# Patient Record
Sex: Female | Born: 1935
Health system: Southern US, Community
[De-identification: ages and names within clinical notes are randomized; demographics above are authoritative.]

## PROBLEM LIST (undated history)

## (undated) DIAGNOSIS — D649 Anemia, unspecified: Secondary | ICD-10-CM

## (undated) DIAGNOSIS — M1990 Unspecified osteoarthritis, unspecified site: Secondary | ICD-10-CM

## (undated) DIAGNOSIS — E785 Hyperlipidemia, unspecified: Secondary | ICD-10-CM

## (undated) DIAGNOSIS — I10 Essential (primary) hypertension: Secondary | ICD-10-CM

## (undated) DIAGNOSIS — K219 Gastro-esophageal reflux disease without esophagitis: Secondary | ICD-10-CM

## (undated) DIAGNOSIS — T7840XA Allergy, unspecified, initial encounter: Secondary | ICD-10-CM

## (undated) HISTORY — PX: APPENDECTOMY: SHX54

## (undated) HISTORY — DX: Allergy, unspecified, initial encounter: T78.40XA

## (undated) HISTORY — DX: Anemia, unspecified: D64.9

## (undated) HISTORY — DX: Essential (primary) hypertension: I10

## (undated) HISTORY — DX: Hyperlipidemia, unspecified: E78.5

## (undated) HISTORY — DX: Gastro-esophageal reflux disease without esophagitis: K21.9

## (undated) HISTORY — PX: BACK SURGERY: SHX140

---

## 1998-08-02 ENCOUNTER — Other Ambulatory Visit: Admission: RE | Admit: 1998-08-02 | Discharge: 1998-08-02 | Payer: Self-pay | Admitting: Family Medicine

## 1999-08-01 ENCOUNTER — Encounter: Admission: RE | Admit: 1999-08-01 | Discharge: 1999-08-01 | Payer: Self-pay | Admitting: Family Medicine

## 1999-08-01 ENCOUNTER — Encounter: Payer: Self-pay | Admitting: Family Medicine

## 1999-08-21 ENCOUNTER — Encounter: Payer: Self-pay | Admitting: Family Medicine

## 1999-08-21 ENCOUNTER — Encounter: Admission: RE | Admit: 1999-08-21 | Discharge: 1999-08-21 | Payer: Self-pay | Admitting: Family Medicine

## 2000-08-03 ENCOUNTER — Encounter: Admission: RE | Admit: 2000-08-03 | Discharge: 2000-08-03 | Payer: Self-pay | Admitting: Family Medicine

## 2000-08-03 ENCOUNTER — Encounter: Payer: Self-pay | Admitting: Family Medicine

## 2000-08-14 ENCOUNTER — Other Ambulatory Visit: Admission: RE | Admit: 2000-08-14 | Discharge: 2000-08-14 | Payer: Self-pay | Admitting: Family Medicine

## 2001-08-05 ENCOUNTER — Encounter: Admission: RE | Admit: 2001-08-05 | Discharge: 2001-08-05 | Payer: Self-pay | Admitting: Family Medicine

## 2001-08-05 ENCOUNTER — Encounter: Payer: Self-pay | Admitting: Family Medicine

## 2001-08-20 ENCOUNTER — Other Ambulatory Visit: Admission: RE | Admit: 2001-08-20 | Discharge: 2001-08-20 | Payer: Self-pay | Admitting: Family Medicine

## 2002-08-16 ENCOUNTER — Encounter: Payer: Self-pay | Admitting: Family Medicine

## 2002-08-16 ENCOUNTER — Encounter: Admission: RE | Admit: 2002-08-16 | Discharge: 2002-08-16 | Payer: Self-pay | Admitting: Family Medicine

## 2002-08-19 ENCOUNTER — Other Ambulatory Visit: Admission: RE | Admit: 2002-08-19 | Discharge: 2002-08-19 | Payer: Self-pay | Admitting: Family Medicine

## 2003-06-22 ENCOUNTER — Inpatient Hospital Stay (HOSPITAL_COMMUNITY): Admission: EM | Admit: 2003-06-22 | Discharge: 2003-06-25 | Payer: Self-pay | Admitting: Emergency Medicine

## 2003-06-23 ENCOUNTER — Encounter: Payer: Self-pay | Admitting: Internal Medicine

## 2003-08-17 ENCOUNTER — Encounter: Admission: RE | Admit: 2003-08-17 | Discharge: 2003-08-17 | Payer: Self-pay | Admitting: Family Medicine

## 2003-08-24 ENCOUNTER — Other Ambulatory Visit: Admission: RE | Admit: 2003-08-24 | Discharge: 2003-08-24 | Payer: Self-pay | Admitting: Family Medicine

## 2003-09-01 ENCOUNTER — Encounter: Payer: Self-pay | Admitting: Internal Medicine

## 2003-09-04 ENCOUNTER — Encounter: Admission: RE | Admit: 2003-09-04 | Discharge: 2003-09-04 | Payer: Self-pay | Admitting: Family Medicine

## 2003-09-30 ENCOUNTER — Emergency Department (HOSPITAL_COMMUNITY): Admission: EM | Admit: 2003-09-30 | Discharge: 2003-09-30 | Payer: Self-pay | Admitting: Emergency Medicine

## 2004-08-20 ENCOUNTER — Encounter: Admission: RE | Admit: 2004-08-20 | Discharge: 2004-08-20 | Payer: Self-pay | Admitting: Family Medicine

## 2004-09-05 ENCOUNTER — Other Ambulatory Visit: Admission: RE | Admit: 2004-09-05 | Discharge: 2004-09-05 | Payer: Self-pay | Admitting: Family Medicine

## 2005-03-31 ENCOUNTER — Encounter: Admission: RE | Admit: 2005-03-31 | Discharge: 2005-03-31 | Payer: Self-pay | Admitting: Family Medicine

## 2005-04-14 ENCOUNTER — Encounter: Admission: RE | Admit: 2005-04-14 | Discharge: 2005-04-14 | Payer: Self-pay | Admitting: Family Medicine

## 2005-05-02 ENCOUNTER — Encounter: Admission: RE | Admit: 2005-05-02 | Discharge: 2005-05-02 | Payer: Self-pay | Admitting: Family Medicine

## 2005-05-16 ENCOUNTER — Encounter: Admission: RE | Admit: 2005-05-16 | Discharge: 2005-05-16 | Payer: Self-pay | Admitting: Family Medicine

## 2005-08-22 ENCOUNTER — Encounter: Admission: RE | Admit: 2005-08-22 | Discharge: 2005-08-22 | Payer: Self-pay | Admitting: Family Medicine

## 2005-09-09 ENCOUNTER — Other Ambulatory Visit: Admission: RE | Admit: 2005-09-09 | Discharge: 2005-09-09 | Payer: Self-pay | Admitting: Family Medicine

## 2006-08-31 ENCOUNTER — Encounter: Admission: RE | Admit: 2006-08-31 | Discharge: 2006-08-31 | Payer: Self-pay | Admitting: Family Medicine

## 2006-10-12 ENCOUNTER — Other Ambulatory Visit: Admission: RE | Admit: 2006-10-12 | Discharge: 2006-10-12 | Payer: Self-pay | Admitting: Family Medicine

## 2007-09-01 ENCOUNTER — Encounter: Admission: RE | Admit: 2007-09-01 | Discharge: 2007-09-01 | Payer: Self-pay | Admitting: Family Medicine

## 2007-09-09 ENCOUNTER — Encounter: Admission: RE | Admit: 2007-09-09 | Discharge: 2007-09-09 | Payer: Self-pay | Admitting: Family Medicine

## 2008-06-28 ENCOUNTER — Encounter (INDEPENDENT_AMBULATORY_CARE_PROVIDER_SITE_OTHER): Payer: Self-pay | Admitting: *Deleted

## 2008-09-15 ENCOUNTER — Encounter: Admission: RE | Admit: 2008-09-15 | Discharge: 2008-09-15 | Payer: Self-pay | Admitting: Internal Medicine

## 2009-01-06 ENCOUNTER — Ambulatory Visit: Payer: Self-pay | Admitting: Diagnostic Radiology

## 2009-01-06 ENCOUNTER — Emergency Department (HOSPITAL_COMMUNITY): Admission: EM | Admit: 2009-01-06 | Discharge: 2009-01-07 | Payer: Self-pay | Admitting: Emergency Medicine

## 2009-01-06 ENCOUNTER — Encounter: Payer: Self-pay | Admitting: Emergency Medicine

## 2009-09-17 ENCOUNTER — Encounter: Admission: RE | Admit: 2009-09-17 | Discharge: 2009-09-17 | Payer: Self-pay | Admitting: Internal Medicine

## 2009-10-23 DIAGNOSIS — M069 Rheumatoid arthritis, unspecified: Secondary | ICD-10-CM | POA: Insufficient documentation

## 2009-10-23 DIAGNOSIS — Z8711 Personal history of peptic ulcer disease: Secondary | ICD-10-CM | POA: Insufficient documentation

## 2009-10-23 DIAGNOSIS — K219 Gastro-esophageal reflux disease without esophagitis: Secondary | ICD-10-CM | POA: Insufficient documentation

## 2009-10-23 DIAGNOSIS — Z862 Personal history of diseases of the blood and blood-forming organs and certain disorders involving the immune mechanism: Secondary | ICD-10-CM | POA: Insufficient documentation

## 2009-10-23 DIAGNOSIS — E785 Hyperlipidemia, unspecified: Secondary | ICD-10-CM | POA: Insufficient documentation

## 2009-10-23 DIAGNOSIS — R109 Unspecified abdominal pain: Secondary | ICD-10-CM | POA: Insufficient documentation

## 2009-10-26 ENCOUNTER — Ambulatory Visit: Payer: Self-pay | Admitting: Internal Medicine

## 2009-11-07 ENCOUNTER — Ambulatory Visit (HOSPITAL_COMMUNITY): Admission: RE | Admit: 2009-11-07 | Discharge: 2009-11-07 | Payer: Self-pay | Admitting: Internal Medicine

## 2009-11-07 ENCOUNTER — Ambulatory Visit: Payer: Self-pay | Admitting: Internal Medicine

## 2009-11-08 ENCOUNTER — Encounter: Payer: Self-pay | Admitting: Internal Medicine

## 2010-04-01 ENCOUNTER — Encounter: Payer: Self-pay | Admitting: Family Medicine

## 2010-04-09 NOTE — Letter (Signed)
Summary: Appt Reminder 2  [facility]  [address] [place], [place] [address]   Phone: [phone]  Fax: [phone]        November 07, 2009 MRN: [id number]    [facility] [address] [place], [place]  [address]    Dear Ms. Waguespack,   You have a return appointment with Dr. Lina Sar on Friday, 01/04/10 at 3:30 pm.  Please remember to bring a complete list of the medicines you are taking, your insurance card and your co-pay.  If you have to cancel or reschedule this appointment, please call before 5:00 pm the evening before to avoid a cancellation fee.  If you have any questions or concerns, please call [phone].    Sincerely,    Dottie Nelson-Smith CMA (AAMA)

## 2010-04-09 NOTE — Procedures (Signed)
Summary: Upper Endoscopy  Patient: Pamela Dunn Note: All result statuses are Final unless otherwise noted.  Tests: (1) Upper Endoscopy (EGD)   EGD Upper Endoscopy       DONE     [facility]     [address] [place], [place]  [address]           ENDOSCOPY PROCEDURE REPORT           PATIENT:  Pamela, Dunn  MR#:  [id number]     BIRTHDATE:  [birth year], 78 yrs. old  GENDER:  female           ENDOSCOPIST:  Hedwig Morton. Juanda Chance, MD     Referred by:  Pearson Grippe, M.D.           PROCEDURE DATE:  11/07/2009     PROCEDURE:  EGD with biopsy     ASA CLASS:  Class II     INDICATIONS:  abdominal pain hx of bleeding DU 2005, dyspepsia,     improved on Nexiem, chronic anemia,     hx ofsteroid dependent RA           MEDICATIONS:   Versed 5 mg, Fentanyl 50 mcg     TOPICAL ANESTHETIC:  Cetacaine Spray           DESCRIPTION OF PROCEDURE:   After the risks benefits and     alternatives of the procedure were thoroughly explained, informed     consent was obtained.  The  endoscope was introduced through the     mouth and advanced to the second portion of the duodenum, without     limitations.  The instrument was slowly withdrawn as the mucosa     was fully examined.     <<PROCEDUREIMAGES>>           The upper, middle, and distal third of the esophagus were     carefully inspected and no abnormalities were noted. The z-line     was well seen at the GEJ. The endoscope was pushed into the fundus     which was normal including a retroflexed view. The antrum,gastric     body, first and second part of the duodenum were unremarkable.     normal duodenum, several fundic gland polyps With standard     forceps, a biopsy was obtained and sent to pathology. gastric     antrum Bx to r/o H (see image1, image2, image3, image4, image5,     image6, image7, and image8).Pylori    Retroflexed views revealed     no abnormalities.    The scope was then withdrawn from the patient     and the procedure  completed.           COMPLICATIONS:  None           ENDOSCOPIC IMPRESSION:     1) Normal EGD     nothing to account for the abd. pain, s/p biopsies to r/o     H.Pylori     RECOMMENDATIONS:     1) Await pathology results     continue Nexiem 40 mg po prn     colonoscopy           REPEAT EXAM:  In 0 year(s) for.           ______________________________     Hedwig Morton. Juanda Chance, MD           CC:  n.     eSIGNED:   Hedwig Morton. Brodie at 11/07/2009 09:55 AM           Pamela Dunn, [id number]  Note: An exclamation mark (!) indicates a result that was not dispersed into the flowsheet. Document Creation Date: 11/07/2009 3:15 PM _______________________________________________________________________  (1) Order result status: Final Collection or observation date-time: 11/07/2009 09:19 Requested date-time:  Receipt date-time:  Reported date-time:  Referring Physician:   Ordering Physician: Lina Sar [phone]) Specimen Source:  Source: Launa Grill Order Number: [id number] Lab site:

## 2010-04-09 NOTE — Procedures (Signed)
Summary: COLON   Colonoscopy  Procedure date:  09/01/2003  Findings:      Location:  [facility].    Procedures Next Due Date:    Colonoscopy: 09/2013 Patient Name: Pamela Dunn, Pamela Dunn  Procedure Procedures: Colonoscopy CPT: 86578.  Personnel: Endoscopist: Dora L. Juanda Chance, MD.  Exam Location: Exam performed in Outpatient Clinic. Outpatient  Patient Consent: Procedure, Alternatives, Risks and Benefits discussed, consent obtained, from patient. Consent was obtained by the RN.  Indications  Evaluation of: Anemia with low ferritin. s/p UGI bleed due to gastric ulcer.  History  Current Medications: Patient is taking an non-steroidal medication. Patient is not currently taking Coumadin.  Pre-Exam Physical: Performed Sep 01, 2003. Cardio-pulmonary exam, Rectal exam, HEENT exam , Abdominal exam, Extremity exam, Neurological exam, Mental status exam WNL.  Exam Exam: Extent of exam reached: Cecum, extent intended: Cecum.  The cecum was identified by appendiceal orifice and IC valve. Colon retroflexion performed. Images taken. ASA Classification: I. Tolerance: good.  Monitoring: Pulse and BP monitoring, Oximetry used. Supplemental O2 given.  Colon Prep Used Miralax for colon prep. Prep results: good.  Sedation Meds: Patient assessed and found to be appropriate for moderate (conscious) sedation. Fentanyl 50 mcg. given IV. Versed 5 mg. given IV.  Findings NORMAL EXAM: Cecum.   Assessment Normal examination.  Comments: no polyps Events  Unplanned Interventions: No intervention was required.  Unplanned Events: There were no complications. Plans Patient Education: Patient given standard instructions for: Yearly hemoccult testing recommended. Patient instructed to get routine colonoscopy every 10 years.  Comments: follow up with dr Artis Flock Disposition: After procedure patient sent to recovery. After recovery patient sent home.   This report was created from  the original endoscopy report, which was reviewed and signed by the above listed endoscopist.

## 2010-04-09 NOTE — Letter (Signed)
Summary: Patient Notice- Polyp Results   Gastroenterology  [address] [place], [place] [address]   Phone: [phone]  Fax: [phone]        November 08, 2009 MRN: [id number]    [facility] [address] [place], [place]  [address]    Dear Pamela Dunn,  I am pleased to inform you that the colon polyp(s) removed during your recent colonoscopy was (were) found to be benign (no cancer detected) upon pathologic examination.The polyps were adenomatous ( premalignant)  I recommend you have a repeat colonoscopy examination in _3 years to look for recurrent polyps, as having colon polyps increases your risk for having recurrent polyps or even colon cancer in the future.  Should you develop new or worsening symptoms of abdominal pain, bowel habit changes or bleeding from the rectum or bowels, please schedule an evaluation with either your primary care physician or with me.  Additional information/recommendations:  _x_ No further action with gastroenterology is needed at this time. Please      follow-up with your primary care physician for your other healthcare      needs.  __ Please call [phone] to schedule a return visit to review your      situation.  __ Please keep your follow-up visit as already scheduled.  __ Continue treatment plan as outlined the day of your exam.  Please call us if you are having persistent problems or have questions about your condition that have not been fully answered at this time.  Sincerely,  Hart Carwin MD  This letter has been electronically signed by your physician.

## 2010-04-09 NOTE — Procedures (Signed)
Summary: Colonoscopy  Patient: Pamela Dunn Note: All result statuses are Final unless otherwise noted.  Tests: (1) Colonoscopy (COL)   COL Colonoscopy           DONE     [facility]     [address] [place], [place]  [address]           COLONOSCOPY PROCEDURE REPORT           PATIENT:  Pamela, Dunn  MR#:  [id number]     BIRTHDATE:  1931/04/22, 78 yrs. old  GENDER:  female     ENDOSCOPIST:  Hedwig Morton. Juanda Chance, MD     REF. BY:  Pearson Grippe, M.D.     PROCEDURE DATE:  11/07/2009     PROCEDURE:  Colonoscopy [address]     ASA CLASS:  Class II     INDICATIONS:  Abdominal pain f hx of GI cancer ( ? colon?) in     sister,     RLQ abd. pain     MEDICATIONS:   Versed 2 mg, Fentanyl 25 mcg           DESCRIPTION OF PROCEDURE:   After the risks benefits and     alternatives of the procedure were thoroughly explained, informed     consent was obtained.  Digital rectal exam was performed and     revealed no rectal masses.   The  endoscope was introduced through     the anus and advanced to the cecum, which was identified by both     the appendix and ileocecal valve, without limitations.  The     quality of the prep was excellent, using MiraLax.  The instrument     was then slowly withdrawn as the colon was fully examined.     <<PROCEDUREIMAGES>>           FINDINGS:  Two polyps were found in the cecum. 5 mm soft flat     polyp in the cecual pouch, The polyp was removed using cold biopsy     forceps. Polyp was snared without cautery. Retrieval was     unsuccessful (see image3, image4, image5, image6, image7, and     image9). snare polyp  A diminutive polyp was found in the sigmoid     colon (see image9). at 40 cm  Mild diverticulosis was found (see     image1).  other finding (see image2, image8, and image10).     Retroflexed views in the rectum revealed no abnormalities.    The     scope was then withdrawn from the patient and the procedure     completed.           COMPLICATIONS:   None     ENDOSCOPIC IMPRESSION:     1) Two polyps in the cecum     2) Diminutive polyp in the sigmoid colon     3) Mild diverticulosis     4) Other finding     RECOMMENDATIONS:     1) Await pathology results     Bentyl 10 mg po bid x 1 month for IBS     trial of Benefiber 1 tsp daily     OV 2 months     REPEAT EXAM:  In 5 - 7 year(s) for.           ______________________________     Hedwig Morton. Juanda Chance, MD           CC:  Pearson Grippe, MD           n.     Rosalie DoctorHedwig Morton. Brodie at 11/07/2009 10:14 AM           Pamela Dunn, [id number]  Note: An exclamation mark (!) indicates a result that was not dispersed into the flowsheet. Document Creation Date: 11/07/2009 10:16 AM _______________________________________________________________________  (1) Order result status: Final Collection or observation date-time: 11/07/2009 09:59 Requested date-time:  Receipt date-time:  Reported date-time:  Referring Physician:   Ordering Physician: Lina Sar [phone]) Specimen Source:  Source: Launa Grill Order Number: [id number] Lab site:   Appended Document: Colonoscopy Patient has been scheduled for follow up office visit on 01/04/10. Letter sent to patient. Dottie Nelson-Smith CMA Duncan Dull)  November 07, 2009 3:10 PM   Clinical Lists Changes  Medications: Added new medication of BENTYL 20 MG TABS (DICYCLOMINE HCL) Take 1 tablet by mouth two times a day x 1 month

## 2010-04-09 NOTE — Procedures (Signed)
Summary: EGD   EGD  Procedure date:  06/23/2003  Findings:      Location: [facility]   Patient Name: Pamela Dunn, Pamela Dunn MRN: [id number] Procedure Procedures: Panendoscopy (EGD) CPT: 43235.    with biopsy(s)/brushing(s). CPT: D1846139.    with electrocoagulation and/ or injection for bleeding. CPT: 43255.  Personnel: Endoscopist: Dora L. Juanda Chance, MD.  Referred By: Barbera Setters Artis Flock, MD.  Exam Location: Exam performed in Endoscopy Suite.  Patient Consent: Procedure, Alternatives, Risks and Benefits discussed, consent obtained,  Indications  Evaluation of: Anemia,  with low iron saturation.  Symptoms: Melena. Last bleeding episode 25-48 hours ago, documentation: other, see comments.  Comments: on physical exam pt had a melenic g+ stool History  Current Medications: Patient is taking a non-steroidal medication. Patient is not currently taking Coumadin.  Pre-Exam Physical: Performed Jun 23, 2003  Cardio-pulmonary exam, HEENT exam, Abdominal exam, Extremity exam, Neurological exam, Mental status exam WNL.  Exam Exam Info: Maximum depth of insertion Duodenum, intended Duodenum. Vocal cords visualized. Gastric retroflexion performed. Images taken. ASA Classification: II. Tolerance: good.  Sedation Meds: Fentanyl 75 mcg. given IV. Versed 5 mg. given IV.  Monitoring: BP and pulse monitoring done. Oximetry used. Supplemental O2 given  Fluoroscopy: Fluoroscopy was not used.  Findings - HIATAL HERNIA: Diaphragm 35 cm from mouth. Z-line/GE Junction 32 cm from mouth. 3 cms. in length. nonreducible. ICD9: Hernia, Hiatal: 553.3.Comments: with nonobstructing stricture.  - MUCOSAL ABNORMALITY: Body to Antrum. Erosions present. RUT done, results pending. ICD9: Gastritis, Acute: 535.00.  - ULCER: in Duodenal Apex at 9 o'clock. Minimum Size: 9 mm. Maximum size: 12 mm. Actively bleeding, oozing. The ulcer was washed with water. 60 cc of water were used. An image was taken. done,  results pending.  ICD9: Ulcer, Duodenal, Acute with Hemorrhage: 534.00.  - Injection: Duodenal Apex. Injected with Epinephrine 1:10000, 3 ccs. Four quadrants within 1 mm were injected. Outcome: successful. Comments: non evudence of obstruction,.   Assessment Abnormal examination, see findings above.  Diagnoses: 534.00: Ulcer, Duodenal, Acute with Hemorrhage.  553.3: Hernia, Hiatal.  535.00: Gastritis, Acute.   Comments: s/p control bleeding from a duodenal ulcer caused by NSAIDs Events  Unplanned Intervention: No unplanned interventions were required.  Unplanned Events: There were no complications. Plans Instructions: No aspirin or non-steroidal containing medications: indefinitely.  Medication(s): Await pathology. PPI: Pantoprazole/Protonix 40 mg BID, starting Jun 23, 2003   Comments: bowl rest, close observation of H/H Disposition: After procedure patient sent to recovery.   This report was created from the original endoscopy report, which was reviewed and signed by the above listed endoscopist.

## 2010-04-09 NOTE — Letter (Signed)
Summary: Patient [facility] Biopsy Results  [facility]  [address] [place], [place] [address]   Phone: [phone]  Fax: [phone]        November 08, 2009 MRN: [id number]    [facility] [address] [place], [place]  [address]    Dear Pamela Dunn,  I am pleased to inform you that the biopsies taken during your recent endoscopic examination did not show any evidence of cancer upon pathologic examination.Mild gastritis found on the biopsies  Additional information/recommendations:  __No further action is needed at this time.  Please follow-up with      your primary care physician for your other healthcare needs.  __ Please call [phone] to schedule a return visit to review      your condition.  _x_ Continue with the treatment plan as outlined on the day of your      exam.  .   Please call us if you are having persistent problems or have questions about your condition that have not been fully answered at this time.  Sincerely,  Hart Carwin MD  This letter has been electronically signed by your physician.

## 2010-04-09 NOTE — Assessment & Plan Note (Signed)
Summary: gerd, abd disconfort..em   History of Present Illness Visit Type: Initial Consult Primary GI MD: Lina Sar MD Primary Provider: Rolin Barry, MD Chief Complaint: Patient states that when she made her appt she was having some abdominal pain, belching and bloating she has since started taking Nexium once a day and she feels much better. She kept her appt just to make sure she did not need to be doing anything eles.  History of Present Illness:   This is a 77 year old white female with periumbilical abdominal pain occurring for several months. It is not associated with bowel movements. She was started on Nexium 40 mg daily by Dr. Selena Batten with complete resolution of her pain. She had some continued tenderness and discomfort in the right lower quadrant. A colonoscopy in June 2005 for iron deficiency anemia did not show any polyps. She did have diverticulosis. She also had an upper GI bleed in 2005 due to a duodenal ulcer. She required blood transfusions. She is now on chronic prednisone for osteoarthritis .She  has chronic anemia. She was on Prilosec 20 mg daily prior to starting on Nexium. She denies dysphagia or odynophagia. An upper endoscopy in 2005 showed a 3 cm hiatal hernia, gastritis and a duodenal ulcer. Her sister died of "stomach" cancer.   GI Review of Systems      Denies abdominal pain, acid reflux, belching, bloating, chest pain, dysphagia with liquids, dysphagia with solids, heartburn, loss of appetite, nausea, vomiting, vomiting blood, weight loss, and  weight gain.        Denies anal fissure, black tarry stools, change in bowel habit, constipation, diarrhea, diverticulosis, fecal incontinence, heme positive stool, hemorrhoids, irritable bowel syndrome, jaundice, light color stool, liver problems, rectal bleeding, and  rectal pain.    Current Medications (verified): 1)  Prednisone 2.5 Mg Tabs (Prednisone) .... Take 1 Tablet By Mouth Once Daily 2)  Tylenol Extra Strength 500 Mg  Tabs (Acetaminophen) .... Take 1 Tablet By Mouth Once A Day 3)  Visine 0.05 % Soln (Tetrahydrozoline Hcl) .... Use As Needed 4)  Vitamin D 2000 Unit Tabs (Cholecalciferol) .... Take 1 Tablet By Mouth Once A Day 5)  Crestor 10 Mg Tabs (Rosuvastatin Calcium) .... Take 1 Tablet By Mouth 2 Times Per Week 6)  Nexium 40 Mg Cpdr (Esomeprazole Magnesium) .... Take One By Mouth Once Daily 7)  Fish Oil 1000 Mg Caps (Omega-3 Fatty Acids) .... Take One By Mouth Once Daily  Allergies (verified): No Known Drug Allergies  Past History:  Past Medical History: Reviewed history from 10/23/2009 and no changes required. 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  The patient denies allergy/sinus, anemia, anxiety-new, blood in urine, breast changes/lumps, confusion, cough, coughing up blood, depression-new, fainting, fatigue, fever, headaches-new, hearing problems, heart murmur, heart rhythm changes, itching, menstrual pain, muscle pains/cramps, night sweats, nosebleeds, pregnancy symptoms, shortness of breath, skin rash, sleeping  problems, sore throat, swelling of feet/legs, swollen lymph glands, thirst - excessive , urination - excessive , urination changes/pain, vision changes, and voice change.         Pertinent positive and negative  review of systems were noted in the above HPI. All other ROS was otherwise negative.   Vital Signs:  Patient profile:   77 year old female Height:      63.5 inches Weight:      174.4 pounds BMI:     30.52 Pulse rate:   76 / minute Pulse rhythm:   regular BP sitting:   126 / 76  (left arm) Cuff size:   regular  Vitals Entered By: Harlow Mares CMA Duncan Dull) (October 26, 2009 2:50 PM)  Physical Exam  General:  alert, oriented, deformities of the hands and feet due to arthritis. Eyes:  PERRLA, no icterus. Mouth:  No deformity or lesions, dentition normal. Neck:  Supple; no masses or thyromegaly. Lungs:  Clear throughout to auscultation. Heart:  Regular rate and rhythm; no murmurs, rubs,  or bruits. Abdomen:  soft obese abdomen with normal active bowel sounds, tenderness in right lower quadrant, liver edge at costal margin, left lower and upper quadrants unremarkable. Rectal:  soft Hemoccult negative stool. Extremities:  deformities of the feet and hands secondary to arthritis. Skin:  no lesions. Psych:  Alert and cooperative. Normal mood and affect.   Impression & Recommendations:  Problem # 1:  ABDOMINAL PAIN, UNSPECIFIED SITE (ICD-789.00)  Patient has midabdominal pain with tenderness in the right lower quadrant consistent with  lower GI origin. She has also given [place] some new history of  her sister having  colon  cancer. It is not clear whether this was colon or gastric cancer. She apparently presented with an intestinal obstruction. She will continue on Nexium 40 mg daily and will be scheduled for an upper endoscopy due to her right lower quadrant discomfort and questionable family history of colon cancer. We will proceed with a colonoscopy to visualize the right colon as well.  Orders: [facility] (Col/End [place])  Problem # 2:  PEPTIC ULCER DISEASE, HX OF (ICD-V12.71)  Patient has a history of duodenal ulcers in 2005. We will schedule for her for an upper endoscopy at  this time.  Orders: [facility] (Col/End [place])  Patient Instructions: 1)  Upper endoscopy with H. pylori test, r/o recurrent DU 2)  Continue Nexium 40 mg daily, samples given. 3)  Colonoscopy at the time of endoscopy with attention to right lower quadrant. 4)  Copy sent to : Dr Selena Batten 5)  The medication list was reviewed and reconciled.  All changed / newly prescribed medications were explained.  A complete medication list was provided to the patient / caregiver. Prescriptions: DULCOLAX 5 MG  TBEC (BISACODYL) Day before procedure take 2 at 3pm and 2 at 8pm.  #4 x 0   Entered by:   Lamona Curl CMA (AAMA)   Authorized by:   Hart Carwin MD   Signed by:   Lamona Curl CMA (AAMA) on 10/26/2009   Method used:   Electronically to        [facility] [phone]* (retail)       [address], [place]  [address]       Ph: [phone] or [phone]       Fax: [phone]   RxID:   [id number] REGLAN 10 MG  TABS (METOCLOPRAMIDE HCL) As per prep  instructions.  #2 x 0   Entered by:   Lamona Curl CMA (AAMA)   Authorized by:   Hart Carwin MD   Signed by:   Lamona Curl CMA (AAMA) on 10/26/2009   Method used:   Electronically to        [facility] [phone]* (retail)       [address], [place]  [address]       Ph: [phone] or [phone]       Fax: [phone]   RxID:   [id number] MIRALAX   POWD (POLYETHYLENE GLYCOL 3350) As per prep  instructions.  #255gm x 0   Entered by:   Lamona Curl CMA (AAMA)   Authorized by:   Hart Carwin MD   Signed by:   Lamona Curl CMA (AAMA) on 10/26/2009   Method used:   Electronically to        [facility] [phone]* (retail)       [address]       [place], [place]  [address]       Ph: [phone] or [phone]       Fax: [phone]   RxID:   [id number]

## 2010-04-09 NOTE — Letter (Signed)
Summary: [facility] Instructions  [facility]  [address] [place], [place] [address]   Phone: [phone]  Fax: [phone]       Pamela Dunn    Nov 26, 1931    MRN: [id number]       Procedure Day Dorna Bloom: Wednesday 11/07/09     Arrival Time: 7:30 am     Procedure Time: 8:30 am     Location of Procedure:                     _x _  [facility] ( Outpatient Registration)  PREPARATION FOR COLONOSCOPY WITH MIRALAX  Starting 5 days prior to your procedure ( 12/04/09) do not eat nuts, seeds, popcorn, corn, beans, peas,  salads, or any raw vegetables.  Do not take any fiber supplements (e.g. Metamucil, Citrucel, and Benefiber). ____________________________________________________________________________________________________   THE DAY BEFORE YOUR PROCEDURE         DATE: 11/06/09 DAY: Tuesday  1   Drink clear liquids the entire day-NO SOLID FOOD  2   Do not drink anything colored red or purple.  Avoid juices with pulp.  No orange juice.  3   Drink at least 64 oz. (8 glasses) of fluid/clear liquids during the day to prevent dehydration and help the prep work efficiently.  CLEAR LIQUIDS INCLUDE: Water Jello Ice Popsicles Tea (sugar ok, no milk/cream) Powdered fruit flavored drinks Coffee (sugar ok, no milk/cream) Gatorade Juice: apple, white grape, white cranberry  Lemonade Clear bullion, consomm, broth Carbonated beverages (any kind) Strained chicken noodle soup Hard Candy  4   Mix the entire bottle of Miralax with 64 oz. of Gatorade/Powerade in the morning and put in the refrigerator to chill.  5   At 3:00 pm take 2 Dulcolax/Bisacodyl tablets.  6   At 4:30 pm take one Reglan/Metoclopramide tablet.  7  Starting at 5:00 pm drink one 8 oz glass of the Miralax mixture every 15-20 minutes until you have finished drinking the entire 64 oz.  You should finish drinking prep around 7:30 or 8:00 pm.  8   If you are nauseated, you may take the 2nd  Reglan/Metoclopramide tablet at 6:30 pm.        9    At 8:00 pm take 2 more DULCOLAX/Bisacodyl tablets.         THE DAY OF YOUR PROCEDURE      DATE:  11/07/09 DAY: Wednesday  You may drink clear liquids until 4:30 am  (4 HOURS BEFORE PROCEDURE).   MEDICATION INSTRUCTIONS  Unless otherwise instructed, you should take regular prescription medications with a small sip of water as early as possible the morning of your procedure.        OTHER INSTRUCTIONS  You will need a responsible adult at least 75 years of age to accompany you and drive you home.   This person must remain in the waiting room during your procedure.  Wear loose fitting clothing that is easily removed.  Leave jewelry and other valuables at home.  However, you may wish to bring a book to read or an iPod/MP3 player to listen to music as you wait for your procedure to start.  Remove all body piercing jewelry and leave at home.  Total time from sign-in until discharge is approximately 2-3 hours.  You should go home directly after your procedure and rest.  You can resume normal activities the day after your procedure.  The day of your procedure you should not:  Drive   Make legal decisions   Operate machinery   Drink alcohol   Return to work  You will receive specific instructions about eating, activities and medications before you leave.   The above instructions have been reviewed and explained to me by   [name]  October 26, 2009 3:34 PM     I fully understand and can verbalize these instructions _____________________________ Date 10/26/09

## 2010-04-09 NOTE — Procedures (Signed)
Summary: Prep/[facility]  Prep/[facility]   Imported By: Lester [place] 11/01/2009 09:04:44  _____________________________________________________________________  External Attachment:    Type:   Image     Comment:   External Document

## 2010-06-13 LAB — BASIC METABOLIC PANEL
CO2: 26 mEq/L (ref 19–32)
Calcium: 9.2 mg/dL (ref 8.4–10.5)
GFR calc Af Amer: 59 mL/min — ABNORMAL LOW (ref >60)
GFR calc non Af Amer: 48 mL/min — ABNORMAL LOW (ref >60)
Glucose, Bld: 106 mg/dL — ABNORMAL HIGH (ref 70–99)
Potassium: 3.8 mEq/L (ref 3.5–5.1)

## 2010-06-13 LAB — DIFFERENTIAL
Basophils Absolute: 0.1 10*3/uL (ref 0.0–0.1)
Eosinophils Absolute: 0.1 10*3/uL (ref 0.0–0.7)
Lymphocytes Relative: 9 % — ABNORMAL LOW (ref 12–46)
Lymphs Abs: 1.5 10*3/uL (ref 0.7–4.0)

## 2010-06-13 LAB — CBC
HCT: 36 % (ref 36.0–46.0)
Hemoglobin: 12.2 g/dL (ref 12.0–15.0)
MCV: 87.3 fL (ref 78.0–100.0)
Platelets: 279 10*3/uL (ref 150–400)
RDW: 14.4 % (ref 11.5–15.5)

## 2010-09-09 ENCOUNTER — Other Ambulatory Visit: Payer: Self-pay | Admitting: Internal Medicine

## 2010-09-09 DIAGNOSIS — Z1231 Encounter for screening mammogram for malignant neoplasm of breast: Secondary | ICD-10-CM

## 2010-09-20 ENCOUNTER — Ambulatory Visit
Admission: RE | Admit: 2010-09-20 | Discharge: 2010-09-20 | Disposition: A | Discharge status: Home / Self Care | Payer: Medicare Other | Source: Ambulatory Visit | Attending: Internal Medicine | Admitting: Internal Medicine

## 2010-09-20 DIAGNOSIS — Z1231 Encounter for screening mammogram for malignant neoplasm of breast: Secondary | ICD-10-CM

## 2011-09-25 ENCOUNTER — Other Ambulatory Visit: Payer: Self-pay | Admitting: Internal Medicine

## 2011-09-25 DIAGNOSIS — Z1231 Encounter for screening mammogram for malignant neoplasm of breast: Secondary | ICD-10-CM

## 2011-10-09 ENCOUNTER — Ambulatory Visit
Admission: RE | Admit: 2011-10-09 | Discharge: 2011-10-09 | Disposition: A | Discharge status: Home / Self Care | Payer: Medicare Other | Source: Ambulatory Visit | Attending: Internal Medicine | Admitting: Internal Medicine

## 2011-10-09 DIAGNOSIS — Z1231 Encounter for screening mammogram for malignant neoplasm of breast: Secondary | ICD-10-CM

## 2012-05-11 ENCOUNTER — Other Ambulatory Visit: Payer: Self-pay | Admitting: Internal Medicine

## 2012-05-11 DIAGNOSIS — M549 Dorsalgia, unspecified: Secondary | ICD-10-CM

## 2012-05-20 ENCOUNTER — Ambulatory Visit
Admission: RE | Admit: 2012-05-20 | Discharge: 2012-05-20 | Disposition: A | Discharge status: Home / Self Care | Payer: PRIVATE HEALTH INSURANCE | Source: Ambulatory Visit | Attending: Internal Medicine | Admitting: Internal Medicine

## 2012-05-20 DIAGNOSIS — M549 Dorsalgia, unspecified: Secondary | ICD-10-CM

## 2012-07-13 ENCOUNTER — Other Ambulatory Visit (HOSPITAL_COMMUNITY): Payer: Self-pay | Admitting: Internal Medicine

## 2012-07-13 DIAGNOSIS — M7989 Other specified soft tissue disorders: Secondary | ICD-10-CM

## 2012-07-13 DIAGNOSIS — R9430 Abnormal result of cardiovascular function study, unspecified: Secondary | ICD-10-CM

## 2012-07-14 ENCOUNTER — Ambulatory Visit (HOSPITAL_COMMUNITY)
Admission: RE | Admit: 2012-07-14 | Discharge: 2012-07-14 | Disposition: A | Discharge status: Home / Self Care | Payer: Medicare Other | Source: Ambulatory Visit | Attending: Cardiovascular Disease | Admitting: Cardiovascular Disease

## 2012-07-14 DIAGNOSIS — M7989 Other specified soft tissue disorders: Secondary | ICD-10-CM

## 2012-07-14 DIAGNOSIS — I10 Essential (primary) hypertension: Secondary | ICD-10-CM | POA: Insufficient documentation

## 2012-07-14 DIAGNOSIS — E669 Obesity, unspecified: Secondary | ICD-10-CM | POA: Insufficient documentation

## 2012-07-14 DIAGNOSIS — R609 Edema, unspecified: Secondary | ICD-10-CM | POA: Insufficient documentation

## 2012-07-14 DIAGNOSIS — E785 Hyperlipidemia, unspecified: Secondary | ICD-10-CM | POA: Insufficient documentation

## 2012-07-14 DIAGNOSIS — I079 Rheumatic tricuspid valve disease, unspecified: Secondary | ICD-10-CM | POA: Insufficient documentation

## 2012-07-14 DIAGNOSIS — R9430 Abnormal result of cardiovascular function study, unspecified: Secondary | ICD-10-CM

## 2012-07-14 DIAGNOSIS — I059 Rheumatic mitral valve disease, unspecified: Secondary | ICD-10-CM | POA: Insufficient documentation

## 2012-07-14 NOTE — Progress Notes (Signed)
Bilateral Venous Lower Ext. Completed. Negative DVT by tech. Chauncy Lean

## 2012-07-14 NOTE — Progress Notes (Signed)
[facility]   2D echo completed 07/14/2012.   Cindy Melisse Caetano, RDCS  

## 2012-09-03 ENCOUNTER — Other Ambulatory Visit: Payer: Self-pay

## 2012-09-03 DIAGNOSIS — Z1231 Encounter for screening mammogram for malignant neoplasm of breast: Secondary | ICD-10-CM

## 2012-10-12 ENCOUNTER — Encounter: Payer: Self-pay | Admitting: Internal Medicine

## 2012-10-14 ENCOUNTER — Ambulatory Visit
Admission: RE | Admit: 2012-10-14 | Discharge: 2012-10-14 | Disposition: A | Discharge status: Home / Self Care | Payer: Medicare Other | Source: Ambulatory Visit

## 2012-10-14 DIAGNOSIS — Z1231 Encounter for screening mammogram for malignant neoplasm of breast: Secondary | ICD-10-CM

## 2012-11-05 ENCOUNTER — Encounter: Payer: Self-pay | Admitting: Internal Medicine

## 2012-11-17 ENCOUNTER — Encounter (HOSPITAL_COMMUNITY): Payer: Self-pay | Admitting: Emergency Medicine

## 2012-11-17 ENCOUNTER — Emergency Department (HOSPITAL_COMMUNITY)
Admission: EM | Admit: 2012-11-17 | Discharge: 2012-11-17 | Disposition: A | Discharge status: Home / Self Care | Payer: Medicare Other | Attending: Emergency Medicine | Admitting: Emergency Medicine

## 2012-11-17 DIAGNOSIS — IMO0002 Reserved for concepts with insufficient information to code with codable children: Secondary | ICD-10-CM | POA: Insufficient documentation

## 2012-11-17 DIAGNOSIS — R609 Edema, unspecified: Secondary | ICD-10-CM | POA: Insufficient documentation

## 2012-11-17 DIAGNOSIS — S81009A Unspecified open wound, unspecified knee, initial encounter: Secondary | ICD-10-CM | POA: Insufficient documentation

## 2012-11-17 DIAGNOSIS — Z79899 Other long term (current) drug therapy: Secondary | ICD-10-CM | POA: Insufficient documentation

## 2012-11-17 DIAGNOSIS — Y92009 Unspecified place in unspecified non-institutional (private) residence as the place of occurrence of the external cause: Secondary | ICD-10-CM | POA: Insufficient documentation

## 2012-11-17 DIAGNOSIS — Y9389 Activity, other specified: Secondary | ICD-10-CM | POA: Insufficient documentation

## 2012-11-17 DIAGNOSIS — W268XXA Contact with other sharp object(s), not elsewhere classified, initial encounter: Secondary | ICD-10-CM | POA: Insufficient documentation

## 2012-11-17 DIAGNOSIS — M129 Arthropathy, unspecified: Secondary | ICD-10-CM | POA: Insufficient documentation

## 2012-11-17 DIAGNOSIS — S81811A Laceration without foreign body, right lower leg, initial encounter: Secondary | ICD-10-CM

## 2012-11-17 HISTORY — DX: Unspecified osteoarthritis, unspecified site: M19.90

## 2012-11-17 LAB — CBC WITH DIFFERENTIAL/PLATELET
Basophils Relative: 0 % (ref 0–1)
Eosinophils Absolute: 0.3 10*3/uL (ref 0.0–0.7)
Eosinophils Relative: 2 % (ref 0–5)
Hemoglobin: 11.6 g/dL — ABNORMAL LOW (ref 12.0–15.0)
Lymphs Abs: 2 10*3/uL (ref 0.7–4.0)
MCH: 30.1 pg (ref 26.0–34.0)
MCHC: 32.6 g/dL (ref 30.0–36.0)
MCV: 92.2 fL (ref 78.0–100.0)
Monocytes Absolute: 0.6 10*3/uL (ref 0.1–1.0)
Monocytes Relative: 5 % (ref 3–12)
RBC: 3.86 MIL/uL — ABNORMAL LOW (ref 3.87–5.11)

## 2012-11-17 MED ORDER — CEPHALEXIN 500 MG PO CAPS: 500.0000 mg | ORAL_CAPSULE | Freq: Three times a day (TID) | ORAL | Status: DC

## 2012-11-17 NOTE — ED Notes (Signed)
Pt c/o r leg injury and laceration  Reports she hit leg on recliner chair at 4am. Pt was seen by PCP and was instructed to come here.  Pt denies pain. Last tenus shot 2011

## 2012-11-17 NOTE — ED Provider Notes (Signed)
CSN: [id number]     Arrival date & time 11/17/12  1716 History   First MD Initiated Contact with Patient 11/17/12 1954     Chief Complaint  Patient presents with  . Leg Injury   (Consider location/radiation/quality/duration/timing/severity/associated sxs/prior Treatment) The history is provided by the patient.   patient with laceration to her right lower leg. Was done for the morning. States she saw her primary care Dr. today who told her to the ER. Last tetanus shot was a few years ago. States she lost a lot of blood. The lightheadedness dizziness. No numbness or weakness. No fevers. She has chronic swelling of her legs.  Past Medical History  Diagnosis Date  . Arthritis    Past Surgical History  Procedure Laterality Date  . Back surgery     History reviewed. No pertinent family history. History  Substance Use Topics  . Smoking status: Never Smoker   . Smokeless tobacco: Not on file  . Alcohol Use: No   OB History   Grav Para Term Preterm Abortions TAB SAB Ect Mult Living                 Review of Systems  Constitutional: Negative for activity change and appetite change.  HENT: Negative for neck stiffness.   Eyes: Negative for pain.  Respiratory: Negative for chest tightness and shortness of breath.   Cardiovascular: Positive for leg swelling. Negative for chest pain.  Gastrointestinal: Negative for nausea, vomiting, abdominal pain and diarrhea.  Genitourinary: Negative for flank pain.  Musculoskeletal: Negative for back pain.  Skin: Negative for rash.  Neurological: Negative for weakness, numbness and headaches.  Psychiatric/Behavioral: Negative for behavioral problems.    Allergies  Review of patient's allergies indicates no known allergies.  Home Medications   Current Outpatient Rx  Name  Route  Sig  Dispense  Refill  . acetaminophen (TYLENOL) 500 MG tablet   Oral   Take 1,000 mg by mouth every 6 (six) hours as needed for pain.         . cholecalciferol  (VITAMIN D) 1000 UNITS tablet   Oral   Take 2,000 Units by mouth daily.         . Coenzyme Q10 (COQ10) 200 MG CAPS   Oral   Take 200 mg by mouth daily.         [name] Kitchen esomeprazole (NEXIUM) 40 MG capsule   Oral   Take 40 mg by mouth daily before breakfast.         . FOLIC ACID PO   Oral   Take 1 tablet by mouth daily.         [name] Kitchen KRILL OIL PO   Oral   Take 1 tablet by mouth daily.         . predniSONE (DELTASONE) 2.5 MG tablet   Oral   Take 2.5 mg by mouth daily.         . Tetrahydrozoline HCl (VISINE OP)   Both Eyes   Place 1 drop into both eyes daily.         . cephALEXin (KEFLEX) 500 MG capsule   Oral   Take 1 capsule (500 mg total) by mouth 3 (three) times daily.   15 capsule   0    BP 160/85  Pulse 101  Temp(Src) 98.2 F (36.8 C) (Oral)  Resp 18  SpO2 97% Physical Exam  Nursing note and vitals reviewed. Constitutional: She is oriented to person, place, and time. She appears well-developed and  well-nourished.  HENT:  Head: Normocephalic and atraumatic.  Eyes: EOM are normal. Pupils are equal, round, and reactive to light.  Neck: Normal range of motion. Neck supple.  Cardiovascular: Normal rate, regular rhythm and normal heart sounds.   No murmur heard. Pulmonary/Chest: Effort normal and breath sounds normal. No respiratory distress. She has no wheezes. She has no rales.  Abdominal: Soft. Bowel sounds are normal. She exhibits no distension. There is no tenderness. There is no rebound and no guarding.  Musculoskeletal: Normal range of motion.  Edema to bilateral lower extremities. Mild erythema. 7 cm curved laceration to right lower leg anteriorly. Visible fat. Neurovascular intact distally with good pulses.  Neurological: She is alert and oriented to person, place, and time. No cranial nerve deficit.  Skin: Skin is warm and dry.  Psychiatric: She has a normal mood and affect. Her speech is normal.    ED Course  Procedures (including critical care  time) Labs Review Labs Reviewed  CBC WITH DIFFERENTIAL - Abnormal; Notable for the following:    WBC 11.9 (*)    RBC 3.86 (*)    Hemoglobin 11.6 (*)    HCT 35.6 (*)    RDW 15.6 (*)    Neutro Abs 9.0 (*)    All other components within normal limits   Imaging Review No results found.  MDM   1. Laceration of lower leg, right, initial encounter    Patient was laceration to lower leg. She is somewhat high risk for infection, however is a large defect and I think the risk of the closing his lower than the risk of having large wound on her lower leg. The wound was loosely approximated after copious irrigation. Patient be given prophylactic antibiotics and will follow with her primary care doctor. She'll return for signs of infection.    Juliet Rude. Rubin Payor, MD 11/18/12 [phone]

## 2012-11-17 NOTE — ED Notes (Signed)
Patient states she hit her leg on a piece of metal on a recliner. Patient states she went to her PCP and was referred here for further evaluation. Patient denies pain at this time, A&Ox4 in NAD.

## 2012-12-15 ENCOUNTER — Encounter (HOSPITAL_BASED_OUTPATIENT_CLINIC_OR_DEPARTMENT_OTHER): Payer: Medicare Other | Attending: General Surgery

## 2012-12-15 DIAGNOSIS — M069 Rheumatoid arthritis, unspecified: Secondary | ICD-10-CM | POA: Insufficient documentation

## 2012-12-15 DIAGNOSIS — S81009A Unspecified open wound, unspecified knee, initial encounter: Secondary | ICD-10-CM | POA: Insufficient documentation

## 2012-12-15 DIAGNOSIS — X58XXXA Exposure to other specified factors, initial encounter: Secondary | ICD-10-CM | POA: Insufficient documentation

## 2012-12-16 NOTE — H&P (Signed)
NAME:  Pamela Dunn, Pamela Dunn NO.:  [id number]  MEDICAL RECORD NO.:  [id number]  LOCATION:  FOOT                         FACILITY:  [facility]  PHYSICIAN:  Ardath Sax, M.D.     DATE OF BIRTH:  04/11/1931  DATE OF ADMISSION:  12/15/2012 DATE OF DISCHARGE:                             HISTORY & PHYSICAL   HISTORY OF PRESENT ILLNESS:  This is a 79 year old Caucasian lady, who comes to [place] for evaluation of a traumatic wound suffered several weeks ago after a fall.  She had bilateral contusions and lacerations of her lower legs and her left hand, which were sutured in the emergency room. Since that time, the skin has sloughed and she has open wounds on both of her legs and her dorsal aspect of her left hand.  Today, I debrided all the dead skin from all these wounds and took out the remaining sutures, which were not doing any good at all.  This lady appears quite fragile.  She has had rheumatoid arthritis for years and takes prednisone 2.5 mg a day.  She also has gastroesophageal reflux.  She has allergic rhinitis.  She has had a peptic ulcer in the past.  She also has spondylolisthesis of her spine.  She has had a left shoulder replacement and she also had a cervical fusion.  MEDICATIONS:  She is on Nexium, Keflex, prednisone, vitamins, Lasix, Ativan, omega-3, vitamin D, and Livalo 2 mg 3 times a week.  Today.  PHYSICAL EXAMINATION:  VITAL SIGNS:  She had a blood pressure of 136/79, respirations 17, pulse 100, temperature 98.  She weighs 160 pounds.  ASSESSMENT/PLAN:  She will be treated with Santyl.  We will continue the antibiotics, I put a Mepilex dressing on the left hand with Santyl and on the legs.  We wrapped her with a Kerlix.  She will come back here in a week.     Ardath Sax, M.D.     PP/MEDQ  D:  12/15/2012  T:  12/16/2012  Job:  [id number]

## 2012-12-20 NOTE — ED Provider Notes (Signed)
  Physical Exam  BP 160/85  Pulse 101  Temp(Src) 98.2 F (36.8 C) (Oral)  Resp 18  SpO2 97%  Physical Exam  ED Course  Procedures  LACERATION REPAIR Performed by: Carlyle Dolly Authorized by: Carlyle Dolly Consent: Verbal consent obtained. Risks and benefits: risks, benefits and alternatives were discussed Consent given by: patient Patient identity confirmed: provided demographic data Prepped and Draped in normal sterile fashion Wound explored  Laceration Location: R lower leg  Laceration Length: 9 cm  No Foreign Bodies seen or palpated  Anesthesia: local infiltration  Local anesthetic: lidocaine 2% w epinephrine  Anesthetic total: 9 ml  Irrigation method: syringe Amount of cleaning: standard  Skin closure:  4-0 Prolene  Number of sutures: 9  Technique: Loose approximation due to the length of time that the wound had been open  But simple interrupted   Patient tolerance: Patient tolerated the procedure well with no immediate complications.       Carlyle Dolly, PA-C 12/20/12 [phone]

## 2012-12-20 NOTE — ED Provider Notes (Signed)
Medical screening examination/treatment/procedure(s) were performed by non-physician practitioner and as supervising physician I was immediately available for consultation/collaboration.  Juliet Rude. Rubin Payor, MD 12/20/12 1511

## 2013-01-12 ENCOUNTER — Encounter (HOSPITAL_BASED_OUTPATIENT_CLINIC_OR_DEPARTMENT_OTHER): Payer: Medicare Other | Attending: General Surgery

## 2013-01-12 DIAGNOSIS — L97809 Non-pressure chronic ulcer of other part of unspecified lower leg with unspecified severity: Secondary | ICD-10-CM | POA: Insufficient documentation

## 2013-01-12 DIAGNOSIS — I87309 Chronic venous hypertension (idiopathic) without complications of unspecified lower extremity: Secondary | ICD-10-CM | POA: Insufficient documentation

## 2013-01-19 ENCOUNTER — Encounter: Payer: Medicare Other | Admitting: Internal Medicine

## 2013-02-09 ENCOUNTER — Encounter (HOSPITAL_BASED_OUTPATIENT_CLINIC_OR_DEPARTMENT_OTHER): Payer: Medicare Other | Attending: General Surgery

## 2013-02-09 DIAGNOSIS — I87319 Chronic venous hypertension (idiopathic) with ulcer of unspecified lower extremity: Secondary | ICD-10-CM | POA: Insufficient documentation

## 2013-02-09 DIAGNOSIS — L97909 Non-pressure chronic ulcer of unspecified part of unspecified lower leg with unspecified severity: Secondary | ICD-10-CM | POA: Insufficient documentation

## 2013-03-16 ENCOUNTER — Encounter (HOSPITAL_BASED_OUTPATIENT_CLINIC_OR_DEPARTMENT_OTHER): Payer: Medicare Other | Attending: General Surgery

## 2013-03-16 DIAGNOSIS — L97909 Non-pressure chronic ulcer of unspecified part of unspecified lower leg with unspecified severity: Principal | ICD-10-CM

## 2013-03-16 DIAGNOSIS — M069 Rheumatoid arthritis, unspecified: Secondary | ICD-10-CM | POA: Insufficient documentation

## 2013-03-16 DIAGNOSIS — I87339 Chronic venous hypertension (idiopathic) with ulcer and inflammation of unspecified lower extremity: Secondary | ICD-10-CM | POA: Insufficient documentation

## 2013-04-13 ENCOUNTER — Encounter (HOSPITAL_BASED_OUTPATIENT_CLINIC_OR_DEPARTMENT_OTHER): Payer: Medicare Other | Attending: General Surgery

## 2013-04-13 DIAGNOSIS — L97809 Non-pressure chronic ulcer of other part of unspecified lower leg with unspecified severity: Secondary | ICD-10-CM | POA: Insufficient documentation

## 2013-05-09 ENCOUNTER — Encounter: Payer: Self-pay | Admitting: Internal Medicine

## 2013-06-22 ENCOUNTER — Ambulatory Visit (AMBULATORY_SURGERY_CENTER): Payer: Self-pay | Admitting: *Deleted

## 2013-06-22 VITALS — Ht 62.0 in | Wt 156.0 lb

## 2013-06-22 DIAGNOSIS — Z86010 Personal history of colonic polyps: Secondary | ICD-10-CM

## 2013-06-22 MED ORDER — MOVIPREP 100 G PO SOLR: 1.0000 | Freq: Once | ORAL | Status: DC

## 2013-06-22 NOTE — Progress Notes (Signed)
No egg or soy allergy. No anesthesia problems.  No home O2.  No diet meds.  

## 2013-07-06 ENCOUNTER — Ambulatory Visit (AMBULATORY_SURGERY_CENTER): Payer: Medicare Other | Admitting: Internal Medicine

## 2013-07-06 ENCOUNTER — Encounter: Payer: Self-pay | Admitting: Internal Medicine

## 2013-07-06 VITALS — BP 157/80 | HR 80 | Temp 98.9°F | Resp 27 | Ht 62.0 in | Wt 156.0 lb

## 2013-07-06 DIAGNOSIS — Z800 Family history of malignant neoplasm of digestive organs: Secondary | ICD-10-CM

## 2013-07-06 DIAGNOSIS — Z86010 Personal history of colonic polyps: Secondary | ICD-10-CM

## 2013-07-06 MED ORDER — SODIUM CHLORIDE 0.9 % IV SOLN: 500.0000 mL | INTRAVENOUS | Status: DC

## 2013-07-06 NOTE — Op Note (Signed)
[facility] [address] [place], [address]   COLONOSCOPY PROCEDURE REPORT  PATIENT: Pamela Dunn, Pamela Dunn  MR#: [id number] BIRTHDATE: Jan 14, 1932 , 81  yrs. old GENDER: Female ENDOSCOPIST: Hart Carwin, MD REFERRED BY: Dr Pearson Grippe PROCEDURE DATE:  07/06/2013 PROCEDURE:   Colonoscopy, surveillance First Screening Colonoscopy - Avg.  risk and is 50 yrs.  old or older - No.  Prior Negative Screening - Now for repeat screening. N/A  History of Adenoma - Now for follow-up colonoscopy & has been > or = to 3 yrs.  N/A  Polyps Removed Today? No.  Recommend repeat exam, <10 yrs? No. ASA CLASS:   Class II INDICATIONS:last colonoscopy in August 2011.  Several adenomatous polyps removed.  Positive family history of colon cancer in one sister and one brother. MEDICATIONS: MAC sedation, administered by CRNA and Propofol (Diprivan) 180 mg IV  DESCRIPTION OF PROCEDURE:   After the risks benefits and alternatives of the procedure were thoroughly explained, informed consent was obtained.  A digital rectal exam revealed no abnormalities of the rectum.   The LB [id number]  endoscope was introduced through the anus and advanced to the cecum, which was identified by both the appendix and ileocecal valve. No adverse events experienced.   The quality of the prep was good, using MoviPrep  The instrument was then slowly withdrawn as the colon was fully examined.      COLON FINDINGS: A normal appearing cecum, ileocecal valve, and appendiceal orifice were identified.  The ascending, hepatic flexure, transverse, splenic flexure, descending, sigmoid colon and rectum appeared unremarkable.  No polyps or cancers were seen. Retroflexed views revealed no abnormalities. The time to cecum=344 minutes 44 seconds.  Withdrawal time=11 minutes 21 seconds.  The scope was withdrawn and the procedure completed. COMPLICATIONS: There were no complications.  ENDOSCOPIC IMPRESSION: Normal colon  ,no recurrent polyps  RECOMMENDATIONS: high fiber diet No recall  due to age Consider genetic testing for colon cancer   eSigned:  Hart Carwin, MD 07/06/2013 12:14 PM   cc:   PATIENT NAME:  Pattiann, Solanki MR#: [id number]

## 2013-07-06 NOTE — Patient Instructions (Signed)
YOU HAD AN ENDOSCOPIC PROCEDURE TODAY AT THE [facility]: Refer to the procedure report that was given to you for any specific questions about what was found during the examination.  If the procedure report does not answer your questions, please call your gastroenterologist to clarify.  If you requested that your care partner not be given the details of your procedure findings, then the procedure report has been included in a sealed envelope for you to review at your convenience later.  YOU SHOULD EXPECT: Some feelings of bloating in the abdomen. Passage of more gas than usual.  Walking can help get rid of the air that was put into your GI tract during the procedure and reduce the bloating. If you had a lower endoscopy (such as a colonoscopy or flexible sigmoidoscopy) you may notice spotting of blood in your stool or on the toilet paper. If you underwent a bowel prep for your procedure, then you may not have a normal bowel movement for a few days.  DIET: Your first meal following the procedure should be a light meal and then it is ok to progress to your normal diet.  A half-sandwich or bowl of soup is an example of a good first meal.  Heavy or fried foods are harder to digest and may make you feel nauseous or bloated.  Likewise meals heavy in dairy and vegetables can cause extra gas to form and this can also increase the bloating.  Drink plenty of fluids but you should avoid alcoholic beverages for 24 hours.  ACTIVITY: Your care partner should take you home directly after the procedure.  You should plan to take it easy, moving slowly for the rest of the day.  You can resume normal activity the day after the procedure however you should NOT DRIVE or use heavy machinery for 24 hours (because of the sedation medicines used during the test).    SYMPTOMS TO REPORT IMMEDIATELY: A gastroenterologist can be reached at any hour.  During normal business hours, 8:30 AM to 5:00 PM Monday through Friday,  call [phone].  After hours and on weekends, please call the GI answering service at [phone] who will take a message and have the physician on call contact you.   Following lower endoscopy (colonoscopy or flexible sigmoidoscopy):  Excessive amounts of blood in the stool  Significant tenderness or worsening of abdominal pains  Swelling of the abdomen that is new, acute  Fever of 100F or higher   FOLLOW UP: Our staff will call the home number listed on your records the next business day following your procedure to check on you and address any questions or concerns that you may have at that time regarding the information given to you following your procedure. This is a courtesy call and so if there is no answer at the home number and we have not heard from you through the emergency physician on call, we will assume that you have returned to your regular daily activities without incident.  SIGNATURES/CONFIDENTIALITY: You and/or your care partner have signed paperwork which will be entered into your electronic medical record.  These signatures attest to the fact that that the information above on your After Visit Summary has been reviewed and is understood.  Full responsibility of the confidentiality of this discharge information lies with you and/or your care-partner.  Follow high fiber diet- see handout  Continue your normal medications  Dr. Regino Schultze nurse will contact you with further information about genetic testing

## 2013-07-06 NOTE — Progress Notes (Signed)
Report to pacu rn, vss, bbs=clear 

## 2013-07-07 ENCOUNTER — Telehealth: Payer: Self-pay

## 2013-07-07 NOTE — Telephone Encounter (Signed)
  Follow up Call-  Call back number 07/06/2013  Post procedure Call Back phone  # [phone]  Permission to leave phone message Yes     Patient questions:  Do you have a fever, pain , or abdominal swelling? no Pain Score  0 *  Have you tolerated food without any problems? yes  Have you been able to return to your normal activities? yes  Do you have any questions about your discharge instructions: Diet   no Medications  no Follow up visit  no  Do you have questions or concerns about your Care? no  Actions: * If pain score is 4 or above: No action needed, pain <4.  No problems per the pt. maw

## 2013-08-31 ENCOUNTER — Telehealth: Payer: Self-pay | Admitting: Internal Medicine

## 2013-08-31 NOTE — Telephone Encounter (Signed)
Please send Zofran 4 mg, #10 1 po q 8 hours prn nausea, no refill)

## 2013-08-31 NOTE — Telephone Encounter (Signed)
Spoke with patient and she is having episodes of vomiting after eating. Does not happen all the time just off and on. Also reports left lower side pain. She is not taking pain medication or any new medications. She does report she has back pain and she thought that was causing some of her problems.  She has not taken anything for nausea/vomiting.She does take Nexium daily and will try taking it BID until OV. Scheduled with Mike Gip, PA on 09/05/13 at 9:30 AM.

## 2013-09-01 MED ORDER — ONDANSETRON HCL 4 MG PO TABS: 4.0000 mg | ORAL_TABLET | Freq: Three times a day (TID) | ORAL | Status: DC | PRN

## 2013-09-01 NOTE — Telephone Encounter (Signed)
Patient notified of recommendation. Rx sent to pharmacy. 

## 2013-09-05 ENCOUNTER — Other Ambulatory Visit (INDEPENDENT_AMBULATORY_CARE_PROVIDER_SITE_OTHER): Payer: Medicare Other

## 2013-09-05 ENCOUNTER — Ambulatory Visit (INDEPENDENT_AMBULATORY_CARE_PROVIDER_SITE_OTHER): Payer: Medicare Other | Admitting: Physician Assistant

## 2013-09-05 ENCOUNTER — Encounter: Payer: Self-pay | Admitting: Physician Assistant

## 2013-09-05 VITALS — BP 134/60 | HR 110 | Ht 62.0 in | Wt 156.0 lb

## 2013-09-05 DIAGNOSIS — R109 Unspecified abdominal pain: Secondary | ICD-10-CM

## 2013-09-05 DIAGNOSIS — R110 Nausea: Secondary | ICD-10-CM

## 2013-09-05 DIAGNOSIS — Z86010 Personal history of colonic polyps: Secondary | ICD-10-CM

## 2013-09-05 DIAGNOSIS — M48061 Spinal stenosis, lumbar region without neurogenic claudication: Secondary | ICD-10-CM

## 2013-09-05 DIAGNOSIS — M519 Unspecified thoracic, thoracolumbar and lumbosacral intervertebral disc disorder: Secondary | ICD-10-CM

## 2013-09-05 LAB — CBC WITH DIFFERENTIAL/PLATELET
BASOS ABS: 0 10*3/uL (ref 0.0–0.1)
Basophils Relative: 0.3 % (ref 0.0–3.0)
Eosinophils Absolute: 0.7 10*3/uL (ref 0.0–0.7)
Eosinophils Relative: 5.1 % — ABNORMAL HIGH (ref 0.0–5.0)
HEMATOCRIT: 34.7 % — AB (ref 36.0–46.0)
Hemoglobin: 11.5 g/dL — ABNORMAL LOW (ref 12.0–15.0)
LYMPHS ABS: 1.1 10*3/uL (ref 0.7–4.0)
Lymphocytes Relative: 8.1 % — ABNORMAL LOW (ref 12.0–46.0)
MCHC: 33.2 g/dL (ref 30.0–36.0)
MCV: 88.3 fl (ref 78.0–100.0)
MONOS PCT: 5.7 % (ref 3.0–12.0)
Monocytes Absolute: 0.8 10*3/uL (ref 0.1–1.0)
Neutro Abs: 11 10*3/uL — ABNORMAL HIGH (ref 1.4–7.7)
Neutrophils Relative %: 80.8 % — ABNORMAL HIGH (ref 43.0–77.0)
Platelets: 321 10*3/uL (ref 150.0–400.0)
RBC: 3.94 Mil/uL (ref 3.87–5.11)
RDW: 15.4 % (ref 11.5–15.5)
WBC: 13.6 10*3/uL — ABNORMAL HIGH (ref 4.0–10.5)

## 2013-09-05 LAB — COMPREHENSIVE METABOLIC PANEL
ALK PHOS: 54 U/L (ref 39–117)
ALT: 15 U/L (ref 0–35)
AST: 25 U/L (ref 0–37)
Albumin: 3.6 g/dL (ref 3.5–5.2)
BUN: 17 mg/dL (ref 6–23)
CO2: 29 meq/L (ref 19–32)
Calcium: 9.2 mg/dL (ref 8.4–10.5)
Chloride: 103 mEq/L (ref 96–112)
Creatinine, Ser: 1.1 mg/dL (ref 0.4–1.2)
GFR: 51.35 mL/min — AB (ref >60.00)
Glucose, Bld: 89 mg/dL (ref 70–99)
Potassium: 4.2 mEq/L (ref 3.5–5.1)
Sodium: 140 mEq/L (ref 135–145)
TOTAL PROTEIN: 7.1 g/dL (ref 6.0–8.3)
Total Bilirubin: 0.6 mg/dL (ref 0.2–1.2)

## 2013-09-05 MED ORDER — ESOMEPRAZOLE MAGNESIUM 40 MG PO CPDR: 40.0000 mg | DELAYED_RELEASE_CAPSULE | Freq: Every day | ORAL | Status: DC

## 2013-09-05 MED ORDER — ONDANSETRON HCL 4 MG PO TABS: ORAL_TABLET | ORAL | Status: DC

## 2013-09-05 NOTE — Progress Notes (Signed)
Subjective:    Patient ID: Pamela Dunn, female    DOB: Feb 15, 1932, 80 y.o.   MRN: [id number]  HPI  Pamela Dunn  is a pleasant 80 year old female known to Dr. Delfin Dunn from previous colonoscopies. She has family history of colon cancer and personal history of adenomatous polyps. She also had remote history of peptic ulcer disease with a bleed. Last EGD was done in August of 2011 and was normal, last colonoscopy April 2015 done for followup of polyps was normal.. She comes in today with complaints of nausea and episodes of vomiting. She says that she has been having terrible back pain and that when her back pain is severe it causes her to become nauseated and she has been having intermittent vomiting. He says she's been having severe problems with pain in her back over the past 2 years this is primarily left-sided in her lower back and radiates around into the left hip area and left groin. She says walking aggravates the pain and sitting down seems to completely relieve the pain. She had called her last week with complaints of nausea and was called in a prescription for Zofran. She says she took one this morning and hasn't been nauseated. Her appetite has been okay her weight has gone down a bit over the past couple of years. She has been seen by her rheumatologist and had an injection in her back a few months ago which did not seem to help. She's not been seen by orthopedics in a few years. Patient has no complaints of changes in her bowel habits melena or hematochezia. Patient had an MRI of her spine done in 2014 and this showed multilevel lumbar disc disease with a herniated disc in the L5-S1 and significant changes of spinal stenosis as well.    Review of Systems  Constitutional: Positive for activity change.  HENT: Negative.   Eyes: Negative.   Respiratory: Negative.   Gastrointestinal: Positive for nausea.  Endocrine: Negative.   Genitourinary: Negative.   Musculoskeletal: Positive for  arthralgias, back pain and joint swelling.  Allergic/Immunologic: Negative.   Neurological: Negative.   Hematological: Negative.   Psychiatric/Behavioral: Negative.    Outpatient Prescriptions Prior to Visit  Medication Sig Dispense Refill  . Aspirin-Caffeine (BAYER BACK & BODY PAIN EX ST) 500-32.5 MG TABS Take by mouth.      . cetirizine (ZYRTEC) 10 MG tablet Take 10 mg by mouth daily.      . cholecalciferol (VITAMIN D) 1000 UNITS tablet Take 2,000 Units by mouth daily.      . Coenzyme Q10 (COQ10) 200 MG CAPS Take 200 mg by mouth daily.      [name] Kitchen FOLIC ACID PO Take 1 tablet by mouth daily.      [name] Kitchen HYDROcodone-acetaminophen (NORCO/VICODIN) 5-325 MG per tablet       . Krill Oil 300 MG CAPS Take by mouth.      [name] Kitchen L-Methylfolate-B6-B12 (METANX PO) Take by mouth.      . Pitavastatin Calcium (LIVALO) 2 MG TABS Take by mouth 2 (two) times a week.      . predniSONE (DELTASONE) 2.5 MG tablet Take 2.5 mg by mouth daily.      . Tetrahydrozoline HCl (VISINE OP) Place 1 drop into both eyes daily.      [name] Kitchen esomeprazole (NEXIUM) 40 MG capsule Take 40 mg by mouth daily before breakfast.      . ondansetron (ZOFRAN) 4 MG tablet Take 1 tablet (4 mg total) by mouth every 8 (  eight) hours as needed for nausea or vomiting.  10 tablet  0   No facility-administered medications prior to visit.   No Known Allergies Patient Active Problem List   Diagnosis Date Noted  . Hx of adenomatous colonic polyps 09/05/2013  . Spinal stenosis of lumbar region 09/05/2013  . Lumbar disc disease 09/05/2013  . HYPERLIPIDEMIA 10/23/2009  . GERD 10/23/2009  . ARTHRITIS, RHEUMATOID 10/23/2009  . ABDOMINAL PAIN, UNSPECIFIED SITE 10/23/2009  . ANEMIA, HX OF 10/23/2009  . PEPTIC ULCER DISEASE, HX OF 10/23/2009   History  Substance Use Topics  . Smoking status: Never Smoker   . Smokeless tobacco: Never Used  . Alcohol Use: No   family history is not on file.     Objective:   Physical Exam  well-developed elderly white female  in no acute distress, pleasant blood pressure 134/60 pulse 110-5 foot 2 weight 156. HEENT; nontraumatic normocephalic EOMI PERRLA sclera anicteric, Supple ;no JVD, Cardiovascular; regular rate and rhythm with S1-S2 no murmur or gallop, Ulnar clear bilaterally, Abdomen; is soft basically nontender, there is no palpable mass or hepatosplenomegaly no guarding or rebound, Rectal ;exam not done, Extremities; she has advanced rheumatoid arthritic changes, Neuro/ psych; mood and affect normal and appropriate        Assessment & Plan:  #62  80 year old female with nausea which she very distinctly relates to severe back pain which is worse with walking and better with sitting. Her back pain is primarily left-sided and does radiate around into the left hip and groin area. I do not think that she has a primary GI problem but rather pain induced nausea #2 Remote history of peptic ulcer disease #3 history of adenomatous colon polyps no polyps on recent colonoscopy done April 2015 #4 rheumatoid arthritis #5 multilevel lumbar disc disease and spinal stenosis Plan; CBC with differential and see met today Continue Nexium 40 mg by mouth every morning Patient may continue to use Zofran 4 mg every 6-8 hours as needed for nausea. I encouraged her to take a Zofran every morning and this hopefully will last for further her most of the day We'll obtain CT scan of the abdomen and pelvis to exclude any intra-abdominal etiologies for her nausea Have asked her to return to her PCP to discuss referral to orthopedist for further evaluation of her severe lumbar back pain. She also had not been taking an analgesic on a regular basis and may actually function better with regular analgesic use

## 2013-09-05 NOTE — Progress Notes (Signed)
Reviewed and agree with CT scan of the abdomen and pelvis.

## 2013-09-05 NOTE — Patient Instructions (Addendum)
Please go to the basement level to have your labs drawn.  We sent refills on the Zofran (Ondansetron)  To [facility]. We sent refills also on Nexium.    You have been scheduled for a CT scan of the abdomen and pelvis at [facility] ([address]---this is in the same building as [facility]).   You are scheduled on Tuesday 09-06-2013 at 2:30 Pm . You should arrive aat 2:15 PM  prior to your appointment time for registration. Please follow the written instructions below on the day of your exam:  WARNING: IF YOU ARE ALLERGIC TO IODINE/X-RAY DYE, PLEASE NOTIFY RADIOLOGY IMMEDIATELY AT [phone]! YOU WILL BE GIVEN A 13 HOUR PREMEDICATION PREP.  1) Do not eat or drink anything after 10:30 am  (4 hours prior to your test) 2) You have been given 2 bottles of oral contrast to drink. The solution may taste  better if refrigerated, but do NOT add ice or any other liquid to this solution. Shake well before drinking.    Drink 1 bottle of contrast @ 12:30 PM  (2 hours prior to your exam)  Drink 1 bottle of contrast @ 1:30 PM  (1 hour prior to your exam)  You may take any medications as prescribed with a small amount of water except for the following: Metformin, Glucophage, Glucovance, Avandamet, Riomet, Fortamet, Actoplus Met, Janumet, Glumetza or Metaglip. The above medications must be held the day of the exam AND 48 hours after the exam.  The purpose of you drinking the oral contrast is to aid in the visualization of your intestinal tract. The contrast solution may cause some diarrhea. Before your exam is started, you will be given a small amount of fluid to drink. Depending on your individual set of symptoms, you may also receive an intravenous injection of x-ray contrast/dye. Plan on being at [facility] for 30 minutes or long, depending on the type of exam you are having performed.  If you have any questions regarding your exam or if you need to reschedule,  you may call the CT department at [phone] between the hours of 8:00 am and 5:00 pm, Monday-Friday.  ________________________________________________________________________

## 2013-09-06 ENCOUNTER — Ambulatory Visit (INDEPENDENT_AMBULATORY_CARE_PROVIDER_SITE_OTHER)
Admission: RE | Admit: 2013-09-06 | Discharge: 2013-09-06 | Disposition: A | Discharge status: Home / Self Care | Payer: Medicare Other | Source: Ambulatory Visit | Attending: Physician Assistant | Admitting: Physician Assistant

## 2013-09-06 DIAGNOSIS — R109 Unspecified abdominal pain: Secondary | ICD-10-CM

## 2013-09-06 DIAGNOSIS — R110 Nausea: Secondary | ICD-10-CM

## 2013-09-06 MED ORDER — IOHEXOL 300 MG/ML  SOLN
80.0000 mL | Freq: Once | INTRAMUSCULAR | Status: AC | PRN
  Administered 2013-09-06: 80 mL via INTRAVENOUS

## 2013-10-05 ENCOUNTER — Other Ambulatory Visit: Payer: Self-pay

## 2013-10-05 DIAGNOSIS — Z1231 Encounter for screening mammogram for malignant neoplasm of breast: Secondary | ICD-10-CM

## 2013-10-19 ENCOUNTER — Ambulatory Visit
Admission: RE | Admit: 2013-10-19 | Discharge: 2013-10-19 | Disposition: A | Discharge status: Home / Self Care | Payer: 59 | Source: Ambulatory Visit

## 2013-10-19 DIAGNOSIS — Z1231 Encounter for screening mammogram for malignant neoplasm of breast: Secondary | ICD-10-CM

## 2014-07-07 ENCOUNTER — Encounter: Payer: Self-pay | Admitting: Internal Medicine

## 2014-07-13 DIAGNOSIS — M7742 Metatarsalgia, left foot: Secondary | ICD-10-CM | POA: Insufficient documentation

## 2014-09-18 ENCOUNTER — Other Ambulatory Visit: Payer: Self-pay

## 2014-09-18 DIAGNOSIS — Z1231 Encounter for screening mammogram for malignant neoplasm of breast: Secondary | ICD-10-CM

## 2014-11-01 ENCOUNTER — Ambulatory Visit
Admission: RE | Admit: 2014-11-01 | Discharge: 2014-11-01 | Disposition: A | Discharge status: Home / Self Care | Payer: Medicare Other | Source: Ambulatory Visit

## 2014-11-01 DIAGNOSIS — Z1231 Encounter for screening mammogram for malignant neoplasm of breast: Secondary | ICD-10-CM

## 2015-09-24 ENCOUNTER — Other Ambulatory Visit: Payer: Self-pay | Admitting: Internal Medicine

## 2015-09-24 DIAGNOSIS — Z1231 Encounter for screening mammogram for malignant neoplasm of breast: Secondary | ICD-10-CM

## 2015-11-05 ENCOUNTER — Ambulatory Visit
Admission: RE | Admit: 2015-11-05 | Discharge: 2015-11-05 | Disposition: A | Discharge status: Home / Self Care | Payer: Medicare Other | Source: Ambulatory Visit | Attending: Internal Medicine | Admitting: Internal Medicine

## 2015-11-05 DIAGNOSIS — Z1231 Encounter for screening mammogram for malignant neoplasm of breast: Secondary | ICD-10-CM

## 2016-10-14 ENCOUNTER — Other Ambulatory Visit: Payer: Self-pay | Admitting: Internal Medicine

## 2016-10-14 DIAGNOSIS — Z1231 Encounter for screening mammogram for malignant neoplasm of breast: Secondary | ICD-10-CM

## 2016-11-05 ENCOUNTER — Ambulatory Visit
Admission: RE | Admit: 2016-11-05 | Discharge: 2016-11-05 | Disposition: A | Discharge status: Home / Self Care | Payer: Medicare Other | Source: Ambulatory Visit | Attending: Internal Medicine | Admitting: Internal Medicine

## 2016-11-05 DIAGNOSIS — Z1231 Encounter for screening mammogram for malignant neoplasm of breast: Secondary | ICD-10-CM

## 2017-06-01 ENCOUNTER — Other Ambulatory Visit: Payer: Self-pay | Admitting: Rheumatology

## 2017-06-01 DIAGNOSIS — R9389 Abnormal findings on diagnostic imaging of other specified body structures: Secondary | ICD-10-CM

## 2017-06-11 ENCOUNTER — Ambulatory Visit
Admission: RE | Admit: 2017-06-11 | Discharge: 2017-06-11 | Disposition: A | Discharge status: Home / Self Care | Payer: Medicare Other | Source: Ambulatory Visit | Attending: Rheumatology | Admitting: Rheumatology

## 2017-06-11 DIAGNOSIS — R9389 Abnormal findings on diagnostic imaging of other specified body structures: Secondary | ICD-10-CM

## 2017-06-11 MED ORDER — IOPAMIDOL (ISOVUE-300) INJECTION 61%
75.0000 mL | Freq: Once | INTRAVENOUS | Status: AC | PRN
  Administered 2017-06-11: 75 mL via INTRAVENOUS

## 2017-06-19 ENCOUNTER — Ambulatory Visit: Payer: Medicare Other | Admitting: Internal Medicine

## 2017-06-19 ENCOUNTER — Encounter: Payer: Self-pay | Admitting: Internal Medicine

## 2017-06-19 VITALS — BP 118/70 | HR 88 | Ht 63.0 in | Wt 143.6 lb

## 2017-06-19 DIAGNOSIS — R918 Other nonspecific abnormal finding of lung field: Secondary | ICD-10-CM | POA: Insufficient documentation

## 2017-06-19 DIAGNOSIS — R911 Solitary pulmonary nodule: Secondary | ICD-10-CM | POA: Insufficient documentation

## 2017-06-19 NOTE — Progress Notes (Signed)
Subjective:     Patient ID: Pamela Dunn, female   DOB: [birth year],    MRN: [id number]  HPI  17 yowf with RA / gout with arthritis symptoms since the age of 7 under the care of Dr Phylliss Bob initially now under [facility] and  not doing as well x 12/2016 >>  cxr >> CT >>  referred to pulmonary clinic 06/19/2017 by Dr  Deanne Coffer who is considering starting MTX   06/19/2017 1st [facility] office visit/ [facility]   Chief Complaint  Patient presents with  . Consult    productive cough for 3 months with green phlegm.   really bad cough starting in Dec 2019 rx just otc's and finally better with tessalon No longer needing any cough med / just zyrtec for running nose x sev weeks but "I don't have any sinus problems because I use Vicks save for that"  cxr mid march 2018 was abn but prev baseline was sev years ago and not avail at time of ov    No obvious day to day or daytime variability or assoc excess/ purulent sputum or mucus plugs or hemoptysis or cp or chest tightness, subjective wheeze or overt   hb symptoms. No unusual exposure hx or h/o childhood pna/ asthma or knowledge of premature birth.  Sleeping  Ok now  without nocturnal  or early am exacerbation  of respiratory  c/o's or need for noct saba. Also denies any obvious fluctuation of symptoms with weather or environmental changes or other aggravating or alleviating factors except as outlined above   Current Allergies, Complete Past Medical History, Past Surgical History, Family History, and Social History were reviewed in [employer] record.  ROS  The following are not active complaints unless bolded Hoarseness, sore throat, dysphagia, dental problems, itching, sneezing,  nasal congestion or discharge of excess mucus better with zyrtec /vicks or purulent secretions, ear ache,   fever, chills, sweats, unintended wt loss or wt gain, classically pleuritic or exertional cp,  orthopnea pnd or arm/hand swelling  or leg swelling,  presyncope, palpitations, abdominal pain, anorexia, nausea, vomiting, diarrhea  or change in bowel habits or change in bladder habits, change in stools or change in urine, dysuria, hematuria,  rash, arthralgias, visual complaints, headache, numbness, weakness or ataxia or problems with walking or coordination,  change in mood or  memory.        Current Meds  Medication Sig  . Aspirin-Caffeine (BAYER BACK & BODY PAIN EX ST) 500-32.5 MG TABS Take by mouth.  . cetirizine (ZYRTEC) 10 MG tablet Take 10 mg by mouth daily.  . cholecalciferol (VITAMIN D) 1000 UNITS tablet Take 2,000 Units by mouth daily.  . Coenzyme Q10 (COQ10) 200 MG CAPS Take 200 mg by mouth daily.  [name] Kitchen FOLIC ACID PO Take 1 tablet by mouth daily.  Boris Lown Oil 300 MG CAPS Take by mouth.  [name] Kitchen L-Methylfolate-B6-B12 (METANX PO) Take by mouth.  . levothyroxine (SYNTHROID) 25 MCG tablet Take 25 mcg by mouth every other day.  . predniSONE (DELTASONE) 2.5 MG tablet Take 2.5 mg by mouth daily.  . Tetrahydrozoline HCl (VISINE OP) Place 1 drop into both eyes daily.          Review of Systems     Objective:   Physical Exam    Frail elderly wf needs 1  Person  assist to stand and get on exam table "feet hurt"   Wt Readings from Last 3 Encounters:  06/19/17 143 lb 9.6 oz (  65.1 kg)  09/05/13 156 lb (70.8 kg)  07/06/13 156 lb (70.8 kg)     Vital signs reviewed - Note on arrival 02 sats  98% on RA      HEENT: nl dentition, turbinates bilaterally, and oropharynx. Nl external ear canals without cough reflex   NECK :  without JVD/Nodes/TM/ nl carotid upstrokes bilaterally   LUNGS: no acc muscle use,  Nl contour chest with inssp crackles R > L base but not    CV:  RRR  no s3 or murmur or increase in P2, and no edema   ABD:  soft and nontender with nl inspiratory excursion in the supine position. No bruits or organomegaly appreciated, bowel sounds nl  MS:  Slt unsteady gait with cane/ ext warm without deformities, calf  tenderness, cyanosis or clubbing Classic RA changes in hands/ no obvious skin nodules    SKIN: warm and dry without lesions    NEURO:  alert, approp, nl sensorium with  no motor or cerebellar deficits apparent.     I personally reviewed images and agree with radiology impression as follows:   Chest CT 06/11/17  1. Patchy nodular perilobular consolidation, ground-glass opacity and tree-in-bud opacities throughout both lungs, most prominent in the right lower lobe. A resolving multilobar bronchopneumonia is favored. Cryptogenic organizing pneumonia and neoplasm are considered less likely. Recommend follow-up chest CT in 3 months given the nodular character of the lung opacities. 2. Small hiatal hernia.     Assessment:

## 2017-06-19 NOTE — Assessment & Plan Note (Signed)
Chest CT 06/11/17  1. Patchy nodular perilobular consolidation, ground-glass opacity and tree-in-bud opacities throughout both lungs, most prominent in the right lower lobe. A resolving multilobar bronchopneumonia is favored. Cryptogenic organizing pneumonia and neoplasm are considered less likely. Recommend follow-up chest CT in 3 months given the nodular character of the lung opacities. 2. Small hiatal hernia.   Most likely what we're seeing on ct is either residual from recent prolonged cough with purulent sputum production that has clinically improved at this point and/or the residual of years of systemic RA with pulmonary involvement  with no evidence at all of ongoing infection and since the issue is potential for MTX toxicity the only issue for right now is achieving a firm baseline in term of clinical severity of dz (hard to do when she is so limited from exertion RA joint involvment  to not be able to use that indicator on her lung dz) so rec baseline cxr/ pfts in 2 weeks and at that time compare to previous cxr's and consider bal to r/o opportunistic infection eg MAI at that point.   Discussed in detail all the  indications, usual  risks and alternatives  relative to the benefits with patient who agrees to proceed with w/u as outlined.    Total time devoted to counseling  > 50 % of initial 60 min office visit:  review case with pt/son and daughter in law and discussion of options/alternatives/ personally creating written customized instructions  in presence of pt  then going over those specific  Instructions directly with the pt including how to use all of the meds but in particular covering each new medication in detail and the difference between the maintenance= "automatic" meds and the prns using an action plan format for the latter (If this problem/symptom => do that organization reading Left to right).  Please see AVS from this visit for a full list of these instructions which I  personally wrote for this pt and  are unique to this visit.

## 2017-06-19 NOTE — Patient Instructions (Signed)
Please schedule a follow up office visit in 2 weeks, sooner if needed with your disc that has your prior cxr from several years ago and the one mid march 2019 and we will do a cxr and pfts and decide next step

## 2017-07-15 ENCOUNTER — Encounter: Payer: Self-pay | Admitting: Internal Medicine

## 2017-07-15 ENCOUNTER — Ambulatory Visit (INDEPENDENT_AMBULATORY_CARE_PROVIDER_SITE_OTHER): Payer: Medicare Other | Admitting: Internal Medicine

## 2017-07-15 ENCOUNTER — Ambulatory Visit (INDEPENDENT_AMBULATORY_CARE_PROVIDER_SITE_OTHER)
Admission: RE | Admit: 2017-07-15 | Discharge: 2017-07-15 | Disposition: A | Discharge status: Home / Self Care | Payer: Medicare Other | Source: Ambulatory Visit | Attending: Internal Medicine | Admitting: Internal Medicine

## 2017-07-15 ENCOUNTER — Ambulatory Visit: Payer: Medicare Other | Admitting: Internal Medicine

## 2017-07-15 VITALS — BP 116/74 | HR 78 | Ht 63.0 in | Wt 146.0 lb

## 2017-07-15 DIAGNOSIS — R918 Other nonspecific abnormal finding of lung field: Secondary | ICD-10-CM

## 2017-07-15 LAB — PULMONARY FUNCTION TEST
DL/VA % pred: 83 %
DL/VA: 3.9 ml/min/mmHg/L
DLCO UNC % PRED: 56 %
DLCO unc: 13 ml/min/mmHg
FEF 25-75 PRE: 1.66 L/s
FEF 25-75 Post: 1.93 L/sec
FEF2575-%CHANGE-POST: 16 %
FEF2575-%PRED-POST: 166 %
FEF2575-%Pred-Pre: 143 %
FEV1-%Change-Post: 3 %
FEV1-%Pred-Post: 88 %
FEV1-%Pred-Pre: 85 %
FEV1-PRE: 1.47 L
FEV1-Post: 1.51 L
FEV1FVC-%CHANGE-POST: 3 %
FEV1FVC-%Pred-Pre: 114 %
FEV6-%CHANGE-POST: 0 %
FEV6-%PRED-POST: 79 %
FEV6-%Pred-Pre: 79 %
FEV6-PRE: 1.75 L
FEV6-Post: 1.75 L
FEV6FVC-%PRED-PRE: 106 %
FEV6FVC-%Pred-Post: 106 %
FVC-%CHANGE-POST: 0 %
FVC-%PRED-POST: 75 %
FVC-%Pred-Pre: 75 %
FVC-Post: 1.75 L
FVC-Pre: 1.75 L
Post FEV1/FVC ratio: 87 %
Post FEV6/FVC ratio: 100 %
Pre FEV1/FVC ratio: 84 %
Pre FEV6/FVC Ratio: 100 %
RV % pred: 235 %
RV: 5.68 L
TLC % pred: 178 %
TLC: 8.74 L

## 2017-07-15 NOTE — Progress Notes (Signed)
PFT completed 07/15/17

## 2017-07-15 NOTE — Progress Notes (Signed)
Subjective:     Patient ID: Pamela Dunn, female   DOB: [birth year],    MRN: [id number]    Brief patient profile:  81 yowf with RA / gout with arthritis symptoms since the age of 50 under the care of Dr Phylliss Bob initially now under [facility] and  not doing as well x 12/2016 >>  cxr >> CT >>  referred to pulmonary clinic 06/19/2017 by Dr  Deanne Coffer who is considering starting MTX   History of Present Illness  06/19/2017 1st [facility] office visit/ Kamesha Herne   Chief Complaint  Patient presents with  . Consult    productive cough for 3 months with green phlegm.   really bad cough starting in Dec 2019 rx just otc's and finally better with tessalon No longer needing any cough med / just zyrtec for running nose x sev weeks but "I don't have any sinus problems because I use Vicks save for that"  cxr mid march 2018 was abn but prev baseline was sev years ago and not avail at time of ov  rec Please schedule a follow up office visit in 2 weeks, sooner if needed with your disc that has your prior cxr from several years ago and the one mid march 2019 and we will do a cxr and pfts and decide next step     07/15/2017  f/u ov/Elison Worrel re: PF ?related to RA Chief Complaint  Patient presents with  . Follow-up    CXR and PFT's done today. Breathing is doing well and no new co's.    Dyspnea:  Worked in yard pruning/ bending over from a chair / housework / no regular shopping  Cough: gone Sleep: fine  SABA use:  Prednisone 5 mg one half daily     No obvious day to day or daytime variability or assoc excess/ purulent sputum or mucus plugs or hemoptysis or cp or chest tightness, subjective wheeze or overt sinus or hb symptoms. No unusual exposure hx or h/o childhood pna/ asthma or knowledge of premature birth.  Sleeping fine flat without nocturnal  or early am exacerbation  of respiratory  c/o's or need for noct saba. Also denies any obvious fluctuation of symptoms with weather or environmental changes or other  aggravating or alleviating factors except as outlined above   Current Allergies, Complete Past Medical History, Past Surgical History, Family History, and Social History were reviewed in [employer] record.  ROS  The following are not active complaints unless bolded Hoarseness, sore throat, dysphagia, dental problems, itching, sneezing,  nasal congestion or discharge of excess mucus or purulent secretions, ear ache,   fever, chills, sweats, unintended wt loss or wt gain, classically pleuritic or exertional cp,  orthopnea pnd or arm/hand swelling  or leg swelling, presyncope, palpitations, abdominal pain, anorexia, nausea, vomiting, diarrhea  or change in bowel habits or change in bladder habits, change in stools or change in urine, dysuria, hematuria,  rash, arthralgias, visual complaints, headache, numbness, weakness or ataxia or problems with walking or coordination,  change in mood or  memory.        Current Meds  Medication Sig  . Aspirin-Caffeine (BAYER BACK & BODY PAIN EX ST) 500-32.5 MG TABS Take by mouth.  . cetirizine (ZYRTEC) 10 MG tablet Take 10 mg by mouth daily.  . cholecalciferol (VITAMIN D) 1000 UNITS tablet Take 2,000 Units by mouth daily.  . Coenzyme Q10 (COQ10) 200 MG CAPS Take 200 mg by mouth daily.  [place] Lanius  300 MG CAPS Take by mouth.  [name] Kitchen L-Methylfolate-B6-B12 (METANX PO) Take by mouth.  . levothyroxine (SYNTHROID) 25 MCG tablet Take 25 mcg by mouth every other day.  . ondansetron (ZOFRAN) 4 MG tablet Take 1 tab every morning, take 2nd dose later in the day as needed.  . Pitavastatin Calcium (LIVALO) 2 MG TABS Take by mouth 2 (two) times a week.  . predniSONE (DELTASONE) 2.5 MG tablet Take 2.5 mg by mouth daily.                     Objective:   Physical Exam    Frail elderly wf could not get up on exam table with one person assist "knees hurt, give out"    07/16/2017          146  06/19/17 143 lb 9.6 oz (65.1 kg)  09/05/13 156 lb  (70.8 kg)  07/06/13 156 lb (70.8 kg)     Vital signs reviewed - Note on arrival 02 sats  96% on RA        HEENT: nl dentition, turbinates bilaterally, and oropharynx. Nl external ear canals without cough reflex   NECK :  without JVD/Nodes/TM/ nl carotid upstrokes bilaterally   LUNGS: no acc muscle use,  Nl contour chest with minimal insp/exp rhonchi  bilaterally without cough on insp or exp maneuvers   CV:  RRR  no s3 or murmur or increase in P2, and no edema   ABD:  soft and nontender with nl inspiratory excursion in the supine position. No bruits or organomegaly appreciated, bowel sounds nl  MS: unsteady gait, walks with cane/ ext warm without deformities, calf tenderness, cyanosis or clubbing Classic RA changes in hands   SKIN: warm and dry without lesions    NEURO:  alert, approp, nl sensorium with  no motor or cerebellar deficits apparent.          I personally reviewed images and agree with radiology impression as follows:   Chest CT 06/11/17  1. Patchy nodular perilobular consolidation, ground-glass opacity and tree-in-bud opacities throughout both lungs, most prominent in the right lower lobe. A resolving multilobar bronchopneumonia is favored. Cryptogenic organizing pneumonia and neoplasm are considered less likely. Recommend follow-up chest CT in 3 months given the nodular character of the lung opacities. 2. Small hiatal hernia.     Assessment:

## 2017-07-15 NOTE — Patient Instructions (Signed)
Most likely you have mild scarring on your lungs from your rheumatoid arthritis so treating the arthritis with methotrexate may help both   I strongly recommend regular walking and monitoring your 02 saturations with walking to give early warning of your condition worsening over time  (goal is keep over well over 90% so if drifts down toward 90% please let me know)  Pulmonary follow up is as needed

## 2017-07-16 ENCOUNTER — Encounter: Payer: Self-pay | Admitting: Internal Medicine

## 2017-07-16 ENCOUNTER — Telehealth: Payer: Self-pay | Admitting: Internal Medicine

## 2017-07-16 NOTE — Telephone Encounter (Signed)
Advised pt of results. Pt understood and nothing further is needed.    Notes recorded by Nyoka Cowden, MD on 07/16/2017 at 1:05 PM EDT Call pt: Reviewed cxr and no acute change so no change in recommendations made at [facility]

## 2017-07-16 NOTE — Progress Notes (Signed)
LMTCB

## 2017-07-16 NOTE — Assessment & Plan Note (Signed)
Chest CT 06/11/17  1. Patchy nodular perilobular consolidation, ground-glass opacity and tree-in-bud opacities throughout both lungs, most prominent in the right lower lobe. A resolving multilobar bronchopneumonia is favored. Cryptogenic organizing pneumonia and neoplasm are considered less likely. Recommend follow-up chest CT in 3 months given the nodular character of the lung opacities. 2. Small hiatal hernia. PFT's  07/15/2017  FVC  1.75 (75%) s obst and with DLCO  56 % corrects to 83 % for alv volume  Prednisone 2.5 mg x "weeks to months" and no mtx    cxr from 05/27/17 reviewed and looks the same as today's but nothing available prior.  Suspect the ct changes were resolving pna or related to RA but her symptoms (cough mostly)  Have resolved with good  r underlying lung function so reasonable to start mtx   Advised best to stay active (eg pushing cart at grocery store) and monitor ex sats to reflect any ILD that may result from RA or MTX and also tried to explain concordant vs discordant lung/joint symptoms to point to or away from MTX toxicity (if the joints are getting better and the breathing is getting worse, that's more suggestive of mtx toxicity than RA lung dz)   Pulmonary f/u can be prn   I had an extended discussion with the patient reviewing all relevant studies completed to date and  lasting 15 to 20 minutes of a 25 minute visit    Each maintenance medication was reviewed in detail including most importantly the difference between maintenance and prns and under what circumstances the prns are to be triggered using an action plan format that is not reflected in the computer generated alphabetically organized AVS.    Please see AVS for specific instructions unique to this visit that I personally wrote and verbalized to the the pt in detail and then reviewed with pt  by my nurse highlighting any  changes in therapy recommended at today's visit to their plan of care.

## 2017-10-27 ENCOUNTER — Other Ambulatory Visit: Payer: Self-pay | Admitting: Internal Medicine

## 2017-10-27 DIAGNOSIS — Z1231 Encounter for screening mammogram for malignant neoplasm of breast: Secondary | ICD-10-CM

## 2017-11-18 ENCOUNTER — Ambulatory Visit
Admission: RE | Admit: 2017-11-18 | Discharge: 2017-11-18 | Disposition: A | Discharge status: Home / Self Care | Payer: Medicare Other | Source: Ambulatory Visit | Attending: Internal Medicine | Admitting: Internal Medicine

## 2017-11-18 DIAGNOSIS — Z1231 Encounter for screening mammogram for malignant neoplasm of breast: Secondary | ICD-10-CM

## 2018-05-20 NOTE — Progress Notes (Signed)
Patient referred by Pamela Dunn for abnormal EKG  Subjective:   @Patient  ID: Pamela Dunn, female    DOB: [birth year], 85 y.o.   MRN: [id number]  Chief Complaint  Patient presents with  . Abnormal ECG    lbbb    HPI PCP Note from 05/03/2018: LBBB new since EKG in 09/15/2012. Patient not complaining of any symptoms. Patient asked for referral for Dr. Einar Dunn.   [name] is a 85 year old Caucasian female with long-standing history of recurrent arthritis, who was referred to me for evaluation of new onset left bundle branch block.  Except for arthritis and her physical disability due to arthritis and using a cane to walk, also has pain in her hand joints she is no specific complaints, denies shortness of breath or chest pain or palpitations.  She is unable to do much physical activity due to her arthritis.  She is [address] LBBB on routine physical exam compared to 2014.  Hence referred to me.  Past Medical History:  Diagnosis Date  . Allergy    seasonal  . Anemia   . Arthritis   . GERD (gastroesophageal reflux disease)   . Hyperlipidemia     Past Surgical History:  Procedure Laterality Date  . APPENDECTOMY    . BACK SURGERY      Social History   Socioeconomic History  . Marital status: Married    Spouse name: Not on file  . Number of children: 2  . Years of education: Not on file  . Highest education level: Not on file  Occupational History  . Not on file  Social Needs  . Financial resource strain: Not on file  . Food insecurity:    Worry: Not on file    Inability: Not on file  . Transportation needs:    Medical: Not on file    Non-medical: Not on file  Tobacco Use  . Smoking status: Never Smoker  . Smokeless tobacco: Never Used  Substance and Sexual Activity  . Alcohol use: No  . Drug use: No  . Sexual activity: Not on file  Lifestyle  . Physical activity:    Days per week: Not on file    Minutes per session: Not on file  . Stress: Not on file   Relationships  . Social connections:    Talks on phone: Not on file    Gets together: Not on file    Attends religious service: Not on file    Active member of club or organization: Not on file    Attends meetings of clubs or organizations: Not on file    Relationship status: Not on file  . Intimate partner violence:    Fear of current or ex partner: Not on file    Emotionally abused: Not on file    Physically abused: Not on file    Forced sexual activity: Not on file  Other Topics Concern  . Not on file  Social History Narrative  . Not on file    Current Outpatient Medications on File Prior to Visit  Medication Sig Dispense Refill  . allopurinol (ZYLOPRIM) 100 MG tablet Take 100 mg by mouth daily.    . Aspirin-Caffeine (BAYER BACK & BODY PAIN EX ST) 500-32.5 MG TABS Take by mouth.    . [place]-Glucosamine-Vit D (OSTEO BI-FLEX ONE PER DAY PO) Take 1 tablet by mouth daily.    . cholecalciferol (VITAMIN D) 1000 UNITS tablet Take 2,000 Units by mouth daily.    [name] Kitchen  Coenzyme Q10 (COQ10) 200 MG CAPS Take 200 mg by mouth daily.    . folic acid (FOLVITE) 1 MG tablet Take 1 mg by mouth 2 (two) times daily.    Javier Docker Oil 300 MG CAPS Take by mouth.    . levothyroxine (SYNTHROID) 25 MCG tablet Take 25 mcg by mouth every other day.    . methotrexate (RHEUMATREX) 2.5 MG tablet TAKE 6 TABLETS BY MOUTH ONCE WEEKLY    . Multiple Vitamins-Minerals (PRESERVISION AREDS 2 PO) Take 1 tablet by mouth daily.    . predniSONE (DELTASONE) 2.5 MG tablet Take 5 mg by mouth daily.     . cetirizine (ZYRTEC) 10 MG tablet Take 10 mg by mouth daily.    [name] Kitchen L-Methylfolate-B6-B12 (METANX PO) Take by mouth.    . ondansetron (ZOFRAN) 4 MG tablet Take 1 tab every morning, take 2nd dose later in the day as needed. (Patient not taking: Reported on 05/21/2018) 45 tablet 6  . Pitavastatin Calcium (LIVALO) 2 MG TABS Take by mouth 2 (two) times a week.     No current facility-administered medications on file prior to visit.     CXR PA/LAT 07/15/17: Cardiac shadow is within normal limits. Mild aortic calcifications are seen. A partially calcified nodule is faintly visualized in the left upper lobe adjacent to the fissure. Some minimal scarring is noted in the right lung base and left mid lung. No focal confluent infiltrate or effusion is seen. No bony abnormality is noted. The changes seen in the right lower lobe previously are less well visualized. No bony abnormality is noted.  IMPRESSION: Some improvement when compare with the prior CT examination. Follow-up chest CT is again recommended in July of 2019 to assess for resolution/stability.   CT Chest with contrast 06/11/2017: 1. Patchy nodular perilobular consolidation, ground-glass opacity and tree-in-bud opacities throughout both lungs, most prominent in the right lower lobe. A resolving multilobar bronchopneumonia is favored. Cryptogenic organizing pneumonia and neoplasm are considered less likely. Recommend follow-up chest CT in 3 months given the nodular character of the lung opacities. 2. Small hiatal hernia. 3. Two-vessel coronary atherosclerosis.  [facility] 07/14/2012: Left ventricle: The cavity size was normal. Wall thicknesswas increased in a pattern of mild LVH. Systolic functionwas normal. The estimated ejection fraction was in therange of 55% to 60%. Wall motion was normal; there were noregional wall motion abnormalities. Doppler parameters areconsistent with abnormal left ventricular relaxation(grade 1 diastolic dysfunction). Doppler parameters areconsistent with elevated mean left atrial fillingpressure. - Aortic valve: Trileaflet; mildly thickened leaflets. - Mitral valve: Mildly thickened leaflets . Systolic bowingwithout prolapse. Mild regurgitation. - Right ventricle: Systolic pressure was increased. - Atrial septum: No defect or patent foramen ovale wasidentified. - Tricuspid valve: Mild regurgitation. - Pulmonary arteries:  PA peak pressure: 43m Hg (S).  [facility]: No evidence of thrombus or thrombophlebitis.    Recent Labs:   PCP 04/26/2018:  Glucose 81, BUN/Cr 21/1.09, eGFR 47, Na/K 144/4.8, AST 21, ALT 8. Alk Phos 50. Rest of CMP normal.  WBC 13.3, RBC 3.71, H/H 11.3/35.3, MCV 96, Platelets 347,  Rest of CBC normal.    Review of Systems  Constitutional: Negative for unexpected weight change.  HENT: Negative for congestion.   Eyes: Negative for visual disturbance.  Gastrointestinal: Negative for abdominal pain, nausea and vomiting.  Endocrine: Negative for cold intolerance.  Genitourinary: Negative for dysuria.  Musculoskeletal: Positive for arthralgias and joint swelling (rheumatoid arthritis). Negative for myalgias.  Skin: Negative for  rash.  Allergic/Immunologic: Negative for immunocompromised state.  Neurological: Negative for dizziness.  Hematological: Does not bruise/bleed easily.  Psychiatric/Behavioral: The patient is not nervous/anxious.   All other systems reviewed and are negative.      Objective:   Physical Exam Vitals signs and nursing note reviewed.  Constitutional:      General: She is not in acute distress.    Appearance: She is obese. She is not toxic-appearing.     Comments: Short stature  HENT:     Head: Normocephalic and atraumatic.     Nose: No congestion.     Mouth/Throat:     Mouth: Mucous membranes are dry.  Eyes:     Pupils: Pupils are equal, round, and reactive to light.  Neck:     Musculoskeletal: Neck supple. No muscular tenderness.  Abdominal:     General: Abdomen is flat. Bowel sounds are normal. There is no distension.     Palpations: Abdomen is soft.  Musculoskeletal:        General: No tenderness.     Comments: Rheumatoid arthritis deformity of the fingers  Lymphadenopathy:     Cervical: No cervical adenopathy.  Skin:    General: Skin is warm and dry.  Neurological:     General: No focal deficit  present.     Mental Status: She is alert and oriented to person, place, and time.  Psychiatric:        Mood and Affect: Mood normal.      Assessment & Recommendations:   LBBB (left bundle branch block) - Plan: EKG 12-Lead, PCV ECHOCARDIOGRAM COMPLETE  Rheumatoid arthritis involving multiple sites with positive rheumatoid factor ([place]) - Plan: PCV ECHOCARDIOGRAM COMPLETE  Abnormal EKG   EKG 05/21/2018: Normal sinus rhythm at rate of 79 bpm, right atrial enlargement, left bundle branch block.  No further analysis.  Abnormal EKG. No significant change from  EKG 05/03/2018.  Patient attempted arthritis referred to me for evaluation of new onset left bundle branch block.  Essentially sedentary due to arthritis, denies chest pain or shortness of breath.  No physical abnormalities from cardiac standpoint although she does have 0.0 don't arthritis in the hands.  Will obtain an echocardiogram to evaluate LV function, unless she has significant abnormalities, she'll also need a cardiac stress test.  Otherwise in view of being asymptomatic, continued primary prevention is indicated.  She is now being a smoker all her life, fairly been active until arthritis as the: Life.  Thank you for referring the patient to [place]. Please feel free to contact with any questions.  Adrian Prows, MD, [facility] 05/21/2018, 10:27 PM [facility]. [place] Pager: [phone] Office: [phone] If no answer Cell [phone]

## 2018-05-21 ENCOUNTER — Ambulatory Visit: Payer: Medicare Other | Admitting: Cardiology

## 2018-05-21 ENCOUNTER — Other Ambulatory Visit: Payer: Self-pay

## 2018-05-21 ENCOUNTER — Encounter: Payer: Self-pay | Admitting: Cardiology

## 2018-05-21 VITALS — BP 147/62 | HR 74 | Ht 64.0 in | Wt 149.1 lb

## 2018-05-21 DIAGNOSIS — I447 Left bundle-branch block, unspecified: Secondary | ICD-10-CM

## 2018-05-21 DIAGNOSIS — R9431 Abnormal electrocardiogram [ECG] [EKG]: Secondary | ICD-10-CM

## 2018-05-21 DIAGNOSIS — M0579 Rheumatoid arthritis with rheumatoid factor of multiple sites without organ or systems involvement: Secondary | ICD-10-CM | POA: Diagnosis not present

## 2018-06-07 ENCOUNTER — Other Ambulatory Visit: Payer: Medicare Other

## 2018-06-21 ENCOUNTER — Ambulatory Visit: Payer: Medicare Other | Admitting: Cardiology

## 2018-07-06 ENCOUNTER — Other Ambulatory Visit: Payer: Medicare Other

## 2018-07-14 ENCOUNTER — Ambulatory Visit: Payer: Medicare Other | Admitting: Cardiology

## 2018-08-04 ENCOUNTER — Ambulatory Visit: Payer: Medicare Other | Admitting: Podiatry

## 2018-08-04 ENCOUNTER — Ambulatory Visit (INDEPENDENT_AMBULATORY_CARE_PROVIDER_SITE_OTHER): Payer: Medicare Other

## 2018-08-04 ENCOUNTER — Other Ambulatory Visit: Payer: Self-pay

## 2018-08-04 ENCOUNTER — Encounter: Payer: Self-pay | Admitting: Podiatry

## 2018-08-04 DIAGNOSIS — L97522 Non-pressure chronic ulcer of other part of left foot with fat layer exposed: Secondary | ICD-10-CM | POA: Diagnosis not present

## 2018-08-04 DIAGNOSIS — L97512 Non-pressure chronic ulcer of other part of right foot with fat layer exposed: Secondary | ICD-10-CM

## 2018-08-04 DIAGNOSIS — M21611 Bunion of right foot: Secondary | ICD-10-CM

## 2018-08-04 DIAGNOSIS — M21612 Bunion of left foot: Secondary | ICD-10-CM | POA: Diagnosis not present

## 2018-08-04 DIAGNOSIS — M05772 Rheumatoid arthritis with rheumatoid factor of left ankle and foot without organ or systems involvement: Secondary | ICD-10-CM

## 2018-08-04 DIAGNOSIS — M05771 Rheumatoid arthritis with rheumatoid factor of right ankle and foot without organ or systems involvement: Secondary | ICD-10-CM

## 2018-08-04 MED ORDER — GENTAMICIN SULFATE 0.1 % EX CREA: 1.0000 "application " | TOPICAL_CREAM | Freq: Two times a day (BID) | CUTANEOUS | 1 refills | Status: DC

## 2018-08-04 NOTE — Progress Notes (Signed)
   HPI: 85 year old female presents the office today for evaluation of bilateral foot ulcerations.  Patient states approximately 4 weeks ago she was walking around her house barefoot and developed ulcers to the plantar aspect of the bilateral feet.  She went to her primary care physician who referred her to podiatry.  This is a referral from Dr. Elijah Birk, podiatrist locally.  Patient has a diagnosed history of rheumatoid arthritis, however she denies foot pain with exception of the ulcerations.  Past Medical History:  Diagnosis Date  . Allergy    seasonal  . Anemia   . Arthritis   . GERD (gastroesophageal reflux disease)   . Hyperlipidemia      Physical Exam: General: The patient is alert and oriented x3 in no acute distress.  Dermatology: Ulcer is noted to the bilateral sub-first MTPJ measuring approximately 1.0 x 0.5 x 0.2 cm.  Wound base is granular.  There is no exposed bone muscle tendon ligament or joint.  No malodor noted.  Periwound integrity is intact.  Skin is warm, dry and supple bilateral lower extremities.   Vascular: Palpable pedal pulses bilaterally. No edema or erythema noted. Capillary refill within normal limits.  Neurological: Epicritic and protective threshold grossly intact bilaterally.   Musculoskeletal Exam: Range of motion within normal limits to all pedal and ankle joints bilateral. Muscle strength 5/5 in all groups bilateral.  Rheumatoid foot type with rigid contracture of the forefoot and hammertoes 1-5 bilateral.  Radiographic Exam:  Normal osseous mineralization.  Joint space destruction noted with subluxation of the joints to the MTPJ's bilateral.  Hammertoe contracture deformity also noted bilateral.. No fracture identified.    Assessment: 1.  Rheumatoid foot type bilateral 2.  Ulcers sub-first MTPJ bilateral   Plan of Care:  1. Patient evaluated. X-Rays reviewed.  2.  Medically necessary excisional debridement including subcutaneous tissue was performed  using a tissue nipper.  Excisional debridement of all necrotic nonviable tissue down to healthy bleeding viable tissue was performed with post debridement measurement same as pre-.  Light dressing applied. 3.  Prescription for gentamicin cream 4.  Offloading dancer pads were dispensed today 5.  Return to clinic in 3 weeks  *[name] name is Sallyanne Havers, DPM [facility]  Dr. Felecia Shelling, DPM    [address] [place], [place] [address]                Office [phone]  Fax [phone]

## 2018-08-11 ENCOUNTER — Ambulatory Visit (INDEPENDENT_AMBULATORY_CARE_PROVIDER_SITE_OTHER): Payer: Medicare Other

## 2018-08-11 ENCOUNTER — Other Ambulatory Visit: Payer: Self-pay

## 2018-08-11 DIAGNOSIS — M0579 Rheumatoid arthritis with rheumatoid factor of multiple sites without organ or systems involvement: Secondary | ICD-10-CM

## 2018-08-11 DIAGNOSIS — I447 Left bundle-branch block, unspecified: Secondary | ICD-10-CM

## 2018-08-16 ENCOUNTER — Ambulatory Visit: Payer: Medicare Other | Admitting: Cardiology

## 2018-08-24 ENCOUNTER — Encounter: Payer: Self-pay | Admitting: Cardiology

## 2018-08-24 ENCOUNTER — Other Ambulatory Visit: Payer: Self-pay

## 2018-08-24 ENCOUNTER — Ambulatory Visit (INDEPENDENT_AMBULATORY_CARE_PROVIDER_SITE_OTHER): Payer: Medicare Other | Admitting: Cardiology

## 2018-08-24 VITALS — Ht 62.0 in | Wt 142.0 lb

## 2018-08-24 DIAGNOSIS — R0602 Shortness of breath: Secondary | ICD-10-CM

## 2018-08-24 DIAGNOSIS — R9431 Abnormal electrocardiogram [ECG] [EKG]: Secondary | ICD-10-CM

## 2018-08-24 DIAGNOSIS — I447 Left bundle-branch block, unspecified: Secondary | ICD-10-CM

## 2018-08-24 DIAGNOSIS — I429 Cardiomyopathy, unspecified: Secondary | ICD-10-CM | POA: Diagnosis not present

## 2018-08-24 MED ORDER — CARVEDILOL 6.25 MG PO TABS: 6.2500 mg | ORAL_TABLET | Freq: Two times a day (BID) | ORAL | 2 refills | Status: DC

## 2018-08-24 NOTE — Progress Notes (Signed)
Virtual Visit via Telephone Note: Patient unable to use video assisted device.  This visit type was conducted due to national recommendations for restrictions regarding the COVID-19 Pandemic (e.g. social distancing).  This format is felt to be most appropriate for this patient at this time.  All issues noted in this document were discussed and addressed.  No physical exam was performed.  The patient has consented to conduct a Telehealth visit and understands insurance will be billed.   I connected with@, on 08/24/18 at  by TELEPHONE and verified that I am speaking with the correct person using two identifiers.   I discussed the limitations of evaluation and management by telemedicine and the availability of in person appointments. The patient expressed understanding and agreed to proceed.   I have discussed with patient regarding the safety during COVID Pandemic and steps and precautions to be taken including social distancing, frequent hand wash and use of detergent soap, gels with the patient. I asked the patient to avoid touching mouth, nose, eyes, ears with the hands. I encouraged regular walking around the neighborhood and exercise and regular diet, as long as social distancing can be maintained.  Primary Physician/Referring:  Jani Gravel, MD  Patient ID: Pamela Dunn, female    DOB: [birth year], 85 y.o.   MRN: [id number]  Chief Complaint  Patient presents with  . LBBB  . Results  . Follow-up    4wk    HPI: Pamela Dunn  is a 85 y.o. female  with with long-standing history of recurrent arthritis, who was referred to me for evaluation of new onset left bundle branch block.  Except for arthritis and her physical disability due to arthritis and using a cane to walk, also has pain in her hand joints.  Due to findings of new LBBB on routine physical exam compared to 2014, she was seen 3 months ago and underwent echocardiogram and presents to discuss results s it showed LVEF 20%.    Although she had no complaints, on further questioning, she had noticed marked fatigue and also weakness and dyspnea.  States that she recently had labs which also revealed that she was anemic.  Recently she has started to feel slightly better.  Denies PND or orthopnea or leg edema.  Past Medical History:  Diagnosis Date  . Allergy    seasonal  . Anemia   . Arthritis   . GERD (gastroesophageal reflux disease)   . Hyperlipidemia     Past Surgical History:  Procedure Laterality Date  . APPENDECTOMY    . BACK SURGERY      Social History   Socioeconomic History  . Marital status: Widowed    Spouse name: Not on file  . Number of children: 2  . Years of education: Not on file  . Highest education level: Not on file  Occupational History  . Not on file  Social Needs  . Financial resource strain: Not on file  . Food insecurity    Worry: Not on file    Inability: Not on file  . Transportation needs    Medical: Not on file    Non-medical: Not on file  Tobacco Use  . Smoking status: Never Smoker  . Smokeless tobacco: Never Used  Substance and Sexual Activity  . Alcohol use: No  . Drug use: No  . Sexual activity: Not on file  Lifestyle  . Physical activity    Days per week: Not on file    Minutes per session:  Not on file  . Stress: Not on file  Relationships  . Social Herbalist on phone: Not on file    Gets together: Not on file    Attends religious service: Not on file    Active member of club or organization: Not on file    Attends meetings of clubs or organizations: Not on file    Relationship status: Not on file  . Intimate partner violence    Fear of current or ex partner: Not on file    Emotionally abused: Not on file    Physically abused: Not on file    Forced sexual activity: Not on file  Other Topics Concern  . Not on file  Social History Narrative  . Not on file   Review of Systems  Constitution: Positive for malaise/fatigue. Negative for  chills, decreased appetite and weight gain.  Cardiovascular: Positive for dyspnea on exertion. Negative for leg swelling and syncope.  Endocrine: Negative for cold intolerance.  Hematologic/Lymphatic: Does not bruise/bleed easily.  Musculoskeletal: Positive for arthritis, joint pain and joint swelling.  Gastrointestinal: Negative for abdominal pain, anorexia, change in bowel habit, hematochezia and melena.  Neurological: Negative for headaches and light-headedness.  Psychiatric/Behavioral: Negative for depression and substance abuse.  All other systems reviewed and are negative.     Objective  Height 5' 2"  (1.575 m), weight 142 lb (64.4 kg). Body mass index is 25.97 kg/m.   Physical exam not performed or limited due to virtual visit.   Please see exam details from prior visit is as below.  Physical Exam  Constitutional: She appears well-developed. No distress.  Mildly obese  HENT:  Head: Atraumatic.  Eyes: Conjunctivae are normal.  Neck: Neck supple. No JVD present. No thyromegaly present.  Cardiovascular: Normal rate, regular rhythm, normal heart sounds and intact distal pulses. Exam reveals no gallop.  No murmur heard. Pulmonary/Chest: Effort normal and breath sounds normal.  Abdominal: Soft. Bowel sounds are normal.  Musculoskeletal: Normal range of motion.  Neurological: She is alert.  Skin: Skin is warm and dry.  Psychiatric: She has a normal mood and affect.   Radiology: No results found.  Laboratory examination:  PCP 04/26/2018:  Glucose 81, BUN/Cr 21/1.09, eGFR 47, Na/K 144/4.8, AST 21, ALT 8. Alk Phos 50. Rest of CMP normal.  WBC 13.3, RBC 3.71, H/H 11.3/35.3, MCV 96, Platelets 347,  Rest of CBC normal.   Lipid Panel  No results found for: CHOL, TRIG, HDL, CHOLHDL, VLDL, LDLCALC, LDLDIRECT HEMOGLOBIN A1C No results found for: HGBA1C, MPG TSH No results for input(s): TSH in the last 8760 hours.   Medications   Medications Discontinued During This Encounter   Medication Reason  . cephALEXin (KEFLEX) 500 MG capsule Completed Course  . cetirizine (ZYRTEC) 10 MG tablet Patient Preference  . ciprofloxacin (CIPRO) 500 MG tablet Completed Course  . Krill Oil 300 MG CAPS Patient Preference  . L-Methylfolate-B6-B12 (METANX PO) Completed Course  . ondansetron (ZOFRAN) 4 MG tablet Completed Course  . Pitavastatin Calcium (LIVALO) 2 MG TABS Prescription never filled  . valACYclovir (VALTREX) 1000 MG tablet Completed Course   Current Meds  Medication Sig  . allopurinol (ZYLOPRIM) 100 MG tablet Take 100 mg by mouth daily.  . Aspirin-Caffeine (BAYER BACK & BODY PAIN EX ST) 500-32.5 MG TABS Take by mouth 2 (two) times a day.   . [place]-Glucosamine-Vit D (OSTEO BI-FLEX ONE PER DAY PO) Take 1 tablet by mouth daily.  . cholecalciferol (VITAMIN D) 1000 UNITS tablet  Take 1,000 Units by mouth daily.   . Coenzyme Q10 (COQ10) 200 MG CAPS Take 200 mg by mouth daily.  . folic acid (FOLVITE) 1 MG tablet Take 1 mg by mouth 2 (two) times daily.  [name] Kitchen gentamicin cream (GARAMYCIN) 0.1 % Apply 1 application topically 2 (two) times daily.  [name] Kitchen levothyroxine (SYNTHROID) 25 MCG tablet Take 25 mcg by mouth every other day.  . methotrexate (RHEUMATREX) 2.5 MG tablet TAKE 6 TABLETS BY MOUTH ONCE WEEKLY  . Multiple Vitamins-Minerals (PRESERVISION AREDS 2 PO) Take 1 tablet by mouth daily.  . predniSONE (DELTASONE) 2.5 MG tablet Take 5 mg by mouth daily.     Cardiac Studies:   Echocardiogram 08/11/2018 :  Severely depressed LV systolic function with EF 20%. Left ventricle cavity is normal in size. Moderate concentric hypertrophy of the left ventricle. Doppler evidence of grade I (impaired) diastolic dysfunction, elevated LAP. There is diffuse hypokinesis. There is anterior, septal, apical akinesis.  Calculated EF 20%. Trileaflet aortic valve. Trace aortic regurgitation. Structurally normal appearing mitral valve. Moderate to severe anteriorly directed mitral regurgitation. Consider  papillary muscle dysfunction. Mild tricuspid regurgitation. No evidence of pulmonary hypertension. Compared to 07/14/2012, EF was previously normal.  CXR PA/LAT 07/15/17: Cardiac shadow is within normal limits. Mild aortic calcifications are seen. A partially calcified nodule is faintly visualized in the left upper lobe adjacent to the fissure. Some minimal scarring is noted in the right lung base and left mid lung. No focal confluent infiltrate or effusion is seen. No bony abnormality is noted. The changes seen in the right lower lobe previously are less well visualized. No bony abnormality is noted.  IMPRESSION: Some improvement when compare with the prior CT examination. Follow-up chest CT is again recommended in July of 2019 to assess for resolution/stability.  CT Chest with contrast 06/11/2017: 1. Patchy nodular perilobular consolidation, ground-glass opacity and tree-in-bud opacities throughout both lungs, most prominent in the right lower lobe. A resolving multilobar bronchopneumonia is favored. Cryptogenic organizing pneumonia and neoplasm are considered less likely. Recommend follow-up chest CT in 3 months given the nodular character of the lung opacities. 2. Small hiatal hernia. 3. Two-vessel coronary atherosclerosis.  Lower Extremity Venous Duplex Bilateral at [facility] 07/14/2012: No evidence of thrombus or thrombophlebitis.  Assessment   LBBB (left bundle branch block) - Plan: PCV MYOCARDIAL PERFUSION WITH LEXISCAN,   Shortness of breath  Abnormal EKG   Cardiomyopathy, unspecified type (HCC) - Plan: carvedilol (COREG) 6.25 MG tablet, PCV MYOCARDIAL PERFUSION WITH LEXISCAN   EKG 05/21/2018: Normal sinus rhythm at rate of 79 bpm, right atrial enlargement, left bundle branch block.  No further analysis.  Abnormal EKG. No significant change from  EKG 05/03/2018.  Recommendations:   Patient was initially referred to me 3 months ago for evaluation of new onset left bundle branch  block compared to 2014.   I reviewed the results of the echocardiogram the patient done and very concerned about marked decrease in LV systolic function.  Although she had stated that she was asymptomatic on her previous office visit, after she had left her office she had noticed fatigue and dyspnea.  Presently no PND or orthopnea, I will start her on carvedilol 6.25 mg 1/2 tablet twice daily for 3 days followed by titrating it up to 6.25 mg p.o. twice daily. Schedule for a Lexiscan Sestamibi stress test to evaluate for myocardial ischemia. Patient unable to do treadmill stress testing due to LBBB.  I would like to see her back in the office in  2 weeks for follow-up.  We will also try to initiate Entresto going forward. Patient states she will bring in recent labs.   Adrian Prows, MD, [facility] 08/24/2018, 6:48 PM [facility]. [place] Pager: [phone] Office: [phone] If no answer Cell [phone]

## 2018-08-25 ENCOUNTER — Encounter: Payer: Self-pay | Admitting: Podiatry

## 2018-08-25 ENCOUNTER — Ambulatory Visit (INDEPENDENT_AMBULATORY_CARE_PROVIDER_SITE_OTHER): Payer: Medicare Other | Admitting: Podiatry

## 2018-08-25 VITALS — Temp 97.7°F

## 2018-08-25 DIAGNOSIS — B351 Tinea unguium: Secondary | ICD-10-CM | POA: Diagnosis not present

## 2018-08-25 DIAGNOSIS — M79676 Pain in unspecified toe(s): Secondary | ICD-10-CM | POA: Diagnosis not present

## 2018-08-25 DIAGNOSIS — M05772 Rheumatoid arthritis with rheumatoid factor of left ankle and foot without organ or systems involvement: Secondary | ICD-10-CM

## 2018-08-25 DIAGNOSIS — L97512 Non-pressure chronic ulcer of other part of right foot with fat layer exposed: Secondary | ICD-10-CM

## 2018-08-25 DIAGNOSIS — L97522 Non-pressure chronic ulcer of other part of left foot with fat layer exposed: Secondary | ICD-10-CM | POA: Diagnosis not present

## 2018-08-25 DIAGNOSIS — M05771 Rheumatoid arthritis with rheumatoid factor of right ankle and foot without organ or systems involvement: Secondary | ICD-10-CM

## 2018-08-29 NOTE — Progress Notes (Signed)
   HPI: 85 year old female presents to the office today for follow up evaluation of bilateral foot ulcerations. She states her right foot wound is improving. She reports some bleeding from the left foot wound and it is still tender to the touch. Using the dancer's pads have been helping the pain. She has been using Gentamicin cream as directed.  She also complains of elongated, thickened nails that cause pain while ambulating in shoes. She is unable to trim her own nails. Patient is here for further evaluation and treatment.   Past Medical History:  Diagnosis Date  . Allergy    seasonal  . Anemia   . Arthritis   . GERD (gastroesophageal reflux disease)   . Hyperlipidemia      Physical Exam: General: The patient is alert and oriented x3 in no acute distress.  Dermatology: Ulcer is noted to the bilateral sub-first MTPJ measuring approximately 0.3 x 0.2 x 0.1 cm.  Wound base is granular.  There is no exposed bone muscle tendon ligament or joint.  No malodor noted.  Periwound integrity is intact.   Nails are tender, long, thickened and dystrophic with subungual debris, consistent with onychomycosis, 1-5 bilateral. No signs of infection noted. Skin is warm, dry and supple bilateral lower extremities.   Vascular: Palpable pedal pulses bilaterally. No edema or erythema noted. Capillary refill within normal limits.  Neurological: Epicritic and protective threshold grossly intact bilaterally.   Musculoskeletal Exam: Range of motion within normal limits to all pedal and ankle joints bilateral. Muscle strength 5/5 in all groups bilateral.  Rheumatoid foot type with rigid contracture of the forefoot and hammertoes 1-5 bilateral.  Assessment: 1. Rheumatoid foot type bilateral 2. Ulcers sub-first MTPJ bilateral 3. Onychodystrophic nails 1-5 bilateral with hyperkeratosis of nails.  4. Onychomycosis of nail due to dermatophyte bilateral   Plan of Care:  1. Patient evaluated.  2. Medically  necessary excisional debridement including subcutaneous tissue was performed using a tissue nipper.  Excisional debridement of all necrotic nonviable tissue down to healthy bleeding viable tissue was performed with post debridement measurement same as pre-.  Light dressing applied. 3. Continue using offloading Dancer's pads.  4. Continue using Gentamicin cream.  5. Mechanical debridement of nails 1-5 bilaterally performed using a nail nipper. Filed with dremel without incident.  6. Return to clinic in 4 weeks.   *[name]'s name is Suanne Marker.      Edrick Kins, DPM [facility]  Dr. Edrick Kins, DPM    [address], [place] [address]                Office [phone]  Fax [phone]

## 2018-08-31 ENCOUNTER — Other Ambulatory Visit: Payer: Self-pay | Admitting: Cardiology

## 2018-08-31 DIAGNOSIS — I429 Cardiomyopathy, unspecified: Secondary | ICD-10-CM

## 2018-09-06 ENCOUNTER — Other Ambulatory Visit: Payer: Self-pay

## 2018-09-06 ENCOUNTER — Ambulatory Visit (INDEPENDENT_AMBULATORY_CARE_PROVIDER_SITE_OTHER): Payer: Medicare Other

## 2018-09-06 DIAGNOSIS — I429 Cardiomyopathy, unspecified: Secondary | ICD-10-CM | POA: Diagnosis not present

## 2018-09-06 DIAGNOSIS — I447 Left bundle-branch block, unspecified: Secondary | ICD-10-CM

## 2018-09-09 ENCOUNTER — Ambulatory Visit: Payer: Medicare Other | Admitting: Cardiology

## 2018-09-09 ENCOUNTER — Other Ambulatory Visit: Payer: Self-pay | Admitting: Cardiology

## 2018-09-09 ENCOUNTER — Encounter: Payer: Self-pay | Admitting: Cardiology

## 2018-09-09 ENCOUNTER — Other Ambulatory Visit: Payer: Self-pay

## 2018-09-09 VITALS — BP 122/67 | HR 73 | Ht 63.0 in | Wt 142.0 lb

## 2018-09-09 DIAGNOSIS — I5022 Chronic systolic (congestive) heart failure: Secondary | ICD-10-CM | POA: Diagnosis not present

## 2018-09-09 DIAGNOSIS — I429 Cardiomyopathy, unspecified: Secondary | ICD-10-CM

## 2018-09-09 DIAGNOSIS — I447 Left bundle-branch block, unspecified: Secondary | ICD-10-CM

## 2018-09-09 DIAGNOSIS — R0602 Shortness of breath: Secondary | ICD-10-CM

## 2018-09-09 DIAGNOSIS — I428 Other cardiomyopathies: Secondary | ICD-10-CM | POA: Diagnosis not present

## 2018-09-09 MED ORDER — BIDIL 20-37.5 MG PO TABS: 1.0000 | ORAL_TABLET | Freq: Three times a day (TID) | ORAL | 1 refills | Status: DC

## 2018-09-09 NOTE — Telephone Encounter (Signed)
Please fill

## 2018-09-09 NOTE — Progress Notes (Signed)
Virtual Visit via Telephone Note: Patient unable to use video assisted device.  This visit type was conducted due to national recommendations for restrictions regarding the COVID-19 Pandemic (e.g. social distancing).  This format is felt to be most appropriate for this patient at this time.  All issues noted in this document were discussed and addressed.  No physical exam was performed.  The patient has consented to conduct a Telehealth visit and understands insurance will be billed.   I connected with@, on 09/09/18 at  by TELEPHONE and verified that I am speaking with the correct person using two identifiers.   I discussed the limitations of evaluation and management by telemedicine and the availability of in person appointments. The patient expressed understanding and agreed to proceed.   I have discussed with patient regarding the safety during COVID Pandemic and steps and precautions to be taken including social distancing, frequent hand wash and use of detergent soap, gels with the patient. I asked the patient to avoid touching mouth, nose, eyes, ears with the hands. I encouraged regular walking around the neighborhood and exercise and regular diet, as long as social distancing can be maintained.  Primary Physician/Referring:  Jani Gravel, MD  Patient ID: Pamela Dunn, female    DOB: Mar 15, 1931, 85 y.o.   MRN: [id number]  Chief Complaint  Patient presents with  . Cardiomyopathy  . Follow-up    HPI: Pamela Dunn  is a 85 y.o. female  with with long-standing history of recurrent arthritis, who was referred to me for evaluation of new onset left bundle branch block.  Except for arthritis and her physical disability due to arthritis and using a cane to walk, also has pain in her hand joints.  Due to findings of new LBBB on routine physical exam compared to 2014, underwent echocardiogram in June revealed LVEF 20% and nuclear stress revealing dilated LV and minimal apical ischemia. Has  chronic dyspnea on exertion.  Denies PND or orthopnea or leg edema.  Past Medical History:  Diagnosis Date  . Allergy    seasonal  . Anemia   . Arthritis   . GERD (gastroesophageal reflux disease)   . Hyperlipidemia     Past Surgical History:  Procedure Laterality Date  . APPENDECTOMY    . BACK SURGERY      Social History   Socioeconomic History  . Marital status: Widowed    Spouse name: Not on file  . Number of children: 2  . Years of education: Not on file  . Highest education level: Not on file  Occupational History  . Not on file  Social Needs  . Financial resource strain: Not on file  . Food insecurity    Worry: Not on file    Inability: Not on file  . Transportation needs    Medical: Not on file    Non-medical: Not on file  Tobacco Use  . Smoking status: Never Smoker  . Smokeless tobacco: Never Used  Substance and Sexual Activity  . Alcohol use: No  . Drug use: No  . Sexual activity: Not on file  Lifestyle  . Physical activity    Days per week: Not on file    Minutes per session: Not on file  . Stress: Not on file  Relationships  . Social Herbalist on phone: Not on file    Gets together: Not on file    Attends religious service: Not on file    Active member of  club or organization: Not on file    Attends meetings of clubs or organizations: Not on file    Relationship status: Not on file  . Intimate partner violence    Fear of current or ex partner: Not on file    Emotionally abused: Not on file    Physically abused: Not on file    Forced sexual activity: Not on file  Other Topics Concern  . Not on file  Social History Narrative  . Not on file   Review of Systems  Constitution: Positive for malaise/fatigue. Negative for chills, decreased appetite and weight gain.  Cardiovascular: Positive for dyspnea on exertion. Negative for leg swelling and syncope.  Endocrine: Negative for cold intolerance.  Hematologic/Lymphatic: Does not  bruise/bleed easily.  Musculoskeletal: Positive for arthritis, joint pain and joint swelling.  Gastrointestinal: Negative for abdominal pain, anorexia, change in bowel habit, hematochezia and melena.  Neurological: Negative for headaches and light-headedness.  Psychiatric/Behavioral: Negative for depression and substance abuse.  All other systems reviewed and are negative.     Objective  Blood pressure 122/67, pulse 73, height 5' 3"  (1.6 m), weight 142 lb (64.4 kg), SpO2 97 %. Body mass index is 25.15 kg/m.     Physical Exam  Constitutional: She appears well-developed. No distress.  Mildly obese  HENT:  Head: Atraumatic.  Eyes: Conjunctivae are normal.  Neck: Neck supple. No JVD present. No thyromegaly present.  Cardiovascular: Normal rate, regular rhythm, normal heart sounds and intact distal pulses. Exam reveals no gallop.  No murmur heard. Pulmonary/Chest: Effort normal and breath sounds normal.  Abdominal: Soft. Bowel sounds are normal.  Musculoskeletal: Normal range of motion.        General: Deformity (burnt out RA in hands) present.  Neurological: She is alert.  Skin: Skin is warm and dry.  Psychiatric: She has a normal mood and affect.   Radiology: No results found.  Laboratory examination:  PCP 04/26/2018:  Glucose 81, BUN/Cr 21/1.09, eGFR 47, Na/K 144/4.8, AST 21, ALT 8. Alk Phos 50. Rest of CMP normal.  WBC 13.3, RBC 3.71, H/H 11.3/35.3, MCV 96, Platelets 347,  Rest of CBC normal. Total cholesterol 202 triglycerides 86, HDL 57, LDL 128.  Lipid Panel  No results found for: CHOL, TRIG, HDL, CHOLHDL, VLDL, LDLCALC, LDLDIRECT HEMOGLOBIN A1C No results found for: HGBA1C, MPG TSH No results for input(s): TSH in the last 8760 hours.   Medications   There are no discontinued medications. Current Meds  Medication Sig  . allopurinol (ZYLOPRIM) 100 MG tablet Take 100 mg by mouth daily.  . Aspirin-Caffeine (BAYER BACK & BODY PAIN EX ST) 500-32.5 MG TABS Take by mouth 2  (two) times a day.   . [place]-Glucosamine-Vit D (OSTEO BI-FLEX ONE PER DAY PO) Take 1 tablet by mouth daily.  . carvedilol (COREG) 6.25 MG tablet TAKE 1 TABLET BY MOUTH TWICE A DAY  . cholecalciferol (VITAMIN D) 1000 UNITS tablet Take 1,000 Units by mouth daily.   . Coenzyme Q10 (COQ10) 200 MG CAPS Take 200 mg by mouth daily.  . folic acid (FOLVITE) 1 MG tablet Take 1 mg by mouth 2 (two) times daily.  [name] Kitchen gentamicin cream (GARAMYCIN) 0.1 % Apply 1 application topically 2 (two) times daily.  [name] Kitchen levothyroxine (SYNTHROID) 25 MCG tablet Take 25 mcg by mouth daily.   . methotrexate (RHEUMATREX) 2.5 MG tablet TAKE 6 TABLETS BY MOUTH ONCE WEEKLY  . Multiple Vitamins-Minerals (PRESERVISION AREDS 2 PO) Take 1 tablet by mouth daily.  . Omega-3 Fatty  Acids (FISH OIL PO) Take by mouth daily.  . predniSONE (DELTASONE) 2.5 MG tablet Take 2.5 mg by mouth daily.     Cardiac Studies:   Lexiscan Myoview Stress Test 09/06/2018: Resting EKG demonstrates normal sinus rhythm.  Left bundle branch block.  Stress EKG is non-diagnostic as it's a pharmacologic stress test. Nuclear images reveal left ventricle to be mildly dilated both in rest and stress images with end-diastolic volume of 973 mL.  The perfusion imaging study demonstrates a moderate sized inferior and inferolateral decreased uptake suggestive of soft tissue attenuation.  In addition there is a very small sized apical septal defect suggestive of ischemia. The left ventricle systolic function calculated by QGS was 24% with global hypokinesis. This represents a high risk study in view of low EF, clinical correlation recommended. Findings may represent non ischemic dilated cardiomyopathy.  Echocardiogram 08/11/2018 :  Severely depressed LV systolic function with EF 20%. Left ventricle cavity is normal in size. Moderate concentric hypertrophy of the left ventricle. Doppler evidence of grade I (impaired) diastolic dysfunction, elevated LAP. There is diffuse  hypokinesis. There is anterior, septal, apical akinesis.  Calculated EF 20%. Trileaflet aortic valve. Trace aortic regurgitation. Structurally normal appearing mitral valve. Moderate to severe anteriorly directed mitral regurgitation. Consider papillary muscle dysfunction. Mild tricuspid regurgitation. No evidence of pulmonary hypertension. Compared to 07/14/2012, EF was previously normal.  CXR PA/LAT 07/15/17: Cardiac shadow is within normal limits. Mild aortic calcifications are seen. A partially calcified nodule is faintly visualized in the left upper lobe adjacent to the fissure. Some minimal scarring is noted in the right lung base and left mid lung. No focal confluent infiltrate or effusion is seen. No bony abnormality is noted. The changes seen in the right lower lobe previously are less well visualized. No bony abnormality is noted.  IMPRESSION: Some improvement when compare with the prior CT examination. Follow-up chest CT is again recommended in July of 2019 to assess for resolution/stability.  CT Chest with contrast 06/11/2017: 1. Patchy nodular perilobular consolidation, ground-glass opacity and tree-in-bud opacities throughout both lungs, most prominent in the right lower lobe. A resolving multilobar bronchopneumonia is favored. Cryptogenic organizing pneumonia and neoplasm are considered less likely. Recommend follow-up chest CT in 3 months given the nodular character of the lung opacities. 2. Small hiatal hernia. 3. Two-vessel coronary atherosclerosis.  Lower Extremity Venous Duplex Bilateral at [facility] 07/14/2012: No evidence of thrombus or thrombophlebitis.  Assessment   1. Chronic systolic heart failure ([place])   2. Non-ischemic cardiomyopathy ([place])   3. LBBB (left bundle branch block)   4. Shortness of breath    EKG 05/21/2018: Normal sinus rhythm at rate of 79 bpm, right atrial enlargement, left bundle branch block.  No further analysis.  Abnormal EKG. No significant change  from  EKG 05/03/2018.  Recommendations:   Patient was initially referred to me 3 months ago for evaluation of new onset left bundle branch block compared to 2014.   I reviewed the results of the echocardiogram Again and also recently performed stress test.  The stress test does not explain her cardiomyopathy or markedly reduced LVEF.  Do not suspect CAD.  She is having frequent episodes of diarrhea for the past 3-4 days and wondering whether carvedilol is causing of the diarrhea.  Advised her to hold the carvedilol for 3-4 days and if diarrhea resolves she can start Coreg at one half tablet b.i.d. for 2-3 days and then increase it to 1 tablet b.i.d. at 6.25 mg.  She has  been told to have high potassium levels, I do not have a recent labs, her potassium level was marginally high urine previously, I'm concerned about starting her on ace inhibitors or ARB hence I'll start her on BiDil 1 tablet b.i.d. for 2-3 days and if she tolerates this she'll increase it to t.i.d. dosing.  Samples were given.  I'd like to see her back in 2 weeks for up titration of the medication.  After maximal medical therapy, with repeat echocardiogram and if there is no change in EF, she will be a candidate for bi-V pacemaker implantation.  Adrian Prows, MD, [facility] 09/09/2018, 11:40 AM [facility] [place] Pager: [phone] Office: [phone] If no answer Cell [phone]

## 2018-09-22 ENCOUNTER — Ambulatory Visit (INDEPENDENT_AMBULATORY_CARE_PROVIDER_SITE_OTHER): Payer: Medicare Other | Admitting: Podiatry

## 2018-09-22 ENCOUNTER — Encounter: Payer: Self-pay | Admitting: Podiatry

## 2018-09-22 ENCOUNTER — Other Ambulatory Visit: Payer: Self-pay

## 2018-09-22 VITALS — Temp 98.0°F

## 2018-09-22 DIAGNOSIS — L97522 Non-pressure chronic ulcer of other part of left foot with fat layer exposed: Secondary | ICD-10-CM

## 2018-09-22 DIAGNOSIS — M05772 Rheumatoid arthritis with rheumatoid factor of left ankle and foot without organ or systems involvement: Secondary | ICD-10-CM

## 2018-09-22 DIAGNOSIS — L97512 Non-pressure chronic ulcer of other part of right foot with fat layer exposed: Secondary | ICD-10-CM | POA: Diagnosis not present

## 2018-09-23 ENCOUNTER — Other Ambulatory Visit: Payer: Self-pay | Admitting: Cardiology

## 2018-09-23 ENCOUNTER — Encounter: Payer: Self-pay | Admitting: Cardiology

## 2018-09-23 ENCOUNTER — Ambulatory Visit (INDEPENDENT_AMBULATORY_CARE_PROVIDER_SITE_OTHER): Payer: Medicare Other | Admitting: Cardiology

## 2018-09-23 VITALS — BP 150/63 | HR 80 | Temp 98.2°F | Ht 64.0 in | Wt 144.0 lb

## 2018-09-23 DIAGNOSIS — I428 Other cardiomyopathies: Secondary | ICD-10-CM

## 2018-09-23 DIAGNOSIS — I447 Left bundle-branch block, unspecified: Secondary | ICD-10-CM

## 2018-09-23 DIAGNOSIS — I429 Cardiomyopathy, unspecified: Secondary | ICD-10-CM

## 2018-09-23 DIAGNOSIS — I5022 Chronic systolic (congestive) heart failure: Secondary | ICD-10-CM

## 2018-09-23 MED ORDER — METOPROLOL SUCCINATE ER 50 MG PO TB24: 50.0000 mg | ORAL_TABLET | Freq: Every day | ORAL | 2 refills | Status: DC

## 2018-09-23 MED ORDER — BIDIL 20-37.5 MG PO TABS: 1.0000 | ORAL_TABLET | Freq: Three times a day (TID) | ORAL | 2 refills | Status: DC

## 2018-09-23 NOTE — Telephone Encounter (Signed)
Please fill

## 2018-09-23 NOTE — Progress Notes (Signed)
Primary Physician/Referring:  Jani Gravel, MD  Patient ID: Pamela Dunn, female    DOB: 09-08-1931, 85 y.o.   MRN: [id number]  Chief Complaint  Patient presents with  . Congestive Heart Failure  . Follow-up    2wks    HPI: Pamela Dunn  is a 85 y.o. female  with with long-standing history of recurrent arthritis, who was referred to me for evaluation of new onset left bundle branch block.  Except for arthritis and her physical disability due to arthritis and using a cane to walk, also has pain in her hand joints.  Due to findings of new LBBB on routine physical exam compared to 2014, underwent echocardiogram in June revealed LVEF 20% and nuclear stress revealing dilated LV and minimal apical ischemia. Has chronic dyspnea on exertion.  Denies PND or orthopnea or leg edema. She now presents for a two-week follow-up after had started her on Coreg and BiDil.  She is taking BiDil b.i.d. due to dizziness and Coreg has caused her to have increased frequency of stools.  Overall denies any worsening dyspnea, leg edema, PND or orthopnea.  Past Medical History:  Diagnosis Date  . Allergy    seasonal  . Anemia   . Arthritis   . GERD (gastroesophageal reflux disease)   . Hyperlipidemia     Past Surgical History:  Procedure Laterality Date  . APPENDECTOMY    . BACK SURGERY      Social History   Socioeconomic History  . Marital status: Widowed    Spouse name: Not on file  . Number of children: 2  . Years of education: Not on file  . Highest education level: Not on file  Occupational History  . Not on file  Social Needs  . Financial resource strain: Not on file  . Food insecurity    Worry: Not on file    Inability: Not on file  . Transportation needs    Medical: Not on file    Non-medical: Not on file  Tobacco Use  . Smoking status: Never Smoker  . Smokeless tobacco: Never Used  Substance and Sexual Activity  . Alcohol use: No  . Drug use: No  . Sexual activity: Not on file   Lifestyle  . Physical activity    Days per week: Not on file    Minutes per session: Not on file  . Stress: Not on file  Relationships  . Social Herbalist on phone: Not on file    Gets together: Not on file    Attends religious service: Not on file    Active member of club or organization: Not on file    Attends meetings of clubs or organizations: Not on file    Relationship status: Not on file  . Intimate partner violence    Fear of current or ex partner: Not on file    Emotionally abused: Not on file    Physically abused: Not on file    Forced sexual activity: Not on file  Other Topics Concern  . Not on file  Social History Narrative  . Not on file   Review of Systems  Constitution: Positive for malaise/fatigue. Negative for chills, decreased appetite and weight gain.  Cardiovascular: Positive for dyspnea on exertion. Negative for leg swelling and syncope.  Endocrine: Negative for cold intolerance.  Hematologic/Lymphatic: Does not bruise/bleed easily.  Musculoskeletal: Positive for arthritis, joint pain and joint swelling.  Gastrointestinal: Negative for abdominal pain, anorexia, change in  bowel habit, hematochezia and melena.  Neurological: Negative for headaches and light-headedness.  Psychiatric/Behavioral: Negative for depression and substance abuse.  All other systems reviewed and are negative.     Objective  Blood pressure (!) 150/63, pulse 80, temperature 98.2 F (36.8 C), height _0  (1.626 m), weight 144 lb (65.3 kg), SpO2 94 %. Body mass index is 24.72 kg/m.     Physical Exam  Constitutional: She appears well-developed. No distress.  Mildly obese  HENT:  Head: Atraumatic.  Eyes: Conjunctivae are normal.  Neck: Neck supple. No JVD present. No thyromegaly present.  Cardiovascular: Normal rate, regular rhythm and intact distal pulses. Exam reveals no gallop.  No murmur heard. S1 is normal, S2 is paradoxically split.  Pulmonary/Chest: Effort  normal and breath sounds normal.  Abdominal: Soft. Bowel sounds are normal.  Musculoskeletal: Normal range of motion.        General: Deformity (burnt out RA in hands) present.  Neurological: She is alert.  Skin: Skin is warm and dry.  Psychiatric: She has a normal mood and affect.   Radiology: No results found.  Laboratory examination:  PCP 04/26/2018:  Glucose 81, BUN/Cr 21/1.09, eGFR 47, Na/K 144/4.8, AST 21, ALT 8. Alk Phos 50. Rest of CMP normal.  WBC 13.3, RBC 3.71, H/H 11.3/35.3, MCV 96, Platelets 347,  Rest of CBC normal. Total cholesterol 202 triglycerides 86, HDL 57, LDL 128.  Lipid Panel  No results found for: CHOL, TRIG, HDL, CHOLHDL, VLDL, LDLCALC, LDLDIRECT HEMOGLOBIN A1C No results found for: HGBA1C, MPG TSH No results for input(s): TSH in the last 8760 hours.   Medications   Medications Discontinued During This Encounter  Medication Reason  . methotrexate (RHEUMATREX) 2.5 MG tablet Discontinued by provider  . carvedilol (COREG) 6.25 MG tablet Discontinued by provider  . isosorbide-hydrALAZINE (BIDIL) 20-37.5 MG tablet Reorder   Current Meds  Medication Sig  . allopurinol (ZYLOPRIM) 100 MG tablet Take 100 mg by mouth daily.  . Aspirin-Caffeine (BAYER BACK & BODY PAIN EX ST) 500-32.5 MG TABS Take by mouth 2 (two) times a day.   . [place]-Glucosamine-Vit D (OSTEO BI-FLEX ONE PER DAY PO) Take 1 tablet by mouth daily.  . cholecalciferol (VITAMIN D) 1000 UNITS tablet Take 1,000 Units by mouth daily.   . Coenzyme Q10 (COQ10) 200 MG CAPS Take 200 mg by mouth daily.  . folic acid (FOLVITE) 1 MG tablet Take 1 mg by mouth 2 (two) times daily.  [name] Kitchen gentamicin cream (GARAMYCIN) 0.1 % Apply 1 application topically 2 (two) times daily.  . isosorbide-hydrALAZINE (BIDIL) 20-37.5 MG tablet Take 1 tablet by mouth 3 (three) times daily.  [name] Kitchen levothyroxine (SYNTHROID) 25 MCG tablet Take 25 mcg by mouth daily.   . Multiple Vitamins-Minerals (PRESERVISION AREDS 2 PO) Take 1 tablet by  mouth daily.  . Omega-3 Fatty Acids (FISH OIL PO) Take by mouth daily.  . predniSONE (DELTASONE) 2.5 MG tablet Take 2.5 mg by mouth daily.   . [DISCONTINUED] carvedilol (COREG) 6.25 MG tablet TAKE 1 TABLET BY MOUTH TWICE A DAY (Patient taking differently: 0.5 mg 2 (two) times a day. )  . [DISCONTINUED] isosorbide-hydrALAZINE (BIDIL) 20-37.5 MG tablet Take 1 tablet by mouth 3 (three) times daily. (Patient taking differently: Take 1 tablet by mouth 2 (two) times a day. )    Cardiac Studies:   CXR PA/LAT 07/15/17: Cardiac shadow is within normal limits. Mild aortic calcifications are seen. A partially calcified nodule is faintly visualized in the left upper lobe adjacent to the fissure.  Some minimal scarring is noted in the right lung base and left mid lung. No focal confluent infiltrate or effusion is seen. No bony abnormality is noted. The changes seen in the right lower lobe previously are less well visualized. No bony abnormality is noted.  IMPRESSION: Some improvement when compare with the prior CT examination. Follow-up chest CT is again recommended in July of 2019 to assess for resolution/stability.  CT Chest with contrast 06/11/2017: 1. Patchy nodular perilobular consolidation, ground-glass opacity and tree-in-bud opacities throughout both lungs, most prominent in the right lower lobe. A resolving multilobar bronchopneumonia is favored. Cryptogenic organizing pneumonia and neoplasm are considered less likely. Recommend follow-up chest CT in 3 months given the nodular character of the lung opacities. 2. Small hiatal hernia. 3. Two-vessel coronary atherosclerosis.  Lower Extremity Venous Duplex Bilateral at [facility] 07/14/2012: No evidence of thrombus or thrombophlebitis.  Lexiscan Myoview Stress Test 09/06/2018: Resting EKG demonstrates normal sinus rhythm.  Left bundle branch block.  Stress EKG is non-diagnostic as it's a pharmacologic stress test. Nuclear images reveal left ventricle to  be mildly dilated both in rest and stress images with end-diastolic volume of 737 mL.  The perfusion imaging study demonstrates a moderate sized inferior and inferolateral decreased uptake suggestive of soft tissue attenuation.  In addition there is a very small sized apical septal defect suggestive of ischemia. The left ventricle systolic function calculated by QGS was 24% with global hypokinesis. This represents a high risk study in view of low EF, clinical correlation recommended. Findings may represent non ischemic dilated cardiomyopathy.  Echocardiogram 08/11/2018 :  Severely depressed LV systolic function with EF 20%. Left ventricle cavity is normal in size. Moderate concentric hypertrophy of the left ventricle. Doppler evidence of grade I (impaired) diastolic dysfunction, elevated LAP. There is diffuse hypokinesis. There is anterior, septal, apical akinesis.  Calculated EF 20%. Trileaflet aortic valve. Trace aortic regurgitation. Structurally normal appearing mitral valve. Moderate to severe anteriorly directed mitral regurgitation. Consider papillary muscle dysfunction. Mild tricuspid regurgitation. No evidence of pulmonary hypertension. Compared to 07/14/2012, EF was previously normal.  Assessment     ICD-10-CM   1. Chronic systolic heart failure (HCC)  I50.22 metoprolol succinate (TOPROL-XL) 50 MG 24 hr tablet    isosorbide-hydrALAZINE (BIDIL) 20-37.5 MG tablet    Obtain medical records  2. Non-ischemic cardiomyopathy ([place])  I42.8   3. LBBB (left bundle branch block)  I44.7    EKG 05/21/2018: Normal sinus rhythm at rate of 79 bpm, right atrial enlargement, left bundle branch block.  No further analysis.  Abnormal EKG. No significant change from  EKG 05/03/2018.  Recommendations:   Patient with nonischemic nuclear stress test and echocardiogram revealing severe LV systolic dysfunction in June 2020 presenting with left bundle branch block, no being treated for nonischemic cardiomyopathy.   Patient has developed diarrhea with Coreg was changed to metoprolol succinate.  She had dizziness probably related to low blood pressure with BiDil t.i.d., advised to continue with b.i.d. dosing until metoprolol is tolerated and once she does that she can titrate BiDil to t.i.d. dosing.  I have not added Ace inhibitors or ARB or Entresto as she has class II symptoms but has been told to have hyperkalemia by recent lab test which I'll try to obtain.  I'd like to see her back in 6 weeks for up titration of the medication.  After maximal medical therapy, with repeat echocardiogram and if there is no change in EF, she will be a candidate for bi-V pacemaker implantation.  Adrian Prows, MD, [facility] 09/23/2018, 4:47 PM [facility] [place] Pager: [phone] Office: [phone] If no answer Cell [phone]

## 2018-09-25 NOTE — Progress Notes (Signed)
   HPI: 85 year old female presents to the office today for follow up evaluation of bilateral foot ulcerations. She states she is doing well overall. She reports the wound to the right foot seems to be healed. She has been applying Gentamicin cream to the wound of the left foot which seems to be helping. There are no worsening factors noted. Patient is here for further evaluation and treatment.   Past Medical History:  Diagnosis Date  . Allergy    seasonal  . Anemia   . Arthritis   . GERD (gastroesophageal reflux disease)   . Hyperlipidemia      Physical Exam: General: The patient is alert and oriented x3 in no acute distress.  Dermatology: Wound noted to the sub-first MPJ of the right foot has healed. Complete re-epithelialization has occurred. No drainage noted.   Wound #1 noted to the sub-first MPJ of the left foot measuring 0.6 x 0.6 x 0.1 cm.   To the above-noted ulceration, there is no eschar. There is a moderate amount of slough, fibrin and necrotic tissue. Granulation tissue and wound base is red. There is no malodor. There is a minimal amount of serosanginous drainage noted. Periwound integrity is intact.  Skin is warm, dry and supple bilateral lower extremities.   Vascular: Palpable pedal pulses bilaterally. No edema or erythema noted. Capillary refill within normal limits.  Neurological: Epicritic and protective threshold grossly intact bilaterally.   Musculoskeletal Exam: Range of motion within normal limits to all pedal and ankle joints bilateral. Muscle strength 5/5 in all groups bilateral.  Rheumatoid foot type with rigid contracture of the forefoot and hammertoes 1-5 bilateral.  Assessment: 1. Rheumatoid foot type bilateral 2. Ulcers sub-first MTPJ left  3. Onychodystrophic nails 1-5 bilateral with hyperkeratosis of nails.   Plan of Care:  1. Patient evaluated.  2. Medically necessary excisional debridement including subcutaneous tissue was performed using a  tissue nipper.  Excisional debridement of all necrotic nonviable tissue down to healthy bleeding viable tissue was performed with post debridement measurement same as pre-.  Light dressing applied. 3. Continue using offloading Dancer's pads.  4. Continue using Gentamicin cream.  5. Return to clinic in 3 weeks.   *[name]'s name is Suanne Marker.      Edrick Kins, DPM [facility]  Dr. Edrick Kins, DPM    [address], [place] [address]                Office [phone]  Fax [phone]

## 2018-09-27 ENCOUNTER — Ambulatory Visit: Payer: Medicare Other | Admitting: Cardiology

## 2018-09-27 ENCOUNTER — Other Ambulatory Visit: Payer: Self-pay

## 2018-09-27 DIAGNOSIS — I5022 Chronic systolic (congestive) heart failure: Secondary | ICD-10-CM

## 2018-09-27 MED ORDER — BIDIL 20-37.5 MG PO TABS: 1.0000 | ORAL_TABLET | Freq: Three times a day (TID) | ORAL | 2 refills | Status: DC

## 2018-10-18 ENCOUNTER — Other Ambulatory Visit: Payer: Self-pay

## 2018-10-18 ENCOUNTER — Encounter: Payer: Self-pay | Admitting: Podiatry

## 2018-10-18 ENCOUNTER — Ambulatory Visit (INDEPENDENT_AMBULATORY_CARE_PROVIDER_SITE_OTHER): Payer: Medicare Other | Admitting: Podiatry

## 2018-10-18 VITALS — Temp 98.1°F

## 2018-10-18 DIAGNOSIS — M05772 Rheumatoid arthritis with rheumatoid factor of left ankle and foot without organ or systems involvement: Secondary | ICD-10-CM

## 2018-10-18 DIAGNOSIS — L97522 Non-pressure chronic ulcer of other part of left foot with fat layer exposed: Secondary | ICD-10-CM | POA: Diagnosis not present

## 2018-10-18 DIAGNOSIS — L97512 Non-pressure chronic ulcer of other part of right foot with fat layer exposed: Secondary | ICD-10-CM

## 2018-10-20 NOTE — Progress Notes (Signed)
   HPI: 85 year old female presents to the office today for follow up evaluation of bilateral foot ulcerations. She states she is doing very well. She denies any pain or modifying factors. She has been using the Gentamicin cream and dancer's pads as directed. She denies any new complaints at this time. Patient is here for further evaluation and treatment.   Past Medical History:  Diagnosis Date  . Allergy    seasonal  . Anemia   . Arthritis   . GERD (gastroesophageal reflux disease)   . Hyperlipidemia      Physical Exam: General: The patient is alert and oriented x3 in no acute distress.  Dermatology: Wound noted to the sub-first MPJ of the left foot has healed. Complete re-epithelialization has occurred. No drainage noted.   Vascular: Palpable pedal pulses bilaterally. No edema or erythema noted. Capillary refill within normal limits.  Neurological: Epicritic and protective threshold grossly intact bilaterally.   Musculoskeletal Exam: Range of motion within normal limits to all pedal and ankle joints bilateral. Muscle strength 5/5 in all groups bilateral.  Rheumatoid foot type with rigid contracture of the forefoot and hammertoes 1-5 bilateral.  Assessment: 1. Rheumatoid foot type bilateral 2. Ulcers sub-first MTPJ left - healed   Plan of Care:  1. Patient evaluated.  2. Continue using dancer's pads bilaterally.  3. Recommended insoles from [employer].  4. Return to clinic as needed.    *[name]'s name is Suanne Marker.      Edrick Kins, DPM [facility]  Dr. Edrick Kins, DPM    [address], [place] [address]                Office [phone]  Fax [phone]

## 2018-10-25 ENCOUNTER — Other Ambulatory Visit: Payer: Self-pay | Admitting: Internal Medicine

## 2018-10-25 DIAGNOSIS — Z1231 Encounter for screening mammogram for malignant neoplasm of breast: Secondary | ICD-10-CM

## 2018-11-11 ENCOUNTER — Encounter: Payer: Self-pay | Admitting: Cardiology

## 2018-11-11 ENCOUNTER — Other Ambulatory Visit: Payer: Self-pay

## 2018-11-11 ENCOUNTER — Ambulatory Visit (INDEPENDENT_AMBULATORY_CARE_PROVIDER_SITE_OTHER): Payer: Medicare Other | Admitting: Cardiology

## 2018-11-11 VITALS — BP 154/61 | HR 60 | Ht 64.0 in | Wt 140.0 lb

## 2018-11-11 DIAGNOSIS — R5383 Other fatigue: Secondary | ICD-10-CM

## 2018-11-11 DIAGNOSIS — R5381 Other malaise: Secondary | ICD-10-CM

## 2018-11-11 DIAGNOSIS — I447 Left bundle-branch block, unspecified: Secondary | ICD-10-CM | POA: Diagnosis not present

## 2018-11-11 DIAGNOSIS — I428 Other cardiomyopathies: Secondary | ICD-10-CM | POA: Diagnosis not present

## 2018-11-11 DIAGNOSIS — I5022 Chronic systolic (congestive) heart failure: Secondary | ICD-10-CM

## 2018-11-11 NOTE — Progress Notes (Signed)
Primary Physician/Referring:  Jani Gravel, MD  Patient ID: Pamela Dunn, female    DOB: 1931/11/27, 85 y.o.   MRN: [id number]  Chief Complaint  Patient presents with  . Chronic systolic heart failure    bruising needs tooth pulled does she need antibiotic    HPI: Pamela Dunn  is a 85 y.o. female  with with long-standing history of recurrent rheumatoid arthritis, new onset left bundle branch block in the past 6-8 months.  Except for arthritis and her physical disability due to arthritis and using a cane to walk, also has pain in her hand joints.  Due to findings of new LBBB on routine physical exam compared to 2014, underwent echocardiogram in June revealed LVEF 20% and nuclear stress revealing dilated LV and minimal apical ischemia. Has chronic dyspnea on exertion.  Denies PND or orthopnea or leg edema. She now presents for a 6-week follow-up after had started her on Coreg and BiDil.  She is taking BiDil b.i.d. due to dizziness. C/O marked fatigue that is relatively new to her. Could ont tolerate coreg due to diarrhea.   Past Medical History:  Diagnosis Date  . Allergy    seasonal  . Anemia   . Arthritis   . GERD (gastroesophageal reflux disease)   . Hyperlipidemia     Past Surgical History:  Procedure Laterality Date  . APPENDECTOMY    . BACK SURGERY      Social History   Socioeconomic History  . Marital status: Widowed    Spouse name: Not on file  . Number of children: 2  . Years of education: Not on file  . Highest education level: Not on file  Occupational History  . Not on file  Social Needs  . Financial resource strain: Not on file  . Food insecurity    Worry: Not on file    Inability: Not on file  . Transportation needs    Medical: Not on file    Non-medical: Not on file  Tobacco Use  . Smoking status: Never Smoker  . Smokeless tobacco: Never Used  Substance and Sexual Activity  . Alcohol use: No  . Drug use: No  . Sexual activity: Not on file   Lifestyle  . Physical activity    Days per week: Not on file    Minutes per session: Not on file  . Stress: Not on file  Relationships  . Social Herbalist on phone: Not on file    Gets together: Not on file    Attends religious service: Not on file    Active member of club or organization: Not on file    Attends meetings of clubs or organizations: Not on file    Relationship status: Not on file  . Intimate partner violence    Fear of current or ex partner: Not on file    Emotionally abused: Not on file    Physically abused: Not on file    Forced sexual activity: Not on file  Other Topics Concern  . Not on file  Social History Narrative  . Not on file   Review of Systems  Constitution: Positive for malaise/fatigue. Negative for chills, decreased appetite and weight gain.  Cardiovascular: Positive for dyspnea on exertion. Negative for leg swelling and syncope.  Endocrine: Negative for cold intolerance.  Hematologic/Lymphatic: Does not bruise/bleed easily.  Musculoskeletal: Positive for arthritis, joint pain and joint swelling.  Gastrointestinal: Negative for abdominal pain, anorexia, change in bowel habit,  hematochezia and melena.  Neurological: Negative for headaches and light-headedness.  Psychiatric/Behavioral: Negative for depression and substance abuse.  All other systems reviewed and are negative.     Objective  Blood pressure (!) 154/61, pulse 60, height 5' 4" (1.626 m), weight 140 lb (63.5 kg), SpO2 96 %. Body mass index is 24.03 kg/m.     Physical Exam  Constitutional: She appears well-developed. No distress.  Mildly obese  HENT:  Head: Atraumatic.  Eyes: Conjunctivae are normal.  Neck: Neck supple. No JVD present. No thyromegaly present.  Cardiovascular: Normal rate, regular rhythm and intact distal pulses. Exam reveals no gallop.  No murmur heard. S1 is normal, S2 is paradoxically split.  Pulmonary/Chest: Effort normal and breath sounds normal.   Abdominal: Soft. Bowel sounds are normal.  Musculoskeletal: Normal range of motion.        General: Deformity (burnt out RA in hands) present.  Neurological: She is alert.  Skin: Skin is warm and dry.  Psychiatric: She has a normal mood and affect.   Radiology: No results found.  Laboratory examination:  Labs 10/25/2018: Serum glucose 76, BUN 27, creatinine 1.08, eGFR 47 mL, potassium 4.6, HB 11.8, HCT 36.5, platelets 288.  Normal indicis.  Total cholesterol 205, triglycerides 224, HDL 49, LDL 111. TSH minimally elevated at 5.990.  PCP 04/26/2018:  Glucose 81, BUN/Cr 21/1.09, eGFR 47, Na/K 144/4.8, AST 21, ALT 8. Alk Phos 50. Rest of CMP normal.  WBC 13.3, RBC 3.71, H/H 11.3/35.3, MCV 96, Platelets 347,  Rest of CBC normal. Total cholesterol 202 triglycerides 86, HDL 57, LDL 128.  Lipid Panel  No results found for: CHOL, TRIG, HDL, CHOLHDL, VLDL, LDLCALC, LDLDIRECT HEMOGLOBIN A1C No results found for: HGBA1C, MPG TSH No results for input(s): TSH in the last 8760 hours.   Medications   Medications Discontinued During This Encounter  Medication Reason  . gentamicin cream (GARAMYCIN) 0.1 % Error  . allopurinol (ZYLOPRIM) 100 MG tablet Error  . predniSONE (DELTASONE) 2.5 MG tablet Dose change   Current Meds  Medication Sig  . Aspirin-Caffeine (BAYER BACK & BODY PAIN EX ST) 500-32.5 MG TABS Take by mouth 2 (two) times a day.   . [place]-Glucosamine-Vit D (OSTEO BI-FLEX ONE PER DAY PO) Take 1 tablet by mouth daily.  . cholecalciferol (VITAMIN D) 1000 UNITS tablet Take 1,000 Units by mouth daily.   . Coenzyme Q10 (COQ10) 200 MG CAPS Take 200 mg by mouth daily.  . folic acid (FOLVITE) 1 MG tablet Take 1 mg by mouth 2 (two) times daily.  . isosorbide-hydrALAZINE (BIDIL) 20-37.5 MG tablet Take 1 tablet by mouth 3 (three) times daily. (Patient taking differently: Take 1 tablet by mouth 2 (two) times daily. )  . levothyroxine (SYNTHROID) 25 MCG tablet Take 25 mcg by mouth daily.   [name] Kitchen  levothyroxine (SYNTHROID) 50 MCG tablet Take 50 mcg by mouth daily before breakfast.  . metoprolol succinate (TOPROL-XL) 50 MG 24 hr tablet Take 1 tablet (50 mg total) by mouth daily. Take with or immediately following a meal.  . Multiple Vitamins-Minerals (PRESERVISION AREDS 2 PO) Take 1 tablet by mouth daily.  . Omega-3 Fatty Acids (FISH OIL PO) Take by mouth daily.  . [DISCONTINUED] predniSONE (DELTASONE) 2.5 MG tablet Take 5 mg by mouth daily.     Cardiac Studies:   CXR PA/LAT 07/15/17: Cardiac shadow is within normal limits. Mild aortic calcifications are seen. A partially calcified nodule is faintly visualized in the left upper lobe adjacent to the fissure. Some minimal  scarring is noted in the right lung base and left mid lung. No focal confluent infiltrate or effusion is seen. No bony abnormality is noted. The changes seen in the right lower lobe previously are less well visualized. No bony abnormality is noted.  IMPRESSION: Some improvement when compare with the prior CT examination. Follow-up chest CT is again recommended in July of 2019 to assess for resolution/stability.  CT Chest with contrast 06/11/2017: 1. Patchy nodular perilobular consolidation, ground-glass opacity and tree-in-bud opacities throughout both lungs, most prominent in the right lower lobe. A resolving multilobar bronchopneumonia is favored. Cryptogenic organizing pneumonia and neoplasm are considered less likely. Recommend follow-up chest CT in 3 months given the nodular character of the lung opacities. 2. Small hiatal hernia. 3. Two-vessel coronary atherosclerosis.  Lower Extremity Venous Duplex Bilateral at [facility] 07/14/2012: No evidence of thrombus or thrombophlebitis.  Lexiscan Myoview Stress Test 09/06/2018: Resting EKG demonstrates normal sinus rhythm.  Left bundle branch block.  Stress EKG is non-diagnostic as it's a pharmacologic stress test. Nuclear images reveal left ventricle to be mildly dilated both  in rest and stress images with end-diastolic volume of 106 mL.  The perfusion imaging study demonstrates a moderate sized inferior and inferolateral decreased uptake suggestive of soft tissue attenuation.  In addition there is a very small sized apical septal defect suggestive of ischemia. The left ventricle systolic function calculated by QGS was 24% with global hypokinesis. This represents a high risk study in view of low EF, clinical correlation recommended. Findings may represent non ischemic dilated cardiomyopathy.  Echocardiogram 08/11/2018 :  Severely depressed LV systolic function with EF 20%. Left ventricle cavity is normal in size. Moderate concentric hypertrophy of the left ventricle. Doppler evidence of grade I (impaired) diastolic dysfunction, elevated LAP. There is diffuse hypokinesis. There is anterior, septal, apical akinesis.  Calculated EF 20%. Trileaflet aortic valve. Trace aortic regurgitation. Structurally normal appearing mitral valve. Moderate to severe anteriorly directed mitral regurgitation. Consider papillary muscle dysfunction. Mild tricuspid regurgitation. No evidence of pulmonary hypertension. Compared to 07/14/2012, EF was previously normal.  Assessment     ICD-10-CM   1. Chronic systolic heart failure (HCC)  I50.22 Pulse oximetry, overnight    PCV ECHOCARDIOGRAM COMPLETE  2. Non-ischemic cardiomyopathy (HCC)  I42.8 PCV ECHOCARDIOGRAM COMPLETE  3. LBBB (left bundle branch block)  I44.7   4. Malaise and fatigue  R53.81 Pulse oximetry, overnight   R53.83    EKG 05/21/2018: Normal sinus rhythm at rate of 79 bpm, right atrial enlargement, left bundle branch block.  No further analysis.  Abnormal EKG. No significant change from  EKG 05/03/2018.  Recommendations:   Patient with nonischemic nuclear stress test and echocardiogram revealing severe LV systolic dysfunction in June 2020 presenting with left bundle branch block, no being treated for nonischemic cardiomyopathy.   Patient seen by me on 09/23/2018, now here on a 6 week follow-up visit for titration of medications. Unable to tolerate potassium sparing drugs including Ace inhibitors and ARB's due to hyperkalemia.    I reviewed her latest labs from the PCP, she doesn't stage III chronic kidney disease, potassium levels are normal. She is now on metoprolol succinate 50 mg daily and BiDil, one p.o. t.i.d., was reduced one p.o. b.i.d. on her last office visit due to low blood pressure And dizziness andunable to tolerate to maximum recommended dose.  I advised her to try taking BiDil 3 times a day.  I will repeat echocardiogram. With repeat echocardiogram and if there is no change in EF,  she will be a candidate for bi-V pacemaker implantation. She also could not tolerate Coreg due to diarrhea.  She complains of continued fatigue, she may have nocturnal hypoxemia in view of congestive heart failure, I'll perform nocturnal oximetry.  I will see her back in 6 weeks. No endocarditis prophylaxis needed.  Adrian Prows, MD, [facility] 11/11/2018, 1:58 PM [facility] [place] Pager: [phone] Office: [phone] If no answer Cell [phone]

## 2018-11-19 ENCOUNTER — Other Ambulatory Visit: Payer: Self-pay | Admitting: Cardiology

## 2018-11-19 DIAGNOSIS — I5022 Chronic systolic (congestive) heart failure: Secondary | ICD-10-CM

## 2018-11-30 ENCOUNTER — Ambulatory Visit (INDEPENDENT_AMBULATORY_CARE_PROVIDER_SITE_OTHER): Payer: Medicare Other

## 2018-11-30 ENCOUNTER — Other Ambulatory Visit: Payer: Self-pay

## 2018-11-30 DIAGNOSIS — I428 Other cardiomyopathies: Secondary | ICD-10-CM

## 2018-11-30 DIAGNOSIS — I5022 Chronic systolic (congestive) heart failure: Secondary | ICD-10-CM | POA: Diagnosis not present

## 2018-12-08 ENCOUNTER — Other Ambulatory Visit: Payer: Self-pay

## 2018-12-08 ENCOUNTER — Ambulatory Visit
Admission: RE | Admit: 2018-12-08 | Discharge: 2018-12-08 | Disposition: A | Discharge status: Home / Self Care | Payer: Medicare Other | Source: Ambulatory Visit | Attending: Internal Medicine | Admitting: Internal Medicine

## 2018-12-08 DIAGNOSIS — Z1231 Encounter for screening mammogram for malignant neoplasm of breast: Secondary | ICD-10-CM

## 2018-12-10 ENCOUNTER — Other Ambulatory Visit: Payer: Self-pay | Admitting: Cardiology

## 2018-12-10 DIAGNOSIS — R0602 Shortness of breath: Secondary | ICD-10-CM

## 2018-12-10 DIAGNOSIS — I5022 Chronic systolic (congestive) heart failure: Secondary | ICD-10-CM

## 2018-12-10 NOTE — Progress Notes (Signed)
Home oxygen orders placed.

## 2018-12-15 ENCOUNTER — Other Ambulatory Visit: Payer: Self-pay | Admitting: Cardiology

## 2018-12-15 DIAGNOSIS — I5022 Chronic systolic (congestive) heart failure: Secondary | ICD-10-CM

## 2018-12-23 ENCOUNTER — Ambulatory Visit (INDEPENDENT_AMBULATORY_CARE_PROVIDER_SITE_OTHER): Payer: Medicare Other | Admitting: Cardiology

## 2018-12-23 ENCOUNTER — Encounter: Payer: Self-pay | Admitting: Cardiology

## 2018-12-23 ENCOUNTER — Other Ambulatory Visit: Payer: Self-pay

## 2018-12-23 VITALS — BP 140/78 | HR 64 | Temp 98.2°F | Ht 60.0 in | Wt 144.9 lb

## 2018-12-23 DIAGNOSIS — R0602 Shortness of breath: Secondary | ICD-10-CM

## 2018-12-23 DIAGNOSIS — G4734 Idiopathic sleep related nonobstructive alveolar hypoventilation: Secondary | ICD-10-CM

## 2018-12-23 DIAGNOSIS — I5022 Chronic systolic (congestive) heart failure: Secondary | ICD-10-CM | POA: Diagnosis not present

## 2018-12-23 DIAGNOSIS — I428 Other cardiomyopathies: Secondary | ICD-10-CM

## 2018-12-23 NOTE — Progress Notes (Signed)
Primary Physician/Referring:  Jani Gravel, MD  Patient ID: Pamela Dunn, female    DOB: [birth year], 85 y.o.   MRN: [id number]  Chief Complaint  Patient presents with  . Congestive Heart Failure  . Fatigue  . Follow-up    6 week  . Results    echo    HPI: Pamela Dunn  is a 85 y.o. female  with with long-standing history of recurrent rheumatoid arthritis, new onset left bundle branch block in the past 6-8 months.  Except for arthritis and her physical disability due to arthritis and using a cane to walk, also has pain in her hand joints.  Due to findings of new LBBB on routine physical exam compared to 2014, underwent echocardiogram in June 2020 revealed LVEF 20% and nuclear stress revealing dilated LV and minimal apical ischemia. Has chronic dyspnea on exertion.  Denies PND or orthopnea or leg edema. She now presents for a 6-week follow-up, underwent echocardiogram and also nocturnal oximetry in September 2020 and she is now on nocturnal oxygen supplementation.  She could not tolerate carvedilol due to diarrhea, she is presently on metoprolol succinate and BiDil which she is tolerating.  Could not tolerate higher doses of BiDil due to dizziness.  She now presents here for 6 week visit, states that since being on oxygen energy level has improved and dyspnea is improved.  She is also tolerating BiDil t.i.d. dosing without dizziness.  Past Medical History:  Diagnosis Date  . Allergy    seasonal  . Anemia   . Arthritis   . GERD (gastroesophageal reflux disease)   . Hyperlipidemia   . Hypertension     Past Surgical History:  Procedure Laterality Date  . APPENDECTOMY    . BACK SURGERY      Social History   Socioeconomic History  . Marital status: Widowed    Spouse name: Not on file  . Number of children: 2  . Years of education: Not on file  . Highest education level: Not on file  Occupational History  . Not on file  Social Needs  . Financial resource strain: Not on file   . Food insecurity    Worry: Not on file    Inability: Not on file  . Transportation needs    Medical: Not on file    Non-medical: Not on file  Tobacco Use  . Smoking status: Never Smoker  . Smokeless tobacco: Never Used  Substance and Sexual Activity  . Alcohol use: No  . Drug use: No  . Sexual activity: Not on file  Lifestyle  . Physical activity    Days per week: Not on file    Minutes per session: Not on file  . Stress: Not on file  Relationships  . Social Herbalist on phone: Not on file    Gets together: Not on file    Attends religious service: Not on file    Active member of club or organization: Not on file    Attends meetings of clubs or organizations: Not on file    Relationship status: Not on file  . Intimate partner violence    Fear of current or ex partner: Not on file    Emotionally abused: Not on file    Physically abused: Not on file    Forced sexual activity: Not on file  Other Topics Concern  . Not on file  Social History Narrative  . Not on file   Review of Systems  Constitution: Positive for malaise/fatigue (improved on o2 suppliemntation). Negative for chills, decreased appetite and weight gain.  Cardiovascular: Positive for dyspnea on exertion. Negative for leg swelling and syncope.  Endocrine: Negative for cold intolerance.  Hematologic/Lymphatic: Does not bruise/bleed easily.  Musculoskeletal: Positive for arthritis, joint pain and joint swelling.  Gastrointestinal: Negative for abdominal pain, anorexia, change in bowel habit, hematochezia and melena.  Neurological: Negative for headaches and light-headedness.  Psychiatric/Behavioral: Negative for depression and substance abuse.  All other systems reviewed and are negative.  Objective      Vitals with BMI 12/23/2018 11/11/2018 11/11/2018  Height 5' 0"  - 5' 4"   Weight 144 lbs 14 oz - 140 lbs  BMI 23.9 - 53.20  Systolic 233 435 686  Diastolic 78 61 70  Pulse 64 60 54    Physical  Exam  Constitutional: She appears well-developed. No distress.  Mildly obese  HENT:  Head: Atraumatic.  Eyes: Conjunctivae are normal.  Neck: Neck supple. No JVD present. No thyromegaly present.  Cardiovascular: Normal rate, regular rhythm and intact distal pulses. Exam reveals no gallop.  No murmur heard. S1 is normal, S2 is paradoxically split.  Pulmonary/Chest: Effort normal and breath sounds normal.  Abdominal: Soft. Bowel sounds are normal.  Musculoskeletal: Normal range of motion.        General: Deformity (burnt out RA in hands) present.  Neurological: She is alert.  Skin: Skin is warm and dry.  Psychiatric: She has a normal mood and affect.   Radiology: No results found.  Laboratory examination:  Labs 10/25/2018: Serum glucose 76, BUN 27, creatinine 1.08, eGFR 47 mL, potassium 4.6, HB 11.8, HCT 36.5, platelets 288.  Normal indicis.  Total cholesterol 205, triglycerides 224, HDL 49, LDL 111. TSH minimally elevated at 5.990.  PCP 04/26/2018:  Glucose 81, BUN/Cr 21/1.09, eGFR 47, Na/K 144/4.8, AST 21, ALT 8. Alk Phos 50. Rest of CMP normal.  WBC 13.3, RBC 3.71, H/H 11.3/35.3, MCV 96, Platelets 347,  Rest of CBC normal. Total cholesterol 202 triglycerides 86, HDL 57, LDL 128.  Lipid Panel  No results found for: CHOL, TRIG, HDL, CHOLHDL, VLDL, LDLCALC, LDLDIRECT HEMOGLOBIN A1C No results found for: HGBA1C, MPG TSH No results for input(s): TSH in the last 8760 hours.  Medications   Medications Discontinued During This Encounter  Medication Reason  . Multiple Vitamins-Minerals (PRESERVISION AREDS 2 PO) Error   Current Meds  Medication Sig  . Aspirin-Caffeine (BAYER BACK & BODY PAIN EX ST) 500-32.5 MG TABS Take by mouth 2 (two) times a day.   [name] Kitchen BIDIL 20-37.5 MG tablet TAKE 1 TABLET BY MOUTH THREE TIMES A DAY  . [place]well[place]-Glucosamine-Vit D (OSTEO BI-FLEX ONE PER DAY PO) Take 1 tablet by mouth daily.  . cholecalciferol (VITAMIN D) 1000 UNITS tablet Take 1,000 Units by mouth  daily.   . Coenzyme Q10 (COQ10) 200 MG CAPS Take 200 mg by mouth daily.  . folic acid (FOLVITE) 1 MG tablet Take 1 mg by mouth 2 (two) times daily.  [name] Kitchen levothyroxine (SYNTHROID) 25 MCG tablet Take 25 mcg by mouth daily.   [name] Kitchen levothyroxine (SYNTHROID) 50 MCG tablet Take 50 mcg by mouth daily before breakfast.  . metoprolol succinate (TOPROL-XL) 50 MG 24 hr tablet TAKE 1 TABLET (50 MG TOTAL) BY MOUTH DAILY. TAKE WITH OR IMMEDIATELY FOLLOWING A MEAL.  [name] Kitchen Omega-3 Fatty Acids (FISH OIL PO) Take by mouth daily.  . predniSONE (DELTASONE) 5 MG tablet Take 5 mg by mouth daily.  . [DISCONTINUED] Multiple Vitamins-Minerals (PRESERVISION AREDS  2 PO) Take 1 tablet by mouth daily.    Cardiac Studies:   CXR PA/LAT 07/15/17: Cardiac shadow is within normal limits. Mild aortic calcifications are seen. A partially calcified nodule is faintly visualized in the left upper lobe adjacent to the fissure. Some minimal scarring is noted in the right lung base and left mid lung. No focal confluent infiltrate or effusion is seen. No bony abnormality is noted. The changes seen in the right lower lobe previously are less well visualized. No bony abnormality is noted.  IMPRESSION: Some improvement when compare with the prior CT examination. Follow-up chest CT is again recommended in July of 2019 to assess for resolution/stability.  CT Chest with contrast 06/11/2017: 1. Patchy nodular perilobular consolidation, ground-glass opacity and tree-in-bud opacities throughout both lungs, most prominent in the right lower lobe. A resolving multilobar bronchopneumonia is favored. Cryptogenic organizing pneumonia and neoplasm are considered less likely. Recommend follow-up chest CT in 3 months given the nodular character of the lung opacities. 2. Small hiatal hernia. 3. Two-vessel coronary atherosclerosis.  Lower Extremity Venous Duplex Bilateral at [facility] 07/14/2012: No evidence of thrombus or thrombophlebitis.  Lexiscan Myoview  Stress Test 09/06/2018: Resting EKG demonstrates normal sinus rhythm.  Left bundle branch block.  Stress EKG is non-diagnostic as it's a pharmacologic stress test. Nuclear images reveal left ventricle to be mildly dilated both in rest and stress images with end-diastolic volume of 967 mL.  The perfusion imaging study demonstrates a moderate sized inferior and inferolateral decreased uptake suggestive of soft tissue attenuation.  In addition there is a very small sized apical septal defect suggestive of ischemia. The left ventricle systolic function calculated by QGS was 24% with global hypokinesis. This represents a high risk study in view of low EF, clinical correlation recommended. Findings may represent non ischemic dilated cardiomyopathy.  Nocturnal oximetry 12/02/2018: SPO2 <88% 23 minutes, <89% 37 minutes, highest SPO2 99%, lowest SPO2 69%.  Oxygen desaturation events 3% (316 events).  Patient prescribed nocturnal oxygen supplementation.  Echocardiogram 11/30/2018: Left ventricle cavity is normal in size. Moderate concentric hypertrophy of the left ventricle. Moderate global and severe anteroseptal akinesis.  Moderately depressed LV systolic function with EF  around 40%. Grade 1 diastolic function with normal LAP.  Left atrial cavity is normal in size. Aneurysmal interatrial septum without 2D or color Doppler evidence of interatrial shunt. Mild to moderate mitral regurgitation. Mild to moderate tricuspid regurgitation. Estimated pulmonary artery systolic pressure is 29 mmHg.  Compared to previous study on 08/11/2018, LVEF is improved from 20%. Mitral regurgitation is less severe.   Assessment     ICD-10-CM   1. Chronic systolic heart failure (HCC)  I50.22 EKG 12-Lead    PCV ECHOCARDIOGRAM COMPLETE  2. Shortness of breath  R06.02 PCV ECHOCARDIOGRAM COMPLETE  3. Non-ischemic cardiomyopathy ([place])  I42.8   4. Nocturnal hypoxemia  G47.34    12/02/18: Now on O2 suppliment   EKG 12/23/2018: Normal  sinus rhythm with rate of 59 bpm, left atrial enlargement, LBBB.  No further analysis. Compared to EKG 05/21/2018: Normal sinus rhythm at rate of 79 bpm.  Recommendations:   Patient with nonischemic nuclear stress test and echocardiogram revealing severe LV systolic dysfunction in June 2020 presenting with left bundle branch block, now being treated for nonischemic cardiomyopathy.  Unable to tolerate potassium sparing drugs including Ace inhibitors and ARB's due to hyperkalemia. I reviewed her latest labs from the PCP, she doesn't stage III chronic kidney disease, potassium levels are normal. She is now on metoprolol  succinate 50 mg daily and BiDil, one p.o. t.i.d.  Patient is now here on a 6 week office visit, I reviewed the echocardiogram, EF is improved from 20% to the patient and 40%.  We'll repeat echocardiogram in 6 months.  She is on maximum tolerated doses of the medication.  Blood pressure although systolic slightly high, home blood pressure monitoring reveals systolic blood pressure to be around 120-127 mmHg.  Hence I did not make any changes to her medications.  Since being on nocturnal oxygen supplementation, states that her energy level is improved and dyspnea is improved.  Continue the same.  Adrian Prows, MD, [facility] 12/23/2018, 1:12 PM [facility]. [place] Pager: [phone] Office: [phone] If no answer Cell [phone]

## 2019-01-12 ENCOUNTER — Telehealth: Payer: Self-pay

## 2019-01-12 NOTE — Telephone Encounter (Signed)
Pt daughter called says she is feeling faint and her bp is low 45/98 daughter is on her way to her house to check on her; I explained use her discretion to see if she may need to go top ED but FYI she will call back to keep [place] updated

## 2019-03-09 ENCOUNTER — Other Ambulatory Visit: Payer: Self-pay | Admitting: Cardiology

## 2019-03-09 DIAGNOSIS — I5022 Chronic systolic (congestive) heart failure: Secondary | ICD-10-CM

## 2019-04-19 ENCOUNTER — Encounter: Payer: Self-pay | Admitting: Cardiology

## 2019-04-23 ENCOUNTER — Other Ambulatory Visit: Payer: Self-pay | Admitting: Cardiology

## 2019-04-23 DIAGNOSIS — I5022 Chronic systolic (congestive) heart failure: Secondary | ICD-10-CM

## 2019-06-11 ENCOUNTER — Other Ambulatory Visit: Payer: Self-pay | Admitting: Cardiology

## 2019-06-11 DIAGNOSIS — I5022 Chronic systolic (congestive) heart failure: Secondary | ICD-10-CM

## 2019-06-16 ENCOUNTER — Ambulatory Visit: Payer: Medicare Other

## 2019-06-16 ENCOUNTER — Other Ambulatory Visit: Payer: Self-pay

## 2019-06-16 DIAGNOSIS — R0602 Shortness of breath: Secondary | ICD-10-CM

## 2019-06-16 DIAGNOSIS — I5022 Chronic systolic (congestive) heart failure: Secondary | ICD-10-CM

## 2019-06-23 ENCOUNTER — Other Ambulatory Visit: Payer: Self-pay

## 2019-06-23 ENCOUNTER — Encounter: Payer: Self-pay | Admitting: Cardiology

## 2019-06-23 ENCOUNTER — Ambulatory Visit: Payer: Medicare Other | Admitting: Cardiology

## 2019-06-23 VITALS — BP 181/80 | HR 69 | Temp 97.3°F | Ht 61.0 in | Wt 144.0 lb

## 2019-06-23 DIAGNOSIS — R0602 Shortness of breath: Secondary | ICD-10-CM

## 2019-06-23 DIAGNOSIS — I5022 Chronic systolic (congestive) heart failure: Secondary | ICD-10-CM

## 2019-06-23 DIAGNOSIS — I428 Other cardiomyopathies: Secondary | ICD-10-CM

## 2019-06-23 DIAGNOSIS — G4734 Idiopathic sleep related nonobstructive alveolar hypoventilation: Secondary | ICD-10-CM

## 2019-06-23 NOTE — Progress Notes (Signed)
Primary Physician/Referring:  Jani Gravel, MD  Patient ID: Pamela Dunn, female    DOB: 15-Sep-1931, 86 y.o.   MRN: [id number]  Chief Complaint  Patient presents with  . Congestive Heart Failure  . Follow-up    Echo     HPI: Pamela Dunn  is a 86 y.o. female  with with long-standing history of recurrent rheumatoid arthritis, left bundle branch block, nonischemic cardiomyopathy with negative nuclear stress test and EF 20% in July 2020, hypertension, hyperlipidemia, severe DJD and RA with arthritis and her physical disability due to arthritis and using a cane to walk, also has pain in her hand joints. Nocturnal oximetry in September 2020 and she is now on nocturnal oxygen supplementation.    This is her 57-monthoffice visit.  She has not had any recent hospitalization.  States that she is doing well and has not had any dizziness or syncope or chest pain.  Dyspnea has remained stable. [facility], difficult airway and edema.   Past Medical History:  Diagnosis Date  . Allergy    seasonal  . Anemia   . Arthritis   . GERD (gastroesophageal reflux disease)   . Hyperlipidemia   . Hypertension     Past Surgical History:  Procedure Laterality Date  . APPENDECTOMY    . BACK SURGERY     Social History   Tobacco Use  . Smoking status: Never Smoker  . Smokeless tobacco: Never Used  Substance Use Topics  . Alcohol use: No   Marital Status: Widowed   Review of Systems  Constitution: Positive for malaise/fatigue (improved on O2 supplement).  Cardiovascular: Positive for dyspnea on exertion (stable). Negative for chest pain and leg swelling.  Musculoskeletal: Positive for arthritis and joint pain.  Gastrointestinal: Negative for melena.   Objective      Vitals with BMI 12/23/2018 11/11/2018 11/11/2018  Height 5' 0"  - 5' 4"   Weight 144 lbs 14 oz - 140 lbs  BMI 224.0- 297.35 Systolic [id number] Diastolic  78 61 70  Pulse 64 60 54    Physical Exam  Constitutional: She appears well-developed. No distress.  Mildly obese  Cardiovascular: Normal rate, regular rhythm and intact distal pulses. Exam reveals no gallop.  No murmur heard. S1 is normal, S2 is paradoxically split. No JVD. No pedal edema.  Pulmonary/Chest: Effort normal and breath sounds normal.  Abdominal: Soft. Bowel sounds are normal.  Musculoskeletal:        General: Deformity (burnt out RA in hands) present. Normal range of motion.   Radiology: No results found.  Laboratory examination:  Labs 10/25/2018: Serum glucose 76, BUN 27, creatinine 1.08, eGFR 47 mL, potassium 4.6, HB 11.8, HCT 36.5, platelets 288.  Normal indicis.  Total cholesterol 205, triglycerides 224, HDL 49, LDL 111. TSH minimally elevated at 5.990.  PCP 04/26/2018:  Glucose 81, BUN/Cr 21/1.09, eGFR 47, Na/K 144/4.8, AST 21, ALT 8. Alk Phos 50. Rest of CMP normal.  WBC 13.3, RBC 3.71, H/H 11.3/35.3, MCV 96, Platelets 347,  Rest of CBC normal. Total cholesterol 202 triglycerides 86, HDL 57, LDL 128.  Lipid Panel  No results found for: CHOL, TRIG, HDL, CHOLHDL, VLDL, LDLCALC, LDLDIRECT HEMOGLOBIN A1C No results found for: HGBA1C, MPG TSH No results for input(s): TSH in the last 8760 hours.  Medications   Medications Discontinued During This Encounter  Medication Reason  . levothyroxine (SYNTHROID) 50 MCG  tablet Discontinued by provider   No outpatient medications have been marked as taking for the 06/23/19 encounter (Office Visit) with Adrian Prows, MD.    Cardiac Studies:   CXR PA/LAT 07/15/17: Cardiac shadow is within normal limits. Mild aortic calcifications are seen. A partially calcified nodule is faintly visualized in the left upper lobe adjacent to the fissure. Some minimal scarring is noted in the right lung base and left mid lung. No focal confluent infiltrate or effusion is seen. No bony abnormality is noted. The changes seen in the right lower  lobe previously are less well visualized. No bony abnormality is noted.  IMPRESSION: Some improvement when compare with the prior CT examination. Follow-up chest CT is again recommended in July of 2019 to assess for resolution/stability.  CT Chest with contrast 06/11/2017: 1. Patchy nodular perilobular consolidation, ground-glass opacity and tree-in-bud opacities throughout both lungs, most prominent in the right lower lobe. A resolving multilobar bronchopneumonia is favored. Cryptogenic organizing pneumonia and neoplasm are considered less likely. Recommend follow-up chest CT in 3 months given the nodular character of the lung opacities. 2. Small hiatal hernia. 3. Two-vessel coronary atherosclerosis.  Lower Extremity Venous Duplex B[facility] at [facility] 07/14/2012: No evidence of thrombus or thrombophlebitis.  Lexiscan Myoview Stress Test 09/06/2018: Resting EKG demonstrates normal sinus rhythm.  Left bundle branch block.  Stress EKG is non-diagnostic as it's a pharmacologic stress test. Nuclear images reveal left ventricle to be mildly dilated both in rest and stress images with end-diastolic volume of 409 mL.  The perfusion imaging study demonstrates a moderate sized inferior and inferolateral decreased uptake suggestive of soft tissue attenuation.  In addition there is a very small sized apical septal defect suggestive of ischemia. The left ventricle systolic function calculated by QGS was 24% with global hypokinesis. This represents a high risk study in view of low EF, clinical correlation recommended. Findings may represent non ischemic dilated cardiomyopathy.  Nocturnal oximetry 12/02/2018: SPO2 <88% 23 minutes, <89% 37 minutes, highest SPO2 99%, lowest SPO2 69%.  Oxygen desaturation events 3% (316 events).  Patient prescribed nocturnal oxygen supplementation.  Echocardiogram 06/16/2019:  Left ventricle cavity is normal in size. Mild concentric hypertrophy of  the left ventricle. Abnormal septal  wall motion due to left bundle branch  block. Mildly depressed LV systolic function with visual EF 40-45%.  Doppler evidence of grade I (impaired) diastolic dysfunction, normal LAP.  Trileaflet aortic valve. Trace aortic regurgitation.  Moderate (Grade II) mitral regurgitation.  Moderate tricuspid regurgitation. Estimated pulmonary artery systolic  pressure is 30 mmHg.  No significant change compared to previous study on 11/30/2018. Compared to previous study on 08/11/2018, LVEF is improved from 20%. Mitral regurgitation is less severe.   EKG 06/23/2019: Normal sinus rhythm with rate of 62 bpm, left atrial enlargement, left bundle branch block.  No further analysis.  No significant change from 12/23/2018.  Assessment     ICD-10-CM   1. Chronic systolic heart failure (HCC)  I50.22 EKG 12-Lead  2. Shortness of breath  R06.02   3. Non-ischemic cardiomyopathy ([place])  I42.8   4. Nocturnal hypoxemia  G47.34    Recommendations:   Pamela Dunn  is a 86 y.o. female  with with long-standing history of recurrent rheumatoid arthritis, left bundle branch block, nonischemic cardiomyopathy with negative nuclear stress test and EF 20% in July 2020, hypertension, hyperlipidemia, severe DJD and RA with arthritis and her physical disability due to arthritis and using a cane to walk, also has pain in her hand joints.  Nocturnal oximetry in September 2020 and she is now on nocturnal oxygen supplementation. Unable to tolerate potassium sparing drugs including Ace inhibitors and ARB's due to hyperkalemia.   Overall she is remained stable from cardiac standpoint, symptoms of fatigue is improved on oxygen supplementation, today there is no clinical evidence of heart failure, continue present medical therapy.  Reviewed her echocardiogram, ejection fraction is remained stable around 40 to 45%.  Weight loss again discussed.  She is 86 years of age but continues to thrive well.  I will see her back in 6 months for follow-up  of CHF.  I have flagged the chart for difficult airway ([facility] unable to pass scope 05/17/2019).  Adrian Prows, MD, [facility] 06/23/2019, 1:07 PM [facility] [place] Office: [phone]

## 2019-08-16 ENCOUNTER — Emergency Department (HOSPITAL_COMMUNITY): Payer: Medicare PPO

## 2019-08-16 ENCOUNTER — Other Ambulatory Visit: Payer: Self-pay

## 2019-08-16 ENCOUNTER — Inpatient Hospital Stay (HOSPITAL_COMMUNITY)
Admission: EM | Admit: 2019-08-16 | Discharge: 2019-09-02 | DRG: 329 | Disposition: A | Discharge status: Skilled Nursing Facility | Payer: Medicare PPO | Attending: Internal Medicine | Admitting: Internal Medicine

## 2019-08-16 DIAGNOSIS — N289 Disorder of kidney and ureter, unspecified: Secondary | ICD-10-CM

## 2019-08-16 DIAGNOSIS — I5032 Chronic diastolic (congestive) heart failure: Secondary | ICD-10-CM

## 2019-08-16 DIAGNOSIS — I428 Other cardiomyopathies: Secondary | ICD-10-CM | POA: Diagnosis present

## 2019-08-16 DIAGNOSIS — Z8249 Family history of ischemic heart disease and other diseases of the circulatory system: Secondary | ICD-10-CM

## 2019-08-16 DIAGNOSIS — K567 Ileus, unspecified: Secondary | ICD-10-CM | POA: Diagnosis present

## 2019-08-16 DIAGNOSIS — K0530 Chronic periodontitis, unspecified: Secondary | ICD-10-CM | POA: Diagnosis present

## 2019-08-16 DIAGNOSIS — M272 Inflammatory conditions of jaws: Secondary | ICD-10-CM | POA: Diagnosis present

## 2019-08-16 DIAGNOSIS — R0902 Hypoxemia: Secondary | ICD-10-CM

## 2019-08-16 DIAGNOSIS — K047 Periapical abscess without sinus: Secondary | ICD-10-CM | POA: Diagnosis present

## 2019-08-16 DIAGNOSIS — E785 Hyperlipidemia, unspecified: Secondary | ICD-10-CM | POA: Diagnosis present

## 2019-08-16 DIAGNOSIS — K5651 Intestinal adhesions [bands], with partial obstruction: Principal | ICD-10-CM | POA: Diagnosis present

## 2019-08-16 DIAGNOSIS — Z888 Allergy status to other drugs, medicaments and biological substances status: Secondary | ICD-10-CM

## 2019-08-16 DIAGNOSIS — E162 Hypoglycemia, unspecified: Secondary | ICD-10-CM | POA: Diagnosis not present

## 2019-08-16 DIAGNOSIS — K56600 Partial intestinal obstruction, unspecified as to cause: Secondary | ICD-10-CM | POA: Diagnosis not present

## 2019-08-16 DIAGNOSIS — E039 Hypothyroidism, unspecified: Secondary | ICD-10-CM

## 2019-08-16 DIAGNOSIS — E876 Hypokalemia: Secondary | ICD-10-CM | POA: Diagnosis not present

## 2019-08-16 DIAGNOSIS — R9389 Abnormal findings on diagnostic imaging of other specified body structures: Secondary | ICD-10-CM

## 2019-08-16 DIAGNOSIS — Z6829 Body mass index (BMI) 29.0-29.9, adult: Secondary | ICD-10-CM

## 2019-08-16 DIAGNOSIS — Z5331 Laparoscopic surgical procedure converted to open procedure: Secondary | ICD-10-CM

## 2019-08-16 DIAGNOSIS — K117 Disturbances of salivary secretion: Secondary | ICD-10-CM | POA: Diagnosis present

## 2019-08-16 DIAGNOSIS — Z7982 Long term (current) use of aspirin: Secondary | ICD-10-CM

## 2019-08-16 DIAGNOSIS — Z7952 Long term (current) use of systemic steroids: Secondary | ICD-10-CM

## 2019-08-16 DIAGNOSIS — Z8711 Personal history of peptic ulcer disease: Secondary | ICD-10-CM

## 2019-08-16 DIAGNOSIS — R339 Retention of urine, unspecified: Secondary | ICD-10-CM | POA: Diagnosis present

## 2019-08-16 DIAGNOSIS — I5042 Chronic combined systolic (congestive) and diastolic (congestive) heart failure: Secondary | ICD-10-CM

## 2019-08-16 DIAGNOSIS — E669 Obesity, unspecified: Secondary | ICD-10-CM | POA: Diagnosis present

## 2019-08-16 DIAGNOSIS — K56609 Unspecified intestinal obstruction, unspecified as to partial versus complete obstruction: Secondary | ICD-10-CM | POA: Diagnosis present

## 2019-08-16 DIAGNOSIS — N179 Acute kidney failure, unspecified: Secondary | ICD-10-CM | POA: Diagnosis present

## 2019-08-16 DIAGNOSIS — R0989 Other specified symptoms and signs involving the circulatory and respiratory systems: Secondary | ICD-10-CM | POA: Diagnosis present

## 2019-08-16 DIAGNOSIS — D62 Acute posthemorrhagic anemia: Secondary | ICD-10-CM | POA: Diagnosis not present

## 2019-08-16 DIAGNOSIS — K219 Gastro-esophageal reflux disease without esophagitis: Secondary | ICD-10-CM | POA: Diagnosis present

## 2019-08-16 DIAGNOSIS — Z79890 Hormone replacement therapy (postmenopausal): Secondary | ICD-10-CM

## 2019-08-16 DIAGNOSIS — L8990 Pressure ulcer of unspecified site, unspecified stage: Secondary | ICD-10-CM | POA: Insufficient documentation

## 2019-08-16 DIAGNOSIS — K660 Peritoneal adhesions (postprocedural) (postinfection): Secondary | ICD-10-CM | POA: Diagnosis present

## 2019-08-16 DIAGNOSIS — Z20822 Contact with and (suspected) exposure to covid-19: Secondary | ICD-10-CM | POA: Diagnosis present

## 2019-08-16 DIAGNOSIS — R1114 Bilious vomiting: Secondary | ICD-10-CM | POA: Diagnosis not present

## 2019-08-16 DIAGNOSIS — Z9049 Acquired absence of other specified parts of digestive tract: Secondary | ICD-10-CM

## 2019-08-16 DIAGNOSIS — K631 Perforation of intestine (nontraumatic): Secondary | ICD-10-CM | POA: Diagnosis present

## 2019-08-16 DIAGNOSIS — R109 Unspecified abdominal pain: Secondary | ICD-10-CM | POA: Diagnosis not present

## 2019-08-16 DIAGNOSIS — I2510 Atherosclerotic heart disease of native coronary artery without angina pectoris: Secondary | ICD-10-CM | POA: Diagnosis present

## 2019-08-16 DIAGNOSIS — E875 Hyperkalemia: Secondary | ICD-10-CM | POA: Diagnosis present

## 2019-08-16 DIAGNOSIS — Z803 Family history of malignant neoplasm of breast: Secondary | ICD-10-CM

## 2019-08-16 DIAGNOSIS — M069 Rheumatoid arthritis, unspecified: Secondary | ICD-10-CM | POA: Diagnosis present

## 2019-08-16 DIAGNOSIS — I959 Hypotension, unspecified: Secondary | ICD-10-CM | POA: Diagnosis present

## 2019-08-16 DIAGNOSIS — Z79899 Other long term (current) drug therapy: Secondary | ICD-10-CM

## 2019-08-16 DIAGNOSIS — Z0189 Encounter for other specified special examinations: Secondary | ICD-10-CM

## 2019-08-16 DIAGNOSIS — I110 Hypertensive heart disease with heart failure: Secondary | ICD-10-CM | POA: Diagnosis present

## 2019-08-16 LAB — CBC WITH DIFFERENTIAL/PLATELET
Abs Immature Granulocytes: 0.3 10*3/uL — ABNORMAL HIGH (ref 0.00–0.07)
Basophils Absolute: 0.1 10*3/uL (ref 0.0–0.1)
Basophils Relative: 1 %
Eosinophils Absolute: 0.2 10*3/uL (ref 0.0–0.5)
Eosinophils Relative: 1 %
HCT: 40.6 % (ref 36.0–46.0)
Hemoglobin: 12.9 g/dL (ref 12.0–15.0)
Immature Granulocytes: 2 %
Lymphocytes Relative: 16 %
Lymphs Abs: 2.3 10*3/uL (ref 0.7–4.0)
MCH: 31.5 pg (ref 26.0–34.0)
MCHC: 31.8 g/dL (ref 30.0–36.0)
MCV: 99.3 fL (ref 80.0–100.0)
Monocytes Absolute: 1 10*3/uL (ref 0.1–1.0)
Monocytes Relative: 7 %
Neutro Abs: 10.7 10*3/uL — ABNORMAL HIGH (ref 1.7–7.7)
Neutrophils Relative %: 73 %
Platelets: 282 10*3/uL (ref 150–400)
RBC: 4.09 MIL/uL (ref 3.87–5.11)
RDW: 15.2 % (ref 11.5–15.5)
WBC: 14.5 10*3/uL — ABNORMAL HIGH (ref 4.0–10.5)
nRBC: 0 % (ref 0.0–0.2)

## 2019-08-16 LAB — CBG MONITORING, ED: Glucose-Capillary: 104 mg/dL — ABNORMAL HIGH (ref 70–99)

## 2019-08-16 MED ORDER — SODIUM CHLORIDE 0.9 % IV BOLUS
500.0000 mL | Freq: Once | INTRAVENOUS | Status: AC
  Administered 2019-08-16: 500 mL via INTRAVENOUS

## 2019-08-16 MED ORDER — ONDANSETRON HCL 4 MG/2ML IJ SOLN
4.0000 mg | Freq: Once | INTRAMUSCULAR | Status: AC
  Administered 2019-08-16: 4 mg via INTRAVENOUS
  Filled 2019-08-16: qty 2

## 2019-08-16 MED ORDER — FENTANYL CITRATE (PF) 100 MCG/2ML IJ SOLN
50.0000 ug | Freq: Once | INTRAMUSCULAR | Status: AC
  Administered 2019-08-16: 50 ug via INTRAVENOUS
  Filled 2019-08-16: qty 2

## 2019-08-16 NOTE — ED Triage Notes (Signed)
PER EMS: Patient is coming from home with c/o abdominal pain with nausea and vomiting. States she ate a hamburger at 5:00pm and has been vomiting since 7:00pm. Generalized lower abdominal pain, 9/10. A&Ox4.   EMS VITALS: BP 161/80 HR 60 RR 18 SPO2 97% RA CBG 126

## 2019-08-16 NOTE — ED Provider Notes (Signed)
[facility]-EMERGENCY DEPT Provider Note   CSN: [id number] Arrival date & time: 08/16/19  2303     History No chief complaint on file.   Pamela Dunn is a 86 y.o. female.  The history is provided by the patient and medical records. No language interpreter was used.   Pamela Dunn is a 86 y.o. female who presents to the Emergency Department complaining of abdominal pain, vomiting.  She presents to the ED by EMS complaining of severe abdominal pain that began at 830pm today.  Pain began abruptly and is described as a terrible pain across her entire abdomen.  She has associated numerous episodes of nonbloody emesis.  No diarrhea, chest pain, fevers, dysuria.  Pt lives alone.  She ate a happy meal from [employer] about five pm and thinks she may have gotten sick from it.      Past Medical History:  Diagnosis Date  . Allergy    seasonal  . Anemia   . Arthritis   . GERD (gastroesophageal reflux disease)   . Hyperlipidemia   . Hypertension     Patient Active Problem List   Diagnosis Date Noted  . Multiple pulmonary nodules 06/19/2017  . Metatarsalgia of left foot 07/13/2014  . Hx of adenomatous colonic polyps 09/05/2013  . Spinal stenosis of lumbar region 09/05/2013  . Lumbar disc disease 09/05/2013  . HYPERLIPIDEMIA 10/23/2009  . GERD 10/23/2009  . ARTHRITIS, RHEUMATOID 10/23/2009  . ABDOMINAL PAIN, UNSPECIFIED SITE 10/23/2009  . ANEMIA, HX OF 10/23/2009  . PEPTIC ULCER DISEASE, HX OF 10/23/2009    Past Surgical History:  Procedure Laterality Date  . APPENDECTOMY    . BACK SURGERY       OB History   No obstetric history on file.     Family History  Problem Relation Age of Onset  . Breast cancer Maternal Aunt   . Cancer Mother   . Cancer - Colon Sister   . Heart disease Sister   . Cancer - Colon Brother   . Cancer - Colon Brother     Social History   Tobacco Use  . Smoking status: Never Smoker  . Smokeless tobacco: Never Used    Substance Use Topics  . Alcohol use: No  . Drug use: No    Home Medications Prior to Admission medications   Medication Sig Start Date End Date Taking? Authorizing Provider  Aspirin-Caffeine (BAYER BACK & BODY PAIN EX ST) 500-32.5 MG TABS Take by mouth 2 (two) times a day.     [provider]  BIDIL 20-37.5 MG tablet TAKE 1 TABLET BY MOUTH THREE TIMES A DAY 06/13/19   Yates Decamp, MD  Boswellia-Glucosamine-Vit D (OSTEO BI-FLEX ONE PER DAY PO) Take 1 tablet by mouth daily.    [provider]  cholecalciferol (VITAMIN D) 1000 UNITS tablet Take 1,000 Units by mouth daily.     [provider]  Coenzyme Q10 (COQ10) 200 MG CAPS Take 200 mg by mouth daily.    [provider]  ezetimibe (ZETIA) 10 MG tablet Take 5 mg by mouth daily.    [provider]  folic acid (FOLVITE) 1 MG tablet Take 1 mg by mouth daily.  05/03/18   [provider]  levothyroxine (SYNTHROID) 25 MCG tablet Take 25 mcg by mouth daily.     [provider]  metoprolol succinate (TOPROL-XL) 50 MG 24 hr tablet TAKE 1 TABLET (50 MG TOTAL) BY MOUTH DAILY. TAKE WITH OR IMMEDIATELY FOLLOWING A  MEAL. 04/26/19 07/25/19  Adrian Prows, MD  moxifloxacin (VIGAMOX) 0.5 % ophthalmic solution  05/30/19   [provider]  Multiple Vitamins-Minerals (ICAPS AREDS 2 PO) Take 1 capsule by mouth daily.    [provider]  predniSONE (DELTASONE) 5 MG tablet Take 5 mg by mouth daily. 11/03/18   [provider]  trimethoprim-polymyxin b (POLYTRIM) ophthalmic solution  04/13/19   [provider]    Allergies    Coreg [carvedilol]  Review of Systems   Review of Systems  All other systems reviewed and are negative.   Physical Exam Updated Vital Signs BP (!) 124/48   Pulse (!) 54   Temp 98.3 F (36.8 C) (Oral)   Resp 12   SpO2 99%   Physical Exam Vitals and nursing note reviewed.  Constitutional:      General: She is in acute distress.     Appearance:  She is well-developed. She is diaphoretic.  HENT:     Head: Normocephalic and atraumatic.  Cardiovascular:     Rate and Rhythm: Normal rate and regular rhythm.     Heart sounds: No murmur.  Pulmonary:     Effort: Pulmonary effort is normal. No respiratory distress.     Breath sounds: Normal breath sounds.  Abdominal:     Palpations: Abdomen is soft.     Tenderness: There is no guarding or rebound.     Comments: Mild generalized abdominal tenderness  Musculoskeletal:        General: No tenderness.     Comments: Chronic deformity to RUE  Skin:    General: Skin is warm.  Neurological:     Mental Status: She is alert and oriented to person, place, and time.  Psychiatric:        Behavior: Behavior normal.     ED Results / Procedures / Treatments   Labs (all labs ordered are listed, but only abnormal results are displayed) Labs Reviewed  COMPREHENSIVE METABOLIC PANEL - Abnormal; Notable for the following components:      Result Value   Glucose, Bld 110 (*)    BUN 33 (*)    Creatinine, Ser 1.24 (*)    Calcium 8.7 (*)    Total Protein 6.1 (*)    Albumin 3.4 (*)    Alkaline Phosphatase 37 (*)    GFR calc non Af Amer 40 (*)    GFR calc Af Amer 46 (*)    All other components within normal limits  CBC WITH DIFFERENTIAL/PLATELET - Abnormal; Notable for the following components:   WBC 14.5 (*)    Neutro Abs 10.7 (*)    Abs Immature Granulocytes 0.30 (*)    All other components within normal limits  CBG MONITORING, ED - Abnormal; Notable for the following components:   Glucose-Capillary 104 (*)    All other components within normal limits  LIPASE, BLOOD  URINALYSIS, ROUTINE W REFLEX MICROSCOPIC    EKG EKG Interpretation  Date/Time:  Tuesday August 16 2019 23:47:47 EDT Ventricular Rate:  57 PR Interval:    QRS Duration: 138 QT Interval:  447 QTC Calculation: 436 R Axis:   -31 Text Interpretation: Sinus rhythm Left bundle branch block Confirmed by Quintella Reichert [phone])  on 08/17/2019 12:04:13 AM   Radiology DG Chest Port 1 View  Result Date: 08/17/2019 CLINICAL DATA:  Abdominal pain EXAM: PORTABLE CHEST 1 VIEW COMPARISON:  None. FINDINGS: The heart size and mediastinal contours are within normal limits. Both lungs are clear. The visualized skeletal structures are  unremarkable. IMPRESSION: No active disease. Electronically Signed   By: Deatra Robinson M.D.   On: 08/17/2019 00:08    Procedures Procedures (including critical care time)  Medications Ordered in ED Medications  sodium chloride (PF) 0.9 % injection (has no administration in time range)  sodium chloride 0.9 % bolus 500 mL (0 mLs Intravenous Stopped 08/17/19 0028)  ondansetron (ZOFRAN) injection 4 mg (4 mg Intravenous Given 08/16/19 2343)  fentaNYL (SUBLIMAZE) injection 50 mcg (50 mcg Intravenous Given 08/16/19 2343)  fentaNYL (SUBLIMAZE) injection 50 mcg (50 mcg Intravenous Given 08/17/19 0039)  pantoprazole (PROTONIX) injection 40 mg (40 mg Intravenous Given 08/17/19 0115)  iohexol (OMNIPAQUE) 300 MG/ML solution 100 mL (80 mLs Intravenous Contrast Given 08/17/19 0129)    ED Course  I have reviewed the triage vital signs and the nursing notes.  Pertinent labs & imaging results that were available during my care of the patient were reviewed by me and considered in my medical decision making (see chart for details).    MDM Rules/Calculators/A&P                     Pt with hx/o RA on chronic prednisone here for evaluation of abdominal pain and vomiting.  She is ill appearing on exam with generalized abdominal tenderness, diaphoresis.  She was treated with pain medications, IV fluids and antiemetics.  Labs c/w dehydration with elevation in BUN.  Plan to obtain CT abdomen to further evaluate sxs.  Pt care transferred pending CT.  Final Clinical Impression(s) / ED Diagnoses Final diagnoses:  None    Rx / DC Orders ED Discharge Orders    None       Tilden Fossa, MD 08/17/19 [phone]

## 2019-08-17 ENCOUNTER — Encounter (HOSPITAL_COMMUNITY): Payer: Self-pay

## 2019-08-17 ENCOUNTER — Inpatient Hospital Stay (HOSPITAL_COMMUNITY): Payer: Medicare PPO

## 2019-08-17 ENCOUNTER — Emergency Department (HOSPITAL_COMMUNITY): Payer: Medicare PPO

## 2019-08-17 DIAGNOSIS — I428 Other cardiomyopathies: Secondary | ICD-10-CM | POA: Diagnosis present

## 2019-08-17 DIAGNOSIS — M069 Rheumatoid arthritis, unspecified: Secondary | ICD-10-CM | POA: Diagnosis present

## 2019-08-17 DIAGNOSIS — Z888 Allergy status to other drugs, medicaments and biological substances status: Secondary | ICD-10-CM | POA: Diagnosis not present

## 2019-08-17 DIAGNOSIS — K5651 Intestinal adhesions [bands], with partial obstruction: Secondary | ICD-10-CM | POA: Diagnosis present

## 2019-08-17 DIAGNOSIS — I5042 Chronic combined systolic (congestive) and diastolic (congestive) heart failure: Secondary | ICD-10-CM | POA: Diagnosis present

## 2019-08-17 DIAGNOSIS — Z9049 Acquired absence of other specified parts of digestive tract: Secondary | ICD-10-CM | POA: Diagnosis not present

## 2019-08-17 DIAGNOSIS — Z8249 Family history of ischemic heart disease and other diseases of the circulatory system: Secondary | ICD-10-CM | POA: Diagnosis not present

## 2019-08-17 DIAGNOSIS — E669 Obesity, unspecified: Secondary | ICD-10-CM | POA: Diagnosis present

## 2019-08-17 DIAGNOSIS — Z803 Family history of malignant neoplasm of breast: Secondary | ICD-10-CM | POA: Diagnosis not present

## 2019-08-17 DIAGNOSIS — R0989 Other specified symptoms and signs involving the circulatory and respiratory systems: Secondary | ICD-10-CM | POA: Diagnosis present

## 2019-08-17 DIAGNOSIS — I959 Hypotension, unspecified: Secondary | ICD-10-CM | POA: Diagnosis present

## 2019-08-17 DIAGNOSIS — Z79899 Other long term (current) drug therapy: Secondary | ICD-10-CM | POA: Diagnosis not present

## 2019-08-17 DIAGNOSIS — D62 Acute posthemorrhagic anemia: Secondary | ICD-10-CM | POA: Diagnosis not present

## 2019-08-17 DIAGNOSIS — K219 Gastro-esophageal reflux disease without esophagitis: Secondary | ICD-10-CM | POA: Diagnosis present

## 2019-08-17 DIAGNOSIS — Z79890 Hormone replacement therapy (postmenopausal): Secondary | ICD-10-CM | POA: Diagnosis not present

## 2019-08-17 DIAGNOSIS — E875 Hyperkalemia: Secondary | ICD-10-CM | POA: Diagnosis present

## 2019-08-17 DIAGNOSIS — J9811 Atelectasis: Secondary | ICD-10-CM | POA: Diagnosis present

## 2019-08-17 DIAGNOSIS — E039 Hypothyroidism, unspecified: Secondary | ICD-10-CM | POA: Diagnosis present

## 2019-08-17 DIAGNOSIS — K56600 Partial intestinal obstruction, unspecified as to cause: Secondary | ICD-10-CM | POA: Diagnosis present

## 2019-08-17 DIAGNOSIS — N179 Acute kidney failure, unspecified: Secondary | ICD-10-CM | POA: Diagnosis present

## 2019-08-17 DIAGNOSIS — I110 Hypertensive heart disease with heart failure: Secondary | ICD-10-CM | POA: Diagnosis present

## 2019-08-17 DIAGNOSIS — Z20822 Contact with and (suspected) exposure to covid-19: Secondary | ICD-10-CM | POA: Diagnosis present

## 2019-08-17 DIAGNOSIS — Z7982 Long term (current) use of aspirin: Secondary | ICD-10-CM | POA: Diagnosis not present

## 2019-08-17 DIAGNOSIS — E785 Hyperlipidemia, unspecified: Secondary | ICD-10-CM | POA: Diagnosis present

## 2019-08-17 DIAGNOSIS — R109 Unspecified abdominal pain: Secondary | ICD-10-CM | POA: Diagnosis present

## 2019-08-17 DIAGNOSIS — Z7952 Long term (current) use of systemic steroids: Secondary | ICD-10-CM | POA: Diagnosis not present

## 2019-08-17 DIAGNOSIS — K631 Perforation of intestine (nontraumatic): Secondary | ICD-10-CM | POA: Diagnosis present

## 2019-08-17 DIAGNOSIS — K56609 Unspecified intestinal obstruction, unspecified as to partial versus complete obstruction: Secondary | ICD-10-CM | POA: Diagnosis not present

## 2019-08-17 LAB — BASIC METABOLIC PANEL
Anion gap: 13 (ref 5–15)
BUN: 30 mg/dL — ABNORMAL HIGH (ref 8–23)
CO2: 24 mmol/L (ref 22–32)
Calcium: 8.6 mg/dL — ABNORMAL LOW (ref 8.9–10.3)
Chloride: 105 mmol/L (ref 98–111)
Creatinine, Ser: 1.16 mg/dL — ABNORMAL HIGH (ref 0.44–1.00)
GFR calc Af Amer: 50 mL/min — ABNORMAL LOW (ref >60)
GFR calc non Af Amer: 43 mL/min — ABNORMAL LOW (ref >60)
Glucose, Bld: 114 mg/dL — ABNORMAL HIGH (ref 70–99)
Potassium: 5.2 mmol/L — ABNORMAL HIGH (ref 3.5–5.1)
Sodium: 142 mmol/L (ref 135–145)

## 2019-08-17 LAB — CBC
HCT: 45.8 % (ref 36.0–46.0)
Hemoglobin: 14.4 g/dL (ref 12.0–15.0)
MCH: 31.9 pg (ref 26.0–34.0)
MCHC: 31.4 g/dL (ref 30.0–36.0)
MCV: 101.3 fL — ABNORMAL HIGH (ref 80.0–100.0)
Platelets: 282 10*3/uL (ref 150–400)
RBC: 4.52 MIL/uL (ref 3.87–5.11)
RDW: 15.4 % (ref 11.5–15.5)
WBC: 20.4 10*3/uL — ABNORMAL HIGH (ref 4.0–10.5)
nRBC: 0 % (ref 0.0–0.2)

## 2019-08-17 LAB — COMPREHENSIVE METABOLIC PANEL
ALT: 14 U/L (ref 0–44)
AST: 20 U/L (ref 15–41)
Albumin: 3.4 g/dL — ABNORMAL LOW (ref 3.5–5.0)
Alkaline Phosphatase: 37 U/L — ABNORMAL LOW (ref 38–126)
Anion gap: 10 (ref 5–15)
BUN: 33 mg/dL — ABNORMAL HIGH (ref 8–23)
CO2: 23 mmol/L (ref 22–32)
Calcium: 8.7 mg/dL — ABNORMAL LOW (ref 8.9–10.3)
Chloride: 106 mmol/L (ref 98–111)
Creatinine, Ser: 1.24 mg/dL — ABNORMAL HIGH (ref 0.44–1.00)
GFR calc Af Amer: 46 mL/min — ABNORMAL LOW (ref >60)
GFR calc non Af Amer: 40 mL/min — ABNORMAL LOW (ref >60)
Glucose, Bld: 110 mg/dL — ABNORMAL HIGH (ref 70–99)
Potassium: 4.4 mmol/L (ref 3.5–5.1)
Sodium: 139 mmol/L (ref 135–145)
Total Bilirubin: 0.6 mg/dL (ref 0.3–1.2)
Total Protein: 6.1 g/dL — ABNORMAL LOW (ref 6.5–8.1)

## 2019-08-17 LAB — URINALYSIS, ROUTINE W REFLEX MICROSCOPIC
Bilirubin Urine: NEGATIVE
Glucose, UA: NEGATIVE mg/dL
Hgb urine dipstick: NEGATIVE
Ketones, ur: NEGATIVE mg/dL
Nitrite: NEGATIVE
Protein, ur: NEGATIVE mg/dL
Specific Gravity, Urine: >1.046 — ABNORMAL HIGH (ref 1.005–1.030)
WBC, UA: >50 WBC/hpf — ABNORMAL HIGH (ref 0–5)
pH: 5 (ref 5.0–8.0)

## 2019-08-17 LAB — SARS CORONAVIRUS 2 BY RT PCR (HOSPITAL ORDER, PERFORMED IN ~~LOC~~ HOSPITAL LAB): SARS Coronavirus 2: NEGATIVE

## 2019-08-17 LAB — GLUCOSE, CAPILLARY: Glucose-Capillary: 116 mg/dL — ABNORMAL HIGH (ref 70–99)

## 2019-08-17 LAB — LIPASE, BLOOD: Lipase: 48 U/L (ref 11–51)

## 2019-08-17 LAB — CBG MONITORING, ED
Glucose-Capillary: 134 mg/dL — ABNORMAL HIGH (ref 70–99)
Glucose-Capillary: 140 mg/dL — ABNORMAL HIGH (ref 70–99)

## 2019-08-17 MED ORDER — MORPHINE SULFATE (PF) 4 MG/ML IV SOLN
4.0000 mg | Freq: Once | INTRAVENOUS | Status: AC
  Administered 2019-08-17: 4 mg via INTRAVENOUS
  Filled 2019-08-17: qty 1

## 2019-08-17 MED ORDER — IOHEXOL 300 MG/ML  SOLN
100.0000 mL | Freq: Once | INTRAMUSCULAR | Status: AC | PRN
  Administered 2019-08-17: 80 mL via INTRAVENOUS

## 2019-08-17 MED ORDER — ONDANSETRON HCL 4 MG PO TABS: 4.0000 mg | ORAL_TABLET | Freq: Four times a day (QID) | ORAL | Status: DC | PRN

## 2019-08-17 MED ORDER — PANTOPRAZOLE SODIUM 40 MG IV SOLR
40.0000 mg | Freq: Once | INTRAVENOUS | Status: AC
  Administered 2019-08-17: 40 mg via INTRAVENOUS
  Filled 2019-08-17: qty 40

## 2019-08-17 MED ORDER — DEXTROSE-NACL 5-0.9 % IV SOLN: INTRAVENOUS | Status: DC

## 2019-08-17 MED ORDER — MORPHINE SULFATE (PF) 2 MG/ML IV SOLN
1.0000 mg | INTRAVENOUS | Status: DC | PRN
  Administered 2019-08-17: 1 mg via INTRAVENOUS
  Filled 2019-08-17: qty 1

## 2019-08-17 MED ORDER — DIATRIZOATE MEGLUMINE & SODIUM 66-10 % PO SOLN
90.0000 mL | Freq: Once | ORAL | Status: AC
  Administered 2019-08-17: 90 mL via NASOGASTRIC
  Filled 2019-08-17: qty 90

## 2019-08-17 MED ORDER — SODIUM CHLORIDE 0.9 % IV BOLUS
1000.0000 mL | Freq: Once | INTRAVENOUS | Status: AC
  Administered 2019-08-17: 1000 mL via INTRAVENOUS

## 2019-08-17 MED ORDER — MORPHINE SULFATE (PF) 2 MG/ML IV SOLN: 1.0000 mg | INTRAVENOUS | Status: DC | PRN

## 2019-08-17 MED ORDER — SODIUM CHLORIDE (PF) 0.9 % IJ SOLN
INTRAMUSCULAR | Status: AC
  Filled 2019-08-17: qty 50

## 2019-08-17 MED ORDER — LABETALOL HCL 5 MG/ML IV SOLN
10.0000 mg | INTRAVENOUS | Status: DC | PRN
  Administered 2019-08-19 – 2019-08-21 (×5): 10 mg via INTRAVENOUS
  Filled 2019-08-17 (×5): qty 4

## 2019-08-17 MED ORDER — LEVOTHYROXINE SODIUM 100 MCG/5ML IV SOLN
12.5000 ug | Freq: Every day | INTRAVENOUS | Status: DC
  Administered 2019-08-17 – 2019-08-30 (×14): 12.5 ug via INTRAVENOUS
  Filled 2019-08-17 (×14): qty 5

## 2019-08-17 MED ORDER — ONDANSETRON HCL 4 MG/2ML IJ SOLN
4.0000 mg | Freq: Four times a day (QID) | INTRAMUSCULAR | Status: DC | PRN
  Administered 2019-08-19 – 2019-08-31 (×5): 4 mg via INTRAVENOUS
  Filled 2019-08-17 (×6): qty 2

## 2019-08-17 MED ORDER — FENTANYL CITRATE (PF) 100 MCG/2ML IJ SOLN
50.0000 ug | Freq: Once | INTRAMUSCULAR | Status: AC
  Administered 2019-08-17: 50 ug via INTRAVENOUS
  Filled 2019-08-17: qty 2

## 2019-08-17 MED ORDER — FENTANYL CITRATE (PF) 100 MCG/2ML IJ SOLN
25.0000 ug | INTRAMUSCULAR | Status: DC | PRN
  Administered 2019-08-17 – 2019-08-22 (×3): 25 ug via INTRAVENOUS
  Filled 2019-08-17 (×3): qty 2

## 2019-08-17 MED ORDER — METHYLPREDNISOLONE SODIUM SUCC 40 MG IJ SOLR
20.0000 mg | Freq: Every day | INTRAMUSCULAR | Status: DC
  Administered 2019-08-17 – 2019-08-19 (×3): 20 mg via INTRAVENOUS
  Filled 2019-08-17 (×3): qty 1

## 2019-08-17 MED ORDER — ACETAMINOPHEN 10 MG/ML IV SOLN
1000.0000 mg | Freq: Three times a day (TID) | INTRAVENOUS | Status: AC
  Administered 2019-08-17 – 2019-08-18 (×3): 1000 mg via INTRAVENOUS
  Filled 2019-08-17 (×2): qty 100

## 2019-08-17 NOTE — Progress Notes (Signed)
Pt arrived to unit via stretcher with two staff members and son. A&O x 4 oriented to room, call light in reach and safety measures in place.

## 2019-08-17 NOTE — Consult Note (Signed)
[facility] Surgery Consult Note  Pamela Dunn 28-Jul-1931  [id number].    Requesting MD: Toniann Fail, MD Chief Complaint/Reason for Consult: SBO  HPI:  Pamela Dunn is an 86 y/o F with a PMH rheumatoid arthritis, hypothyroidism, HTN, cardiomyopathy, HLD, GERD, and PUD who presented to the ED with a cc abdominal pain, nausea, and emesis that started acutely yesterday evening. Pain is reported as all across her abdomen at the level of her umbilicus, it is non-radiating and slightly improves with morphing. She reports a history of upper abdominal pain many years ago due to a peptic ulcer but denies any other history of abdominal pain. Denies hematochezia or melena. Reports normal, daily BMs. Denies flatus since early yesterday, which she says is unusual for her. Denies sick contacts. Denies a history of abnormal colonoscopy or colon cancer (colonoscopy 2011 done at [facility] shows multiple tubular adenomas and hyperplastic polys, no dysplasia).  Takes prednisone for RA. Takes daily ASA. Denies use of anticoagulants.   PSH: appendectomy, knee surgery; states she was supposed to get surgery on her R eye (clogged tear duct) but they were unable to perform the operation because they were unable to intubate her 2/2 airway swelling.  Social Hx: denies tobacco, alcohol, illicit drug use. At baseline lives alone in her home. Mobilizes using a walker or holding on to furniture in her house. Still drives occasionally. Her great grandson is at bedside.   ED workup: WBC 14,500, BUN 33, SCr 1.25 COVID test was negative   ROS: Review of Systems  All other systems reviewed and are negative.  Family History  Problem Relation Age of Onset   Breast cancer Maternal Aunt    Cancer Mother    Cancer - Colon Sister    Heart disease Sister    Cancer - Colon Brother    Cancer - Colon Brother     Past Medical History:  Diagnosis Date   Allergy    seasonal   Anemia    Arthritis    GERD  (gastroesophageal reflux disease)    Hyperlipidemia    Hypertension     Past Surgical History:  Procedure Laterality Date   APPENDECTOMY     BACK SURGERY      Social History:  reports that she has never smoked. She has never used smokeless tobacco. She reports that she does not drink alcohol or use drugs.  Allergies:  Allergies  Allergen Reactions   Coreg [Carvedilol] Diarrhea    (Not in a hospital admission)   Blood pressure 120/61, pulse (!) 56, temperature 98.3 F (36.8 C), temperature source Oral, resp. rate 16, SpO2 100 %. Physical Exam: Constitutional: NAD; conversant; no deformities Eyes: Moist conjunctiva; L lid closed with some discharge on eyelashes, anicteric; PERRL Neck: Trachea midline; no thyromegaly Lungs: Normal respiratory effort; no tactile fremitus CV: RRR; no palpable thrills; no pitting edema GI: Abd soft, mild distention, TTP globally but worse over left hemi-abdomen without guarding or peritonitis; no palpable hepatosplenomegaly MSK: symmetrical, no clubbing/cyanosis Psychiatric: Appropriate affect; alert and oriented x3 Lymphatic: No palpable cervical or axillary lymphadenopathy  Results for orders placed or performed during the hospital encounter of 08/16/19 (from the past 48 hour(s))  Comprehensive metabolic panel     Status: Abnormal   Collection Time: 08/16/19 11:32 PM  Result Value Ref Range   Sodium 139 135 - 145 mmol/L   Potassium 4.4 3.5 - 5.1 mmol/L   Chloride 106 98 - 111 mmol/L   CO2 23 22 - 32  mmol/L   Glucose, Bld 110 (H) 70 - 99 mg/dL    Comment: Glucose reference range applies only to samples taken after fasting for at least 8 hours.   BUN 33 (H) 8 - 23 mg/dL   Creatinine, Ser 1.24 (H) 0.44 - 1.00 mg/dL   Calcium 8.7 (L) 8.9 - 10.3 mg/dL   Total Protein 6.1 (L) 6.5 - 8.1 g/dL   Albumin 3.4 (L) 3.5 - 5.0 g/dL   AST 20 15 - 41 U/L   ALT 14 0 - 44 U/L   Alkaline Phosphatase 37 (L) 38 - 126 U/L   Total Bilirubin 0.6 0.3 -  1.2 mg/dL   GFR calc non Af Amer 40 (L) >60 mL/min   GFR calc Af Amer 46 (L) >60 mL/min   Anion gap 10 5 - 15    Comment: Performed at [facility], [place] [address], [place], [place] [address]  CBC with Differential     Status: Abnormal   Collection Time: 08/16/19 11:32 PM  Result Value Ref Range   WBC 14.5 (H) 4.0 - 10.5 K/uL   RBC 4.09 3.87 - 5.11 MIL/uL   Hemoglobin 12.9 12.0 - 15.0 g/dL   HCT 40.6 36.0 - 46.0 %   MCV 99.3 80.0 - 100.0 fL   MCH 31.5 26.0 - 34.0 pg   MCHC 31.8 30.0 - 36.0 g/dL   RDW 15.2 11.5 - 15.5 %   Platelets 282 150 - 400 K/uL   nRBC 0.0 0.0 - 0.2 %   Neutrophils Relative % 73 %   Neutro Abs 10.7 (H) 1.7 - 7.7 K/uL   Lymphocytes Relative 16 %   Lymphs Abs 2.3 0.7 - 4.0 K/uL   Monocytes Relative 7 %   Monocytes Absolute 1.0 0.1 - 1.0 K/uL   Eosinophils Relative 1 %   Eosinophils Absolute 0.2 0.0 - 0.5 K/uL   Basophils Relative 1 %   Basophils Absolute 0.1 0.0 - 0.1 K/uL   Immature Granulocytes 2 %   Abs Immature Granulocytes 0.30 (H) 0.00 - 0.07 K/uL    Comment: Performed at [facility], [place] [address], [place], [place] [address]  Lipase, blood     Status: None   Collection Time: 08/16/19 11:32 PM  Result Value Ref Range   Lipase 48 11 - 51 U/L    Comment: Performed at [facility], [place] [address], [place], [place] [address]  CBG monitoring, ED     Status: Abnormal   Collection Time: 08/16/19 11:41 PM  Result Value Ref Range   Glucose-Capillary 104 (H) 70 - 99 mg/dL    Comment: Glucose reference range applies only to samples taken after fasting for at least 8 hours.  SARS Coronavirus 2 by RT PCR (hospital order, performed in [facility] hospital lab) Nasopharyngeal Nasopharyngeal Swab     Status: None   Collection Time: 08/17/19  3:00 AM   Specimen: Nasopharyngeal Swab  Result Value Ref Range   SARS Coronavirus 2 NEGATIVE NEGATIVE    Comment: (NOTE) SARS-CoV-2 target nucleic acids are NOT  DETECTED. The SARS-CoV-2 RNA is generally detectable in upper and lower respiratory specimens during the acute phase of infection. The lowest concentration of SARS-CoV-2 viral copies this assay can detect is 250 copies / mL. A negative result does not preclude SARS-CoV-2 infection and should not be used as the sole basis for treatment or other patient management decisions.  A negative result may occur with improper specimen collection /  handling, submission of specimen other than nasopharyngeal swab, presence of viral mutation(s) within the areas targeted by this assay, and inadequate number of viral copies (<250 copies / mL). A negative result must be combined with clinical observations, patient history, and epidemiological information. Fact Sheet for Patients:   BoilerBrush.com.cy Fact Sheet for Healthcare Providers: https://pope.com/ This test is not yet approved or cleared  by the [place] FDA and has been authorized for detection and/or diagnosis of SARS-CoV-2 by FDA under an Emergency Use Authorization (EUA).  This EUA will remain in effect (meaning this test can be used) for the duration of the COVID-19 declaration under Section 564(b)(1) of the Act, 21 U.S.C. section 360bbb-3(b)(1), unless the authorization is terminated or revoked sooner. Performed at [facility], [address], [place], [place] [address]   CBC     Status: Abnormal   Collection Time: 08/17/19  6:41 AM  Result Value Ref Range   WBC 20.4 (H) 4.0 - 10.5 K/uL   RBC 4.52 3.87 - 5.11 MIL/uL   Hemoglobin 14.4 12.0 - 15.0 g/dL   HCT 25.0 53.9 - 76.7 %   MCV 101.3 (H) 80.0 - 100.0 fL   MCH 31.9 26.0 - 34.0 pg   MCHC 31.4 30.0 - 36.0 g/dL   RDW 34.1 93.7 - 90.2 %   Platelets 282 150 - 400 K/uL   nRBC 0.0 0.0 - 0.2 %    Comment: Performed at [facility], [address], [place], [place] [address]   CT Abdomen Pelvis W  Contrast  Result Date: 08/17/2019 CLINICAL DATA:  Vomiting.  Abdominal pain. EXAM: CT ABDOMEN AND PELVIS WITH CONTRAST TECHNIQUE: Multidetector CT imaging of the abdomen and pelvis was performed using the standard protocol following bolus administration of intravenous contrast. CONTRAST:  51mL OMNIPAQUE IOHEXOL 300 MG/ML  SOLN COMPARISON:  September 06, 2013 FINDINGS: Lower chest: The lung bases are clear. The heart is enlarged. Hepatobiliary: The liver is normal. Status post cholecystectomy.There is no biliary ductal dilation. Pancreas: Normal contours without ductal dilatation. No peripancreatic fluid collection. Spleen: Unremarkable. Adrenals/Urinary Tract: --Adrenal glands: Unremarkable. --Right kidney/ureter: No hydronephrosis or radiopaque kidney stones. --Left kidney/ureter: No hydronephrosis or radiopaque kidney stones. --Urinary bladder: Unremarkable. Stomach/Bowel: --Stomach/Duodenum: No hiatal hernia or other gastric abnormality. Normal duodenal course and caliber. --Small bowel: There are dilated loops of small bowel in the low midline abdomen. There is a transition point in the mid abdomen (axial series 2, image 43). There is no pneumatosis. There is no free air. The distal small bowel is decompressed. --Colon: Unremarkable. --Appendix: Not visualized. No right lower quadrant inflammation or free fluid. Vascular/Lymphatic: Atherosclerotic calcification is present within the non-aneurysmal abdominal aorta, without hemodynamically significant stenosis. --No retroperitoneal lymphadenopathy. --No mesenteric lymphadenopathy. --No pelvic or inguinal lymphadenopathy. Reproductive: Unremarkable Other: There is a small amount of free fluid in the patient's abdomen and pelvis. Mesenteric edema is noted. Musculoskeletal. No acute displaced fractures. IMPRESSION: 1. Small bowel obstruction with transition point in the mid abdomen. 2. Small amount of free fluid in the patient's abdomen and pelvis, likely reactive. No  free air. Aortic Atherosclerosis (ICD10-I70.0). Electronically Signed   By: Katherine Mantle M.D.   On: 08/17/2019 02:24   DG Chest Port 1 View  Result Date: 08/17/2019 CLINICAL DATA:  Abdominal pain EXAM: PORTABLE CHEST 1 VIEW COMPARISON:  None. FINDINGS: The heart size and mediastinal contours are within normal limits. Both lungs are clear. The visualized skeletal structures are unremarkable. IMPRESSION: No active disease. Electronically  Signed   By: Deatra Robinson M.D.   On: 08/17/2019 00:08   Assessment/Plan HTN HLD Hypothyroidism GERD PUD Leukocytosis - 20,400 from 14,500; CXR without active disease, UA pending,   SBO -CT abd pelvis 6/8 w/ transition point in the mid abdomen, small amt free fluid, no free air. - no acute surgical needs  - recommend NG tube decompression and SB protocol with gastrografin - PRN analgesics and antiemetics  - CCS will follow   Adam Phenix, [facility] Surgery 08/17/2019, 7:35 AM

## 2019-08-17 NOTE — ED Notes (Signed)
IV team at bedside 

## 2019-08-17 NOTE — Progress Notes (Signed)
08/17/2019  1205 Notified MD of patient's BP 88/46. Waiting on orders.

## 2019-08-17 NOTE — Progress Notes (Signed)
PROGRESS NOTE    Pamela Dunn  PFX:[id number] DOB: 06-17-1931 DOA: 08/16/2019 PCP: Pearson Grippe, MD   Brief Narrative: Pamela Dunn is a 86 y.o. female with a history of cardiomyopathy, rheumatoid arthritis, hypothyroidism. Patient presents secondary to abdominal pain and found to have a small bowel obstruction with transition point. General surgery consulted for co-management.    Assessment & Plan:   Principal Problem:   SBO (small bowel obstruction) (HCC) Active Problems:   Rheumatoid arthritis (HCC)   Small bowel obstruction CT scan significant for SBO with transition point in the mid abdomen. General surgery consulted. No current nausea. -General surgery recommendations: NG tube with attempt at non-operative management  Hypotension Unsure of etiology. No worsening symptoms. Improved with IV fluid bolus -Continue maintenance IV fluids  Rheumatoid arthritis On prednisone 5 mg daily as an outpatient. Started on Solu-medrol IV secondary to NPO status -Continue Solu-medrol IV  Hypothyroidism -Continue Synthroid  Hyperkalemia Mild. Likely in setting of mild creatinine elevation.  Elevated creatinine Baseline creatinine of 1.1. mildly elevated to 1.24 on admission and trended down with IV fluids.  History of non-ischemic cardiomyopathy Chronic combined systolic and diastolic heart failure EF of 40-45% from 4/8 with associated grade I diastolic heart dysfunction. Patient is not on diuretic therapy as an outpatient.  Leukocytosis WBC of 14.5K on admission with worsening to 20.4K in setting of IV solumedrol.  Hypertension Patient is on Bidil, Toprol-XL as an outpatient.  Hyperlipidemia On Zetia and Coenzyme Q10 as an outpatient.   DVT prophylaxis: SCDs Code Status:   Code Status: Full Code Family Communication: Son at bedside Disposition Plan: Discharge likely home pending progression of SBO. Likely several days.   Consultants:   General surgery  Procedures:     None  Antimicrobials:  None    Subjective: Some abdominal pain but it has improved with analgesics.  Objective: Vitals:   08/17/19 1522 08/17/19 1600 08/17/19 1700 08/17/19 1730  BP: 105/61 112/60 116/61 121/73  Pulse: 65 66 67 70  Resp: 20 16 (!) 21 19  Temp:      TempSrc:      SpO2: 90% 90% 91% (!) 89%    Intake/Output Summary (Last 24 hours) at 08/17/2019 1747 Last data filed at 08/17/2019 1600 Gross per 24 hour  Intake 1814.98 ml  Output 100 ml  Net 1714.98 ml   There were no vitals filed for this visit.  Examination:  General exam: Appears calm and comfortable Respiratory system: Clear to auscultation. Respiratory effort normal. Cardiovascular system: S1 & S2 heard, RRR. No murmurs, rubs, gallops or clicks. Gastrointestinal system: Abdomen is nondistended, soft and tender in RLQ. Normal bowel sounds heard. Central nervous system: Alert and oriented. No focal neurological deficits. Musculoskeletal: No edema. No calf tenderness. Arthritic changes noted of bilateral ankles. Skin: No cyanosis. No rashes Psychiatry: Judgement and insight appear normal. Mood & affect appropriate.     Data Reviewed: I have personally reviewed following labs and imaging studies  CBC Lab Results  Component Value Date   WBC 20.4 (H) 08/17/2019   RBC 4.52 08/17/2019   HGB 14.4 08/17/2019   HCT 45.8 08/17/2019   MCV 101.3 (H) 08/17/2019   MCH 31.9 08/17/2019   PLT 282 08/17/2019   MCHC 31.4 08/17/2019   RDW 15.4 08/17/2019   LYMPHSABS 2.3 08/16/2019   MONOABS 1.0 08/16/2019   EOSABS 0.2 08/16/2019   BASOSABS 0.1 08/16/2019     Last metabolic panel Lab Results  Component Value Date   NA  142 08/17/2019   K 5.2 (H) 08/17/2019   CL 105 08/17/2019   CO2 24 08/17/2019   BUN 30 (H) 08/17/2019   CREATININE 1.16 (H) 08/17/2019   GLUCOSE 114 (H) 08/17/2019   GFRNONAA 43 (L) 08/17/2019   GFRAA 50 (L) 08/17/2019   CALCIUM 8.6 (L) 08/17/2019   PROT 6.1 (L) 08/16/2019    ALBUMIN 3.4 (L) 08/16/2019   BILITOT 0.6 08/16/2019   ALKPHOS 37 (L) 08/16/2019   AST 20 08/16/2019   ALT 14 08/16/2019   ANIONGAP 13 08/17/2019    CBG (last 3)  Recent Labs    08/16/19 2341 08/17/19 0759 08/17/19 1640  GLUCAP 104* 134* 140*     GFR: CrCl cannot be calculated (Unknown ideal weight.).  Coagulation Profile: No results for input(s): INR, PROTIME in the last 168 hours.  Recent Results (from the past 240 hour(s))  SARS Coronavirus 2 by RT PCR (hospital order, performed in [facility] hospital lab) Nasopharyngeal Nasopharyngeal Swab     Status: None   Collection Time: 08/17/19  3:00 AM   Specimen: Nasopharyngeal Swab  Result Value Ref Range Status   SARS Coronavirus 2 NEGATIVE NEGATIVE Final    Comment: (NOTE) SARS-CoV-2 target nucleic acids are NOT DETECTED. The SARS-CoV-2 RNA is generally detectable in upper and lower respiratory specimens during the acute phase of infection. The lowest concentration of SARS-CoV-2 viral copies this assay can detect is 250 copies / mL. A negative result does not preclude SARS-CoV-2 infection and should not be used as the sole basis for treatment or other patient management decisions.  A negative result may occur with improper specimen collection / handling, submission of specimen other than nasopharyngeal swab, presence of viral mutation(s) within the areas targeted by this assay, and inadequate number of viral copies (<250 copies / mL). A negative result must be combined with clinical observations, patient history, and epidemiological information. Fact Sheet for Patients:   StrictlyIdeas.no Fact Sheet for Healthcare Providers: BankingDealers.co.za This test is not yet approved or cleared  by the [place] FDA and has been authorized for detection and/or diagnosis of SARS-CoV-2 by FDA under an Emergency Use Authorization (EUA).  This EUA will remain in effect (meaning this  test can be used) for the duration of the COVID-19 declaration under Section 564(b)(1) of the Act, 21 U.S.C. section 360bbb-3(b)(1), unless the authorization is terminated or revoked sooner. Performed at [facility], [place] [address] [place], [place] [address]         Radiology Studies: CT Abdomen Pelvis W Contrast  Result Date: 08/17/2019 CLINICAL DATA:  Vomiting.  Abdominal pain. EXAM: CT ABDOMEN AND PELVIS WITH CONTRAST TECHNIQUE: Multidetector CT imaging of the abdomen and pelvis was performed using the standard protocol following bolus administration of intravenous contrast. CONTRAST:  15mL OMNIPAQUE IOHEXOL 300 MG/ML  SOLN COMPARISON:  September 06, 2013 FINDINGS: Lower chest: The lung bases are clear. The heart is enlarged. Hepatobiliary: The liver is normal. Status post cholecystectomy.There is no biliary ductal dilation. Pancreas: Normal contours without ductal dilatation. No peripancreatic fluid collection. Spleen: Unremarkable. Adrenals/Urinary Tract: --Adrenal glands: Unremarkable. --Right kidney/ureter: No hydronephrosis or radiopaque kidney stones. --Left kidney/ureter: No hydronephrosis or radiopaque kidney stones. --Urinary bladder: Unremarkable. Stomach/Bowel: --Stomach/Duodenum: No hiatal hernia or other gastric abnormality. Normal duodenal course and caliber. --Small bowel: There are dilated loops of small bowel in the low midline abdomen. There is a transition point in the mid abdomen (axial series 2, image 43). There is no pneumatosis. There is no  free air. The distal small bowel is decompressed. --Colon: Unremarkable. --Appendix: Not visualized. No right lower quadrant inflammation or free fluid. Vascular/Lymphatic: Atherosclerotic calcification is present within the non-aneurysmal abdominal aorta, without hemodynamically significant stenosis. --No retroperitoneal lymphadenopathy. --No mesenteric lymphadenopathy. --No pelvic or inguinal lymphadenopathy. Reproductive:  Unremarkable Other: There is a small amount of free fluid in the patient's abdomen and pelvis. Mesenteric edema is noted. Musculoskeletal. No acute displaced fractures. IMPRESSION: 1. Small bowel obstruction with transition point in the mid abdomen. 2. Small amount of free fluid in the patient's abdomen and pelvis, likely reactive. No free air. Aortic Atherosclerosis (ICD10-I70.0). Electronically Signed   By: Katherine Mantle M.D.   On: 08/17/2019 02:24   DG Chest Port 1 View  Result Date: 08/17/2019 CLINICAL DATA:  Abdominal pain EXAM: PORTABLE CHEST 1 VIEW COMPARISON:  None. FINDINGS: The heart size and mediastinal contours are within normal limits. Both lungs are clear. The visualized skeletal structures are unremarkable. IMPRESSION: No active disease. Electronically Signed   By: Deatra Robinson M.D.   On: 08/17/2019 00:08   DG Abd Portable 1V  Result Date: 08/17/2019 CLINICAL DATA:  NG tube placement EXAM: PORTABLE ABDOMEN - 1 VIEW COMPARISON:  08/17/2019 FINDINGS: NG tube extends below the hemidiaphragms into the distal stomach. Stomach appears collapsed. Small hiatal hernia noted. Nonspecific gaseous distention of the colon. No obstruction pattern. Degenerative changes of the spine. Aortoiliac atherosclerosis noted. Basilar scarring/atelectasis present. IMPRESSION: NG tube distal stomach. Electronically Signed   By: Judie Petit.  Shick M.D.   On: 08/17/2019 14:03   DG Abd Portable 1V-Small Bowel Protocol-Position Verification  Result Date: 08/17/2019 CLINICAL DATA:  Nasogastric tube insertion EXAM: PORTABLE ABDOMEN - 1 VIEW COMPARISON:  CT from earlier today FINDINGS: The nasogastric tube loops at the level of the diaphragm, based on prior CT likely looping in a hiatal hernia. Small bowel obstruction with affected loops essentially not covered on this study. Cardiomegaly. Visualized lungs are clear IMPRESSION: The nasogastric tube loops near the diaphragm, suspect it is within a small hiatal hernia based on  prior CT. Electronically Signed   By: Marnee Spring M.D.   On: 08/17/2019 11:48        Scheduled Meds: . levothyroxine  12.5 mcg Intravenous Daily  . methylPREDNISolone (SOLU-MEDROL) injection  20 mg Intravenous Daily   Continuous Infusions: . acetaminophen 1,000 mg (08/17/19 1742)  . dextrose 5 % and 0.9% NaCl 75 mL/hr at 08/17/19 0644     LOS: 0 days     Jacquelin Hawking, MD [facility] 08/17/2019, 5:47 PM  If 7PM-7AM, please contact night-coverage [email or web address]

## 2019-08-17 NOTE — ED Notes (Addendum)
Patient has a loss of IV access. Multiple attempts to establish a new line have been made. Admitting MD made aware.  Morning labs and medications delayed.

## 2019-08-17 NOTE — ED Provider Notes (Signed)
Care assumed from Dr. Madilyn Hook, patient with abdominal pain and vomiting, CT scan of abdomen pending.  CT scan shows evidence of a small bowel obstruction.  She had received some morphine for pain but it has not given her adequate relief.  She is given additional dose of morphine.  Case is discussed with Dr. Toniann Fail of [facility], who agrees to admit the patient.   Dione Booze, MD 08/17/19 [phone]

## 2019-08-17 NOTE — H&P (Signed)
History and Physical    Pamela Dunn ZYS:[id number] DOB: [birth year] DOA: 08/16/2019  PCP: Jani Gravel, MD  Patient coming from: Home.  Chief Complaint: Abdominal pain.  HPI: Pamela Dunn is a 86 y.o. female with history of cardiomyopathy, rheumatoid arthritis, hypothyroidism presents to the ER with complaint of sudden onset of abdominal pain since yesterday evening.  Had multiple episodes of vomiting.  Last bowel movement was more than 24 hours ago.  Denies any fever chills.  Denies chest pain or shortness of breath.  ED Course: The ER CT abdomen pelvis shows features concerning for bowel obstruction.  Patient admitted for further management.  Labs showed leukocytosis with WBC count of 14.5 creatinine 1.2.  Covid test is pending.  Review of Systems: As per HPI, rest all negative.   Past Medical History:  Diagnosis Date  . Allergy    seasonal  . Anemia   . Arthritis   . GERD (gastroesophageal reflux disease)   . Hyperlipidemia   . Hypertension     Past Surgical History:  Procedure Laterality Date  . APPENDECTOMY    . BACK SURGERY       reports that she has never smoked. She has never used smokeless tobacco. She reports that she does not drink alcohol or use drugs.  Allergies  Allergen Reactions  . Coreg [Carvedilol] Diarrhea    Family History  Problem Relation Age of Onset  . Breast cancer Maternal Aunt   . Cancer Mother   . Cancer - Colon Sister   . Heart disease Sister   . Cancer - Colon Brother   . Cancer - Colon Brother     Prior to Admission medications   Medication Sig Start Date End Date Taking? Authorizing Provider  Aspirin-Caffeine (BAYER BACK & BODY PAIN EX ST) 500-32.5 MG TABS Take 1 tablet by mouth daily as needed (pain).    Yes [provider]  BIDIL 20-37.5 MG tablet TAKE 1 TABLET BY MOUTH THREE TIMES A DAY Patient taking differently: Take 1 tablet by mouth 3 (three) times daily.  06/13/19  Yes Adrian Prows, MD  Boswellia-Glucosamine-Vit D  (OSTEO BI-FLEX ONE PER DAY PO) Take 1 tablet by mouth daily.   Yes [provider]  cholecalciferol (VITAMIN D) 1000 UNITS tablet Take 1,000 Units by mouth daily.    Yes [provider]  Coenzyme Q10 (COQ10) 200 MG CAPS Take 200 mg by mouth daily.   Yes [provider]  ezetimibe (ZETIA) 10 MG tablet Take 5 mg by mouth daily.   Yes [provider]  folic acid (FOLVITE) 1 MG tablet Take 1 mg by mouth daily.  05/03/18  Yes [provider]  levothyroxine (SYNTHROID) 25 MCG tablet Take 25 mcg by mouth daily.    Yes [provider]  metoprolol succinate (TOPROL-XL) 50 MG 24 hr tablet TAKE 1 TABLET (50 MG TOTAL) BY MOUTH DAILY. TAKE WITH OR IMMEDIATELY FOLLOWING A MEAL. 04/26/19 08/17/19 Yes Adrian Prows, MD  moxifloxacin (VIGAMOX) 0.5 % ophthalmic solution Place 1 drop into both eyes daily.  05/30/19  Yes [provider]  Multiple Vitamins-Minerals (ICAPS AREDS 2 PO) Take 1 capsule by mouth daily.   Yes [provider]  predniSONE (DELTASONE) 5 MG tablet Take 5 mg by mouth daily. 11/03/18  Yes [provider]    Physical Exam: Constitutional: Moderately built and nourished. Vitals:   08/17/19 0215 08/17/19 0245 08/17/19 0315 08/17/19 0345  BP: (!) 143/68 129/61 (!) 122/56 (!) 115/58  Pulse: (!) 54 (!) 52 (!) 53 (!) 55  Resp: 13 14 16 16   Temp:      TempSrc:      SpO2: 97% 98% 99% 96%   Eyes: Anicteric no pallor. ENMT: No discharge from the ears eyes nose or mouth. Neck: No mass felt.  No neck rigidity. Respiratory: No rhonchi or crepitations. Cardiovascular: S1-S2 heard. Abdomen: Soft mildly distended nontender bowel sounds present. Musculoskeletal: No edema. Skin: No rash. Neurologic: Alert awake oriented time place and person.  Moves all extremities. Psychiatric: Appears normal.   Labs on Admission: I have personally reviewed following labs and imaging studies  CBC: Recent Labs  Lab 08/16/19 2332  WBC 14.5*   NEUTROABS 10.7*  HGB 12.9  HCT 40.6  MCV 99.3  PLT 282   Basic Metabolic Panel: Recent Labs  Lab 08/16/19 2332  NA 139  K 4.4  CL 106  CO2 23  GLUCOSE 110*  BUN 33*  CREATININE 1.24*  CALCIUM 8.7*   GFR: CrCl cannot be calculated (Unknown ideal weight.). Liver Function Tests: Recent Labs  Lab 08/16/19 2332  AST 20  ALT 14  ALKPHOS 37*  BILITOT 0.6  PROT 6.1*  ALBUMIN 3.4*   Recent Labs  Lab 08/16/19 2332  LIPASE 48   No results for input(s): AMMONIA in the last 168 hours. Coagulation Profile: No results for input(s): INR, PROTIME in the last 168 hours. Cardiac Enzymes: No results for input(s): CKTOTAL, CKMB, CKMBINDEX, TROPONINI in the last 168 hours. BNP (last 3 results) No results for input(s): PROBNP in the last 8760 hours. HbA1C: No results for input(s): HGBA1C in the last 72 hours. CBG: Recent Labs  Lab 08/16/19 2341  GLUCAP 104*   Lipid Profile: No results for input(s): CHOL, HDL, LDLCALC, TRIG, CHOLHDL, LDLDIRECT in the last 72 hours. Thyroid Function Tests: No results for input(s): TSH, T4TOTAL, FREET4, T3FREE, THYROIDAB in the last 72 hours. Anemia Panel: No results for input(s): VITAMINB12, FOLATE, FERRITIN, TIBC, IRON, RETICCTPCT in the last 72 hours. Urine analysis: No results found for: COLORURINE, APPEARANCEUR, LABSPEC, PHURINE, GLUCOSEU, HGBUR, BILIRUBINUR, KETONESUR, PROTEINUR, UROBILINOGEN, NITRITE, LEUKOCYTESUR Sepsis Labs: @LABRCNTIP (procalcitonin:4,lacticidven:4) )No results found for this or any previous visit (from the past 240 hour(s)).   Radiological Exams on Admission: CT Abdomen Pelvis W Contrast  Result Date: 08/17/2019 CLINICAL DATA:  Vomiting.  Abdominal pain. EXAM: CT ABDOMEN AND PELVIS WITH CONTRAST TECHNIQUE: Multidetector CT imaging of the abdomen and pelvis was performed using the standard protocol following bolus administration of intravenous contrast. CONTRAST:  62mL OMNIPAQUE IOHEXOL 300 MG/ML  SOLN COMPARISON:   September 06, 2013 FINDINGS: Lower chest: The lung bases are clear. The heart is enlarged. Hepatobiliary: The liver is normal. Status post cholecystectomy.There is no biliary ductal dilation. Pancreas: Normal contours without ductal dilatation. No peripancreatic fluid collection. Spleen: Unremarkable. Adrenals/Urinary Tract: --Adrenal glands: Unremarkable. --Right kidney/ureter: No hydronephrosis or radiopaque kidney stones. --Left kidney/ureter: No hydronephrosis or radiopaque kidney stones. --Urinary bladder: Unremarkable. Stomach/Bowel: --Stomach/Duodenum: No hiatal hernia or other gastric abnormality. Normal duodenal course and caliber. --Small bowel: There are dilated loops of small bowel in the low midline abdomen. There is a transition point in the mid abdomen (axial series 2, image 43). There is no pneumatosis. There is no free air. The distal small bowel is decompressed. --Colon: Unremarkable. --Appendix: Not visualized. No right lower quadrant inflammation or free fluid. Vascular/Lymphatic: Atherosclerotic calcification is present within the non-aneurysmal abdominal aorta, without hemodynamically significant stenosis. --No retroperitoneal lymphadenopathy. --No mesenteric lymphadenopathy. --No pelvic or  inguinal lymphadenopathy. Reproductive: Unremarkable Other: There is a small amount of free fluid in the patient's abdomen and pelvis. Mesenteric edema is noted. Musculoskeletal. No acute displaced fractures. IMPRESSION: 1. Small bowel obstruction with transition point in the mid abdomen. 2. Small amount of free fluid in the patient's abdomen and pelvis, likely reactive. No free air. Aortic Atherosclerosis (ICD10-I70.0). Electronically Signed   By: Katherine Mantle M.D.   On: 08/17/2019 02:24   DG Chest Port 1 View  Result Date: 08/17/2019 CLINICAL DATA:  Abdominal pain EXAM: PORTABLE CHEST 1 VIEW COMPARISON:  None. FINDINGS: The heart size and mediastinal contours are within normal limits. Both lungs are  clear. The visualized skeletal structures are unremarkable. IMPRESSION: No active disease. Electronically Signed   By: Deatra Robinson M.D.   On: 08/17/2019 00:08    EKG: Independently reviewed.  Normal sinus rhythm LBBB.  Assessment/Plan Principal Problem:   SBO (small bowel obstruction) (HCC) Active Problems:   Rheumatoid arthritis (HCC)    1. Small bowel obstruction -we will keep patient n.p.o.  Patient is hesitant to get a NG tube since patient had problems with anesthesia in March 2021 when patient was found to have irregular edema.  Pain relief medications IV fluids.  Consult general surgery. 2. Rheumatoid arthritis on chronic steroids we will keep patient IV Solu-Medrol. 3. Hypothyroidism we will keep patient IV Synthroid. 4. History of nonischemic cardiomyopathy we will keep patient as needed IV labetalol for now.  Since patient has small bowel obstruction will need more than 2 midnight stay in inpatient status.  Covid test is pending.   DVT prophylaxis: SCDs. Code Status: Full code. Family Communication: Discussed with patient. Disposition Plan: Home. Consults called: Will consult surgery. Admission status: Inpatient.   Eduard Clos MD [facility] Pager [phone].  If 7PM-7AM, please contact night-coverage [email or web address] Password [email or web address]  08/17/2019, 4:16 AM

## 2019-08-18 ENCOUNTER — Inpatient Hospital Stay (HOSPITAL_COMMUNITY): Payer: Medicare PPO

## 2019-08-18 DIAGNOSIS — E039 Hypothyroidism, unspecified: Secondary | ICD-10-CM

## 2019-08-18 DIAGNOSIS — M069 Rheumatoid arthritis, unspecified: Secondary | ICD-10-CM

## 2019-08-18 LAB — GLUCOSE, CAPILLARY
Glucose-Capillary: 114 mg/dL — ABNORMAL HIGH (ref 70–99)
Glucose-Capillary: 108 mg/dL — ABNORMAL HIGH (ref 70–99)

## 2019-08-18 LAB — CBC
HCT: 43.1 % (ref 36.0–46.0)
Hemoglobin: 13.6 g/dL (ref 12.0–15.0)
MCH: 31.9 pg (ref 26.0–34.0)
MCHC: 31.6 g/dL (ref 30.0–36.0)
MCV: 100.9 fL — ABNORMAL HIGH (ref 80.0–100.0)
Platelets: 270 10*3/uL (ref 150–400)
RBC: 4.27 MIL/uL (ref 3.87–5.11)
RDW: 15.7 % — ABNORMAL HIGH (ref 11.5–15.5)
WBC: 16.6 10*3/uL — ABNORMAL HIGH (ref 4.0–10.5)
nRBC: 0.1 % (ref 0.0–0.2)

## 2019-08-18 LAB — BASIC METABOLIC PANEL
Anion gap: 12 (ref 5–15)
BUN: 33 mg/dL — ABNORMAL HIGH (ref 8–23)
CO2: 22 mmol/L (ref 22–32)
Calcium: 8.1 mg/dL — ABNORMAL LOW (ref 8.9–10.3)
Chloride: 108 mmol/L (ref 98–111)
Creatinine, Ser: 1.4 mg/dL — ABNORMAL HIGH (ref 0.44–1.00)
GFR calc Af Amer: 40 mL/min — ABNORMAL LOW (ref >60)
GFR calc non Af Amer: 34 mL/min — ABNORMAL LOW (ref >60)
Glucose, Bld: 123 mg/dL — ABNORMAL HIGH (ref 70–99)
Potassium: 4.9 mmol/L (ref 3.5–5.1)
Sodium: 142 mmol/L (ref 135–145)

## 2019-08-18 NOTE — Progress Notes (Signed)
[facility] Progress Note     Subjective: CC:  Reports significant improvement in abdominal pain, feels sore this AM. Denies flatus or BM. Her son is at bedside.   Objective: Vital signs in last 24 hours: Temp:  [97.4 F (36.3 C)-98.8 F (37.1 C)] 97.4 F (36.3 C) (06/10 0645) Pulse Rate:  [60-88] 76 (06/10 0732) Resp:  [15-21] 18 (06/10 0645) BP: (82-162)/(45-118) 107/52 (06/10 0732) SpO2:  [89 %-97 %] 95 % (06/10 0645) Weight:  [40 kg] 69 kg (06/09 2215)    Intake/Output from previous day: 06/09 0701 - 06/10 0700 In: 2113.4 [I.V.:975.2; IV Piggyback:1138.2] Out: 1600 [Urine:900; Emesis/NG output:700] Intake/Output this shift: No intake/output data recorded.  PE: Gen:  Alert, NAD, pleasant HEENT: anicteric sclerae, some erythema inferior to L eye Card:  Regular rate and rhythm, pedal pulses 2+ BL Pulm:  Normal effort, clear to auscultation bilaterally Abd: Soft, mild global tenderness without guarding, mild distention, +BS   NG - 700 cc/24h bilious, brown  Skin: warm and dry, no rashes  Psych: A&Ox3   Lab Results:  Recent Labs    08/17/19 0641 08/18/19 0552  WBC 20.4* 16.6*  HGB 14.4 13.6  HCT 45.8 43.1  PLT 282 270   BMET Recent Labs    08/17/19 0641 08/18/19 0552  NA 142 142  K 5.2* 4.9  CL 105 108  CO2 24 22  GLUCOSE 114* 123*  BUN 30* 33*  CREATININE 1.16* 1.40*  CALCIUM 8.6* 8.1*   PT/INR No results for input(s): LABPROT, INR in the last 72 hours. CMP     Component Value Date/Time   NA 142 08/18/2019 0552   K 4.9 08/18/2019 0552   CL 108 08/18/2019 0552   CO2 22 08/18/2019 0552   GLUCOSE 123 (H) 08/18/2019 0552   BUN 33 (H) 08/18/2019 0552   CREATININE 1.40 (H) 08/18/2019 0552   CALCIUM 8.1 (L) 08/18/2019 0552   PROT 6.1 (L) 08/16/2019 2332   ALBUMIN 3.4 (L) 08/16/2019 2332   AST 20 08/16/2019 2332   ALT 14 08/16/2019 2332   ALKPHOS 37 (L) 08/16/2019 2332   BILITOT 0.6 08/16/2019 2332   GFRNONAA 34 (L) 08/18/2019 0552    GFRAA 40 (L) 08/18/2019 0552   Lipase     Component Value Date/Time   LIPASE 48 08/16/2019 2332       Studies/Results: CT Abdomen Pelvis W Contrast  Result Date: 08/17/2019 CLINICAL DATA:  Vomiting.  Abdominal pain. EXAM: CT ABDOMEN AND PELVIS WITH CONTRAST TECHNIQUE: Multidetector CT imaging of the abdomen and pelvis was performed using the standard protocol following bolus administration of intravenous contrast. CONTRAST:  37mL OMNIPAQUE IOHEXOL 300 MG/ML  SOLN COMPARISON:  September 06, 2013 FINDINGS: Lower chest: The lung bases are clear. The heart is enlarged. Hepatobiliary: The liver is normal. Status post cholecystectomy.There is no biliary ductal dilation. Pancreas: Normal contours without ductal dilatation. No peripancreatic fluid collection. Spleen: Unremarkable. Adrenals/Urinary Tract: --Adrenal glands: Unremarkable. --Right kidney/ureter: No hydronephrosis or radiopaque kidney stones. --Left kidney/ureter: No hydronephrosis or radiopaque kidney stones. --Urinary bladder: Unremarkable. Stomach/Bowel: --Stomach/Duodenum: No hiatal hernia or other gastric abnormality. Normal duodenal course and caliber. --Small bowel: There are dilated loops of small bowel in the low midline abdomen. There is a transition point in the mid abdomen (axial series 2, image 43). There is no pneumatosis. There is no free air. The distal small bowel is decompressed. --Colon: Unremarkable. --Appendix: Not visualized. No right lower quadrant inflammation or free fluid. Vascular/Lymphatic: Atherosclerotic calcification is present  within the non-aneurysmal abdominal aorta, without hemodynamically significant stenosis. --No retroperitoneal lymphadenopathy. --No mesenteric lymphadenopathy. --No pelvic or inguinal lymphadenopathy. Reproductive: Unremarkable Other: There is a small amount of free fluid in the patient's abdomen and pelvis. Mesenteric edema is noted. Musculoskeletal. No acute displaced fractures. IMPRESSION: 1.  Small bowel obstruction with transition point in the mid abdomen. 2. Small amount of free fluid in the patient's abdomen and pelvis, likely reactive. No free air. Aortic Atherosclerosis (ICD10-I70.0). Electronically Signed   By: Katherine Mantle M.D.   On: 08/17/2019 02:24   DG Chest Port 1 View  Result Date: 08/17/2019 CLINICAL DATA:  Abdominal pain EXAM: PORTABLE CHEST 1 VIEW COMPARISON:  None. FINDINGS: The heart size and mediastinal contours are within normal limits. Both lungs are clear. The visualized skeletal structures are unremarkable. IMPRESSION: No active disease. Electronically Signed   By: Deatra Robinson M.D.   On: 08/17/2019 00:08   DG Abd Portable 1V  Result Date: 08/18/2019 CLINICAL DATA:  Small-bowel obstruction, follow-up EXAM: PORTABLE ABDOMEN - 1 VIEW COMPARISON:  Portable exam 0744 hours compared to 08/17/2019 FINDINGS: Decreased small bowel distension since prior exam. Few contrast filled loops of minimally prominent small bowel noted in mid abdomen. Scattered gas and stool in nondistended colon. Excreted contrast material in rectum. Atherosclerotic calcifications aorta and iliac arteries. Degenerative changes lumbar spine and BILATERAL hip joints with osseous demineralization. IMPRESSION: Decreased small bowel distension since 08/17/2019. Electronically Signed   By: Ulyses Southward M.D.   On: 08/18/2019 10:46   DG Abd Portable 1V-Small Bowel Obstruction Protocol-initial, 8 hr delay  Result Date: 08/17/2019 CLINICAL DATA:  Evaluate for small bowel obstruction. EXAM: PORTABLE ABDOMEN - 1 VIEW COMPARISON:  August 17, 2019 (1:35 p.m.) FINDINGS: A nasogastric tube is seen with its distal tip overlying the expected region of the gastric antrum. Multiple dilated small bowel loops are again seen within the mid and lower abdomen. These small bowel loops are unchanged in caliber when compared to the prior study. There is no evidence of free air. No radio-opaque calculi are seen. Radiopaque contrast  is noted within the urinary bladder. IMPRESSION: 1. Nasogastric tube positioning, as described above. 2. Stable small bowel obstruction. Electronically Signed   By: Aram Candela M.D.   On: 08/17/2019 22:52   DG Abd Portable 1V  Result Date: 08/17/2019 CLINICAL DATA:  NG tube placement EXAM: PORTABLE ABDOMEN - 1 VIEW COMPARISON:  08/17/2019 FINDINGS: NG tube extends below the hemidiaphragms into the distal stomach. Stomach appears collapsed. Small hiatal hernia noted. Nonspecific gaseous distention of the colon. No obstruction pattern. Degenerative changes of the spine. Aortoiliac atherosclerosis noted. Basilar scarring/atelectasis present. IMPRESSION: NG tube distal stomach. Electronically Signed   By: Judie Petit.  Shick M.D.   On: 08/17/2019 14:03   DG Abd Portable 1V-Small Bowel Protocol-Position Verification  Result Date: 08/17/2019 CLINICAL DATA:  Nasogastric tube insertion EXAM: PORTABLE ABDOMEN - 1 VIEW COMPARISON:  CT from earlier today FINDINGS: The nasogastric tube loops at the level of the diaphragm, based on prior CT likely looping in a hiatal hernia. Small bowel obstruction with affected loops essentially not covered on this study. Cardiomegaly. Visualized lungs are clear IMPRESSION: The nasogastric tube loops near the diaphragm, suspect it is within a small hiatal hernia based on prior CT. Electronically Signed   By: Marnee Spring M.D.   On: 08/17/2019 11:48    Anti-infectives: Anti-infectives (From admission, onward)   None     Assessment/Plan HTN HLD Hypothyroidism GERD PUD Leukocytosis - 16 from 20  AKI - Cr. 1.4 from 1.16  SBO -CT abd pelvis 6/8 w/ transition point in the mid abdomen, small amt free fluid, no free air. - SB protocol 6/9 >> KUB this AM with contrast and air in the colon, decrease in SB distention  - NG 700 cc/24h  - continue NG to LIWS and await return of bowel function, based on above KUB I am hopeful the patient will clinically improve without operative  intervention    LOS: 1 day    Obie Dredge, [facility] Please see Amion for pager number during day hours 7:00am-4:30pm

## 2019-08-18 NOTE — Progress Notes (Signed)
Pt reported unable to void. Has no discomfort, numbness, or pain in lower abdomen or pelvis.    Removed 275 ml with intermitted cath after blatter scan showed 294.

## 2019-08-18 NOTE — Progress Notes (Signed)
Patient is up to chair. Asked to sit in chair for an hour before attempting to walk because she feels weak right now.

## 2019-08-18 NOTE — Progress Notes (Signed)
PROGRESS NOTE    Pamela Dunn  OMV:[id number] DOB: 24-Jun-1931 DOA: 08/16/2019 PCP: Pearson Grippe, MD   Brief Narrative: Pamela Dunn is a 86 y.o. female with a history of cardiomyopathy, rheumatoid arthritis, hypothyroidism. Patient presents secondary to abdominal pain and found to have a small bowel obstruction with transition point. General surgery consulted for co-management.    Assessment & Plan:   Principal Problem:   SBO (small bowel obstruction) (HCC) Active Problems:   Rheumatoid arthritis (HCC)   Small bowel obstruction CT scan significant for SBO with transition point in the mid abdomen. General surgery consulted. Symptoms improving with NG tube -General surgery recommendations: NG tube with attempt at non-operative management -Encouraged ambulation  Hypotension Unsure of etiology. No worsening symptoms. Improved with IV fluid bolus -Continue maintenance IV fluids  Rheumatoid arthritis On prednisone 5 mg daily as an outpatient. Started on Solu-medrol IV secondary to NPO status -Continue Solu-medrol IV  Hypothyroidism -Continue Synthroid  Hyperkalemia Mild. Likely in setting of mild creatinine elevation.  Elevated creatinine Baseline creatinine of 1.1. mildly elevated to 1.24 on admission and trended down with IV fluids.  History of non-ischemic cardiomyopathy Chronic combined systolic and diastolic heart failure EF of 40-45% from 4/8 with associated grade I diastolic heart dysfunction. Patient is not on diuretic therapy as an outpatient.  Leukocytosis WBC of 14.5K on admission with worsening to 20.4K in setting of IV solumedrol. Trended down today.   Hypertension Patient is on Bidil, Toprol-XL as an outpatient. BP mostly controlled inpatient. -Continue Labetalol prn  Hyperlipidemia On Zetia and Coenzyme Q10 as an outpatient.   DVT prophylaxis: SCDs Code Status:   Code Status: Full Code Family Communication: Son at bedside Disposition Plan: Discharge  likely home pending progression of SBO. Likely several days.   Consultants:   General surgery  Procedures:   None  Antimicrobials:  None    Subjective: Abdominal pain and nausea improved. She reports not passing any gas.  Objective: Vitals:   08/18/19 0333 08/18/19 0336 08/18/19 0645 08/18/19 0732  BP: 118/70 118/70 (!) 135/118 (!) 107/52  Pulse: 88 86 78 76  Resp: 18 18 18    Temp: 98.2 F (36.8 C) 98.2 F (36.8 C) (!) 97.4 F (36.3 C)   TempSrc: Oral Oral Oral   SpO2:  97% 95%   Weight:      Height:        Intake/Output Summary (Last 24 hours) at 08/18/2019 1328 Last data filed at 08/18/2019 0700 Gross per 24 hour  Intake 2013.24 ml  Output 1600 ml  Net 413.24 ml   Filed Weights   08/17/19 2215  Weight: 69 kg    Examination:  General exam: Appears calm and comfortable Respiratory system: Clear to auscultation. Respiratory effort normal. Cardiovascular system: S1 & S2 heard, RRR. No murmurs, rubs, gallops or clicks. Gastrointestinal system: Abdomen is mildly distended, soft and nontender. Normal bowel sounds heard. Central nervous system: Alert and oriented. No focal neurological deficits. Musculoskeletal: No edema. No calf tenderness Skin: No cyanosis. No rashes Psychiatry: Judgement and insight appear normal. Mood & affect appropriate.      Data Reviewed: I have personally reviewed following labs and imaging studies  CBC Lab Results  Component Value Date   WBC 16.6 (H) 08/18/2019   RBC 4.27 08/18/2019   HGB 13.6 08/18/2019   HCT 43.1 08/18/2019   MCV 100.9 (H) 08/18/2019   MCH 31.9 08/18/2019   PLT 270 08/18/2019   MCHC 31.6 08/18/2019   RDW 15.7 (H)  08/18/2019   LYMPHSABS 2.3 08/16/2019   MONOABS 1.0 08/16/2019   EOSABS 0.2 08/16/2019   BASOSABS 0.1 08/16/2019     Last metabolic panel Lab Results  Component Value Date   NA 142 08/18/2019   K 4.9 08/18/2019   CL 108 08/18/2019   CO2 22 08/18/2019   BUN 33 (H) 08/18/2019    CREATININE 1.40 (H) 08/18/2019   GLUCOSE 123 (H) 08/18/2019   GFRNONAA 34 (L) 08/18/2019   GFRAA 40 (L) 08/18/2019   CALCIUM 8.1 (L) 08/18/2019   PROT 6.1 (L) 08/16/2019   ALBUMIN 3.4 (L) 08/16/2019   BILITOT 0.6 08/16/2019   ALKPHOS 37 (L) 08/16/2019   AST 20 08/16/2019   ALT 14 08/16/2019   ANIONGAP 12 08/18/2019    CBG (last 3)  Recent Labs    08/17/19 0759 08/17/19 1640 08/17/19 2324  GLUCAP 134* 140* 116*     GFR: Estimated Creatinine Clearance: 25.5 mL/min (A) (by C-G formula based on SCr of 1.4 mg/dL (H)).  Coagulation Profile: No results for input(s): INR, PROTIME in the last 168 hours.  Recent Results (from the past 240 hour(s))  SARS Coronavirus 2 by RT PCR (hospital order, performed in [facility] hospital lab) Nasopharyngeal Nasopharyngeal Swab     Status: None   Collection Time: 08/17/19  3:00 AM   Specimen: Nasopharyngeal Swab  Result Value Ref Range Status   SARS Coronavirus 2 NEGATIVE NEGATIVE Final    Comment: (NOTE) SARS-CoV-2 target nucleic acids are NOT DETECTED. The SARS-CoV-2 RNA is generally detectable in upper and lower respiratory specimens during the acute phase of infection. The lowest concentration of SARS-CoV-2 viral copies this assay can detect is 250 copies / mL. A negative result does not preclude SARS-CoV-2 infection and should not be used as the sole basis for treatment or other patient management decisions.  A negative result may occur with improper specimen collection / handling, submission of specimen other than nasopharyngeal swab, presence of viral mutation(s) within the areas targeted by this assay, and inadequate number of viral copies (<250 copies / mL). A negative result must be combined with clinical observations, patient history, and epidemiological information. Fact Sheet for Patients:   BoilerBrush.com.cy Fact Sheet for Healthcare Providers: https://pope.com/ This test is  not yet approved or cleared  by the [place] FDA and has been authorized for detection and/or diagnosis of SARS-CoV-2 by FDA under an Emergency Use Authorization (EUA).  This EUA will remain in effect (meaning this test can be used) for the duration of the COVID-19 declaration under Section 564(b)(1) of the Act, 21 U.S.C. section 360bbb-3(b)(1), unless the authorization is terminated or revoked sooner. Performed at [facility], [address], [place], [place] [address]         Radiology Studies: CT Abdomen Pelvis W Contrast  Result Date: 08/17/2019 CLINICAL DATA:  Vomiting.  Abdominal pain. EXAM: CT ABDOMEN AND PELVIS WITH CONTRAST TECHNIQUE: Multidetector CT imaging of the abdomen and pelvis was performed using the standard protocol following bolus administration of intravenous contrast. CONTRAST:  75mL OMNIPAQUE IOHEXOL 300 MG/ML  SOLN COMPARISON:  September 06, 2013 FINDINGS: Lower chest: The lung bases are clear. The heart is enlarged. Hepatobiliary: The liver is normal. Status post cholecystectomy.There is no biliary ductal dilation. Pancreas: Normal contours without ductal dilatation. No peripancreatic fluid collection. Spleen: Unremarkable. Adrenals/Urinary Tract: --Adrenal glands: Unremarkable. --Right kidney/ureter: No hydronephrosis or radiopaque kidney stones. --Left kidney/ureter: No hydronephrosis or radiopaque kidney stones. --Urinary bladder: Unremarkable. Stomach/Bowel: --Stomach/Duodenum: No hiatal  hernia or other gastric abnormality. Normal duodenal course and caliber. --Small bowel: There are dilated loops of small bowel in the low midline abdomen. There is a transition point in the mid abdomen (axial series 2, image 43). There is no pneumatosis. There is no free air. The distal small bowel is decompressed. --Colon: Unremarkable. --Appendix: Not visualized. No right lower quadrant inflammation or free fluid. Vascular/Lymphatic: Atherosclerotic calcification is  present within the non-aneurysmal abdominal aorta, without hemodynamically significant stenosis. --No retroperitoneal lymphadenopathy. --No mesenteric lymphadenopathy. --No pelvic or inguinal lymphadenopathy. Reproductive: Unremarkable Other: There is a small amount of free fluid in the patient's abdomen and pelvis. Mesenteric edema is noted. Musculoskeletal. No acute displaced fractures. IMPRESSION: 1. Small bowel obstruction with transition point in the mid abdomen. 2. Small amount of free fluid in the patient's abdomen and pelvis, likely reactive. No free air. Aortic Atherosclerosis (ICD10-I70.0). Electronically Signed   By: Constance Holster M.D.   On: 08/17/2019 02:24   DG Chest Port 1 View  Result Date: 08/17/2019 CLINICAL DATA:  Abdominal pain EXAM: PORTABLE CHEST 1 VIEW COMPARISON:  None. FINDINGS: The heart size and mediastinal contours are within normal limits. Both lungs are clear. The visualized skeletal structures are unremarkable. IMPRESSION: No active disease. Electronically Signed   By: Ulyses Jarred M.D.   On: 08/17/2019 00:08   DG Abd Portable 1V  Result Date: 08/18/2019 CLINICAL DATA:  Small-bowel obstruction, follow-up EXAM: PORTABLE ABDOMEN - 1 VIEW COMPARISON:  Portable exam 0744 hours compared to 08/17/2019 FINDINGS: Decreased small bowel distension since prior exam. Few contrast filled loops of minimally prominent small bowel noted in mid abdomen. Scattered gas and stool in nondistended colon. Excreted contrast material in rectum. Atherosclerotic calcifications aorta and iliac arteries. Degenerative changes lumbar spine and BILATERAL hip joints with osseous demineralization. IMPRESSION: Decreased small bowel distension since 08/17/2019. Electronically Signed   By: Lavonia Dana M.D.   On: 08/18/2019 10:46   DG Abd Portable 1V-Small Bowel Obstruction Protocol-initial, 8 hr delay  Result Date: 08/17/2019 CLINICAL DATA:  Evaluate for small bowel obstruction. EXAM: PORTABLE ABDOMEN - 1  VIEW COMPARISON:  August 17, 2019 (1:35 p.m.) FINDINGS: A nasogastric tube is seen with its distal tip overlying the expected region of the gastric antrum. Multiple dilated small bowel loops are again seen within the mid and lower abdomen. These small bowel loops are unchanged in caliber when compared to the prior study. There is no evidence of free air. No radio-opaque calculi are seen. Radiopaque contrast is noted within the urinary bladder. IMPRESSION: 1. Nasogastric tube positioning, as described above. 2. Stable small bowel obstruction. Electronically Signed   By: Virgina Norfolk M.D.   On: 08/17/2019 22:52   DG Abd Portable 1V  Result Date: 08/17/2019 CLINICAL DATA:  NG tube placement EXAM: PORTABLE ABDOMEN - 1 VIEW COMPARISON:  08/17/2019 FINDINGS: NG tube extends below the hemidiaphragms into the distal stomach. Stomach appears collapsed. Small hiatal hernia noted. Nonspecific gaseous distention of the colon. No obstruction pattern. Degenerative changes of the spine. Aortoiliac atherosclerosis noted. Basilar scarring/atelectasis present. IMPRESSION: NG tube distal stomach. Electronically Signed   By: Jerilynn Mages.  Shick M.D.   On: 08/17/2019 14:03   DG Abd Portable 1V-Small Bowel Protocol-Position Verification  Result Date: 08/17/2019 CLINICAL DATA:  Nasogastric tube insertion EXAM: PORTABLE ABDOMEN - 1 VIEW COMPARISON:  CT from earlier today FINDINGS: The nasogastric tube loops at the level of the diaphragm, based on prior CT likely looping in a hiatal hernia. Small bowel obstruction with affected loops  essentially not covered on this study. Cardiomegaly. Visualized lungs are clear IMPRESSION: The nasogastric tube loops near the diaphragm, suspect it is within a small hiatal hernia based on prior CT. Electronically Signed   By: Marnee Spring M.D.   On: 08/17/2019 11:48        Scheduled Meds: . levothyroxine  12.5 mcg Intravenous Daily  . methylPREDNISolone (SOLU-MEDROL) injection  20 mg Intravenous  Daily   Continuous Infusions: . dextrose 5 % and 0.9% NaCl 75 mL/hr at 08/18/19 1003     LOS: 1 day     Jacquelin Hawking, MD [facility] 08/18/2019, 1:28 PM  If 7PM-7AM, please contact night-coverage [email or web address]

## 2019-08-18 NOTE — Plan of Care (Signed)
  Problem: Clinical Measurements: Goal: Will remain free from infection Outcome: Progressing Goal: Diagnostic test results will improve Outcome: Progressing   Problem: Elimination: Goal: Will not experience complications related to bowel motility Outcome: Progressing Goal: Will not experience complications related to urinary retention Outcome: Progressing   Problem: Pain Managment: Goal: General experience of comfort will improve Outcome: Progressing   Problem: Safety: Goal: Ability to remain free from injury will improve Outcome: Progressing

## 2019-08-19 LAB — GLUCOSE, CAPILLARY
Glucose-Capillary: 111 mg/dL — ABNORMAL HIGH (ref 70–99)
Glucose-Capillary: 139 mg/dL — ABNORMAL HIGH (ref 70–99)

## 2019-08-19 MED ORDER — PANTOPRAZOLE SODIUM 40 MG IV SOLR
40.0000 mg | Freq: Every day | INTRAVENOUS | Status: DC
  Administered 2019-08-19 – 2019-08-31 (×13): 40 mg via INTRAVENOUS
  Filled 2019-08-19 (×14): qty 40

## 2019-08-19 MED ORDER — LIP MEDEX EX OINT
TOPICAL_OINTMENT | CUTANEOUS | Status: DC | PRN
  Filled 2019-08-19: qty 7

## 2019-08-19 NOTE — Progress Notes (Signed)
Patient ID: Pamela Dunn, female   DOB: 07/19/1931, 86 y.o.   MRN: [id number]   Acute Care Surgery Service Progress Note:    Chief Complaint/Subjective: Reports min abd pain Reports several episodes of flatus this am Wants to have some liquids. Not nauseous  Objective: Vital signs in last 24 hours: Temp:  [97.5 F (36.4 C)-99.1 F (37.3 C)] 97.6 F (36.4 C) (06/11 0445) Pulse Rate:  [89-101] 101 (06/11 0445) Resp:  [18-20] 18 (06/11 0445) BP: (132-147)/(62-73) 147/73 (06/11 0445) SpO2:  [93 %-100 %] 100 % (06/11 0445)    Intake/Output from previous day: 06/10 0701 - 06/11 0700 In: -  Out: 850 [Urine:525; Emesis/NG output:325] Intake/Output this shift: Total I/O In: -  Out: 550 [Emesis/NG output:550]  Lungs:  nonlabored  Cardiovascular: reg  Abd: soft, essentially nontender, central truncal obesity  Extremities: no edema, +SCDs  Neuro: alert, nonfocal  Lab Results: CBC  Recent Labs    08/17/19 0641 08/18/19 0552  WBC 20.4* 16.6*  HGB 14.4 13.6  HCT 45.8 43.1  PLT 282 270   BMET Recent Labs    08/17/19 0641 08/18/19 0552  NA 142 142  K 5.2* 4.9  CL 105 108  CO2 24 22  GLUCOSE 114* 123*  BUN 30* 33*  CREATININE 1.16* 1.40*  CALCIUM 8.6* 8.1*   LFT Hepatic Function Latest Ref Rng & Units 08/16/2019 09/05/2013  Total Protein 6.5 - 8.1 g/dL 6.1(L) 7.1  Albumin 3.5 - 5.0 g/dL 3.4(L) 3.6  AST 15 - 41 U/L 20 25  ALT 0 - 44 U/L 14 15  Alk Phosphatase 38 - 126 U/L 37(L) 54  Total Bilirubin 0.3 - 1.2 mg/dL 0.6 0.6   PT/INR No results for input(s): LABPROT, INR in the last 72 hours. ABG No results for input(s): PHART, HCO3 in the last 72 hours.  Invalid input(s): PCO2, PO2  Studies/Results:  Anti-infectives: Anti-infectives (From admission, onward)   None      Medications: Scheduled Meds: . levothyroxine  12.5 mcg Intravenous Daily  . methylPREDNISolone (SOLU-MEDROL) injection  20 mg Intravenous Daily   Continuous Infusions: . dextrose 5  % and 0.9% NaCl 75 mL/hr at 08/19/19 0314   PRN Meds:.fentaNYL (SUBLIMAZE) injection, labetalol, lip balm, morphine injection, ondansetron **OR** ondansetron (ZOFRAN) IV  Assessment/Plan: Patient Active Problem List   Diagnosis Date Noted  . SBO (small bowel obstruction) (HCC) 08/17/2019  . Multiple pulmonary nodules 06/19/2017  . Metatarsalgia of left foot 07/13/2014  . Hx of adenomatous colonic polyps 09/05/2013  . Spinal stenosis of lumbar region 09/05/2013  . Lumbar disc disease 09/05/2013  . HYPERLIPIDEMIA 10/23/2009  . GERD 10/23/2009  . Rheumatoid arthritis (HCC) 10/23/2009  . ABDOMINAL PAIN, UNSPECIFIED SITE 10/23/2009  . ANEMIA, HX OF 10/23/2009  . PEPTIC ULCER DISEASE, HX OF 10/23/2009   HTN HLD Hypothyroidism GERD PUD Leukocytosis - 16 from 20 AKI - Cr. 1.4 from 1.16  SBO -CT abd pelvis 6/8 w/ transition point in the mid abdomen, small amt free fluid, no free air. - SB protocol 6/9 >> KUB this AM with contrast and air in the colon, decrease in SB distention  - xray 6/10 showed decreased sb distension, contrast in small bowel, gas/stool and contrast in colon - NG 900 cc/24h  -since clinically now has flatus with improvement in pain and improved bowel gas pattern,  Will clamp NG and allow clears  ?UTI - f/u urine cx  Disposition:  LOS: 2 days     M. , MD, FACS General,   Bariatric, & Minimally Invasive Surgery [phone] [facility]

## 2019-08-19 NOTE — Progress Notes (Signed)
Pt with N/V and increased pain with just sips of ginger ale this afternoon. Pt placed back on low-intermittent wall suction. Nighttime Provider made aware.

## 2019-08-19 NOTE — Progress Notes (Signed)
Patient up to [facility] before ambulating.  Unable to void.  Was unable to void last night as well, had I&O cath done around 3:30 am per patient.  Bladder scan reveals 250cc at this time - patient feels urge to void.  I&O cath completed again with 450cc resulted.

## 2019-08-19 NOTE — Progress Notes (Signed)
PROGRESS NOTE    [name]  XAJ:[id number] DOB: Aug 08, 1931 DOA: 08/16/2019 PCP: Pearson Grippe, MD   Brief Narrative: Pamela Dunn is a 86 y.o. female with a history of cardiomyopathy, rheumatoid arthritis, hypothyroidism. Patient presents secondary to abdominal pain and found to have a small bowel obstruction with transition point. General surgery consulted for co-management.    Assessment & Plan:   Principal Problem:   SBO (small bowel obstruction) (HCC) Active Problems:   GERD   Rheumatoid arthritis (HCC)   Small bowel obstruction CT scan significant for SBO with transition point in the mid abdomen. General surgery consulted. Symptoms improving with NG tube -General surgery recommendations: NG tube with attempt at non-operative management -Encourage ambulation  Hypotension Unsure of etiology. No worsening symptoms. Improved with IV fluid bolus -Continue maintenance IV fluids while NPO  Rheumatoid arthritis On prednisone 5 mg daily as an outpatient. Started on Solu-medrol IV secondary to NPO status -Continue Solu-medrol IV while NPO  Hypothyroidism -Continue Synthroid  Hyperkalemia Mild. Likely in setting of mild creatinine elevation.  Elevated creatinine Baseline creatinine of 1.1. mildly elevated to 1.24 on admission and trended down with IV fluids.  History of non-ischemic cardiomyopathy Chronic combined systolic and diastolic heart failure EF of 40-45% from 4/8 with associated grade I diastolic heart dysfunction. Patient is not on diuretic therapy as an outpatient.  Leukocytosis WBC of 14.5K on admission with worsening to 20.4K in setting of IV solumedrol. Trended down today.  Urinary retention Patient has required multiple In/Out catheterizations. No urge to urinate. -Check urine culture  Hypertension Patient is on Bidil, Toprol-XL as an outpatient. BP mostly controlled inpatient. -Continue Labetalol prn  Hyperlipidemia On Zetia and Coenzyme Q10 as an  outpatient.   DVT prophylaxis: SCDs Code Status:   Code Status: Full Code Family Communication: None at bedside Disposition Plan: Discharge likely home pending progression of SBO. Likely several days.   Consultants:   General surgery  Procedures:   NG tube placement  Antimicrobials:  None   Subjective: Continues to improve. No bowel movement.  Objective: Vitals:   08/18/19 0732 08/18/19 1347 08/18/19 2101 08/19/19 0445  BP: (!) 107/52 132/62 (!) 145/63 (!) 147/73  Pulse: 76 89 93 (!) 101  Resp:  20 18 18   Temp:  99.1 F (37.3 C) (!) 97.5 F (36.4 C) 97.6 F (36.4 C)  TempSrc:  Oral Oral Oral  SpO2:  93% 97% 100%  Weight:      Height:        Intake/Output Summary (Last 24 hours) at 08/19/2019 0922 Last data filed at 08/19/2019 0810 Gross per 24 hour  Intake --  Output 1400 ml  Net -1400 ml   Filed Weights   08/17/19 2215  Weight: 69 kg    Examination:  General exam: Appears calm and comfortable Respiratory system: Clear to auscultation. Respiratory effort normal. Cardiovascular system: S1 & S2 heard, RRR. No murmurs, rubs, gallops or clicks. Gastrointestinal system: Abdomen is mildly distended, soft and nontender. Normal bowel sounds heard. Central nervous system: Alert and oriented. No focal neurological deficits. Musculoskeletal: No edema. No calf tenderness Skin: No cyanosis. No rashes Psychiatry: Judgement and insight appear normal. Mood & affect appropriate.      Data Reviewed: I have personally reviewed following labs and imaging studies  CBC Lab Results  Component Value Date   WBC 16.6 (H) 08/18/2019   RBC 4.27 08/18/2019   HGB 13.6 08/18/2019   HCT 43.1 08/18/2019   MCV 100.9 (H) 08/18/2019  MCH 31.9 08/18/2019   PLT 270 08/18/2019   MCHC 31.6 08/18/2019   RDW 15.7 (H) 08/18/2019   LYMPHSABS 2.3 08/16/2019   MONOABS 1.0 08/16/2019   EOSABS 0.2 08/16/2019   BASOSABS 0.1 16/38/4536     Last metabolic panel Lab Results   Component Value Date   NA 142 08/18/2019   K 4.9 08/18/2019   CL 108 08/18/2019   CO2 22 08/18/2019   BUN 33 (H) 08/18/2019   CREATININE 1.40 (H) 08/18/2019   GLUCOSE 123 (H) 08/18/2019   GFRNONAA 34 (L) 08/18/2019   GFRAA 40 (L) 08/18/2019   CALCIUM 8.1 (L) 08/18/2019   PROT 6.1 (L) 08/16/2019   ALBUMIN 3.4 (L) 08/16/2019   BILITOT 0.6 08/16/2019   ALKPHOS 37 (L) 08/16/2019   AST 20 08/16/2019   ALT 14 08/16/2019   ANIONGAP 12 08/18/2019    CBG (last 3)  Recent Labs    08/18/19 1630 08/18/19 2102 08/19/19 0731  GLUCAP 114* 108* 111*     GFR: Estimated Creatinine Clearance: 25.5 mL/min (A) (by C-G formula based on SCr of 1.4 mg/dL (H)).  Coagulation Profile: No results for input(s): INR, PROTIME in the last 168 hours.  Recent Results (from the past 240 hour(s))  SARS Coronavirus 2 by RT PCR (hospital order, performed in [facility] hospital lab) Nasopharyngeal Nasopharyngeal Swab     Status: None   Collection Time: 08/17/19  3:00 AM   Specimen: Nasopharyngeal Swab  Result Value Ref Range Status   SARS Coronavirus 2 NEGATIVE NEGATIVE Final    Comment: (NOTE) SARS-CoV-2 target nucleic acids are NOT DETECTED. The SARS-CoV-2 RNA is generally detectable in upper and lower respiratory specimens during the acute phase of infection. The lowest concentration of SARS-CoV-2 viral copies this assay can detect is 250 copies / mL. A negative result does not preclude SARS-CoV-2 infection and should not be used as the sole basis for treatment or other patient management decisions.  A negative result may occur with improper specimen collection / handling, submission of specimen other than nasopharyngeal swab, presence of viral mutation(s) within the areas targeted by this assay, and inadequate number of viral copies (<250 copies / mL). A negative result must be combined with clinical observations, patient history, and epidemiological information. Fact Sheet for Patients:    StrictlyIdeas.no Fact Sheet for Healthcare Providers: BankingDealers.co.za This test is not yet approved or cleared  by the [place] FDA and has been authorized for detection and/or diagnosis of SARS-CoV-2 by FDA under an Emergency Use Authorization (EUA).  This EUA will remain in effect (meaning this test can be used) for the duration of the COVID-19 declaration under Section 564(b)(1) of the Act, 21 U.S.C. section 360bbb-3(b)(1), unless the authorization is terminated or revoked sooner. Performed at [facility], [place] [address] [place], [place] [address]         Radiology Studies: DG Abd Portable 1V  Result Date: 08/18/2019 CLINICAL DATA:  Small-bowel obstruction, follow-up EXAM: PORTABLE ABDOMEN - 1 VIEW COMPARISON:  Portable exam 0744 hours compared to 08/17/2019 FINDINGS: Decreased small bowel distension since prior exam. Few contrast filled loops of minimally prominent small bowel noted in mid abdomen. Scattered gas and stool in nondistended colon. Excreted contrast material in rectum. Atherosclerotic calcifications aorta and iliac arteries. Degenerative changes lumbar spine and BILATERAL hip joints with osseous demineralization. IMPRESSION: Decreased small bowel distension since 08/17/2019. Electronically Signed   By: Lavonia Dana M.D.   On: 08/18/2019 10:46   DG Abd Portable  1V-Small Bowel Obstruction Protocol-initial, 8 hr delay  Result Date: 08/17/2019 CLINICAL DATA:  Evaluate for small bowel obstruction. EXAM: PORTABLE ABDOMEN - 1 VIEW COMPARISON:  August 17, 2019 (1:35 p.m.) FINDINGS: A nasogastric tube is seen with its distal tip overlying the expected region of the gastric antrum. Multiple dilated small bowel loops are again seen within the mid and lower abdomen. These small bowel loops are unchanged in caliber when compared to the prior study. There is no evidence of free air. No radio-opaque calculi are seen.  Radiopaque contrast is noted within the urinary bladder. IMPRESSION: 1. Nasogastric tube positioning, as described above. 2. Stable small bowel obstruction. Electronically Signed   By: Aram Candela M.D.   On: 08/17/2019 22:52   DG Abd Portable 1V  Result Date: 08/17/2019 CLINICAL DATA:  NG tube placement EXAM: PORTABLE ABDOMEN - 1 VIEW COMPARISON:  08/17/2019 FINDINGS: NG tube extends below the hemidiaphragms into the distal stomach. Stomach appears collapsed. Small hiatal hernia noted. Nonspecific gaseous distention of the colon. No obstruction pattern. Degenerative changes of the spine. Aortoiliac atherosclerosis noted. Basilar scarring/atelectasis present. IMPRESSION: NG tube distal stomach. Electronically Signed   By: Judie Petit.  Shick M.D.   On: 08/17/2019 14:03   DG Abd Portable 1V-Small Bowel Protocol-Position Verification  Result Date: 08/17/2019 CLINICAL DATA:  Nasogastric tube insertion EXAM: PORTABLE ABDOMEN - 1 VIEW COMPARISON:  CT from earlier today FINDINGS: The nasogastric tube loops at the level of the diaphragm, based on prior CT likely looping in a hiatal hernia. Small bowel obstruction with affected loops essentially not covered on this study. Cardiomegaly. Visualized lungs are clear IMPRESSION: The nasogastric tube loops near the diaphragm, suspect it is within a small hiatal hernia based on prior CT. Electronically Signed   By: Marnee Spring M.D.   On: 08/17/2019 11:48        Scheduled Meds: . levothyroxine  12.5 mcg Intravenous Daily  . methylPREDNISolone (SOLU-MEDROL) injection  20 mg Intravenous Daily   Continuous Infusions: . dextrose 5 % and 0.9% NaCl 75 mL/hr at 08/19/19 0314     LOS: 2 days     Jacquelin Hawking, MD [facility] 08/19/2019, 9:22 AM  If 7PM-7AM, please contact night-coverage [email or web address]

## 2019-08-19 NOTE — Progress Notes (Signed)
Pt with HTN and noted that patient takes bidil and metoprolol at home and hasn't had in 2 days d/t SBO. Provider made aware of the following. Awaiting orders

## 2019-08-19 NOTE — Progress Notes (Signed)
Patients NGT clamped, canister marked at 800cc.

## 2019-08-19 NOTE — Care Management Important Message (Signed)
Important Message  Patient Details IM Letter given to Lanier Clam RN Case Manager to present to the Patient Name: Pamela Dunn MRN: [id number] Date of Birth: 03/02/1932   Medicare Important Message Given:  Yes     Caren Macadam 08/19/2019, 12:19 PM

## 2019-08-20 ENCOUNTER — Inpatient Hospital Stay (HOSPITAL_COMMUNITY): Payer: Medicare PPO

## 2019-08-20 LAB — CBC
HCT: 33.1 % — ABNORMAL LOW (ref 36.0–46.0)
Hemoglobin: 10.4 g/dL — ABNORMAL LOW (ref 12.0–15.0)
MCH: 32 pg (ref 26.0–34.0)
MCHC: 31.4 g/dL (ref 30.0–36.0)
MCV: 101.8 fL — ABNORMAL HIGH (ref 80.0–100.0)
Platelets: 234 10*3/uL (ref 150–400)
RBC: 3.25 MIL/uL — ABNORMAL LOW (ref 3.87–5.11)
RDW: 15.4 % (ref 11.5–15.5)
WBC: 4.9 10*3/uL (ref 4.0–10.5)
nRBC: 0.4 % — ABNORMAL HIGH (ref 0.0–0.2)

## 2019-08-20 LAB — GLUCOSE, CAPILLARY
Glucose-Capillary: 107 mg/dL — ABNORMAL HIGH (ref 70–99)
Glucose-Capillary: 113 mg/dL — ABNORMAL HIGH (ref 70–99)
Glucose-Capillary: 135 mg/dL — ABNORMAL HIGH (ref 70–99)

## 2019-08-20 LAB — BASIC METABOLIC PANEL
Anion gap: 8 (ref 5–15)
BUN: 27 mg/dL — ABNORMAL HIGH (ref 8–23)
CO2: 24 mmol/L (ref 22–32)
Calcium: 7.7 mg/dL — ABNORMAL LOW (ref 8.9–10.3)
Chloride: 112 mmol/L — ABNORMAL HIGH (ref 98–111)
Creatinine, Ser: 1 mg/dL (ref 0.44–1.00)
GFR calc Af Amer: 59 mL/min — ABNORMAL LOW (ref >60)
GFR calc non Af Amer: 51 mL/min — ABNORMAL LOW (ref >60)
Glucose, Bld: 119 mg/dL — ABNORMAL HIGH (ref 70–99)
Potassium: 4.3 mmol/L (ref 3.5–5.1)
Sodium: 144 mmol/L (ref 135–145)

## 2019-08-20 LAB — MAGNESIUM: Magnesium: 2.6 mg/dL — ABNORMAL HIGH (ref 1.7–2.4)

## 2019-08-20 LAB — URINE CULTURE: Culture: 30000 — AB

## 2019-08-20 LAB — PHOSPHORUS: Phosphorus: 1.8 mg/dL — ABNORMAL LOW (ref 2.5–4.6)

## 2019-08-20 MED ORDER — SODIUM PHOSPHATES 45 MMOLE/15ML IV SOLN
10.0000 mmol | Freq: Once | INTRAVENOUS | Status: AC
  Administered 2019-08-20: 10 mmol via INTRAVENOUS
  Filled 2019-08-20: qty 3.33

## 2019-08-20 MED ORDER — HYDROCORTISONE NA SUCCINATE PF 100 MG IJ SOLR: 20.0000 mg | Freq: Two times a day (BID) | INTRAMUSCULAR | Status: DC

## 2019-08-20 MED ORDER — HYDROCORTISONE NA SUCCINATE PF 100 MG IJ SOLR: 20.0000 mg | Freq: Every day | INTRAMUSCULAR | Status: DC

## 2019-08-20 MED ORDER — CHLORHEXIDINE GLUCONATE CLOTH 2 % EX PADS
6.0000 | MEDICATED_PAD | Freq: Every day | CUTANEOUS | Status: DC
  Administered 2019-08-20 – 2019-09-02 (×14): 6 via TOPICAL

## 2019-08-20 MED ORDER — HYDROCORTISONE NA SUCCINATE PF 100 MG IJ SOLR
20.0000 mg | Freq: Every day | INTRAMUSCULAR | Status: DC
  Administered 2019-08-20 – 2019-08-30 (×11): 20 mg via INTRAVENOUS
  Filled 2019-08-20 (×12): qty 2

## 2019-08-20 NOTE — Progress Notes (Addendum)
Patient ID: Pamela Dunn, female   DOB: [birth year], 86 y.o.   MRN: [id number]   Acute Care Surgery Service Progress Note:    Chief Complaint/Subjective: Reports small volume emesis overnight with clears, NG back on suction. Continues to endorse flatus. Denies pain.   Objective: Vital signs in last 24 hours: Temp:  [97.9 F (36.6 C)-98.3 F (36.8 C)] 98.3 F (36.8 C) (06/12 0508) Pulse Rate:  [88-98] 98 (06/12 0508) Resp:  [18-21] 21 (06/12 0508) BP: (157-180)/(71-83) 159/72 (06/12 0508) SpO2:  [92 %-100 %] 92 % (06/12 0508) Last BM Date: 08/15/19  Intake/Output from previous day: 06/11 0701 - 06/12 0700 In: 3623.8 [P.O.:700; I.V.:2923.8] Out: 1800 [Urine:550; Emesis/NG output:1250] Intake/Output this shift: Total I/O In: 720 [I.V.:720] Out: 550 [Urine:100; Emesis/NG output:450]  Lungs:  nonlabored  Cardiovascular: reg  Abd: soft, minimally diffusely tender, central truncal obesity. NG output bilious.   Extremities: no edema, +SCDs  Neuro: alert, nonfocal  Lab Results: CBC  Recent Labs    08/17/19 0641 08/18/19 0552  WBC 20.4* 16.6*  HGB 14.4 13.6  HCT 45.8 43.1  PLT 282 270   BMET Recent Labs    08/17/19 0641 08/18/19 0552  NA 142 142  K 5.2* 4.9  CL 105 108  CO2 24 22  GLUCOSE 114* 123*  BUN 30* 33*  CREATININE 1.16* 1.40*  CALCIUM 8.6* 8.1*   LFT Hepatic Function Latest Ref Rng & Units 08/16/2019 09/05/2013  Total Protein 6.5 - 8.1 g/dL 6.1(L) 7.1  Albumin 3.5 - 5.0 g/dL 3.4(L) 3.6  AST 15 - 41 U/L 20 25  ALT 0 - 44 U/L 14 15  Alk Phosphatase 38 - 126 U/L 37(L) 54  Total Bilirubin 0.3 - 1.2 mg/dL 0.6 0.6   PT/INR No results for input(s): LABPROT, INR in the last 72 hours. ABG No results for input(s): PHART, HCO3 in the last 72 hours.  Invalid input(s): PCO2, PO2  Studies/Results:  Anti-infectives: Anti-infectives (From admission, onward)   None      Medications: Scheduled Meds: . Chlorhexidine Gluconate Cloth  6 each Topical  Daily  . levothyroxine  12.5 mcg Intravenous Daily  . methylPREDNISolone (SOLU-MEDROL) injection  20 mg Intravenous Daily  . pantoprazole (PROTONIX) IV  40 mg Intravenous QHS   Continuous Infusions: . dextrose 5 % and 0.9% NaCl 75 mL/hr at 08/19/19 2009   PRN Meds:.fentaNYL (SUBLIMAZE) injection, labetalol, lip balm, morphine injection, ondansetron **OR** ondansetron (ZOFRAN) IV  Assessment/Plan: Patient Active Problem List   Diagnosis Date Noted  . SBO (small bowel obstruction) ([place]) 08/17/2019  . Multiple pulmonary nodules 06/19/2017  . Metatarsalgia of left foot 07/13/2014  . Hx of adenomatous colonic polyps 09/05/2013  . Spinal stenosis of lumbar region 09/05/2013  . Lumbar disc disease 09/05/2013  . HYPERLIPIDEMIA 10/23/2009  . GERD 10/23/2009  . Rheumatoid arthritis ([place]) 10/23/2009  . ABDOMINAL PAIN, UNSPECIFIED SITE 10/23/2009  . ANEMIA, HX OF 10/23/2009  . PEPTIC ULCER DISEASE, HX OF 10/23/2009   HTN HLD Hypothyroidism GERD PUD Leukocytosis - 16 from 20, no labs yesterday AKI - Cr. 1.4 from 1.16, no labs yesterday  SBO -CT abd pelvis 6/8 w/ transition point in the mid abdomen, small amt free fluid, no free air. - SB protocol 6/9 >> KUB this AM with contrast and air in the colon, decrease in SB distention  - xray 6/10 showed decreased sb distension, contrast in small bowel, gas/stool and contrast in colon - failed clamping trial yesterday but continues to pass flatus with benign  exam  -Repeat plain films and labs this morning. Continue to monitor, hopefully with more time she will progress but likelihood of needing surgical intervention is increased at this point.   -She should not need high dose steroids! Recommend her normal steroid dose converted to IV. Consider continuing home beta blocker. Will defer to primary service.   ?UTI - f/u urine cx  Disposition:  LOS: 3 days    Clovis Riley  MD, FACS General, Bariatric, & Minimally Invasive Surgery [phone] [facility]

## 2019-08-20 NOTE — Progress Notes (Signed)
PROGRESS NOTE    Pamela Dunn  MHW:[id number] DOB: 1931-05-07 DOA: 08/16/2019 PCP: Pearson Grippe, MD   Brief Narrative: Pamela Dunn is a 86 y.o. female with a history of cardiomyopathy, rheumatoid arthritis, hypothyroidism. Patient presents secondary to abdominal pain and found to have a small bowel obstruction with transition point. General surgery consulted for co-management.    Assessment & Plan:   Principal Problem:   SBO (small bowel obstruction) (HCC) Active Problems:   GERD   Rheumatoid arthritis (HCC)   Small bowel obstruction CT scan significant for SBO with transition point in the mid abdomen. General surgery consulted. Symptoms initially improved with NG tube but had emesis overnight on 6/11 -General surgery recommendations: NG tube. Abdominal x-ray -Encourage ambulation  Hypotension Unsure of etiology. No worsening symptoms. Improved with IV fluid bolus and stress dose steroids. -Continue maintenance IV fluids while NPO  Rheumatoid arthritis On prednisone 5 mg daily as an outpatient. Started on Solu-medrol IV secondary to NPO status -Solu-Cortef IV while NPO  Hypothyroidism -Continue Synthroid  Hyperkalemia Mild. Likely in setting of mild creatinine elevation.  Elevated creatinine Baseline creatinine of 1.1. mildly elevated to 1.24 on admission and trended down with IV fluids.  History of non-ischemic cardiomyopathy Chronic combined systolic and diastolic heart failure EF of 40-45% from 4/8 with associated grade I diastolic heart dysfunction. Patient is not on diuretic therapy as an outpatient.  Leukocytosis WBC of 14.5K on admission with worsening to 20.4K in setting of IV solumedrol. Trended down and now resolved.  Urinary retention Patient has required multiple In/Out catheterizations. No urge to urinate. -Urine culture pending  Hypertension Patient is on Bidil, Toprol-XL as an outpatient. BP mostly controlled inpatient. -Continue Labetalol  prn  Hyperlipidemia On Zetia and Coenzyme Q10 as an outpatient.   DVT prophylaxis: SCDs Code Status:   Code Status: Full Code Family Communication: Daughter in law at bedside Disposition Plan: Discharge likely home pending progression of SBO. Likely several days.   Consultants:   General surgery  Procedures:   NG tube placement  Antimicrobials:  None   Subjective: Vomited last night. Abdominal pain improved. Passed gas.  Objective: Vitals:   08/19/19 0830 08/19/19 1400 08/19/19 2047 08/20/19 0508  BP: (!) 157/74 (!) 167/83 (!) 180/71 (!) 159/72  Pulse: 88 95 96 98  Resp: 18 18 20  (!) 21  Temp: 98.3 F (36.8 C) 97.9 F (36.6 C) 98.2 F (36.8 C) 98.3 F (36.8 C)  TempSrc: Oral Oral Oral Oral  SpO2: 100% 95% 96% 92%  Weight:      Height:        Intake/Output Summary (Last 24 hours) at 08/20/2019 0720 Last data filed at 08/20/2019 0714 Gross per 24 hour  Intake 3623.8 ml  Output 2150 ml  Net 1473.8 ml   Filed Weights   08/17/19 2215  Weight: 69 kg    Examination:  General exam: Appears calm and comfortable Respiratory system: Clear to auscultation. Respiratory effort normal. Cardiovascular system: S1 & S2 heard, RRR. Gastrointestinal system: Abdomen is slightly distended, soft and nontender. No organomegaly or masses felt. Normal bowel sounds heard. Central nervous system: Alert and oriented. No focal neurological deficits. Musculoskeletal: Some upper/lower extremity edema. No calf tenderness Skin: No cyanosis. No rashes Psychiatry: Judgement and insight appear normal. Mood & affect appropriate.    Data Reviewed: I have personally reviewed following labs and imaging studies  CBC Lab Results  Component Value Date   WBC 4.9 08/20/2019   RBC 3.25 (L) 08/20/2019  HGB 10.4 (L) 08/20/2019   HCT 33.1 (L) 08/20/2019   MCV 101.8 (H) 08/20/2019   MCH 32.0 08/20/2019   PLT 234 08/20/2019   MCHC 31.4 08/20/2019   RDW 15.4 08/20/2019   LYMPHSABS 2.3  08/16/2019   MONOABS 1.0 08/16/2019   EOSABS 0.2 08/16/2019   BASOSABS 0.1 96/22/2979     Last metabolic panel Lab Results  Component Value Date   NA 142 08/18/2019   K 4.9 08/18/2019   CL 108 08/18/2019   CO2 22 08/18/2019   BUN 33 (H) 08/18/2019   CREATININE 1.40 (H) 08/18/2019   GLUCOSE 123 (H) 08/18/2019   GFRNONAA 34 (L) 08/18/2019   GFRAA 40 (L) 08/18/2019   CALCIUM 8.1 (L) 08/18/2019   PROT 6.1 (L) 08/16/2019   ALBUMIN 3.4 (L) 08/16/2019   BILITOT 0.6 08/16/2019   ALKPHOS 37 (L) 08/16/2019   AST 20 08/16/2019   ALT 14 08/16/2019   ANIONGAP 12 08/18/2019    CBG (last 3)  Recent Labs    08/19/19 0731 08/19/19 1557 08/20/19 0007  GLUCAP 111* 139* 135*     GFR: Estimated Creatinine Clearance: 25.5 mL/min (A) (by C-G formula based on SCr of 1.4 mg/dL (H)).  Coagulation Profile: No results for input(s): INR, PROTIME in the last 168 hours.  Recent Results (from the past 240 hour(s))  SARS Coronavirus 2 by RT PCR (hospital order, performed in [facility] hospital lab) Nasopharyngeal Nasopharyngeal Swab     Status: None   Collection Time: 08/17/19  3:00 AM   Specimen: Nasopharyngeal Swab  Result Value Ref Range Status   SARS Coronavirus 2 NEGATIVE NEGATIVE Final    Comment: (NOTE) SARS-CoV-2 target nucleic acids are NOT DETECTED. The SARS-CoV-2 RNA is generally detectable in upper and lower respiratory specimens during the acute phase of infection. The lowest concentration of SARS-CoV-2 viral copies this assay can detect is 250 copies / mL. A negative result does not preclude SARS-CoV-2 infection and should not be used as the sole basis for treatment or other patient management decisions.  A negative result may occur with improper specimen collection / handling, submission of specimen other than nasopharyngeal swab, presence of viral mutation(s) within the areas targeted by this assay, and inadequate number of viral copies (<250 copies / mL). A negative  result must be combined with clinical observations, patient history, and epidemiological information. Fact Sheet for Patients:   StrictlyIdeas.no Fact Sheet for Healthcare Providers: BankingDealers.co.za This test is not yet approved or cleared  by the [place] FDA and has been authorized for detection and/or diagnosis of SARS-CoV-2 by FDA under an Emergency Use Authorization (EUA).  This EUA will remain in effect (meaning this test can be used) for the duration of the COVID-19 declaration under Section 564(b)(1) of the Act, 21 U.S.C. section 360bbb-3(b)(1), unless the authorization is terminated or revoked sooner. Performed at [facility], [place] [address], [place], [place] [address]         Radiology Studies: DG Abd Portable 1V  Result Date: 08/18/2019 CLINICAL DATA:  Small-bowel obstruction, follow-up EXAM: PORTABLE ABDOMEN - 1 VIEW COMPARISON:  Portable exam 0744 hours compared to 08/17/2019 FINDINGS: Decreased small bowel distension since prior exam. Few contrast filled loops of minimally prominent small bowel noted in mid abdomen. Scattered gas and stool in nondistended colon. Excreted contrast material in rectum. Atherosclerotic calcifications aorta and iliac arteries. Degenerative changes lumbar spine and BILATERAL hip joints with osseous demineralization. IMPRESSION: Decreased small bowel distension since 08/17/2019. Electronically Signed  By: Ulyses Southward M.D.   On: 08/18/2019 10:46        Scheduled Meds: . Chlorhexidine Gluconate Cloth  6 each Topical Daily  . hydrocortisone sod succinate (SOLU-CORTEF) inj  20 mg Intravenous Q12H  . levothyroxine  12.5 mcg Intravenous Daily  . pantoprazole (PROTONIX) IV  40 mg Intravenous QHS   Continuous Infusions: . dextrose 5 % and 0.9% NaCl 75 mL/hr at 08/19/19 2009     LOS: 3 days     Jacquelin Hawking, MD [facility] 08/20/2019, 7:20 AM  If 7PM-7AM,  please contact night-coverage [email or web address]

## 2019-08-21 ENCOUNTER — Inpatient Hospital Stay (HOSPITAL_COMMUNITY): Payer: Medicare PPO

## 2019-08-21 LAB — GLUCOSE, CAPILLARY
Glucose-Capillary: 91 mg/dL (ref 70–99)
Glucose-Capillary: 91 mg/dL (ref 70–99)

## 2019-08-21 MED ORDER — BISACODYL 10 MG RE SUPP
10.0000 mg | Freq: Once | RECTAL | Status: AC
  Administered 2019-08-21: 10 mg via RECTAL
  Filled 2019-08-21: qty 1

## 2019-08-21 MED ORDER — IOHEXOL 300 MG/ML  SOLN
75.0000 mL | Freq: Once | INTRAMUSCULAR | Status: AC | PRN
  Administered 2019-08-21: 75 mL via INTRAVENOUS

## 2019-08-21 MED ORDER — METOPROLOL TARTRATE 5 MG/5ML IV SOLN
2.5000 mg | Freq: Four times a day (QID) | INTRAVENOUS | Status: DC
  Administered 2019-08-21 – 2019-08-31 (×39): 2.5 mg via INTRAVENOUS
  Filled 2019-08-21 (×37): qty 5

## 2019-08-21 MED ORDER — SODIUM CHLORIDE 0.9 % IV SOLN
3.0000 g | Freq: Four times a day (QID) | INTRAVENOUS | Status: DC
  Administered 2019-08-21 – 2019-08-29 (×33): 3 g via INTRAVENOUS
  Filled 2019-08-21 (×5): qty 3
  Filled 2019-08-21: qty 8
  Filled 2019-08-21 (×6): qty 3
  Filled 2019-08-21: qty 0.5
  Filled 2019-08-21 (×2): qty 3
  Filled 2019-08-21: qty 8
  Filled 2019-08-21 (×3): qty 3
  Filled 2019-08-21: qty 0.5
  Filled 2019-08-21: qty 3
  Filled 2019-08-21: qty 8
  Filled 2019-08-21 (×3): qty 3
  Filled 2019-08-21 (×2): qty 0.5
  Filled 2019-08-21: qty 3
  Filled 2019-08-21: qty 0.5
  Filled 2019-08-21: qty 3
  Filled 2019-08-21: qty 0.5
  Filled 2019-08-21: qty 3
  Filled 2019-08-21: qty 0.5
  Filled 2019-08-21: qty 3

## 2019-08-21 MED ORDER — SODIUM CHLORIDE (PF) 0.9 % IJ SOLN
INTRAMUSCULAR | Status: AC
  Filled 2019-08-21: qty 50

## 2019-08-21 NOTE — Progress Notes (Signed)
Pharmacy Antibiotic Note  JULIEANN DRUMMONDS is a 86 y.o. female presented to he ED on 08/16/2019 with c/o abdominal pain and was found to have SBO. Pharmacy was consulted to start unasyn on 6/13 for dental abscess.  Plan: - unasyn 3gm IV q6h - f/u with maxillofacial CT  _____________________________________  Height: 5' (152.4 cm) Weight: 69 kg (152 lb 1.9 oz) IBW/kg (Calculated) : 45.5  Temp (24hrs), Avg:98.4 F (36.9 C), Min:97.8 F (36.6 C), Max:98.9 F (37.2 C)  Recent Labs  Lab 08/16/19 2332 08/17/19 0641 08/18/19 0552 08/20/19 0705  WBC 14.5* 20.4* 16.6* 4.9  CREATININE 1.24* 1.16* 1.40* 1.00    Estimated Creatinine Clearance: 35.6 mL/min (by C-G formula based on SCr of 1 mg/dL).    Allergies  Allergen Reactions  . Coreg [Carvedilol] Diarrhea     Thank you for allowing pharmacy to be a part of this patient's care.  Lucia Gaskins 08/21/2019 9:23 AM

## 2019-08-21 NOTE — Progress Notes (Signed)
Patient ID: REAGEN GOATES, female   DOB: 24-Aug-1931, 86 y.o.   MRN: [id number]   Acute Care Surgery Service Progress Note:    Chief Complaint/Subjective: Denies pain. Still having lots of flatus, no bowel movement. Abdomen feels a little tight. She reports consuming two full cups of ice yesterday.    Objective: Vital signs in last 24 hours: Temp:  [97.8 F (36.6 C)-98.9 F (37.2 C)] 98.9 F (37.2 C) (06/13 0528) Pulse Rate:  [86-93] 89 (06/13 0528) Resp:  [16-20] 18 (06/13 0528) BP: (163-175)/(60-76) 171/74 (06/13 0624) SpO2:  [95 %-98 %] 95 % (06/13 0528) Last BM Date: 08/15/19  Intake/Output from previous day: 06/12 0701 - 06/13 0700 In: 1695 [I.V.:1695] Out: 2700 [Urine:1350; Emesis/NG output:1350] Intake/Output this shift: No intake/output data recorded.  Lungs:  nonlabored  Cardiovascular: reg  Abd: soft, mildly distended, nontender, central truncal obesity. NG output bilious.   Extremities: no edema, +SCDs  Neuro: alert, nonfocal  Lab Results: CBC  Recent Labs    08/20/19 0705  WBC 4.9  HGB 10.4*  HCT 33.1*  PLT 234   BMET Recent Labs    08/20/19 0705  NA 144  K 4.3  CL 112*  CO2 24  GLUCOSE 119*  BUN 27*  CREATININE 1.00  CALCIUM 7.7*   LFT Hepatic Function Latest Ref Rng & Units 08/16/2019 09/05/2013  Total Protein 6.5 - 8.1 g/dL 6.1(L) 7.1  Albumin 3.5 - 5.0 g/dL 3.4(L) 3.6  AST 15 - 41 U/L 20 25  ALT 0 - 44 U/L 14 15  Alk Phosphatase 38 - 126 U/L 37(L) 54  Total Bilirubin 0.3 - 1.2 mg/dL 0.6 0.6   PT/INR No results for input(s): LABPROT, INR in the last 72 hours. ABG No results for input(s): PHART, HCO3 in the last 72 hours.  Invalid input(s): PCO2, PO2  Studies/Results:  Anti-infectives: Anti-infectives (From admission, onward)   Start     Dose/Rate Route Frequency Ordered Stop   08/21/19 1000  Ampicillin-Sulbactam (UNASYN) 3 g in sodium chloride 0.9 % 100 mL IVPB     Discontinue     3 g 200 mL/hr over 30 Minutes Intravenous  Every 6 hours 08/21/19 0928        Medications: Scheduled Meds: . bisacodyl  10 mg Rectal Once  . Chlorhexidine Gluconate Cloth  6 each Topical Daily  . hydrocortisone sod succinate (SOLU-CORTEF) inj  20 mg Intravenous Daily  . levothyroxine  12.5 mcg Intravenous Daily  . metoprolol tartrate  2.5 mg Intravenous Q6H  . pantoprazole (PROTONIX) IV  40 mg Intravenous QHS   Continuous Infusions: . ampicillin-sulbactam (UNASYN) IV 3 g (08/21/19 1004)  . dextrose 5 % and 0.9% NaCl 50 mL/hr at 08/21/19 1002   PRN Meds:.fentaNYL (SUBLIMAZE) injection, labetalol, lip balm, ondansetron **OR** ondansetron (ZOFRAN) IV  Assessment/Plan: Patient Active Problem List   Diagnosis Date Noted  . SBO (small bowel obstruction) ([place]) 08/17/2019  . Multiple pulmonary nodules 06/19/2017  . Metatarsalgia of left foot 07/13/2014  . Hx of adenomatous colonic polyps 09/05/2013  . Spinal stenosis of lumbar region 09/05/2013  . Lumbar disc disease 09/05/2013  . HYPERLIPIDEMIA 10/23/2009  . GERD 10/23/2009  . Rheumatoid arthritis ([place]) 10/23/2009  . ABDOMINAL PAIN, UNSPECIFIED SITE 10/23/2009  . ANEMIA, HX OF 10/23/2009  . PEPTIC ULCER DISEASE, HX OF 10/23/2009   HTN HLD Hypothyroidism GERD PUD Leukocytosis - resolved 4.9 AKI - resolved, cr 1  SBO -CT abd pelvis 6/8 w/ transition point in the mid abdomen, small amt  free fluid, no free air. - SB protocol 6/9 >> KUB this AM with contrast and air in the colon, decrease in SB distention  - xray 6/10 showed decreased sb distension, contrast in small bowel, gas/stool and contrast in colon - failed clamping trial yesterday but continues to pass flatus with benign exam  Plain films yesterday with mildly dilated small bowel, endorses flatus, and while NG output still bilious she reports consuming 2 full cups of ice so the volume output is likely lower than recorded. She feels urge to have a bowel movement and requesting suppository. Will Stop Ice chips  today and monitor progress, I think if she continues to have large volume bilious output will need exploration this week. I discussed this with her.   ?UTI - f/u urine cx  Disposition:  LOS: 4 days    Clovis Riley  MD, FACS General, Bariatric, & Minimally Invasive Surgery [phone] [facility]

## 2019-08-21 NOTE — Progress Notes (Signed)
PROGRESS NOTE    Pamela Dunn  GYB:[id number] DOB: [birth year] DOA: 08/16/2019 PCP: Pearson Grippe, MD   Brief Narrative: Pamela Dunn is a 86 y.o. female with a history of cardiomyopathy, rheumatoid arthritis, hypothyroidism. Patient presents secondary to abdominal pain and found to have a small bowel obstruction with transition point. General surgery consulted for co-management.    Assessment & Plan:   Principal Problem:   SBO (small bowel obstruction) ([facility]) Active Problems:   GERD   Rheumatoid arthritis ([facility])   Small bowel obstruction CT scan significant for SBO with transition point in the mid abdomen. General surgery consulted. Symptoms initially improved with NG tube but had emesis overnight on 6/11 -General surgery recommendations: NG tube. Abdominal x-ray -Encourage ambulation  Hypotension Unsure of etiology. No worsening symptoms. Improved with IV fluid bolus and stress dose steroids. -Continue maintenance IV fluids while NPO  Purulent dental drainage Possible dental abscess. Patient has been dealing with left mandibular infection in the past. She has been treated by her dentist. Currently with purulent drainage from her lower jaw/gum -CT maxillofacial -Start Unasyn  Rheumatoid arthritis On prednisone 5 mg daily as an outpatient. Started on Solu-medrol IV secondary to NPO status -Solu-Cortef IV while NPO  Hypothyroidism -Continue Synthroid 12.5 mcg IV while NPO  Hyperkalemia Mild. Likely in setting of mild creatinine elevation.  Elevated creatinine Baseline creatinine of 1.1. mildly elevated to 1.24 on admission and trended down with IV fluids.  History of non-ischemic cardiomyopathy Chronic combined systolic and diastolic heart failure EF of 40-45% from 4/8 with associated grade I diastolic heart dysfunction. Patient is not on diuretic therapy as an outpatient. Net positive 980 mL. -Decrease IV fluids to a rate of 50 ml/hr -Daily weights  Leukocytosis WBC  of 14.5K on admission with worsening to 20.4K in setting of IV solumedrol. Trended down and now resolved.  Urinary retention Patient has required multiple In/Out catheterizations. No urge to urinate. Urine culture with diphtheroids. Foley catheter placed 6/12 -Will attempt voiding trial likely on 6/15  Hypertension Patient is on Bidil, Toprol-XL as an outpatient. BP mostly controlled inpatient. -Continue Labetalol prn  Hyperlipidemia On Zetia and Coenzyme Q10 as an outpatient.   DVT prophylaxis: SCDs Code Status:   Code Status: Full Code Family Communication: None at bedside Disposition Plan: Discharge likely home pending progression of SBO and workup of possible dental abscess. Likely several days.   Consultants:   General surgery  Procedures:   NG tube placement  Antimicrobials:  None   Subjective: Passing gas. Feels better. Left sided jaw pain  Objective: Vitals:   08/20/19 2050 08/20/19 2327 08/21/19 0528 08/21/19 0624  BP: (!) 166/60 (!) 170/76 (!) 163/66 (!) 171/74  Pulse: 86 93 89   Resp:  16 18   Temp: 97.8 F (36.6 C) 98.2 F (36.8 C) 98.9 F (37.2 C)   TempSrc: Oral Oral Oral   SpO2: 98% 96% 95%   Weight:      Height:        Intake/Output Summary (Last 24 hours) at 08/21/2019 0951 Last data filed at 08/21/2019 0839 Gross per 24 hour  Intake 1694.96 ml  Output 2350 ml  Net -655.04 ml   Filed Weights   08/17/19 2215  Weight: 69 kg    Examination:  General exam: Appears calm and comfortable  HEENT: purulent drainage noted eminating from lower jaw gum line. Submental firmness with no appreciated fluctuance Respiratory system: Clear to auscultation. Respiratory effort normal. Cardiovascular system: S1 & S2 heard,  RRR. No murmurs, rubs, gallops or clicks. Gastrointestinal system: Abdomen is slightly distended, soft and nontender. No organomegaly or masses felt. Normal bowel sounds heard. Central nervous system: Alert and oriented. No focal  neurological deficits. Musculoskeletal: Upper/Lower extremity edema. No calf tenderness Skin: No cyanosis. No rashes Psychiatry: Judgement and insight appear normal. Mood & affect appropriate.     Data Reviewed: I have personally reviewed following labs and imaging studies  CBC Lab Results  Component Value Date   WBC 4.9 08/20/2019   RBC 3.25 (L) 08/20/2019   HGB 10.4 (L) 08/20/2019   HCT 33.1 (L) 08/20/2019   MCV 101.8 (H) 08/20/2019   MCH 32.0 08/20/2019   PLT 234 08/20/2019   MCHC 31.4 08/20/2019   RDW 15.4 08/20/2019   LYMPHSABS 2.3 08/16/2019   MONOABS 1.0 08/16/2019   EOSABS 0.2 08/16/2019   BASOSABS 0.1 63/03/6008     Last metabolic panel Lab Results  Component Value Date   NA 144 08/20/2019   K 4.3 08/20/2019   CL 112 (H) 08/20/2019   CO2 24 08/20/2019   BUN 27 (H) 08/20/2019   CREATININE 1.00 08/20/2019   GLUCOSE 119 (H) 08/20/2019   GFRNONAA 51 (L) 08/20/2019   GFRAA 59 (L) 08/20/2019   CALCIUM 7.7 (L) 08/20/2019   PHOS 1.8 (L) 08/20/2019   PROT 6.1 (L) 08/16/2019   ALBUMIN 3.4 (L) 08/16/2019   BILITOT 0.6 08/16/2019   ALKPHOS 37 (L) 08/16/2019   AST 20 08/16/2019   ALT 14 08/16/2019   ANIONGAP 8 08/20/2019    CBG (last 3)  Recent Labs    08/20/19 0731 08/20/19 1712 08/21/19 0527  GLUCAP 107* 113* 91     GFR: Estimated Creatinine Clearance: 35.6 mL/min (by C-G formula based on SCr of 1 mg/dL).  Coagulation Profile: No results for input(s): INR, PROTIME in the last 168 hours.  Recent Results (from the past 240 hour(s))  SARS Coronavirus 2 by RT PCR (hospital order, performed in [facility] hospital lab) Nasopharyngeal Nasopharyngeal Swab     Status: None   Collection Time: 08/17/19  3:00 AM   Specimen: Nasopharyngeal Swab  Result Value Ref Range Status   SARS Coronavirus 2 NEGATIVE NEGATIVE Final    Comment: (NOTE) SARS-CoV-2 target nucleic acids are NOT DETECTED. The SARS-CoV-2 RNA is generally detectable in upper and  lower respiratory specimens during the acute phase of infection. The lowest concentration of SARS-CoV-2 viral copies this assay can detect is 250 copies / mL. A negative result does not preclude SARS-CoV-2 infection and should not be used as the sole basis for treatment or other patient management decisions.  A negative result may occur with improper specimen collection / handling, submission of specimen other than nasopharyngeal swab, presence of viral mutation(s) within the areas targeted by this assay, and inadequate number of viral copies (<250 copies / mL). A negative result must be combined with clinical observations, patient history, and epidemiological information. Fact Sheet for Patients:   StrictlyIdeas.no Fact Sheet for Healthcare Providers: BankingDealers.co.za This test is not yet approved or cleared  by the [place] FDA and has been authorized for detection and/or diagnosis of SARS-CoV-2 by FDA under an Emergency Use Authorization (EUA).  This EUA will remain in effect (meaning this test can be used) for the duration of the COVID-19 declaration under Section 564(b)(1) of the Act, 21 U.S.C. section 360bbb-3(b)(1), unless the authorization is terminated or revoked sooner. Performed at [facility], [address], [place], [place] [address]  Culture, Urine     Status: Abnormal   Collection Time: 08/18/19  5:26 PM   Specimen: Urine, Catheterized  Result Value Ref Range Status   Specimen Description   Final    URINE, CATHETERIZED Performed at [facility], [address] [place], [place] [address]    Special Requests   Final    NONE Performed at [facility], [address] [place], [place] [address]    Culture (A)  Final    30,000 COLONIES/mL DIPHTHEROIDS(CORYNEBACTERIUM SPECIES) Standardized susceptibility testing for this organism is not  available. Performed at [facility], [address] [place], [place] [address]    Report Status 08/20/2019 FINAL  Final        Radiology Studies: DG ABD ACUTE 2+V W 1V CHEST  Result Date: 08/20/2019 CLINICAL DATA:  Abdominal swelling, possible small bowel obstruction. EXAM: DG ABDOMEN ACUTE W/ 1V CHEST COMPARISON:  08/18/2019 and chest x-ray 08/16/2019 FINDINGS: Lungs are adequately inflated demonstrate mild right base opacification likely linear atelectasis and possible small amount of pleural fluid. Mild linear atelectasis left mid to lower lung. Cardiomediastinal silhouette and remainder of the chest is unchanged. Abdominopelvic images demonstrate a nasogastric tube with tip and side-port over the stomach in the midline of the mid abdomen. Bowel gas pattern demonstrates a few mildly dilated air-filled central small bowel loops and few scattered air-fluid levels. No free peritoneal air. Air is present within the colon. Paucity of gas over the rectum. Remainder the exam is unchanged. IMPRESSION: 1. Persistent mildly dilated air-filled small bowel loops in the central abdomen. 2. Mild right base opacification likely atelectasis and possible small amount of pleural fluid. Linear atelectasis left mid to lower lung. 3. Nasogastric tube with tip and side-port over the stomach in the midline mid abdomen. Electronically Signed   By: Elberta Fortis M.D.   On: 08/20/2019 12:23        Scheduled Meds: . Chlorhexidine Gluconate Cloth  6 each Topical Daily  . hydrocortisone sod succinate (SOLU-CORTEF) inj  20 mg Intravenous Daily  . levothyroxine  12.5 mcg Intravenous Daily  . metoprolol tartrate  2.5 mg Intravenous Q6H  . pantoprazole (PROTONIX) IV  40 mg Intravenous QHS   Continuous Infusions: . ampicillin-sulbactam (UNASYN) IV    . dextrose 5 % and 0.9% NaCl 75 mL/hr at 08/20/19 2304     LOS: 4 days     Jacquelin Hawking, MD [facility] 08/21/2019, 9:51 AM  If 7PM-7AM, please  contact night-coverage [email or web address]

## 2019-08-21 NOTE — Plan of Care (Signed)
Patient with belching and passing some flatus,  did have a small formed stool after receiving suppository.  Ambulated in hallway x 1 this shift, sat up in chair majority of shift.  Son and daughter in law at bedside most of the day.

## 2019-08-22 ENCOUNTER — Inpatient Hospital Stay (HOSPITAL_COMMUNITY): Payer: Medicare PPO

## 2019-08-22 LAB — CBC
HCT: 33.9 % — ABNORMAL LOW (ref 36.0–46.0)
Hemoglobin: 10.7 g/dL — ABNORMAL LOW (ref 12.0–15.0)
MCH: 31.5 pg (ref 26.0–34.0)
MCHC: 31.6 g/dL (ref 30.0–36.0)
MCV: 99.7 fL (ref 80.0–100.0)
Platelets: 251 10*3/uL (ref 150–400)
RBC: 3.4 MIL/uL — ABNORMAL LOW (ref 3.87–5.11)
RDW: 15.2 % (ref 11.5–15.5)
WBC: 7.5 10*3/uL (ref 4.0–10.5)
nRBC: 0.4 % — ABNORMAL HIGH (ref 0.0–0.2)

## 2019-08-22 LAB — BASIC METABOLIC PANEL
Anion gap: 13 (ref 5–15)
BUN: 18 mg/dL (ref 8–23)
CO2: 22 mmol/L (ref 22–32)
Calcium: 7.1 mg/dL — ABNORMAL LOW (ref 8.9–10.3)
Chloride: 110 mmol/L (ref 98–111)
Creatinine, Ser: 0.78 mg/dL (ref 0.44–1.00)
GFR calc Af Amer: >60 mL/min (ref >60)
GFR calc non Af Amer: >60 mL/min (ref >60)
Glucose, Bld: 85 mg/dL (ref 70–99)
Potassium: 3.5 mmol/L (ref 3.5–5.1)
Sodium: 145 mmol/L (ref 135–145)

## 2019-08-22 LAB — GLUCOSE, CAPILLARY
Glucose-Capillary: 124 mg/dL — ABNORMAL HIGH (ref 70–99)
Glucose-Capillary: 84 mg/dL (ref 70–99)
Glucose-Capillary: 94 mg/dL (ref 70–99)

## 2019-08-22 MED ORDER — MUSCLE RUB 10-15 % EX CREA
1.0000 "application " | TOPICAL_CREAM | CUTANEOUS | Status: DC | PRN
  Administered 2019-08-23 – 2019-08-29 (×3): 1 via TOPICAL
  Filled 2019-08-22: qty 85

## 2019-08-22 NOTE — Consult Note (Signed)
DENTAL CONSULTATION  Date of Consultation:  08/22/2019 Patient Name:   Pamela Dunn Date of Birth:   1931-11-15 Medical Record Number: [id number]  VITALS: BP (!) 184/70 (BP Location: Right Arm) Comment: It was time for pt's sch Metoprolol so it was given not PRN   Pulse 88   Temp 98.6 F (37 C) (Oral)   Resp 18   Ht 5' (1.524 m)   Wt 70 kg   SpO2 96%   BMI 30.14 kg/m   CHIEF COMPLAINT: Patient referred by Dr. Caleb Popp for a dental consultation.  HPI: Pamela Dunn is an 86 year old female recently admitted for small bowel obstruction.  Patient subsequently started complaining of lower left quadrant pain and possible dental abscess.  Dental consultation was then requested to evaluate dentition as a source of dental infection.  Patient currently denies acute toothache symptoms.  Patient indicates she does have a history of some purulence in the area of the lower left quadrant area of tooth #20.  This is reported by the nurse approximately 2 to 3 days ago.  Patient was subsequently started on Unasyn IV antibiotic therapy with resolution of the swelling and pus by patient report.  Patient last saw dentist on July 04, 2019.  Patient was evaluated for lower left tenderness.  The partial denture was adjusted at that time.  Periapical radiograph was taken of tooth #23 when mobility was noted.  Patient was then placed on amoxicillin antibiotic therapy for 10 days with no further treatment provided after that date.  Patient saw Dr. Aundria Rud at that time.  Patient denies having dental phobia.  Patient indicates that she has a mandibular partial denture that has had multiple repairs and multiple teeth extracted from the original partial fabricated.  Patient does not have partial denture with her today.  PROBLEM LIST: Patient Active Problem List   Diagnosis Date Noted  . SBO (small bowel obstruction) (HCC) 08/17/2019  . Multiple pulmonary nodules 06/19/2017  . Metatarsalgia of left foot 07/13/2014   . Hx of adenomatous colonic polyps 09/05/2013  . Spinal stenosis of lumbar region 09/05/2013  . Lumbar disc disease 09/05/2013  . HYPERLIPIDEMIA 10/23/2009  . GERD 10/23/2009  . Rheumatoid arthritis (HCC) 10/23/2009  . ABDOMINAL PAIN, UNSPECIFIED SITE 10/23/2009  . ANEMIA, HX OF 10/23/2009  . PEPTIC ULCER DISEASE, HX OF 10/23/2009    PMH: Past Medical History:  Diagnosis Date  . Allergy    seasonal  . Anemia   . Arthritis   . GERD (gastroesophageal reflux disease)   . Hyperlipidemia   . Hypertension     PSH: Past Surgical History:  Procedure Laterality Date  . APPENDECTOMY    . BACK SURGERY      ALLERGIES: Allergies  Allergen Reactions  . Coreg [Carvedilol] Diarrhea    MEDICATIONS: Current Facility-Administered Medications  Medication Dose Route Frequency Provider Last Rate Last Admin  . Ampicillin-Sulbactam (UNASYN) 3 g in sodium chloride 0.9 % 100 mL IVPB  3 g Intravenous Q6H Pham, Anh P, RPH 200 mL/hr at 08/22/19 1130 3 g at 08/22/19 1130  . Chlorhexidine Gluconate Cloth 2 % PADS 6 each  6 each Topical Daily Narda Bonds, MD   6 each at 08/22/19 1341  . dextrose 5 %-0.9 % sodium chloride infusion   Intravenous Continuous Narda Bonds, MD 50 mL/hr at 08/22/19 1148 New Bag at 08/22/19 1148  . fentaNYL (SUBLIMAZE) injection 25 mcg  25 mcg Intravenous Q4H PRN Eduard Clos, MD   25 mcg  at 08/22/19 1115  . hydrocortisone sodium succinate (SOLU-CORTEF) 100 MG injection 20 mg  20 mg Intravenous Daily Michael Boston, MD   20 mg at 08/22/19 1120  . labetalol (NORMODYNE) injection 10 mg  10 mg Intravenous Q2H PRN Rise Patience, MD   10 mg at 08/21/19 3154  . levothyroxine (SYNTHROID, LEVOTHROID) injection 12.5 mcg  12.5 mcg Intravenous Daily Rise Patience, MD   12.5 mcg at 08/22/19 1122  . lip balm (CARMEX) ointment   Topical PRN Mariel Aloe, MD   Given at 08/19/19 [phone]  . metoprolol tartrate (LOPRESSOR) injection 2.5 mg  2.5 mg Intravenous Q6H  Mariel Aloe, MD   2.5 mg at 08/22/19 1141  . ondansetron (ZOFRAN) tablet 4 mg  4 mg Oral Q6H PRN Rise Patience, MD       Or  . ondansetron [facility]) injection 4 mg  4 mg Intravenous Q6H PRN Rise Patience, MD   4 mg at 08/19/19 2009  . pantoprazole (PROTONIX) injection 40 mg  40 mg Intravenous QHS Jill Alexanders, PA-C   40 mg at 08/21/19 2100    LABS: Lab Results  Component Value Date   WBC 7.5 08/22/2019   HGB 10.7 (L) 08/22/2019   HCT 33.9 (L) 08/22/2019   MCV 99.7 08/22/2019   PLT 251 08/22/2019      Component Value Date/Time   NA 145 08/22/2019 0503   K 3.5 08/22/2019 0503   CL 110 08/22/2019 0503   CO2 22 08/22/2019 0503   GLUCOSE 85 08/22/2019 0503   BUN 18 08/22/2019 0503   CREATININE 0.78 08/22/2019 0503   CALCIUM 7.1 (L) 08/22/2019 0503   GFRNONAA >60 08/22/2019 0503   GFRAA >60 08/22/2019 0503   No results found for: INR, PROTIME No results found for: PTT  SOCIAL HISTORY: Social History   Socioeconomic History  . Marital status: Widowed    Spouse name: Not on file  . Number of children: 2  . Years of education: Not on file  . Highest education level: Not on file  Occupational History  . Not on file  Tobacco Use  . Smoking status: Never Smoker  . Smokeless tobacco: Never Used  Vaping Use  . Vaping Use: Never used  Substance and Sexual Activity  . Alcohol use: No  . Drug use: No  . Sexual activity: Not on file  Other Topics Concern  . Not on file  Social History Narrative  . Not on file   Social Determinants of Health   Financial Resource Strain:   . Difficulty of Paying Living Expenses:   Food Insecurity:   . Worried About Charity fundraiser in the Last Year:   . Arboriculturist in the Last Year:   Transportation Needs:   . Film/video editor (Medical):   [name] Kitchen Lack of Transportation (Non-Medical):   Physical Activity:   . Days of Exercise per Week:   . Minutes of Exercise per Session:   Stress:   . Feeling of  Stress :   Social Connections:   . Frequency of Communication with Friends and Family:   . Frequency of Social Gatherings with Friends and Family:   . Attends Religious Services:   . Active Member of Clubs or Organizations:   . Attends Archivist Meetings:   [name] Kitchen Marital Status:   Intimate Partner Violence:   . Fear of Current or Ex-Partner:   . Emotionally Abused:   [name] Kitchen Physically Abused:   .  Sexually Abused:     FAMILY HISTORY: Family History  Problem Relation Age of Onset  . Breast cancer Maternal Aunt   . Cancer Mother   . Cancer - Colon Sister   . Heart disease Sister   . Cancer - Colon Brother   . Cancer - Colon Brother     REVIEW OF SYSTEMS: Reviewed with the patient as per History of present illness. Psych: Patient denies having dental phobia.  DENTAL HISTORY: CHIEF COMPLAINT: Patient referred by Dr. Caleb Popp for a dental consultation.  HPI: Pamela Dunn is an 86 year old female recently admitted for small bowel obstruction.  Patient subsequently started complaining of lower left quadrant pain and possible dental abscess.  Dental consultation was then requested to evaluate dentition as a source of dental infection.  Patient currently denies acute toothache symptoms.  Patient indicates she does have a history of some purulence in the area of the lower left quadrant area of tooth #20.  This is reported by the nurse approximately 2 to 3 days ago.  Patient was subsequently started on Unasyn IV antibiotic therapy with resolution of the swelling and pus by patient report.  Patient last saw dentist on July 04, 2019.  Patient was evaluated for lower left tenderness.  The partial denture was adjusted at that time.  Periapical radiograph was taken of tooth #23 when mobility was noted.  Patient was then placed on amoxicillin antibiotic therapy for 10 days with no further treatment provided after that date.  Patient saw Dr. Aundria Rud at that time.  Patient denies having dental  phobia.  Patient indicates that she has a mandibular partial denture that has had multiple repairs and multiple teeth extracted from the original partial fabricated.  Patient does not have partial denture with her today.   DENTAL EXAMINATION: GENERAL: Patient is a well-developed, well-nourished female sitting in a chair beside the hospital bed. HEAD AND NECK: There is no significant left facial right facial swelling.  Patient denies acute TMJ symptoms. INTRAORAL EXAM: Patient has incipient xerostomia.  There is no evidence of oral abscess formation. DENTITION: Patient with multiple missing teeth.  Patient has tooth numbers 20, 21, 22, 23, and 32 remaining on the lower arch.  I would need a full series dental radiographs to identify the exact tooth numbers present/missing on the upper arch.   PERIODONTAL: Patient has chronic periodontitis with plaque accumulations, gingival recession, and tooth mobility of tooth numbers 20 and 23 at this time. DENTAL CARIES/SUBOPTIMAL RESTORATIONS: No obvious dental caries are noted. ENDODONTIC: Patient currently is not complaining of acute pulpitis symptoms.  I would need dental radiographs to rule out periapical pathology patient indicates she has had multiple root canal therapies including tooth #21. CROWN AND BRIDGE: Patient has extensive crown and bridge therapy. PROSTHODONTIC: Patient has a lower partial denture that is not with her today in the hospital.  Patient indicates that it is currently ill fitting by report. OCCLUSION: I am unable to assess the occlusion at this time.  RADIOGRAPHIC INTERPRETATION: Patient refused to come down to the [facility] dental clinic today to have the orthopantogram or other dental radiographs obtained.  This is secondary to foot discomfort.  Patient is aware that she will be able to come down to the dental clinic in a wheelchair and we will attempt to have this done tomorrow morning.   ASSESSMENTS: 1.  Small bowel  obstruction-need for exploratory laparotomy currently pending with Dr. Magnus Ivan. 2.  Questionable history of dental abscess involving tooth #20.  3.  Chronic periodontitis of bone loss 4.  Gingival recession 5.  Accretions 6.  Tooth mobility of tooth numbers 20 and 23. 7.  Multiple missing teeth 8.  History of ill fitting partial denture by patient report 9.  Atrophy of the edentulous alveolar ridges 10.  History of Prolia therapy with the risk for osteonecrosis of the jaw with invasive dental procedures   PLAN/RECOMMENDATIONS: 1. I discussed the risks, benefits, and complications of various treatment options with the patient in relationship to her medical and dental conditions, history of Prolia, and risk for osteonecrosis of the jaw with invasive dental procedures due to previous Prolia therapy. We discussed various treatment options to include no treatment, multiple extractions with alveoloplasty, pre-prosthetic surgery as indicated, periodontal therapy, dental restorations, root canal therapy, crown and bridge therapy, implant therapy, and replacement of missing teeth as indicated. The patient currently is leaning towards extraction of tooth numbers 20 and 23 if patient needs to go to the operating room for surgery.  Ideally I would like to attempt to obtain periapical radiographs tomorrow in the dental clinic to further evaluate area of tooth numbers 20 through 23 .  However, if I am unable to do so before the general surgery is planned, the patient wished to proceed with extraction of tooth numbers 20 and 23 at this time in conjunction with the anticipated exploratory laparotomy.  The patient is aware of the potential risk of osteonecrosis of the jaw with the anticipated extraction of these teeth due to the previous 3 years of Prolia therapy.  Patient expresses understanding and accepts the risk for the osteonecrosis of the jaw.  Patient indicates that she has had previous extractions within the  last year without complications with healing.  Dr. Magnus Ivan has been contacted and will attempt to contact Dental Medicine to coordinate surgical procedures as indicated based on the availability of OR time and Surgeon time.  2. Discussion of findings with medical team and coordination of future medical and dental care as needed.     Charlynne Pander, DDS [phone] Pager

## 2019-08-22 NOTE — Progress Notes (Signed)
Pt NG tube clamped per order and started on clears.

## 2019-08-22 NOTE — Care Management Important Message (Signed)
Important Message  Patient Details IM Letter given to Lanier Clam RN Case Manager to present to the Patient Name: Pamela Dunn MRN: [id number] Date of Birth: [birth year]   Medicare Important Message Given:  Yes     Caren Macadam 08/22/2019, 1:40 PM

## 2019-08-22 NOTE — Progress Notes (Signed)
Pt continues to report a lot of belching this shift.

## 2019-08-22 NOTE — Progress Notes (Signed)
Patient ID: Pamela Dunn, female   DOB: [birth year], 86 y.o.   MRN: [id number]       Subjective: Feels well.  No pain.  Flatus yesterday along with a BM.  ROS: See above, otherwise other systems negative  Objective: Vital signs in last 24 hours: Temp:  [98.4 F (36.9 C)-98.7 F (37.1 C)] 98.6 F (37 C) (06/14 0505) Pulse Rate:  [80-88] 88 (06/14 0505) Resp:  [17-20] 18 (06/14 0505) BP: (150-184)/(64-73) 184/70 (06/14 0505) SpO2:  [96 %-100 %] 96 % (06/14 0505) Weight:  [70 kg] 70 kg (06/14 0500) Last BM Date: 08/15/19  Intake/Output from previous day: 06/13 0701 - 06/14 0700 In: 1647.4 [I.V.:1247.4; IV Piggyback:400] Out: 2150 [Urine:1400; Emesis/NG output:750] Intake/Output this shift: No intake/output data recorded.  PE: Abd: soft, some bloating, hypoactive BS, nontender, obese, NGT with some bilious output present, but only about 350cc/24hrs  Lab Results:  Recent Labs    08/20/19 0705 08/22/19 0503  WBC 4.9 7.5  HGB 10.4* 10.7*  HCT 33.1* 33.9*  PLT 234 251   BMET Recent Labs    08/20/19 0705 08/22/19 0503  NA 144 145  K 4.3 3.5  CL 112* 110  CO2 24 22  GLUCOSE 119* 85  BUN 27* 18  CREATININE 1.00 0.78  CALCIUM 7.7* 7.1*   PT/INR No results for input(s): LABPROT, INR in the last 72 hours. CMP     Component Value Date/Time   NA 145 08/22/2019 0503   K 3.5 08/22/2019 0503   CL 110 08/22/2019 0503   CO2 22 08/22/2019 0503   GLUCOSE 85 08/22/2019 0503   BUN 18 08/22/2019 0503   CREATININE 0.78 08/22/2019 0503   CALCIUM 7.1 (L) 08/22/2019 0503   PROT 6.1 (L) 08/16/2019 2332   ALBUMIN 3.4 (L) 08/16/2019 2332   AST 20 08/16/2019 2332   ALT 14 08/16/2019 2332   ALKPHOS 37 (L) 08/16/2019 2332   BILITOT 0.6 08/16/2019 2332   GFRNONAA >60 08/22/2019 0503   GFRAA >60 08/22/2019 0503   Lipase     Component Value Date/Time   LIPASE 48 08/16/2019 2332       Studies/Results: CT MAXILLOFACIAL W CONTRAST  Result Date: 08/21/2019 CLINICAL DATA:   Mass, lump for swelling of maxillofacial structures. Abscess left tooth. EXAM: CT MAXILLOFACIAL WITH CONTRAST TECHNIQUE: Multidetector CT imaging of the maxillofacial structures was performed with intravenous contrast. Multiplanar CT image reconstructions were also generated. CONTRAST:  8mL OMNIPAQUE IOHEXOL 300 MG/ML  SOLN COMPARISON:  None. FINDINGS: Osseous: Along the labial aspect of the left paramedian mandible is a probable 5 mm subperiosteal abscess. Mild subcutaneous edema is seen along the chin is attributed to related cellulitis. Tooth 20 shows a periapical erosion. No evidence of fracture or mandibular dislocation. Advanced cervical spine degeneration with multilevel ankylosis. There is LN toe occipital ankylosis and C2-3 posterior arthrodesis hardware. Bilateral TMJ arthritis. Orbits: Bilateral cataract resection. No acute inflammation or masslike finding Sinuses: Left maxillary sinusitis with near complete opacification Soft tissues: Subcutaneous fat reticulation along the left chin. There is an enteric tube which traverses the right nose and pharynx Limited intracranial: No acute finding IMPRESSION: 1. Probable 5 mm subperiosteal abscess on the labial surface of the left paramedian mandible. There is a periapical erosion at tooth 20. 2. Left maxillary sinusitis. Electronically Signed   By: Marnee Spring M.D.   On: 08/21/2019 11:29   DG ABD ACUTE 2+V W 1V CHEST  Result Date: 08/20/2019 CLINICAL DATA:  Abdominal swelling, possible small  bowel obstruction. EXAM: DG ABDOMEN ACUTE W/ 1V CHEST COMPARISON:  08/18/2019 and chest x-ray 08/16/2019 FINDINGS: Lungs are adequately inflated demonstrate mild right base opacification likely linear atelectasis and possible small amount of pleural fluid. Mild linear atelectasis left mid to lower lung. Cardiomediastinal silhouette and remainder of the chest is unchanged. Abdominopelvic images demonstrate a nasogastric tube with tip and side-port over the stomach  in the midline of the mid abdomen. Bowel gas pattern demonstrates a few mildly dilated air-filled central small bowel loops and few scattered air-fluid levels. No free peritoneal air. Air is present within the colon. Paucity of gas over the rectum. Remainder the exam is unchanged. IMPRESSION: 1. Persistent mildly dilated air-filled small bowel loops in the central abdomen. 2. Mild right base opacification likely atelectasis and possible small amount of pleural fluid. Linear atelectasis left mid to lower lung. 3. Nasogastric tube with tip and side-port over the stomach in the midline mid abdomen. Electronically Signed   By: Marin Olp M.D.   On: 08/20/2019 12:23    Anti-infectives: Anti-infectives (From admission, onward)   Start     Dose/Rate Route Frequency Ordered Stop   08/21/19 1000  Ampicillin-Sulbactam (UNASYN) 3 g in sodium chloride 0.9 % 100 mL IVPB     Discontinue     3 g 200 mL/hr over 30 Minutes Intravenous Every 6 hours 08/21/19 0928         Assessment/Plan HTN HLD Hypothyroidism GERD PUD Leukocytosis -resolved 4.9 AKI - resolved, cr 1 Subperiosteal abscess at tooth 20 - defer to medicine  SBO -has failed one clamping trial -had a BM yesterday still with flatus and NGT output down to 350cc/24hrs. -will try again to clamp NGT and give Clears from the floor.  If she fails this, then will need surgical exploration. -plain film pending today -cont to mobilize -IS/pulm toilet  FEN - IVFs/NGT clamped, sips of clears from floor VTE - ok for chemical prophylaxis from surgery standpoint ID - unasyn for dental abscess   LOS: 5 days    Henreitta Cea , [facility] 08/22/2019, 9:07 AM Please see Amion for pager number during day hours 7:00am-4:30pm or 7:00am -11:30am on weekends

## 2019-08-22 NOTE — Progress Notes (Signed)
PROGRESS NOTE    Pamela Dunn  WUJ:[id number] DOB: 17-Apr-1931 DOA: 08/16/2019 PCP: Pearson Grippe, MD   Brief Narrative: Pamela Dunn is a 86 y.o. female with a history of cardiomyopathy, rheumatoid arthritis, hypothyroidism. Patient presents secondary to abdominal pain and found to have a small bowel obstruction with transition point. General surgery consulted for co-management.    Assessment & Plan:   Principal Problem:   SBO (small bowel obstruction) (HCC) Active Problems:   GERD   Rheumatoid arthritis (HCC)   Small bowel obstruction CT scan significant for SBO with transition point in the mid abdomen. General surgery consulted. Symptoms initially improved with NG tube but had emesis overnight on 6/11 -General surgery recommendations: NG tube clamping tiral. -Encourage ambulation  Hypotension Unsure of etiology. No worsening symptoms. Improved with IV fluid bolus and stress dose steroids. -Continue maintenance IV fluids  Dental abscess Possible dental abscess. Patient has been dealing with left mandibular infection in the past. She has been treated by her dentist. Currently with purulent drainage from her lower jaw/gum. CT maxillofacial significant for 5 mm abscess -Continue Unasyn -Consult Dentistry  Rheumatoid arthritis On prednisone 5 mg daily as an outpatient. Started on Solu-medrol IV secondary to NPO status -Solu-Cortef IV while NPO  Hypothyroidism -Continue Synthroid 12.5 mcg IV while NPO  Hyperkalemia Mild. Likely in setting of mild creatinine elevation.  Elevated creatinine Baseline creatinine of 1.1. mildly elevated to 1.24 on admission and trended down with IV fluids.  History of non-ischemic cardiomyopathy Chronic combined systolic and diastolic heart failure EF of 40-45% from 4/8 with associated grade I diastolic heart dysfunction. Patient is not on diuretic therapy as an outpatient. Net positive 980 mL. -Continue IV fluids to a rate of 50 ml/hr -Daily  weights  Leukocytosis WBC of 14.5K on admission with worsening to 20.4K in setting of IV solumedrol. Trended down and now resolved.  Urinary retention Patient has required multiple In/Out catheterizations. No urge to urinate. Urine culture with diphtheroids. Foley catheter placed 6/12 -Will attempt voiding trial likely on 6/15 pending on surgery plans  Hypertension Patient is on Bidil, Toprol-XL as an outpatient. BP mostly controlled inpatient. -Continue Labetalol prn  Anemia Downtrend from admission. No evidence of bleeding -Trend  Hyperlipidemia On Zetia and Coenzyme Q10 as an outpatient.   DVT prophylaxis: SCDs Code Status:   Code Status: Full Code Family Communication: None at bedside Disposition Plan: Discharge likely home pending progression of SBO and workup of possible dental abscess. Likely several days.   Consultants:   General surgery  Procedures:   NG tube placement  Antimicrobials:  None   Subjective: Having a bowel movement. Still with significant NG output. No abdominal pain.  Objective: Vitals:   08/21/19 1437 08/21/19 2120 08/22/19 0500 08/22/19 0505  BP: (!) 150/64 (!) 154/73  (!) 184/70  Pulse: 80 86  88  Resp: 20 17  18   Temp: 98.7 F (37.1 C) 98.4 F (36.9 C)  98.6 F (37 C)  TempSrc: Oral Oral  Oral  SpO2: 100% 96%  96%  Weight:   70 kg   Height:        Intake/Output Summary (Last 24 hours) at 08/22/2019 1040 Last data filed at 08/22/2019 0955 Gross per 24 hour  Intake 1512.37 ml  Output 1850 ml  Net -337.63 ml   Filed Weights   08/17/19 2215 08/22/19 0500  Weight: 69 kg 70 kg    Examination:  General exam: Appears calm and comfortable Respiratory system: Clear to auscultation.  Respiratory effort normal. Cardiovascular system: S1 & S2 heard, RRR. No murmurs, rubs, gallops or clicks. Gastrointestinal system: Abdomen is mildly distended, soft and nontender. No organomegaly or masses felt. Normal bowel sounds heard. Central  nervous system: Alert and oriented. No focal neurological deficits. Musculoskeletal: No edema. No calf tenderness Skin: No cyanosis. No rashes Psychiatry: Judgement and insight appear normal. Mood & affect appropriate.      Data Reviewed: I have personally reviewed following labs and imaging studies  CBC Lab Results  Component Value Date   WBC 7.5 08/22/2019   RBC 3.40 (L) 08/22/2019   HGB 10.7 (L) 08/22/2019   HCT 33.9 (L) 08/22/2019   MCV 99.7 08/22/2019   MCH 31.5 08/22/2019   PLT 251 08/22/2019   MCHC 31.6 08/22/2019   RDW 15.2 08/22/2019   LYMPHSABS 2.3 08/16/2019   MONOABS 1.0 08/16/2019   EOSABS 0.2 08/16/2019   BASOSABS 0.1 08/16/2019     Last metabolic panel Lab Results  Component Value Date   NA 145 08/22/2019   K 3.5 08/22/2019   CL 110 08/22/2019   CO2 22 08/22/2019   BUN 18 08/22/2019   CREATININE 0.78 08/22/2019   GLUCOSE 85 08/22/2019   GFRNONAA >60 08/22/2019   GFRAA >60 08/22/2019   CALCIUM 7.1 (L) 08/22/2019   PHOS 1.8 (L) 08/20/2019   PROT 6.1 (L) 08/16/2019   ALBUMIN 3.4 (L) 08/16/2019   BILITOT 0.6 08/16/2019   ALKPHOS 37 (L) 08/16/2019   AST 20 08/16/2019   ALT 14 08/16/2019   ANIONGAP 13 08/22/2019    CBG (last 3)  Recent Labs    08/21/19 1146 08/22/19 0000 08/22/19 0822  GLUCAP 91 84 94     GFR: Estimated Creatinine Clearance: 44.9 mL/min (by C-G formula based on SCr of 0.78 mg/dL).  Coagulation Profile: No results for input(s): INR, PROTIME in the last 168 hours.  Recent Results (from the past 240 hour(s))  SARS Coronavirus 2 by RT PCR (hospital order, performed in [facility] hospital lab) Nasopharyngeal Nasopharyngeal Swab     Status: None   Collection Time: 08/17/19  3:00 AM   Specimen: Nasopharyngeal Swab  Result Value Ref Range Status   SARS Coronavirus 2 NEGATIVE NEGATIVE Final    Comment: (NOTE) SARS-CoV-2 target nucleic acids are NOT DETECTED. The SARS-CoV-2 RNA is generally detectable in upper and  lower respiratory specimens during the acute phase of infection. The lowest concentration of SARS-CoV-2 viral copies this assay can detect is 250 copies / mL. A negative result does not preclude SARS-CoV-2 infection and should not be used as the sole basis for treatment or other patient management decisions.  A negative result may occur with improper specimen collection / handling, submission of specimen other than nasopharyngeal swab, presence of viral mutation(s) within the areas targeted by this assay, and inadequate number of viral copies (<250 copies / mL). A negative result must be combined with clinical observations, patient history, and epidemiological information. Fact Sheet for Patients:   BoilerBrush.com.cy Fact Sheet for Healthcare Providers: https://pope.com/ This test is not yet approved or cleared  by the [place] FDA and has been authorized for detection and/or diagnosis of SARS-CoV-2 by FDA under an Emergency Use Authorization (EUA).  This EUA will remain in effect (meaning this test can be used) for the duration of the COVID-19 declaration under Section 564(b)(1) of the Act, 21 U.S.C. section 360bbb-3(b)(1), unless the authorization is terminated or revoked sooner. Performed at [facility], [address] [place],  [place] [address]   Culture, Urine     Status: Abnormal   Collection Time: 08/18/19  5:26 PM   Specimen: Urine, Catheterized  Result Value Ref Range Status   Specimen Description   Final    URINE, CATHETERIZED Performed at [facility], [address] [place], [place] [address]    Special Requests   Final    NONE Performed at [facility], [address], [place], [place] [address]    Culture (A)  Final    30,000 COLONIES/mL DIPHTHEROIDS(CORYNEBACTERIUM SPECIES) Standardized susceptibility testing for this organism is not  available. Performed at [facility], [address] [place], [place] [address]    Report Status 08/20/2019 FINAL  Final        Radiology Studies: CT MAXILLOFACIAL W CONTRAST  Result Date: 08/21/2019 CLINICAL DATA:  Mass, lump for swelling of maxillofacial structures. Abscess left tooth. EXAM: CT MAXILLOFACIAL WITH CONTRAST TECHNIQUE: Multidetector CT imaging of the maxillofacial structures was performed with intravenous contrast. Multiplanar CT image reconstructions were also generated. CONTRAST:  43mL OMNIPAQUE IOHEXOL 300 MG/ML  SOLN COMPARISON:  None. FINDINGS: Osseous: Along the labial aspect of the left paramedian mandible is a probable 5 mm subperiosteal abscess. Mild subcutaneous edema is seen along the chin is attributed to related cellulitis. Tooth 20 shows a periapical erosion. No evidence of fracture or mandibular dislocation. Advanced cervical spine degeneration with multilevel ankylosis. There is LN toe occipital ankylosis and C2-3 posterior arthrodesis hardware. Bilateral TMJ arthritis. Orbits: Bilateral cataract resection. No acute inflammation or masslike finding Sinuses: Left maxillary sinusitis with near complete opacification Soft tissues: Subcutaneous fat reticulation along the left chin. There is an enteric tube which traverses the right nose and pharynx Limited intracranial: No acute finding IMPRESSION: 1. Probable 5 mm subperiosteal abscess on the labial surface of the left paramedian mandible. There is a periapical erosion at tooth 20. 2. Left maxillary sinusitis. Electronically Signed   By: Marnee Spring M.D.   On: 08/21/2019 11:29   DG ABD ACUTE 2+V W 1V CHEST  Result Date: 08/20/2019 CLINICAL DATA:  Abdominal swelling, possible small bowel obstruction. EXAM: DG ABDOMEN ACUTE W/ 1V CHEST COMPARISON:  08/18/2019 and chest x-ray 08/16/2019 FINDINGS: Lungs are adequately inflated demonstrate mild right base opacification likely linear atelectasis and possible small  amount of pleural fluid. Mild linear atelectasis left mid to lower lung. Cardiomediastinal silhouette and remainder of the chest is unchanged. Abdominopelvic images demonstrate a nasogastric tube with tip and side-port over the stomach in the midline of the mid abdomen. Bowel gas pattern demonstrates a few mildly dilated air-filled central small bowel loops and few scattered air-fluid levels. No free peritoneal air. Air is present within the colon. Paucity of gas over the rectum. Remainder the exam is unchanged. IMPRESSION: 1. Persistent mildly dilated air-filled small bowel loops in the central abdomen. 2. Mild right base opacification likely atelectasis and possible small amount of pleural fluid. Linear atelectasis left mid to lower lung. 3. Nasogastric tube with tip and side-port over the stomach in the midline mid abdomen. Electronically Signed   By: Elberta Fortis M.D.   On: 08/20/2019 12:23   DG Abd Portable 1V  Result Date: 08/22/2019 CLINICAL DATA:  Small bowel obstruction. EXAM: PORTABLE ABDOMEN - 1 VIEW COMPARISON:  08/20/2019. FINDINGS: Nasogastric tube terminates in the gastric antrum. Gas is seen in nondilated colon and rectum. No small bowel dilatation. Degenerative changes in the spine and hips. IMPRESSION: Resolving small bowel obstruction. Electronically Signed  By: Lorin Picket M.D.   On: 08/22/2019 09:17        Scheduled Meds: . Chlorhexidine Gluconate Cloth  6 each Topical Daily  . hydrocortisone sod succinate (SOLU-CORTEF) inj  20 mg Intravenous Daily  . levothyroxine  12.5 mcg Intravenous Daily  . metoprolol tartrate  2.5 mg Intravenous Q6H  . pantoprazole (PROTONIX) IV  40 mg Intravenous QHS   Continuous Infusions: . ampicillin-sulbactam (UNASYN) IV 3 g (08/22/19 0343)  . dextrose 5 % and 0.9% NaCl 50 mL/hr at 08/21/19 1443     LOS: 5 days     Cordelia Poche, MD [facility] 08/22/2019, 10:40 AM  If 7PM-7AM, please contact night-coverage [email or web address]

## 2019-08-23 ENCOUNTER — Inpatient Hospital Stay (HOSPITAL_COMMUNITY): Payer: Medicare PPO

## 2019-08-23 DIAGNOSIS — I5032 Chronic diastolic (congestive) heart failure: Secondary | ICD-10-CM

## 2019-08-23 DIAGNOSIS — E039 Hypothyroidism, unspecified: Secondary | ICD-10-CM

## 2019-08-23 DIAGNOSIS — I5042 Chronic combined systolic (congestive) and diastolic (congestive) heart failure: Secondary | ICD-10-CM

## 2019-08-23 LAB — GLUCOSE, CAPILLARY
Glucose-Capillary: 108 mg/dL — ABNORMAL HIGH (ref 70–99)
Glucose-Capillary: 87 mg/dL (ref 70–99)

## 2019-08-23 MED ORDER — ENOXAPARIN SODIUM 40 MG/0.4ML ~~LOC~~ SOLN
40.0000 mg | SUBCUTANEOUS | Status: DC
  Administered 2019-08-23: 40 mg via SUBCUTANEOUS
  Filled 2019-08-23: qty 0.4

## 2019-08-23 MED ORDER — CEFAZOLIN SODIUM-DEXTROSE 2-4 GM/100ML-% IV SOLN
2.0000 g | INTRAVENOUS | Status: AC
  Administered 2019-08-24: 2 g via INTRAVENOUS
  Filled 2019-08-23: qty 100

## 2019-08-23 MED ORDER — IOHEXOL 9 MG/ML PO SOLN
ORAL | Status: AC
  Administered 2019-08-23: 500 mL
  Filled 2019-08-23: qty 1000

## 2019-08-23 MED ORDER — SODIUM CHLORIDE (PF) 0.9 % IJ SOLN
INTRAMUSCULAR | Status: AC
  Filled 2019-08-23: qty 50

## 2019-08-23 MED ORDER — IOHEXOL 9 MG/ML PO SOLN
500.0000 mL | ORAL | Status: AC
  Administered 2019-08-23 (×2): 500 mL via ORAL

## 2019-08-23 MED ORDER — ENOXAPARIN SODIUM 40 MG/0.4ML ~~LOC~~ SOLN
40.0000 mg | SUBCUTANEOUS | Status: DC
  Administered 2019-08-25 – 2019-08-31 (×7): 40 mg via SUBCUTANEOUS
  Filled 2019-08-23 (×8): qty 0.4

## 2019-08-23 MED ORDER — IOHEXOL 300 MG/ML  SOLN
100.0000 mL | Freq: Once | INTRAMUSCULAR | Status: AC | PRN
  Administered 2019-08-23: 100 mL via INTRAVENOUS

## 2019-08-23 NOTE — Progress Notes (Signed)
Patient ID: Pamela Dunn, female   DOB: 1931-04-13, 86 y.o.   MRN: [id number]    CT shows persistent SBO  I discussed with the patient.  She is clearly not improving with conservative management.  I recommend surgery and she agrees.  Will proceed tomorrow to the OR for diagnostic laparoscopy possible open laparotomy with possible laparotomy, lysis of adhesions, possible bowel resection. I discussed the risks which include but are not limited to bleeding, infection, need for bowel resection, injury to surrounding structures, the need for further procedures, cardiopulmonary issues, etc. She agrees to proceed.

## 2019-08-23 NOTE — Progress Notes (Signed)
08/23/2019  Patient:            Pamela Dunn Date of Birth:  1931/03/22 MRN:                [id number]   BP (!) 140/57 (BP Location: Right Arm)   Pulse 92   Temp 97.9 F (36.6 C) (Oral)   Resp 15   Ht 5' (1.524 m)   Wt 70.8 kg   SpO2 93%   BMI 30.48 kg/m    Pamela Dunn is an 86 year old female with medical history significant for cardiomyopathy, rheumatoid arthritis, hypothyroidism.  Patient presented with abdominal pain and found to have small bowel obstruction.  Patient is being followed by general surgery with possible need for exploratory laparotomy as indicated.  Patient developed left jaw pain with questionable history of abscess formation.  Patient then placed on oral antibiotic therapy.  The patient is not currently complaining of toothache symptoms.  Patient found to have some tooth mobility associate with tooth numbers 20 and 23.  Tooth #20 is plus and tooth #23 is 1+.  Patient now presents to the hospital dental clinic for periapical radiographs and further discussion of treatment options.  Procedure: Patient was brought down in a wheelchair from [place].  An orthopantogram could not be obtained due to patient's inability to stand and the inability to obtain the orthopantogram while the patient was in the wheelchair.   4 periapical radiographs of tooth numbers 20, 21, 22, 23, and 32 were taken. Tooth #20 noted to have increased periodontal ligament space but no significant periapical radiolucency. Tooth numbers 20 and 21 and 32 have PFM crowns.  Tooth #21 has a previous root canal therapy. Tooth #32 shows mesial drift.  Dental examination: General: Patient is a well-developed, well-nourished female no acute distress Head neck exam: No evidence of left or right facial swelling.  The left face is nontender to palpation today. Intraoral exam: Patient has xerostomia.  There is no evidence of oral abscess formation.  There is no evidence of buccal or lingual swelling. Dentition:  Patient with multiple missing teeth.  Patient with tooth numbers 20, 21, 22, 23, and 32 remaining in the lower arch.  There is evidence of mandibular incisal attrition. Periodontal: Patient has chronic periodontitis with bone loss.  Tooth #20 has plus mobility.  Tooth #23 is 1+ mobility.  There is evidence of increased periodontal ligament space associated with tooth #20 consistent with this being a partial abutment tooth. Dental caries: No obvious dental caries are noted. Endodontic: Patient currently not complaining of acute pulpitis symptoms.  Tooth #20 is nontender to palpation.  Tooth #20 is nontender to percussion.  There is no evidence of abscess formation today.  Tooth #21 with previous root canal therapy. Crown and bridge: Patient has multiple crown and bridge restorations. Prosthodontics: Patient has a lower partial denture that has had multiple repairs.  Patient does not have a partial denture with her and I am unable to evaluate the prosthesis at this time. Occlusion: I am unable to assess the occlusion at this time.  Patient may be experiencing occlusal trauma to tooth #20 or this may be related to trauma from being a partial denture abutment.   Assessments: 1.  Small bowel obstruction-questionable need for oratory laparotomy pending.   2.  Questionable history of dental abscess involving tooth #20.  I do see some increased periodontal ligament space associated with tooth #20 but this may be related to occlusal trauma or trauma  from being a partial denture abutment.  There is no evidence of abscess formation or buccal or lingual swelling at this time.  Tooth #20 is not sensitive to percussion or palpation at this time. 3.  Chronic periodontitis with bone loss 4.  Gingival recession 5.  Accretions 6.  Tooth mobility associate with tooth numbers 20 and 23. 7.  Multiple missing teeth 8.  History of mandibular partial denture 9.  Atrophy of edentulous alveolar ridges. 10.  History of  Prolia therapy with risk for osteonecrosis of the jaw with invasive dental procedures.   Plan/recommendations:  I had a discussion with the patient concerning the risks benefits and complications ofvarious treatment options with the patient in relationship to her medical and dental conditions, current small bowel obstruction, history of Prolia, and risk for osteonecrosis of the jaw with invasive dental procedures due to the Prolia therapy.  We discussed various treatment options to include no treatment, follow-up with her primary dentist once discharged to formulate a comprehensive plan of care, extraction of tooth numbers 20 and 23 in the operating room if general surgery proceeds with exploratory laparotomy during this admission.  The patient currently wishes to defer any dental treatment at this time as she is asymptomatic.  She indicates she will follow-up with her primary dentist, Dr. Aundria Rud, for discussion of treatment options as an outpatient.  Patient may be referred to oral surgeon for extractions or endodontist for root canal therapy as indicated at that time.     Charlynne Pander, DDS

## 2019-08-23 NOTE — Progress Notes (Addendum)
PROGRESS NOTE    Pamela Dunn  BDZ:[id number] DOB: 1931-03-25 DOA: 08/16/2019 PCP: Pearson Grippe, MD   Brief Narrative: Pamela Dunn is a 86 y.o. female with a history of cardiomyopathy, rheumatoid arthritis, hypothyroidism. Patient presents secondary to abdominal pain and found to have a small bowel obstruction with transition point. General surgery consulted for co-management.    Assessment & Plan:   Principal Problem:   SBO (small bowel obstruction) (HCC) Active Problems:   Hyperlipidemia   GERD   Rheumatoid arthritis (HCC)   Hypothyroidism   Chronic combined systolic and diastolic congestive heart failure (HCC)   Small bowel obstruction CT scan significant for SBO with transition point in the mid abdomen. General surgery consulted. Symptoms initially improved with NG tube but had emesis overnight on 6/11. Second clamping trial on 6/14 with worsening abdominal symptoms an d bilious vomiting. -General surgery recommendations: CT abdomen/pelvis, NG tube w/ intermittent suction -Encourage ambulation -PICC line/TPN tomorrow (6/16) if patient's CT abdomen/pelvis not reassuring  Hypotension Unsure of etiology. No worsening symptoms. Improved with IV fluid bolus and stress dose steroids. Resolved. -Continue maintenance IV fluids  Dental abscess Possible dental abscess. Patient has been dealing with left mandibular infection in the past. She has been treated by her dentist. Currently with purulent drainage from her lower jaw/gum. CT maxillofacial significant for 5 mm abscess -Continue Unasyn -Dentist recommendations: likely tooth extraction to be coordinated with general surgery  Rheumatoid arthritis On prednisone 5 mg daily as an outpatient. Started on Solu-medrol IV secondary to NPO status -Solu-Cortef IV while NPO  Hypothyroidism -Continue Synthroid 12.5 mcg IV while NPO  Hyperkalemia Mild. Likely in setting of mild creatinine elevation.  Elevated creatinine Baseline  creatinine of 1.1. mildly elevated to 1.24 on admission and trended down with IV fluids.  History of non-ischemic cardiomyopathy Chronic combined systolic and diastolic heart failure EF of 40-45% from 4/8 with associated grade I diastolic heart dysfunction. Patient is not on diuretic therapy as an outpatient. Net positive 980 mL. -Continue IV fluids to a rate of 50 ml/hr -Daily weights  Leukocytosis WBC of 14.5K on admission with worsening to 20.4K in setting of IV solumedrol. Trended down and now resolved.  Urinary retention Patient has required multiple In/Out catheterizations. No urge to urinate. Urine culture with diphtheroids. Foley catheter placed 6/12 -Voiding trial after surgery if patient requires surgical management for SBO; if no surgical management, will attempt voiding trial at that time.  Hypertension Patient is on Bidil, Toprol-XL as an outpatient. BP mostly controlled inpatient. -Continue Labetalol prn  Anemia Downtrend from admission and now stable. No evidence of bleeding -Trend  Hyperlipidemia On Zetia and Coenzyme Q10 as an outpatient.   DVT prophylaxis: Lovenox Code Status:   Code Status: Full Code Family Communication: Son at bedside Disposition Plan: Discharge likely home pending progression of SBO and workup of possible dental abscess. Likely several days.    Consultants:   General surgery  Procedures:   NG tube placement  Antimicrobials:  None   Subjective: Having a bowel movement. Still with significant NG output. No abdominal pain.  Objective: Vitals:   08/22/19 2051 08/23/19 0555 08/23/19 0606 08/23/19 0802  BP: (!) 146/68 (!) 153/56  (!) 140/57  Pulse: (!) 105 95  92  Resp: 20 18  15   Temp: 100 F (37.8 C) 99 F (37.2 C)  97.9 F (36.6 C)  TempSrc: Oral Oral  Oral  SpO2: 90% 95%  93%  Weight:   70.8 kg   Height:  Intake/Output Summary (Last 24 hours) at 08/23/2019 0951 Last data filed at 08/23/2019 0614 Gross per 24  hour  Intake 961.61 ml  Output 1175 ml  Net -213.39 ml   Filed Weights   08/17/19 2215 08/22/19 0500 08/23/19 0606  Weight: 69 kg 70 kg 70.8 kg    Examination:  General exam: Appears calm and comfortable Respiratory system: Clear to auscultation. Respiratory effort normal. Cardiovascular system: S1 & S2 heard, RRR. No murmurs, rubs, gallops or clicks. Gastrointestinal system: Abdomen is slightly distended, soft and nontender. No organomegaly or masses felt. Improved bowel sounds heard. Central nervous system: Alert and oriented. No focal neurological deficits. Musculoskeletal: Upper extremity edema. No calf tenderness Skin: No cyanosis. Areas of ecchymosis noted. Psychiatry: Judgement and insight appear normal. Mood & affect appropriate.       Data Reviewed: I have personally reviewed following labs and imaging studies  CBC Lab Results  Component Value Date   WBC 7.5 08/22/2019   RBC 3.40 (L) 08/22/2019   HGB 10.7 (L) 08/22/2019   HCT 33.9 (L) 08/22/2019   MCV 99.7 08/22/2019   MCH 31.5 08/22/2019   PLT 251 08/22/2019   MCHC 31.6 08/22/2019   RDW 15.2 08/22/2019   LYMPHSABS 2.3 08/16/2019   MONOABS 1.0 08/16/2019   EOSABS 0.2 08/16/2019   BASOSABS 0.1 08/16/2019     Last metabolic panel Lab Results  Component Value Date   NA 145 08/22/2019   K 3.5 08/22/2019   CL 110 08/22/2019   CO2 22 08/22/2019   BUN 18 08/22/2019   CREATININE 0.78 08/22/2019   GLUCOSE 85 08/22/2019   GFRNONAA >60 08/22/2019   GFRAA >60 08/22/2019   CALCIUM 7.1 (L) 08/22/2019   PHOS 1.8 (L) 08/20/2019   PROT 6.1 (L) 08/16/2019   ALBUMIN 3.4 (L) 08/16/2019   BILITOT 0.6 08/16/2019   ALKPHOS 37 (L) 08/16/2019   AST 20 08/16/2019   ALT 14 08/16/2019   ANIONGAP 13 08/22/2019    CBG (last 3)  Recent Labs    08/22/19 0822 08/22/19 1717 08/23/19 0024  GLUCAP 94 124* 108*     GFR: Estimated Creatinine Clearance: 45.1 mL/min (by C-G formula based on SCr of 0.78  mg/dL).  Coagulation Profile: No results for input(s): INR, PROTIME in the last 168 hours.  Recent Results (from the past 240 hour(s))  SARS Coronavirus 2 by RT PCR (hospital order, performed in [facility] hospital lab) Nasopharyngeal Nasopharyngeal Swab     Status: None   Collection Time: 08/17/19  3:00 AM   Specimen: Nasopharyngeal Swab  Result Value Ref Range Status   SARS Coronavirus 2 NEGATIVE NEGATIVE Final    Comment: (NOTE) SARS-CoV-2 target nucleic acids are NOT DETECTED. The SARS-CoV-2 RNA is generally detectable in upper and lower respiratory specimens during the acute phase of infection. The lowest concentration of SARS-CoV-2 viral copies this assay can detect is 250 copies / mL. A negative result does not preclude SARS-CoV-2 infection and should not be used as the sole basis for treatment or other patient management decisions.  A negative result may occur with improper specimen collection / handling, submission of specimen other than nasopharyngeal swab, presence of viral mutation(s) within the areas targeted by this assay, and inadequate number of viral copies (<250 copies / mL). A negative result must be combined with clinical observations, patient history, and epidemiological information. Fact Sheet for Patients:   BoilerBrush.com.cy Fact Sheet for Healthcare Providers: https://pope.com/ This test is not yet approved or cleared  by the  [place] Architectural technologist and has been authorized for detection and/or diagnosis of SARS-CoV-2 by FDA under an Print production planner (EUA).  This EUA will remain in effect (meaning this test can be used) for the duration of the COVID-19 declaration under Section 564(b)(1) of the Act, 21 U.S.C. section 360bbb-3(b)(1), unless the authorization is terminated or revoked sooner. Performed at [facility], [place] [address] [place], [place] [address]   Culture, Urine      Status: Abnormal   Collection Time: 08/18/19  5:26 PM   Specimen: Urine, Catheterized  Result Value Ref Range Status   Specimen Description   Final    URINE, CATHETERIZED Performed at [place] [address], [place], [place] [address]    Special Requests   Final    NONE Performed at [facility], [place] [address] [place], [place] [address]    Culture (A)  Final    30,000 COLONIES/mL DIPHTHEROIDS(CORYNEBACTERIUM SPECIES) Standardized susceptibility testing for this organism is not available. Performed at [facility], [place] [address] [place], [place] [address]    Report Status 08/20/2019 FINAL  Final        Radiology Studies: CT MAXILLOFACIAL W CONTRAST  Result Date: 08/21/2019 CLINICAL DATA:  Mass, lump for swelling of maxillofacial structures. Abscess left tooth. EXAM: CT MAXILLOFACIAL WITH CONTRAST TECHNIQUE: Multidetector CT imaging of the maxillofacial structures was performed with intravenous contrast. Multiplanar CT image reconstructions were also generated. CONTRAST:  3mL OMNIPAQUE IOHEXOL 300 MG/ML  SOLN COMPARISON:  None. FINDINGS: Osseous: Along the labial aspect of the left paramedian mandible is a probable 5 mm subperiosteal abscess. Mild subcutaneous edema is seen along the chin is attributed to related cellulitis. Tooth 20 shows a periapical erosion. No evidence of fracture or mandibular dislocation. Advanced cervical spine degeneration with multilevel ankylosis. There is LN toe occipital ankylosis and C2-3 posterior arthrodesis hardware. Bilateral TMJ arthritis. Orbits: Bilateral cataract resection. No acute inflammation or masslike finding Sinuses: Left maxillary sinusitis with near complete opacification Soft tissues: Subcutaneous fat reticulation along the left chin. There is an enteric tube which traverses the right nose and pharynx Limited intracranial: No acute finding IMPRESSION: 1. Probable 5 mm subperiosteal abscess  on the labial surface of the left paramedian mandible. There is a periapical erosion at tooth 20. 2. Left maxillary sinusitis. Electronically Signed   By: Monte Fantasia M.D.   On: 08/21/2019 11:29   DG Abd Portable 1V  Result Date: 08/22/2019 CLINICAL DATA:  Small bowel obstruction. EXAM: PORTABLE ABDOMEN - 1 VIEW COMPARISON:  08/20/2019. FINDINGS: Nasogastric tube terminates in the gastric antrum. Gas is seen in nondilated colon and rectum. No small bowel dilatation. Degenerative changes in the spine and hips. IMPRESSION: Resolving small bowel obstruction. Electronically Signed   By: Lorin Picket M.D.   On: 08/22/2019 09:17        Scheduled Meds: . Chlorhexidine Gluconate Cloth  6 each Topical Daily  . hydrocortisone sod succinate (SOLU-CORTEF) inj  20 mg Intravenous Daily  . levothyroxine  12.5 mcg Intravenous Daily  . metoprolol tartrate  2.5 mg Intravenous Q6H  . pantoprazole (PROTONIX) IV  40 mg Intravenous QHS   Continuous Infusions: . ampicillin-sulbactam (UNASYN) IV 3 g (08/23/19 8527)  . dextrose 5 % and 0.9% NaCl 50 mL/hr at 08/22/19 1148     LOS: 6 days     Cordelia Poche, MD [facility] 08/23/2019, 9:51 AM  If 7PM-7AM, please contact night-coverage [email or web address]

## 2019-08-23 NOTE — Progress Notes (Signed)
This is the anesthesia note from [facility] from March of this year pertaining to her aborted surgery due to inability to obtain her airway.   Airways Urgency: elective  Difficult airway  General Information and Staff  Patient location during procedure: OR  Procedure Staff: Performing Provider: Mehdiratta, Kateri Plummer, MD Authorizing Provider: Mehdiratta, Kateri Plummer, MD  Performed by: attending, SRNA under supervision and CRNA under supervision  Indications and Patient Condition Indications for airway management: anesthesia Preoxygenated: yes Mask difficulty assessment: 1 - easy vent by mask  Additional Comments 1) SRNA with Glidescope LoPro 3: 2b View of very bottom of arytenoids 2) SRNA with McGrath 3: Grade 3 view 3) MD with Glidescope LoPro 3: 2b, unable to pass bougie 4) Two MDs with with Glidescope LoPro 3 + Bronchoscope: Unable to pass bronch 5) Two MDs - Bronchoscope with ovassapian airway: View obtained for area under the epiglottis, but too much edema to visualize vocal cords, unable to pass  Decision made to abort as more edema was seen.  Easily masked with OA throughout.  Hemodynamically stable.  Might suggest regular Glidescope with 3 Blade as I think the LoPro was a little long, but LoPro 2 was too short. Versus asleep or awake FOI as first attempt.

## 2019-08-23 NOTE — Progress Notes (Signed)
Patient ID: Pamela Dunn, female   DOB: 07/05/1931, 86 y.o.   MRN: [id number]       Subjective: Patient states she feels ok.  Still having some flatus.  Denies abdominal pain, but had projectile vomiting after clamping trial yesterday and is having straight bilious output from her NGT today with 1200cc noted since yesterday  ROS: See above, otherwise other systems negative  Objective: Vital signs in last 24 hours: Temp:  [97.9 F (36.6 C)-100 F (37.8 C)] 97.9 F (36.6 C) (06/15 0802) Pulse Rate:  [92-105] 92 (06/15 0802) Resp:  [15-20] 15 (06/15 0802) BP: (140-157)/(56-68) 140/57 (06/15 0802) SpO2:  [90 %-100 %] 93 % (06/15 0802) Weight:  [70.8 kg] 70.8 kg (06/15 0606) Last BM Date: 08/15/19  Intake/Output from previous day: 06/14 0701 - 06/15 0700 In: 961.6 [P.O.:240; I.V.:517.7; IV Piggyback:203.9] Out: 1175 [Urine:275; Emesis/NG output:900] Intake/Output this shift: No intake/output data recorded.  PE: Heart: regular Lungs: CTAB Abd: soft, NT, some BS, NGT in place with dark bilious output  Lab Results:  Recent Labs    08/22/19 0503  WBC 7.5  HGB 10.7*  HCT 33.9*  PLT 251   BMET Recent Labs    08/22/19 0503  NA 145  K 3.5  CL 110  CO2 22  GLUCOSE 85  BUN 18  CREATININE 0.78  CALCIUM 7.1*   PT/INR No results for input(s): LABPROT, INR in the last 72 hours. CMP     Component Value Date/Time   NA 145 08/22/2019 0503   K 3.5 08/22/2019 0503   CL 110 08/22/2019 0503   CO2 22 08/22/2019 0503   GLUCOSE 85 08/22/2019 0503   BUN 18 08/22/2019 0503   CREATININE 0.78 08/22/2019 0503   CALCIUM 7.1 (L) 08/22/2019 0503   PROT 6.1 (L) 08/16/2019 2332   ALBUMIN 3.4 (L) 08/16/2019 2332   AST 20 08/16/2019 2332   ALT 14 08/16/2019 2332   ALKPHOS 37 (L) 08/16/2019 2332   BILITOT 0.6 08/16/2019 2332   GFRNONAA >60 08/22/2019 0503   GFRAA >60 08/22/2019 0503   Lipase     Component Value Date/Time   LIPASE 48 08/16/2019 2332        Studies/Results: CT MAXILLOFACIAL W CONTRAST  Result Date: 08/21/2019 CLINICAL DATA:  Mass, lump for swelling of maxillofacial structures. Abscess left tooth. EXAM: CT MAXILLOFACIAL WITH CONTRAST TECHNIQUE: Multidetector CT imaging of the maxillofacial structures was performed with intravenous contrast. Multiplanar CT image reconstructions were also generated. CONTRAST:  19mL OMNIPAQUE IOHEXOL 300 MG/ML  SOLN COMPARISON:  None. FINDINGS: Osseous: Along the labial aspect of the left paramedian mandible is a probable 5 mm subperiosteal abscess. Mild subcutaneous edema is seen along the chin is attributed to related cellulitis. Tooth 20 shows a periapical erosion. No evidence of fracture or mandibular dislocation. Advanced cervical spine degeneration with multilevel ankylosis. There is LN toe occipital ankylosis and C2-3 posterior arthrodesis hardware. Bilateral TMJ arthritis. Orbits: Bilateral cataract resection. No acute inflammation or masslike finding Sinuses: Left maxillary sinusitis with near complete opacification Soft tissues: Subcutaneous fat reticulation along the left chin. There is an enteric tube which traverses the right nose and pharynx Limited intracranial: No acute finding IMPRESSION: 1. Probable 5 mm subperiosteal abscess on the labial surface of the left paramedian mandible. There is a periapical erosion at tooth 20. 2. Left maxillary sinusitis. Electronically Signed   By: Marnee Spring M.D.   On: 08/21/2019 11:29   DG Abd Portable 1V  Result Date: 08/22/2019 CLINICAL DATA:  Small bowel obstruction. EXAM: PORTABLE ABDOMEN - 1 VIEW COMPARISON:  08/20/2019. FINDINGS: Nasogastric tube terminates in the gastric antrum. Gas is seen in nondilated colon and rectum. No small bowel dilatation. Degenerative changes in the spine and hips. IMPRESSION: Resolving small bowel obstruction. Electronically Signed   By: Lorin Picket M.D.   On: 08/22/2019 09:17     Anti-infectives: Anti-infectives (From admission, onward)   Start     Dose/Rate Route Frequency Ordered Stop   08/21/19 1000  Ampicillin-Sulbactam (UNASYN) 3 g in sodium chloride 0.9 % 100 mL IVPB     Discontinue     3 g 200 mL/hr over 30 Minutes Intravenous Every 6 hours 08/21/19 0928         Assessment/Plan HTN HLD Hypothyroidism GERD PUD Leukocytosis -resolved 4.9 AKI -resolved, cr 1 Subperiosteal abscess at tooth 20 - defer to medicine  SBO -has failed two clamping trial, yet had a BM on Sunday and with normal plain films.  It doesn't make sense why she is having some much frankly bilious output and failing her clamping trials.  Will repeat CT scan today to further evaluate. -if she still has SBO on CT, then she will need OR -cont to mobilize -IS/pulm toilet -if not going to eat soon, will need PICC/TNA  FEN - IVFs/NGT VTE - ok for chemical prophylaxis from surgery standpoint ID - unasyn for dental abscess   LOS: 6 days    Henreitta Cea , [facility] 08/23/2019, 9:41 AM Please see Amion for pager number during day hours 7:00am-4:30pm or 7:00am -11:30am on weekends

## 2019-08-24 ENCOUNTER — Encounter (HOSPITAL_COMMUNITY)
Admission: EM | Disposition: A | Discharge status: Skilled Nursing Facility | Payer: Self-pay | Source: Home / Self Care | Attending: Family Medicine

## 2019-08-24 ENCOUNTER — Inpatient Hospital Stay (HOSPITAL_COMMUNITY): Payer: Medicare PPO | Admitting: Registered Nurse

## 2019-08-24 ENCOUNTER — Encounter (HOSPITAL_COMMUNITY): Payer: Self-pay | Admitting: Internal Medicine

## 2019-08-24 LAB — COMPREHENSIVE METABOLIC PANEL
ALT: 27 U/L (ref 0–44)
AST: 27 U/L (ref 15–41)
Albumin: 2.1 g/dL — ABNORMAL LOW (ref 3.5–5.0)
Alkaline Phosphatase: 45 U/L (ref 38–126)
Anion gap: 13 (ref 5–15)
BUN: 17 mg/dL (ref 8–23)
CO2: 20 mmol/L — ABNORMAL LOW (ref 22–32)
Calcium: 6.8 mg/dL — ABNORMAL LOW (ref 8.9–10.3)
Chloride: 111 mmol/L (ref 98–111)
Creatinine, Ser: 0.94 mg/dL (ref 0.44–1.00)
GFR calc Af Amer: >60 mL/min (ref >60)
GFR calc non Af Amer: 55 mL/min — ABNORMAL LOW (ref >60)
Glucose, Bld: 89 mg/dL (ref 70–99)
Potassium: 3.5 mmol/L (ref 3.5–5.1)
Sodium: 144 mmol/L (ref 135–145)
Total Bilirubin: 1 mg/dL (ref 0.3–1.2)
Total Protein: 4.8 g/dL — ABNORMAL LOW (ref 6.5–8.1)

## 2019-08-24 LAB — GLUCOSE, CAPILLARY
Glucose-Capillary: 103 mg/dL — ABNORMAL HIGH (ref 70–99)
Glucose-Capillary: 68 mg/dL — ABNORMAL LOW (ref 70–99)
Glucose-Capillary: 71 mg/dL (ref 70–99)
Glucose-Capillary: 93 mg/dL (ref 70–99)

## 2019-08-24 LAB — ABO/RH: ABO/RH(D): AB POS

## 2019-08-24 LAB — SURGICAL PCR SCREEN
MRSA, PCR: NEGATIVE
Staphylococcus aureus: POSITIVE — AB

## 2019-08-24 LAB — TYPE AND SCREEN
ABO/RH(D): AB POS
Antibody Screen: NEGATIVE

## 2019-08-24 LAB — MAGNESIUM: Magnesium: 2.3 mg/dL (ref 1.7–2.4)

## 2019-08-24 LAB — PHOSPHORUS: Phosphorus: 1.9 mg/dL — ABNORMAL LOW (ref 2.5–4.6)

## 2019-08-24 SURGERY — LAPAROSCOPY, DIAGNOSTIC
Anesthesia: General

## 2019-08-24 MED ORDER — GLYCOPYRROLATE 0.2 MG/ML IJ SOLN
INTRAMUSCULAR | Status: AC
  Filled 2019-08-24: qty 1

## 2019-08-24 MED ORDER — HYDROMORPHONE HCL 1 MG/ML IJ SOLN
INTRAMUSCULAR | Status: AC
  Administered 2019-08-24: 0.5 mg via INTRAVENOUS
  Filled 2019-08-24: qty 1

## 2019-08-24 MED ORDER — DEXMEDETOMIDINE HCL IN NACL 200 MCG/50ML IV SOLN
INTRAVENOUS | Status: AC
  Filled 2019-08-24: qty 50

## 2019-08-24 MED ORDER — FENTANYL CITRATE (PF) 100 MCG/2ML IJ SOLN
INTRAMUSCULAR | Status: AC
  Filled 2019-08-24: qty 2

## 2019-08-24 MED ORDER — LIDOCAINE 2% (20 MG/ML) 5 ML SYRINGE
INTRAMUSCULAR | Status: AC
  Filled 2019-08-24: qty 5

## 2019-08-24 MED ORDER — DEXAMETHASONE SODIUM PHOSPHATE 10 MG/ML IJ SOLN
INTRAMUSCULAR | Status: AC
  Filled 2019-08-24: qty 1

## 2019-08-24 MED ORDER — FENTANYL CITRATE (PF) 100 MCG/2ML IJ SOLN
INTRAMUSCULAR | Status: AC
  Administered 2019-08-24: 50 ug via INTRAVENOUS
  Filled 2019-08-24: qty 2

## 2019-08-24 MED ORDER — PROPOFOL 10 MG/ML IV BOLUS
INTRAVENOUS | Status: AC
  Filled 2019-08-24: qty 20

## 2019-08-24 MED ORDER — DEXMEDETOMIDINE HCL IN NACL 200 MCG/50ML IV SOLN
INTRAVENOUS | Status: DC | PRN
  Administered 2019-08-24: 12 ug via INTRAVENOUS
  Administered 2019-08-24 (×2): 4 ug via INTRAVENOUS

## 2019-08-24 MED ORDER — LIDOCAINE 5 % EX OINT
TOPICAL_OINTMENT | Freq: Once | CUTANEOUS | Status: DC
  Filled 2019-08-24: qty 35.44

## 2019-08-24 MED ORDER — ROCURONIUM BROMIDE 10 MG/ML (PF) SYRINGE
PREFILLED_SYRINGE | INTRAVENOUS | Status: AC
  Filled 2019-08-24: qty 10

## 2019-08-24 MED ORDER — BUPIVACAINE HCL 0.25 % IJ SOLN
INTRAMUSCULAR | Status: DC | PRN
  Administered 2019-08-24: 40 mL

## 2019-08-24 MED ORDER — POTASSIUM PHOSPHATES 15 MMOLE/5ML IV SOLN
20.0000 mmol | Freq: Once | INTRAVENOUS | Status: AC
  Administered 2019-08-24: 20 mmol via INTRAVENOUS
  Filled 2019-08-24: qty 6.67

## 2019-08-24 MED ORDER — OXYCODONE HCL 5 MG PO TABS: 5.0000 mg | ORAL_TABLET | Freq: Once | ORAL | Status: DC | PRN

## 2019-08-24 MED ORDER — PHENYLEPHRINE 40 MCG/ML (10ML) SYRINGE FOR IV PUSH (FOR BLOOD PRESSURE SUPPORT)
PREFILLED_SYRINGE | INTRAVENOUS | Status: AC
  Filled 2019-08-24: qty 10

## 2019-08-24 MED ORDER — ROCURONIUM BROMIDE 10 MG/ML (PF) SYRINGE
PREFILLED_SYRINGE | INTRAVENOUS | Status: DC | PRN
  Administered 2019-08-24: 40 mg via INTRAVENOUS

## 2019-08-24 MED ORDER — SODIUM CHLORIDE 4 MEQ/ML IV SOLN
INTRAVENOUS | Status: DC
  Filled 2019-08-24 (×4): qty 1000

## 2019-08-24 MED ORDER — PHENYLEPHRINE 40 MCG/ML (10ML) SYRINGE FOR IV PUSH (FOR BLOOD PRESSURE SUPPORT)
PREFILLED_SYRINGE | INTRAVENOUS | Status: DC | PRN
  Administered 2019-08-24: 80 ug via INTRAVENOUS
  Administered 2019-08-24: 120 ug via INTRAVENOUS
  Administered 2019-08-24: 80 ug via INTRAVENOUS
  Administered 2019-08-24: 120 ug via INTRAVENOUS

## 2019-08-24 MED ORDER — DEXTROSE 50 % IV SOLN
INTRAVENOUS | Status: AC
  Administered 2019-08-24: 25 mL
  Filled 2019-08-24: qty 50

## 2019-08-24 MED ORDER — CALCIUM GLUCONATE-NACL 1-0.675 GM/50ML-% IV SOLN
1.0000 g | Freq: Once | INTRAVENOUS | Status: AC
  Administered 2019-08-24: 1000 mg via INTRAVENOUS
  Filled 2019-08-24: qty 50

## 2019-08-24 MED ORDER — LACTATED RINGERS IV SOLN: INTRAVENOUS | Status: DC

## 2019-08-24 MED ORDER — GLYCOPYRROLATE PF 0.2 MG/ML IJ SOSY
PREFILLED_SYRINGE | INTRAMUSCULAR | Status: DC | PRN
  Administered 2019-08-24: .2 mg via INTRAVENOUS

## 2019-08-24 MED ORDER — MIDAZOLAM HCL 5 MG/5ML IJ SOLN
INTRAMUSCULAR | Status: DC | PRN
  Administered 2019-08-24: 1 mg via INTRAVENOUS

## 2019-08-24 MED ORDER — SUCCINYLCHOLINE CHLORIDE 200 MG/10ML IV SOSY
PREFILLED_SYRINGE | INTRAVENOUS | Status: AC
  Filled 2019-08-24: qty 10

## 2019-08-24 MED ORDER — BUPIVACAINE HCL 0.25 % IJ SOLN
INTRAMUSCULAR | Status: AC
  Filled 2019-08-24: qty 1

## 2019-08-24 MED ORDER — LIDOCAINE 5 % EX OINT
TOPICAL_OINTMENT | Freq: Every day | CUTANEOUS | Status: DC | PRN
  Filled 2019-08-24: qty 35.44

## 2019-08-24 MED ORDER — ONDANSETRON HCL 4 MG/2ML IJ SOLN
INTRAMUSCULAR | Status: AC
  Filled 2019-08-24: qty 2

## 2019-08-24 MED ORDER — SUGAMMADEX SODIUM 200 MG/2ML IV SOLN
INTRAVENOUS | Status: DC | PRN
  Administered 2019-08-24: 200 mg via INTRAVENOUS

## 2019-08-24 MED ORDER — DEXAMETHASONE SODIUM PHOSPHATE 10 MG/ML IJ SOLN
INTRAMUSCULAR | Status: DC | PRN
  Administered 2019-08-24: 10 mg via INTRAVENOUS

## 2019-08-24 MED ORDER — ONDANSETRON HCL 4 MG/2ML IJ SOLN
INTRAMUSCULAR | Status: DC | PRN
  Administered 2019-08-24: 4 mg via INTRAVENOUS

## 2019-08-24 MED ORDER — 0.9 % SODIUM CHLORIDE (POUR BTL) OPTIME
TOPICAL | Status: DC | PRN
  Administered 2019-08-24: 3000 mL

## 2019-08-24 MED ORDER — OXYCODONE HCL 5 MG/5ML PO SOLN: 5.0000 mg | Freq: Once | ORAL | Status: DC | PRN

## 2019-08-24 MED ORDER — FENTANYL CITRATE (PF) 100 MCG/2ML IJ SOLN
25.0000 ug | INTRAMUSCULAR | Status: DC | PRN
  Administered 2019-08-24 (×2): 50 ug via INTRAVENOUS

## 2019-08-24 MED ORDER — HYDROMORPHONE HCL 1 MG/ML IJ SOLN
0.5000 mg | INTRAMUSCULAR | Status: DC | PRN
  Administered 2019-08-24 – 2019-08-31 (×5): 0.5 mg via INTRAVENOUS
  Filled 2019-08-24 (×5): qty 0.5

## 2019-08-24 MED ORDER — MIDAZOLAM HCL 2 MG/2ML IJ SOLN
INTRAMUSCULAR | Status: AC
  Filled 2019-08-24: qty 2

## 2019-08-24 MED ORDER — PROPOFOL 10 MG/ML IV BOLUS
INTRAVENOUS | Status: DC | PRN
  Administered 2019-08-24: 120 mg via INTRAVENOUS

## 2019-08-24 MED ORDER — ONDANSETRON HCL 4 MG/2ML IJ SOLN: 4.0000 mg | Freq: Once | INTRAMUSCULAR | Status: DC | PRN

## 2019-08-24 MED ORDER — PHENYLEPHRINE HCL-NACL 20-0.9 MG/250ML-% IV SOLN
INTRAVENOUS | Status: DC | PRN
  Administered 2019-08-24: 25 ug/min via INTRAVENOUS

## 2019-08-24 MED ORDER — FENTANYL CITRATE (PF) 100 MCG/2ML IJ SOLN
INTRAMUSCULAR | Status: DC | PRN
  Administered 2019-08-24 (×2): 25 ug via INTRAVENOUS
  Administered 2019-08-24: 50 ug via INTRAVENOUS

## 2019-08-24 SURGICAL SUPPLY — 75 items
APPLIER CLIP 5 13 M/L LIGAMAX5 (MISCELLANEOUS)
APPLIER CLIP ROT 10 11.4 M/L (STAPLE)
BLADE EXTENDED COATED 6.5IN (ELECTRODE) IMPLANT
BLADE HEX COATED 2.75 (ELECTRODE) IMPLANT
BLADE SURG SZ10 CARB STEEL (BLADE) IMPLANT
BNDG GAUZE ELAST 4 BULKY (GAUZE/BANDAGES/DRESSINGS) ×2 IMPLANT
CHLORAPREP W/TINT 26 (MISCELLANEOUS) ×2 IMPLANT
CLIP APPLIE 5 13 M/L LIGAMAX5 (MISCELLANEOUS) IMPLANT
CLIP APPLIE ROT 10 11.4 M/L (STAPLE) IMPLANT
COVER MAYO STAND STRL (DRAPES) ×2 IMPLANT
COVER SURGICAL LIGHT HANDLE (MISCELLANEOUS) ×2 IMPLANT
COVER WAND RF STERILE (DRAPES) IMPLANT
DECANTER SPIKE VIAL GLASS SM (MISCELLANEOUS) ×2 IMPLANT
DERMABOND ADVANCED (GAUZE/BANDAGES/DRESSINGS) ×1
DERMABOND ADVANCED .7 DNX12 (GAUZE/BANDAGES/DRESSINGS) ×1 IMPLANT
DRAPE LAPAROSCOPIC ABDOMINAL (DRAPES) ×2 IMPLANT
DRAPE WARM FLUID 44X44 (DRAPES) IMPLANT
DRSG PAD ABDOMINAL 8X10 ST (GAUZE/BANDAGES/DRESSINGS) ×2 IMPLANT
ELECT PENCIL ROCKER SW 15FT (MISCELLANEOUS) ×2 IMPLANT
ELECT REM PT RETURN 15FT ADLT (MISCELLANEOUS) ×2 IMPLANT
GAUZE SPONGE 4X4 12PLY STRL (GAUZE/BANDAGES/DRESSINGS) ×2 IMPLANT
GLOVE BIO SURGEON STRL SZ7 (GLOVE) ×2 IMPLANT
GLOVE BIOGEL M 7.0 STRL (GLOVE) ×2 IMPLANT
GLOVE BIOGEL PI IND STRL 7.0 (GLOVE) ×1 IMPLANT
GLOVE BIOGEL PI IND STRL 7.5 (GLOVE) ×1 IMPLANT
GLOVE BIOGEL PI INDICATOR 7.0 (GLOVE) ×1
GLOVE BIOGEL PI INDICATOR 7.5 (GLOVE) ×1
GLOVE SURG SIGNA 7.5 PF LTX (GLOVE) ×2 IMPLANT
GOWN STRL REUS W/ TWL XL LVL3 (GOWN DISPOSABLE) ×1 IMPLANT
GOWN STRL REUS W/TWL LRG LVL3 (GOWN DISPOSABLE) ×4 IMPLANT
GOWN STRL REUS W/TWL XL LVL3 (GOWN DISPOSABLE) ×5 IMPLANT
HANDLE SUCTION POOLE (INSTRUMENTS) ×1 IMPLANT
IRRIG SUCT STRYKERFLOW 2 WTIP (MISCELLANEOUS)
IRRIGATION SUCT STRKRFLW 2 WTP (MISCELLANEOUS) IMPLANT
KIT BASIN (CUSTOM PROCEDURE TRAY) ×2 IMPLANT
KIT TURNOVER KIT A (KITS) IMPLANT
LIGASURE IMPACT 36 18CM CVD LR (INSTRUMENTS) ×2 IMPLANT
NS IRRIG 1000ML POUR BTL (IV SOLUTION) ×2 IMPLANT
PACK GENERAL/GYN (CUSTOM PROCEDURE TRAY) ×2 IMPLANT
PENCIL SMOKE EVACUATOR (MISCELLANEOUS) IMPLANT
RELOAD PROXIMATE 75MM BLUE (ENDOMECHANICALS) ×4 IMPLANT
SET TUBE SMOKE EVAC HIGH FLOW (TUBING) ×2 IMPLANT
SHEARS HARMONIC ACE PLUS 36CM (ENDOMECHANICALS) IMPLANT
SOL ANTI FOG 6CC (MISCELLANEOUS) ×1 IMPLANT
SOLUTION ANTI FOG 6CC (MISCELLANEOUS) ×1
SPONGE LAP 18X18 RF (DISPOSABLE) ×2 IMPLANT
STAPLER 90 3.5 STAND SLIM (STAPLE) ×2
STAPLER 90 3.5 STD SLIM (STAPLE) ×1 IMPLANT
STAPLER PROXIMATE 75MM BLUE (STAPLE) ×2 IMPLANT
STAPLER VISISTAT 35W (STAPLE) ×2 IMPLANT
STRIP CLOSURE SKIN 1/2X4 (GAUZE/BANDAGES/DRESSINGS) IMPLANT
SUCTION POOLE HANDLE (INSTRUMENTS) ×2
SUT MNCRL AB 4-0 PS2 18 (SUTURE) ×2 IMPLANT
SUT PDS AB 1 TP1 54 (SUTURE) ×4 IMPLANT
SUT PDS AB 1 TP1 96 (SUTURE) IMPLANT
SUT PROLENE 2 0 KS (SUTURE) IMPLANT
SUT PROLENE 2 0 SH DA (SUTURE) IMPLANT
SUT SILK 2 0 (SUTURE)
SUT SILK 2 0 SH CR/8 (SUTURE) ×2 IMPLANT
SUT SILK 2-0 18XBRD TIE 12 (SUTURE) IMPLANT
SUT SILK 3 0 (SUTURE)
SUT SILK 3 0 SH CR/8 (SUTURE) ×4 IMPLANT
SUT SILK 3-0 18XBRD TIE 12 (SUTURE) IMPLANT
SYS LAPSCP GELPORT 120MM (MISCELLANEOUS)
SYSTEM LAPSCP GELPORT 120MM (MISCELLANEOUS) IMPLANT
TAPE PAPER 3X10 WHT MICROPORE (GAUZE/BANDAGES/DRESSINGS) ×2 IMPLANT
TOWEL OR 17X26 10 PK STRL BLUE (TOWEL DISPOSABLE) ×2 IMPLANT
TRAY FOLEY MTR SLVR 16FR STAT (SET/KITS/TRAYS/PACK) ×2 IMPLANT
TRAY LAPAROSCOPIC (CUSTOM PROCEDURE TRAY) ×2 IMPLANT
TROCAR XCEL 12X100 BLDLESS (ENDOMECHANICALS) IMPLANT
TROCAR XCEL BLUNT TIP 100MML (ENDOMECHANICALS) IMPLANT
TROCAR XCEL NON-BLD 11X100MML (ENDOMECHANICALS) IMPLANT
TROCAR XCEL UNIV SLVE 11M 100M (ENDOMECHANICALS) IMPLANT
YANKAUER SUCT BULB TIP 10FT TU (MISCELLANEOUS) IMPLANT
YANKAUER SUCT BULB TIP NO VENT (SUCTIONS) ×2 IMPLANT

## 2019-08-24 NOTE — Op Note (Signed)
LAPAROSCOPY DIAGNOSTIC, EXPLORATORY LAPAROTOMY, SMALL BOWEL RESECTION  Procedure Note  LENNAN MALONE 08/24/2019   Pre-op Diagnosis: SMALL BOWEL OBSTRUCTION     Post-op Diagnosis: same  Procedure(s): LAPAROSCOPY DIAGNOSTIC EXPLORATORY LAPAROTOMY SMALL BOWEL RESECTION  Surgeon(s): Abigail Miyamoto, MD Hedda Slade, PA-C  Anesthesia: General  Staff:  Circulator: Brown Human, RN Relief Circulator: Gerda Diss, RN Scrub Person: Keturah Barre, CST; Sherryl Barters M  Estimated Blood Loss: Minimal               Specimens: sent to path  Findings: The patient was found to have a partial closed-loop obstruction from a single adhesive band.  This loop was resected.  Procedure: The patient was brought to operating identifies correct patient.  She was placed upon the operating room table and general anesthesia was induced.  Her abdomen was prepped and draped in usual sterile fashion.  I made a small incision the patient's left upper quadrant with a scalpel.  I then used the 5 mm trocar and Optiview camera to slowly traversed all layers of the abdominal wall and gain entrance to the peritoneal cavity.  The patient was found to have significantly dilated small bowel.  I placed two 5 mm trochars in the patient's left mid and lower quadrant under direct vision.  I then began running small bowel.  As I started running the small bowel to the area of obstruction a small perforation developed just barely grasping the bowel with the Glassman.  At this point decision was made to convert to an open procedure.  I created midline incision with a scalpel.  I then took this down to the fascia with the cautery.  I then opened the peritoneum the entire length incision.  I then eviscerated the small bowel.  There has been a partial closed-loop obstruction from a single adhesive band.  There was almost complete obstruction at the distal end of the loop.  There were 2 areas of perforation at each extent of  the closed-loop.  I placed the suction catheter through 1 of these and suction out the dilated small bowel proximally.  I closed the lower end with a silk suture.  I then transected the small bowel proximal and distal to these areas with a GIA-75 stapler.  I then took down the mesentery with the LigaSure.  This removed approximately a foot of small bowel which was sent to pathology.  We then copiously irrigated the abdomen with several liters of normal saline.  I reapproximated the proximal and distal small bowel together in a side-to-side fashion with silk sutures.  I then performed 2 enterotomies and then created a side-to-side anastomosis with the GIA-75 stapler.  The open end was then closed the TX 90 stapler.  I then reinforced staple line with several interrupted 3-0 silk sutures.  The anastomosis appeared pink and well perfused.  I was able to easily milk contents from the proximal small bowel through the anastomosis.  Closed the mesenteric defect with interrupted silk sutures as well.  This point we again irrigated the abdomen further with normal saline.  I then closed the patient's midline fascia with a running #1 PDS suture as well as #1 Novafil internal retention sutures.  The skin was left open and packed with wet-to-dry saline gauze.  The trocar sites were closed with 4-0 Monocryl sutures and Dermabond.  I did anesthetized the fascia and surrounding skin with Marcaine as well.  Dry gauze and tape were placed over the midline wound.  The patient tolerated procedure well.  All counts were correct at the end of the procedure.  The patient was then extubated in the operating room and taken in a stable condition to the recovery room.          Coralie Keens   Date: 08/24/2019  Time: 12:43 PM

## 2019-08-24 NOTE — Anesthesia Postprocedure Evaluation (Signed)
Anesthesia Post Note  Patient: Pamela Dunn  Procedure(s) Performed: LAPAROSCOPY DIAGNOSTIC (N/A ) EXPLORATORY LAPAROTOMY (N/A ) SMALL BOWEL RESECTION (N/A )     Patient location during evaluation: PACU Anesthesia Type: General Level of consciousness: awake and alert Pain management: pain level controlled Vital Signs Assessment: post-procedure vital signs reviewed and stable Respiratory status: spontaneous breathing, nonlabored ventilation and respiratory function stable Cardiovascular status: blood pressure returned to baseline and stable Postop Assessment: no apparent nausea or vomiting Anesthetic complications: no   No complications documented.  Last Vitals:  Vitals:   08/24/19 1430 08/24/19 1506  BP: 98/60 (!) 111/54  Pulse: 91 91  Resp: 17 18  Temp:  36.5 C  SpO2: 97% 93%    Last Pain:  Vitals:   08/24/19 1506  TempSrc: Axillary  PainSc:                  Lucretia Kern

## 2019-08-24 NOTE — Progress Notes (Signed)
Initial Nutrition Assessment  DOCUMENTATION CODES:   Not applicable  INTERVENTION:  - diet advancement as medically feasible. - will monitor for nutrition support need post-op. - will complete NFPE at follow-up.  NUTRITION DIAGNOSIS:   Inadequate oral intake related to inability to eat as evidenced by NPO status  GOAL:   Patient will meet greater than or equal to 90% of their needs  MONITOR:   Diet advancement, Labs, Weight trends  REASON FOR ASSESSMENT:   Diagnosis  ASSESSMENT:   86 y.o. female with a history of cardiomyopathy, rheumatoid arthritis, and hypothyroidism. She presented to the ED with abdominal pain and found to have a SBO with transition point. General surgery consulted for co-management.  Patient has been NPO except for being on CLD from 6/11 at 0936 to 6/12 at 0630. No intakes documented during that time. NGT (16 F) placed in R nare on 6/9. Flow sheet indicates 700 ml output during day shift on 6/15 and 400 ml output during night shift on 6/15-6/16. No other output documented.   She is currently out of the room to OR for planned diagnostic laparoscopy and possible open laparotomy with possible LOAs and possible bowel resection.   Per chart review, weight today is 153 lb and weight has been fairly stable since admission on 6/9. Current weight is highest weight since at least 06/19/17.   Labs reviewed; Ca: 6.8 mg/dl, Phos: 1.9 mg/dl, GFR: 55 ml/min. Medications reviewed; 20 mg solu-medrol/day, 12.5 mcg IV synthroid/day, 40 mg IV protonix/day, 20 mmol IV KPhos x1 run 6/16. IVF; D5-NS @ 50 ml/hr (204 kcal).     NUTRITION - FOCUSED PHYSICAL EXAM:  unable to complete at this time.   Diet Order:   Diet Order            Diet NPO time specified  Diet effective now                 EDUCATION NEEDS:   No education needs have been identified at this time  Skin:  Skin Assessment: Reviewed RN Assessment  Last BM:  6/7  Height:   Ht Readings from  Last 1 Encounters:  08/24/19 5' (1.524 m)    Weight:   Wt Readings from Last 1 Encounters:  08/24/19 69.3 kg     Estimated Nutritional Needs:  Kcal:  1940-2150 kcal Protein:  90-100 grams Fluid:  >/= 1.9 L/day     Trenton Gammon, MS, RD, LDN, CNSC Inpatient Clinical Dietitian RD pager # available in AMION  After hours/weekend pager # available in [facility]

## 2019-08-24 NOTE — Progress Notes (Signed)
Hypoglycemic Event  CBG: 68  Treatment: D50 50 mL (25 gm)  Symptoms: None  Follow-up CBG: Time: 1635 CBG Result:103 Possible Reasons for Event: Inadequate meal intake  Comments/MD notified:Dr. Renee Pain, Cathlean Cower

## 2019-08-24 NOTE — Transfer of Care (Signed)
Immediate Anesthesia Transfer of Care Note  Patient: Pamela Dunn  Procedure(s) Performed: LAPAROSCOPY DIAGNOSTIC (N/A ) EXPLORATORY LAPAROTOMY (N/A ) SMALL BOWEL RESECTION (N/A )  Patient Location: PACU  Anesthesia Type:General  Level of Consciousness: awake, alert , oriented and patient cooperative  Airway & Oxygen Therapy: Patient Spontanous Breathing and Patient connected to face mask oxygen  Post-op Assessment: Report given to RN, Post -op Vital signs reviewed and stable and Patient moving all extremities  Post vital signs: Reviewed and stable  Last Vitals:  Vitals Value Taken Time  BP 139/51 08/24/19 1300  Temp    Pulse 83 08/24/19 1301  Resp 27 08/24/19 1301  SpO2 96 % 08/24/19 1301  Vitals shown include unvalidated device data.  Last Pain:  Vitals:   08/24/19 1011  TempSrc: Oral  PainSc:       Patients Stated Pain Goal: 4 (08/24/19 1009)  Complications: No complications documented.

## 2019-08-24 NOTE — Progress Notes (Signed)
PROGRESS NOTE    Pamela Dunn  UTM:[id number] DOB: [birth year] DOA: 08/16/2019 PCP: Pearson Grippe, MD   No chief complaint on file.  Brief Narrative:  Pamela Dunn is Pamela Dunn 86 y.o. female with [place] history of cardiomyopathy, rheumatoid arthritis, hypothyroidism. Patient presents secondary to abdominal pain and found to have Pamela Dunn small bowel obstruction with transition point. General surgery consulted for co-management.   Assessment & Plan:   Principal Problem:   SBO (small bowel obstruction) (HCC) Active Problems:   Hyperlipidemia   GERD   Rheumatoid arthritis (HCC)   Hypothyroidism   Chronic combined systolic and diastolic congestive heart failure (HCC)  Small bowel obstruction CT scan significant for SBO with transition point in the mid abdomen. General surgery consulted. Symptoms initially improved with NG tube but had emesis overnight on 6/11. Second clamping trial on 6/14 with worsening abdominal symptoms and bilious vomiting. CT 6/15 with persistent high grade partial SBO S/p diagnostic laparaoscopy, exploratory laparotomy, small bowel resection on 6/16 by general surgery Continue NGT to LIS Appreciate surgery recommendations  Hypotension Unsure of etiology. No worsening symptoms. Improved with IV fluid bolus and stress dose steroids. Resolved. -Continue maintenance IV fluids  Dental abscess Possible dental abscess. Patient has been dealing with left mandibular infection in the past. She has been treated by her dentist. Currently with purulent drainage from her lower jaw/gum. CT maxillofacial significant for 5 mm abscess -Continue Unasyn -Dentist recommendations: per note from Dr. Kristin Bruins, pt wishes to defer any dental treatment at this time, desires to follow up with her primary dentis  Rheumatoid arthritis On prednisone 5 mg daily as an outpatient. Started on Solu-medrol IV secondary to NPO status -Solu-Cortef IV while NPO  Hypothyroidism -Continue Synthroid 12.5 mcg IV  while NPO  Hyperkalemia Mild. Likely in setting of mild creatinine elevation. Improved  Elevated creatinine Baseline creatinine of 1.1. mildly elevated to 1.24 on admission and trended down with IV fluids. Resolved  History of non-ischemic cardiomyopathy Chronic combined systolic and diastolic heart failure EF of 40-45% from 4/8 with associated grade I diastolic heart dysfunction. Patient is not on diuretic therapy as an outpatient.  -Continue IV fluids to Pamela Dunn rate of 50 ml/hr -Daily weights, I/O  Leukocytosis WBC of 14.5K on admission with worsening to 20.4K in setting of IV solumedrol. Trended down and now resolved.  Urinary retention Patient has required multiple In/Out catheterizations. No urge to urinate. Urine culture with diphtheroids. Foley catheter placed 6/12 -Voiding trial after surgery if patient requires surgical management for SBO; if no surgical management, will attempt voiding trial at that time.  Hypertension Patient is on Bidil, Toprol-XL as an outpatient. BP mostly controlled inpatient. -Continue Labetalol prn  Anemia Downtrend from admission and now stable. No evidence of bleeding -Trend  Hyperlipidemia On Zetia and Coenzyme Q10 as an outpatient.  Hypoglycemia: follow with dextrose containing fluids  DVT prophylaxis: lovenox Code Status: full Family Communication: son at bedside Disposition:   Status is: Inpatient  Remains inpatient appropriate because:Inpatient level of care appropriate due to severity of illness   Dispo: The patient is from: Home              Anticipated d/c is to: pending              Anticipated d/c date is: > 3 days              Patient currently is not medically stable to d/c.  Consultants:   General surgery  Procedures:  S/p diagnostic  laparaoscopy, exploratory laparotomy, small bowel resection on 6/16 by general surgery  Antimicrobials: Anti-infectives (From admission, onward)   Start     Dose/Rate Route  Frequency Ordered Stop   08/24/19 0600  ceFAZolin (ANCEF) IVPB 2g/100 mL premix        2 g 200 mL/hr over 30 Minutes Intravenous On call to O.R. 08/23/19 1622 08/24/19 1202   08/21/19 1000  Ampicillin-Sulbactam (UNASYN) 3 g in sodium chloride 0.9 % 100 mL IVPB     Discontinue     3 g 200 mL/hr over 30 Minutes Intravenous Every 6 hours 08/21/19 0928       Subjective: Uncomfortable after surgery  Objective: Vitals:   08/24/19 1400 08/24/19 1415 08/24/19 1430 08/24/19 1506  BP: (!) 121/52 (!) 127/54 98/60 (!) 111/54  Pulse: 93 91 91 91  Resp: 15 19 17 18   Temp:    97.7 F (36.5 C)  TempSrc:    Axillary  SpO2: 94% 96% 97% 93%  Weight:      Height:        Intake/Output Summary (Last 24 hours) at 08/24/2019 1834 Last data filed at 08/24/2019 1827 Gross per 24 hour  Intake 1846.84 ml  Output 1925 ml  Net -78.16 ml   Filed Weights   08/23/19 0606 08/24/19 0557 08/24/19 1009  Weight: 70.8 kg 69.3 kg 69.3 kg    Examination:  General exam: Appears calm and comfortable  Respiratory system: Clear to auscultation. Respiratory effort normal. Cardiovascular system: S1 & S2 heard, RRR Gastrointestinal system: distended, dressing in place, appropriately tender Central nervous system: Alert and oriented. No focal neurological deficits. Extremities: no LEE Skin: No rashes, lesions or ulcers Psychiatry: Judgement and insight appear normal. Mood & affect appropriate.     Data Reviewed: I have personally reviewed following labs and imaging studies  CBC: Recent Labs  Lab 08/18/19 0552 08/20/19 0705 08/22/19 0503  WBC 16.6* 4.9 7.5  HGB 13.6 10.4* 10.7*  HCT 43.1 33.1* 33.9*  MCV 100.9* 101.8* 99.7  PLT 270 234 782    Basic Metabolic Panel: Recent Labs  Lab 08/18/19 0552 08/20/19 0705 08/22/19 0503 08/24/19 0531  NA 142 144 145 144  K 4.9 4.3 3.5 3.5  CL 108 112* 110 111  CO2 22 24 22  20*  GLUCOSE 123* 119* 85 89  BUN 33* 27* 18 17  CREATININE 1.40* 1.00 0.78 0.94    CALCIUM 8.1* 7.7* 7.1* 6.8*  MG  --  2.6*  --  2.3  PHOS  --  1.8*  --  1.9*    GFR: Estimated Creatinine Clearance: 38 mL/min (by C-G formula based on SCr of 0.94 mg/dL).  Liver Function Tests: Recent Labs  Lab 08/24/19 0531  AST 27  ALT 27  ALKPHOS 45  BILITOT 1.0  PROT 4.8*  ALBUMIN 2.1*    CBG: Recent Labs  Lab 08/23/19 1315 08/24/19 0027 08/24/19 0737 08/24/19 1610 08/24/19 1635  GLUCAP 87 93 71 68* 103*     Recent Results (from the past 240 hour(s))  SARS Coronavirus 2 by RT PCR (hospital order, performed in [facility] hospital lab) Nasopharyngeal Nasopharyngeal Swab     Status: None   Collection Time: 08/17/19  3:00 AM   Specimen: Nasopharyngeal Swab  Result Value Ref Range Status   SARS Coronavirus 2 NEGATIVE NEGATIVE Final    Comment: (NOTE) SARS-CoV-2 target nucleic acids are NOT DETECTED. The SARS-CoV-2 RNA is generally detectable in upper and lower respiratory specimens during the acute phase of infection.  The lowest concentration of SARS-CoV-2 viral copies this assay can detect is 250 copies / mL. Carsynn Bethune negative result does not preclude SARS-CoV-2 infection and should not be used as the sole basis for treatment or other patient management decisions.  Derico Mitton negative result may occur with improper specimen collection / handling, submission of specimen other than nasopharyngeal swab, presence of viral mutation(s) within the areas targeted by this assay, and inadequate number of viral copies (<250 copies / mL). Taysha Majewski negative result must be combined with clinical observations, patient history, and epidemiological information. Fact Sheet for Patients:   BoilerBrush.com.cy Fact Sheet for Healthcare Providers: https://pope.com/ This test is not yet approved or cleared  by the [place] FDA and has been authorized for detection and/or diagnosis of SARS-CoV-2 by FDA under an Emergency Use Authorization (EUA).  This  EUA will remain in effect (meaning this test can be used) for the duration of the COVID-19 declaration under Section 564(b)(1) of the Act, 21 U.S.C. section 360bbb-3(b)(1), unless the authorization is terminated or revoked sooner. Performed at [facility], [address], [place], [place] [address]   Culture, Urine     Status: Abnormal   Collection Time: 08/18/19  5:26 PM   Specimen: Urine, Catheterized  Result Value Ref Range Status   Specimen Description   Final    URINE, CATHETERIZED Performed at [facility], [address], [place], [place] [address]    Special Requests   Final    NONE Performed at [facility], [address], [place], [place] [address]    Culture (Jomes Giraldo)  Final    30,000 COLONIES/mL DIPHTHEROIDS(CORYNEBACTERIUM SPECIES) Standardized susceptibility testing for this organism is not available. Performed at [facility], [address] [place], [place] [address]    Report Status 08/20/2019 FINAL  Final  Surgical pcr screen     Status: Abnormal   Collection Time: 08/24/19  7:31 AM   Specimen: Nasal Mucosa; Nasal Swab  Result Value Ref Range Status   MRSA, PCR NEGATIVE NEGATIVE Final   Staphylococcus aureus POSITIVE (Landen Breeland) NEGATIVE Final    Comment: (NOTE) The Xpert SA Assay (FDA approved for NASAL specimens in patients 53 years of age and older), is one component of Abdikadir Fohl comprehensive surveillance program. It is not intended to diagnose infection nor to guide or monitor treatment. Performed at [facility], [address] [place], [place] [address]          Radiology Studies: CT ABDOMEN PELVIS W CONTRAST  Result Date: 08/23/2019 CLINICAL DATA:  Possible small-bowel obstruction diagnosed on 08/17/2019. 2 bowel movements. Persistent pain. EXAM: CT ABDOMEN AND PELVIS WITH CONTRAST TECHNIQUE: Multidetector CT imaging of the abdomen and pelvis was performed using the standard  protocol following bolus administration of intravenous contrast. CONTRAST:  OMNIPAQUE IOHEXOL 300 MG/ML  SOLN COMPARISON:  Plain film 08/22/2019.  Most recent CT 08/17/2019. FINDINGS: Lower chest: Bibasilar atelectasis. Left lower lobe calcified granuloma. Mild cardiomegaly with multivessel coronary artery atherosclerosis. Small right and trace left pleural effusions, new since the prior CT. Hepatobiliary: Normal liver. Vicarious excretion of contrast via the gallbladder. No biliary duct dilatation. Pancreas: Normal pancreas for age, with atrophy but no acute inflammation. Spleen: Normal in size, without focal abnormality. Adrenals/Urinary Tract: Normal adrenal glands. Bilateral renal cortical thinning. Interpolar left renal too small to characterize lesion, most likely Jsaon Yoo cyst. No hydronephrosis. Foley catheter within the urinary bladder, which is decompressed. Stomach/Bowel: Tiny hiatal hernia. Balin Vandegrift nasogastric tube terminates  at the body of the stomach. The colon is relatively decompressed. Decompressed terminal ileum. Suggestion of 2 adjacent areas of small bowel narrowing/transition, including on coronal images 26 and 33. There is no bowel wall thickening. Persistent moderate interloop mesenteric fluid and edema. No pneumatosis or free intraperitoneal air. Vascular/Lymphatic: Advanced aortic and branch vessel atherosclerosis. No abdominopelvic adenopathy. Reproductive: Uterine atrophy.  No adnexal mass. Other: Small volume abdominopelvic fluid, slightly increased. Musculoskeletal: Osteopenia.  Advanced lumbosacral spondylosis. IMPRESSION: 1. Persistent high-grade partial small bowel obstruction. Suggestion of 2 adjacent areas of small bowel narrowing/transition, as can be seen with closed loop obstruction. Although there is no bowel wall thickening, persistent mesenteric interloop fluid and abdominopelvic ascites, for which complicating ischemia cannot be excluded. 2. Development of small right greater than  left pleural effusions. 3.  Aortic Atherosclerosis (ICD10-I70.0). Electronically Signed   By: Jeronimo Greaves M.D.   On: 08/23/2019 16:06        Scheduled Meds: . Chlorhexidine Gluconate Cloth  6 each Topical Daily  . [START ON 08/25/2019] enoxaparin (LOVENOX) injection  40 mg Subcutaneous Q24H  . glycopyrrolate      . hydrocortisone sod succinate (SOLU-CORTEF) inj  20 mg Intravenous Daily  . HYDROmorphone      . levothyroxine  12.5 mcg Intravenous Daily  . metoprolol tartrate  2.5 mg Intravenous Q6H  . pantoprazole (PROTONIX) IV  40 mg Intravenous QHS   Continuous Infusions: . ampicillin-sulbactam (UNASYN) IV 3 g (08/24/19 1549)  . dextrose 10 % 1,000 mL with sodium chloride 77 mEq infusion 50 mL/hr at 08/24/19 1639     LOS: 7 days    Time spent: over 30 min    Lacretia Nicks, MD [facility]   To contact the attending provider between 7A-7P or the covering provider during after hours 7P-7A, please log into the web site [email or web address] and access using universal [place] password for that web site. If you do not have the password, please call the hospital operator.  08/24/2019, 6:34 PM

## 2019-08-24 NOTE — Progress Notes (Signed)
Pharmacy Antibiotic Note  Pamela Dunn is a 86 y.o. female presented to he ED on 08/16/2019 with c/o abdominal pain and was found to have SBO. Pharmacy was consulted to start unasyn on 6/13 for dental abscess. 08/24/2019 Unasyn D#4 for dental abscess. WBC WNL, AF, SCr WNL.  Persistent SBO> to OR for diagnostic lap & likely tooth extraction in OR by dentist  Plan: - unasyn 3gm IV q6h _____________________________________  Height: 5' (152.4 cm) Weight: 69.3 kg (152 lb 12.8 oz) IBW/kg (Calculated) : 45.5  Temp (24hrs), Avg:98.2 F (36.8 C), Min:97.9 F (36.6 C), Max:98.5 F (36.9 C)  Recent Labs  Lab 08/18/19 0552 08/20/19 0705 08/22/19 0503 08/24/19 0531  WBC 16.6* 4.9 7.5  --   CREATININE 1.40* 1.00 0.78 0.94    Estimated Creatinine Clearance: 38 mL/min (by C-G formula based on SCr of 0.94 mg/dL).    Allergies  Allergen Reactions  . Coreg [Carvedilol] Diarrhea   Antimicrobials this admission:  6/13 unasyn>> Microbiology results:  6/10 UCx: 30k corynebacterium species FINAL  Thank you for allowing pharmacy to be a part of this patient's care.  Herby Abraham, Pharm.D 08/24/2019 7:44 AM

## 2019-08-24 NOTE — Anesthesia Procedure Notes (Signed)
Procedure Name: Awake intubation Performed by: Lucretia Kern, MD Pre-anesthesia Checklist: Patient identified, Emergency Drugs available, Suction available and Patient being monitored Patient Re-evaluated:Patient Re-evaluated prior to induction Tube type: Oral Tube size: 6.5 mm Number of attempts: 1 Airway Equipment and Method: Fiberoptic brochoscope Placement Confirmation: ETT inserted through vocal cords under direct vision and positive ETCO2 Secured at: 21 cm Tube secured with: Tape Dental Injury: Teeth and Oropharynx as per pre-operative assessment  Difficulty Due To: Difficulty was anticipated and Difficult Airway- due to reduced neck mobility Comments: Awake fiberoptic intubation performed due to history of difficult airway which required procedure be aborted + bowel obstruction and aspiration risk. Patient was given 4% lidocaine via nebulizer and then tongue/posterior oropharynx was anesthetized with 4% lidocaine ointment. An intubating oral airway was placed which the patient tolerated well and the fiberoptic scope was advanced easily. The vocal cords were visualized easily and the bronchoscope was passed below the vocal cords and tracheal rings were visualized. The ETT was then advanced over the glidescope into the trachea and trachea was again visualized through the ETT. The fiberoptic scope was removed, the ETT was connected to the vent circuit with +ETCO2. The patient was then induced with propofol once airway was secured.

## 2019-08-24 NOTE — Anesthesia Preprocedure Evaluation (Addendum)
Anesthesia Evaluation  Patient identified by MRN, date of birth, ID band Patient awake    Reviewed: Allergy & Precautions, NPO status , Patient's Chart, lab work & pertinent test results, reviewed documented beta blocker date and time   History of Anesthesia Complications (+) DIFFICULT AIRWAY and history of anesthetic complications  Airway Mallampati: III  TM Distance: >3 FB Neck ROM: Limited    Dental  (+) Teeth Intact   Pulmonary neg pulmonary ROS,    Pulmonary exam normal        Cardiovascular hypertension, Pt. on medications and Pt. on home beta blockers +CHF  Normal cardiovascular exam     Neuro/Psych negative neurological ROS  negative psych ROS   GI/Hepatic Neg liver ROS, GERD  ,Small bowel obstruction   Endo/Other  Hypothyroidism   Renal/GU negative Renal ROS  negative genitourinary   Musculoskeletal  (+) Arthritis , Rheumatoid disorders,    Abdominal   Peds  Hematology  (+) Blood dyscrasia, anemia ,   Anesthesia Other Findings  NICM (EF 40-45%, improved from 20%), RA on chronic prednisone, hypothyroid  Airway Note from [facility] on 05/17/19: "Additional Comments  1) SRNA with Glidescope LoPro 3: 2b View of very bottom of arytenoids  2) SRNA with McGrath 3: Grade 3 view  3) MD with Glidescope LoPro 3: 2b, unable to pass bougie  4) Two MDs with with Glidescope LoPro 3 + Bronchoscope: Unable to pass bronch  5) Two MDs - Bronchoscope with ovassapian airway: View obtained for area under the epiglottis, but too much edema to visualize vocal cords, unable to pass   Decision made to abort as more edema was seen.  Easily masked with OA throughout.  Hemodynamically stable.  Might suggest regular Glidescope with 3 Blade as I think the LoPro was a little long, but LoPro 2 was too short. Versus asleep or awake FOI as first attempt."  Reproductive/Obstetrics                          Anesthesia  Physical Anesthesia Plan  ASA: IV and emergent  Anesthesia Plan: General   Post-op Pain Management:    Induction: Intravenous  PONV Risk Score and Plan: 4 or greater and Ondansetron, Dexamethasone, Treatment may vary due to age or medical condition and Midazolam  Airway Management Planned: Oral ETT, Awake Intubation Planned and Fiberoptic Intubation Planned  Additional Equipment: None  Intra-op Plan:   Post-operative Plan: Extubation in OR  Informed Consent: I have reviewed the patients History and Physical, chart, labs and discussed the procedure including the risks, benefits and alternatives for the proposed anesthesia with the patient or authorized representative who has indicated his/her understanding and acceptance.       Plan Discussed with:   Anesthesia Plan Comments: ( Patient has history of difficult airway for which procedure was aborted in March 2021. Given this history, as well as her bowel obstruction and aspiration risk, I discussed with the patient that the safest way to proceed would be awake fiberoptic intubation. She was amenable to this plan so we will proceed with awake intubation. )      Anesthesia Quick Evaluation

## 2019-08-25 ENCOUNTER — Encounter (HOSPITAL_COMMUNITY): Payer: Self-pay | Admitting: Surgery

## 2019-08-25 ENCOUNTER — Inpatient Hospital Stay: Payer: Self-pay

## 2019-08-25 LAB — GLUCOSE, CAPILLARY
Glucose-Capillary: 163 mg/dL — ABNORMAL HIGH (ref 70–99)
Glucose-Capillary: 122 mg/dL — ABNORMAL HIGH (ref 70–99)
Glucose-Capillary: 134 mg/dL — ABNORMAL HIGH (ref 70–99)

## 2019-08-25 MED ORDER — METHOCARBAMOL 1000 MG/10ML IJ SOLN
500.0000 mg | Freq: Three times a day (TID) | INTRAVENOUS | Status: DC
  Administered 2019-08-25 – 2019-09-01 (×21): 500 mg via INTRAVENOUS
  Filled 2019-08-25 (×5): qty 500
  Filled 2019-08-25 (×2): qty 5
  Filled 2019-08-25 (×2): qty 500
  Filled 2019-08-25: qty 5
  Filled 2019-08-25: qty 500
  Filled 2019-08-25 (×3): qty 5
  Filled 2019-08-25: qty 500
  Filled 2019-08-25: qty 5
  Filled 2019-08-25 (×10): qty 500

## 2019-08-25 MED ORDER — ACETAMINOPHEN 10 MG/ML IV SOLN
1000.0000 mg | Freq: Four times a day (QID) | INTRAVENOUS | Status: AC
  Administered 2019-08-25 – 2019-08-26 (×4): 1000 mg via INTRAVENOUS
  Filled 2019-08-25 (×4): qty 100

## 2019-08-25 NOTE — Progress Notes (Signed)
PROGRESS NOTE    Pamela Dunn  BDZ:[id number] DOB: Mar 06, 1932 DOA: 08/16/2019 PCP: Jani Gravel, MD   No chief complaint on file.  Brief Narrative:  Pamela Dunn is Pamela Dunn 86 y.o. female with Pamela Dunn history of cardiomyopathy, rheumatoid arthritis, hypothyroidism. Patient presents secondary to abdominal pain and found to have Evertte Sones small bowel obstruction with transition point. General surgery consulted for co-management.   Assessment & Plan:   Principal Problem:   SBO (small bowel obstruction) (HCC) Active Problems:   Hyperlipidemia   GERD   Rheumatoid arthritis ([place])   Hypothyroidism   Chronic combined systolic and diastolic congestive heart failure (HCC)  Small bowel obstruction CT scan significant for SBO with transition point in the mid abdomen. General surgery consulted. Symptoms initially improved with NG tube but had emesis overnight on 6/11. Second clamping trial on 6/14 with worsening abdominal symptoms and bilious vomiting. CT 6/15 with persistent high grade partial SBO S/p diagnostic laparaoscopy, exploratory laparotomy, small bowel resection on 6/16 by general surgery Plan to d/c foley tomorrow Continue NGT to LIS - no bowel function yet Appreciate surgery recommendations  Poor Access: no labs today, PICC ordered, attempt labs in afternoon  Hypotension Unsure of etiology. No worsening symptoms. Improved with IV fluid bolus and stress dose steroids. Resolved. -Continue maintenance IV fluids  Dental abscess Possible dental abscess. Patient has been dealing with left mandibular infection in the past. She has been treated by her dentist. Currently with purulent drainage from her lower jaw/gum. CT maxillofacial significant for 5 mm abscess -Continue Unasyn -Dentist recommendations: per note from Dr. Enrique Sack, pt wishes to defer any dental treatment at this time, desires to follow up with her primary dentis  Rheumatoid arthritis On prednisone 5 mg daily as an outpatient. Started  on Solu-medrol IV secondary to NPO status -Solu-Cortef IV while NPO  Hypothyroidism -Continue Synthroid 12.5 mcg IV while NPO  Hyperkalemia Mild. Likely in setting of mild creatinine elevation. Improved  Elevated creatinine Baseline creatinine of 1.1. mildly elevated to 1.24 on admission and trended down with IV fluids. Resolved  History of non-ischemic cardiomyopathy Chronic combined systolic and diastolic heart failure EF of 40-45% from 4/8 with associated grade I diastolic heart dysfunction. Patient is not on diuretic therapy as an outpatient.  -Continue IV fluids to Deno Sida rate of 50 ml/hr -Daily weights, I/O  Leukocytosis WBC of 14.5K on admission with worsening to 20.4K in setting of IV solumedrol. Trended down and now resolved.  Urinary retention Patient has required multiple In/Out catheterizations. No urge to urinate. Urine culture with diphtheroids. Foley catheter placed 6/12 -Voiding trial after surgery if patient requires surgical management for SBO; if no surgical management, will attempt voiding trial at that time.  Hypertension Patient is on Bidil, Toprol-XL as an outpatient. BP mostly controlled inpatient. -Continue Labetalol prn  Anemia Downtrend from admission and now stable. No evidence of bleeding -Trend  Hyperlipidemia On Zetia and Coenzyme Q10 as an outpatient.  Hypoglycemia: follow with dextrose containing fluids  DVT prophylaxis: lovenox Code Status: full Family Communication: son at bedside Disposition:   Status is: Inpatient  Remains inpatient appropriate because:Inpatient level of care appropriate due to severity of illness   Dispo: The patient is from: Home              Anticipated d/c is to: pending              Anticipated d/c date is: > 3 days  Patient currently is not medically stable to d/c.  Consultants:   General surgery  Procedures:  S/p diagnostic laparaoscopy, exploratory laparotomy, small bowel  resection on 6/16 by general surgery  Antimicrobials: Anti-infectives (From admission, onward)   Start     Dose/Rate Route Frequency Ordered Stop   08/24/19 0600  ceFAZolin (ANCEF) IVPB 2g/100 mL premix        2 g 200 mL/hr over 30 Minutes Intravenous On call to O.R. 08/23/19 1622 08/24/19 1202   08/21/19 1000  Ampicillin-Sulbactam (UNASYN) 3 g in sodium chloride 0.9 % 100 mL IVPB     Discontinue     3 g 200 mL/hr over 30 Minutes Intravenous Every 6 hours 08/21/19 0928       Subjective: No new complaints  Objective: Vitals:   08/24/19 2054 08/25/19 0146 08/25/19 0528 08/25/19 1333  BP: 114/60 (!) 144/73 134/64 (!) 146/73  Pulse: 87 90 91 90  Resp: 19 20 20  (!) 22  Temp: 97.7 F (36.5 C) 97.9 F (36.6 C) 97.7 F (36.5 C) 98.2 F (36.8 C)  TempSrc: Oral Oral Oral Oral  SpO2: 100% 99% 98% 100%  Weight:      Height:        Intake/Output Summary (Last 24 hours) at 08/25/2019 1857 Last data filed at 08/25/2019 1730 Gross per 24 hour  Intake 1994.71 ml  Output 375 ml  Net 1619.71 ml   Filed Weights   08/23/19 0606 08/24/19 0557 08/24/19 1009  Weight: 70.8 kg 69.3 kg 69.3 kg    Examination:  General: No acute distress. Cardiovascular: Heart sounds show Kathleena Freeman regular rate, and rhythm. Lungs: Clear to auscultation bilaterally  Abdomen: Soft, approopriately tender, dressing in place Neurological: Alert and oriented 3. Moves all extremities 4 with equal strength. Cranial nerves II through XII grossly intact. Skin: Warm and dry. No rashes or lesions. Extremities: No clubbing or cyanosis. No edema.  Data Reviewed: I have personally reviewed following labs and imaging studies  CBC: Recent Labs  Lab 08/20/19 0705 08/22/19 0503  WBC 4.9 7.5  HGB 10.4* 10.7*  HCT 33.1* 33.9*  MCV 101.8* 99.7  PLT 234 251    Basic Metabolic Panel: Recent Labs  Lab 08/20/19 0705 08/22/19 0503 08/24/19 0531  NA 144 145 144  K 4.3 3.5 3.5  CL 112* 110 111  CO2 24 22 20*  GLUCOSE  119* 85 89  BUN 27* 18 17  CREATININE 1.00 0.78 0.94  CALCIUM 7.7* 7.1* 6.8*  MG 2.6*  --  2.3  PHOS 1.8*  --  1.9*    GFR: Estimated Creatinine Clearance: 38 mL/min (by C-G formula based on SCr of 0.94 mg/dL).  Liver Function Tests: Recent Labs  Lab 08/24/19 0531  AST 27  ALT 27  ALKPHOS 45  BILITOT 1.0  PROT 4.8*  ALBUMIN 2.1*    CBG: Recent Labs  Lab 08/24/19 1610 08/24/19 1635 08/25/19 0736 08/25/19 1129 08/25/19 1646  GLUCAP 68* 103* 163* 122* 134*     Recent Results (from the past 240 hour(s))  SARS Coronavirus 2 by RT PCR (hospital order, performed in [facility] hospital lab) Nasopharyngeal Nasopharyngeal Swab     Status: None   Collection Time: 08/17/19  3:00 AM   Specimen: Nasopharyngeal Swab  Result Value Ref Range Status   SARS Coronavirus 2 NEGATIVE NEGATIVE Final    Comment: (NOTE) SARS-CoV-2 target nucleic acids are NOT DETECTED. The SARS-CoV-2 RNA is generally detectable in upper and lower respiratory specimens during the acute phase  of infection. The lowest concentration of SARS-CoV-2 viral copies this assay can detect is 250 copies / mL. Shauntee Karp negative result does not preclude SARS-CoV-2 infection and should not be used as the sole basis for treatment or other patient management decisions.  Mychal Decarlo negative result may occur with improper specimen collection / handling, submission of specimen other than nasopharyngeal swab, presence of viral mutation(s) within the areas targeted by this assay, and inadequate number of viral copies (<250 copies / mL). Dempsey Knotek negative result must be combined with clinical observations, patient history, and epidemiological information. Fact Sheet for Patients:   BoilerBrush.com.cy Fact Sheet for Healthcare Providers: https://pope.com/ This test is not yet approved or cleared  by the [place] FDA and has been authorized for detection and/or diagnosis of SARS-CoV-2 by FDA  under an Emergency Use Authorization (EUA).  This EUA will remain in effect (meaning this test can be used) for the duration of the COVID-19 declaration under Section 564(b)(1) of the Act, 21 U.S.C. section 360bbb-3(b)(1), unless the authorization is terminated or revoked sooner. Performed at [facility], [address], [place], [place] [address]   Culture, Urine     Status: Abnormal   Collection Time: 08/18/19  5:26 PM   Specimen: Urine, Catheterized  Result Value Ref Range Status   Specimen Description   Final    URINE, CATHETERIZED Performed at [facility], [address], [place], [place] [address]    Special Requests   Final    NONE Performed at [facility], [address], [place], [place] [address]    Culture (Coda Mathey)  Final    30,000 COLONIES/mL DIPHTHEROIDS(CORYNEBACTERIUM SPECIES) Standardized susceptibility testing for this organism is not available. Performed at [facility], [address], [place], [place] [address]    Report Status 08/20/2019 FINAL  Final  Surgical pcr screen     Status: Abnormal   Collection Time: 08/24/19  7:31 AM   Specimen: Nasal Mucosa; Nasal Swab  Result Value Ref Range Status   MRSA, PCR NEGATIVE NEGATIVE Final   Staphylococcus aureus POSITIVE (Darren Nodal) NEGATIVE Final    Comment: (NOTE) The Xpert SA Assay (FDA approved for NASAL specimens in patients 83 years of age and older), is one component of Aarish Rockers comprehensive surveillance program. It is not intended to diagnose infection nor to guide or monitor treatment. Performed at [facility], [address], [place], [place] [address]          Radiology Studies: [place] EKG SITE RITE  Result Date: 08/25/2019 If Site Rite image not attached, placement could not be confirmed due to current cardiac rhythm.       Scheduled Meds: . Chlorhexidine Gluconate Cloth  6 each Topical Daily  . enoxaparin (LOVENOX)  injection  40 mg Subcutaneous Q24H  . hydrocortisone sod succinate (SOLU-CORTEF) inj  20 mg Intravenous Daily  . levothyroxine  12.5 mcg Intravenous Daily  . metoprolol tartrate  2.5 mg Intravenous Q6H  . pantoprazole (PROTONIX) IV  40 mg Intravenous QHS   Continuous Infusions: . acetaminophen 1,000 mg (08/25/19 1729)  . ampicillin-sulbactam (UNASYN) IV 3 g (08/25/19 1502)  . dextrose 10 % 1,000 mL with sodium chloride 77 mEq infusion 50 mL/hr at 08/25/19 1334  . methocarbamol (ROBAXIN) IV 500 mg (08/25/19 1407)     LOS: 8 days    Time spent: over 30 min    Lacretia Nicks, MD [facility]   To contact the attending provider between 7A-7P or  the covering provider during after hours 7P-7A, please log into the web site [email or web address] and access using universal [place] password for that web site. If you do not have the password, please call the hospital operator.  08/25/2019, 6:57 PM

## 2019-08-25 NOTE — Care Management Important Message (Signed)
Important Message  Patient Details IM Letter given to Lanier Clam RN Case Manager to present to the Patient Name: Pamela Dunn MRN: [id number] Date of Birth: 1932-02-17   Medicare Important Message Given:  Yes     Caren Macadam 08/25/2019, 10:39 AM

## 2019-08-25 NOTE — Progress Notes (Signed)
[facility] Progress Note  1 Day Post-Op  Subjective: Patient reports incisional pain, pain medication helps when she needs it. No flatus yet. Having some pain in foot, but discussed trying to move to chair today. Discussed wound care.   Objective: Vital signs in last 24 hours: Temp:  [97.7 F (36.5 C)-97.9 F (36.6 C)] 97.7 F (36.5 C) (06/17 0528) Pulse Rate:  [82-93] 91 (06/17 0528) Resp:  [15-28] 20 (06/17 0528) BP: (98-148)/(47-73) 134/64 (06/17 0528) SpO2:  [93 %-100 %] 98 % (06/17 0528) Last BM Date: 08/18/19  Intake/Output from previous day: 06/16 0701 - 06/17 0700 In: 1746.8 [I.V.:1346.1; IV Piggyback:400.7] Out: 225 [Urine:200; Blood:25] Intake/Output this shift: No intake/output data recorded.  PE: General: pleasant, WD, overweight female who is laying in bed in NAD Heart: regular, rate, and rhythm. Palpable radial and pedal pulses bilaterally Lungs:  Respiratory effort nonlabored Abd: soft, appropriately ttp, ND, midline wound clean with minimal bloody drainage, NGT present with bilious drainage   Lab Results:  No results for input(s): WBC, HGB, HCT, PLT in the last 72 hours. BMET Recent Labs    08/24/19 0531  NA 144  K 3.5  CL 111  CO2 20*  GLUCOSE 89  BUN 17  CREATININE 0.94  CALCIUM 6.8*   PT/INR No results for input(s): LABPROT, INR in the last 72 hours. CMP     Component Value Date/Time   NA 144 08/24/2019 0531   K 3.5 08/24/2019 0531   CL 111 08/24/2019 0531   CO2 20 (L) 08/24/2019 0531   GLUCOSE 89 08/24/2019 0531   BUN 17 08/24/2019 0531   CREATININE 0.94 08/24/2019 0531   CALCIUM 6.8 (L) 08/24/2019 0531   PROT 4.8 (L) 08/24/2019 0531   ALBUMIN 2.1 (L) 08/24/2019 0531   AST 27 08/24/2019 0531   ALT 27 08/24/2019 0531   ALKPHOS 45 08/24/2019 0531   BILITOT 1.0 08/24/2019 0531   GFRNONAA 55 (L) 08/24/2019 0531   GFRAA >60 08/24/2019 0531   Lipase     Component Value Date/Time   LIPASE 48 08/16/2019 2332        Studies/Results: CT ABDOMEN PELVIS W CONTRAST  Result Date: 08/23/2019 CLINICAL DATA:  Possible small-bowel obstruction diagnosed on 08/17/2019. 2 bowel movements. Persistent pain. EXAM: CT ABDOMEN AND PELVIS WITH CONTRAST TECHNIQUE: Multidetector CT imaging of the abdomen and pelvis was performed using the standard protocol following bolus administration of intravenous contrast. CONTRAST:  134mL OMNIPAQUE IOHEXOL 300 MG/ML  SOLN COMPARISON:  Plain film 08/22/2019.  Most recent CT 08/17/2019. FINDINGS: Lower chest: Bibasilar atelectasis. Left lower lobe calcified granuloma. Mild cardiomegaly with multivessel coronary artery atherosclerosis. Small right and trace left pleural effusions, new since the prior CT. Hepatobiliary: Normal liver. Vicarious excretion of contrast via the gallbladder. No biliary duct dilatation. Pancreas: Normal pancreas for age, with atrophy but no acute inflammation. Spleen: Normal in size, without focal abnormality. Adrenals/Urinary Tract: Normal adrenal glands. Bilateral renal cortical thinning. Interpolar left renal too small to characterize lesion, most likely a cyst. No hydronephrosis. Foley catheter within the urinary bladder, which is decompressed. Stomach/Bowel: Tiny hiatal hernia. A nasogastric tube terminates at the body of the stomach. The colon is relatively decompressed. Decompressed terminal ileum. Suggestion of 2 adjacent areas of small bowel narrowing/transition, including on coronal images 26 and 33. There is no bowel wall thickening. Persistent moderate interloop mesenteric fluid and edema. No pneumatosis or free intraperitoneal air. Vascular/Lymphatic: Advanced aortic and branch vessel atherosclerosis. No abdominopelvic adenopathy. Reproductive: Uterine atrophy.  No adnexal mass. Other: Small volume abdominopelvic fluid, slightly increased. Musculoskeletal: Osteopenia.  Advanced lumbosacral spondylosis. IMPRESSION: 1. Persistent high-grade partial small bowel  obstruction. Suggestion of 2 adjacent areas of small bowel narrowing/transition, as can be seen with closed loop obstruction. Although there is no bowel wall thickening, persistent mesenteric interloop fluid and abdominopelvic ascites, for which complicating ischemia cannot be excluded. 2. Development of small right greater than left pleural effusions. 3.  Aortic Atherosclerosis (ICD10-I70.0). Electronically Signed   By: Jeronimo Greaves M.D.   On: 08/23/2019 16:06    Anti-infectives: Anti-infectives (From admission, onward)   Start     Dose/Rate Route Frequency Ordered Stop   08/24/19 0600  ceFAZolin (ANCEF) IVPB 2g/100 mL premix        2 g 200 mL/hr over 30 Minutes Intravenous On call to O.R. 08/23/19 1622 08/24/19 1202   08/21/19 1000  Ampicillin-Sulbactam (UNASYN) 3 g in sodium chloride 0.9 % 100 mL IVPB     Discontinue     3 g 200 mL/hr over 30 Minutes Intravenous Every 6 hours 08/21/19 0928         Assessment/Plan HTN HLD Hypothyroidism GERD PUD Leukocytosis  AKI  Subperiosteal abscess at tooth 20 - defer to medicine  SBO S/P dx laparoscopy, exploratory laparotomy, small bowel resection 08/24/19 Dr. Magnus Ivan - POD#1 - no bowel function yet, keep NGT on LIWS - ok to have ice chips for comfort - mobilize, PT/OT - keep foley today, likely can d/c tomorrow - BID dressing changes to midline - labs pending for today  FEN -IVFs/NGT on LIWS VTE -lovenox ID -unasyn for dental abscess  LOS: 8 days    Juliet Rude , [facility] 08/25/2019, 11:21 AM Please see Amion for pager number during day hours 7:00am-4:30pm

## 2019-08-25 NOTE — Evaluation (Signed)
Physical Therapy Evaluation Patient Details Name: Pamela Dunn MRN: [id number] DOB: 1932-02-09 Today's Date: 08/25/2019   History of Present Illness  86 y.o. female with a history of cardiomyopathy, rheumatoid arthritis, hypothyroidism. Patient presents secondary to abdominal pain and found to have a small bowel obstruction with transition point. General surgery consulted for co-management; s/p ex lap with small bowel resection 08/24/19.  Clinical Impression  Pt admitted with above diagnosis. Pt POD1 from ex-lap small bowel resection requiring mod A to get to EOB and min A to rise. Pt very limited with 3 unsteady steps at bedside. Pt declines transfer to recliner 2* previously being told she didn't have to get over to the chair until tomorrow and is feeling anxious about it currently. Currently recommending SNF due to assistance required with mobility and inability to ambulate significant distances. Pt currently with functional limitations due to the deficits listed below (see PT Problem List). Pt will benefit from skilled PT to increase their independence and safety with mobility to allow discharge to the venue listed below.       Follow Up Recommendations SNF;Supervision/Assistance - 24 hour    Equipment Recommendations  None recommended by PT    Recommendations for Other Services       Precautions / Restrictions Precautions Precautions: Fall Precaution Comments: NG tube Restrictions Weight Bearing Restrictions: No      Mobility  Bed Mobility Overal bed mobility: Needs Assistance Bed Mobility: Supine to Sit;Sit to Supine  Supine to sit: Mod assist Sit to supine: Mod assist  General bed mobility comments: cues for log rolling technique and exhale to protect abdominal incision, mod A to elevate trunk and scoot to EOB, mod A for lifting BLE back into bed  Transfers Overall transfer level: Needs assistance Equipment used: Rolling walker (2 wheeled) Transfers: Sit to/from  Stand Sit to Stand: Min assist;From elevated surface  General transfer comment: min A to power up from EOB, cues to exhale and avoid breath holding, cues for quad/glute activation to assist in rising, steadying assistance upon rising  Ambulation/Gait Ambulation/Gait assistance: Min assist  Assistive device: Rolling walker (2 wheeled)  General Gait Details: 3 unsteady sidesteps at bedside, reliant on RW for steadying, limited 2* L knee pain  Stairs            Wheelchair Mobility    Modified Rankin (Stroke Patients Only)       Balance Overall balance assessment: Needs assistance Sitting-balance support: Feet supported;Bilateral upper extremity supported Sitting balance-Leahy Scale: Fair Sitting balance - Comments: posterior weight shift, cued to use BUE to assist in propped sitting, limited 2* short stature and leaning forward with BUE assisting increased abdominal pain Postural control: Posterior lean Standing balance support: During functional activity;Bilateral upper extremity supported Standing balance-Leahy Scale: Poor Standing balance comment: reliant on RW and min A for dynamic mobility              Pertinent Vitals/Pain Pain Assessment: Faces Faces Pain Scale: Hurts a little bit Pain Location: abdomen/incision with mobility Pain Descriptors / Indicators: Grimacing Pain Intervention(s): Limited activity within patient's tolerance;Monitored during session;Premedicated before session;Repositioned    Home Living Family/patient expects to be discharged to:: Private residence Living Arrangements: Alone Available Help at Discharge: Family;Available PRN/intermittently Type of Home: House Home Access: Ramped entrance     Home Layout: One level Home Equipment: Walker - 2 wheels;Other (comment);Shower seat (rollator)      Prior Function Level of Independence: Independent with assistive device(s)  Comments: community ambulator with rollator, Ind with  ADLs, limited driving, cleaning service for household chores, son provides long distance transportation and grocery shopping     Hand Dominance        Extremity/Trunk Assessment   Upper Extremity Assessment Upper Extremity Assessment: Defer to OT evaluation    Lower Extremity Assessment Lower Extremity Assessment: Generalized weakness (AROM WNL, strength grossly 3+/5)    Cervical / Trunk Assessment Cervical / Trunk Assessment: Normal  Communication   Communication: No difficulties  Cognition Arousal/Alertness: Awake/alert Behavior During Therapy: WFL for tasks assessed/performed Overall Cognitive Status: Within Functional Limits for tasks assessed  General Comments: Pt slightly anxious regarding mobility due to recent surgery, but agreeable with encouragement      General Comments General comments (skin integrity, edema, etc.): on 2L with SpO2 93-96%    Exercises     Assessment/Plan    PT Assessment Patient needs continued PT services  PT Problem List Decreased strength;Decreased activity tolerance;Decreased balance;Decreased mobility;Decreased knowledge of use of DME;Decreased skin integrity;Pain       PT Treatment Interventions DME instruction;Gait training;Functional mobility training;Therapeutic activities;Therapeutic exercise;Balance training;Patient/family education    PT Goals (Current goals can be found in the Care Plan section)  Acute Rehab PT Goals Patient Stated Goal: "get to the chair tomorrow" PT Goal Formulation: With patient Time For Goal Achievement: 09/08/19 Potential to Achieve Goals: Good    Frequency Min 2X/week   Barriers to discharge        Co-evaluation               AM-PAC PT "6 Clicks" Mobility  Outcome Measure Help needed turning from your back to your side while in a flat bed without using bedrails?: A Lot Help needed moving from lying on your back to sitting on the side of a flat bed without using bedrails?: A Lot Help  needed moving to and from a bed to a chair (including a wheelchair)?: A Lot Help needed standing up from a chair using your arms (e.g., wheelchair or bedside chair)?: A Little Help needed to walk in hospital room?: A Lot Help needed climbing 3-5 steps with a railing? : Total 6 Click Score: 12    End of Session Equipment Utilized During Treatment: Oxygen Activity Tolerance: Other (comment);Patient limited by pain (limited by anxiousness with mobility) Patient left: in bed;with call bell/phone within reach;with bed alarm set;with nursing/sitter in room;with family/visitor present Nurse Communication: Mobility status PT Visit Diagnosis: Unsteadiness on feet (R26.81);Other abnormalities of gait and mobility (R26.89);Muscle weakness (generalized) (M62.81);Pain Pain - part of body:  (abdomen)    Time: 4627-0350 PT Time Calculation (min) (ACUTE ONLY): 22 min   Charges:   PT Evaluation $PT Eval Moderate Complexity: 1 Mod          Tori Eldrige Pitkin PT, DPT 08/25/19, 3:40 PM

## 2019-08-26 ENCOUNTER — Inpatient Hospital Stay (HOSPITAL_COMMUNITY): Payer: Medicare PPO

## 2019-08-26 LAB — COMPREHENSIVE METABOLIC PANEL
ALT: 16 U/L (ref 0–44)
AST: 22 U/L (ref 15–41)
Albumin: 1.8 g/dL — ABNORMAL LOW (ref 3.5–5.0)
Alkaline Phosphatase: 59 U/L (ref 38–126)
Anion gap: 11 (ref 5–15)
BUN: 21 mg/dL (ref 8–23)
CO2: 23 mmol/L (ref 22–32)
Calcium: 6.5 mg/dL — ABNORMAL LOW (ref 8.9–10.3)
Chloride: 110 mmol/L (ref 98–111)
Creatinine, Ser: 1.2 mg/dL — ABNORMAL HIGH (ref 0.44–1.00)
GFR calc Af Amer: 48 mL/min — ABNORMAL LOW (ref >60)
GFR calc non Af Amer: 41 mL/min — ABNORMAL LOW (ref >60)
Glucose, Bld: 104 mg/dL — ABNORMAL HIGH (ref 70–99)
Potassium: 2.8 mmol/L — ABNORMAL LOW (ref 3.5–5.1)
Sodium: 144 mmol/L (ref 135–145)
Total Bilirubin: 0.7 mg/dL (ref 0.3–1.2)
Total Protein: 4.8 g/dL — ABNORMAL LOW (ref 6.5–8.1)

## 2019-08-26 LAB — GLUCOSE, CAPILLARY
Glucose-Capillary: 104 mg/dL — ABNORMAL HIGH (ref 70–99)
Glucose-Capillary: 110 mg/dL — ABNORMAL HIGH (ref 70–99)
Glucose-Capillary: 120 mg/dL — ABNORMAL HIGH (ref 70–99)
Glucose-Capillary: 81 mg/dL (ref 70–99)
Glucose-Capillary: 88 mg/dL (ref 70–99)
Glucose-Capillary: 98 mg/dL (ref 70–99)

## 2019-08-26 LAB — BASIC METABOLIC PANEL
Anion gap: 12 (ref 5–15)
BUN: 20 mg/dL (ref 8–23)
CO2: 24 mmol/L (ref 22–32)
Calcium: 7 mg/dL — ABNORMAL LOW (ref 8.9–10.3)
Chloride: 107 mmol/L (ref 98–111)
Creatinine, Ser: 1.08 mg/dL — ABNORMAL HIGH (ref 0.44–1.00)
GFR calc Af Amer: 54 mL/min — ABNORMAL LOW (ref >60)
GFR calc non Af Amer: 47 mL/min — ABNORMAL LOW (ref >60)
Glucose, Bld: 121 mg/dL — ABNORMAL HIGH (ref 70–99)
Potassium: 3.3 mmol/L — ABNORMAL LOW (ref 3.5–5.1)
Sodium: 143 mmol/L (ref 135–145)

## 2019-08-26 LAB — CBC WITH DIFFERENTIAL/PLATELET
Abs Immature Granulocytes: 0.23 10*3/uL — ABNORMAL HIGH (ref 0.00–0.07)
Basophils Absolute: 0.1 10*3/uL (ref 0.0–0.1)
Basophils Relative: 0 %
Eosinophils Absolute: 0 10*3/uL (ref 0.0–0.5)
Eosinophils Relative: 0 %
HCT: 34.7 % — ABNORMAL LOW (ref 36.0–46.0)
Hemoglobin: 10.8 g/dL — ABNORMAL LOW (ref 12.0–15.0)
Immature Granulocytes: 1 %
Lymphocytes Relative: 2 %
Lymphs Abs: 0.6 10*3/uL — ABNORMAL LOW (ref 0.7–4.0)
MCH: 31.4 pg (ref 26.0–34.0)
MCHC: 31.1 g/dL (ref 30.0–36.0)
MCV: 100.9 fL — ABNORMAL HIGH (ref 80.0–100.0)
Monocytes Absolute: 0.4 10*3/uL (ref 0.1–1.0)
Monocytes Relative: 2 %
Neutro Abs: 22.6 10*3/uL — ABNORMAL HIGH (ref 1.7–7.7)
Neutrophils Relative %: 95 %
Platelets: 232 10*3/uL (ref 150–400)
RBC: 3.44 MIL/uL — ABNORMAL LOW (ref 3.87–5.11)
RDW: 15.2 % (ref 11.5–15.5)
WBC: 23.9 10*3/uL — ABNORMAL HIGH (ref 4.0–10.5)
nRBC: 0 % (ref 0.0–0.2)

## 2019-08-26 LAB — SURGICAL PATHOLOGY

## 2019-08-26 LAB — PHOSPHORUS: Phosphorus: 2.2 mg/dL — ABNORMAL LOW (ref 2.5–4.6)

## 2019-08-26 LAB — BRAIN NATRIURETIC PEPTIDE: B Natriuretic Peptide: 721.4 pg/mL — ABNORMAL HIGH (ref 0.0–100.0)

## 2019-08-26 LAB — MAGNESIUM: Magnesium: 2 mg/dL (ref 1.7–2.4)

## 2019-08-26 MED ORDER — SODIUM CHLORIDE 0.9% FLUSH
10.0000 mL | INTRAVENOUS | Status: DC | PRN
  Administered 2019-09-02: 10 mL

## 2019-08-26 MED ORDER — POTASSIUM CHLORIDE 10 MEQ/100ML IV SOLN: 10.0000 meq | INTRAVENOUS | Status: DC

## 2019-08-26 MED ORDER — SODIUM CHLORIDE 0.9% FLUSH
10.0000 mL | Freq: Two times a day (BID) | INTRAVENOUS | Status: DC
  Administered 2019-08-26 – 2019-09-01 (×4): 10 mL

## 2019-08-26 MED ORDER — FUROSEMIDE 10 MG/ML IJ SOLN
40.0000 mg | Freq: Once | INTRAMUSCULAR | Status: AC
  Administered 2019-08-26: 40 mg via INTRAVENOUS
  Filled 2019-08-26: qty 4

## 2019-08-26 MED ORDER — CALCIUM GLUCONATE-NACL 2-0.675 GM/100ML-% IV SOLN
2.0000 g | Freq: Once | INTRAVENOUS | Status: AC
  Administered 2019-08-26: 2000 mg via INTRAVENOUS
  Filled 2019-08-26: qty 100

## 2019-08-26 MED ORDER — POTASSIUM CHLORIDE 10 MEQ/50ML IV SOLN
10.0000 meq | INTRAVENOUS | Status: AC
  Administered 2019-08-26 (×3): 10 meq via INTRAVENOUS
  Filled 2019-08-26 (×3): qty 50

## 2019-08-26 MED ORDER — POTASSIUM PHOSPHATES 15 MMOLE/5ML IV SOLN
20.0000 mmol | Freq: Once | INTRAVENOUS | Status: AC
  Administered 2019-08-26: 20 mmol via INTRAVENOUS
  Filled 2019-08-26: qty 6.67

## 2019-08-26 MED ORDER — ACETAMINOPHEN 10 MG/ML IV SOLN
1000.0000 mg | Freq: Four times a day (QID) | INTRAVENOUS | Status: AC
  Administered 2019-08-26 – 2019-08-27 (×4): 1000 mg via INTRAVENOUS
  Filled 2019-08-26 (×4): qty 100

## 2019-08-26 MED ORDER — POTASSIUM CHLORIDE 10 MEQ/100ML IV SOLN
10.0000 meq | INTRAVENOUS | Status: AC
  Administered 2019-08-26 – 2019-08-27 (×3): 10 meq via INTRAVENOUS
  Filled 2019-08-26 (×3): qty 100

## 2019-08-26 NOTE — Progress Notes (Signed)
Peripherally Inserted Central Catheter Placement  The IV Nurse has discussed with the patient and/or persons authorized to consent for the patient, the purpose of this procedure and the potential benefits and risks involved with this procedure.  The benefits include less needle sticks, lab draws from the catheter, and the patient may be discharged home with the catheter. Risks include, but not limited to, infection, bleeding, blood clot (thrombus formation), and puncture of an artery; nerve damage and irregular heartbeat and possibility to perform a PICC exchange if needed/ordered by physician.  Alternatives to this procedure were also discussed.  Bard Power PICC patient education guide, fact sheet on infection prevention and patient information card has been provided to patient /or left at bedside.    PICC Placement Documentation  PICC Double Lumen 08/26/19 PICC Right Basilic 40 cm 1 cm (Active)  Indication for Insertion or Continuance of Line Poor Vasculature-patient has had multiple peripheral attempts or PIVs lasting less than 24 hours 08/26/19 0900  Exposed Catheter (cm) 1 cm 08/26/19 0900  Site Assessment Clean;Dry;Intact 08/26/19 0900  Lumen #1 Status Flushed;Saline locked;Blood return noted 08/26/19 0900  Lumen #2 Status Flushed;Saline locked;Blood return noted 08/26/19 0900  Dressing Type Transparent;Securing device 08/26/19 0900  Dressing Status Clean;Dry;Intact;Antimicrobial disc in place 08/26/19 0900  Dressing Change Due 09/02/19 08/26/19 0900       Franne Grip Renee 08/26/2019, 9:58 AM

## 2019-08-26 NOTE — Progress Notes (Signed)
Occupational Therapy Evaluation Patient Details Name: Pamela Dunn MRN: [id number] DOB: 27-Sep-1931 Today's Date: 08/26/2019    History of Present Illness 86 y.o. female with a history of cardiomyopathy, rheumatoid arthritis, hypothyroidism. Patient presents secondary to abdominal pain and found to have a small bowel obstruction with transition point. General surgery consulted for co-management; s/p ex lap with small bowel resection 08/24/19.   Clinical Impression   Patient with functional deficits listed below impacting safety and independence with self care. Patient require mod A for bed mobility with cues for sequencing and mod A x2 stand pivot to recliner. Patient with posterior lean during transfer, limited eccentric control with sitting, currently high fall risk. Patient reports she will be going home and her sister who is older than her will be there to help, however at current state patient requiring x2 assist for safety and unsure elderly sister would be able to provide physical assist. Will continue to follow.    Follow Up Recommendations  SNF;Supervision/Assistance - 24 hour    Equipment Recommendations  None recommended by OT (pt home is handicap accessible)       Precautions / Restrictions Precautions Precautions: Fall Precaution Comments: NG tube, weeping R UE Restrictions Weight Bearing Restrictions: No      Mobility Bed Mobility Overal bed mobility: Needs Assistance Bed Mobility: Supine to Sit     Supine to sit: Mod assist;HOB elevated     General bed mobility comments: to mobilize trunk to upright  Transfers Overall transfer level: Needs assistance Equipment used: Rolling walker (2 wheeled) Transfers: Sit to/from Stand Sit to Stand: +2 physical assistance;+2 safety/equipment;Mod assist         General transfer comment: mod cues for body mechanics, mod A x2 for steadying assist due to posterior lean with standing, poor eccentric control into chair     Balance Overall balance assessment: Needs assistance Sitting-balance support: Feet supported;Bilateral upper extremity supported Sitting balance-Leahy Scale: Fair   Postural control: Posterior lean Standing balance support: During functional activity;Bilateral upper extremity supported Standing balance-Leahy Scale: Poor Standing balance comment: reliant on RW                            ADL either performed or assessed with clinical judgement   ADL Overall ADL's : Needs assistance/impaired     Grooming: Set up;Sitting   Upper Body Bathing: Minimal assistance;Sitting   Lower Body Bathing: Moderate assistance;Maximal assistance;Sitting/lateral leans;Sit to/from stand   Upper Body Dressing : Minimal assistance;Sitting   Lower Body Dressing: Maximal assistance;Sit to/from stand;Sitting/lateral leans   Toilet Transfer: Moderate assistance;+2 for physical assistance;+2 for safety/equipment;Stand-pivot;RW;BSC Toilet Transfer Details (indicate cue type and reason): to recliner, cues for body mechanics, posterior lean, limited eccentric control into chair Toileting- Clothing Manipulation and Hygiene: Total assistance       Functional mobility during ADLs: Moderate assistance;+2 for safety/equipment;+2 for physical assistance;Cueing for sequencing;Cueing for safety;Rolling walker General ADL Comments: patient requiring increased assistance with self care due to decreased balance, safety awareness, activity tolerance, pain                   Pertinent Vitals/Pain Pain Assessment: No/denies pain     Hand Dominance Left   Extremity/Trunk Assessment Upper Extremity Assessment Upper Extremity Assessment: Generalized weakness   Lower Extremity Assessment Lower Extremity Assessment: Defer to PT evaluation       Communication Communication Communication: No difficulties   Cognition Arousal/Alertness: Awake/alert Behavior During Therapy: [facility] for tasks  assessed/performed Overall Cognitive Status: Within Functional Limits for tasks assessed                                 General Comments: tangential              Home Living Family/patient expects to be discharged to:: Private residence Living Arrangements: Alone Available Help at Discharge: Family;Available PRN/intermittently Type of Home: House Home Access: Ramped entrance     Home Layout: One level     Bathroom Shower/Tub: Tub/shower unit;Walk-in shower   Bathroom Toilet: Handicapped height     Home Equipment: Environmental consultant - 2 wheels;Other (comment);Shower seat (rollator)          Prior Functioning/Environment Level of Independence: Independent with assistive device(s)        Comments: community ambulator with rollator, Ind with ADLs, limited driving, cleaning service for household chores, son provides long distance transportation and grocery shopping        OT Problem List: Decreased strength;Decreased activity tolerance;Decreased safety awareness;Decreased knowledge of use of DME or AE;Pain;Obesity      OT Treatment/Interventions: Self-care/ADL training;Therapeutic exercise;Energy conservation;DME and/or AE instruction;Therapeutic activities;Patient/family education;Balance training    OT Goals(Current goals can be found in the care plan section) Acute Rehab OT Goals Patient Stated Goal: go home with sister  OT Goal Formulation: With patient Time For Goal Achievement: 09/09/19 Potential to Achieve Goals: Good  OT Frequency: Min 2X/week    AM-PAC OT "6 Clicks" Daily Activity     Outcome Measure Help from another person eating meals?: A Little Help from another person taking care of personal grooming?: A Little Help from another person toileting, which includes using toliet, bedpan, or urinal?: A Lot Help from another person bathing (including washing, rinsing, drying)?: A Lot Help from another person to put on and taking off regular upper body  clothing?: A Little Help from another person to put on and taking off regular lower body clothing?: A Lot 6 Click Score: 15   End of Session Equipment Utilized During Treatment: Rolling walker Nurse Communication: Mobility status  Activity Tolerance: Patient tolerated treatment well Patient left: in chair;with call bell/phone within reach;with chair alarm set  OT Visit Diagnosis: Unsteadiness on feet (R26.81);Other abnormalities of gait and mobility (R26.89);Muscle weakness (generalized) (M62.81);Pain Pain - part of body:  (abdomen)                Time: 8850-2774 OT Time Calculation (min): 25 min Charges:  OT General Charges $OT Visit: 1 Visit OT Evaluation $OT Eval Moderate Complexity: 1 Mod OT Treatments $Self Care/Home Management : 8-22 mins  Marlyce Huge OT Pager: [phone]  Carmelia Roller 08/26/2019, 2:53 PM

## 2019-08-26 NOTE — Progress Notes (Signed)
2 Days Post-Op    CC: Abdominal pain  Subjective: Patient lying in bed her NG is not working.  The soft was clamped off.  Irrigated both ports with air and replaced some reflux valve.  NG is still draining green fluid. She has some rales both sides of her chest.  She just had a PICC line placed.  Midline dressing had a handwritten green on it, but the midline incision looks fine.  She still has a Foley in.  Objective: Vital signs in last 24 hours: Temp:  [97.7 F (36.5 C)-98.2 F (36.8 C)] 98 F (36.7 C) (06/18 0606) Pulse Rate:  [84-90] 84 (06/18 0606) Resp:  [18-22] 18 (06/18 0606) BP: (146-163)/(68-81) 163/81 (06/18 0606) SpO2:  [98 %-100 %] 98 % (06/18 0606) Weight:  [71.6 kg] 71.6 kg (06/18 0632) Last BM Date: 08/18/19 N.p.o. 1994 IV 400 urine recorded 375 NG recorded Afebrile vital signs are stable Creatinine 1.4>>1.0>>0.78>>0.94>>1.20(6/18) WBC 16.6>>4.9>>7.5>>23.9(6/18) BNP 721 CXR pending Intake/Output from previous day: 06/17 0701 - 06/18 0700 In: 1994.7 [I.V.:948.4; IV Piggyback:1046.3] Out: 775 [Urine:400; Emesis/NG output:375] Intake/Output this shift: No intake/output data recorded.  General appearance: alert, cooperative, no distress and Wants to know she can have something to drink. Resp: Bilateral rales. GI: Soft, sore, bowel sounds are hypoactive, midline incision looks clean but there is temperature of green on the dressing.  No flatus or BM so far. Extremities: RA changes.  Lab Results:  Recent Labs    08/26/19 0538  WBC 23.9*  HGB 10.8*  HCT 34.7*  PLT 232    BMET Recent Labs    08/24/19 0531 08/26/19 0538  NA 144 144  K 3.5 2.8*  CL 111 110  CO2 20* 23  GLUCOSE 89 104*  BUN 17 21  CREATININE 0.94 1.20*  CALCIUM 6.8* 6.5*   PT/INR No results for input(s): LABPROT, INR in the last 72 hours.  Recent Labs  Lab 08/24/19 0531 08/26/19 0538  AST 27 22  ALT 27 16  ALKPHOS 45 59  BILITOT 1.0 0.7  PROT 4.8* 4.8*  ALBUMIN 2.1*  1.8*     Lipase     Component Value Date/Time   LIPASE 48 08/16/2019 2332     Medications: . Chlorhexidine Gluconate Cloth  6 each Topical Daily  . enoxaparin (LOVENOX) injection  40 mg Subcutaneous Q24H  . hydrocortisone sod succinate (SOLU-CORTEF) inj  20 mg Intravenous Daily  . levothyroxine  12.5 mcg Intravenous Daily  . metoprolol tartrate  2.5 mg Intravenous Q6H  . pantoprazole (PROTONIX) IV  40 mg Intravenous QHS    Assessment/Plan Rheumatoid arthritis - chronic steroid use CAD -on BiDil/metoprolol Hypokalemia K+ 2.8 - Dr. Florene Glen replacing ? CHF vs aspiration/bilateral pleural effusions R>L - per CXR 08/26/19 HTN HLD Hypothyroidism GERD PUD Leukocytosis - WBC 16.6>>4.9>>7.5>>23.9(6/18) AKI - Creatinine  1.4>>1.0>>0.78>>0.94>>1.20(6/18) Subperiosteal abscess at tooth 20 - defer to medicine   SBO S/P dx laparoscopy, exploratory laparotomy, small bowel resection 08/24/19 Dr. Ninfa Linden - POD#2 - no bowel function yet, keep NGT on LIWS - ok to have ice chips for comfort - mobilize, PT/OT -DC Foley if okay with Dr. Florene Glen - BID dressing changes to midline -Aim to keep potassium at 4.0/defer fluids to Dr. Florene Glen  FEN -IVFs/NGT on LIWS VTE -lovenox ID -Ancef preop; unasyn for dental abscess 6/13>> day 6 Pain: Dilaudid x1; Robaxin 500 mg x 2; resume IV Tylenol  Plan: Continue NG suction for today.  Give her sips and chips from the floor today for oral  comfort.  Mobilize, out of bed to chair and begin ambulation.  PT consult today, OT tomorrow. If okay with Dr. Lowell Guitar we can DC the Foley, she can have a bedside commode.  Continue wet-to-dry dressings twice daily.  Recheck labs in AM.  Continue IV Tylenol    LOS: 9 days    Lumir Demetriou 08/26/2019 Please see Amion

## 2019-08-26 NOTE — Progress Notes (Signed)
PROGRESS NOTE    Pamela Dunn  KZS:[id number] DOB: 1931/03/14 DOA: 08/16/2019 PCP: Pearson Grippe, MD   No chief complaint on file.  Brief Narrative:  Pamela Dunn is Pamela Dunn 86 y.o. female with Pamela Dunn history of cardiomyopathy, rheumatoid arthritis, hypothyroidism. Patient presents secondary to abdominal pain and found to have Anadia Helmes small bowel obstruction with transition point. General surgery consulted for co-management.   Assessment & Plan:   Principal Problem:   SBO (small bowel obstruction) (HCC) Active Problems:   Hyperlipidemia   GERD   Rheumatoid arthritis (HCC)   Hypothyroidism   Chronic combined systolic and diastolic congestive heart failure (HCC)  Small bowel obstruction CT scan significant for SBO with transition point in the mid abdomen. General surgery consulted. Symptoms initially improved with NG tube but had emesis overnight on 6/11. Second clamping trial on 6/14 with worsening abdominal symptoms and bilious vomiting. CT 6/15 with persistent high grade partial SBO S/p diagnostic laparaoscopy, exploratory laparotomy, small bowel resection on 6/16 by general surgery D/c foley today Continue NGT to LIS - sips/chips for oral comfort Appreciate surgery recommendations  Volume Overload History of non-ischemic cardiomyopathy Chronic combined systolic and diastolic heart failure EF of 40-45% from 4/8 with associated grade I diastolic heart dysfunction. Patient is not on diuretic therapy as an outpatient.  -IVF d/c'd -Lasix 40 mg BID today, follow  -Daily weights, I/O - weight up 2.6 kg from admission - diffusely edematous, arms/legs - CXR with mod pulm vascular congestion suggestion CHF as well as small bilateral effusions and bibasilar airspace disease (R>L), atelectasis vs infection  Abnormal Chest X Ray: concern for infection vs aspiration at the right base -> will plan for SLP eval as her diet is advanced.    Poor Access: PICC placed 6/18  Hypotension Unsure of etiology. No  worsening symptoms. Improved with IV fluid bolus and stress dose steroids. Resolved.  Dental abscess Possible dental abscess. Patient has been dealing with left mandibular infection in the past. She has been treated by her dentist. Currently with purulent drainage from her lower jaw/gum. CT maxillofacial significant for 5 mm abscess -Continue Unasyn -Dentist recommendations: per note from Dr. Kristin Bruins, pt wishes to defer any dental treatment at this time, desires to follow up with her primary dentis  Rheumatoid arthritis On prednisone 5 mg daily as an outpatient. Started on Solu-medrol IV secondary to NPO status -Solu-Cortef IV while NPO  Hypothyroidism  -Continue Synthroid 12.5 mcg IV while NPO  Hyperkalemia  Hypokalemia Now hypokalemic, replace and follow   Hypophosphatemia Replace and follow  Elevated creatinine Baseline creatinine of 1.1. mildly elevated to 1.24 on admission and trended down with IV fluids. Resolved  Leukocytosis Worsened after surgery, follow Afebrile On abx for   Urinary retention Patient has required multiple In/Out catheterizations. No urge to urinate. Urine culture with diphtheroids. Foley catheter placed 6/12 -Voiding trial after surgery if patient requires surgical management for SBO; if no surgical management, will attempt voiding trial at that time.  Hypertension Patient is on Bidil, Toprol-XL as an outpatient. BP mostly controlled inpatient. -Continue Labetalol prn  Anemia Downtrend from admission and now stable. No evidence of bleeding -Trend  Hyperlipidemia On Zetia and Coenzyme Q10 as an outpatient.  Hypoglycemia: follow with dextrose containing fluids  DVT prophylaxis: lovenox Code Status: full Family Communication: son at bedside Disposition:   Status is: Inpatient  Remains inpatient appropriate because:Inpatient level of care appropriate due to severity of illness   Dispo: The patient is from: Home  Anticipated d/c is to: pending              Anticipated d/c date is: > 3 days              Patient currently is not medically stable to d/c.  Consultants:   General surgery  Procedures:  S/p diagnostic laparaoscopy, exploratory laparotomy, small bowel resection on 6/16 by general surgery  Antimicrobials: Anti-infectives (From admission, onward)   Start     Dose/Rate Route Frequency Ordered Stop   08/24/19 0600  ceFAZolin (ANCEF) IVPB 2g/100 mL premix        2 g 200 mL/hr over 30 Minutes Intravenous On call to O.R. 08/23/19 1622 08/24/19 1202   08/21/19 1000  Ampicillin-Sulbactam (UNASYN) 3 g in sodium chloride 0.9 % 100 mL IVPB     Discontinue     3 g 200 mL/hr over 30 Minutes Intravenous Every 6 hours 08/21/19 0928       Subjective: No new complaints  Objective: Vitals:   08/25/19 2054 08/26/19 0606 08/26/19 0632 08/26/19 1311  BP: (!) 147/68 (!) 163/81  (!) 158/69  Pulse: 84 84  83  Resp: 18 18  20   Temp: 97.7 F (36.5 C) 98 F (36.7 C)  97.6 F (36.4 C)  TempSrc: Oral   Oral  SpO2: 99% 98%  97%  Weight:   71.6 kg   Height:        Intake/Output Summary (Last 24 hours) at 08/26/2019 1506 Last data filed at 08/26/2019 1448 Gross per 24 hour  Intake 940 ml  Output 2075 ml  Net -1135 ml   Filed Weights   08/24/19 0557 08/24/19 1009 08/26/19 0632  Weight: 69.3 kg 69.3 kg 71.6 kg    Examination:  General: No acute distress. Cardiovascular: Heart sounds show Pamela Dunn regular rate, and rhythm Lungs: Clear to auscultation bilaterally Abdomen: Soft, nontender, nondistended Neurological: Alert and oriented 3. Moves all extremities 4 . Cranial nerves II through XII grossly intact. Skin: Warm and dry. No rashes or lesions. Extremities: bilateral LE edema, upper extremity edema   Data Reviewed: I have personally reviewed following labs and imaging studies  CBC: Recent Labs  Lab 08/20/19 0705 08/22/19 0503 08/26/19 0538  WBC 4.9 7.5 23.9*  NEUTROABS  --   --   22.6*  HGB 10.4* 10.7* 10.8*  HCT 33.1* 33.9* 34.7*  MCV 101.8* 99.7 100.9*  PLT 234 251 232    Basic Metabolic Panel: Recent Labs  Lab 08/20/19 0705 08/22/19 0503 08/24/19 0531 08/26/19 0538  NA 144 145 144 144  K 4.3 3.5 3.5 2.8*  CL 112* 110 111 110  CO2 24 22 20* 23  GLUCOSE 119* 85 89 104*  BUN 27* 18 17 21   CREATININE 1.00 0.78 0.94 1.20*  CALCIUM 7.7* 7.1* 6.8* 6.5*  MG 2.6*  --  2.3 2.0  PHOS 1.8*  --  1.9* 2.2*    GFR: Estimated Creatinine Clearance: 30.2 mL/min (Pamela Dunn) (by C-G formula based on SCr of 1.2 mg/dL (H)).  Liver Function Tests: Recent Labs  Lab 08/24/19 0531 08/26/19 0538  AST 27 22  ALT 27 16  ALKPHOS 45 59  BILITOT 1.0 0.7  PROT 4.8* 4.8*  ALBUMIN 2.1* 1.8*    CBG: Recent Labs  Lab 08/25/19 1646 08/26/19 0006 08/26/19 0408 08/26/19 0954 08/26/19 1315  GLUCAP 134* 104* 120* 98 88     Recent Results (from the past 240 hour(s))  SARS Coronavirus 2 by RT PCR (hospital order, performed in  [facility] hospital lab) Nasopharyngeal Nasopharyngeal Swab     Status: None   Collection Time: 08/17/19  3:00 AM   Specimen: Nasopharyngeal Swab  Result Value Ref Range Status   SARS Coronavirus 2 NEGATIVE NEGATIVE Final    Comment: (NOTE) SARS-CoV-2 target nucleic acids are NOT DETECTED. The SARS-CoV-2 RNA is generally detectable in upper and lower respiratory specimens during the acute phase of infection. The lowest concentration of SARS-CoV-2 viral copies this assay can detect is 250 copies / mL. Pamela Dunn negative result does not preclude SARS-CoV-2 infection and should not be used as the sole basis for treatment or other patient management decisions.  Pamela Dunn negative result may occur with improper specimen collection / handling, submission of specimen other than nasopharyngeal swab, presence of viral mutation(s) within the areas targeted by this assay, and inadequate number of viral copies (<250 copies / mL). Pamela Dunn negative result must be combined with  clinical observations, patient history, and epidemiological information. Fact Sheet for Patients:   StrictlyIdeas.no Fact Sheet for Healthcare Providers: BankingDealers.co.za This test is not yet approved or cleared  by the [place] FDA and has been authorized for detection and/or diagnosis of SARS-CoV-2 by FDA under an Emergency Use Authorization (EUA).  This EUA will remain in effect (meaning this test can be used) for the duration of the COVID-19 declaration under Section 564(b)(1) of the Act, 21 U.S.C. section 360bbb-3(b)(1), unless the authorization is terminated or revoked sooner. Performed at [facility], [place] [address], [place], [place] [address]   Culture, Urine     Status: Abnormal   Collection Time: 08/18/19  5:26 PM   Specimen: Urine, Catheterized  Result Value Ref Range Status   Specimen Description   Final    URINE, CATHETERIZED Performed at [place] [address], [place], [place] [address]    Special Requests   Final    NONE Performed at [facility], [place] [address], [place], [place] [address]    Culture (Pamela Dunn)  Final    30,000 COLONIES/mL DIPHTHEROIDS(CORYNEBACTERIUM SPECIES) Standardized susceptibility testing for this organism is not available. Performed at [facility], [place] [address], [place], [place] [address]    Report Status 08/20/2019 FINAL  Final  Surgical pcr screen     Status: Abnormal   Collection Time: 08/24/19  7:31 AM   Specimen: Nasal Mucosa; Nasal Swab  Result Value Ref Range Status   MRSA, PCR NEGATIVE NEGATIVE Final   Staphylococcus aureus POSITIVE (Pamela Dunn) NEGATIVE Final    Comment: (NOTE) The Xpert SA Assay (FDA approved for NASAL specimens in patients 70 years of age and older), is one component of Pamela Dunn comprehensive surveillance program. It is not intended to diagnose infection nor to guide or monitor treatment. Performed at  [facility], [place] [address], [place], [place] [address]          Radiology Studies: DG CHEST PORT 1 VIEW  Result Date: 08/26/2019 CLINICAL DATA:  Hypoxia. EXAM: PORTABLE CHEST 1 VIEW COMPARISON:  One-view chest x-ray 08/16/2019 FINDINGS: The heart size is exaggerated by low lung volumes. Moderate pulmonary vascular congestion is present. Small effusions are present bilaterally. Bibasilar airspace disease is noted, right greater than left. NG tube courses off the inferior border of the films. Atherosclerotic changes are present at the aortic arch. IMPRESSION: 1. Low lung volumes and moderate pulmonary vascular congestion suggesting congestive heart failure. 2. Small bilateral pleural effusions and bibasilar airspace disease, right greater than left. While this  may represent atelectasis, there is significant concern for infection or aspiration at the right base. Electronically Signed   By: Pamela Dunn M.D.   On: 08/26/2019 08:21   [place] EKG SITE RITE  Result Date: 08/25/2019 If Site Rite image not attached, placement could not be confirmed due to current cardiac rhythm.       Scheduled Meds: . Chlorhexidine Gluconate Cloth  6 each Topical Daily  . enoxaparin (LOVENOX) injection  40 mg Subcutaneous Q24H  . furosemide  40 mg Intravenous Once  . hydrocortisone sod succinate (SOLU-CORTEF) inj  20 mg Intravenous Daily  . levothyroxine  12.5 mcg Intravenous Daily  . metoprolol tartrate  2.5 mg Intravenous Q6H  . pantoprazole (PROTONIX) IV  40 mg Intravenous QHS  . sodium chloride flush  10-40 mL Intracatheter Q12H   Continuous Infusions: . acetaminophen    . ampicillin-sulbactam (UNASYN) IV 3 g (08/26/19 1059)  . methocarbamol (ROBAXIN) IV 500 mg (08/26/19 6659)  . potassium PHOSPHATE IVPB (in mmol) 20 mmol (08/26/19 1134)     LOS: 9 days    Time spent: over 30 min    Pamela Nicks, MD [facility]   To contact the attending provider  between 7A-7P or the covering provider during after hours 7P-7A, please log into the web site [email or web address] and access using universal [place] password for that web site. If you do not have the password, please call the hospital operator.  08/26/2019, 3:06 PM

## 2019-08-27 DIAGNOSIS — L8990 Pressure ulcer of unspecified site, unspecified stage: Secondary | ICD-10-CM | POA: Insufficient documentation

## 2019-08-27 LAB — COMPREHENSIVE METABOLIC PANEL
ALT: 17 U/L (ref 0–44)
AST: 29 U/L (ref 15–41)
Albumin: 1.8 g/dL — ABNORMAL LOW (ref 3.5–5.0)
Alkaline Phosphatase: 79 U/L (ref 38–126)
Anion gap: 10 (ref 5–15)
BUN: 19 mg/dL (ref 8–23)
CO2: 26 mmol/L (ref 22–32)
Calcium: 6.5 mg/dL — ABNORMAL LOW (ref 8.9–10.3)
Chloride: 102 mmol/L (ref 98–111)
Creatinine, Ser: 1.15 mg/dL — ABNORMAL HIGH (ref 0.44–1.00)
GFR calc Af Amer: 50 mL/min — ABNORMAL LOW (ref >60)
GFR calc non Af Amer: 43 mL/min — ABNORMAL LOW (ref >60)
Glucose, Bld: 75 mg/dL (ref 70–99)
Potassium: 3.1 mmol/L — ABNORMAL LOW (ref 3.5–5.1)
Sodium: 138 mmol/L (ref 135–145)
Total Bilirubin: 1 mg/dL (ref 0.3–1.2)
Total Protein: 4.6 g/dL — ABNORMAL LOW (ref 6.5–8.1)

## 2019-08-27 LAB — GLUCOSE, CAPILLARY
Glucose-Capillary: 70 mg/dL (ref 70–99)
Glucose-Capillary: 67 mg/dL — ABNORMAL LOW (ref 70–99)
Glucose-Capillary: 82 mg/dL (ref 70–99)
Glucose-Capillary: 97 mg/dL (ref 70–99)
Glucose-Capillary: 90 mg/dL (ref 70–99)

## 2019-08-27 LAB — CBC
HCT: 32.4 % — ABNORMAL LOW (ref 36.0–46.0)
Hemoglobin: 10.3 g/dL — ABNORMAL LOW (ref 12.0–15.0)
MCH: 30.8 pg (ref 26.0–34.0)
MCHC: 31.8 g/dL (ref 30.0–36.0)
MCV: 97 fL (ref 80.0–100.0)
Platelets: 253 10*3/uL (ref 150–400)
RBC: 3.34 MIL/uL — ABNORMAL LOW (ref 3.87–5.11)
RDW: 15 % (ref 11.5–15.5)
WBC: 22.3 10*3/uL — ABNORMAL HIGH (ref 4.0–10.5)
nRBC: 0 % (ref 0.0–0.2)

## 2019-08-27 LAB — MAGNESIUM: Magnesium: 1.8 mg/dL (ref 1.7–2.4)

## 2019-08-27 LAB — PHOSPHORUS: Phosphorus: 3 mg/dL (ref 2.5–4.6)

## 2019-08-27 MED ORDER — POTASSIUM CHLORIDE 10 MEQ/100ML IV SOLN
10.0000 meq | INTRAVENOUS | Status: AC
  Administered 2019-08-27 (×2): 10 meq via INTRAVENOUS
  Filled 2019-08-27: qty 100

## 2019-08-27 MED ORDER — DEXTROSE 10 % IV SOLN: INTRAVENOUS | Status: DC

## 2019-08-27 MED ORDER — POTASSIUM CHLORIDE 10 MEQ/100ML IV SOLN
10.0000 meq | INTRAVENOUS | Status: AC
  Administered 2019-08-27 (×4): 10 meq via INTRAVENOUS
  Filled 2019-08-27 (×4): qty 100

## 2019-08-27 MED ORDER — FUROSEMIDE 10 MG/ML IJ SOLN
40.0000 mg | Freq: Two times a day (BID) | INTRAMUSCULAR | Status: DC
  Administered 2019-08-27 – 2019-08-31 (×8): 40 mg via INTRAVENOUS
  Filled 2019-08-27 (×8): qty 4

## 2019-08-27 MED ORDER — PANTOPRAZOLE SODIUM 40 MG IV SOLR
40.0000 mg | Freq: Once | INTRAVENOUS | Status: AC
  Administered 2019-08-27: 40 mg via INTRAVENOUS
  Filled 2019-08-27: qty 40

## 2019-08-27 MED ORDER — ALUM & MAG HYDROXIDE-SIMETH 200-200-20 MG/5ML PO SUSP
30.0000 mL | Freq: Once | ORAL | Status: AC
  Administered 2019-08-27: 30 mL via ORAL
  Filled 2019-08-27: qty 30

## 2019-08-27 MED ORDER — LIDOCAINE VISCOUS HCL 2 % MT SOLN
15.0000 mL | Freq: Once | OROMUCOSAL | Status: AC
  Administered 2019-08-27: 15 mL via ORAL
  Filled 2019-08-27: qty 15

## 2019-08-27 MED ORDER — FUROSEMIDE 10 MG/ML IJ SOLN
40.0000 mg | Freq: Once | INTRAMUSCULAR | Status: AC
  Administered 2019-08-27: 40 mg via INTRAVENOUS
  Filled 2019-08-27: qty 4

## 2019-08-27 MED ORDER — ALUM & MAG HYDROXIDE-SIMETH 200-200-20 MG/5ML PO SUSP
15.0000 mL | Freq: Four times a day (QID) | ORAL | Status: DC | PRN
  Administered 2019-08-28: 15 mL via ORAL
  Filled 2019-08-27 (×3): qty 30

## 2019-08-27 NOTE — Progress Notes (Addendum)
PROGRESS NOTE    Pamela Dunn  LFY:[id number] DOB: [birth year] DOA: 08/16/2019 PCP: Pearson Grippe, MD   No chief complaint on file.  Brief Narrative:  Pamela Dunn is Pamela Dunn 86 y.o. female with Pamela Dunn history of cardiomyopathy, rheumatoid arthritis, hypothyroidism. Patient presents secondary to abdominal pain and found to have Pamela Dunn small bowel obstruction with transition point. General surgery consulted for co-management.   Assessment & Plan:   Principal Problem:   SBO (small bowel obstruction) (HCC) Active Problems:   Hyperlipidemia   GERD   Rheumatoid arthritis (HCC)   Hypothyroidism   Chronic combined systolic and diastolic congestive heart failure (HCC)   Pressure injury of skin  Small bowel obstruction CT scan significant for SBO with transition point in the mid abdomen. General surgery consulted. Symptoms initially improved with NG tube but had emesis overnight on 6/11. Second clamping trial on 6/14 with worsening abdominal symptoms and bilious vomiting. CT 6/15 with persistent high grade partial SBO S/p diagnostic laparaoscopy, exploratory laparotomy, small bowel resection on 6/16 by general surgery D/c foley today Continue NGT to LIS - sips/chips for oral comfort Appreciate surgery recommendations  Volume Overload History of non-ischemic cardiomyopathy Chronic combined systolic and diastolic heart failure EF of 40-45% from 4/8 with associated grade I diastolic heart dysfunction. Patient is not on diuretic therapy as an outpatient.  -Lasix 40 mg BID today, follow  -Daily weights, I/O - weight up 2.6 kg from admission - negative 1.8 L - diffusely edematous, arms/legs - CXR with mod pulm vascular congestion suggestion CHF as well as small bilateral effusions and bibasilar airspace disease (R>L), atelectasis vs infection - repeat CXR 6/10  Abnormal Chest X Ray: concern for infection vs aspiration at the right base -> will plan for SLP eval as her diet is advanced.    Poor Access: PICC  placed 6/18  Hypotension Unsure of etiology. No worsening symptoms. Improved with IV fluid bolus and stress dose steroids. Resolved.  Dental abscess Possible dental abscess. Patient has been dealing with left mandibular infection in the past. She has been treated by her dentist. Currently with purulent drainage from her lower jaw/gum. CT maxillofacial significant for 5 mm abscess -Continue Unasyn - day 7 -Dentist recommendations: per note from Dr. Kristin Bruins, pt wishes to defer any dental treatment at this time, desires to follow up with her primary dentis  Rheumatoid arthritis On prednisone 5 mg daily as an outpatient. Started on Solu-medrol IV secondary to NPO status -Solu-Cortef IV while NPO  Hypothyroidism  -Continue Synthroid 12.5 mcg IV while NPO  Hyperkalemia   Hypokalemia Now hypokalemic, replace and follow   Hypophosphatemia Replace and follow  Elevated creatinine Baseline creatinine of 1.1. 1.2 on 6/18, 1.15 today.  Follow with diuresis.  Leukocytosis Worsened after surgery, follow Afebrile On abx for dental abscess   Urinary retention Patient has required multiple In/Out catheterizations. No urge to urinate. Urine culture with diphtheroids. Foley catheter placed 6/12 - Foley discontinued 6/18  Hypertension Patient is on Bidil, Toprol-XL as an outpatient. BP mostly controlled inpatient. -Continue Labetalol prn  Anemia Downtrend from admission and now stable. No evidence of bleeding -Trend  Hyperlipidemia On Zetia and Coenzyme Q10 as an outpatient.  Hypoglycemia: follow with dextrose containing fluids  Reflux: continue PPI, maalox  DVT prophylaxis: lovenox Code Status: full Family Communication: son at bedside 6/19 Disposition:   Status is: Inpatient  Remains inpatient appropriate because:Inpatient level of care appropriate due to severity of illness   Dispo: The patient is from: Home  Anticipated d/c is to: pending               Anticipated d/c date is: > 3 days              Patient currently is not medically stable to d/c.  Consultants:   General surgery  Procedures:  S/p diagnostic laparaoscopy, exploratory laparotomy, small bowel resection on 6/16 by general surgery  Antimicrobials: Anti-infectives (From admission, onward)   Start     Dose/Rate Route Frequency Ordered Stop   08/24/19 0600  ceFAZolin (ANCEF) IVPB 2g/100 mL premix        2 g 200 mL/hr over 30 Minutes Intravenous On call to O.R. 08/23/19 1622 08/24/19 1202   08/21/19 1000  Ampicillin-Sulbactam (UNASYN) 3 g in sodium chloride 0.9 % 100 mL IVPB     Discontinue     3 g 200 mL/hr over 30 Minutes Intravenous Every 6 hours 08/21/19 0928       Subjective: C/o reflux last night  Objective: Vitals:   08/26/19 1311 08/26/19 2100 08/27/19 0554 08/27/19 1405  BP: (!) 158/69 118/89 104/67 (!) 156/100  Pulse: 83 80 66 72  Resp: 20   16  Temp: 97.6 F (36.4 C) 98.9 F (37.2 C) 98.7 F (37.1 C) 97.7 F (36.5 C)  TempSrc: Oral Oral Oral Oral  SpO2: 97% 96% 94% 99%  Weight:      Height:        Intake/Output Summary (Last 24 hours) at 08/27/2019 1506 Last data filed at 08/27/2019 1419 Gross per 24 hour  Intake 467 ml  Output 3325 ml  Net -2858 ml   Filed Weights   08/24/19 0557 08/24/19 1009 08/26/19 0632  Weight: 69.3 kg 69.3 kg 71.6 kg    Examination:  General: No acute distress. Cardiovascular: Heart sounds show Pamela Dunn regular rate, and rhythm Lungs: Clear to auscultation bilaterally  Abdomen: Soft, nontender, nondistended Neurological: Alert and oriented 3. Moves all extremities 4. Cranial nerves II through XII grossly intact. Skin: Warm and dry. No rashes or lesions. Extremities: upper extremity edema    Data Reviewed: I have personally reviewed following labs and imaging studies  CBC: Recent Labs  Lab 08/22/19 0503 08/26/19 0538 08/27/19 0345  WBC 7.5 23.9* 22.3*  NEUTROABS  --  22.6*  --   HGB 10.7* 10.8* 10.3*    HCT 33.9* 34.7* 32.4*  MCV 99.7 100.9* 97.0  PLT 251 232 253    Basic Metabolic Panel: Recent Labs  Lab 08/22/19 0503 08/24/19 0531 08/26/19 0538 08/26/19 1545 08/27/19 0345  NA 145 144 144 143 138  K 3.5 3.5 2.8* 3.3* 3.1*  CL 110 111 110 107 102  CO2 22 20* 23 24 26   GLUCOSE 85 89 104* 121* 75  BUN 18 17 21 20 19   CREATININE 0.78 0.94 1.20* 1.08* 1.15*  CALCIUM 7.1* 6.8* 6.5* 7.0* 6.5*  MG  --  2.3 2.0  --  1.8  PHOS  --  1.9* 2.2*  --  3.0    GFR: Estimated Creatinine Clearance: 31.6 mL/min (Toula Miyasaki) (by C-G formula based on SCr of 1.15 mg/dL (H)).  Liver Function Tests: Recent Labs  Lab 08/24/19 0531 08/26/19 0538 08/27/19 0345  AST 27 22 29   ALT 27 16 17   ALKPHOS 45 59 79  BILITOT 1.0 0.7 1.0  PROT 4.8* 4.8* 4.6*  ALBUMIN 2.1* 1.8* 1.8*    CBG: Recent Labs  Lab 08/26/19 1639 08/26/19 2021 08/27/19 0450 08/27/19 0736 08/27/19 1213  GLUCAP 110*  81 70 67* 82     Recent Results (from the past 240 hour(s))  Culture, Urine     Status: Abnormal   Collection Time: 08/18/19  5:26 PM   Specimen: Urine, Catheterized  Result Value Ref Range Status   Specimen Description   Final    URINE, CATHETERIZED Performed at [facility] [address], [place], [place] [address]    Special Requests   Final    NONE Performed at [facility], [place] [address], [place], [place] [address]    Culture (Neko Boyajian)  Final    30,000 COLONIES/mL DIPHTHEROIDS(CORYNEBACTERIUM SPECIES) Standardized susceptibility testing for this organism is not available. Performed at [facility], [place] [address], [place], [place] [address]    Report Status 08/20/2019 FINAL  Final  Surgical pcr screen     Status: Abnormal   Collection Time: 08/24/19  7:31 AM   Specimen: Nasal Mucosa; Nasal Swab  Result Value Ref Range Status   MRSA, PCR NEGATIVE NEGATIVE Final   Staphylococcus aureus POSITIVE (Davin Muramoto) NEGATIVE Final    Comment: (NOTE) The Xpert SA Assay (FDA  approved for NASAL specimens in patients 5 years of age and older), is one component of Farren Landa comprehensive surveillance program. It is not intended to diagnose infection nor to guide or monitor treatment. Performed at [facility], [place] [address] [place], [place] [address]          Radiology Studies: DG CHEST PORT 1 VIEW  Result Date: 08/26/2019 CLINICAL DATA:  Hypoxia. EXAM: PORTABLE CHEST 1 VIEW COMPARISON:  One-view chest x-ray 08/16/2019 FINDINGS: The heart size is exaggerated by low lung volumes. Moderate pulmonary vascular congestion is present. Small effusions are present bilaterally. Bibasilar airspace disease is noted, right greater than left. NG tube courses off the inferior border of the films. Atherosclerotic changes are present at the aortic arch. IMPRESSION: 1. Low lung volumes and moderate pulmonary vascular congestion suggesting congestive heart failure. 2. Small bilateral pleural effusions and bibasilar airspace disease, right greater than left. While this may represent atelectasis, there is significant concern for infection or aspiration at the right base. Electronically Signed   By: San Morelle M.D.   On: 08/26/2019 08:21        Scheduled Meds:  Chlorhexidine Gluconate Cloth  6 each Topical Daily   enoxaparin (LOVENOX) injection  40 mg Subcutaneous Q24H   furosemide  40 mg Intravenous BID   hydrocortisone sod succinate (SOLU-CORTEF) inj  20 mg Intravenous Daily   levothyroxine  12.5 mcg Intravenous Daily   metoprolol tartrate  2.5 mg Intravenous Q6H   pantoprazole (PROTONIX) IV  40 mg Intravenous QHS   sodium chloride flush  10-40 mL Intracatheter Q12H   Continuous Infusions:  ampicillin-sulbactam (UNASYN) IV 3 g (08/27/19 1143)   dextrose 30 mL/hr at 08/27/19 0809   methocarbamol (ROBAXIN) IV 500 mg (08/27/19 1412)   potassium chloride 10 mEq (08/27/19 1415)   potassium chloride       LOS: 10 days    Time spent:  over 30 min    Fayrene Helper, MD [facility]   To contact the attending provider between 7A-7P or the covering provider during after hours 7P-7A, please log into the web site [email or web address] and access using universal [place] password for that web site. If you do not have the password, please call the hospital operator.  08/27/2019, 3:06 PM

## 2019-08-27 NOTE — Progress Notes (Signed)
Pt reports passing flatus x2. Indigestion minimal with Maalox added. 2x up to the recliner, pt tolerated fair. Will continue to monitor patient.

## 2019-08-27 NOTE — Progress Notes (Signed)
3 Days Post-Op   Subjective/Chief Complaint:  HEARTBURN  Pt with heartburn  Bad night  No flatus     Objective: Vital signs in last 24 hours: Temp:  [97.6 F (36.4 C)-98.9 F (37.2 C)] 98.7 F (37.1 C) (06/19 0554) Pulse Rate:  [66-83] 66 (06/19 0554) Resp:  [20] 20 (06/18 1311) BP: (104-158)/(67-89) 104/67 (06/19 0554) SpO2:  [94 %-97 %] 94 % (06/19 0554) Last BM Date:  (pta)  Intake/Output from previous day: 06/18 0701 - 06/19 0700 In: 1407 [P.O.:447; I.V.:10; IV Piggyback:950] Out: 3725 [Urine:2925; Emesis/NG output:800] Intake/Output this shift: No intake/output data recorded.   General appearance: alert, cooperative, no distress and Wants to know she can have something to drink. Resp: Bilateral rales. GI: Soft, sore, bowel sounds are hypoactive, midline incision looks clean but there is temperature of green on the dressing.  No flatus or BM so far. Extremities: RA changes. Lab Results:  Recent Labs    08/26/19 0538 08/27/19 0345  WBC 23.9* 22.3*  HGB 10.8* 10.3*  HCT 34.7* 32.4*  PLT 232 253   BMET Recent Labs    08/26/19 1545 08/27/19 0345  NA 143 138  K 3.3* 3.1*  CL 107 102  CO2 24 26  GLUCOSE 121* 75  BUN 20 19  CREATININE 1.08* 1.15*  CALCIUM 7.0* 6.5*   PT/INR No results for input(s): LABPROT, INR in the last 72 hours. ABG No results for input(s): PHART, HCO3 in the last 72 hours.  Invalid input(s): PCO2, PO2  Studies/Results: DG CHEST PORT 1 VIEW  Result Date: 08/26/2019 CLINICAL DATA:  Hypoxia. EXAM: PORTABLE CHEST 1 VIEW COMPARISON:  One-view chest x-ray 08/16/2019 FINDINGS: The heart size is exaggerated by low lung volumes. Moderate pulmonary vascular congestion is present. Small effusions are present bilaterally. Bibasilar airspace disease is noted, right greater than left. NG tube courses off the inferior border of the films. Atherosclerotic changes are present at the aortic arch. IMPRESSION: 1. Low lung volumes and moderate  pulmonary vascular congestion suggesting congestive heart failure. 2. Small bilateral pleural effusions and bibasilar airspace disease, right greater than left. While this may represent atelectasis, there is significant concern for infection or aspiration at the right base. Electronically Signed   By: Marin Roberts M.D.   On: 08/26/2019 08:21   [place] EKG SITE RITE  Result Date: 08/25/2019 If Site Rite image not attached, placement could not be confirmed due to current cardiac rhythm.   Anti-infectives: Anti-infectives (From admission, onward)   Start     Dose/Rate Route Frequency Ordered Stop   08/24/19 0600  ceFAZolin (ANCEF) IVPB 2g/100 mL premix        2 g 200 mL/hr over 30 Minutes Intravenous On call to O.R. 08/23/19 1622 08/24/19 1202   08/21/19 1000  Ampicillin-Sulbactam (UNASYN) 3 g in sodium chloride 0.9 % 100 mL IVPB     Discontinue     3 g 200 mL/hr over 30 Minutes Intravenous Every 6 hours 08/21/19 0928        Assessment/Plan: s/p Procedure(s): LAPAROSCOPY DIAGNOSTIC (N/A) EXPLORATORY LAPAROTOMY (N/A) SMALL BOWEL RESECTION (N/A) Rheumatoid arthritis - chronic steroid use CAD -on BiDil/metoprolol Hypokalemia K+ 2.8 - Dr. Lowell Guitar replacing ? CHF vs aspiration/bilateral pleural effusions R>L - per CXR 08/26/19 HTN HLD Hypothyroidism GERD PUD Leukocytosis - WBC 16.6>>4.9>>7.5>>23.9(6/18) AKI - Creatinine  1.4>>1.0>>0.78>>0.94>>1.20(6/18) Subperiosteal abscess at tooth 20 - defer to medicine   SBO S/P dx laparoscopy, exploratory laparotomy, small bowel resection 08/24/19 Dr. Magnus Ivan - POD#3 - no bowel function  yet, keep NGT on LIWS - ok to have ice chips hard candy or gum / pop sickle  for comfort - mobilize, PT/OT - BID dressing changes to midline -Aim to keep potassium at 4.0/defer fluids to Dr. Florene Glen  FEN -IVFs/NGTon LIWS VTE -lovenox ID -Ancef preop; unasyn for dental abscess 6/13>> day 6 Pain: Dilaudid x1; Robaxin 500 mg x 2; resume IV  Tylenol  Plan: Continue NG suction for today.  Give her sips and chips from the floor today for oral comfort.  Mobilize, out of bed to chair and begin ambulation.  PT consult today, OT tomorrow. If okay with Dr. Florene Glen we can DC the Foley, she can have a bedside commode.  Continue wet-to-dry dressings twice daily.  Recheck labs in AM.  Continue IV Tylenol  LOS: 10 days    Turner Daniels MD  08/27/2019

## 2019-08-27 NOTE — Progress Notes (Signed)
Pharmacy Antibiotic Note  Pamela Dunn is a 86 y.o. female presented to he ED on 08/16/2019 with c/o abdominal pain and was found to have SBO. Pharmacy was consulted to start unasyn on 6/13 for dental abscess. 08/27/2019 Unasyn D#7 for dental abscess. WBC up to 22.3 - on solucortef , AF, SCr up to 1.15, CrCl ~ 31.6 ml/min  POD#3 exp lap. SBR  Plan: - unasyn 3gm IV q6h ? Length of therapy? _____________________________________  Height: 5' (152.4 cm) Weight: 71.6 kg (157 lb 13.6 oz) IBW/kg (Calculated) : 45.5  Temp (24hrs), Avg:98.8 F (37.1 C), Min:98.7 F (37.1 C), Max:98.9 F (37.2 C)  Recent Labs  Lab 08/22/19 0503 08/24/19 0531 08/26/19 0538 08/26/19 1545 08/27/19 0345  WBC 7.5  --  23.9*  --  22.3*  CREATININE 0.78 0.94 1.20* 1.08* 1.15*    Estimated Creatinine Clearance: 31.6 mL/min (A) (by C-G formula based on SCr of 1.15 mg/dL (H)).    Allergies  Allergen Reactions  . Coreg [Carvedilol] Diarrhea   Antimicrobials this admission:  6/13 unasyn>> Microbiology results:  6/10 UCx: 30k corynebacterium species FINAL 6/16 surgical PCR: MRSA neg, Staph aureus positive   Thank you for allowing pharmacy to be a part of this patient's care.  Herby Abraham, Pharm.D 08/27/2019 1:26 PM

## 2019-08-27 NOTE — Evaluation (Signed)
Clinical/Bedside Swallow Evaluation Patient Details  Name: Pamela Dunn MRN: [id number] Date of Birth: 1932-02-13  Today's Date: 08/27/2019 Time: SLP Start Time (ACUTE ONLY): 0902 SLP Stop Time (ACUTE ONLY): 0930 SLP Time Calculation (min) (ACUTE ONLY): 28 min  Past Medical History:  Past Medical History:  Diagnosis Date  . Allergy    seasonal  . Anemia   . Arthritis   . GERD (gastroesophageal reflux disease)   . Hyperlipidemia   . Hypertension    Past Surgical History:  Past Surgical History:  Procedure Laterality Date  . APPENDECTOMY    . BACK SURGERY    . BOWEL RESECTION N/A 08/24/2019   Procedure: SMALL BOWEL RESECTION;  Surgeon: Abigail Miyamoto, MD;  Location: [facility];  Service: General;  Laterality: N/A;  . LAPAROSCOPY N/A 08/24/2019   Procedure: LAPAROSCOPY DIAGNOSTIC;  Surgeon: Abigail Miyamoto, MD;  Location: [facility];  Service: General;  Laterality: N/A;  . LAPAROTOMY N/A 08/24/2019   Procedure: EXPLORATORY LAPAROTOMY;  Surgeon: Abigail Miyamoto, MD;  Location: [facility];  Service: General;  Laterality: N/A;   HPI:  86 y.o. female with history of cardiomyopathy, rheumatoid arthritis, hypothyroidism presents to the ER with complaint of sudden onset of abdominal pain on 08/15/19.  Had multiple episodes of vomiting. During hospitalization, s/p laparoscopy, exploratory laparoscopy, small bowel resection 08/24/19 w/ NGT placed; L mandibular abscess noted in hx; CXR on 08/26/19 indicated Low lung volumes and moderate pulmonary vascular congestion suggesting congestive heart failure. 2. Small bilateral pleural effusions and bibasilar airspace disease, right greater than left. While this may represent atelectasis, there is significant concern for infection or aspiration at the right base.  Assessment / Plan / Recommendation Clinical Impression  Pt presents with primarily pharyngoesophageal dysphagia with symptoms including regurgitation, belching, multiple swallows and odynophagia noted  during intake of ice chips, thin via straw and italian ice; swallow timely with ice chips and thin liquids via straw, but pt c/o above GERD symptoms post-intake with all consistencies; NPO currently with oral care prior to intake with recent surgeon note indicating "ice chips, hard candy/gum or popsicles for comfort" allowed; Pt is at mild-moderate risk for aspiration post-swallow d/t esophageal symptoms/medical status; ST will f/u for diet progression as pt is at risk for aspiration; thank you for this consult. SLP Visit Diagnosis: Dysphagia, pharyngoesophageal phase (R13.14)    Aspiration Risk  Mild aspiration risk;Moderate aspiration risk;Risk for inadequate nutrition/hydration    Diet Recommendation   NPO primarily (w/ ice chips, thin liquids and hard candy/gum or popsicles for comfort/pleasure per MD note)  Medication Administration: Via alternative means    Other  Recommendations Oral Care Recommendations: Oral care QID;Oral care prior to ice chip/H20;Oral care before and after PO   Follow up Recommendations Other (comment) (TBD)      Frequency and Duration min 2x/week  1 week       Prognosis Prognosis for Safe Diet Advancement: Good      Swallow Study   General Date of Onset: 08/16/19 HPI: 86 y.o. female with history of cardiomyopathy, rheumatoid arthritis, hypothyroidism presents to the ER with complaint of sudden onset of abdominal pain since yesterday evening.  Had multiple episodes of vomiting. Type of Study: Bedside Swallow Evaluation Previous Swallow Assessment:  (n/a) Diet Prior to this Study: NPO Temperature Spikes Noted: No Respiratory Status: Nasal cannula History of Recent Intubation: Yes Length of Intubations (days): 1 days (surgery only) Date extubated: 08/24/19 (only intubated for surgery) Behavior/Cognition: Alert;Cooperative;Pleasant mood Oral Cavity Assessment: Dry Oral Care Completed by  SLP: Yes Oral Cavity - Dentition: Adequate natural dentition;Missing  dentition Vision: Functional for self-feeding Self-Feeding Abilities: Able to feed self;Needs assist Patient Positioning: Upright in bed Baseline Vocal Quality: Low vocal intensity Volitional Cough: Strong Volitional Swallow: Able to elicit    Oral/Motor/Sensory Function Overall Oral Motor/Sensory Function: Within functional limits   Ice Chips Ice chips: Within functional limits Presentation: Spoon   Thin Liquid Thin Liquid: Impaired Presentation: Straw Other Comments:  (GERD symptoms after intake; "It's coming back up"; pain)    Nectar Thick Nectar Thick Liquid: Not tested   Honey Thick Honey Thick Liquid: Not tested   Puree Puree: Not tested   Solid     Solid: Not tested      Elvina Sidle, M.S., CCC-SLP 08/27/2019,10:28 AM

## 2019-08-28 ENCOUNTER — Inpatient Hospital Stay (HOSPITAL_COMMUNITY): Payer: Medicare PPO

## 2019-08-28 LAB — COMPREHENSIVE METABOLIC PANEL
ALT: 23 U/L (ref 0–44)
AST: 38 U/L (ref 15–41)
Albumin: 1.8 g/dL — ABNORMAL LOW (ref 3.5–5.0)
Alkaline Phosphatase: 91 U/L (ref 38–126)
Anion gap: 13 (ref 5–15)
BUN: 23 mg/dL (ref 8–23)
CO2: 25 mmol/L (ref 22–32)
Calcium: 6.6 mg/dL — ABNORMAL LOW (ref 8.9–10.3)
Chloride: 102 mmol/L (ref 98–111)
Creatinine, Ser: 1.12 mg/dL — ABNORMAL HIGH (ref 0.44–1.00)
GFR calc Af Amer: 52 mL/min — ABNORMAL LOW (ref >60)
GFR calc non Af Amer: 45 mL/min — ABNORMAL LOW (ref >60)
Glucose, Bld: 90 mg/dL (ref 70–99)
Potassium: 3.4 mmol/L — ABNORMAL LOW (ref 3.5–5.1)
Sodium: 140 mmol/L (ref 135–145)
Total Bilirubin: 0.9 mg/dL (ref 0.3–1.2)
Total Protein: 4.9 g/dL — ABNORMAL LOW (ref 6.5–8.1)

## 2019-08-28 LAB — CBC WITH DIFFERENTIAL/PLATELET
Abs Immature Granulocytes: 0.97 10*3/uL — ABNORMAL HIGH (ref 0.00–0.07)
Basophils Absolute: 0.1 10*3/uL (ref 0.0–0.1)
Basophils Relative: 0 %
Eosinophils Absolute: 0.3 10*3/uL (ref 0.0–0.5)
Eosinophils Relative: 2 %
HCT: 34.9 % — ABNORMAL LOW (ref 36.0–46.0)
Hemoglobin: 11.2 g/dL — ABNORMAL LOW (ref 12.0–15.0)
Immature Granulocytes: 6 %
Lymphocytes Relative: 6 %
Lymphs Abs: 0.9 10*3/uL (ref 0.7–4.0)
MCH: 31.4 pg (ref 26.0–34.0)
MCHC: 32.1 g/dL (ref 30.0–36.0)
MCV: 97.8 fL (ref 80.0–100.0)
Monocytes Absolute: 0.3 10*3/uL (ref 0.1–1.0)
Monocytes Relative: 2 %
Neutro Abs: 12.9 10*3/uL — ABNORMAL HIGH (ref 1.7–7.7)
Neutrophils Relative %: 84 %
Platelets: 288 10*3/uL (ref 150–400)
RBC: 3.57 MIL/uL — ABNORMAL LOW (ref 3.87–5.11)
RDW: 14.9 % (ref 11.5–15.5)
WBC: 15.4 10*3/uL — ABNORMAL HIGH (ref 4.0–10.5)
nRBC: 0.1 % (ref 0.0–0.2)

## 2019-08-28 LAB — GLUCOSE, CAPILLARY
Glucose-Capillary: 100 mg/dL — ABNORMAL HIGH (ref 70–99)
Glucose-Capillary: 102 mg/dL — ABNORMAL HIGH (ref 70–99)
Glucose-Capillary: 111 mg/dL — ABNORMAL HIGH (ref 70–99)
Glucose-Capillary: 85 mg/dL (ref 70–99)
Glucose-Capillary: 90 mg/dL (ref 70–99)
Glucose-Capillary: 91 mg/dL (ref 70–99)

## 2019-08-28 LAB — PHOSPHORUS: Phosphorus: 2.7 mg/dL (ref 2.5–4.6)

## 2019-08-28 LAB — MAGNESIUM: Magnesium: 1.8 mg/dL (ref 1.7–2.4)

## 2019-08-28 MED ORDER — POTASSIUM CHLORIDE 10 MEQ/100ML IV SOLN
10.0000 meq | INTRAVENOUS | Status: AC
  Administered 2019-08-28 (×5): 10 meq via INTRAVENOUS
  Filled 2019-08-28 (×3): qty 100

## 2019-08-28 NOTE — Progress Notes (Signed)
Physical Therapy Treatment Patient Details Name: Pamela Dunn MRN: [id number] DOB: 05-08-1931 Today's Date: 08/28/2019    History of Present Illness 86 y.o. female with a history of cardiomyopathy, rheumatoid arthritis, hypothyroidism. Patient presents secondary to abdominal pain and found to have a small bowel obstruction with transition point. General surgery consulted for co-management; s/p ex lap with small bowel resection 08/24/19.    PT Comments    Pt is cooperative and progressing with mobility but slowly and with increased time and significant physical assist to perform all basic mobility tasks.  Pt would benefit from follow up rehab at SNF level to maximize IND and safety prior to return home.   Follow Up Recommendations  SNF;Supervision/Assistance - 24 hour     Equipment Recommendations  None recommended by PT    Recommendations for Other Services       Precautions / Restrictions Precautions Precautions: Fall Precaution Comments: weeping R UE Restrictions Weight Bearing Restrictions: No    Mobility  Bed Mobility Overal bed mobility: Needs Assistance Bed Mobility: Sit to Supine       Sit to supine: Min assist;+2 for physical assistance;+2 for safety/equipment   General bed mobility comments: to bring LEs up onto bed and to control trunk  Transfers Overall transfer level: Needs assistance Equipment used: Rolling walker (2 wheeled) Transfers: Sit to/from Stand Sit to Stand: +2 physical assistance;+2 safety/equipment;Mod assist;From elevated surface         General transfer comment: assist to bring wt up and fwd and to balance in standing with upright RW.    Ambulation/Gait Ambulation/Gait assistance: Min assist;+2 physical assistance;+2 safety/equipment Gait Distance (Feet): 4 Feet Assistive device: Bilateral platform walker Gait Pattern/deviations: Step-to pattern;Decreased step length - right;Decreased step length - left;Shuffle Gait velocity: decr    General Gait Details: cues for posture, position from RW and sequence.  Physical assist for balance/support and to steady walker with front swivel wheels   Stairs             Wheelchair Mobility    Modified Rankin (Stroke Patients Only)       Balance Overall balance assessment: Needs assistance Sitting-balance support: Feet supported;Bilateral upper extremity supported Sitting balance-Leahy Scale: Good Sitting balance - Comments: posterior weight shift, cued to use BUE to assist in propped sitting, limited 2* short stature and leaning forward with BUE assisting increased abdominal pain Postural control: Posterior lean Standing balance support: During functional activity;Bilateral upper extremity supported Standing balance-Leahy Scale: Poor Standing balance comment: reliant on RW and external support                            Cognition Arousal/Alertness: Awake/alert Behavior During Therapy: WFL for tasks assessed/performed Overall Cognitive Status: Within Functional Limits for tasks assessed                                 General Comments: cues to refocus on task at hand      Exercises      General Comments        Pertinent Vitals/Pain Pain Assessment: No/denies pain    Home Living                      Prior Function            PT Goals (current goals can now be found in the care plan section) Acute Rehab  PT Goals Patient Stated Goal: go home with sister  PT Goal Formulation: With patient Time For Goal Achievement: 09/08/19 Potential to Achieve Goals: Good Progress towards PT goals: Progressing toward goals    Frequency    Min 2X/week      PT Plan Current plan remains appropriate    Co-evaluation              AM-PAC PT "6 Clicks" Mobility   Outcome Measure  Help needed turning from your back to your side while in a flat bed without using bedrails?: A Lot Help needed moving from lying on your back  to sitting on the side of a flat bed without using bedrails?: A Lot Help needed moving to and from a bed to a chair (including a wheelchair)?: A Lot Help needed standing up from a chair using your arms (e.g., wheelchair or bedside chair)?: A Lot Help needed to walk in hospital room?: A Lot Help needed climbing 3-5 steps with a railing? : Total 6 Click Score: 11    End of Session Equipment Utilized During Treatment: Gait belt Activity Tolerance: Patient limited by fatigue Patient left: in bed;with call bell/phone within reach;with nursing/sitter in room Nurse Communication: Mobility status PT Visit Diagnosis: Unsteadiness on feet (R26.81);Other abnormalities of gait and mobility (R26.89);Muscle weakness (generalized) (M62.81);Pain     Time: 1700-1749 PT Time Calculation (min) (ACUTE ONLY): 24 min  Charges:  $Gait Training: 8-22 mins $Therapeutic Activity: 8-22 mins                     Pamela Dunn PT Acute Rehabilitation Services Pager [phone] Office [phone]    Pamela Dunn 08/28/2019, 4:05 PM

## 2019-08-28 NOTE — Progress Notes (Signed)
Physical Therapy Treatment Patient Details Name: Pamela Dunn MRN: [id number] DOB: 29-Apr-1931 Today's Date: 08/28/2019    History of Present Illness 86 y.o. female with a history of cardiomyopathy, rheumatoid arthritis, hypothyroidism. Patient presents secondary to abdominal pain and found to have a small bowel obstruction with transition point. General surgery consulted for co-management; s/p ex lap with small bowel resection 08/24/19.    PT Comments    Pt is cooperative and is progressing with mobility but slowly and with increased time and significant physical assist to perform all basic mobility tasks.  Pt would benefit from follow up rehab at SNF level to maximize IND and safety prior to return home.   Follow Up Recommendations  SNF;Supervision/Assistance - 24 hour     Equipment Recommendations  None recommended by PT    Recommendations for Other Services       Precautions / Restrictions Precautions Precautions: Fall Precaution Comments: weeping R UE Restrictions Weight Bearing Restrictions: No    Mobility  Bed Mobility Overal bed mobility: Needs Assistance Bed Mobility: Supine to Sit     Supine to sit: Mod assist     General bed mobility comments: to complete modified roll and bring trunk to upright  Transfers Overall transfer level: Needs assistance Equipment used: Rolling walker (2 wheeled) Transfers: Sit to/from Stand Sit to Stand: +2 physical assistance;+2 safety/equipment;Mod assist;From elevated surface         General transfer comment: assist to bring wt up and fwd and to balance in standing with upright RW.    Ambulation/Gait Ambulation/Gait assistance: Min assist;+2 physical assistance;+2 safety/equipment Gait Distance (Feet): 11 Feet Assistive device: Bilateral platform walker Gait Pattern/deviations: Step-to pattern;Decreased step length - right;Decreased step length - left;Shuffle Gait velocity: decr   General Gait Details: cues for posture,  position from RW and sequence.  Physical assist for balance/support and to steady walker with front swivel wheels   Stairs             Wheelchair Mobility    Modified Rankin (Stroke Patients Only)       Balance Overall balance assessment: Needs assistance Sitting-balance support: Feet supported;Bilateral upper extremity supported Sitting balance-Leahy Scale: Good     Standing balance support: During functional activity;Bilateral upper extremity supported Standing balance-Leahy Scale: Poor Standing balance comment: reliant on RW                             Cognition Arousal/Alertness: Awake/alert Behavior During Therapy: WFL for tasks assessed/performed Overall Cognitive Status: Within Functional Limits for tasks assessed                                 General Comments: cues to refocus on task at hand      Exercises      General Comments        Pertinent Vitals/Pain Pain Assessment: No/denies pain    Home Living                      Prior Function            PT Goals (current goals can now be found in the care plan section) Acute Rehab PT Goals Patient Stated Goal: go home with sister  PT Goal Formulation: With patient Time For Goal Achievement: 09/08/19 Potential to Achieve Goals: Good Progress towards PT goals: Progressing toward goals    Frequency  Min 2X/week      PT Plan Current plan remains appropriate    Co-evaluation              AM-PAC PT "6 Clicks" Mobility   Outcome Measure  Help needed turning from your back to your side while in a flat bed without using bedrails?: A Lot Help needed moving from lying on your back to sitting on the side of a flat bed without using bedrails?: A Lot Help needed moving to and from a bed to a chair (including a wheelchair)?: A Lot Help needed standing up from a chair using your arms (e.g., wheelchair or bedside chair)?: A Lot Help needed to walk in  hospital room?: A Lot Help needed climbing 3-5 steps with a railing? : Total 6 Click Score: 11    End of Session Equipment Utilized During Treatment: Gait belt Activity Tolerance: Patient limited by fatigue Patient left: in chair;with call bell/phone within reach;with chair alarm set Nurse Communication: Mobility status PT Visit Diagnosis: Unsteadiness on feet (R26.81);Other abnormalities of gait and mobility (R26.89);Muscle weakness (generalized) (M62.81);Pain     Time: 8366-2947 PT Time Calculation (min) (ACUTE ONLY): 24 min  Charges:  $Gait Training: 8-22 mins $Therapeutic Activity: 8-22 mins                     Mauro Kaufmann PT Acute Rehabilitation Services Pager [phone] Office [phone]    Linna Thebeau 08/28/2019, 3:57 PM

## 2019-08-28 NOTE — Progress Notes (Signed)
4 Days Post-Op   Subjective/Chief Complaint:  PASSING GAS    Objective: Vital signs in last 24 hours: Temp:  [97.7 F (36.5 C)-98.9 F (37.2 C)] 98 F (36.7 C) (06/20 0418) Pulse Rate:  [72-84] 82 (06/20 0418) Resp:  [16] 16 (06/19 1405) BP: (100-156)/(84-100) 100/84 (06/20 0418) SpO2:  [96 %-99 %] 96 % (06/20 0418) Last BM Date:  (pta)  Intake/Output from previous day: 06/19 0701 - 06/20 0700 In: 1345.2 [P.O.:225; I.V.:520.1; IV Piggyback:600.1] Out: 1600 [Urine:1500; Emesis/NG output:100] Intake/Output this shift: No intake/output data recorded.   General appearance:alert, cooperative, no distress andWants to know she can have something to drink. Resp:Bilateral rales. VQ:MGQQ, sore, bowel sounds are hypoactive, midline incision looks clean but there is temperature of green on the dressing. No flatus or BM so far. Extremities:RA changes. Lab Results:  Recent Labs    08/27/19 0345 08/28/19 0353  WBC 22.3* 15.4*  HGB 10.3* 11.2*  HCT 32.4* 34.9*  PLT 253 288   BMET Recent Labs    08/27/19 0345 08/28/19 0353  NA 138 140  K 3.1* 3.4*  CL 102 102  CO2 26 25  GLUCOSE 75 90  BUN 19 23  CREATININE 1.15* 1.12*  CALCIUM 6.5* 6.6*   PT/INR No results for input(s): LABPROT, INR in the last 72 hours. ABG No results for input(s): PHART, HCO3 in the last 72 hours.  Invalid input(s): PCO2, PO2  Studies/Results: No results found.  Anti-infectives: Anti-infectives (From admission, onward)   Start     Dose/Rate Route Frequency Ordered Stop   08/24/19 0600  ceFAZolin (ANCEF) IVPB 2g/100 mL premix        2 g 200 mL/hr over 30 Minutes Intravenous On call to O.R. 08/23/19 1622 08/24/19 1202   08/21/19 1000  Ampicillin-Sulbactam (UNASYN) 3 g in sodium chloride 0.9 % 100 mL IVPB     Discontinue     3 g 200 mL/hr over 30 Minutes Intravenous Every 6 hours 08/21/19 0928        Assessment/Plan: s/p Procedure(s): LAPAROSCOPY DIAGNOSTIC (N/A) EXPLORATORY  LAPAROTOMY (N/A) SMALL BOWEL RESECTION (N/A) SBO S/P dx laparoscopy, exploratory laparotomy, small bowel resection 08/24/19 Dr. Magnus Ivan - POD#4 - flatus d/c NGT  - CLEAR LIQUIDS  - mobilize, PT/OT - BID dressing changes to midline -Aim to keep potassium at 4.0/defer fluids to Dr. Lowell Guitar  FEN -IVFs/NGTon LIWS VTE -lovenox ID -Ancef preop;unasyn for dental abscess6/13>> day 6 Pain: Dilaudid x1; Robaxin 500 mg x 2; resume IV Tylenol  LOS: 11 days    Dortha Schwalbe MD  08/28/2019

## 2019-08-28 NOTE — Progress Notes (Signed)
PROGRESS NOTE    [name]  LOV:[id number] DOB: 02/03/1932 DOA: 08/16/2019 PCP: Pearson Grippe, MD   No chief complaint on file.  Brief Narrative:  [name] is Pamela Dunn 86 y.o. female with Tyler Cubit history of cardiomyopathy, rheumatoid arthritis, hypothyroidism. Patient presents secondary to abdominal pain and found to have Wheeler Incorvaia small bowel obstruction with transition point. General surgery consulted for co-management.   Assessment & Plan:   Principal Problem:   SBO (small bowel obstruction) (HCC) Active Problems:   Hyperlipidemia   GERD   Rheumatoid arthritis (HCC)   Hypothyroidism   Chronic combined systolic and diastolic congestive heart failure (HCC)   Pressure injury of skin  Small bowel obstruction CT scan significant for SBO with transition point in the mid abdomen. General surgery consulted. Symptoms initially improved with NG tube but had emesis overnight on 6/11. Second clamping trial on 6/14 with worsening abdominal symptoms and bilious vomiting. CT 6/15 with persistent high grade partial SBO S/p diagnostic laparaoscopy, exploratory laparotomy, small bowel resection on 6/16 by general surgery D/c NG, clear liquids, will follow  Appreciate surgery recommendations  Volume Overload History of non-ischemic cardiomyopathy Chronic combined systolic and diastolic heart failure EF of 40-45% from 4/8 with associated grade I diastolic heart dysfunction. Patient is not on diuretic therapy as an outpatient.  -Lasix 40 mg BID today, follow  -Daily weights, I/O - weight up 2.6 kg from admission - negative 1.8 L - diffusely edematous, arms/legs - CXR with mod pulm vascular congestion suggestion CHF as well as small bilateral effusions and bibasilar airspace disease (R>L), atelectasis vs infection - repeat CXR 6/10  Abnormal Chest X Ray: concern for infection vs aspiration at the right base -> will plan for SLP eval as her diet is advanced.   Repeat CXR today with persistent opacity in the R  base - infiltrate/pneumonia vs atelectasis -> f/u to resolution  Follow SLP eval  Poor Access: PICC placed 6/18  Hypotension Unsure of etiology. No worsening symptoms. Improved with IV fluid bolus and stress dose steroids. Resolved.  Dental abscess Possible dental abscess. Patient has been dealing with left mandibular infection in the past. She has been treated by her dentist. Currently with purulent drainage from her lower jaw/gum. CT maxillofacial significant for 5 mm abscess -Continue Unasyn - day 8 -Dentist recommendations: per note from Dr. Kristin Bruins, pt wishes to defer any dental treatment at this time, desires to follow up with her primary dentis  Rheumatoid arthritis On prednisone 5 mg daily as an outpatient. Started on Solu-medrol IV secondary to NPO status -Solu-Cortef IV while NPO  Hypothyroidism  -Continue Synthroid 12.5 mcg IV while NPO  Hyperkalemia   Hypokalemia Now hypokalemic, replace and follow   Hypophosphatemia Replace and follow  Elevated creatinine Baseline creatinine of 1.1. 1.2 on 6/18, 1.12 today.  Follow with diuresis.  Leukocytosis Improved today, continue to monitor Afebrile On abx for dental abscess   Urinary retention Patient has required multiple In/Out catheterizations. No urge to urinate. Urine culture with diphtheroids. Foley catheter placed 6/12 - Foley discontinued 6/18  Hypertension Patient is on Bidil, Toprol-XL as an outpatient. BP mostly controlled inpatient. -Continue Labetalol prn  Anemia Downtrend from admission and now stable. No evidence of bleeding -Trend  Hyperlipidemia On Zetia and Coenzyme Q10 as an outpatient.  Hypoglycemia: follow with dextrose containing fluids  Reflux: continue PPI, maalox  DVT prophylaxis: lovenox Code Status: full Family Communication: son at bedside 6/19 Disposition:   Status is: Inpatient  Remains inpatient appropriate  because:Inpatient level of care appropriate due to  severity of illness   Dispo: The patient is from: Home              Anticipated d/c is to: pending              Anticipated d/c date is: > 3 days              Patient currently is not medically stable to d/c.  Consultants:   General surgery  Procedures:  S/p diagnostic laparaoscopy, exploratory laparotomy, small bowel resection on 6/16 by general surgery  Antimicrobials: Anti-infectives (From admission, onward)   Start     Dose/Rate Route Frequency Ordered Stop   08/24/19 0600  ceFAZolin (ANCEF) IVPB 2g/100 mL premix        2 g 200 mL/hr over 30 Minutes Intravenous On call to O.R. 08/23/19 1622 08/24/19 1202   08/21/19 1000  Ampicillin-Sulbactam (UNASYN) 3 g in sodium chloride 0.9 % 100 mL IVPB     Discontinue     3 g 200 mL/hr over 30 Minutes Intravenous Every 6 hours 08/21/19 0928       Subjective: No new complaints Trying clears.  Asking about advancing diet.   Objective: Vitals:   08/27/19 1405 08/27/19 2132 08/28/19 0418 08/28/19 1149  BP: (!) 156/100 108/89 100/84 (!) 156/80  Pulse: 72 84 82 92  Resp: 16     Temp: 97.7 F (36.5 C) 98.9 F (37.2 C) 98 F (36.7 C) (!) 97.4 F (36.3 C)  TempSrc: Oral Oral Oral Oral  SpO2: 99% 98% 96% 99%  Weight:      Height:        Intake/Output Summary (Last 24 hours) at 08/28/2019 1808 Last data filed at 08/28/2019 1630 Gross per 24 hour  Intake 1395.16 ml  Output 1500 ml  Net -104.84 ml   Filed Weights   08/24/19 0557 08/24/19 1009 08/26/19 0632  Weight: 69.3 kg 69.3 kg 71.6 kg    Examination:  General: No acute distress. Cardiovascular: Heart sounds show Leon Goodnow regular rate, and rhythm Lungs: Clear to auscultation bilaterally  Abdomen: Soft, nontender, nondistended  Neurological: Alert and oriented 3. Moves all extremities 4 . Cranial nerves II through XII grossly intact. Skin: Warm and dry. No rashes or lesions. Extremities: bilateral upper extremity > LE edema    Data Reviewed: I have personally reviewed  following labs and imaging studies  CBC: Recent Labs  Lab 08/22/19 0503 08/26/19 0538 08/27/19 0345 08/28/19 0353  WBC 7.5 23.9* 22.3* 15.4*  NEUTROABS  --  22.6*  --  12.9*  HGB 10.7* 10.8* 10.3* 11.2*  HCT 33.9* 34.7* 32.4* 34.9*  MCV 99.7 100.9* 97.0 97.8  PLT 251 232 253 341    Basic Metabolic Panel: Recent Labs  Lab 08/24/19 0531 08/26/19 0538 08/26/19 1545 08/27/19 0345 08/28/19 0353  NA 144 144 143 138 140  K 3.5 2.8* 3.3* 3.1* 3.4*  CL 111 110 107 102 102  CO2 20* 23 24 26 25   GLUCOSE 89 104* 121* 75 90  BUN 17 21 20 19 23   CREATININE 0.94 1.20* 1.08* 1.15* 1.12*  CALCIUM 6.8* 6.5* 7.0* 6.5* 6.6*  MG 2.3 2.0  --  1.8 1.8  PHOS 1.9* 2.2*  --  3.0 2.7    GFR: Estimated Creatinine Clearance: 32.4 mL/min (Nitin Mckowen) (by C-G formula based on SCr of 1.12 mg/dL (H)).  Liver Function Tests: Recent Labs  Lab 08/24/19 0531 08/26/19 0538 08/27/19 0345 08/28/19 0353  AST  27 22 29  38  ALT 27 16 17 23   ALKPHOS 45 59 79 91  BILITOT 1.0 0.7 1.0 0.9  PROT 4.8* 4.8* 4.6* 4.9*  ALBUMIN 2.1* 1.8* 1.8* 1.8*    CBG: Recent Labs  Lab 08/28/19 0021 08/28/19 0417 08/28/19 0726 08/28/19 1219 08/28/19 1606  GLUCAP 85 90 91 100* 111*     Recent Results (from the past 240 hour(s))  Surgical pcr screen     Status: Abnormal   Collection Time: 08/24/19  7:31 AM   Specimen: Nasal Mucosa; Nasal Swab  Result Value Ref Range Status   MRSA, PCR NEGATIVE NEGATIVE Final   Staphylococcus aureus POSITIVE (Kveon Casanas) NEGATIVE Final    Comment: (NOTE) The Xpert SA Assay (FDA approved for NASAL specimens in patients 50 years of age and older), is one component of Richardson Dubree comprehensive surveillance program. It is not intended to diagnose infection nor to guide or monitor treatment. Performed at [facility], [address], [place], [place]          Radiology Studies: DG CHEST PORT 1 VIEW  Result Date: 08/28/2019 CLINICAL DATA:  Abnormal chest x-ray EXAM:  PORTABLE CHEST 1 VIEW COMPARISON:  August 26, 2019 FINDINGS: Persistent opacity in the right base. Cassadie Pankonin new right PICC line terminates near the caval atrial junction. The side port of the NG tube is now above the GE junction with the distal tip just below the GE junction. No other interval changes. IMPRESSION: 1. Persistent opacity in the right base may represent infiltrate/pneumonia versus atelectasis. Recommend follow-up to resolution. 2. The NG tube has been pulled back with the side port well above the GE junction. Recommend advancing at least 10 cm. 3. There is Ardon Franklin new right PICC line is in good position. These results will be called to the ordering clinician or representative by the Radiologist Assistant, and communication documented in the PACS or 08/30/2019. Electronically Signed   By: August 28, 2019 III M.D   On: 08/28/2019 11:11        Scheduled Meds:  Chlorhexidine Gluconate Cloth  6 each Topical Daily   enoxaparin (LOVENOX) injection  40 mg Subcutaneous Q24H   furosemide  40 mg Intravenous BID   hydrocortisone sod succinate (SOLU-CORTEF) inj  20 mg Intravenous Daily   levothyroxine  12.5 mcg Intravenous Daily   metoprolol tartrate  2.5 mg Intravenous Q6H   pantoprazole (PROTONIX) IV  40 mg Intravenous QHS   sodium chloride flush  10-40 mL Intracatheter Q12H   Continuous Infusions:  ampicillin-sulbactam (UNASYN) IV 3 g (08/28/19 1758)   dextrose 30 mL/hr at 08/28/19 1621   methocarbamol (ROBAXIN) IV 500 mg (08/28/19 1407)     LOS: 11 days    Time spent: over 30 min    08/30/19, MD [facility]   To contact the attending provider between 7A-7P or the covering provider during after hours 7P-7A, please log into the web site [email or web address] and access using universal [place] password for that web site. If you do not have the password, please call the hospital operator.  08/28/2019, 6:08 PM

## 2019-08-29 LAB — CBC WITH DIFFERENTIAL/PLATELET
Abs Immature Granulocytes: 1.13 10*3/uL — ABNORMAL HIGH (ref 0.00–0.07)
Basophils Absolute: 0.1 10*3/uL (ref 0.0–0.1)
Basophils Relative: 1 %
Eosinophils Absolute: 0.4 10*3/uL (ref 0.0–0.5)
Eosinophils Relative: 4 %
HCT: 30.9 % — ABNORMAL LOW (ref 36.0–46.0)
Hemoglobin: 9.8 g/dL — ABNORMAL LOW (ref 12.0–15.0)
Immature Granulocytes: 11 %
Lymphocytes Relative: 7 %
Lymphs Abs: 0.7 10*3/uL (ref 0.7–4.0)
MCH: 30.9 pg (ref 26.0–34.0)
MCHC: 31.7 g/dL (ref 30.0–36.0)
MCV: 97.5 fL (ref 80.0–100.0)
Monocytes Absolute: 0.3 10*3/uL (ref 0.1–1.0)
Monocytes Relative: 3 %
Neutro Abs: 7.6 10*3/uL (ref 1.7–7.7)
Neutrophils Relative %: 74 %
Platelets: 256 10*3/uL (ref 150–400)
RBC: 3.17 MIL/uL — ABNORMAL LOW (ref 3.87–5.11)
RDW: 14.8 % (ref 11.5–15.5)
WBC: 10.3 10*3/uL (ref 4.0–10.5)
nRBC: 0.2 % (ref 0.0–0.2)

## 2019-08-29 LAB — GLUCOSE, CAPILLARY
Glucose-Capillary: 100 mg/dL — ABNORMAL HIGH (ref 70–99)
Glucose-Capillary: 103 mg/dL — ABNORMAL HIGH (ref 70–99)
Glucose-Capillary: 110 mg/dL — ABNORMAL HIGH (ref 70–99)
Glucose-Capillary: 113 mg/dL — ABNORMAL HIGH (ref 70–99)
Glucose-Capillary: 92 mg/dL (ref 70–99)
Glucose-Capillary: 98 mg/dL (ref 70–99)

## 2019-08-29 LAB — BRAIN NATRIURETIC PEPTIDE: B Natriuretic Peptide: 103.3 pg/mL — ABNORMAL HIGH (ref 0.0–100.0)

## 2019-08-29 LAB — COMPREHENSIVE METABOLIC PANEL
ALT: 23 U/L (ref 0–44)
AST: 37 U/L (ref 15–41)
Albumin: 1.6 g/dL — ABNORMAL LOW (ref 3.5–5.0)
Alkaline Phosphatase: 96 U/L (ref 38–126)
Anion gap: 11 (ref 5–15)
BUN: 21 mg/dL (ref 8–23)
CO2: 25 mmol/L (ref 22–32)
Calcium: 6 mg/dL — CL (ref 8.9–10.3)
Chloride: 99 mmol/L (ref 98–111)
Creatinine, Ser: 1.03 mg/dL — ABNORMAL HIGH (ref 0.44–1.00)
GFR calc Af Amer: 57 mL/min — ABNORMAL LOW (ref >60)
GFR calc non Af Amer: 50 mL/min — ABNORMAL LOW (ref >60)
Glucose, Bld: 105 mg/dL — ABNORMAL HIGH (ref 70–99)
Potassium: 3.2 mmol/L — ABNORMAL LOW (ref 3.5–5.1)
Sodium: 135 mmol/L (ref 135–145)
Total Bilirubin: 0.7 mg/dL (ref 0.3–1.2)
Total Protein: 4.3 g/dL — ABNORMAL LOW (ref 6.5–8.1)

## 2019-08-29 LAB — VITAMIN D 25 HYDROXY (VIT D DEFICIENCY, FRACTURES): Vit D, 25-Hydroxy: 47.9 ng/mL (ref 30–100)

## 2019-08-29 LAB — PHOSPHORUS: Phosphorus: 2 mg/dL — ABNORMAL LOW (ref 2.5–4.6)

## 2019-08-29 LAB — MAGNESIUM: Magnesium: 1.6 mg/dL — ABNORMAL LOW (ref 1.7–2.4)

## 2019-08-29 MED ORDER — ACETAMINOPHEN 325 MG PO TABS
650.0000 mg | ORAL_TABLET | Freq: Four times a day (QID) | ORAL | Status: DC | PRN
  Administered 2019-08-31: 650 mg via ORAL
  Filled 2019-08-29: qty 2

## 2019-08-29 MED ORDER — MAGNESIUM SULFATE 4 GM/100ML IV SOLN
4.0000 g | Freq: Once | INTRAVENOUS | Status: AC
  Administered 2019-08-29: 4 g via INTRAVENOUS
  Filled 2019-08-29: qty 100

## 2019-08-29 MED ORDER — CALCIUM CARBONATE ANTACID 500 MG PO CHEW
1.0000 | CHEWABLE_TABLET | Freq: Three times a day (TID) | ORAL | Status: AC
  Administered 2019-08-29 – 2019-08-30 (×3): 200 mg via ORAL
  Filled 2019-08-29 (×5): qty 1

## 2019-08-29 MED ORDER — MUPIROCIN 2 % EX OINT
1.0000 "application " | TOPICAL_OINTMENT | Freq: Two times a day (BID) | CUTANEOUS | Status: DC
  Administered 2019-08-29 – 2019-09-02 (×9): 1 via NASAL
  Filled 2019-08-29 (×4): qty 22

## 2019-08-29 MED ORDER — SIMETHICONE 80 MG PO CHEW
80.0000 mg | CHEWABLE_TABLET | Freq: Four times a day (QID) | ORAL | Status: DC | PRN
  Administered 2019-08-29 – 2019-08-31 (×4): 80 mg via ORAL
  Filled 2019-08-29 (×4): qty 1

## 2019-08-29 MED ORDER — K PHOS MONO-SOD PHOS DI & MONO 155-852-130 MG PO TABS
500.0000 mg | ORAL_TABLET | Freq: Two times a day (BID) | ORAL | Status: AC
  Administered 2019-08-29: 250 mg via ORAL
  Filled 2019-08-29 (×2): qty 2

## 2019-08-29 MED ORDER — OXYCODONE HCL 5 MG PO TABS
5.0000 mg | ORAL_TABLET | ORAL | Status: DC | PRN
  Filled 2019-08-29: qty 1

## 2019-08-29 MED ORDER — POTASSIUM CHLORIDE CRYS ER 20 MEQ PO TBCR
40.0000 meq | EXTENDED_RELEASE_TABLET | ORAL | Status: AC
  Administered 2019-08-29 (×2): 40 meq via ORAL
  Filled 2019-08-29 (×2): qty 2

## 2019-08-29 MED ORDER — POLYETHYLENE GLYCOL 3350 17 G PO PACK
17.0000 g | PACK | Freq: Every day | ORAL | Status: DC
  Administered 2019-08-29: 17 g via ORAL
  Filled 2019-08-29: qty 1

## 2019-08-29 NOTE — Progress Notes (Signed)
5 Days Post-Op  Subjective: CC: Doing well. Some mild pain on the left side of her abdomen. Felt like she had some regurg yesterday after dinner but tolerating cld. No n/v. She is passing flatus. No BM.   Objective: Vital signs in last 24 hours: Temp:  [97.4 F (36.3 C)-98.1 F (36.7 C)] 98.1 F (36.7 C) (06/21 0510) Pulse Rate:  [88-92] 88 (06/21 0510) Resp:  [18-20] 20 (06/21 0510) BP: (153-156)/(52-89) 153/52 (06/21 0510) SpO2:  [98 %-99 %] 98 % (06/21 0510) Weight:  [72.7 kg] 72.7 kg (06/21 0500) Last BM Date:  (PTA)  Intake/Output from previous day: 06/20 0701 - 06/21 0700 In: 981 [P.O.:50; IV Piggyback:931] Out: 1000 [Urine:1000] Intake/Output this shift: No intake/output data recorded.  PE: Gen: Awake and alert, nad Abd: Soft, ND, left sided tenderness without peritonitis, +BS, midline wound as below. No dehiscence or signs of infection Msk: Trace edema b/l     Lab Results:  Recent Labs    08/28/19 0353 08/29/19 0359  WBC 15.4* 10.3  HGB 11.2* 9.8*  HCT 34.9* 30.9*  PLT 288 256   BMET Recent Labs    08/28/19 0353 08/29/19 0359  NA 140 135  K 3.4* 3.2*  CL 102 99  CO2 25 25  GLUCOSE 90 105*  BUN 23 21  CREATININE 1.12* 1.03*  CALCIUM 6.6* 6.0*   PT/INR No results for input(s): LABPROT, INR in the last 72 hours. CMP     Component Value Date/Time   NA 135 08/29/2019 0359   K 3.2 (L) 08/29/2019 0359   CL 99 08/29/2019 0359   CO2 25 08/29/2019 0359   GLUCOSE 105 (H) 08/29/2019 0359   BUN 21 08/29/2019 0359   CREATININE 1.03 (H) 08/29/2019 0359   CALCIUM 6.0 (LL) 08/29/2019 0359   PROT 4.3 (L) 08/29/2019 0359   ALBUMIN 1.6 (L) 08/29/2019 0359   AST 37 08/29/2019 0359   ALT 23 08/29/2019 0359   ALKPHOS 96 08/29/2019 0359   BILITOT 0.7 08/29/2019 0359   GFRNONAA 50 (L) 08/29/2019 0359   GFRAA 57 (L) 08/29/2019 0359   Lipase     Component Value Date/Time   LIPASE 48 08/16/2019 2332       Studies/Results: DG CHEST PORT 1  VIEW  Result Date: 08/28/2019 CLINICAL DATA:  Abnormal chest x-ray EXAM: PORTABLE CHEST 1 VIEW COMPARISON:  August 26, 2019 FINDINGS: Persistent opacity in the right base. A new right PICC line terminates near the caval atrial junction. The side port of the NG tube is now above the GE junction with the distal tip just below the GE junction. No other interval changes. IMPRESSION: 1. Persistent opacity in the right base may represent infiltrate/pneumonia versus atelectasis. Recommend follow-up to resolution. 2. The NG tube has been pulled back with the side port well above the GE junction. Recommend advancing at least 10 cm. 3. There is a new right PICC line is in good position. These results will be called to the ordering clinician or representative by the Radiologist Assistant, and communication documented in the PACS or [employer]. Electronically Signed   By: Gerome Sam III M.D   On: 08/28/2019 11:11    Anti-infectives: Anti-infectives (From admission, onward)   Start     Dose/Rate Route Frequency Ordered Stop   08/24/19 0600  ceFAZolin (ANCEF) IVPB 2g/100 mL premix        2 g 200 mL/hr over 30 Minutes Intravenous On call to O.R. 08/23/19 1622 08/24/19 1202  08/21/19 1000  Ampicillin-Sulbactam (UNASYN) 3 g in sodium chloride 0.9 % 100 mL IVPB     Discontinue     3 g 200 mL/hr over 30 Minutes Intravenous Every 6 hours 08/21/19 0928         Assessment/Plan Rheumatoid arthritis - chronic steroid use CAD ? CHF vs aspiration/bilateral pleural effusions R>L  HTN HLD Hypothyroidism GERD PUD AKI - resolved, Cr 1.03 Subperiosteal abscess at tooth 20 - defer to medicine ABL Anemia - hgb 9.8, monitor   SBO S/P dx laparoscopy, exploratory laparotomy, small bowel resection 08/24/19 Dr. Ninfa Linden - POD#5 - Ileus resolving. Tolerating clear liquids and passing flatus. Can adv diet to FLD/Dysphagia if given the okay by speech - Increase to TID dressing changes to midline - Mobilize,  PT - Pulm toilet   FEN -CLD - per speech, bowel regimen, replace K and Mg VTE -SCD's, Lovenox ID -Ancef preop; Unasyn for dental abscess 6/13. No abx indicated from our standpoint Foley - External  Follow up - Dr. Ninfa Linden    LOS: 12 days    Jillyn Ledger , [facility] Surgery 08/29/2019, 9:05 AM Please see Amion for pager number during day hours 7:00am-4:30pm

## 2019-08-29 NOTE — Progress Notes (Signed)
PROGRESS NOTE    Pamela Dunn  YPP:[id number] DOB: Sep 22, 1931 DOA: 08/16/2019 PCP: Pearson Grippe, MD   No chief complaint on file.  Brief Narrative:  Pamela Dunn is Pamela Dunn 86 y.o. female with Pamela Dunn history of cardiomyopathy, rheumatoid arthritis, hypothyroidism. Patient presents secondary to abdominal pain and found to have Zauria Dombek small bowel obstruction with transition point. General surgery consulted for co-management.   Assessment & Plan:   Principal Problem:   SBO (small bowel obstruction) (HCC) Active Problems:   Hyperlipidemia   GERD   Rheumatoid arthritis (HCC)   Hypothyroidism   Chronic combined systolic and diastolic congestive heart failure (HCC)   Pressure injury of skin  Small bowel obstruction CT scan significant for SBO with transition point in the mid abdomen. General surgery consulted. Symptoms initially improved with NG tube but had emesis overnight on 6/11. Second clamping trial on 6/14 with worsening abdominal symptoms and bilious vomiting. CT 6/15 with persistent high grade partial SBO S/p diagnostic laparaoscopy, exploratory laparotomy, small bowel resection on 6/16 by general surgery D/c NG, clear liquids, will follow -> diet can be advanced to FLD/dysphagia if given ok by speech TID dressing changes to midline  Appreciate surgery recommendations  Volume Overload History of non-ischemic cardiomyopathy Chronic combined systolic and diastolic heart failure EF of 40-45% from 4/8 with associated grade I diastolic heart dysfunction. Patient is not on diuretic therapy as an outpatient.  -Lasix 40 mg BID today, follow  -Daily weights, I/O - weight up today - negative 1.1 L - persistent anasarca, some mild improvement - weights/I&O not improving, unclear how accurate these are, continue diuresis - CXR with mod pulm vascular congestion suggestion CHF as well as small bilateral effusions and bibasilar airspace disease (R>L), atelectasis vs infection - repeat CXR 6/21  Abnormal  Chest X Ray: concern for infection vs aspiration at the right base -> will plan for SLP eval as her diet is advanced.   Repeat CXR today with persistent opacity in the R base - infiltrate/pneumonia vs atelectasis -> f/u to resolution  Follow SLP eval  Poor Access: PICC placed 6/18  Hypotension Unsure of etiology. No worsening symptoms. Improved with IV fluid bolus and stress dose steroids. Resolved.  Dental abscess Possible dental abscess. Patient has been dealing with left mandibular infection in the past. She has been treated by her dentist. Currently with purulent drainage from her lower jaw/gum. CT maxillofacial significant for 5 mm abscess -discussed with Dr. Kristin Bruins, not convinced of abscess, will d/c abx and recommend outpatient follow up -Dentist recommendations: per note from Dr. Kristin Bruins, pt wishes to defer any dental treatment at this time, desires to follow up with her primary dentis  Rheumatoid arthritis On prednisone 5 mg daily as an outpatient. Started on Solu-medrol IV secondary to NPO status -Solu-Cortef IV while NPO  Hypothyroidism  -Continue Synthroid 12.5 mcg IV while NPO  Hypokalemia   Hypocalcemia   Hypomagnesemic Replace and follow   Hypophosphatemia Replace and follow  Elevated creatinine Improving, follow with diuresiss  Leukocytosis Improved today, continue to monitor  Urinary retention Patient has required multiple In/Out catheterizations. No urge to urinate. Urine culture with diphtheroids. Foley catheter placed 6/12 - Foley discontinued 6/18  Hypertension Patient is on Bidil, Toprol-XL as an outpatient. BP mostly controlled inpatient. -Continue Labetalol prn  Anemia Downtrend from admission and now stable. No evidence of bleeding -Trend  Hyperlipidemia On Zetia and Coenzyme Q10 as an outpatient.  Hypoglycemia: follow with dextrose containing fluids  Reflux: continue PPI, maalox  DVT prophylaxis: lovenox Code Status:  full Family Communication: son at bedside 6/19 Disposition:   Status is: Inpatient  Remains inpatient appropriate because:Inpatient level of care appropriate due to severity of illness   Dispo: The patient is from: Home              Anticipated d/c is to: pending              Anticipated d/c date is: > 3 days              Patient currently is not medically stable to d/c.  Consultants:   General surgery  Procedures:  S/p diagnostic laparaoscopy, exploratory laparotomy, small bowel resection on 6/16 by general surgery  Antimicrobials: Anti-infectives (From admission, onward)   Start     Dose/Rate Route Frequency Ordered Stop   08/24/19 0600  ceFAZolin (ANCEF) IVPB 2g/100 mL premix        2 g 200 mL/hr over 30 Minutes Intravenous On call to O.R. 08/23/19 1622 08/24/19 1202   08/21/19 1000  Ampicillin-Sulbactam (UNASYN) 3 g in sodium chloride 0.9 % 100 mL IVPB     Discontinue     3 g 200 mL/hr over 30 Minutes Intravenous Every 6 hours 08/21/19 0928       Subjective: C/o gas discomfort Otherwise, feeling ok  Objective: Vitals:   08/28/19 2029 08/29/19 0500 08/29/19 0510 08/29/19 1304  BP: (!) 154/89  (!) 153/52 (!) 147/61  Pulse: 88  88 87  Resp: 18  20 18   Temp: 97.6 F (36.4 C)  98.1 F (36.7 C) 97.8 F (36.6 C)  TempSrc: Oral  Oral Oral  SpO2: 98%  98% 99%  Weight:  72.7 kg    Height:        Intake/Output Summary (Last 24 hours) at 08/29/2019 1353 Last data filed at 08/29/2019 0515 Gross per 24 hour  Intake 955.96 ml  Output 1000 ml  Net -44.04 ml   Filed Weights   08/24/19 1009 08/26/19 0632 08/29/19 0500  Weight: 69.3 kg 71.6 kg 72.7 kg    Examination:  General: No acute distress. Cardiovascular: Heart sounds show Craig Wisnewski regular rate, and rhythm.  Lungs: Clear to auscultation bilaterally  Abdomen: Soft, nontender, nondistended  Neurological: Alert and oriented 3. Moves all extremities 4. Cranial nerves II through XII grossly intact. Extremities: upper  extremity > lower extremity edema     Data Reviewed: I have personally reviewed following labs and imaging studies  CBC: Recent Labs  Lab 08/26/19 0538 08/27/19 0345 08/28/19 0353 08/29/19 0359  WBC 23.9* 22.3* 15.4* 10.3  NEUTROABS 22.6*  --  12.9* 7.6  HGB 10.8* 10.3* 11.2* 9.8*  HCT 34.7* 32.4* 34.9* 30.9*  MCV 100.9* 97.0 97.8 97.5  PLT 232 253 288 256    Basic Metabolic Panel: Recent Labs  Lab 08/24/19 0531 08/24/19 0531 08/26/19 0538 08/26/19 1545 08/27/19 0345 08/28/19 0353 08/29/19 0359  NA 144   < > 144 143 138 140 135  K 3.5   < > 2.8* 3.3* 3.1* 3.4* 3.2*  CL 111   < > 110 107 102 102 99  CO2 20*   < > 23 24 26 25 25   GLUCOSE 89   < > 104* 121* 75 90 105*  BUN 17   < > 21 20 19 23 21   CREATININE 0.94   < > 1.20* 1.08* 1.15* 1.12* 1.03*  CALCIUM 6.8*   < > 6.5* 7.0* 6.5* 6.6* 6.0*  MG 2.3  --  2.0  --  1.8 1.8 1.6*  PHOS 1.9*  --  2.2*  --  3.0 2.7 2.0*   < > = values in this interval not displayed.    GFR: Estimated Creatinine Clearance: 35.6 mL/min (Lashaun Poch) (by C-G formula based on SCr of 1.03 mg/dL (H)).  Liver Function Tests: Recent Labs  Lab 08/24/19 0531 08/26/19 0538 08/27/19 0345 08/28/19 0353 08/29/19 0359  AST 27 22 29  38 37  ALT 27 16 17 23 23   ALKPHOS 45 59 79 91 96  BILITOT 1.0 0.7 1.0 0.9 0.7  PROT 4.8* 4.8* 4.6* 4.9* 4.3*  ALBUMIN 2.1* 1.8* 1.8* 1.8* 1.6*    CBG: Recent Labs  Lab 08/28/19 2027 08/29/19 0020 08/29/19 0405 08/29/19 0746 08/29/19 1119  GLUCAP 102* 100* 98 92 110*     Recent Results (from the past 240 hour(s))  Surgical pcr screen     Status: Abnormal   Collection Time: 08/24/19  7:31 AM   Specimen: Nasal Mucosa; Nasal Swab  Result Value Ref Range Status   MRSA, PCR NEGATIVE NEGATIVE Final   Staphylococcus aureus POSITIVE (Amirrah Quigley) NEGATIVE Final    Comment: (NOTE) The Xpert SA Assay (FDA approved for NASAL specimens in patients 74 years of age and older), is one component of Tenicia Gural comprehensive surveillance  program. It is not intended to diagnose infection nor to guide or monitor treatment. Performed at [facility], [address] [place], [place]          Radiology Studies: DG CHEST PORT 1 VIEW  Result Date: 08/28/2019 CLINICAL DATA:  Abnormal chest x-ray EXAM: PORTABLE CHEST 1 VIEW COMPARISON:  August 26, 2019 FINDINGS: Persistent opacity in the right base. Khalil Szczepanik new right PICC line terminates near the caval atrial junction. The side port of the NG tube is now above the GE junction with the distal tip just below the GE junction. No other interval changes. IMPRESSION: 1. Persistent opacity in the right base may represent infiltrate/pneumonia versus atelectasis. Recommend follow-up to resolution. 2. The NG tube has been pulled back with the side port well above the GE junction. Recommend advancing at least 10 cm. 3. There is Djuna Frechette new right PICC line is in good position. These results will be called to the ordering clinician or representative by the Radiologist Assistant, and communication documented in the PACS or 08/30/2019. Electronically Signed   By: August 28, 2019 III M.D   On: 08/28/2019 11:11        Scheduled Meds:  calcium carbonate  1 tablet Oral TID   Chlorhexidine Gluconate Cloth  6 each Topical Daily   enoxaparin (LOVENOX) injection  40 mg Subcutaneous Q24H   furosemide  40 mg Intravenous BID   hydrocortisone sod succinate (SOLU-CORTEF) inj  20 mg Intravenous Daily   levothyroxine  12.5 mcg Intravenous Daily   metoprolol tartrate  2.5 mg Intravenous Q6H   mupirocin ointment  1 application Nasal BID   pantoprazole (PROTONIX) IV  40 mg Intravenous QHS   polyethylene glycol  17 g Oral Daily   sodium chloride flush  10-40 mL Intracatheter Q12H   Continuous Infusions:  ampicillin-sulbactam (UNASYN) IV 3 g (08/29/19 1027)   methocarbamol (ROBAXIN) IV 500 mg (08/29/19 1330)     LOS: 12 days    Time spent: over 30 min    08/31/19, MD [facility]   To contact the attending provider between 7A-7P or the covering provider during after hours 7P-7A, please log into the web  site [email or web address] and access using universal [place] password for that web site. If you do not have the password, please call the hospital operator.  08/29/2019, 1:53 PM

## 2019-08-29 NOTE — Progress Notes (Addendum)
Assumed care of patient at 1100. Agree with previous RN assessment. Pt visiting with family and has no concerns at this time. Will continue to monitor.

## 2019-08-29 NOTE — Progress Notes (Signed)
Pt unable to tolerate PO scheduled medication. Pt vomited moderate amount of secretion after swallowing K Phos. May consider IV medication administration. Will cont to monitor.

## 2019-08-29 NOTE — Progress Notes (Signed)
  Speech Language Pathology Treatment: Dysphagia  Patient Details Name: Pamela Dunn MRN: [id number] DOB: [birth year] Today's Date: 08/29/2019 Time: 7619-5093 SLP Time Calculation (min) (ACUTE ONLY): 30 min  Assessment / Plan / Recommendation Clinical Impression  Pt benefited from minimal verbal cues to take small boluses and take small amounts at a time.   Pt does complain of gum pain - mandibular- next to four frontal teeth.  Slight erythema noted but nothing significant.  Pt consumed water, pudding and 1/2 cracker bolus.  Swallow clinically was timely with laryngeal elevation observed and no indication of aspiration.    Due to pt's dentition issues - would recommend to only advance to dys3/thin when deemed appropriate.  Will advance pt to full liquid diet per MDs note indicating approval.    No odynophagia observed today as noted 2 days prior, but pt is continuing with belching and having "gas". She denies refluxing however but SLP informed her she could have a "gas" pill four times a day per chart review.  Called RN to request medication on behalf of pt.     Educated pt to importance of hydration as she is xerostomic and recent concerns for possible oral abscess present.  She reports having issues with water "coming back up" today after taking pill - recommend pills be given with pudding -start and follow with water for maximal safety.  Advised her to consume small amounts at a time as she reports consuming minimal clears due to being worried "for it to come back up".    Will follow up x1 when diet advanced beyond full liquids to assure tolerance. Pt educated and agreeable to plan.   HPI HPI: 86 y.o. female with history of cardiomyopathy, rheumatoid arthritis, hypothyroidism presents to the ER with complaint of sudden onset of abdominal pain since yesterday evening.  Had multiple episodes of vomiting.  She is s/p surgery and hs been started on a clear liquid diet.  SLP follow up requested to  determines readiness for dietary advancement to fulls.      SLP Plan  Continue with current plan of care       Recommendations  Diet recommendations: Thin liquid;Nectar-thick liquid (Full liquids per primary team/surgery) Liquids provided via: Cup;Straw Medication Administration: Whole meds with puree Supervision: Full supervision/cueing for compensatory strategies Compensations: Slow rate;Small sips/bites Postural Changes and/or Swallow Maneuvers: Upright 30-60 min after meal;Seated upright 90 degrees                Oral Care Recommendations: Oral care QID;Oral care prior to ice chip/H20;Oral care before and after PO Follow up Recommendations: Other (comment) (TBD) SLP Visit Diagnosis: Dysphagia, unspecified (R13.10) Plan: Continue with current plan of care       GO                Pamela Dunn 08/29/2019, 9:12 PM  Pamela Infante, MS [facility] SLP Acute Rehab Services Office [phone]

## 2019-08-30 ENCOUNTER — Inpatient Hospital Stay (HOSPITAL_COMMUNITY): Payer: Medicare PPO

## 2019-08-30 LAB — CBC WITH DIFFERENTIAL/PLATELET
Abs Immature Granulocytes: 1.19 10*3/uL — ABNORMAL HIGH (ref 0.00–0.07)
Basophils Absolute: 0.1 10*3/uL (ref 0.0–0.1)
Basophils Relative: 1 %
Eosinophils Absolute: 0.2 10*3/uL (ref 0.0–0.5)
Eosinophils Relative: 2 %
HCT: 30.5 % — ABNORMAL LOW (ref 36.0–46.0)
Hemoglobin: 9.9 g/dL — ABNORMAL LOW (ref 12.0–15.0)
Immature Granulocytes: 12 %
Lymphocytes Relative: 8 %
Lymphs Abs: 0.8 10*3/uL (ref 0.7–4.0)
MCH: 31.4 pg (ref 26.0–34.0)
MCHC: 32.5 g/dL (ref 30.0–36.0)
MCV: 96.8 fL (ref 80.0–100.0)
Monocytes Absolute: 0.3 10*3/uL (ref 0.1–1.0)
Monocytes Relative: 3 %
Neutro Abs: 7.6 10*3/uL (ref 1.7–7.7)
Neutrophils Relative %: 74 %
Platelets: 289 10*3/uL (ref 150–400)
RBC: 3.15 MIL/uL — ABNORMAL LOW (ref 3.87–5.11)
RDW: 15 % (ref 11.5–15.5)
WBC: 10.1 10*3/uL (ref 4.0–10.5)
nRBC: 0.2 % (ref 0.0–0.2)

## 2019-08-30 LAB — COMPREHENSIVE METABOLIC PANEL
ALT: 20 U/L (ref 0–44)
AST: 25 U/L (ref 15–41)
Albumin: 1.6 g/dL — ABNORMAL LOW (ref 3.5–5.0)
Alkaline Phosphatase: 93 U/L (ref 38–126)
Anion gap: 10 (ref 5–15)
BUN: 19 mg/dL (ref 8–23)
CO2: 26 mmol/L (ref 22–32)
Calcium: 6.5 mg/dL — ABNORMAL LOW (ref 8.9–10.3)
Chloride: 102 mmol/L (ref 98–111)
Creatinine, Ser: 1.12 mg/dL — ABNORMAL HIGH (ref 0.44–1.00)
GFR calc Af Amer: 52 mL/min — ABNORMAL LOW (ref >60)
GFR calc non Af Amer: 45 mL/min — ABNORMAL LOW (ref >60)
Glucose, Bld: 104 mg/dL — ABNORMAL HIGH (ref 70–99)
Potassium: 3.9 mmol/L (ref 3.5–5.1)
Sodium: 138 mmol/L (ref 135–145)
Total Bilirubin: 0.9 mg/dL (ref 0.3–1.2)
Total Protein: 4.4 g/dL — ABNORMAL LOW (ref 6.5–8.1)

## 2019-08-30 LAB — GLUCOSE, CAPILLARY
Glucose-Capillary: 86 mg/dL (ref 70–99)
Glucose-Capillary: 89 mg/dL (ref 70–99)
Glucose-Capillary: 91 mg/dL (ref 70–99)
Glucose-Capillary: 109 mg/dL — ABNORMAL HIGH (ref 70–99)
Glucose-Capillary: 137 mg/dL — ABNORMAL HIGH (ref 70–99)
Glucose-Capillary: 109 mg/dL — ABNORMAL HIGH (ref 70–99)

## 2019-08-30 LAB — MAGNESIUM: Magnesium: 2.7 mg/dL — ABNORMAL HIGH (ref 1.7–2.4)

## 2019-08-30 LAB — PHOSPHORUS: Phosphorus: 2.4 mg/dL — ABNORMAL LOW (ref 2.5–4.6)

## 2019-08-30 MED ORDER — DOCUSATE SODIUM 100 MG PO CAPS
100.0000 mg | ORAL_CAPSULE | Freq: Two times a day (BID) | ORAL | Status: DC
  Administered 2019-08-30 – 2019-08-31 (×3): 100 mg via ORAL
  Filled 2019-08-30 (×3): qty 1

## 2019-08-30 MED ORDER — POLYETHYLENE GLYCOL 3350 17 G PO PACK
17.0000 g | PACK | Freq: Two times a day (BID) | ORAL | Status: DC
  Administered 2019-08-30 – 2019-09-02 (×4): 17 g via ORAL
  Filled 2019-08-30 (×6): qty 1

## 2019-08-30 MED ORDER — ENSURE SURGERY PO LIQD
237.0000 mL | ORAL | Status: DC
  Administered 2019-08-31 – 2019-09-02 (×3): 237 mL via ORAL
  Filled 2019-08-30 (×3): qty 237

## 2019-08-30 MED ORDER — ADULT MULTIVITAMIN W/MINERALS CH
1.0000 | ORAL_TABLET | Freq: Every day | ORAL | Status: DC
  Administered 2019-08-30 – 2019-09-02 (×4): 1 via ORAL
  Filled 2019-08-30 (×4): qty 1

## 2019-08-30 MED ORDER — PREDNISONE 5 MG PO TABS
5.0000 mg | ORAL_TABLET | Freq: Every day | ORAL | Status: DC
  Administered 2019-08-30 – 2019-09-02 (×4): 5 mg via ORAL
  Filled 2019-08-30 (×4): qty 1

## 2019-08-30 MED ORDER — PRO-STAT SUGAR FREE PO LIQD
30.0000 mL | Freq: Every day | ORAL | Status: DC
  Administered 2019-08-30 – 2019-09-01 (×3): 30 mL via ORAL
  Filled 2019-08-30 (×3): qty 30

## 2019-08-30 MED ORDER — LEVOTHYROXINE SODIUM 25 MCG PO TABS
25.0000 ug | ORAL_TABLET | Freq: Every day | ORAL | Status: DC
  Administered 2019-08-30 – 2019-09-02 (×4): 25 ug via ORAL
  Filled 2019-08-30 (×4): qty 1

## 2019-08-30 NOTE — Progress Notes (Signed)
PROGRESS NOTE    Pamela Dunn  DGU:[id number] DOB: 24-Jul-1931 DOA: 08/16/2019 PCP: Pearson Grippe, MD   No chief complaint on file.  Brief Narrative:  Pamela Dunn is Pamela Dunn 86 y.o. female with Pamela Dunn history of cardiomyopathy, rheumatoid arthritis, hypothyroidism. Patient presents secondary to abdominal pain and found to have Pamela Dunn small bowel obstruction with transition point. General surgery consulted for co-management.   She was admitted for Pamela Dunn SBO and is now s/p small bowel resection on 6/16 by general surgery.  Hospitalization c/b volume overload and is being diuresed.    Assessment & Plan:   Principal Problem:   SBO (small bowel obstruction) (HCC) Active Problems:   Hyperlipidemia   GERD   Rheumatoid arthritis (HCC)   Hypothyroidism   Chronic combined systolic and diastolic congestive heart failure (HCC)   Pressure injury of skin  Small bowel obstruction CT scan significant for SBO with transition point in the mid abdomen. General surgery consulted. Symptoms initially improved with NG tube but had emesis overnight on 6/11. Second clamping trial on 6/14 with worsening abdominal symptoms and bilious vomiting. CT 6/15 with persistent high grade partial SBO S/p diagnostic laparaoscopy, exploratory laparotomy, small bowel resection on 6/16 by general surgery Tolerating FLD -> diet can be advanced per speech TID dressing changes to midline  Appreciate surgery recommendations  Volume Overload History of non-ischemic cardiomyopathy Chronic combined systolic and diastolic heart failure EF of 40-45% from 4/8 with associated grade I diastolic heart dysfunction. Patient is not on diuretic therapy as an outpatient.  -Lasix 40 mg BID today, follow  -Daily weights, I/O - weight up today - negative 734.2 ml - persistent anasarca, some mild improvement - weights/I&O not improving, unclear how accurate these are, continue diuresis -> BNP improving, still has some edema, but seems to be improving.  - CXR  with mod pulm vascular congestion suggestion CHF as well as small bilateral effusions and bibasilar airspace disease (R>L), atelectasis vs infection - CXR 6/22 with right basilar opacity pneumonia vs atelectasis  Abnormal Chest X Ray: concern for infection vs aspiration at the right base -> will plan for SLP eval as her diet is advanced.   CXR 6/22 with right basilar opacity, pneumonia or atelectasis  f/u to resolution  Follow SLP eval -> appreciate speech recs  Poor Access: PICC placed 6/18  Hypotension Unsure of etiology. No worsening symptoms. Improved with IV fluid bolus and stress dose steroids. Resolved.  Dental abscess Possible dental abscess. Patient has been dealing with left mandibular infection in the past. She has been treated by her dentist. Currently with purulent drainage from her lower jaw/gum. CT maxillofacial significant for 5 mm abscess -discussed with Dr. Kristin Bruins, not convinced of abscess, will d/c abx (unasyn) and recommend outpatient follow up -Dentist recommendations: per note from Dr. Kristin Bruins, pt wishes to defer any dental treatment at this time, desires to follow up with her primary dentis  Rheumatoid arthritis On prednisone 5 mg daily as an outpatient.  -Resume oral steroids  Hypothyroidism  -resume oral synthroid  Hypokalemia  Hypocalcemia  Hypomagnesemic Replace and follow  Improved  Hypophosphatemia Replace and follow  Elevated creatinine follow with diuresiss  Leukocytosis Improved today, continue to monitor  Urinary retention Patient has required multiple In/Out catheterizations. No urge to urinate. Urine culture with diphtheroids. Foley catheter placed 6/12 - Foley discontinued 6/18  Hypertension Patient is on Bidil, Toprol-XL as an outpatient. BP mostly controlled inpatient. -Continue Labetalol prn  Anemia Downtrend from admission and now stable. No  evidence of bleeding -Trend  Hyperlipidemia On Zetia and Coenzyme Q10  as an outpatient.  Hypoglycemia: follow with dextrose containing fluids  Reflux: continue PPI, maalox  DVT prophylaxis: lovenox Code Status: full Family Communication: son at bedside 6/19 Disposition:   Status is: Inpatient  Remains inpatient appropriate because:Inpatient level of care appropriate due to severity of illness   Dispo: The patient is from: Home              Anticipated d/c is to: pending              Anticipated d/c date is: > 3 days              Patient currently is not medically stable to d/c.  Consultants:   General surgery  Procedures:  S/p diagnostic laparaoscopy, exploratory laparotomy, small bowel resection on 6/16 by general surgery  Antimicrobials: Anti-infectives (From admission, onward)   Start     Dose/Rate Route Frequency Ordered Stop   08/24/19 0600  ceFAZolin (ANCEF) IVPB 2g/100 mL premix        2 g 200 mL/hr over 30 Minutes Intravenous On call to O.R. 08/23/19 1622 08/24/19 1202   08/21/19 1000  Ampicillin-Sulbactam (UNASYN) 3 g in sodium chloride 0.9 % 100 mL IVPB  Status:  Discontinued        3 g 200 mL/hr over 30 Minutes Intravenous Every 6 hours 08/21/19 0928 08/29/19 1413     Subjective: No new complaints today Feeling ok overall  Objective: Vitals:   08/29/19 2114 08/30/19 0435 08/30/19 0520 08/30/19 1511  BP: 138/78 (!) 144/55  (!) 133/56  Pulse: 89 84  85  Resp: 20 20  19   Temp: 98 F (36.7 C) 97.7 F (36.5 C)  97.8 F (36.6 C)  TempSrc: Oral Oral  Oral  SpO2: 96% 98%  97%  Weight:   73.4 kg   Height:        Intake/Output Summary (Last 24 hours) at 08/30/2019 1639 Last data filed at 08/30/2019 1603 Gross per 24 hour  Intake 390.39 ml  Output 200 ml  Net 190.39 ml   Filed Weights   08/26/19 0632 08/29/19 0500 08/30/19 0520  Weight: 71.6 kg 72.7 kg 73.4 kg    Examination:  General: No acute distress. Cardiovascular: Heart sounds show Jadd Gasior regular rate, and rhythm Lungs: Clear to auscultation bilaterally    Abdomen: Soft, dressing intact, no TTP Neurological: Alert and oriented 3. Moves all extremities 4 . Cranial nerves II through XII grossly intact. Skin: Warm and dry. No rashes or lesions. Extremities: upper>LE edema      Data Reviewed: I have personally reviewed following labs and imaging studies  CBC: Recent Labs  Lab 08/26/19 0538 08/27/19 0345 08/28/19 0353 08/29/19 0359 08/30/19 0432  WBC 23.9* 22.3* 15.4* 10.3 10.1  NEUTROABS 22.6*  --  12.9* 7.6 7.6  HGB 10.8* 10.3* 11.2* 9.8* 9.9*  HCT 34.7* 32.4* 34.9* 30.9* 30.5*  MCV 100.9* 97.0 97.8 97.5 96.8  PLT 232 253 288 256 289    Basic Metabolic Panel: Recent Labs  Lab 08/26/19 0538 08/26/19 0538 08/26/19 1545 08/27/19 0345 08/28/19 0353 08/29/19 0359 08/30/19 0432  NA 144   < > 143 138 140 135 138  K 2.8*   < > 3.3* 3.1* 3.4* 3.2* 3.9  CL 110   < > 107 102 102 99 102  CO2 23   < > 24 26 25 25 26   GLUCOSE 104*   < > 121* 75 90  105* 104*  BUN 21   < > 20 19 23 21 19   CREATININE 1.20*   < > 1.08* 1.15* 1.12* 1.03* 1.12*  CALCIUM 6.5*   < > 7.0* 6.5* 6.6* 6.0* 6.5*  MG 2.0  --   --  1.8 1.8 1.6* 2.7*  PHOS 2.2*  --   --  3.0 2.7 2.0* 2.4*   < > = values in this interval not displayed.    GFR: Estimated Creatinine Clearance: 32.9 mL/min (Zaia Carre) (by C-G formula based on SCr of 1.12 mg/dL (H)).  Liver Function Tests: Recent Labs  Lab 08/26/19 0538 08/27/19 0345 08/28/19 0353 08/29/19 0359 08/30/19 0432  AST 22 29 38 37 25  ALT 16 17 23 23 20   ALKPHOS 59 79 91 96 93  BILITOT 0.7 1.0 0.9 0.7 0.9  PROT 4.8* 4.6* 4.9* 4.3* 4.4*  ALBUMIN 1.8* 1.8* 1.8* 1.6* 1.6*    CBG: Recent Labs  Lab 08/29/19 2020 08/30/19 0122 08/30/19 0518 08/30/19 0742 08/30/19 1236  GLUCAP 113* 86 89 91 109*     Recent Results (from the past 240 hour(s))  Surgical pcr screen     Status: Abnormal   Collection Time: 08/24/19  7:31 AM   Specimen: Nasal Mucosa; Nasal Swab  Result Value Ref Range Status   MRSA, PCR  NEGATIVE NEGATIVE Final   Staphylococcus aureus POSITIVE (Jasai Sorg) NEGATIVE Final    Comment: (NOTE) The Xpert SA Assay (FDA approved for NASAL specimens in patients 5 years of age and older), is one component of Raymir Frommelt comprehensive surveillance program. It is not intended to diagnose infection nor to guide or monitor treatment. Performed at [facility], [place] [address], [place], [place] [address]          Radiology Studies: DG CHEST PORT 1 VIEW  Result Date: 08/30/2019 CLINICAL DATA:  Hypoxia. EXAM: PORTABLE CHEST 1 VIEW COMPARISON:  08/28/2019 FINDINGS: Tonimarie Gritz right PICC terminates near the superior cavoatrial junction. Enteric tube has been removed. The cardiomediastinal silhouette is unchanged. Lung volumes remain low with elevation of the right hemidiaphragm. Asymmetric right basilar airspace opacity is unchanged. There is minimal scarring or atelectasis in the left mid lung. No large pleural effusion or pneumothorax is identified. IMPRESSION: Unchanged right basilar opacity which may reflect pneumonia or atelectasis. Electronically Signed   By: Logan Bores M.D.   On: 08/30/2019 09:19        Scheduled Meds: . calcium carbonate  1 tablet Oral TID  . Chlorhexidine Gluconate Cloth  6 each Topical Daily  . docusate sodium  100 mg Oral BID  . enoxaparin (LOVENOX) injection  40 mg Subcutaneous Q24H  . [START ON 08/31/2019] feeding supplement  237 mL Oral Q24H  . feeding supplement (PRO-STAT SUGAR FREE 64)  30 mL Oral Daily  . furosemide  40 mg Intravenous BID  . hydrocortisone sod succinate (SOLU-CORTEF) inj  20 mg Intravenous Daily  . levothyroxine  12.5 mcg Intravenous Daily  . metoprolol tartrate  2.5 mg Intravenous Q6H  . multivitamin with minerals  1 tablet Oral Daily  . mupirocin ointment  1 application Nasal BID  . pantoprazole (PROTONIX) IV  40 mg Intravenous QHS  . polyethylene glycol  17 g Oral BID  . sodium chloride flush  10-40 mL Intracatheter Q12H    Continuous Infusions: . methocarbamol (ROBAXIN) IV Stopped (08/30/19 1342)     LOS: 13 days    Time spent: over 30 min    Fayrene Helper, MD [facility]  To contact the attending provider between 7A-7P or the covering provider during after hours 7P-7A, please log into the web site [email or web address] and access using universal [place] password for that web site. If you do not have the password, please call the hospital operator.  08/30/2019, 4:39 PM

## 2019-08-30 NOTE — TOC Initial Note (Signed)
Transition of Care [facility]) - Initial/Assessment Note    Patient Details  Name: Pamela Dunn MRN: [id number] Date of Birth: 1932/03/10  Transition of Care [facility]) CM/SW Contact:    Dessa Phi, RN Phone Number: 08/30/2019, 10:38 AM  Clinical Narrative: Ptient agree to fax out to SNF-await bed offers.                  Expected Discharge Plan: Skilled Nursing Facility Barriers to Discharge: Continued Medical Work up   Patient Goals and CMS Choice Patient states their goals for this hospitalization and ongoing recovery are:: go to rehab CMS Medicare.gov Compare Post Acute Care list provided to:: Patient Choice offered to / list presented to : Patient  Expected Discharge Plan and Services Expected Discharge Plan: [place]   Discharge Planning Services: CM Consult   Living arrangements for the past 2 months: Single Family Home                                      Prior Living Arrangements/Services Living arrangements for the past 2 months: Single Family Home Lives with:: Self Patient language and need for interpreter reviewed:: Yes Do you feel safe going back to the place where you live?: Yes      Need for Family Participation in Patient Care: No (Comment) Care giver support system in place?: Yes (comment)   Criminal Activity/Legal Involvement Pertinent to Current Situation/Hospitalization: No - Comment as needed  Activities of Daily Living Home Assistive Devices/Equipment: None ADL Screening (condition at time of admission) Patient's cognitive ability adequate to safely complete daily activities?: Yes Is the patient deaf or have difficulty hearing?: No Does the patient have difficulty seeing, even when wearing glasses/contacts?: No Does the patient have difficulty concentrating, remembering, or making decisions?: No Patient able to express need for assistance with ADLs?: No Does the patient have difficulty dressing or bathing?: No Independently  performs ADLs?: Yes (appropriate for developmental age) Does the patient have difficulty walking or climbing stairs?: No Weakness of Legs: None Weakness of Arms/Hands: None  Permission Sought/Granted Permission sought to share information with : Case Manager Permission granted to share information with : Yes, Verbal Permission Granted  Share Information with NAME: Case manager     Permission granted to share info w Relationship: Yvone Neu son [phone]     Emotional Assessment Appearance:: Appears stated age Attitude/Demeanor/Rapport: Gracious Affect (typically observed): Accepting Orientation: : Oriented to Self, Oriented to Place, Oriented to  Time, Oriented to Situation Alcohol / Substance Use: Not Applicable Psych Involvement: No (comment)  Admission diagnosis:  Small bowel obstruction (HCC) [K56.609] SBO (small bowel obstruction) ([place]) [K56.609] Renal insufficiency [N28.9] Encounter for imaging study to confirm nasogastric (NG) tube placement [Z01.89] Patient Active Problem List   Diagnosis Date Noted  . Pressure injury of skin 08/27/2019  . Hypothyroidism 08/23/2019  . Chronic combined systolic and diastolic congestive heart failure ([place]) 08/23/2019  . SBO (small bowel obstruction) ([place]) 08/17/2019  . Multiple pulmonary nodules 06/19/2017  . Metatarsalgia of left foot 07/13/2014  . Hx of adenomatous colonic polyps 09/05/2013  . Spinal stenosis of lumbar region 09/05/2013  . Lumbar disc disease 09/05/2013  . Hyperlipidemia 10/23/2009  . GERD 10/23/2009  . Rheumatoid arthritis ([place]) 10/23/2009  . ABDOMINAL PAIN, UNSPECIFIED SITE 10/23/2009  . ANEMIA, HX OF 10/23/2009  . PEPTIC ULCER DISEASE, HX OF 10/23/2009   PCP:  Jani Gravel, MD  Pharmacy:   [id number] [place], [place] - [phone] AT [facility] OF [address] [place] [address] Phone: [phone] Fax: [phone]     Social Determinants of Health (SDOH)  Interventions    Readmission Risk Interventions No flowsheet data found.

## 2019-08-30 NOTE — Progress Notes (Signed)
Nutrition Follow-up  RD working remotely.  DOCUMENTATION CODES:   Not applicable  INTERVENTION:  - will order Ensure Enlive once/day, each supplement provides 350 kcal and 20 grams of protein. - will order Magic Cup with dinner meals, each supplement provides 290 kcal and 9 grams of protein. - will order 30 mL Prostat once/day, each supplement provides 100 kcal and 15 grams of protein. - will order 1 tablet multivitamin with minerals/day.   NUTRITION DIAGNOSIS:   Inadequate oral intake related to acute illness as evidenced by per patient/family report, other (comment) (FLD).  GOAL:   Patient will meet greater than or equal to 90% of their needs  MONITOR:   PO intake, Supplement acceptance, Diet advancement, Labs, Weight trends  ASSESSMENT:   86 y.o. female with a history of cardiomyopathy, rheumatoid arthritis, and hypothyroidism. She presented to the ED with abdominal pain and found to have a SBO with transition point. General surgery consulted for co-management.  Diet advanced from NPO to CLD on 6/20 at 0830 and to FLD on 6/21 at 2025. Per flow sheet, she consumed 10% of breakfast this AM. No other intakes documented since diet advancement began.   NGT, which was placed on 6/9, was removed on 6/20 at 0930. Weight has been fluctuating throughout hospitalization.  Per notes: - ileus is resolving - POD #6 laparoscopy, ex lap, small bowel resection   Labs reviewed; CBGs: 86, 89, 91, 109 mg/dl, creatinine: 3.89 mg/dl, Ca: 6.5 mg/dl, GFR: 45 ml/min. Medications reviewed; 100 mg colace BID, 40 mg IV lasix BID, 20 mg solu-cortef/day, 12.5 mcg IV synthroid/day, 1 packet miralax BID.    NUTRITION - FOCUSED PHYSICAL EXAM:  unable to complete at this time.   Diet Order:   Diet Order            Diet full liquid Room service appropriate? Yes; Fluid consistency: Thin  Diet effective now                 EDUCATION NEEDS:   No education needs have been identified at this  time  Skin:  Skin Assessment: Skin Integrity Issues: Skin Integrity Issues:: Incisions, Stage II Stage II: L mid-coccyx (newly documented 6/18) Incisions: abdomen (6/16)  Last BM:  6/7  Height:   Ht Readings from Last 1 Encounters:  08/24/19 5' (1.524 m)    Weight:   Wt Readings from Last 1 Encounters:  08/30/19 73.4 kg     Estimated Nutritional Needs:  Kcal:  1940-2150 kcal Protein:  90-100 grams Fluid:  >/= 1.9 L/day     Trenton Gammon, MS, RD, LDN, CNSC Inpatient Clinical Dietitian RD pager # available in AMION  After hours/weekend pager # available in [facility]

## 2019-08-30 NOTE — NC FL2 (Signed)
[place] LEVEL OF CARE SCREENING TOOL     IDENTIFICATION  Patient Name: Pamela Dunn Birthdate: 29-Jun-1931 Sex: female Admission Date (Current Location): 08/16/2019  [facility] and [place] Number:  Herbalist and Address:  [facility],  [place], [place]      Provider Number: [id number]  Attending Physician Name and Address:  Elodia Florence., *  Relative Name and Phone Number:  Ryliee Figge son [phone]    Current Level of Care: Hospital Recommended Level of Care: [place] Prior Approval Number:    Date Approved/Denied:   PASRR Number: [id number]  Discharge Plan: SNF    Current Diagnoses: Patient Active Problem List   Diagnosis Date Noted  . Pressure injury of skin 08/27/2019  . Hypothyroidism 08/23/2019  . Chronic combined systolic and diastolic congestive heart failure ([place]) 08/23/2019  . SBO (small bowel obstruction) ([place]) 08/17/2019  . Multiple pulmonary nodules 06/19/2017  . Metatarsalgia of left foot 07/13/2014  . Hx of adenomatous colonic polyps 09/05/2013  . Spinal stenosis of lumbar region 09/05/2013  . Lumbar disc disease 09/05/2013  . Hyperlipidemia 10/23/2009  . GERD 10/23/2009  . Rheumatoid arthritis ([place]) 10/23/2009  . ABDOMINAL PAIN, UNSPECIFIED SITE 10/23/2009  . ANEMIA, HX OF 10/23/2009  . PEPTIC ULCER DISEASE, HX OF 10/23/2009    Orientation RESPIRATION BLADDER Height & Weight     Self, Time, Situation, Place  Normal Continent Weight: 73.4 kg Height:  5' (152.4 cm)  BEHAVIORAL SYMPTOMS/MOOD NEUROLOGICAL BOWEL NUTRITION STATUS      Continent Diet (Regular)  AMBULATORY STATUS COMMUNICATION OF NEEDS Skin   Limited Assist Verbally Surgical wounds (midline abd incision-n/s w-d dsg,abd pad tid,prn soiling.)                       Personal Care Assistance Level of Assistance  Bathing, Feeding, Dressing   Feeding assistance: Limited assistance Dressing  Assistance: Limited assistance     Functional Limitations Info  Sight, Speech, Hearing Sight Info: Impaired (eyeglasses) Hearing Info: Adequate Speech Info: Adequate    SPECIAL CARE FACTORS FREQUENCY  PT (By licensed PT), OT (By licensed OT)     PT Frequency: 5x week OT Frequency: 5x week            Contractures Contractures Info: Not present    Additional Factors Info  Code Status, Allergies Code Status Info: Full Allergies Info: Coreg/carvedilol           Current Medications (08/30/2019):  This is the current hospital active medication list Current Facility-Administered Medications  Medication Dose Route Frequency Provider Last Rate Last Admin  . acetaminophen (TYLENOL) tablet 650 mg  650 mg Oral Q6H PRN Maczis, Barth Kirks, PA-C      . alum & mag hydroxide-simeth (MAALOX/MYLANTA) 200-200-20 MG/5ML suspension 15 mL  15 mL Oral Q6H PRN Elodia Florence., MD   15 mL at 08/28/19 0432  . calcium carbonate (TUMS - dosed in mg elemental calcium) chewable tablet 200 mg of elemental calcium  1 tablet Oral TID Elodia Florence., MD   200 mg of elemental calcium at 08/30/19 1013  . Chlorhexidine Gluconate Cloth 2 % PADS 6 each  6 each Topical Daily Coralie Keens, MD   6 each at 08/29/19 1012  . docusate sodium (COLACE) capsule 100 mg  100 mg Oral BID Jillyn Ledger, PA-C   100 mg at 08/30/19 1013  . enoxaparin (LOVENOX) injection  40 mg  40 mg Subcutaneous Q24H Abigail Miyamoto, MD   40 mg at 08/29/19 1255  . fentaNYL (SUBLIMAZE) injection 25 mcg  25 mcg Intravenous Q4H PRN Abigail Miyamoto, MD   25 mcg at 08/22/19 1115  . furosemide (LASIX) injection 40 mg  40 mg Intravenous BID Zigmund Daniel., MD   40 mg at 08/30/19 1014  . hydrocortisone sodium succinate (SOLU-CORTEF) 100 MG injection 20 mg  20 mg Intravenous Daily Abigail Miyamoto, MD   20 mg at 08/30/19 1013  . HYDROmorphone (DILAUDID) injection 0.5 mg  0.5 mg Intravenous Q2H PRN Abigail Miyamoto, MD    0.5 mg at 08/25/19 0342  . labetalol (NORMODYNE) injection 10 mg  10 mg Intravenous Q2H PRN Abigail Miyamoto, MD   10 mg at 08/21/19 0629  . levothyroxine (SYNTHROID, LEVOTHROID) injection 12.5 mcg  12.5 mcg Intravenous Daily Abigail Miyamoto, MD   12.5 mcg at 08/30/19 1014  . lip balm (CARMEX) ointment   Topical PRN Abigail Miyamoto, MD   Given at 08/19/19 [phone]  . methocarbamol (ROBAXIN) 500 mg in dextrose 5 % 50 mL IVPB  500 mg Intravenous Q8H Juliet Rude, PA-C 100 mL/hr at 08/30/19 0624 500 mg at 08/30/19 0624  . metoprolol tartrate (LOPRESSOR) injection 2.5 mg  2.5 mg Intravenous Q6H Abigail Miyamoto, MD   2.5 mg at 08/30/19 3818  . mupirocin ointment (BACTROBAN) 2 % 1 application  1 application Nasal BID Zigmund Daniel., MD   1 application at 08/30/19 1015  . Muscle Rub CREA 1 application  1 application Topical PRN Abigail Miyamoto, MD   1 application at 08/29/19 0455  . ondansetron (ZOFRAN) tablet 4 mg  4 mg Oral Q6H PRN Abigail Miyamoto, MD       Or  . ondansetron [facility]) injection 4 mg  4 mg Intravenous Q6H PRN Abigail Miyamoto, MD   4 mg at 08/27/19 0334  . oxyCODONE (Oxy IR/ROXICODONE) immediate release tablet 5 mg  5 mg Oral Q4H PRN Maczis, Elmer Sow, PA-C      . pantoprazole (PROTONIX) injection 40 mg  40 mg Intravenous QHS Abigail Miyamoto, MD   40 mg at 08/29/19 2209  . polyethylene glycol (MIRALAX / GLYCOLAX) packet 17 g  17 g Oral BID Jacinto Halim, PA-C   17 g at 08/30/19 1014  . simethicone (MYLICON) chewable tablet 80 mg  80 mg Oral QID PRN Zigmund Daniel., MD   80 mg at 08/29/19 2002  . sodium chloride flush (NS) 0.9 % injection 10-40 mL  10-40 mL Intracatheter Q12H Zigmund Daniel., MD   10 mL at 08/26/19 1028  . sodium chloride flush (NS) 0.9 % injection 10-40 mL  10-40 mL Intracatheter PRN Zigmund Daniel., MD         Discharge Medications: Please see discharge summary for a list of discharge medications.  Relevant Imaging  Results:  Relevant Lab Results:   Additional Information ss#[phone]  Lety Cullens, Olegario Messier, RN

## 2019-08-30 NOTE — Progress Notes (Signed)
Occupational Therapy Treatment Patient Details Name: Pamela Dunn MRN: [id number] DOB: 19-Apr-1931 Today's Date: 08/30/2019    History of present illness 86 y.o. female with a history of cardiomyopathy, rheumatoid arthritis, hypothyroidism. Patient presents secondary to abdominal pain and found to have a small bowel obstruction with transition point. General surgery consulted for co-management; s/p ex lap with small bowel resection 08/24/19.   OT comments  Upon arrival note patient incontinent of stool "I didn't even know I went." Patient require mod/max A for rolling with cues for sequencing patient stating not to pull on her as skin is sensitive, try to reassure patient being very gentle. Patient require mod/max A to sit up at edge of bed with cues to keep weight forward. Patient reporting nausea with mobility. With min/mod A x2 and mod cues for body mechanics pt stood edge of bed and required min/mod A to side step to HOB approx 5 steps. Patient reports fatigue "I need to sit down." Will continue to follow.     Follow Up Recommendations  SNF;Supervision/Assistance - 24 hour    Equipment Recommendations  None recommended by OT (pt home handicap accessible)       Precautions / Restrictions Precautions Precautions: Fall       Mobility Bed Mobility Overal bed mobility: Needs Assistance Bed Mobility: Rolling;Sidelying to Sit;Sit to Supine Rolling: Mod assist;Max assist Sidelying to sit: Mod assist;Max assist   Sit to supine: Mod assist   General bed mobility comments: mod cues for sequencing, patient has difficulty with reaching for bed rails to assist states "don't pull" increased assist with sitting up to EOB due to abdominal pain. Assist to lift LEs back onto bed   Transfers Overall transfer level: Needs assistance Equipment used: 4-wheeled walker Transfers: Sit to/from Stand Sit to Stand: Min assist;Mod assist;+2 physical assistance;+2 safety/equipment         General  transfer comment: assist to power up to standing, mod cues for body mechanics not to pull onto walker    Balance Overall balance assessment: Needs assistance Sitting-balance support: Feet supported;Bilateral upper extremity supported Sitting balance-Leahy Scale: Fair Sitting balance - Comments: cues to keep weight forward, patient has tendency to lean backwards   Standing balance support: During functional activity;Bilateral upper extremity supported Standing balance-Leahy Scale: Poor Standing balance comment: reliant on RW and external support                           ADL either performed or assessed with clinical judgement   ADL Overall ADL's : Needs assistance/impaired                     Lower Body Dressing: Total assistance;Sit to/from stand Lower Body Dressing Details (indicate cue type and reason): to don shoes Toilet Transfer: Minimal assistance;Moderate assistance;+2 for physical assistance;+2 for safety/equipment;RW;Ambulation Toilet Transfer Details (indicate cue type and reason): simulated with side steps towards head of bed, used patient's rollator however required assist to advance to the L with side stepping. min/mod A x2 for sit to stand Toileting- Clothing Manipulation and Hygiene: Total assistance;Bed level Toileting - Clothing Manipulation Details (indicate cue type and reason): pt incontinent of stool upon arrival     Functional mobility during ADLs: Minimal assistance;Moderate assistance;Cueing for sequencing;Cueing for safety;Rolling walker;+2 for physical assistance;+2 for safety/equipment                 Cognition Arousal/Alertness: Awake/alert Behavior During Therapy: [facility] for tasks assessed/performed Overall  Cognitive Status: Within Functional Limits for tasks assessed                                 General Comments: patient tangential                   Pertinent Vitals/ Pain       Pain Assessment:  Faces Faces Pain Scale: Hurts little more Pain Location: abdomen/incision with mobility Pain Descriptors / Indicators: Grimacing Pain Intervention(s): Monitored during session         Frequency  Min 2X/week        Progress Toward Goals  OT Goals(current goals can now be found in the care plan section)  Progress towards OT goals: Progressing toward goals  Acute Rehab OT Goals Patient Stated Goal: go home with sister  OT Goal Formulation: With patient Time For Goal Achievement: 09/09/19 Potential to Achieve Goals: Good ADL Goals Pt Will Perform Upper Body Dressing: with set-up;sitting Pt Will Perform Lower Body Dressing: with min guard assist;sit to/from stand;sitting/lateral leans Pt Will Transfer to Toilet: with min guard assist;ambulating;bedside commode Pt Will Perform Toileting - Clothing Manipulation and hygiene: with min guard assist;sit to/from stand;sitting/lateral leans Additional ADL Goal #1: patient will require min G assist for bed mobility in preparation for self care tasks.  Plan Discharge plan remains appropriate       AM-PAC OT "6 Clicks" Daily Activity     Outcome Measure   Help from another person eating meals?: A Little Help from another person taking care of personal grooming?: A Little Help from another person toileting, which includes using toliet, bedpan, or urinal?: Total Help from another person bathing (including washing, rinsing, drying)?: A Lot Help from another person to put on and taking off regular upper body clothing?: A Little Help from another person to put on and taking off regular lower body clothing?: Total 6 Click Score: 13    End of Session Equipment Utilized During Treatment:  (pt's 0DX)  OT Visit Diagnosis: Unsteadiness on feet (R26.81);Other abnormalities of gait and mobility (R26.89);Muscle weakness (generalized) (M62.81);Pain Pain - part of body:  (abdomen)   Activity Tolerance Patient limited by fatigue   Patient Left  in bed;with call bell/phone within reach;with bed alarm set;with nursing/sitter in room   Nurse Communication Mobility status        Time: 1127-1200 OT Time Calculation (min): 33 min  Charges: OT General Charges $OT Visit: 1 Visit OT Treatments $Self Care/Home Management : 23-37 mins  Marlyce Huge OT Pager: [phone]   Carmelia Roller 08/30/2019, 2:40 PM

## 2019-08-30 NOTE — Progress Notes (Signed)
6 Days Post-Op  Subjective: CC: Patient adv to fld per speech yesterday. Notes that she tolerates ice cream and soup without increased abdominal pain, n/v. Did have some difficulty with potassium PO as noted in RN note. No n/v since that time and has been able to tolerates liquids afterwards without difficulty. Pain on the left side of her abdomen has improved and less frequent. Passing flatus. No BM.   Objective: Vital signs in last 24 hours: Temp:  [97.7 F (36.5 C)-98 F (36.7 C)] 97.7 F (36.5 C) (06/22 0435) Pulse Rate:  [84-89] 84 (06/22 0435) Resp:  [18-20] 20 (06/22 0435) BP: (138-147)/(55-78) 144/55 (06/22 0435) SpO2:  [96 %-99 %] 98 % (06/22 0435) Weight:  [73.4 kg] 73.4 kg (06/22 0520) Last BM Date:  (PTA)  Intake/Output from previous day: 06/21 0701 - 06/22 0700 In: 830 [P.O.:480; IV Piggyback:350] Out: 600 [Urine:600] Intake/Output this shift: No intake/output data recorded.  PE: Gen: Awake and alert, nad Abd: Soft, ND, left sided tenderness with some mild epigastric tenderness without peritonitis that is improved from yesterday, +BS, midline wound with less sloughing and increased granulation tissue compared to yesterday. No dehiscence or signs of infection Msk: Trace edema b/l  Lab Results:  Recent Labs    08/29/19 0359 08/30/19 0432  WBC 10.3 10.1  HGB 9.8* 9.9*  HCT 30.9* 30.5*  PLT 256 289   BMET Recent Labs    08/29/19 0359 08/30/19 0432  NA 135 138  K 3.2* 3.9  CL 99 102  CO2 25 26  GLUCOSE 105* 104*  BUN 21 19  CREATININE 1.03* 1.12*  CALCIUM 6.0* 6.5*   PT/INR No results for input(s): LABPROT, INR in the last 72 hours. CMP     Component Value Date/Time   NA 138 08/30/2019 0432   K 3.9 08/30/2019 0432   CL 102 08/30/2019 0432   CO2 26 08/30/2019 0432   GLUCOSE 104 (H) 08/30/2019 0432   BUN 19 08/30/2019 0432   CREATININE 1.12 (H) 08/30/2019 0432   CALCIUM 6.5 (L) 08/30/2019 0432   PROT 4.4 (L) 08/30/2019 0432   ALBUMIN 1.6  (L) 08/30/2019 0432   AST 25 08/30/2019 0432   ALT 20 08/30/2019 0432   ALKPHOS 93 08/30/2019 0432   BILITOT 0.9 08/30/2019 0432   GFRNONAA 45 (L) 08/30/2019 0432   GFRAA 52 (L) 08/30/2019 0432   Lipase     Component Value Date/Time   LIPASE 48 08/16/2019 2332       Studies/Results: DG CHEST PORT 1 VIEW  Result Date: 08/30/2019 CLINICAL DATA:  Hypoxia. EXAM: PORTABLE CHEST 1 VIEW COMPARISON:  08/28/2019 FINDINGS: A right PICC terminates near the superior cavoatrial junction. Enteric tube has been removed. The cardiomediastinal silhouette is unchanged. Lung volumes remain low with elevation of the right hemidiaphragm. Asymmetric right basilar airspace opacity is unchanged. There is minimal scarring or atelectasis in the left mid lung. No large pleural effusion or pneumothorax is identified. IMPRESSION: Unchanged right basilar opacity which may reflect pneumonia or atelectasis. Electronically Signed   By: Sebastian Ache M.D.   On: 08/30/2019 09:19    Anti-infectives: Anti-infectives (From admission, onward)   Start     Dose/Rate Route Frequency Ordered Stop   08/24/19 0600  ceFAZolin (ANCEF) IVPB 2g/100 mL premix        2 g 200 mL/hr over 30 Minutes Intravenous On call to O.R. 08/23/19 1622 08/24/19 1202   08/21/19 1000  Ampicillin-Sulbactam (UNASYN) 3 g in sodium chloride 0.9 %  100 mL IVPB  Status:  Discontinued        3 g 200 mL/hr over 30 Minutes Intravenous Every 6 hours 08/21/19 5379 08/29/19 1413       Assessment/Plan Rheumatoid arthritis-chronic steroid use CAD ? CHF vs aspiration/bilateral pleural effusionsR>L  HTN HLD Hypothyroidism GERD PUD AKI- improved, Cr 1.12 Subperiosteal abscess at tooth 20 - completed abx ABL Anemia - hgb 9.9 and stable  SBO S/P dx laparoscopy, exploratory laparotomy, small bowel resection 08/24/19 Dr. Ninfa Linden - POD#6 - Ileus resolving. Tolerating FLD and passing flatus. Adv diet per speech - Cont TID dressing changes to  midline - Mobilize, PT - Pulm toilet   FEN -FLD- per speech, bowel regimen, replace K and Mg VTE -SCD's, Lovenox ID -Ancef preop;Unasyn completed for dental abscess. No abx indicated from our standpoint Foley - External  Follow up - Dr. Ninfa Linden   LOS: 13 days    Jillyn Ledger , [facility] Surgery 08/30/2019, 9:41 AM Please see Amion for pager number during day hours 7:00am-4:30pm

## 2019-08-31 DIAGNOSIS — I5042 Chronic combined systolic (congestive) and diastolic (congestive) heart failure: Secondary | ICD-10-CM

## 2019-08-31 LAB — CBC WITH DIFFERENTIAL/PLATELET
Abs Immature Granulocytes: 1.02 10*3/uL — ABNORMAL HIGH (ref 0.00–0.07)
Basophils Absolute: 0.1 10*3/uL (ref 0.0–0.1)
Basophils Relative: 0 %
Eosinophils Absolute: 0 10*3/uL (ref 0.0–0.5)
Eosinophils Relative: 0 %
HCT: 31.6 % — ABNORMAL LOW (ref 36.0–46.0)
Hemoglobin: 10.2 g/dL — ABNORMAL LOW (ref 12.0–15.0)
Immature Granulocytes: 8 %
Lymphocytes Relative: 4 %
Lymphs Abs: 0.6 10*3/uL — ABNORMAL LOW (ref 0.7–4.0)
MCH: 30.9 pg (ref 26.0–34.0)
MCHC: 32.3 g/dL (ref 30.0–36.0)
MCV: 95.8 fL (ref 80.0–100.0)
Monocytes Absolute: 0.4 10*3/uL (ref 0.1–1.0)
Monocytes Relative: 3 %
Neutro Abs: 11.2 10*3/uL — ABNORMAL HIGH (ref 1.7–7.7)
Neutrophils Relative %: 85 %
Platelets: 375 10*3/uL (ref 150–400)
RBC: 3.3 MIL/uL — ABNORMAL LOW (ref 3.87–5.11)
RDW: 15.1 % (ref 11.5–15.5)
WBC: 13.2 10*3/uL — ABNORMAL HIGH (ref 4.0–10.5)
nRBC: 0 % (ref 0.0–0.2)

## 2019-08-31 LAB — COMPREHENSIVE METABOLIC PANEL
ALT: 18 U/L (ref 0–44)
AST: 22 U/L (ref 15–41)
Albumin: 2 g/dL — ABNORMAL LOW (ref 3.5–5.0)
Alkaline Phosphatase: 96 U/L (ref 38–126)
Anion gap: 9 (ref 5–15)
BUN: 23 mg/dL (ref 8–23)
CO2: 26 mmol/L (ref 22–32)
Calcium: 7.3 mg/dL — ABNORMAL LOW (ref 8.9–10.3)
Chloride: 103 mmol/L (ref 98–111)
Creatinine, Ser: 1.14 mg/dL — ABNORMAL HIGH (ref 0.44–1.00)
GFR calc Af Amer: 51 mL/min — ABNORMAL LOW (ref >60)
GFR calc non Af Amer: 44 mL/min — ABNORMAL LOW (ref >60)
Glucose, Bld: 116 mg/dL — ABNORMAL HIGH (ref 70–99)
Potassium: 3.8 mmol/L (ref 3.5–5.1)
Sodium: 138 mmol/L (ref 135–145)
Total Bilirubin: 0.6 mg/dL (ref 0.3–1.2)
Total Protein: 5.1 g/dL — ABNORMAL LOW (ref 6.5–8.1)

## 2019-08-31 LAB — PHOSPHORUS: Phosphorus: 3.6 mg/dL (ref 2.5–4.6)

## 2019-08-31 LAB — GLUCOSE, CAPILLARY
Glucose-Capillary: 106 mg/dL — ABNORMAL HIGH (ref 70–99)
Glucose-Capillary: 109 mg/dL — ABNORMAL HIGH (ref 70–99)
Glucose-Capillary: 115 mg/dL — ABNORMAL HIGH (ref 70–99)
Glucose-Capillary: 82 mg/dL (ref 70–99)
Glucose-Capillary: 83 mg/dL (ref 70–99)
Glucose-Capillary: 97 mg/dL (ref 70–99)

## 2019-08-31 LAB — MAGNESIUM: Magnesium: 2.4 mg/dL (ref 1.7–2.4)

## 2019-08-31 MED ORDER — HYDROMORPHONE HCL 1 MG/ML IJ SOLN: 0.5000 mg | INTRAMUSCULAR | Status: DC | PRN

## 2019-08-31 MED ORDER — GLYCERIN (LAXATIVE) 2.1 G RE SUPP
1.0000 | RECTAL | Status: DC | PRN
  Filled 2019-08-31: qty 1

## 2019-08-31 MED ORDER — DAKINS (1/4 STRENGTH) 0.125 % EX SOLN
Freq: Three times a day (TID) | CUTANEOUS | Status: DC
  Filled 2019-08-31: qty 473

## 2019-08-31 MED ORDER — BISACODYL 10 MG RE SUPP
10.0000 mg | Freq: Once | RECTAL | Status: AC
  Administered 2019-08-31: 10 mg via RECTAL
  Filled 2019-08-31: qty 1

## 2019-08-31 MED ORDER — METOPROLOL TARTRATE 25 MG PO TABS
25.0000 mg | ORAL_TABLET | Freq: Two times a day (BID) | ORAL | Status: DC
  Administered 2019-08-31 – 2019-09-02 (×5): 25 mg via ORAL
  Filled 2019-08-31 (×5): qty 1

## 2019-08-31 MED ORDER — SENNOSIDES-DOCUSATE SODIUM 8.6-50 MG PO TABS
1.0000 | ORAL_TABLET | Freq: Two times a day (BID) | ORAL | Status: DC
  Administered 2019-08-31 – 2019-09-02 (×4): 1 via ORAL
  Filled 2019-08-31 (×4): qty 1

## 2019-08-31 MED ORDER — METRONIDAZOLE IN NACL 5-0.79 MG/ML-% IV SOLN
500.0000 mg | Freq: Three times a day (TID) | INTRAVENOUS | Status: DC
  Administered 2019-08-31 – 2019-09-02 (×6): 500 mg via INTRAVENOUS
  Filled 2019-08-31 (×6): qty 100

## 2019-08-31 MED ORDER — SODIUM CHLORIDE 0.9 % IV SOLN
2.0000 g | Freq: Two times a day (BID) | INTRAVENOUS | Status: DC
  Administered 2019-08-31 (×2): 2 g via INTRAVENOUS
  Filled 2019-08-31 (×2): qty 2

## 2019-08-31 NOTE — Progress Notes (Signed)
PROGRESS NOTE    Pamela Dunn  UYQ:[id number] DOB: 1931-08-30 DOA: 08/16/2019 PCP: Pearson Grippe, MD   No chief complaint on file.  Brief Narrative:  Pamela Dunn is a 86 y.o. female with a history of cardiomyopathy, rheumatoid arthritis, hypothyroidism. Patient presents secondary to abdominal pain and found to have a small bowel obstruction with transition point. General surgery consulted for co-management.    Assessment & Plan:   Principal Problem:   SBO (small bowel obstruction) (HCC) Active Problems:   Hyperlipidemia   GERD   Rheumatoid arthritis (HCC)   Hypothyroidism   Chronic combined systolic and diastolic congestive heart failure (HCC)   Pressure injury of skin   Small bowel obstruction s/p diagnostic laparaoscopy, exploratory laparotomy, small bowel resection on 6/16 by general surgery CT scan significant for SBO with transition point in the mid abdomen General surgery on board, appreciate recs Tolerating dysphagia 3 diet TID dressing changes to midline  Further management per general surgery  Volume Overload, currently improving History of non-ischemic cardiomyopathy Chronic combined systolic and diastolic heart failure EF of 40-45% from 06/16/2019 with associated grade I diastolic heart dysfunction Patient is not on diuretic therapy as an outpatient Initial chest x-ray showed moderate pulmonary vascular congestion, repeat on 6/22 showed right basilar opacity pneumonia vs atelectasis Was started on IV Lasix 40 mg BID, will hold off for now as volume overload has currently improved and patient BP is currently soft Daily weights, strict I's and O's Monitor closely  Possible HCAP vs aspiration  Abnormal Chest X Ray Currently afebrile, with leukocytosis (on steroids) CXR 6/22 with right basilar opacity, pneumonia or atelectasis SLP on board Start IV cefepime, metronidazole Monitor closely  Hypotension ??orthostatic  History of hypertension D/C IV Lasix, avoid  Dilaudid use for now Hold home pedal, Toprol for now Monitor closely, orthostatic vitals every shift  Possible dental abscess Noted left mandibular infection in the past, being followed by her dentist CT maxillofacial significant for 5 mm abscess Dr. Kristin Bruins consulted, not convinced of abscess, recommend outpatient follow up, pt also not wanting any dental procedure at this time  Rheumatoid arthritis On prednisone 5 mg daily as an outpatient, continue  Hypothyroidism  Continue synthroid   Normocytic anemia/chronic disease Likely 2/2 recent surgery No evidence of bleeding Daily CBC, monitor closely  Hyperlipidemia On Zetia and Coenzyme Q10 as an outpatient  GERD  Continue PPI, maalox  DVT prophylaxis: lovenox Code Status: full Family Communication: Discussed extensively with son at bedside on 08/31/2019 Disposition:   Status is: Inpatient  Remains inpatient appropriate because:Inpatient level of care appropriate due to severity of illness   Dispo: The patient is from: Home              Anticipated d/c is to: pending              Anticipated d/c date is: > 3 days              Patient currently is not medically stable to d/c.  Consultants:   General surgery  Procedures:  S/p diagnostic laparaoscopy, exploratory laparotomy, small bowel resection on 6/16 by general surgery  Antimicrobials: Anti-infectives (From admission, onward)   Start     Dose/Rate Route Frequency Ordered Stop   08/31/19 1400  metroNIDAZOLE (FLAGYL) IVPB 500 mg     Discontinue     500 mg 100 mL/hr over 60 Minutes Intravenous Every 8 hours 08/31/19 1145     08/31/19 1245  ceFEPIme (MAXIPIME) 2 g in  sodium chloride 0.9 % 100 mL IVPB     Discontinue     2 g 200 mL/hr over 30 Minutes Intravenous Every 12 hours 08/31/19 1150     08/24/19 0600  ceFAZolin (ANCEF) IVPB 2g/100 mL premix        2 g 200 mL/hr over 30 Minutes Intravenous On call to O.R. 08/23/19 1622 08/24/19 1202   08/21/19 1000   Ampicillin-Sulbactam (UNASYN) 3 g in sodium chloride 0.9 % 100 mL IVPB  Status:  Discontinued        3 g 200 mL/hr over 30 Minutes Intravenous Every 6 hours 08/21/19 0928 08/29/19 1413     Subjective: Reported no BM for the past couple of days, stated she has some gas feeling in her abdomen.  Noted to drop her blood pressure while walking with PT, possible orthostatic.  Able to tolerate her diet, but still with poor appetite.  Reported abdominal pain status post dressing change.    Objective: Vitals:   08/31/19 1132 08/31/19 1206 08/31/19 1340 08/31/19 1714  BP: (!) 103/47  (!) 132/58 (!) 158/86  Pulse: (!) 107  94 85  Resp:   20 20  Temp:   97.9 F (36.6 C)   TempSrc:      SpO2: (!) 88% 94% 96% 96%  Weight:      Height:        Intake/Output Summary (Last 24 hours) at 08/31/2019 1739 Last data filed at 08/31/2019 1350 Gross per 24 hour  Intake 510 ml  Output 900 ml  Net -390 ml   Filed Weights   08/29/19 0500 08/30/19 0520 08/31/19 0458  Weight: 72.7 kg 73.4 kg 76.5 kg    Examination:  General: NAD   Cardiovascular: S1, S2 present  Respiratory: CTAB  Abdomen: Soft, +tender, nondistended, bowel sounds present, dressing C/D/I  Musculoskeletal: No bilateral pedal edema noted, noted bilateral upper extremity edema  Skin: Normal  Psychiatry: Normal mood   Data Reviewed: I have personally reviewed following labs and imaging studies  CBC: Recent Labs  Lab 08/26/19 0538 08/26/19 0538 08/27/19 0345 08/28/19 0353 08/29/19 0359 08/30/19 0432 08/31/19 0445  WBC 23.9*   < > 22.3* 15.4* 10.3 10.1 13.2*  NEUTROABS 22.6*  --   --  12.9* 7.6 7.6 11.2*  HGB 10.8*   < > 10.3* 11.2* 9.8* 9.9* 10.2*  HCT 34.7*   < > 32.4* 34.9* 30.9* 30.5* 31.6*  MCV 100.9*   < > 97.0 97.8 97.5 96.8 95.8  PLT 232   < > 253 288 256 289 375   < > = values in this interval not displayed.    Basic Metabolic Panel: Recent Labs  Lab 08/27/19 0345 08/28/19 0353 08/29/19 0359  08/30/19 0432 08/31/19 0445  NA 138 140 135 138 138  K 3.1* 3.4* 3.2* 3.9 3.8  CL 102 102 99 102 103  CO2 26 25 25 26 26   GLUCOSE 75 90 105* 104* 116*  BUN 19 23 21 19 23   CREATININE 1.15* 1.12* 1.03* 1.12* 1.14*  CALCIUM 6.5* 6.6* 6.0* 6.5* 7.3*  MG 1.8 1.8 1.6* 2.7* 2.4  PHOS 3.0 2.7 2.0* 2.4* 3.6    GFR: Estimated Creatinine Clearance: 33 mL/min (A) (by C-G formula based on SCr of 1.14 mg/dL (H)).  Liver Function Tests: Recent Labs  Lab 08/27/19 0345 08/28/19 0353 08/29/19 0359 08/30/19 0432 08/31/19 0445  AST 29 38 37 25 22  ALT 17 23 23 20 18   ALKPHOS 79 91 96 93 96  BILITOT 1.0 0.9 0.7 0.9 0.6  PROT 4.6* 4.9* 4.3* 4.4* 5.1*  ALBUMIN 1.8* 1.8* 1.6* 1.6* 2.0*    CBG: Recent Labs  Lab 08/31/19 0006 08/31/19 0509 08/31/19 0751 08/31/19 1147 08/31/19 1709  GLUCAP 115* 97 83 82 109*     Recent Results (from the past 240 hour(s))  Surgical pcr screen     Status: Abnormal   Collection Time: 08/24/19  7:31 AM   Specimen: Nasal Mucosa; Nasal Swab  Result Value Ref Range Status   MRSA, PCR NEGATIVE NEGATIVE Final   Staphylococcus aureus POSITIVE (A) NEGATIVE Final    Comment: (NOTE) The Xpert SA Assay (FDA approved for NASAL specimens in patients 87 years of age and older), is one component of a comprehensive surveillance program. It is not intended to diagnose infection nor to guide or monitor treatment. Performed at [facility], [place] [address], [place], [place] [address]          Radiology Studies: DG CHEST PORT 1 VIEW  Result Date: 08/30/2019 CLINICAL DATA:  Hypoxia. EXAM: PORTABLE CHEST 1 VIEW COMPARISON:  08/28/2019 FINDINGS: A right PICC terminates near the superior cavoatrial junction. Enteric tube has been removed. The cardiomediastinal silhouette is unchanged. Lung volumes remain low with elevation of the right hemidiaphragm. Asymmetric right basilar airspace opacity is unchanged. There is minimal scarring or atelectasis in  the left mid lung. No large pleural effusion or pneumothorax is identified. IMPRESSION: Unchanged right basilar opacity which may reflect pneumonia or atelectasis. Electronically Signed   By: Logan Bores M.D.   On: 08/30/2019 09:19        Scheduled Meds: . Chlorhexidine Gluconate Cloth  6 each Topical Daily  . enoxaparin (LOVENOX) injection  40 mg Subcutaneous Q24H  . feeding supplement  237 mL Oral Q24H  . feeding supplement (PRO-STAT SUGAR FREE 64)  30 mL Oral Daily  . levothyroxine  25 mcg Oral Q0600  . metoprolol tartrate  25 mg Oral BID  . multivitamin with minerals  1 tablet Oral Daily  . mupirocin ointment  1 application Nasal BID  . pantoprazole (PROTONIX) IV  40 mg Intravenous QHS  . polyethylene glycol  17 g Oral BID  . predniSONE  5 mg Oral Daily  . senna-docusate  1 tablet Oral BID  . sodium chloride flush  10-40 mL Intracatheter Q12H  . sodium hypochlorite   Irrigation TID   Continuous Infusions: . ceFEPime (MAXIPIME) IV 2 g (08/31/19 1251)  . methocarbamol (ROBAXIN) IV 500 mg (08/31/19 1338)  . metronidazole 500 mg (08/31/19 1422)     LOS: 14 days      Alma Friendly, MD [facility]  08/31/2019, 5:39 PM

## 2019-08-31 NOTE — Progress Notes (Signed)
Physical Therapy Treatment Patient Details Name: Pamela Dunn MRN: [id number] DOB: 1931-07-14 Today's Date: 08/31/2019    History of Present Illness 86 y.o. female with a history of cardiomyopathy, rheumatoid arthritis, hypothyroidism. Patient presents secondary to abdominal pain and found to have a small bowel obstruction with transition point. General surgery consulted for co-management; s/p ex lap with small bowel resection 08/24/19.    PT Comments    Assisted pt OOB to [facility] then recliner. General bed mobility comments: to minimize ABD pain and decrease pt's anxiety about increasing her pain, offered + 2 Total Assist using bed pad to assist with sit/swival to EOB.  "That was nice" stated pt.  Once EOB, pt was able to static sit EOB at Supervision level.General transfer comment: pt has not been OOB in several days, so used + 2 side by side assist for support to transfer from elevated bed to [facility].  Pt was able to stand and supposrt her weight and take a few side steps to complete 1/4 turn.  Allowed pt to sit on BSC x 10 min to encourage a BM as pt stated "it feels like a rock and won't come out". Pt appears impacted.  NT noted during peri care.   With increased time on commode, pt c/o increased "not feeling good".  Vitals taken on BSC BP was 68/43, HR 119, RA 99%.  Assisted off BSC same fashion + 2 side by side assis with NT present to perform hygiene.  Pt was UNABLE to move her bowels.  As pt stood, switch BSC with recliner.  BP too low to attempt ambulation.  Positioned in recliner with several pillows for comfort and pressure relief to her sacrum. General Gait Details: unable due to hypotension while on [facility]  Prior to admit, pt lived home alone thus will need ST Rehab at SNF to regain her prior level of mobility   Follow Up Recommendations  SNF;Supervision/Assistance - 24 hour     Equipment Recommendations  None recommended by PT    Recommendations for Other Services       Precautions /  Restrictions Precautions Precautions: Fall Precaution Comments: B UE RA Restrictions Weight Bearing Restrictions: No Other Position/Activity Restrictions: WBAT    Mobility  Bed Mobility Overal bed mobility: Needs Assistance Bed Mobility: Supine to Sit     Supine to sit: +2 for physical assistance;+2 for safety/equipment;Total assist     General bed mobility comments: to minimize ABD pain and decrease pt's anxiety about increasing her pain, offered + 2 Total Assist using bed pad to assist with sit/swival to EOB.  "That was nice" stated pt.  Once EOB, pt was able to static sit EOB at Supervision level.  Transfers Overall transfer level: Needs assistance   Transfers: Sit to/from Stand;Stand Pivot Transfers Sit to Stand: Max assist;+2 physical assistance Stand pivot transfers: Max assist;+2 physical assistance       General transfer comment: pt has not been OOB in several days, so used + 2 side by side assist for support to transfer from elevated bed to [facility].  Pt was able to stand and supposrt her weight and take a few side steps to complete 1/4 turn.  Allowed pt to sit on BSC x 10 min to encourage a BM as pt stated "it feels like a rock and won't come out".  With increased time on commode, pt c/o increased "not feeling good".  Vitals taken on BSC BP was 68/43, HR 119, RA 99%.  Assisted off BSC same  fashion + 2 side by side assis with NT present to perform hygiene.  Pt was UNABLE to move her bowels.  As pt stood, switch BSC with recliner.  BP too low to attempt ambulation.  Positioned in recliner with several pillows for comfort and pressure relief to her sacrum.  Ambulation/Gait             General Gait Details: unable due to hypotension while on [facility]   Stairs             Wheelchair Mobility    Modified Rankin (Stroke Patients Only)       Balance                                            Cognition Arousal/Alertness: Awake/alert Behavior During  Therapy: WFL for tasks assessed/performed Overall Cognitive Status: Within Functional Limits for tasks assessed                                 General Comments: pt has high anxiety/worry so required freq positve reinforcement throughout session "your doing good"      Exercises      General Comments        Pertinent Vitals/Pain Pain Assessment: Faces Faces Pain Scale: Hurts little more Pain Location: abdomen/incision with mobility Pain Descriptors / Indicators: Grimacing Pain Intervention(s): Monitored during session;Patient requesting pain meds-RN notified;Ice applied    Home Living                      Prior Function            PT Goals (current goals can now be found in the care plan section) Progress towards PT goals: Progressing toward goals    Frequency    Min 2X/week      PT Plan Current plan remains appropriate    Co-evaluation              AM-PAC PT "6 Clicks" Mobility   Outcome Measure  Help needed turning from your back to your side while in a flat bed without using bedrails?: A Lot Help needed moving from lying on your back to sitting on the side of a flat bed without using bedrails?: A Lot Help needed moving to and from a bed to a chair (including a wheelchair)?: A Lot Help needed standing up from a chair using your arms (e.g., wheelchair or bedside chair)?: A Lot Help needed to walk in hospital room?: A Lot Help needed climbing 3-5 steps with a railing? : Total 6 Click Score: 11    End of Session Equipment Utilized During Treatment: Gait belt Activity Tolerance: Treatment limited secondary to medical complications (Comment) Patient left: in chair;with call bell/phone within reach Nurse Communication: Mobility status;Need for lift equipment PT Visit Diagnosis: Unsteadiness on feet (R26.81);Other abnormalities of gait and mobility (R26.89);Muscle weakness (generalized) (M62.81);Pain     Time: 0950-1020 PT Time  Calculation (min) (ACUTE ONLY): 30 min  Charges:  $Therapeutic Activity: 23-37 mins                     Rica Koyanagi  PTA Acute  Rehabilitation Services Pager      [phone] Office      [phone]

## 2019-08-31 NOTE — TOC Progression Note (Signed)
Transition of Care [facility]) - Progression Note    Patient Details  Name: Pamela Dunn MRN: [id number] Date of Birth: [birth year]  Transition of Care [facility]) CM/SW Contact  Michaeline Eckersley, Olegario Messier, RN Phone Number: 08/31/2019, 11:11 AM  Clinical Narrative: 1. 1.7 mi [facility], [facility] [address] [place], [place] [address] [phone] Overall rating Below average 2. 2 mi [facility] at the [facility] [address] [place], [place] [address] [phone] Overall rating Below average 3. 2.1 mi [address] at [place], [place] [address] [place], [place] [address] [phone] Overall rating Much below average 4. 2.2 mi [facility] [employer] [address] [place], [place] [address] [phone] Overall rating Much above average 5. 2.2 mi [facility] [address] [place], [place] [address] [phone] Overall rating Average 6. 2.7 mi [facility] [address] [place], [place] [address] [phone] Overall rating Above average 7. 3.2 mi [facility] [address] [place], [place] [address] [phone] Overall rating Below average 8. 3.3 mi [facility] [address] [place], [place] [address] [phone] Overall rating Below average 9. 3.5 mi [facility] [address] [place], [place] [address] [phone] Overall rating Average 10. 4.3 mi [facility] [address] [place], [place] [address] [phone] Overall rating Below average 11. 4.7 mi [facility] [employer] [address] [place], [place] [address] [phone] Overall rating Much above average 12. 5.2 mi [facility] [address] [place], [place] [address] [phone] Overall rating Much above  average 13. 6.2 mi [facility] [address] [place], [place] [address] [phone] Overall rating Average 14. 8.1 mi [facility] [address] [place], [place] [address] [phone] Overall rating Above average 15. 8.1 mi [facility] [address] [place], [place] [address] [phone] Overall rating Much below average <Previous 1 2 3  All Next> Data last updated: Aug 03, 2019 To explore and do    16. 9.7 mi [facility] [address] [place], [place] [phone] Overall rating Below average 17. 10 mi [facility] [address] [place], [place] [phone] Overall rating Much above average 18. 11.4 mi [place] [facility] [address] [place], [place] [phone] Overall rating Much above average 19. 13.5 [facility] [address] [place], [place] [phone] Overall rating Much below average 20. 13.5 mi [facility] [address] [place], [place] [phone] Overall rating Much below average 21. 14.5 mi [facility] [address] [place], [place] [phone] Overall rating Much below average 22. 14.9 mi The [phone] & [facility] [place] [address] [place], [place] [phone] Overall rating Much below average 23. 15.1 mi [facility] [address] [place], [place] [phone] Overall rating Below average 24. 15.3 mi [facility] Nursing and [facility] [address] [place], [place] [phone] Overall rating Above average 25. 16 mi [facility] [address] [place], [place] [phone] Overall rating Much above average 26. 16.2 mi [facility] [address] [place], [place] [phone]  [phone] Overall rating Much above average 27. 16.6 mi [facility] [address] [place], [place] [address] [phone] Overall rating Below average 28. 17.2 mi [facility] [place], [place] [address] [phone] Overall rating Below average 29. 57.8 [facility] [address] [place], [place] [address] [phone] Overall rating Much below average 30. 19.4 mi [address] at [employer] at [place], [place] [address] [phone] Overall rating Much above average <Previous 1 2 3  All Next> Data last updated: Aug 03, 2019 To explore and do 26. 20.6 mi [facility] and [facility] [address] [place], [place] [address] [phone] Overall rating Below average 32. 20.7 [place], [place] [address] [phone] Overall rating Average 33. 60.1 [facility] [address] [place], [place] [address] [phone] Overall rating Below average 34. 21.1 [place], [place] [address] [phone] Overall rating Much above average 35. [place] [address] [place], [place] [address] [phone] Overall rating Below average 36. 22.4 mi [address] [place], [place] [address] [phone] Overall rating Below average 37. 22.7 mi [facility] [place] [address] [place], [place] [address] [phone] Overall rating Above average 38. 22.8 [address] [place], [place] [address] [phone] Overall rating Much above average 39. 22.9 mi [place], [place] [address] [phone] Overall rating Average 40. [place], [place] [address] [phone] Overall rating Much below average 41. 24.4 mi [facility] [address] [place], [place] [address] [phone] Overall rating Much below average 42. 24.6 [facility] [address] [place], [place] [address] [phone] Overall rating Much below average 43. 24.6 mi [place], [place] [address] [phone] Overall rating Much below average 44. 24.9 mi [facility] [place],  [address] [phone] Overall rating Much below average <Previous 1 2 3  All Next> Data last updated: Aug 03, 2019 To explore and do  Expected Discharge Plan: [place] Barriers to Discharge: Continued Medical Work up  Expected Discharge Plan and Services Expected Discharge Plan: [place]   Discharge Planning Services: CM Consult   Living arrangements for the past 2 months: Single Family Home                                       Social Determinants of Health (SDOH) Interventions    Readmission Risk Interventions No flowsheet data found.

## 2019-08-31 NOTE — Progress Notes (Signed)
Pt refused orthostatic VS and ambulation. She stated PT has not tried to ambulate her beyond her chair and she did not feel comfortable trying to ambulate with the nursing staff.

## 2019-08-31 NOTE — Progress Notes (Addendum)
7 Days Post-Op    CC: Abdominal pain  Subjective: Patient in bed this AM.  She has a wick in place.  She has not been out of bed.  Nursing staff says it takes four people to move her in and out of bed.  She tolerated full liquids well and has an order for a dysphagia 3 diet this a.m.  Her midline incision still has some necrotic fat along the edges.  Is also has some greenish colored drainage on the dressing.  See picture below.  Objective: Vital signs in last 24 hours: Temp:  [97.6 F (36.4 C)-98.8 F (37.1 C)] 97.6 F (36.4 C) (06/23 0514) Pulse Rate:  [85-92] 88 (06/23 0514) Resp:  [18-19] 18 (06/23 0514) BP: (133-187)/(56-92) 163/86 (06/23 0514) SpO2:  [93 %-97 %] 93 % (06/23 0514) Weight:  [76.5 kg] 76.5 kg (06/23 0458) Last BM Date: 08/30/19 120 PO 200 IV recorded Urine 500 No BM recorded Afebrile, VSS BP up some Labs stable CXR 6/22:  Unchanged right basilar opacity which may reflect pneumonia or atelectasis.  Intake/Output from previous day: 06/22 0701 - 06/23 0700 In: 320 [P.O.:120; IV Piggyback:200] Out: 500 [Urine:500] Intake/Output this shift: No intake/output data recorded.  General appearance: alert, cooperative and no distress Resp: clear to auscultation bilaterally GI: Soft, sore, she is very tender with just gentle cleansing of the abdominal wound edges.  Positive bowel sounds.  She says she had a small bowel movement yesterday. Some green drainage on the dressing as noted in pictures below.        Lab Results:  Recent Labs    08/30/19 0432 08/31/19 0445  WBC 10.1 13.2*  HGB 9.9* 10.2*  HCT 30.5* 31.6*  PLT 289 375    BMET Recent Labs    08/30/19 0432 08/31/19 0445  NA 138 138  K 3.9 3.8  CL 102 103  CO2 26 26  GLUCOSE 104* 116*  BUN 19 23  CREATININE 1.12* 1.14*  CALCIUM 6.5* 7.3*   PT/INR No results for input(s): LABPROT, INR in the last 72 hours.  Recent Labs  Lab 08/27/19 0345 08/28/19 0353 08/29/19 0359 08/30/19 0432  08/31/19 0445  AST 29 38 37 25 22  ALT 17 23 23 20 18   ALKPHOS 79 91 96 93 96  BILITOT 1.0 0.9 0.7 0.9 0.6  PROT 4.6* 4.9* 4.3* 4.4* 5.1*  ALBUMIN 1.8* 1.8* 1.6* 1.6* 2.0*     Lipase     Component Value Date/Time   LIPASE 48 08/16/2019 2332     Medications: . calcium carbonate  1 tablet Oral TID  . Chlorhexidine Gluconate Cloth  6 each Topical Daily  . docusate sodium  100 mg Oral BID  . enoxaparin (LOVENOX) injection  40 mg Subcutaneous Q24H  . feeding supplement  237 mL Oral Q24H  . feeding supplement (PRO-STAT SUGAR FREE 64)  30 mL Oral Daily  . furosemide  40 mg Intravenous BID  . levothyroxine  25 mcg Oral Q0600  . metoprolol tartrate  2.5 mg Intravenous Q6H  . multivitamin with minerals  1 tablet Oral Daily  . mupirocin ointment  1 application Nasal BID  . pantoprazole (PROTONIX) IV  40 mg Intravenous QHS  . polyethylene glycol  17 g Oral BID  . predniSONE  5 mg Oral Daily  . sodium chloride flush  10-40 mL Intracatheter Q12H    Assessment/Plan Rheumatoid arthritis-chronic steroid use CAD ? CHF vs aspiration/bilateral pleural effusionsR>L  HTN HLD Hypothyroidism GERD PUD AKI- improved,  Cr 1.12 Subperiosteal abscess at tooth 20 - completed abx ABL Anemia - hgb 9.9 and stable  SBO S/P dx laparoscopy, exploratory laparotomy, small bowel resection 08/24/19 Dr. Ninfa Linden - POD#7 -Ileus resolving. Tolerating FLD and passing flatus. Adv diet per speech -Cont TID dressing changes to midline - Mobilize, PT - Pulm toilet  FEN -FLD>>D3 this AM VTE -SCD's, Lovenox ID -Ancef preop;Unasyn completed for dental abscess. No abx indicated from our standpoint Foley - External  Follow up - Dr. Ninfa Linden  Plan: Continue current treatment advance diet as tolerated.  Add some Dakin's solution as a wetting agent for her open abdominal wound.  Her major issue keeping her in the hospital now, is tolerating D3 diet, and mobilization.  Will defer to Medicine on  mobilization.     LOS: 14 days    Pamela Dunn 08/31/2019 Please see Amion

## 2019-08-31 NOTE — Consult Note (Signed)
Pt alert and aware sitting up in chair. Family member present at time of visit. Pt states she wants to get better so she can go home. She is ready to go home with the Shaune Pollack but is not ready to leave her family. I offered caring and supportive presence, listening ear, prayers and blessings. Pt was grateful for visit. Follow-up visits will be offered.

## 2019-08-31 NOTE — Progress Notes (Signed)
  Speech Language Pathology Treatment: Dysphagia  Patient Details Name: Pamela Dunn MRN: [id number] DOB: 1931/09/20 Today's Date: 08/31/2019 Time: 9381-8299 SLP Time Calculation (min) (ACUTE ONLY): 10 min  Assessment / Plan / Recommendation Clinical Impression  Today pt seen to assure tolerance of dietary advancement - progressed to dys3/thin.  Son was present and began feeding pt mashed potatoes and juice.  Pt with poor dentition - and thus consumes softer foods prior to admission.  Pt's son denies pt having dysphagia as does pt.  SLP again requested pt see dentisit as soon as able to manage her decayed dentition due to increased infection risk.  No SLP follow up as pt at baseline level of swallowing.  [name] Kitchen   HPI HPI: 86 y.o. female with history of cardiomyopathy, rheumatoid arthritis, hypothyroidism presents to the ER with complaint of sudden onset of abdominal pain since yesterday evening.  Had multiple episodes of vomiting.  She is s/p surgery and hs been started on a clear liquid diet.  SLP follow up requested to determines readiness for dietary advancement to fulls.      SLP Plan  Continue with current plan of care       Recommendations  Liquids provided via: Cup;Straw Medication Administration: Whole meds with puree Supervision: Full supervision/cueing for compensatory strategies Compensations: Slow rate;Small sips/bites Postural Changes and/or Swallow Maneuvers: Upright 30-60 min after meal;Seated upright 90 degrees                Oral Care Recommendations: Oral care QID;Oral care prior to ice chip/H20;Oral care before and after PO Follow up Recommendations: Other (comment) (TBD) SLP Visit Diagnosis: Dysphagia, unspecified (R13.10) Plan: Continue with current plan of care       GO                [name] 08/31/2019, 1:30 PM  [name], MS [facility] SLP Acute Rehab Services Office [phone]

## 2019-08-31 NOTE — Progress Notes (Signed)
Pharmacy Antibiotic Note  Pamela Dunn is a 86 y.o. female admitted on 08/16/2019 with HCAP.  Pharmacy has been consulted for cefepime dosing.  Plan: Cefepime 2gm IV q12h Follow renal function, cultures and clinical course  Height: 5' (152.4 cm) Weight: 76.5 kg (168 lb 10.4 oz) IBW/kg (Calculated) : 45.5  Temp (24hrs), Avg:98 F (36.7 C), Min:97.6 F (36.4 C), Max:98.8 F (37.1 C)  Recent Labs  Lab 08/27/19 0345 08/28/19 0353 08/29/19 0359 08/30/19 0432 08/31/19 0445  WBC 22.3* 15.4* 10.3 10.1 13.2*  CREATININE 1.15* 1.12* 1.03* 1.12* 1.14*    Estimated Creatinine Clearance: 33 mL/min (A) (by C-G formula based on SCr of 1.14 mg/dL (H)).    Allergies  Allergen Reactions  . Coreg [Carvedilol] Diarrhea     Thank you for allowing pharmacy to be a part of this patient's care.  Arley Phenix RPh 08/31/2019, 11:53 AM

## 2019-09-01 LAB — GLUCOSE, CAPILLARY
Glucose-Capillary: 105 mg/dL — ABNORMAL HIGH (ref 70–99)
Glucose-Capillary: 109 mg/dL — ABNORMAL HIGH (ref 70–99)
Glucose-Capillary: 113 mg/dL — ABNORMAL HIGH (ref 70–99)
Glucose-Capillary: 83 mg/dL (ref 70–99)
Glucose-Capillary: 84 mg/dL (ref 70–99)
Glucose-Capillary: 89 mg/dL (ref 70–99)

## 2019-09-01 LAB — CBC WITH DIFFERENTIAL/PLATELET
Abs Immature Granulocytes: 0.33 10*3/uL — ABNORMAL HIGH (ref 0.00–0.07)
Basophils Absolute: 0 10*3/uL (ref 0.0–0.1)
Basophils Relative: 0 %
Eosinophils Absolute: 0.1 10*3/uL (ref 0.0–0.5)
Eosinophils Relative: 1 %
HCT: 26.7 % — ABNORMAL LOW (ref 36.0–46.0)
Hemoglobin: 8.6 g/dL — ABNORMAL LOW (ref 12.0–15.0)
Immature Granulocytes: 2 %
Lymphocytes Relative: 3 %
Lymphs Abs: 0.7 10*3/uL (ref 0.7–4.0)
MCH: 31.2 pg (ref 26.0–34.0)
MCHC: 32.2 g/dL (ref 30.0–36.0)
MCV: 96.7 fL (ref 80.0–100.0)
Monocytes Absolute: 0.4 10*3/uL (ref 0.1–1.0)
Monocytes Relative: 2 %
Neutro Abs: 19.8 10*3/uL — ABNORMAL HIGH (ref 1.7–7.7)
Neutrophils Relative %: 92 %
Platelets: 331 10*3/uL (ref 150–400)
RBC: 2.76 MIL/uL — ABNORMAL LOW (ref 3.87–5.11)
RDW: 14.9 % (ref 11.5–15.5)
WBC: 21.4 10*3/uL — ABNORMAL HIGH (ref 4.0–10.5)
nRBC: 0 % (ref 0.0–0.2)

## 2019-09-01 LAB — BASIC METABOLIC PANEL
Anion gap: 7 (ref 5–15)
BUN: 29 mg/dL — ABNORMAL HIGH (ref 8–23)
CO2: 26 mmol/L (ref 22–32)
Calcium: 7.4 mg/dL — ABNORMAL LOW (ref 8.9–10.3)
Chloride: 103 mmol/L (ref 98–111)
Creatinine, Ser: 1.49 mg/dL — ABNORMAL HIGH (ref 0.44–1.00)
GFR calc Af Amer: 37 mL/min — ABNORMAL LOW (ref >60)
GFR calc non Af Amer: 32 mL/min — ABNORMAL LOW (ref >60)
Glucose, Bld: 91 mg/dL (ref 70–99)
Potassium: 3.6 mmol/L (ref 3.5–5.1)
Sodium: 136 mmol/L (ref 135–145)

## 2019-09-01 MED ORDER — ENOXAPARIN SODIUM 30 MG/0.3ML ~~LOC~~ SOLN
30.0000 mg | SUBCUTANEOUS | Status: DC
  Administered 2019-09-01 – 2019-09-02 (×2): 30 mg via SUBCUTANEOUS
  Filled 2019-09-01 (×2): qty 0.3

## 2019-09-01 MED ORDER — SODIUM CHLORIDE 0.9 % IV SOLN
2.0000 g | INTRAVENOUS | Status: DC
  Administered 2019-09-01: 2 g via INTRAVENOUS
  Filled 2019-09-01 (×2): qty 2

## 2019-09-01 MED ORDER — PANTOPRAZOLE SODIUM 40 MG PO TBEC
40.0000 mg | DELAYED_RELEASE_TABLET | Freq: Every day | ORAL | Status: DC
  Administered 2019-09-01: 40 mg via ORAL
  Filled 2019-09-01: qty 1

## 2019-09-01 NOTE — Progress Notes (Addendum)
PROGRESS NOTE    Pamela Dunn  GLO:[id number] DOB: 23-Dec-1931 DOA: 08/16/2019 PCP: Pearson Grippe, MD   No chief complaint on file.  Brief Narrative:  Pamela Dunn is a 86 y.o. female with a history of cardiomyopathy, rheumatoid arthritis, hypothyroidism. Patient presents secondary to abdominal pain and found to have a small bowel obstruction with transition point. General surgery consulted for co-management.    Assessment & Plan:   Principal Problem:   SBO (small bowel obstruction) (HCC) Active Problems:   Hyperlipidemia   GERD   Rheumatoid arthritis (HCC)   Hypothyroidism   Chronic combined systolic and diastolic congestive heart failure (HCC)   Pressure injury of skin   Small bowel obstruction s/p diagnostic laparaoscopy, exploratory laparotomy, small bowel resection on 6/16 by general surgery CT scan significant for SBO with transition point in the mid abdomen General surgery on board, appreciate recs Tolerating dysphagia 3 diet TID dressing changes to midline  Further management per general surgery  Volume Overload, currently improving History of non-ischemic cardiomyopathy Chronic combined systolic and diastolic heart failure EF of 40-45% from 06/16/2019 with associated grade I diastolic heart dysfunction Patient is not on diuretic therapy as an outpatient Initial chest x-ray showed moderate pulmonary vascular congestion, repeat on 6/22 showed right basilar opacity pneumonia vs atelectasis Was started on IV Lasix 40 mg BID, will hold off for now as volume overload has currently improved and patient BP is currently soft Daily weights, strict I's and O's Monitor closely  Possible HCAP vs aspiration  Abnormal Chest X Ray Currently afebrile, with rising leukocytosis (on steroids) CXR 6/22 with right basilar opacity, pneumonia or atelectasis SLP on board Continue IV cefepime, metronidazole Monitor closely  AKI Likely 2/2 from recent Lasix use Daily  BMP  Hypotension ??orthostatic  History of hypertension D/C IV Lasix, avoid Dilaudid use for now Hold home bidil, Toprol for now Monitor closely, orthostatic vitals every shift  Possible dental abscess Noted left mandibular infection in the past, being followed by her dentist CT maxillofacial significant for 5 mm abscess Dr. Kristin Bruins consulted, not convinced of abscess, recommend outpatient follow up, pt also not wanting any dental procedure at this time  Rheumatoid arthritis On prednisone 5 mg daily as an outpatient, continue  Hypothyroidism  Continue synthroid   Normocytic anemia/chronic disease Likely 2/2 recent surgery No evidence of bleeding Daily CBC, monitor closely  Hyperlipidemia On Zetia and Coenzyme Q10 as an outpatient  GERD  Continue PPI, maalox  DVT prophylaxis: lovenox Code Status: full Family Communication: Discussed extensively with DIL at bedside on 09/01/2019 Disposition:   Status is: Inpatient  Remains inpatient appropriate because:Inpatient level of care appropriate due to severity of illness   Dispo: The patient is from: Home              Anticipated d/c is to: SNF              Anticipated d/c date is: 1 day              Patient currently is not medically stable to d/c.  Consultants:   General surgery  Procedures:  S/p diagnostic laparaoscopy, exploratory laparotomy, small bowel resection on 6/16 by general surgery   Antimicrobials: Anti-infectives (From admission, onward)   Start     Dose/Rate Route Frequency Ordered Stop   09/01/19 2200  ceFEPIme (MAXIPIME) 2 g in sodium chloride 0.9 % 100 mL IVPB     Discontinue     2 g 200 mL/hr over 30 Minutes  Intravenous Every 24 hours 09/01/19 0740     08/31/19 1400  metroNIDAZOLE (FLAGYL) IVPB 500 mg     Discontinue     500 mg 100 mL/hr over 60 Minutes Intravenous Every 8 hours 08/31/19 1145     08/31/19 1245  ceFEPIme (MAXIPIME) 2 g in sodium chloride 0.9 % 100 mL IVPB  Status:   Discontinued        2 g 200 mL/hr over 30 Minutes Intravenous Every 12 hours 08/31/19 1150 09/01/19 0740   08/24/19 0600  ceFAZolin (ANCEF) IVPB 2g/100 mL premix        2 g 200 mL/hr over 30 Minutes Intravenous On call to O.R. 08/23/19 1622 08/24/19 1202   08/21/19 1000  Ampicillin-Sulbactam (UNASYN) 3 g in sodium chloride 0.9 % 100 mL IVPB  Status:  Discontinued        3 g 200 mL/hr over 30 Minutes Intravenous Every 6 hours 08/21/19 0928 08/29/19 1413     Subjective: Patient reports feeling better today, denies any worsening abdominal pain, nausea/vomiting, fever/chills, worsening cough, shortness of breath, chest pain.  Overall still very deconditioned    Objective: Vitals:   09/01/19 0019 09/01/19 0420 09/01/19 0440 09/01/19 1358  BP: (!) 135/54 (!) 133/53  (!) 148/58  Pulse: 81 79  73  Resp: 20 20  20   Temp: (!) 97.4 F (36.3 C) 98.4 F (36.9 C)  (!) 97.5 F (36.4 C)  TempSrc: Oral Oral  Oral  SpO2: 93% 96%  96%  Weight:   76.8 kg   Height:        Intake/Output Summary (Last 24 hours) at 09/01/2019 1716 Last data filed at 09/01/2019 1508 Gross per 24 hour  Intake 1220 ml  Output 100 ml  Net 1120 ml   Filed Weights   08/30/19 0520 08/31/19 0458 09/01/19 0440  Weight: 73.4 kg 76.5 kg 76.8 kg    Examination:  General: NAD   Cardiovascular: S1, S2 present  Respiratory: CTAB  Abdomen: Soft, nontender, nondistended, bowel sounds present, dressing C/D/I  Musculoskeletal: No bilateral pedal edema noted, noted bilateral upper extremity edema  Skin: Normal  Psychiatry: Normal mood   Data Reviewed: I have personally reviewed following labs and imaging studies  CBC: Recent Labs  Lab 08/28/19 0353 08/29/19 0359 08/30/19 0432 08/31/19 0445 09/01/19 0422  WBC 15.4* 10.3 10.1 13.2* 21.4*  NEUTROABS 12.9* 7.6 7.6 11.2* 19.8*  HGB 11.2* 9.8* 9.9* 10.2* 8.6*  HCT 34.9* 30.9* 30.5* 31.6* 26.7*  MCV 97.8 97.5 96.8 95.8 96.7  PLT 288 256 289 375 331    Basic  Metabolic Panel: Recent Labs  Lab 08/27/19 0345 08/27/19 0345 08/28/19 0353 08/29/19 0359 08/30/19 0432 08/31/19 0445 09/01/19 0422  NA 138   < > 140 135 138 138 136  K 3.1*   < > 3.4* 3.2* 3.9 3.8 3.6  CL 102   < > 102 99 102 103 103  CO2 26   < > 25 25 26 26 26   GLUCOSE 75   < > 90 105* 104* 116* 91  BUN 19   < > 23 21 19 23  29*  CREATININE 1.15*   < > 1.12* 1.03* 1.12* 1.14* 1.49*  CALCIUM 6.5*   < > 6.6* 6.0* 6.5* 7.3* 7.4*  MG 1.8  --  1.8 1.6* 2.7* 2.4  --   PHOS 3.0  --  2.7 2.0* 2.4* 3.6  --    < > = values in this interval not displayed.  GFR: Estimated Creatinine Clearance: 25.3 mL/min (A) (by C-G formula based on SCr of 1.49 mg/dL (H)).  Liver Function Tests: Recent Labs  Lab 08/27/19 0345 08/28/19 0353 08/29/19 0359 08/30/19 0432 08/31/19 0445  AST 29 38 37 25 22  ALT 17 23 23 20 18   ALKPHOS 79 91 96 93 96  BILITOT 1.0 0.9 0.7 0.9 0.6  PROT 4.6* 4.9* 4.3* 4.4* 5.1*  ALBUMIN 1.8* 1.8* 1.6* 1.6* 2.0*    CBG: Recent Labs  Lab 09/01/19 0014 09/01/19 0415 09/01/19 0744 09/01/19 1142 09/01/19 1612  GLUCAP 109* 83 84 89 113*     Recent Results (from the past 240 hour(s))  Surgical pcr screen     Status: Abnormal   Collection Time: 08/24/19  7:31 AM   Specimen: Nasal Mucosa; Nasal Swab  Result Value Ref Range Status   MRSA, PCR NEGATIVE NEGATIVE Final   Staphylococcus aureus POSITIVE (A) NEGATIVE Final    Comment: (NOTE) The Xpert SA Assay (FDA approved for NASAL specimens in patients 4 years of age and older), is one component of a comprehensive surveillance program. It is not intended to diagnose infection nor to guide or monitor treatment. Performed at [facility], [place] [address] [place], [place] [address]          Radiology Studies: No results found.      Scheduled Meds: . Chlorhexidine Gluconate Cloth  6 each Topical Daily  . enoxaparin (LOVENOX) injection  30 mg Subcutaneous Q24H  . feeding  supplement  237 mL Oral Q24H  . feeding supplement (PRO-STAT SUGAR FREE 64)  30 mL Oral Daily  . levothyroxine  25 mcg Oral Q0600  . metoprolol tartrate  25 mg Oral BID  . multivitamin with minerals  1 tablet Oral Daily  . mupirocin ointment  1 application Nasal BID  . pantoprazole  40 mg Oral QHS  . polyethylene glycol  17 g Oral BID  . predniSONE  5 mg Oral Daily  . senna-docusate  1 tablet Oral BID  . sodium chloride flush  10-40 mL Intracatheter Q12H  . sodium hypochlorite   Irrigation TID   Continuous Infusions: . ceFEPime (MAXIPIME) IV    . metronidazole 500 mg (09/01/19 1341)     LOS: 15 days      Alma Friendly, MD [facility]  09/01/2019, 5:16 PM

## 2019-09-01 NOTE — Progress Notes (Signed)
Assumed care of the pt from off going RN. Conditon stable. Cont with plan of care 

## 2019-09-01 NOTE — Final Consult Note (Signed)
Consultant Final Sign-Off Note    Assessment/Final recommendations  NIKOLA BLACKSTON is a 86 y.o. female followed by me for:   SBO S/P dx laparoscopy, exploratory laparotomy, small bowel resection 08/24/19 Dr. Magnus Ivan - POD #8. Ileus resolved. Patient tolerating diet and having bowel function. Okay to [employer] per speech. Continue TID dressing changes to midline. We will arrange follow up in the office. She is still receiving abx for HCAP is disposition appears to be SNF.    Wound care (if applicable): TID WTD dressing changes    Diet at discharge: Okay for soft diet. Adv per speech   Activity at discharge: Do not lift greater than 10lbs for 6 weeks post op   Follow-up appointment:  Dr. Magnus Ivan   Pending results:  Unresulted Labs (From admission, onward) Comment          Start     Ordered   09/01/19 0500  CBC with Differential/Platelet  Daily,   R     Question:  Specimen collection method  Answer:  IV Team=IV Team collect   08/31/19 1800   09/01/19 0500  Basic metabolic panel  Daily,   R     Question:  Specimen collection method  Answer:  IV Team=IV Team collect   08/31/19 1800           Medication recommendations:   Other recommendations:    Thank you for allowing [place] to participate in the care of your patient!  Please consult [place] again if you have further needs for your patient.  Elmer Sow [facility] 09/01/2019 9:17 AM    Subjective   Reports that she is tolerating her D3 diet without n/v or abdominal pain. No abdominal pain at this time. Passing flatus. 6BM's recorded on I/O's yesterday.   Objective  Vital signs in last 24 hours: Temp:  [97.4 F (36.3 C)-98.4 F (36.9 C)] 98.4 F (36.9 C) (06/24 0420) Pulse Rate:  [79-119] 79 (06/24 0420) Resp:  [18-20] 20 (06/24 0420) BP: (68-158)/(43-86) 133/53 (06/24 0420) SpO2:  [83 %-96 %] 96 % (06/24 0420) Weight:  [76.8 kg] 76.8 kg (06/24 0440)  Gen: Awake and alert, NAD Lungs: normal rate and effort Abd: Soft, ND,  NT, +BS. Midline wound similar in appearance to yesterday's picture. No dehiscence or signs of infection. Areas of healthy granulation tissue with some sloughing.   Pertinent labs and Studies: Recent Labs    08/30/19 0432 08/31/19 0445 09/01/19 0422  WBC 10.1 13.2* 21.4*  HGB 9.9* 10.2* 8.6*  HCT 30.5* 31.6* 26.7*   BMET Recent Labs    08/31/19 0445 09/01/19 0422  NA 138 136  K 3.8 3.6  CL 103 103  CO2 26 26  GLUCOSE 116* 91  BUN 23 29*  CREATININE 1.14* 1.49*  CALCIUM 7.3* 7.4*   No results for input(s): LABURIN in the last 72 hours. Results for orders placed or performed during the hospital encounter of 08/16/19  SARS Coronavirus 2 by RT PCR (hospital order, performed in [facility] hospital lab) Nasopharyngeal Nasopharyngeal Swab     Status: None   Collection Time: 08/17/19  3:00 AM   Specimen: Nasopharyngeal Swab  Result Value Ref Range Status   SARS Coronavirus 2 NEGATIVE NEGATIVE Final    Comment: (NOTE) SARS-CoV-2 target nucleic acids are NOT DETECTED. The SARS-CoV-2 RNA is generally detectable in upper and lower respiratory specimens during the acute phase of infection. The lowest concentration of SARS-CoV-2 viral copies this assay can detect is 250 copies / mL.  A negative result does not preclude SARS-CoV-2 infection and should not be used as the sole basis for treatment or other patient management decisions.  A negative result may occur with improper specimen collection / handling, submission of specimen other than nasopharyngeal swab, presence of viral mutation(s) within the areas targeted by this assay, and inadequate number of viral copies (<250 copies / mL). A negative result must be combined with clinical observations, patient history, and epidemiological information. Fact Sheet for Patients:   StrictlyIdeas.no Fact Sheet for Healthcare Providers: BankingDealers.co.za This test is not yet approved or  cleared  by the [place] FDA and has been authorized for detection and/or diagnosis of SARS-CoV-2 by FDA under an Emergency Use Authorization (EUA).  This EUA will remain in effect (meaning this test can be used) for the duration of the COVID-19 declaration under Section 564(b)(1) of the Act, 21 U.S.C. section 360bbb-3(b)(1), unless the authorization is terminated or revoked sooner. Performed at [facility], [place] [address], [place], [place] [address]   Culture, Urine     Status: Abnormal   Collection Time: 08/18/19  5:26 PM   Specimen: Urine, Catheterized  Result Value Ref Range Status   Specimen Description   Final    URINE, CATHETERIZED Performed at [place] [address] [place], [place] [address]    Special Requests   Final    NONE Performed at [facility], [place] [address], [place], [place] [address]    Culture (A)  Final    30,000 COLONIES/mL DIPHTHEROIDS(CORYNEBACTERIUM SPECIES) Standardized susceptibility testing for this organism is not available. Performed at [facility], [place] [address], [place], [place] [address]    Report Status 08/20/2019 FINAL  Final  Surgical pcr screen     Status: Abnormal   Collection Time: 08/24/19  7:31 AM   Specimen: Nasal Mucosa; Nasal Swab  Result Value Ref Range Status   MRSA, PCR NEGATIVE NEGATIVE Final   Staphylococcus aureus POSITIVE (A) NEGATIVE Final    Comment: (NOTE) The Xpert SA Assay (FDA approved for NASAL specimens in patients 25 years of age and older), is one component of a comprehensive surveillance program. It is not intended to diagnose infection nor to guide or monitor treatment. Performed at [facility], [place] [address], [place], [place] [address]     Imaging: No results found.

## 2019-09-01 NOTE — Progress Notes (Addendum)
PHARMACY NOTE -  ANTIBIOTIC & LOVENOX  RENAL DOSE ADJUSTMENT  Patient has been initiated on cefepime for HCAP. LMWH for VTE Px. SCr 1.49, estimated CrCl 25.3 ml/min  Plan: decrease cefepime to 2 gm q24 Decrease LMWH to 30 mg q24 IV  PPI> PO F/u renal function  Herby Abraham, Pharm.D 09/01/2019 7:44 AM

## 2019-09-01 NOTE — Progress Notes (Signed)
Refusing to ambulate without PT available. Condition stable, no complaints at this time

## 2019-09-01 NOTE — TOC Progression Note (Signed)
Transition of Care [facility]) - Progression Note    Patient Details  Name: Pamela Dunn MRN: [id number] Date of Birth: 09/25/1931  [facility] CM/SW Contact  Carnell Beavers, Olegario Messier, RN Phone Number: 09/01/2019, 12:06 PM  Clinical Narrative: [facility] chosen-rep [facility] auth started [id number],LNLGX clinicals-await auth.      Expected Discharge Plan: Skilled Nursing Facility Barriers to Discharge: Insurance Authorization  Expected Discharge Plan and Services Expected Discharge Plan: Skilled Nursing Facility   Discharge Planning Services: CM Consult   Living arrangements for the past 2 months: Single Family Home                                       Social Determinants of Health (SDOH) Interventions    Readmission Risk Interventions No flowsheet data found.

## 2019-09-01 NOTE — Discharge Instructions (Signed)
[facility]      [facility] [phone]  OPEN ABDOMINAL SURGERY: POST OP INSTRUCTIONS  Always review your discharge instruction sheet given to you by the facility where your surgery was performed.  IF YOU HAVE DISABILITY OR FAMILY LEAVE FORMS, YOU MUST BRING THEM TO THE OFFICE FOR PROCESSING.  PLEASE DO NOT GIVE THEM TO YOUR DOCTOR.  1. A prescription for pain medication may be given to you upon discharge.  Take your pain medication as prescribed, if needed.  If narcotic pain medicine is not needed, then you may take acetaminophen (Tylenol) or ibuprofen (Advil) as needed. 2. Take your usually prescribed medications unless otherwise directed. 3. If you need a refill on your pain medication, please contact your pharmacy. They will contact our office to request authorization.  Prescriptions will not be filled after 5pm or on week-ends. 4. You should follow a light diet the first few days after arrival home, such as soup and crackers, pudding, etc.unless your doctor has advised otherwise. A high-fiber, low fat diet can be resumed as tolerated.   Be sure to include lots of fluids daily. Most patients will experience some swelling and bruising on the chest and neck area.  Ice packs will help.  Swelling and bruising can take several days to resolve 5. Most patients will experience some swelling and bruising in the area of the incision. Ice pack will help. Swelling and bruising can take several days to resolve..  6. It is common to experience some constipation if taking pain medication after surgery.  Increasing fluid intake and taking a stool softener will usually help or prevent this problem from occurring.  A mild laxative (Milk of Magnesia or Miralax) should be taken according to package directions if there are no bowel movements after 48 hours. 7.  You may have steri-strips (small skin tapes) in place directly over the incision.  These strips should be left on the skin for 7-10 days.  If your  surgeon used skin glue on the incision, you may shower in 24 hours.  The glue will flake off over the next 2-3 weeks.  Any sutures or staples will be removed at the office during your follow-up visit. You may find that a light gauze bandage over your incision may keep your staples from being rubbed or pulled. You may shower and replace the bandage daily. 8. ACTIVITIES:  You may resume regular (light) daily activities beginning the next day--such as daily self-care, walking, climbing stairs--gradually increasing activities as tolerated.  You may have sexual intercourse when it is comfortable.  Refrain from any heavy lifting or straining until approved by your doctor. a. You may drive when you no longer are taking prescription pain medication, you can comfortably wear a seatbelt, and you can safely maneuver your car and apply brakes b. Return to Work: ___________________________________ 9. You should see your doctor in the office for a follow-up appointment approximately two weeks after your surgery.  Make sure that you call for this appointment within a day or two after you arrive home to insure a convenient appointment time. OTHER INSTRUCTIONS:  _____________________________________________________________ _____________________________________________________________  WHEN TO CALL YOUR DOCTOR: 1. Fever over 101.0 2. Inability to urinate 3. Nausea and/or vomiting 4. Extreme swelling or bruising 5. Continued bleeding from incision. 6. Increased pain, redness, or drainage from the incision. 7. Difficulty swallowing or breathing 8. Muscle cramping or spasms. 9. Numbness or tingling in hands or feet or around lips.  The clinic staff is available to   answer your questions during regular business hours.  Please don't hesitate to call and ask to speak to one of the nurses if you have concerns.  For further questions, please visit [email or web address]  Wet to Dry WOUND CARE: - Change  dressing three times daily - Supplies: sterile saline, kerlex, scissors, ABD pads, tape  1. Remove dressing and all packing carefully, moistening with sterile saline as needed to avoid packing/internal dressing sticking to the wound. 2.   Clean edges of skin around the wound with water/gauze, making sure there is no tape debris or leakage left on skin that could cause skin irritation or breakdown. 3.   Dampen and clean kerlex with sterile saline and pack wound from wound base to skin level, making sure to take note of any possible areas of wound tracking, tunneling and packing appropriately. Wound can be packed loosely. Trim kerlex to size if a whole kerlex is not required. 4.   Cover wound with a dry ABD pad and secure with tape.  5.   Write the date/time on the dry dressing/tape to better track when the last dressing change occurred. - apply any skin protectant/powder if recommended by clinician to protect skin/skin folds. - change dressing as needed if leakage occurs, wound gets contaminated, or patient requests to shower. - You may shower daily with wound open and following the shower the wound should be dried and a clean dressing placed.  - Medical grade tape as well as packing supplies can be found at [facility] on [address]. The remaining supplies can be found at your local drug store, [employer] etc.

## 2019-09-01 NOTE — Plan of Care (Signed)
  Problem: Nutrition: Goal: Adequate nutrition will be maintained Outcome: Progressing   Problem: Coping: Goal: Level of anxiety will decrease Outcome: Progressing   

## 2019-09-02 DIAGNOSIS — E785 Hyperlipidemia, unspecified: Secondary | ICD-10-CM

## 2019-09-02 LAB — BASIC METABOLIC PANEL
Anion gap: 11 (ref 5–15)
BUN: 29 mg/dL — ABNORMAL HIGH (ref 8–23)
CO2: 22 mmol/L (ref 22–32)
Calcium: 7.9 mg/dL — ABNORMAL LOW (ref 8.9–10.3)
Chloride: 103 mmol/L (ref 98–111)
Creatinine, Ser: 1.23 mg/dL — ABNORMAL HIGH (ref 0.44–1.00)
GFR calc Af Amer: 46 mL/min — ABNORMAL LOW (ref >60)
GFR calc non Af Amer: 40 mL/min — ABNORMAL LOW (ref >60)
Glucose, Bld: 106 mg/dL — ABNORMAL HIGH (ref 70–99)
Potassium: 3.7 mmol/L (ref 3.5–5.1)
Sodium: 136 mmol/L (ref 135–145)

## 2019-09-02 LAB — CBC WITH DIFFERENTIAL/PLATELET
Abs Immature Granulocytes: 0.3 10*3/uL — ABNORMAL HIGH (ref 0.00–0.07)
Basophils Absolute: 0 10*3/uL (ref 0.0–0.1)
Basophils Relative: 0 %
Eosinophils Absolute: 0.2 10*3/uL (ref 0.0–0.5)
Eosinophils Relative: 1 %
HCT: 28.2 % — ABNORMAL LOW (ref 36.0–46.0)
Hemoglobin: 9 g/dL — ABNORMAL LOW (ref 12.0–15.0)
Immature Granulocytes: 2 %
Lymphocytes Relative: 3 %
Lymphs Abs: 0.6 10*3/uL — ABNORMAL LOW (ref 0.7–4.0)
MCH: 31 pg (ref 26.0–34.0)
MCHC: 31.9 g/dL (ref 30.0–36.0)
MCV: 97.2 fL (ref 80.0–100.0)
Monocytes Absolute: 0.4 10*3/uL (ref 0.1–1.0)
Monocytes Relative: 2 %
Neutro Abs: 16.1 10*3/uL — ABNORMAL HIGH (ref 1.7–7.7)
Neutrophils Relative %: 92 %
Platelets: 360 10*3/uL (ref 150–400)
RBC: 2.9 MIL/uL — ABNORMAL LOW (ref 3.87–5.11)
RDW: 14.8 % (ref 11.5–15.5)
WBC: 17.6 10*3/uL — ABNORMAL HIGH (ref 4.0–10.5)
nRBC: 0 % (ref 0.0–0.2)

## 2019-09-02 LAB — SARS CORONAVIRUS 2 BY RT PCR (HOSPITAL ORDER, PERFORMED IN ~~LOC~~ HOSPITAL LAB): SARS Coronavirus 2: NEGATIVE

## 2019-09-02 LAB — GLUCOSE, CAPILLARY
Glucose-Capillary: 121 mg/dL — ABNORMAL HIGH (ref 70–99)
Glucose-Capillary: 90 mg/dL (ref 70–99)
Glucose-Capillary: 98 mg/dL (ref 70–99)

## 2019-09-02 MED ORDER — OXYCODONE HCL 5 MG PO TABS: 5.0000 mg | ORAL_TABLET | Freq: Four times a day (QID) | ORAL | 0 refills | Status: AC | PRN

## 2019-09-02 MED ORDER — PRO-STAT SUGAR FREE PO LIQD: 30.0000 mL | Freq: Every day | ORAL | 0 refills | Status: DC

## 2019-09-02 MED ORDER — AMOXICILLIN-POT CLAVULANATE 875-125 MG PO TABS: 1.0000 | ORAL_TABLET | Freq: Two times a day (BID) | ORAL | 0 refills | Status: AC

## 2019-09-02 MED ORDER — SENNOSIDES-DOCUSATE SODIUM 8.6-50 MG PO TABS: 1.0000 | ORAL_TABLET | Freq: Two times a day (BID) | ORAL | Status: DC

## 2019-09-02 MED ORDER — ACETAMINOPHEN 325 MG PO TABS: 650.0000 mg | ORAL_TABLET | Freq: Four times a day (QID) | ORAL | Status: DC | PRN

## 2019-09-02 MED ORDER — POLYETHYLENE GLYCOL 3350 17 G PO PACK: 17.0000 g | PACK | Freq: Two times a day (BID) | ORAL | 0 refills | Status: DC

## 2019-09-02 MED ORDER — ENSURE SURGERY PO LIQD: 237.0000 mL | ORAL | Status: DC

## 2019-09-02 MED ORDER — DAKINS (1/4 STRENGTH) 0.125 % EX SOLN: Freq: Three times a day (TID) | CUTANEOUS | 0 refills | Status: DC

## 2019-09-02 MED ORDER — SIMETHICONE 80 MG PO CHEW: 80.0000 mg | CHEWABLE_TABLET | Freq: Four times a day (QID) | ORAL | 0 refills | Status: DC | PRN

## 2019-09-02 MED ORDER — GLYCERIN (LAXATIVE) 2.1 G RE SUPP
1.0000 | Freq: Once | RECTAL | Status: AC
  Administered 2019-09-02: 1 via RECTAL
  Filled 2019-09-02: qty 1

## 2019-09-02 MED ORDER — PANTOPRAZOLE SODIUM 40 MG PO TBEC: 40.0000 mg | DELAYED_RELEASE_TABLET | Freq: Every day | ORAL | Status: DC

## 2019-09-02 NOTE — Plan of Care (Signed)
  Problem: Health Behavior/Discharge Planning: Goal: Ability to manage health-related needs will improve Outcome: Adequate for Discharge   Problem: Clinical Measurements: Goal: Ability to maintain clinical measurements within normal limits will improve Outcome: Adequate for Discharge Goal: Will remain free from infection Outcome: Adequate for Discharge Goal: Diagnostic test results will improve Outcome: Adequate for Discharge Goal: Respiratory complications will improve Outcome: Adequate for Discharge Goal: Cardiovascular complication will be avoided Outcome: Adequate for Discharge   Problem: Activity: Goal: Risk for activity intolerance will decrease Outcome: Adequate for Discharge   Problem: Nutrition: Goal: Adequate nutrition will be maintained Outcome: Adequate for Discharge   Problem: Coping: Goal: Level of anxiety will decrease Outcome: Adequate for Discharge   Problem: Elimination: Goal: Will not experience complications related to bowel motility Outcome: Adequate for Discharge   Problem: Pain Managment: Goal: General experience of comfort will improve Outcome: Adequate for Discharge   Problem: Safety: Goal: Ability to remain free from injury will improve Outcome: Adequate for Discharge   Problem: Skin Integrity: Goal: Risk for impaired skin integrity will decrease Outcome: Adequate for Discharge   Problem: Education: Goal: Required Educational Video(s) Outcome: Adequate for Discharge   Problem: Clinical Measurements: Goal: Ability to maintain clinical measurements within normal limits will improve Outcome: Adequate for Discharge Goal: Postoperative complications will be avoided or minimized Outcome: Adequate for Discharge   Problem: Skin Integrity: Goal: Demonstration of wound healing without infection will improve Outcome: Adequate for Discharge

## 2019-09-02 NOTE — Progress Notes (Signed)
Report called to nurse at [facility], [facility] to transport to facility, family aware and at bedside and will take pt's personal belongings with them

## 2019-09-02 NOTE — TOC Transition Note (Signed)
Transition of Care [facility]) - CM/SW Discharge Note   Patient Details  Name: Pamela Dunn MRN: [id number] Date of Birth: 1931-07-27  Transition of Care [facility]) CM/SW Contact:  Lanier Clam, RN Phone Number: 09/02/2019, 1:27 PM   Clinical Narrative:d/c today SNF [id number], tel# for nsg to call report #[phone]. PTAR called.       Final next level of care: Skilled Nursing Facility Barriers to Discharge: No Barriers Identified   Patient Goals and CMS Choice Patient states their goals for this hospitalization and ongoing recovery are:: go to rehab CMS Medicare.gov Compare Post Acute Care list provided to:: Patient Choice offered to / list presented to : Patient  Discharge Placement              Patient chooses bed at: [facility] Patient to be transferred to facility by: PTAR Name of family member notified: Bjorn Loser dtr n law [phone] Patient and family notified of of transfer: 09/02/19  Discharge Plan and Services   Discharge Planning Services: CM Consult                                 Social Determinants of Health (SDOH) Interventions     Readmission Risk Interventions No flowsheet data found.

## 2019-09-02 NOTE — Progress Notes (Signed)
Report received from [facility], RN.  Assessment unchanged.  Awaiting PTAR transfer to SNF. Nino Parsley

## 2019-09-02 NOTE — TOC Transition Note (Signed)
Transition of Care [facility]) - CM/SW Discharge Note   Patient Details  Name: Pamela Dunn MRN: [id number] Date of Birth: 03-Apr-1931  Transition of Care [facility]) CM/SW Contact:  Lanier Clam, RN Phone Number: 09/02/2019, 9:44 AM   Clinical Narrative: Iline Oven from [facility] Medicare EHM#[id number] auth eff 6/24-6/29 Cletus Gash rep-fax clinicals to [phone].[facility] rep [facility] can accept.covid ordered rapid. Await covid results, & d/c summary.Will call PTAR once all needed info placed.      Final next level of care: Skilled Nursing Facility Barriers to Discharge: No Barriers Identified   Patient Goals and CMS Choice Patient states their goals for this hospitalization and ongoing recovery are:: go to rehab CMS Medicare.gov Compare Post Acute Care list provided to:: Patient Choice offered to / list presented to : Patient  Discharge Placement              Patient chooses bed at: [facility] Patient to be transferred to facility by: PTAR Name of family member notified: Bjorn Loser dtr n law [phone] Patient and family notified of of transfer: 09/02/19  Discharge Plan and Services   Discharge Planning Services: CM Consult                                 Social Determinants of Health (SDOH) Interventions     Readmission Risk Interventions No flowsheet data found.

## 2019-09-02 NOTE — Discharge Summary (Signed)
Discharge Summary  Pamela Dunn [id number]:[id number] DOB: 1931/06/19  PCP: Pearson Grippe, MD  Admit date: 08/16/2019 Discharge date: 09/02/2019  Time spent: 45 mins  Recommendations for Outpatient Follow-up:  1. Follow-up with general surgery as scheduled 2. Follow-up with PCP once discharged from SNF    Discharge Diagnoses:  Active Hospital Problems   Diagnosis Date Noted  . SBO (small bowel obstruction) (HCC) 08/17/2019  . Pressure injury of skin 08/27/2019  . Hypothyroidism 08/23/2019  . Chronic combined systolic and diastolic congestive heart failure (HCC) 08/23/2019  . Rheumatoid arthritis (HCC) 10/23/2009  . GERD 10/23/2009  . Hyperlipidemia 10/23/2009    Resolved Hospital Problems  No resolved problems to display.    Discharge Condition: Stable  Diet recommendation: Dysphagia 3 diet, thin liquids  Vitals:   09/01/19 2032 09/02/19 0533  BP: (!) 150/61 (!) 165/68  Pulse: 81 79  Resp: 18 20  Temp: 98.4 F (36.9 C) 98 F (36.7 C)  SpO2: 92% 96%    History of present illness:  Pamela Revard Carteris a 86 y.o.female with a history of cardiomyopathy, rheumatoid arthritis, hypothyroidism. Patient presents secondary to abdominal pain and found to have a small bowel obstruction with transition point. General surgery consulted for co-management.     Today, patient denies any new complaints, feels like she wants to have a bowel movement, denies any worsening abdominal pain, chest pain, SOB, fever/chills. Attempted to reach Son, unavailable. Pt stable to d/c to SNF for rehab needs    Hospital Course:  Principal Problem:   SBO (small bowel obstruction) (HCC) Active Problems:   Hyperlipidemia   GERD   Rheumatoid arthritis (HCC)   Hypothyroidism   Chronic combined systolic and diastolic congestive heart failure (HCC)   Pressure injury of skin    SBO s/p diagnostic laparaoscopy, exploratory laparotomy, small bowel resection on 6/16 by general surgery CT scan significant for  SBO with transition point in the mid abdomen General surgery on board, appreciate recs Continue dysphagia 3 diet TID dressing changes to midline as per gen surgery Follow-up with general surgery as scheduled  History of non-ischemic cardiomyopathy Chronic combined systolic and diastolic heart failure EF of 40-45% from 06/16/2019 with associated grade I diastolic heart dysfunction  Initial chest x-ray showed moderate pulmonary vascular congestion, repeat on 6/22 showed right basilar opacity pneumonia vs atelectasis S/P IV Lasix 40 mg BID, currently d/c due to rise in Cr and soft BP Patient is not on diuretic therapy as an outpatient, may need prn lasix for fluid retension Monitor closely for vol overload  Possible HCAP vs aspiration  Abnormal Chest X Ray Currently afebrile, with downtrending leukocytosis (although on steroids) CXR 6/22 with right basilar opacity, pneumonia or atelectasis SLP on board S/P 2 days of IV cefepime, metronidazole-->switch to PO Augmentin BID for a total of 5 days, last day 09/06/19  AKI Improving Likely 2/2 from recent Lasix use  Hypotension Hx of HTN BP improved Resume home bidil, Toprol  Possible dental abscess Noted left mandibular infection in the past, being followed by her dentist CT maxillofacial significant for 5 mm abscess Dr. Kristin Bruins consulted, not convinced of abscess, recommend outpatient follow up, pt also not wanting any dental procedure at this time  Rheumatoid arthritis On prednisone 5 mg daily as an outpatient, continue  Hypothyroidism  Continue synthroid   Normocytic anemia/chronic disease Likely 2/2 recent surgery No evidence of bleeding  Hyperlipidemia On Zetia and Coenzyme Q10 as an outpatient  GERD  Continue PPI  Malnutrition Type:  Nutrition Problem: Inadequate oral intake Etiology: acute illness   Malnutrition Characteristics:  Signs/Symptoms: per patient/family report, other (comment)  (FLD)   Nutrition Interventions:  Interventions: Ensure Enlive (each supplement provides 350kcal and 20 grams of protein), Prostat, MVI, Magic cup   Estimated body mass index is 33.58 kg/m as calculated from the following:   Height as of this encounter: 5' (1.524 m).   Weight as of this encounter: 78 kg.    Procedures:  S/p diagnostic laparaoscopy, exploratory laparotomy, small bowel resection on 6/16 by general surgery   Consultations:  General surgery  Discharge Exam: BP (!) 165/68 (BP Location: Left Arm)   Pulse 79   Temp 98 F (36.7 C) (Oral)   Resp 20   Ht 5' (1.524 m)   Wt 78 kg   SpO2 96%   BMI 33.58 kg/m   General: NAD Cardiovascular: S1, S2 present  Respiratory: CTAB Abdomen: Soft, NT, ND, BS present, midline dressing c/d/i     Discharge Instructions You were cared for by a hospitalist during your hospital stay. If you have any questions about your discharge medications or the care you received while you were in the hospital after you are discharged, you can call the unit and asked to speak with the hospitalist on call if the hospitalist that took care of you is not available. Once you are discharged, your primary care physician will handle any further medical issues. Please note that NO REFILLS for any discharge medications will be authorized once you are discharged, as it is imperative that you return to your primary care physician (or establish a relationship with a primary care physician if you do not have one) for your aftercare needs so that they can reassess your need for medications and monitor your lab values.  Discharge Instructions    Diet - low sodium heart healthy   Complete by: As directed    Discharge wound care:   Complete by: As directed    As per surgery, change dressing TID, wet to dry dressing (Use Dakin's as wetting agent) for midline wound   Increase activity slowly   Complete by: As directed      Allergies as of 09/02/2019       Reactions   Coreg [carvedilol] Diarrhea      Medication List    TAKE these medications   acetaminophen 325 MG tablet Commonly known as: TYLENOL Take 2 tablets (650 mg total) by mouth every 6 (six) hours as needed for mild pain or fever.   amoxicillin-clavulanate 875-125 MG tablet Commonly known as: Augmentin Take 1 tablet by mouth 2 (two) times daily for 5 days.   [employer] Body Pain Ex St 500-32.5 MG Tabs Generic drug: Aspirin-Caffeine Take 1 tablet by mouth daily as needed (pain).   BiDil 20-37.5 MG tablet Generic drug: isosorbide-hydrALAZINE TAKE 1 TABLET BY MOUTH THREE TIMES A DAY   cholecalciferol 1000 units tablet Commonly known as: VITAMIN D Take 1,000 Units by mouth daily.   CoQ10 200 MG Caps Take 200 mg by mouth daily.   ezetimibe 10 MG tablet Commonly known as: ZETIA Take 5 mg by mouth daily.   feeding supplement (PRO-STAT SUGAR FREE 64) Liqd Take 30 mLs by mouth daily.   feeding supplement Liqd Take 237 mLs by mouth daily. Start taking on: September 03, 2019   folic acid 1 MG tablet Commonly known as: FOLVITE Take 1 mg by mouth daily.   ICAPS AREDS 2 PO Take 1  capsule by mouth daily.   metoprolol succinate 50 MG 24 hr tablet Commonly known as: TOPROL-XL TAKE 1 TABLET (50 MG TOTAL) BY MOUTH DAILY. TAKE WITH OR IMMEDIATELY FOLLOWING A MEAL.   moxifloxacin 0.5 % ophthalmic solution Commonly known as: VIGAMOX Place 1 drop into both eyes daily.   OSTEO BI-FLEX ONE PER DAY PO Take 1 tablet by mouth daily.   oxyCODONE 5 MG immediate release tablet Commonly known as: Oxy IR/ROXICODONE Take 1 tablet (5 mg total) by mouth every 6 (six) hours as needed for up to 7 days for severe pain.   pantoprazole 40 MG tablet Commonly known as: PROTONIX Take 1 tablet (40 mg total) by mouth at bedtime.   polyethylene glycol 17 g packet Commonly known as: MIRALAX / GLYCOLAX Take 17 g by mouth 2 (two) times daily.   predniSONE 5 MG tablet Commonly known as:  DELTASONE Take 5 mg by mouth daily.   senna-docusate 8.6-50 MG tablet Commonly known as: Senokot-S Take 1 tablet by mouth 2 (two) times daily.   simethicone 80 MG chewable tablet Commonly known as: MYLICON Chew 1 tablet (80 mg total) by mouth 4 (four) times daily as needed for flatulence.   sodium hypochlorite 0.125 % Soln Commonly known as: DAKIN'S 1/4 STRENGTH Irrigate with as directed 3 (three) times daily. Use as a wetting agent for wet to dry dressing for midline wound   Synthroid 25 MCG tablet Generic drug: levothyroxine Take 25 mcg by mouth daily.            Discharge Care Instructions  (From admission, onward)         Start     Ordered   09/02/19 0000  Discharge wound care:       Comments: As per surgery, change dressing TID, wet to dry dressing (Use Dakin's as wetting agent) for midline wound   09/02/19 1121         Allergies  Allergen Reactions  . Coreg [Carvedilol] Diarrhea    Contact information for follow-up providers    Abigail Miyamoto, MD. Go on 09/21/2019.   Specialty: General Surgery Why: . Please arrive 30 minutes prior to your appointment for paperwork. Please bring a copy of your photo ID and insurance card to the appointment.  Contact information: [address] [place] [address] [phone]        Pearson Grippe, MD. Schedule an appointment as soon as possible for a visit in 1 week(s).   Specialty: Internal Medicine Contact information: [address] [place] [address] [place] [address] [phone]            Contact information for after-discharge care    Destination    [facility] .   Service: Skilled Nursing Contact information: [address] [place] [address] [phone]                   The results of significant diagnostics from this hospitalization (including imaging, microbiology, ancillary and laboratory) are listed below for reference.     Significant Diagnostic Studies: CT ABDOMEN PELVIS W CONTRAST  Result Date: 08/23/2019 CLINICAL DATA:  Possible small-bowel obstruction diagnosed on 08/17/2019. 2 bowel movements. Persistent pain. EXAM: CT ABDOMEN AND PELVIS WITH CONTRAST TECHNIQUE: Multidetector CT imaging of the abdomen and pelvis was performed using the standard protocol following bolus administration of intravenous contrast. CONTRAST:  OMNIPAQUE IOHEXOL 300 MG/ML  SOLN COMPARISON:  Plain film 08/22/2019.  Most recent CT 08/17/2019. FINDINGS: Lower chest:  Bibasilar atelectasis. Left lower lobe calcified granuloma. Mild cardiomegaly with multivessel coronary artery atherosclerosis. Small right and trace left pleural effusions, new since the prior CT. Hepatobiliary: Normal liver. Vicarious excretion of contrast via the gallbladder. No biliary duct dilatation. Pancreas: Normal pancreas for age, with atrophy but no acute inflammation. Spleen: Normal in size, without focal abnormality. Adrenals/Urinary Tract: Normal adrenal glands. Bilateral renal cortical thinning. Interpolar left renal too small to characterize lesion, most likely a cyst. No hydronephrosis. Foley catheter within the urinary bladder, which is decompressed. Stomach/Bowel: Tiny hiatal hernia. A nasogastric tube terminates at the body of the stomach. The colon is relatively decompressed. Decompressed terminal ileum. Suggestion of 2 adjacent areas of small bowel narrowing/transition, including on coronal images 26 and 33. There is no bowel wall thickening. Persistent moderate interloop mesenteric fluid and edema. No pneumatosis or free intraperitoneal air. Vascular/Lymphatic: Advanced aortic and branch vessel atherosclerosis. No abdominopelvic adenopathy. Reproductive: Uterine atrophy.  No adnexal mass. Other: Small volume abdominopelvic fluid, slightly increased. Musculoskeletal: Osteopenia.  Advanced lumbosacral spondylosis. IMPRESSION: 1. Persistent high-grade partial  small bowel obstruction. Suggestion of 2 adjacent areas of small bowel narrowing/transition, as can be seen with closed loop obstruction. Although there is no bowel wall thickening, persistent mesenteric interloop fluid and abdominopelvic ascites, for which complicating ischemia cannot be excluded. 2. Development of small right greater than left pleural effusions. 3.  Aortic Atherosclerosis (ICD10-I70.0). Electronically Signed   By: Abigail Miyamoto M.D.   On: 08/23/2019 16:06   CT Abdomen Pelvis W Contrast  Result Date: 08/17/2019 CLINICAL DATA:  Vomiting.  Abdominal pain. EXAM: CT ABDOMEN AND PELVIS WITH CONTRAST TECHNIQUE: Multidetector CT imaging of the abdomen and pelvis was performed using the standard protocol following bolus administration of intravenous contrast. CONTRAST:  52mL OMNIPAQUE IOHEXOL 300 MG/ML  SOLN COMPARISON:  September 06, 2013 FINDINGS: Lower chest: The lung bases are clear. The heart is enlarged. Hepatobiliary: The liver is normal. Status post cholecystectomy.There is no biliary ductal dilation. Pancreas: Normal contours without ductal dilatation. No peripancreatic fluid collection. Spleen: Unremarkable. Adrenals/Urinary Tract: --Adrenal glands: Unremarkable. --Right kidney/ureter: No hydronephrosis or radiopaque kidney stones. --Left kidney/ureter: No hydronephrosis or radiopaque kidney stones. --Urinary bladder: Unremarkable. Stomach/Bowel: --Stomach/Duodenum: No hiatal hernia or other gastric abnormality. Normal duodenal course and caliber. --Small bowel: There are dilated loops of small bowel in the low midline abdomen. There is a transition point in the mid abdomen (axial series 2, image 43). There is no pneumatosis. There is no free air. The distal small bowel is decompressed. --Colon: Unremarkable. --Appendix: Not visualized. No right lower quadrant inflammation or free fluid. Vascular/Lymphatic: Atherosclerotic calcification is present within the non-aneurysmal abdominal aorta, without  hemodynamically significant stenosis. --No retroperitoneal lymphadenopathy. --No mesenteric lymphadenopathy. --No pelvic or inguinal lymphadenopathy. Reproductive: Unremarkable Other: There is a small amount of free fluid in the patient's abdomen and pelvis. Mesenteric edema is noted. Musculoskeletal. No acute displaced fractures. IMPRESSION: 1. Small bowel obstruction with transition point in the mid abdomen. 2. Small amount of free fluid in the patient's abdomen and pelvis, likely reactive. No free air. Aortic Atherosclerosis (ICD10-I70.0). Electronically Signed   By: Constance Holster M.D.   On: 08/17/2019 02:24   CT MAXILLOFACIAL W CONTRAST  Result Date: 08/21/2019 CLINICAL DATA:  Mass, lump for swelling of maxillofacial structures. Abscess left tooth. EXAM: CT MAXILLOFACIAL WITH CONTRAST TECHNIQUE: Multidetector CT imaging of the maxillofacial structures was performed with intravenous contrast. Multiplanar CT image reconstructions were also generated. CONTRAST:  66mL OMNIPAQUE IOHEXOL 300 MG/ML  SOLN COMPARISON:  None. FINDINGS: Osseous: Along the labial aspect of the left paramedian mandible is a probable 5 mm subperiosteal abscess. Mild subcutaneous edema is seen along the chin is attributed to related cellulitis. Tooth 20 shows a periapical erosion. No evidence of fracture or mandibular dislocation. Advanced cervical spine degeneration with multilevel ankylosis. There is LN toe occipital ankylosis and C2-3 posterior arthrodesis hardware. Bilateral TMJ arthritis. Orbits: Bilateral cataract resection. No acute inflammation or masslike finding Sinuses: Left maxillary sinusitis with near complete opacification Soft tissues: Subcutaneous fat reticulation along the left chin. There is an enteric tube which traverses the right nose and pharynx Limited intracranial: No acute finding IMPRESSION: 1. Probable 5 mm subperiosteal abscess on the labial surface of the left paramedian mandible. There is a periapical  erosion at tooth 20. 2. Left maxillary sinusitis. Electronically Signed   By: Marnee Spring M.D.   On: 08/21/2019 11:29   DG CHEST PORT 1 VIEW  Result Date: 08/30/2019 CLINICAL DATA:  Hypoxia. EXAM: PORTABLE CHEST 1 VIEW COMPARISON:  08/28/2019 FINDINGS: A right PICC terminates near the superior cavoatrial junction. Enteric tube has been removed. The cardiomediastinal silhouette is unchanged. Lung volumes remain low with elevation of the right hemidiaphragm. Asymmetric right basilar airspace opacity is unchanged. There is minimal scarring or atelectasis in the left mid lung. No large pleural effusion or pneumothorax is identified. IMPRESSION: Unchanged right basilar opacity which may reflect pneumonia or atelectasis. Electronically Signed   By: Sebastian Ache M.D.   On: 08/30/2019 09:19   DG CHEST PORT 1 VIEW  Result Date: 08/28/2019 CLINICAL DATA:  Abnormal chest x-ray EXAM: PORTABLE CHEST 1 VIEW COMPARISON:  August 26, 2019 FINDINGS: Persistent opacity in the right base. A new right PICC line terminates near the caval atrial junction. The side port of the NG tube is now above the GE junction with the distal tip just below the GE junction. No other interval changes. IMPRESSION: 1. Persistent opacity in the right base may represent infiltrate/pneumonia versus atelectasis. Recommend follow-up to resolution. 2. The NG tube has been pulled back with the side port well above the GE junction. Recommend advancing at least 10 cm. 3. There is a new right PICC line is in good position. These results will be called to the ordering clinician or representative by the Radiologist Assistant, and communication documented in the PACS or [employer]. Electronically Signed   By: Gerome Sam III M.D   On: 08/28/2019 11:11   DG CHEST PORT 1 VIEW  Result Date: 08/26/2019 CLINICAL DATA:  Hypoxia. EXAM: PORTABLE CHEST 1 VIEW COMPARISON:  One-view chest x-ray 08/16/2019 FINDINGS: The heart size is exaggerated by low lung  volumes. Moderate pulmonary vascular congestion is present. Small effusions are present bilaterally. Bibasilar airspace disease is noted, right greater than left. NG tube courses off the inferior border of the films. Atherosclerotic changes are present at the aortic arch. IMPRESSION: 1. Low lung volumes and moderate pulmonary vascular congestion suggesting congestive heart failure. 2. Small bilateral pleural effusions and bibasilar airspace disease, right greater than left. While this may represent atelectasis, there is significant concern for infection or aspiration at the right base. Electronically Signed   By: Marin Roberts M.D.   On: 08/26/2019 08:21   DG Chest Port 1 View  Result Date: 08/17/2019 CLINICAL DATA:  Abdominal pain EXAM: PORTABLE CHEST 1 VIEW COMPARISON:  None. FINDINGS: The heart size and mediastinal contours are within normal limits. Both lungs are clear. The visualized skeletal structures are unremarkable. IMPRESSION: No active  disease. Electronically Signed   By: Deatra Robinson M.D.   On: 08/17/2019 00:08   DG ABD ACUTE 2+V W 1V CHEST  Result Date: 08/20/2019 CLINICAL DATA:  Abdominal swelling, possible small bowel obstruction. EXAM: DG ABDOMEN ACUTE W/ 1V CHEST COMPARISON:  08/18/2019 and chest x-ray 08/16/2019 FINDINGS: Lungs are adequately inflated demonstrate mild right base opacification likely linear atelectasis and possible small amount of pleural fluid. Mild linear atelectasis left mid to lower lung. Cardiomediastinal silhouette and remainder of the chest is unchanged. Abdominopelvic images demonstrate a nasogastric tube with tip and side-port over the stomach in the midline of the mid abdomen. Bowel gas pattern demonstrates a few mildly dilated air-filled central small bowel loops and few scattered air-fluid levels. No free peritoneal air. Air is present within the colon. Paucity of gas over the rectum. Remainder the exam is unchanged. IMPRESSION: 1. Persistent mildly  dilated air-filled small bowel loops in the central abdomen. 2. Mild right base opacification likely atelectasis and possible small amount of pleural fluid. Linear atelectasis left mid to lower lung. 3. Nasogastric tube with tip and side-port over the stomach in the midline mid abdomen. Electronically Signed   By: Elberta Fortis M.D.   On: 08/20/2019 12:23   DG Abd Portable 1V  Result Date: 08/22/2019 CLINICAL DATA:  Small bowel obstruction. EXAM: PORTABLE ABDOMEN - 1 VIEW COMPARISON:  08/20/2019. FINDINGS: Nasogastric tube terminates in the gastric antrum. Gas is seen in nondilated colon and rectum. No small bowel dilatation. Degenerative changes in the spine and hips. IMPRESSION: Resolving small bowel obstruction. Electronically Signed   By: Leanna Battles M.D.   On: 08/22/2019 09:17   DG Abd Portable 1V  Result Date: 08/18/2019 CLINICAL DATA:  Small-bowel obstruction, follow-up EXAM: PORTABLE ABDOMEN - 1 VIEW COMPARISON:  Portable exam 0744 hours compared to 08/17/2019 FINDINGS: Decreased small bowel distension since prior exam. Few contrast filled loops of minimally prominent small bowel noted in mid abdomen. Scattered gas and stool in nondistended colon. Excreted contrast material in rectum. Atherosclerotic calcifications aorta and iliac arteries. Degenerative changes lumbar spine and BILATERAL hip joints with osseous demineralization. IMPRESSION: Decreased small bowel distension since 08/17/2019. Electronically Signed   By: Ulyses Southward M.D.   On: 08/18/2019 10:46   DG Abd Portable 1V-Small Bowel Obstruction Protocol-initial, 8 hr delay  Result Date: 08/17/2019 CLINICAL DATA:  Evaluate for small bowel obstruction. EXAM: PORTABLE ABDOMEN - 1 VIEW COMPARISON:  August 17, 2019 (1:35 p.m.) FINDINGS: A nasogastric tube is seen with its distal tip overlying the expected region of the gastric antrum. Multiple dilated small bowel loops are again seen within the mid and lower abdomen. These small bowel loops are  unchanged in caliber when compared to the prior study. There is no evidence of free air. No radio-opaque calculi are seen. Radiopaque contrast is noted within the urinary bladder. IMPRESSION: 1. Nasogastric tube positioning, as described above. 2. Stable small bowel obstruction. Electronically Signed   By: Aram Candela M.D.   On: 08/17/2019 22:52   DG Abd Portable 1V  Result Date: 08/17/2019 CLINICAL DATA:  NG tube placement EXAM: PORTABLE ABDOMEN - 1 VIEW COMPARISON:  08/17/2019 FINDINGS: NG tube extends below the hemidiaphragms into the distal stomach. Stomach appears collapsed. Small hiatal hernia noted. Nonspecific gaseous distention of the colon. No obstruction pattern. Degenerative changes of the spine. Aortoiliac atherosclerosis noted. Basilar scarring/atelectasis present. IMPRESSION: NG tube distal stomach. Electronically Signed   By: Judie Petit.  Shick M.D.   On: 08/17/2019 14:03   DG  Abd Portable 1V-Small Bowel Protocol-Position Verification  Result Date: 08/17/2019 CLINICAL DATA:  Nasogastric tube insertion EXAM: PORTABLE ABDOMEN - 1 VIEW COMPARISON:  CT from earlier today FINDINGS: The nasogastric tube loops at the level of the diaphragm, based on prior CT likely looping in a hiatal hernia. Small bowel obstruction with affected loops essentially not covered on this study. Cardiomegaly. Visualized lungs are clear IMPRESSION: The nasogastric tube loops near the diaphragm, suspect it is within a small hiatal hernia based on prior CT. Electronically Signed   By: Marnee Spring M.D.   On: 08/17/2019 11:48   [place] EKG SITE RITE  Result Date: 08/25/2019 If Site Rite image not attached, placement could not be confirmed due to current cardiac rhythm.   Microbiology: Recent Results (from the past 240 hour(s))  Surgical pcr screen     Status: Abnormal   Collection Time: 08/24/19  7:31 AM   Specimen: Nasal Mucosa; Nasal Swab  Result Value Ref Range Status   MRSA, PCR NEGATIVE NEGATIVE Final    Staphylococcus aureus POSITIVE (A) NEGATIVE Final    Comment: (NOTE) The Xpert SA Assay (FDA approved for NASAL specimens in patients 73 years of age and older), is one component of a comprehensive surveillance program. It is not intended to diagnose infection nor to guide or monitor treatment. Performed at [facility], [address], [place], [place] [address]   SARS Coronavirus 2 by RT PCR (hospital order, performed in [facility] hospital lab) Nasopharyngeal Nasopharyngeal Swab     Status: None   Collection Time: 09/02/19  9:28 AM   Specimen: Nasopharyngeal Swab  Result Value Ref Range Status   SARS Coronavirus 2 NEGATIVE NEGATIVE Final    Comment: (NOTE) SARS-CoV-2 target nucleic acids are NOT DETECTED.  The SARS-CoV-2 RNA is generally detectable in upper and lower respiratory specimens during the acute phase of infection. The lowest concentration of SARS-CoV-2 viral copies this assay can detect is 250 copies / mL. A negative result does not preclude SARS-CoV-2 infection and should not be used as the sole basis for treatment or other patient management decisions.  A negative result may occur with improper specimen collection / handling, submission of specimen other than nasopharyngeal swab, presence of viral mutation(s) within the areas targeted by this assay, and inadequate number of viral copies (<250 copies / mL). A negative result must be combined with clinical observations, patient history, and epidemiological information.  Fact Sheet for Patients:   BoilerBrush.com.cy  Fact Sheet for Healthcare Providers: https://pope.com/  This test is not yet approved or  cleared by the [place] FDA and has been authorized for detection and/or diagnosis of SARS-CoV-2 by FDA under an Emergency Use Authorization (EUA).  This EUA will remain in effect (meaning this test can be used) for the duration of  the COVID-19 declaration under Section 564(b)(1) of the Act, 21 U.S.C. section 360bbb-3(b)(1), unless the authorization is terminated or revoked sooner.  Performed at [facility], [address] [place], [place] [address]      Labs: Basic Metabolic Panel: Recent Labs  Lab 08/27/19 0345 08/27/19 0345 08/28/19 5366 08/28/19 0353 08/29/19 0359 08/30/19 0432 08/31/19 0445 09/01/19 0422 09/02/19 0338  NA 138   < > 140   < > 135 138 138 136 136  K 3.1*   < > 3.4*   < > 3.2* 3.9 3.8 3.6 3.7  CL 102   < > 102   < > 99 102 103 103 103  CO2 26   < > 25   < > 25 26 26 26 22   GLUCOSE 75   < > 90   < > 105* 104* 116* 91 106*  BUN 19   < > 23   < > 21 19 23  29* 29*  CREATININE 1.15*   < > 1.12*   < > 1.03* 1.12* 1.14* 1.49* 1.23*  CALCIUM 6.5*   < > 6.6*   < > 6.0* 6.5* 7.3* 7.4* 7.9*  MG 1.8  --  1.8  --  1.6* 2.7* 2.4  --   --   PHOS 3.0  --  2.7  --  2.0* 2.4* 3.6  --   --    < > = values in this interval not displayed.   Liver Function Tests: Recent Labs  Lab 08/27/19 0345 08/28/19 0353 08/29/19 0359 08/30/19 0432 08/31/19 0445  AST 29 38 37 25 22  ALT 17 23 23 20 18   ALKPHOS 79 91 96 93 96  BILITOT 1.0 0.9 0.7 0.9 0.6  PROT 4.6* 4.9* 4.3* 4.4* 5.1*  ALBUMIN 1.8* 1.8* 1.6* 1.6* 2.0*   No results for input(s): LIPASE, AMYLASE in the last 168 hours. No results for input(s): AMMONIA in the last 168 hours. CBC: Recent Labs  Lab 08/29/19 0359 08/30/19 0432 08/31/19 0445 09/01/19 0422 09/02/19 0338  WBC 10.3 10.1 13.2* 21.4* 17.6*  NEUTROABS 7.6 7.6 11.2* 19.8* 16.1*  HGB 9.8* 9.9* 10.2* 8.6* 9.0*  HCT 30.9* 30.5* 31.6* 26.7* 28.2*  MCV 97.5 96.8 95.8 96.7 97.2  PLT 256 289 375 331 360   Cardiac Enzymes: No results for input(s): CKTOTAL, CKMB, CKMBINDEX, TROPONINI in the last 168 hours. BNP: BNP (last 3 results) Recent Labs    08/26/19 0538 08/29/19 0359  BNP 721.4* 103.3*    ProBNP (last 3 results) No results for input(s): PROBNP in  the last 8760 hours.  CBG: Recent Labs  Lab 09/01/19 1612 09/01/19 2030 09/02/19 0016 09/02/19 0550 09/02/19 0720  GLUCAP 113* 105* 121* 98 90       Signed:  Briant Cedar, MD [facility] 09/02/2019, 11:23 AM

## 2019-09-07 ENCOUNTER — Other Ambulatory Visit: Payer: Self-pay | Admitting: Cardiology

## 2019-09-07 DIAGNOSIS — I5022 Chronic systolic (congestive) heart failure: Secondary | ICD-10-CM

## 2019-11-30 ENCOUNTER — Other Ambulatory Visit: Payer: Self-pay | Admitting: Internal Medicine

## 2019-11-30 DIAGNOSIS — Z1231 Encounter for screening mammogram for malignant neoplasm of breast: Secondary | ICD-10-CM

## 2019-12-13 ENCOUNTER — Other Ambulatory Visit: Payer: Self-pay

## 2019-12-13 ENCOUNTER — Ambulatory Visit: Payer: Medicare PPO | Admitting: Cardiology

## 2019-12-13 ENCOUNTER — Encounter: Payer: Self-pay | Admitting: Cardiology

## 2019-12-13 VITALS — BP 121/49 | HR 64 | Resp 15 | Ht 60.0 in | Wt 148.0 lb

## 2019-12-13 DIAGNOSIS — I428 Other cardiomyopathies: Secondary | ICD-10-CM

## 2019-12-13 DIAGNOSIS — I5022 Chronic systolic (congestive) heart failure: Secondary | ICD-10-CM

## 2019-12-13 DIAGNOSIS — I447 Left bundle-branch block, unspecified: Secondary | ICD-10-CM

## 2019-12-13 DIAGNOSIS — G4734 Idiopathic sleep related nonobstructive alveolar hypoventilation: Secondary | ICD-10-CM

## 2019-12-13 NOTE — Progress Notes (Signed)
Primary Physician/Referring:  Jani Gravel, MD  Patient ID: Pamela Dunn, female    DOB: 12-17-1931, 86 y.o.   MRN: [id number]  Chief Complaint  Patient presents with  . Follow-up    6 month  . Cardiomyopathy  . Congestive Heart Failure    HPI: Pamela Dunn  is a 86 y.o. female  with with long-standing history of recurrent rheumatoid arthritis, left bundle branch block, nonischemic cardiomyopathy with negative nuclear stress test and EF 20% in July 2020, hypertension, hyperlipidemia, severe DJD and RA with arthritis and her physical disability due to arthritis and using a cane to walk, also has pain in her hand joints. Nocturnal oximetry in September 2020 revealed severe hypoxemia and she was on nocturnal oxygen supplementation.    She was admitted to the hospital on 08/16/2019 with small bowel obstruction, underwent laparoscopy and small bowel resection on 08/24/2019 and discharged to rehab.  She is now back at home since 10/14/2019.  States that she is presently doing well, has not had any leg edema, dyspnea has remained stable, she has stopped using her home oxygen.  Overall feels well and no specific complaints.  This is her 47-monthoffice visit.    Past Medical History:  Diagnosis Date  . Allergy    seasonal  . Anemia   . Arthritis   . GERD (gastroesophageal reflux disease)   . Hyperlipidemia   . Hypertension     Past Surgical History:  Procedure Laterality Date  . APPENDECTOMY    . BACK SURGERY    . BOWEL RESECTION N/A 08/24/2019   Procedure: SMALL BOWEL RESECTION;  Surgeon: BCoralie Keens MD;  Location: [facility];  Service: General;  Laterality: N/A;  . LAPAROSCOPY N/A 08/24/2019   Procedure: LAPAROSCOPY DIAGNOSTIC;  Surgeon: BCoralie Keens MD;  Location: [facility];  Service: General;  Laterality: N/A;  . LAPAROTOMY N/A 08/24/2019   Procedure: EXPLORATORY LAPAROTOMY;  Surgeon: BCoralie Keens MD;  Location: [facility];  Service: General;  Laterality: N/A;   Social History    Tobacco Use  . Smoking status: Never Smoker  . Smokeless tobacco: Never Used  Substance Use Topics  . Alcohol use: No   Marital Status: Widowed   Review of Systems  Constitutional: Negative for malaise/fatigue.  Cardiovascular: Positive for dyspnea on exertion (stable). Negative for chest pain and leg swelling.  Musculoskeletal: Positive for arthritis and joint pain.  Gastrointestinal: Negative for melena.   Objective      Vitals with BMI 12/13/2019 09/02/2019 09/02/2019  Height _0  - -  Weight 148 lbs - -  BMI 295.6- -  Systolic [id number] Diastolic 49 88 68  Pulse 64 70 79    Physical Exam Constitutional:      General: She is not in acute distress.    Appearance: She is well-developed.     Comments: Short stature  Cardiovascular:     Rate and Rhythm: Normal rate and regular rhythm.     Pulses: Intact distal pulses.     Heart sounds: No murmur heard.  No gallop.      Comments: S1 is normal, S2 is paradoxically split. No JVD. No pedal edema. Pulmonary:     Effort: Pulmonary effort is normal.     Breath sounds: Normal breath sounds.  Abdominal:     General: Bowel sounds are normal.     Palpations: Abdomen is soft.  Musculoskeletal:        General: Deformity (burnt out RA in hands)  present. Normal range of motion.    Radiology: No results found.  Laboratory examination:   CMP Latest Ref Rng & Units 09/02/2019 09/01/2019 08/31/2019  Glucose 70 - 99 mg/dL 106(H) 91 116(H)  BUN 8 - 23 mg/dL 29(H) 29(H) 23  Creatinine 0.44 - 1.00 mg/dL 1.23(H) 1.49(H) 1.14(H)  Sodium 135 - 145 mmol/L 136 136 138  Potassium 3.5 - 5.1 mmol/L 3.7 3.6 3.8  Chloride 98 - 111 mmol/L 103 103 103  CO2 22 - 32 mmol/L _0 Calcium 8.9 - 10.3 mg/dL 7.9(L) 7.4(L) 7.3(L)  Total Protein 6.5 - 8.1 g/dL - - 5.1(L)  Total Bilirubin 0.3 - 1.2 mg/dL - - 0.6  Alkaline Phos 38 - 126 U/L - - 96  AST 15 - 41 U/L - - 22  ALT 0 - 44 U/L - - 18   CBC Latest Ref Rng & Units 09/02/2019 09/01/2019  08/31/2019  WBC 4.0 - 10.5 K/uL 17.6(H) 21.4(H) 13.2(H)  Hemoglobin 12.0 - 15.0 g/dL 9.0(L) 8.6(L) 10.2(L)  Hematocrit 36 - 46 % 28.2(L) 26.7(L) 31.6(L)  Platelets 150 - 400 K/uL 360 331 375    External labs:  Cholesterol, total 235.000 M11/18/2020 HDL40.000  11/15/2019 LDL-C82.000 11/15/2019 Triglycerides183.000 11/15/2019  Hemoglobin9.000 g/dL6/25/2021 Platelets360.000 K/09/02/2019  Creatinine, Serum1.230 mg/09/02/2019 Potassium3.700 mm6/25/2021 MagnesiumN/D ALT (SGPT)18.000 U/L6/23/2021  TSH2.3309/09/2019  Labs 10/25/2018: Serum glucose 76, BUN 27, creatinine 1.08, eGFR 47 mL, potassium 4.6, HB 11.8, HCT 36.5, platelets 288.  Normal indicis.  Total cholesterol 205, triglycerides 224, HDL 49, LDL 111. TSH minimally elevated at 5.990.  PCP 04/26/2018:  Glucose 81, BUN/Cr 21/1.09, eGFR 47, Na/K 144/4.8, AST 21, ALT 8. Alk Phos 50. Rest of CMP normal.  WBC 13.3, RBC 3.71, H/H 11.3/35.3, MCV 96, Platelets 347,  Rest of CBC normal. Total cholesterol 202 triglycerides 86, HDL 57, LDL 128.   Medications   Current Outpatient Medications on File Prior to Visit  Medication Sig Dispense Refill  . Amino Acids-Protein Hydrolys (FEEDING SUPPLEMENT, PRO-STAT SUGAR FREE 64,) LIQD Take 30 mLs by mouth daily. 887 mL 0  . Aspirin-Caffeine (BAYER BACK & BODY PAIN EX ST) 500-32.5 MG TABS Take 1 tablet by mouth daily as needed (pain).     [name] Kitchen BIDIL 20-37.5 MG tablet TAKE 1 TABLET BY MOUTH THREE TIMES A DAY (Patient taking differently: Take 1 tablet by mouth 3 (three) times daily. ) 270 tablet 2  . cholecalciferol (VITAMIN D) 1000 UNITS tablet Take 1,000 Units by mouth daily.     . Coenzyme Q10 (COQ10) 200 MG CAPS Take 200 mg by mouth daily.    [name] Kitchen ezetimibe (ZETIA) 10 MG tablet Take 10 mg by mouth daily.     . folic acid (FOLVITE) 1 MG tablet Take 1 mg by mouth daily.     [name] Kitchen levothyroxine (SYNTHROID) 25 MCG tablet Take 25 mcg by mouth daily.     . metoprolol succinate (TOPROL-XL) 50 MG 24 hr tablet TAKE 1  TABLET (50 MG TOTAL) BY MOUTH DAILY. TAKE WITH OR IMMEDIATELY FOLLOWING A MEAL. 90 tablet 1  . moxifloxacin (VIGAMOX) 0.5 % ophthalmic solution Place 1 drop into both eyes daily.     . predniSONE (DELTASONE) 5 MG tablet Take 5 mg by mouth daily.    [name] Kitchen acetaminophen (TYLENOL) 325 MG tablet Take 2 tablets (650 mg total) by mouth every 6 (six) hours as needed for mild pain or fever. (Patient not taking: Reported on 12/13/2019)     No current facility-administered medications on file prior to visit.  Cardiac Studies:   Lexiscan Myoview Stress Test 09/06/2018: Resting EKG demonstrates normal sinus rhythm.  Left bundle branch block.  Stress EKG is non-diagnostic as it's a pharmacologic stress test. Nuclear images reveal left ventricle to be mildly dilated both in rest and stress images with end-diastolic volume of 262 mL.  The perfusion imaging study demonstrates a moderate sized inferior and inferolateral decreased uptake suggestive of soft tissue attenuation.  In addition there is a very small sized apical septal defect suggestive of ischemia. The left ventricle systolic function calculated by QGS was 24% with global hypokinesis. This represents a high risk study in view of low EF, clinical correlation recommended. Findings may represent non ischemic dilated cardiomyopathy.  Nocturnal oximetry 12/02/2018: SPO2 <88% 23 minutes, <89% 37 minutes, highest SPO2 99%, lowest SPO2 69%.  Oxygen desaturation events 3% (316 events).  Patient prescribed nocturnal oxygen supplementation.  Echocardiogram 06/16/2019:  Left ventricle cavity is normal in size. Mild concentric hypertrophy of the left ventricle. Abnormal septal wall motion due to left bundle branch  block. Mildly depressed LV systolic function with visual EF 40-45%. Doppler evidence of grade I (impaired) diastolic dysfunction, normal LAP.  Trileaflet aortic valve. Trace aortic regurgitation.  Moderate (Grade II) mitral regurgitation.  Moderate  tricuspid regurgitation. Estimated pulmonary artery systolic pressure is 30 mmHg.  No significant change compared to previous study on 11/30/2018. Compared to previous study on 08/11/2018, LVEF is improved from 20%. Mitral regurgitation is less severe.   EKG:   EKG 12/13/2019: Normal sinus rhythm at rate of 61 bpm, left bundle branch block.  No further analysis.  No significant change from prior EKG 06/23/2019   Assessment     ICD-10-CM   1. Chronic systolic heart failure (HCC)  I50.22 EKG 12-Lead  2. Non-ischemic cardiomyopathy ([place])  I42.8   3. LBBB (left bundle branch block)  I44.7   4. Nocturnal hypoxemia.  Resolved 12/13/2019  G47.34    No orders of the defined types were placed in this encounter.  Medications Discontinued During This Encounter  Medication Reason  . [place]well[place]-Glucosamine-Vit D (OSTEO BI-FLEX ONE PER DAY PO) Patient Preference  . feeding supplement (ENSURE SURGERY) LIQD Completed Course  . Multiple Vitamins-Minerals (ICAPS AREDS 2 PO) Patient Preference  . pantoprazole (PROTONIX) 40 MG tablet No longer needed (for PRN medications)  . senna-docusate (SENOKOT-S) 8.6-50 MG tablet No longer needed (for PRN medications)  . simethicone (MYLICON) 80 MG chewable tablet No longer needed (for PRN medications)  . sodium hypochlorite (DAKIN'S 1/4 STRENGTH) 0.125 % SOLN No longer needed (for PRN medications)  . polyethylene glycol (MIRALAX / GLYCOLAX) 17 g packet Patient Preference     Recommendations:    Pamela Dunn  is a 86 y.o. female  with with long-standing history of recurrent rheumatoid arthritis, left bundle branch block, nonischemic cardiomyopathy with negative nuclear stress test and EF 20% in July 2020, hypertension, hyperlipidemia, severe DJD and RA with arthritis and her physical disability due to arthritis and using a cane to walk, also has pain in her hand joints. Nocturnal oximetry in September 2020 revealed severe hypoxemia and she was on nocturnal oxygen  supplementation.    She was admitted to the hospital on 08/16/2019 with small bowel obstruction, underwent laparoscopy and small bowel resection on 08/24/2019 and discharged to rehab.  She is now back at home since 10/14/2019.  She is presently doing well, there is no clinical evidence of heart failure, blood pressure is also well controlled and she is on appropriate medical therapy  including beta-blockers and BiDil.  She does have stage III chronic kidney disease which is stable, she has not been able to tolerate ACE inhibitor's or ARB due to hyperkalemia.  Continue present medication.  Previously noted on hypoxemia now she has had complete resolution of hypoxemia as recommended by home health, she is not using any oxygen and does not feel any fatigue or tiredness.  She does not wish to continue with oxygen therapy.  She is stable from cardiac standpoint I will see her back in 6 months for follow-up.  No changes in the medications were done by me.  I will discontinue home oxygen therapy.   Adrian Prows, MD, [facility] 12/13/2019, 3:43 PM Office: [phone]

## 2019-12-26 ENCOUNTER — Ambulatory Visit: Payer: Medicare PPO | Admitting: Cardiology

## 2020-01-10 ENCOUNTER — Other Ambulatory Visit: Payer: Self-pay

## 2020-01-10 ENCOUNTER — Ambulatory Visit
Admission: RE | Admit: 2020-01-10 | Discharge: 2020-01-10 | Disposition: A | Discharge status: Home / Self Care | Payer: Medicare PPO | Source: Ambulatory Visit | Attending: Internal Medicine | Admitting: Internal Medicine

## 2020-01-10 DIAGNOSIS — Z1231 Encounter for screening mammogram for malignant neoplasm of breast: Secondary | ICD-10-CM

## 2020-03-01 ENCOUNTER — Other Ambulatory Visit: Payer: Self-pay | Admitting: Cardiology

## 2020-03-01 DIAGNOSIS — I5022 Chronic systolic (congestive) heart failure: Secondary | ICD-10-CM

## 2020-03-13 ENCOUNTER — Telehealth: Payer: Self-pay | Admitting: Cardiology

## 2020-04-20 ENCOUNTER — Other Ambulatory Visit: Payer: Self-pay

## 2020-04-20 DIAGNOSIS — I5022 Chronic systolic (congestive) heart failure: Secondary | ICD-10-CM

## 2020-04-20 MED ORDER — BIDIL 20-37.5 MG PO TABS: 1.0000 | ORAL_TABLET | Freq: Three times a day (TID) | ORAL | 2 refills | Status: DC

## 2020-05-28 ENCOUNTER — Other Ambulatory Visit: Payer: Self-pay | Admitting: Cardiology

## 2020-05-28 DIAGNOSIS — I5022 Chronic systolic (congestive) heart failure: Secondary | ICD-10-CM

## 2020-06-11 ENCOUNTER — Ambulatory Visit: Payer: Medicare PPO | Admitting: Cardiology

## 2020-06-11 ENCOUNTER — Encounter: Payer: Self-pay | Admitting: Cardiology

## 2020-06-11 ENCOUNTER — Other Ambulatory Visit: Payer: Self-pay

## 2020-06-11 VITALS — BP 154/73 | HR 63 | Temp 98.2°F | Resp 16 | Ht 60.0 in | Wt 142.0 lb

## 2020-06-11 DIAGNOSIS — I5022 Chronic systolic (congestive) heart failure: Secondary | ICD-10-CM

## 2020-06-11 DIAGNOSIS — I428 Other cardiomyopathies: Secondary | ICD-10-CM

## 2020-06-11 DIAGNOSIS — R55 Syncope and collapse: Secondary | ICD-10-CM

## 2020-06-11 DIAGNOSIS — I10 Essential (primary) hypertension: Secondary | ICD-10-CM

## 2020-06-11 DIAGNOSIS — I447 Left bundle-branch block, unspecified: Secondary | ICD-10-CM

## 2020-06-11 NOTE — Progress Notes (Signed)
Primary Physician/Referring:  Jani Gravel, MD  Patient ID: Pamela Dunn, female    DOB: [birth year], 87 y.o.   MRN: [id number]  Chief Complaint  Patient presents with  . Chronic systolic heart failure   . Follow-up    6 months  . Loss of Consciousness    HPI: Pamela Dunn  is a 87 y.o. female  with with long-standing history of recurrent rheumatoid arthritis, left bundle branch block, nonischemic cardiomyopathy with negative nuclear stress test and EF 20% in July 2020, hypertension, hyperlipidemia, severe DJD and RA with arthritis and her physical disability due to arthritis and using a cane to walk, also has pain in her hand joints.   She was admitted to the hospital on 08/16/2019 with small bowel obstruction, underwent laparoscopy and small bowel resection on 08/24/2019 and discharged to rehab for 2 months.  Patient was at home in usual state of health, was sitting on a chair in the bathroom just about to get ready to shower on 04/02/2020, had sudden onset syncope without any premonitory symptoms.  She did not lose any bowel or bladder disturbances.  Was seen by the PCP, was reassured and lab work was performed.  She has not had any further recurrence. Otherwise states that she is presently doing well, has not had any leg edema, dyspnea has remained stable.  This is her 17-monthoffice visit.    Past Medical History:  Diagnosis Date  . Allergy    seasonal  . Anemia   . Arthritis   . GERD (gastroesophageal reflux disease)   . Hyperlipidemia   . Hypertension     Past Surgical History:  Procedure Laterality Date  . APPENDECTOMY    . BACK SURGERY    . BOWEL RESECTION N/A 08/24/2019   Procedure: SMALL BOWEL RESECTION;  Surgeon: BCoralie Keens MD;  Location: [facility];  Service: General;  Laterality: N/A;  . LAPAROSCOPY N/A 08/24/2019   Procedure: LAPAROSCOPY DIAGNOSTIC;  Surgeon: BCoralie Keens MD;  Location: [facility];  Service: General;  Laterality: N/A;  . LAPAROTOMY N/A 08/24/2019    Procedure: EXPLORATORY LAPAROTOMY;  Surgeon: BCoralie Keens MD;  Location: [facility];  Service: General;  Laterality: N/A;   Social History   Tobacco Use  . Smoking status: Never Smoker  . Smokeless tobacco: Never Used  Substance Use Topics  . Alcohol use: No   Marital Status: Widowed   Review of Systems  Constitutional: Negative for malaise/fatigue.  Cardiovascular: Positive for dyspnea on exertion (stable). Negative for chest pain and leg swelling.  Musculoskeletal: Positive for arthritis and joint pain.  Gastrointestinal: Negative for melena.   Objective      Vitals with BMI 06/11/2020 12/13/2019 09/02/2019  Height _0  _1  -  Weight 142 lbs 148 lbs -  BMI 211.03215.9-  Systolic [id number] Diastolic 73 49 88  Pulse 63 64 70    Physical Exam Constitutional:      General: She is not in acute distress.    Appearance: She is well-developed.     Comments: Short stature  Cardiovascular:     Rate and Rhythm: Normal rate and regular rhythm.     Pulses: Intact distal pulses.     Heart sounds: No murmur heard. No gallop.      Comments: S1 is normal, S2 is paradoxically split. No JVD. No pedal edema. Pulmonary:     Effort: Pulmonary effort is normal.     Breath sounds: Normal breath sounds.  Abdominal:     General: Bowel sounds are normal.     Palpations: Abdomen is soft.  Musculoskeletal:        General: Deformity (burnt out RA in hands) present. Normal range of motion.    Radiology: No results found.  Laboratory examination:   CMP Latest Ref Rng & Units 09/02/2019 09/01/2019 08/31/2019  Glucose 70 - 99 mg/dL 106(H) 91 116(H)  BUN 8 - 23 mg/dL 29(H) 29(H) 23  Creatinine 0.44 - 1.00 mg/dL 1.23(H) 1.49(H) 1.14(H)  Sodium 135 - 145 mmol/L 136 136 138  Potassium 3.5 - 5.1 mmol/L 3.7 3.6 3.8  Chloride 98 - 111 mmol/L 103 103 103  CO2 22 - 32 mmol/L _0 Calcium 8.9 - 10.3 mg/dL 7.9(L) 7.4(L) 7.3(L)  Total Protein 6.5 - 8.1 g/dL - - 5.1(L)  Total Bilirubin  0.3 - 1.2 mg/dL - - 0.6  Alkaline Phos 38 - 126 U/L - - 96  AST 15 - 41 U/L - - 22  ALT 0 - 44 U/L - - 18   CBC Latest Ref Rng & Units 09/02/2019 09/01/2019 08/31/2019  WBC 4.0 - 10.5 K/uL 17.6(H) 21.4(H) 13.2(H)  Hemoglobin 12.0 - 15.0 g/dL 9.0(L) 8.6(L) 10.2(L)  Hematocrit 36.0 - 46.0 % 28.2(L) 26.7(L) 31.6(L)  Platelets 150 - 400 K/uL 360 331 375    External labs:   Labs 04/02/2020:  Hb 12.6/HCT 39.3, platelets 306, normal indicis.  Serum glucose 94 mg, BUN 24, creatinine 1.17, EGFR 42 mL, potassium 4.9, sodium 139.  LFTs normal.  Urine analysis normal.  Labs 02/20/2020:  Total cholesterol 180, triglycerides 157, HDL 49, LDL 100.  TSH normal.  Cholesterol, total 235.000 M11/18/2020 HDL40.000  11/15/2019 LDL-C82.000 11/15/2019 Triglycerides183.000 11/15/2019  Hemoglobin9.000 g/dL6/25/2021 Platelets360.000 K/09/02/2019  Creatinine, Serum1.230 mg/09/02/2019 Potassium3.700 mm6/25/2021 MagnesiumN/D ALT (SGPT)18.000 U/L6/23/2021  TSH2.3309/09/2019  Labs 10/25/2018: Serum glucose 76, BUN 27, creatinine 1.08, eGFR 47 mL, potassium 4.6, HB 11.8, HCT 36.5, platelets 288.  Normal indicis.  Total cholesterol 205, triglycerides 224, HDL 49, LDL 111. TSH minimally elevated at 5.990.  PCP 04/26/2018:  Glucose 81, BUN/Cr 21/1.09, eGFR 47, Na/K 144/4.8, AST 21, ALT 8. Alk Phos 50. Rest of CMP normal.  WBC 13.3, RBC 3.71, H/H 11.3/35.3, MCV 96, Platelets 347,  Rest of CBC normal. Total cholesterol 202 triglycerides 86, HDL 57, LDL 128.   Medications   Current Outpatient Medications on File Prior to Visit  Medication Sig Dispense Refill  . Aspirin-Caffeine 500-32.5 MG TABS Take 1 tablet by mouth daily as needed (pain).     [name] Kitchen BIDIL 20-37.5 MG tablet TAKE 1 TABLET BY MOUTH THREE TIMES A DAY 270 tablet 2  . cholecalciferol (VITAMIN D) 1000 UNITS tablet Take 1,000 Units by mouth daily.     . Coenzyme Q10 (COQ10) 200 MG CAPS Take 200 mg by mouth daily.    [name] Kitchen ezetimibe (ZETIA) 10 MG tablet Take 10  mg by mouth daily.     . folic acid (FOLVITE) 1 MG tablet Take 1 mg by mouth daily.     [name] Kitchen levothyroxine (SYNTHROID) 25 MCG tablet Take 25 mcg by mouth daily.     . metoprolol succinate (TOPROL-XL) 50 MG 24 hr tablet TAKE 1 TABLET (50 MG TOTAL) BY MOUTH DAILY. TAKE WITH OR IMMEDIATELY FOLLOWING A MEAL. 90 tablet 1  . predniSONE (DELTASONE) 5 MG tablet Take 5 mg by mouth daily.     No current facility-administered medications on file prior to visit.    Cardiac Studies:   [employer]  Myoview Stress Test 09/06/2018: Resting EKG demonstrates normal sinus rhythm.  Left bundle branch block.  Stress EKG is non-diagnostic as it's a pharmacologic stress test. Nuclear images reveal left ventricle to be mildly dilated both in rest and stress images with end-diastolic volume of 892 mL.  The perfusion imaging study demonstrates a moderate sized inferior and inferolateral decreased uptake suggestive of soft tissue attenuation.  In addition there is a very small sized apical septal defect suggestive of ischemia. The left ventricle systolic function calculated by QGS was 24% with global hypokinesis. This represents a high risk study in view of low EF, clinical correlation recommended. Findings may represent non ischemic dilated cardiomyopathy.  Nocturnal oximetry 12/02/2018: SPO2 <88% 23 minutes, <89% 37 minutes, highest SPO2 99%, lowest SPO2 69%.  Oxygen desaturation events 3% (316 events).  Patient prescribed nocturnal oxygen supplementation. Now off O2 due to resolution of fatigue.  Echocardiogram 06/16/2019:  Left ventricle cavity is normal in size. Mild concentric hypertrophy of the left ventricle. Abnormal septal wall motion due to left bundle branch  block. Mildly depressed LV systolic function with visual EF 40-45%. Doppler evidence of grade I (impaired) diastolic dysfunction, normal LAP.  Trileaflet aortic valve. Trace aortic regurgitation.  Moderate (Grade II) mitral regurgitation.  Moderate tricuspid  regurgitation. Estimated pulmonary artery systolic pressure is 30 mmHg.  No significant change compared to previous study on 11/30/2018. Compared to previous study on 08/11/2018, LVEF is improved from 20%. Mitral regurgitation is less severe.   EKG:     EKG 06/11/2020: Normal sinus rhythm at rate of 57 bpm, left atrial enlargement, left bundle branch block.  No further analysis.   No significant change from EKG 12/13/2019   Assessment     ICD-10-CM   1. Cardiac syncope  R55   2. Chronic systolic heart failure (HCC)  I50.22 EKG 12-Lead  3. LBBB (left bundle branch block)  I44.7   4. Non-ischemic cardiomyopathy ([place])  I42.8   5. Primary hypertension  I10    No orders of the defined types were placed in this encounter.  Medications Discontinued During This Encounter  Medication Reason  . acetaminophen (TYLENOL) 325 MG tablet Error  . Amino Acids-Protein Hydrolys (FEEDING SUPPLEMENT, PRO-STAT SUGAR FREE 64,) LIQD Error  . moxifloxacin (VIGAMOX) 0.5 % ophthalmic solution Error     Recommendations:    Pamela Dunn  is a 87 y.o. female  with with long-standing history of recurrent rheumatoid arthritis, left bundle branch block, nonischemic cardiomyopathy with negative nuclear stress test and EF 20% in July 2020, hypertension, hyperlipidemia, severe DJD and RA with arthritis and her physical disability due to arthritis and using a cane to walk, also has pain in her hand joints.   Severely systolic dysfunction, she underwent extensive bowel surgery in June 2021 without any cardiac complications and had a 58-monthrecovery and has done well.  She continues to live independently.  Patient was at home in usual state of health, was sitting on a chair in the bathroom just about to get ready to shower on 04/02/2020, had sudden onset syncope without any premonitory symptoms.  I am concerned about high degree of conduction system disease or this could be related to ventricular tachyarrhythmias as well.   She has not had any further episodes since then, but in view of sudden onset syncope which is cardiac, I have recommended that we proceed with loop recorder implantation.  Patient is agreeable to this.  Also repeat echocardiogram.  Her blood pressure was elevated today  but blood pressure at home has been under excellent control as she has physical therapy and occupational therapy and has done well and blood pressure during those episodes has been well controlled.  Also in view of her syncopal spell, I would like to avoid making any medication changes for now.  I will see her back in 2 months for follow-up after the loop recorder implantation.  Risks and benefits including hematoma, bleeding, trauma, rare incidence of pneumothorax has been discussed.  I spent 40 minutes with the patient regarding discussion of loop recorder implantation, review of external labs and management of her heart failure.    Adrian Prows, MD, [facility] 06/11/2020, 2:29 PM Office: [phone]

## 2020-06-13 ENCOUNTER — Ambulatory Visit: Payer: Medicare PPO | Admitting: Cardiology

## 2020-07-11 ENCOUNTER — Inpatient Hospital Stay (HOSPITAL_COMMUNITY)
Admission: EM | Admit: 2020-07-11 | Discharge: 2020-07-13 | DRG: 243 | Disposition: A | Discharge status: Home Health | Payer: Medicare PPO | Attending: Cardiology | Admitting: Cardiology

## 2020-07-11 ENCOUNTER — Emergency Department (HOSPITAL_COMMUNITY): Payer: Medicare PPO

## 2020-07-11 DIAGNOSIS — E875 Hyperkalemia: Secondary | ICD-10-CM | POA: Diagnosis present

## 2020-07-11 DIAGNOSIS — Z20822 Contact with and (suspected) exposure to covid-19: Secondary | ICD-10-CM | POA: Diagnosis present

## 2020-07-11 DIAGNOSIS — Z888 Allergy status to other drugs, medicaments and biological substances status: Secondary | ICD-10-CM | POA: Diagnosis not present

## 2020-07-11 DIAGNOSIS — Z79899 Other long term (current) drug therapy: Secondary | ICD-10-CM | POA: Diagnosis not present

## 2020-07-11 DIAGNOSIS — I5022 Chronic systolic (congestive) heart failure: Secondary | ICD-10-CM

## 2020-07-11 DIAGNOSIS — Z7952 Long term (current) use of systemic steroids: Secondary | ICD-10-CM | POA: Diagnosis not present

## 2020-07-11 DIAGNOSIS — M069 Rheumatoid arthritis, unspecified: Secondary | ICD-10-CM | POA: Diagnosis present

## 2020-07-11 DIAGNOSIS — I110 Hypertensive heart disease with heart failure: Secondary | ICD-10-CM | POA: Diagnosis present

## 2020-07-11 DIAGNOSIS — Z79890 Hormone replacement therapy (postmenopausal): Secondary | ICD-10-CM | POA: Diagnosis not present

## 2020-07-11 DIAGNOSIS — Z7982 Long term (current) use of aspirin: Secondary | ICD-10-CM | POA: Diagnosis not present

## 2020-07-11 DIAGNOSIS — Z959 Presence of cardiac and vascular implant and graft, unspecified: Secondary | ICD-10-CM

## 2020-07-11 DIAGNOSIS — I428 Other cardiomyopathies: Secondary | ICD-10-CM | POA: Diagnosis present

## 2020-07-11 DIAGNOSIS — Z803 Family history of malignant neoplasm of breast: Secondary | ICD-10-CM | POA: Diagnosis not present

## 2020-07-11 DIAGNOSIS — Z8249 Family history of ischemic heart disease and other diseases of the circulatory system: Secondary | ICD-10-CM | POA: Diagnosis not present

## 2020-07-11 DIAGNOSIS — E785 Hyperlipidemia, unspecified: Secondary | ICD-10-CM | POA: Diagnosis present

## 2020-07-11 DIAGNOSIS — I442 Atrioventricular block, complete: Secondary | ICD-10-CM | POA: Diagnosis not present

## 2020-07-11 DIAGNOSIS — N289 Disorder of kidney and ureter, unspecified: Secondary | ICD-10-CM | POA: Diagnosis present

## 2020-07-11 DIAGNOSIS — R55 Syncope and collapse: Secondary | ICD-10-CM | POA: Diagnosis present

## 2020-07-11 HISTORY — DX: Atrioventricular block, complete: I44.2

## 2020-07-11 LAB — CBC WITH DIFFERENTIAL/PLATELET
Abs Immature Granulocytes: 0.08 10*3/uL — ABNORMAL HIGH (ref 0.00–0.07)
Basophils Absolute: 0.1 10*3/uL (ref 0.0–0.1)
Basophils Relative: 1 %
Eosinophils Absolute: 0.3 10*3/uL (ref 0.0–0.5)
Eosinophils Relative: 3 %
HCT: 38 % (ref 36.0–46.0)
Hemoglobin: 11.9 g/dL — ABNORMAL LOW (ref 12.0–15.0)
Immature Granulocytes: 1 %
Lymphocytes Relative: 9 %
Lymphs Abs: 1.2 10*3/uL (ref 0.7–4.0)
MCH: 29.5 pg (ref 26.0–34.0)
MCHC: 31.3 g/dL (ref 30.0–36.0)
MCV: 94.3 fL (ref 80.0–100.0)
Monocytes Absolute: 0.6 10*3/uL (ref 0.1–1.0)
Monocytes Relative: 5 %
Neutro Abs: 10.6 10*3/uL — ABNORMAL HIGH (ref 1.7–7.7)
Neutrophils Relative %: 81 %
Platelets: 280 10*3/uL (ref 150–400)
RBC: 4.03 MIL/uL (ref 3.87–5.11)
RDW: 15 % (ref 11.5–15.5)
WBC: 12.9 10*3/uL — ABNORMAL HIGH (ref 4.0–10.5)
nRBC: 0 % (ref 0.0–0.2)

## 2020-07-11 LAB — MAGNESIUM: Magnesium: 1.8 mg/dL (ref 1.7–2.4)

## 2020-07-11 LAB — BASIC METABOLIC PANEL
Anion gap: 10 (ref 5–15)
BUN: 30 mg/dL — ABNORMAL HIGH (ref 8–23)
CO2: 23 mmol/L (ref 22–32)
Calcium: 9 mg/dL (ref 8.9–10.3)
Chloride: 104 mmol/L (ref 98–111)
Creatinine, Ser: 1.15 mg/dL — ABNORMAL HIGH (ref 0.44–1.00)
GFR, Estimated: 46 mL/min — ABNORMAL LOW (ref >60)
Glucose, Bld: 112 mg/dL — ABNORMAL HIGH (ref 70–99)
Potassium: 4 mmol/L (ref 3.5–5.1)
Sodium: 137 mmol/L (ref 135–145)

## 2020-07-11 LAB — RESP PANEL BY RT-PCR (FLU A&B, COVID) ARPGX2
Influenza A by PCR: NEGATIVE
Influenza B by PCR: NEGATIVE
SARS Coronavirus 2 by RT PCR: NEGATIVE

## 2020-07-11 LAB — TSH: TSH: 1.565 u[IU]/mL (ref 0.350–4.500)

## 2020-07-11 LAB — MRSA PCR SCREENING: MRSA by PCR: POSITIVE — AB

## 2020-07-11 MED ORDER — HEPARIN SODIUM (PORCINE) 5000 UNIT/ML IJ SOLN
5000.0000 [IU] | Freq: Three times a day (TID) | INTRAMUSCULAR | Status: DC
  Administered 2020-07-11 – 2020-07-12 (×3): 5000 [IU] via SUBCUTANEOUS
  Filled 2020-07-11 (×3): qty 1

## 2020-07-11 MED ORDER — LEVOTHYROXINE SODIUM 25 MCG PO TABS
25.0000 ug | ORAL_TABLET | Freq: Every day | ORAL | Status: DC
  Administered 2020-07-12 – 2020-07-13 (×2): 25 ug via ORAL
  Filled 2020-07-11 (×2): qty 1

## 2020-07-11 MED ORDER — ISOSORB DINITRATE-HYDRALAZINE 20-37.5 MG PO TABS
1.0000 | ORAL_TABLET | Freq: Three times a day (TID) | ORAL | Status: DC
  Administered 2020-07-11 – 2020-07-12 (×4): 1 via ORAL
  Filled 2020-07-11 (×8): qty 1

## 2020-07-11 MED ORDER — PREDNISONE 10 MG PO TABS
5.0000 mg | ORAL_TABLET | Freq: Every day | ORAL | Status: DC
  Administered 2020-07-12 – 2020-07-13 (×2): 5 mg via ORAL
  Filled 2020-07-11 (×2): qty 1

## 2020-07-11 MED ORDER — MUPIROCIN 2 % EX OINT
1.0000 "application " | TOPICAL_OINTMENT | Freq: Two times a day (BID) | CUTANEOUS | Status: DC
  Administered 2020-07-11 – 2020-07-13 (×4): 1 via NASAL
  Filled 2020-07-11: qty 22

## 2020-07-11 MED ORDER — CHLORHEXIDINE GLUCONATE CLOTH 2 % EX PADS
6.0000 | MEDICATED_PAD | Freq: Every day | CUTANEOUS | Status: DC
  Administered 2020-07-12: 6 via TOPICAL

## 2020-07-11 MED ORDER — EZETIMIBE 10 MG PO TABS
10.0000 mg | ORAL_TABLET | Freq: Every day | ORAL | Status: DC
  Administered 2020-07-12 – 2020-07-13 (×2): 10 mg via ORAL
  Filled 2020-07-11 (×2): qty 1

## 2020-07-11 NOTE — Consult Note (Addendum)
ELECTROPHYSIOLOGY CONSULT NOTE    Patient ID: Pamela Dunn MRN: [id number], DOB/AGE: 1931/09/30 87 y.o.  Admit date: 07/11/2020 Date of Consult: 07/11/2020  Primary Physician: Pcp, No Primary Cardiologist: Dr. Jacinto Halim Electrophysiologist:  New  Referring Provider: Dr. Jacinto Halim  Patient Profile: Pamela Dunn is a 87 y.o. female with a history of RA, LBBB, NICM, HTN, HLD, and severe DJD who is being seen today for the evaluation of syncope at the request of Dr. Jacinto Halim.  HPI:  Pamela Dunn is a 87 y.o. female with medical history as above.   Seen by Dr. Jacinto Halim 06/11/2020 after syncope while seated at home. Sudden onset without prodrome. Watchful waiting was pursued with repeat Echocardiogram and referral for Loop recorder.    Today, she was seated and writing checks when she had sudden loss of consciousness and then woke back up. She has had intermittent dizziness at home. EMS has a rhythm strip that showed a pause and intermittent CHB. She is on Toprol, but this [place] need to be continued long-term for NICM.   Currently, she is asymptomatic at rest. These 2 episodes of syncope have been her only.  She otherwise does her ADLs without difficulty. She is left handed and has a history of left shoulder surgery, so has reduced ROM. Denies DOE or chest pain. No palpitations.    Past Medical History:  Diagnosis Date  . Allergy    seasonal  . Anemia   . Arthritis   . GERD (gastroesophageal reflux disease)   . Hyperlipidemia   . Hypertension      Surgical History:  Past Surgical History:  Procedure Laterality Date  . APPENDECTOMY    . BACK SURGERY    . BOWEL RESECTION N/A 08/24/2019   Procedure: SMALL BOWEL RESECTION;  Surgeon: Abigail Miyamoto, MD;  Location: [facility];  Service: General;  Laterality: N/A;  . LAPAROSCOPY N/A 08/24/2019   Procedure: LAPAROSCOPY DIAGNOSTIC;  Surgeon: Abigail Miyamoto, MD;  Location: [facility];  Service: General;  Laterality: N/A;  . LAPAROTOMY N/A 08/24/2019    Procedure: EXPLORATORY LAPAROTOMY;  Surgeon: Abigail Miyamoto, MD;  Location: [facility];  Service: General;  Laterality: N/A;     (Not in a hospital admission)   Inpatient Medications:  . [START ON 07/12/2020] ezetimibe  10 mg Oral Daily  . heparin  5,000 Units Subcutaneous Q8H  . isosorbide-hydrALAZINE  1 tablet Oral TID  . [START ON 07/12/2020] levothyroxine  25 mcg Oral Daily  . [START ON 07/12/2020] predniSONE  5 mg Oral Daily    Allergies:  Allergies  Allergen Reactions  . Coreg [Carvedilol] Diarrhea    Social History   Socioeconomic History  . Marital status: Widowed    Spouse name: Not on file  . Number of children: 2  . Years of education: Not on file  . Highest education level: Not on file  Occupational History  . Not on file  Tobacco Use  . Smoking status: Never Smoker  . Smokeless tobacco: Never Used  Vaping Use  . Vaping Use: Never used  Substance and Sexual Activity  . Alcohol use: No  . Drug use: No  . Sexual activity: Not on file  Other Topics Concern  . Not on file  Social History Narrative  . Not on file   Social Determinants of Health   Financial Resource Strain: Not on file  Food Insecurity: Not on file  Transportation Needs: Not on file  Physical Activity: Not on file  Stress: Not on  file  Social Connections: Not on file  Intimate Partner Violence: Not on file     Family History  Problem Relation Age of Onset  . Breast cancer Maternal Aunt   . Cancer Mother   . Cancer - Colon Sister   . Heart disease Sister   . Cancer - Colon Brother   . Cancer - Colon Brother      Review of Systems: All other systems reviewed and are otherwise negative except as noted above.  Physical Exam: Vitals:   07/11/20 1242 07/11/20 1244 07/11/20 1315  BP: (!) 199/72  (!) 182/66  Pulse: 65  61  Resp: 18  13  Temp: 98 F (36.7 C)    TempSrc: Oral    SpO2: 94% 96% 99%    GEN- The patient is well appearing, alert and oriented x 3 today.   HEENT:  normocephalic, atraumatic; sclera clear, conjunctiva pink; hearing intact; oropharynx clear; neck supple Lungs- Clear to ausculation bilaterally, normal work of breathing.  No wheezes, rales, rhonchi Heart- Regular rate and rhythm, no murmurs, rubs or gallops GI- soft, non-tender, non-distended, bowel sounds present Extremities- no clubbing, cyanosis, or edema; DP/PT/radial pulses 2+ bilaterally MS- no significant deformity or atrophy Skin- warm and dry, no rash or lesion Psych- euthymic mood, full affect Neuro- strength and sensation are intact  Labs:   Lab Results  Component Value Date   WBC 12.9 (H) 07/11/2020   HGB 11.9 (L) 07/11/2020   HCT 38.0 07/11/2020   MCV 94.3 07/11/2020   PLT 280 07/11/2020    Recent Labs  Lab 07/11/20 1258  NA 137  K 4.0  CL 104  CO2 23  BUN 30*  CREATININE 1.15*  CALCIUM 9.0  GLUCOSE 112*      Radiology/Studies: DG Chest Portable 1 View  Result Date: 07/11/2020 CLINICAL DATA:  Pt arrived via [facility]. EMS reports that pt had near syncope while sitting in recliner at home, but pt denies losing consciousness. Pt denies fall. EMS reports pt had multiple events of pt becoming bradycardic and had a few times while in care that pt c/o feeling near syncope. Pt denies all chest complaints at this time. EXAM: PORTABLE CHEST 1 VIEW COMPARISON:  08/30/2019 FINDINGS: Cardiac silhouette is normal in size. No mediastinal or hilar masses. Linear scar in the left mid lung, stable.  Lungs otherwise clear. No pleural effusion or pneumothorax. Partly imaged left shoulder prosthesis appears well aligned. No gross acute skeletal abnormality. IMPRESSION: No acute cardiopulmonary disease. Electronically Signed   By: Amie Portland M.D.   On: 07/11/2020 13:38    EKG: NSR at 66 bpm, LBBB at 137 bpm (personally reviewed)  Echo 06/16/2019 LVEF 40-45%  TELEMETRY: NSR 60-70s (personally reviewed)  Assessment/Plan: 1.  Syncope, and transient CHB Likely in setting of  interrmitent CHB as noted on EKG Previously saw Dr. Jacinto Halim for same 06/11/20, had plans for observation +/- loop.  Last dose of toprol.  Explained risks, benefits, and alternatives to PPM implantation, including but not limited to bleeding, infection, pneumothorax, pericardial effusion, lead dislodgement, heart attack, stroke, or death.  Pt verbalized understanding and agrees to proceed if decision is to proceed while inpatient given her overall stable rhythm and need for BB washout.   2. NICM Myoview 08/2018 with LVEF 24% and no ischemia other than a "very small sizes apical defect" Echo 06/16/2019 LVEF 40-45% (20% 08/2018) Continue GDMT as tolerated post PPM.   For questions or updates, please contact [facility] Please consult [email or web address]  for contact info under Cardiology/STEMI.  Signed, Graciella Freer, PA-C  07/11/2020 2:37 PM  I have seen and examined this patient with Otilio Saber.  Agree with above, note added to reflect my findings.  On exam, RRR, no murmurs.  Patient presented to the hospital after an episode of syncope.  She has a nonischemic cardiomyopathy.  She is currently on metoprolol.  She took her metoprolol last night.  This is her second episode.  Telemetry strips show complete heart block.  She may need a pacemaker.  Would hold off today as her metoprolol is washing out.  If she has no further pauses, she could potentially be discharged off of metoprolol.  If she needs her metoprolol for her nonischemic cardiomyopathy, pacemaker implant would potentially be indicated.  Awaiting updated echo.  Ciel Chervenak M. Onesti Bonfiglio MD 07/11/2020 3:50 PM

## 2020-07-11 NOTE — ED Triage Notes (Signed)
Pt arrived via [facility]. EMS reports that pt had near syncope while sitting in recliner at home, but pt denies losing consciousness. Pt denies fall. EMS reports pt had multiple events of pt becoming bradycardic and had a few times while in care that pt c/o feeling near syncope. No pallor, diaphoresis per EMS. Pt caox4 and in no obvious distress. Pt denies SOB, N/V, and no obvious neuro deficit.

## 2020-07-11 NOTE — H&P (Signed)
CARDIOLOGY ADMIT NOTE   Patient ID: Pamela Dunn MRN: [id number] DOB/AGE: [birth year] 87 y.o.  Admit date: 07/11/2020 Primary Physician:  Pcp, No  Patient ID: Pamela Dunn, female    DOB: 1931/10/29, 87 y.o.   MRN: [id number]  Chief Complaint  Patient presents with  . Near Syncope   HPI:    Pamela Dunn  is a 87 y.o. Caucasian  female  with with long-standing history of recurrent rheumatoid arthritis, left bundle branch block, nonischemic cardiomyopathy with negative nuclear stress test and EF 20% in July 2020, hypertension, hyperlipidemia, severe DJD and RA with arthritis and using a cane to walk. Nocturnal oximetry in September 2020 revealed severe hypoxemia and she was on nocturnal oxygen supplementation.    I seen her on 06/11/2020 when she was having recurrent episodes of syncope.  In view of cardiomyopathy and underlying left bundle branch block and advanced age, both etiology for NSVT and heart block was in question, she was recommended a loop recorder implantation for evaluation of the same.  Otherwise she has been doing well, without leg edema, worsening dyspnea, no PND or orthopnea, no chest pain or palpitations, no recent hospitalization.  This afternoon while she was sitting at home and trying to write a check, she suddenly had loss of consciousness.  EMS was activated and upon arrival, was found to have bradycardia and EKG revealed complete heart block with no junctional or ventricular escape.  She was brought to the emergency room where she essentially was asymptomatic upon arrival.  Presently her son is present at the bedside, states that she is doing well and back to baseline.   Past Medical History:  Diagnosis Date  . Allergy    seasonal  . Anemia   . Arthritis   . GERD (gastroesophageal reflux disease)   . Hyperlipidemia   . Hypertension    Past Surgical History:  Procedure Laterality Date  . APPENDECTOMY    . BACK SURGERY    . BOWEL RESECTION N/A 08/24/2019    Procedure: SMALL BOWEL RESECTION;  Surgeon: Abigail Miyamoto, MD;  Location: [facility];  Service: General;  Laterality: N/A;  . LAPAROSCOPY N/A 08/24/2019   Procedure: LAPAROSCOPY DIAGNOSTIC;  Surgeon: Abigail Miyamoto, MD;  Location: [facility];  Service: General;  Laterality: N/A;  . LAPAROTOMY N/A 08/24/2019   Procedure: EXPLORATORY LAPAROTOMY;  Surgeon: Abigail Miyamoto, MD;  Location: [facility];  Service: General;  Laterality: N/A;   Social History   Socioeconomic History  . Marital status: Widowed    Spouse name: Not on file  . Number of children: 2  . Years of education: Not on file  . Highest education level: Not on file  Occupational History  . Not on file  Tobacco Use  . Smoking status: Never Smoker  . Smokeless tobacco: Never Used  Vaping Use  . Vaping Use: Never used  Substance and Sexual Activity  . Alcohol use: No  . Drug use: No  . Sexual activity: Not on file  Other Topics Concern  . Not on file  Social History Narrative  . Not on file   Social Determinants of Health   Financial Resource Strain: Not on file  Food Insecurity: Not on file  Transportation Needs: Not on file  Physical Activity: Not on file  Stress: Not on file  Social Connections: Not on file  Intimate Partner Violence: Not on file   Family History  Problem Relation Age of Onset  . Breast cancer Maternal Aunt   .  Cancer Mother   . Cancer - Colon Sister   . Heart disease Sister   . Cancer - Colon Brother   . Cancer - Colon Brother     ROS  Review of Systems  Constitutional: Negative for malaise/fatigue.  Cardiovascular: Positive for dyspnea on exertion (stable) and syncope. Negative for chest pain and leg swelling.  Musculoskeletal: Positive for arthritis and joint pain.  Gastrointestinal: Negative for melena.  All other systems reviewed and are negative.  Objective   Vitals with BMI 07/11/2020 07/11/2020 06/11/2020  Height - - 5\' 0"   Weight - - 142 lbs  BMI - - 27.73  Systolic 182 199   Diastolic 66 72 73  Pulse 61 65 63      Physical Exam Constitutional:      General: She is not in acute distress.    Appearance: She is well-developed. She is obese.     Comments: Short stature  HENT:     Mouth/Throat:     Mouth: Mucous membranes are moist.  Eyes:     Extraocular Movements: Extraocular movements intact.  Cardiovascular:     Rate and Rhythm: Normal rate and regular rhythm.     Pulses: Intact distal pulses.     Heart sounds: No murmur heard. No gallop.      Comments: S1 is normal, S2 is paradoxically split. No JVD. No pedal edema. Pulmonary:     Effort: Pulmonary effort is normal.     Breath sounds: Normal breath sounds.  Abdominal:     General: Bowel sounds are normal.     Palpations: Abdomen is soft.  Musculoskeletal:        General: Deformity (burnt out RA in hands) present. Normal range of motion.     Cervical back: Normal range of motion and neck supple.  Skin:    General: Skin is warm.     Capillary Refill: Capillary refill takes less than 2 seconds.  Neurological:     General: No focal deficit present.     Mental Status: She is oriented to person, place, and time.    Laboratory examination:    Recent Labs    08/31/19 0445 09/01/19 0422 09/02/19 0338  NA 138 136 136  K 3.8 3.6 3.7  CL 103 103 103  CO2 26 26 22   GLUCOSE 116* 91 106*  BUN 23 29* 29*  CREATININE 1.14* 1.49* 1.23*  CALCIUM 7.3* 7.4* 7.9*  GFRNONAA 44* 32* 40*  GFRAA 51* 37* 46*   CrCl cannot be calculated (Patient's most recent lab result is older than the maximum 21 days allowed.).  CMP Latest Ref Rng & Units 09/02/2019 09/01/2019 08/31/2019  Glucose 70 - 99 mg/dL 09/03/2019) 91 09/02/2019)  BUN 8 - 23 mg/dL 269(S) 854(O) 23  Creatinine 0.44 - 1.00 mg/dL 27(O) 35(K) 0.93(G)  Sodium 135 - 145 mmol/L 136 136 138  Potassium 3.5 - 5.1 mmol/L 3.7 3.6 3.8  Chloride 98 - 111 mmol/L 103 103 103  CO2 22 - 32 mmol/L 22 26 26   Calcium 8.9 - 10.3 mg/dL 7.9(L) 7.4(L) 7.3(L)  Total  Protein 6.5 - 8.1 g/dL - - 5.1(L)  Total Bilirubin 0.3 - 1.2 mg/dL - - 0.6  Alkaline Phos 38 - 126 U/L - - 96  AST 15 - 41 U/L - - 22  ALT 0 - 44 U/L - - 18   CBC Latest Ref Rng & Units 09/02/2019 09/01/2019 08/31/2019  WBC 4.0 - 10.5 K/uL 17.6(H) 21.4(H) 13.2(H)  Hemoglobin 12.0 -  15.0 g/dL 9.0(L) 8.6(L) 10.2(L)  Hematocrit 36.0 - 46.0 % 28.2(L) 26.7(L) 31.6(L)  Platelets 150 - 400 K/uL 360 331 375   Lipid Panel  No results found for: CHOL, TRIG, HDL, CHOLHDL, VLDL, LDLCALC, LDLDIRECT HEMOGLOBIN A1C No results found for: HGBA1C, MPG TSH No results for input(s): TSH in the last 8760 hours. BNP (last 3 results) Recent Labs    08/26/19 0538 08/29/19 0359  BNP 721.4* 103.3*    Medications and allergies   Allergies  Allergen Reactions  . Coreg [Carvedilol] Diarrhea     Current Outpatient Medications  Medication Instructions  . Aspirin-Caffeine 500-32.5 MG TABS 1 tablet, Oral, Daily PRN  . BIDIL 20-37.5 MG tablet TAKE 1 TABLET BY MOUTH THREE TIMES A DAY  . cholecalciferol (VITAMIN D) 1,000 Units, Oral, Daily  . CoQ10 200 mg, Daily  . ezetimibe (ZETIA) 10 mg, Oral, Daily  . folic acid (FOLVITE) 1 mg, Oral, Daily  . levothyroxine (SYNTHROID) 25 mcg, Oral, Daily  . metoprolol succinate (TOPROL-XL) 50 mg, Oral, Daily, Take with or immediately following a meal.  . predniSONE (DELTASONE) 5 mg, Oral, Daily   Scheduled Meds: . [START ON 07/12/2020] ezetimibe  10 mg Oral Daily  . heparin  5,000 Units Subcutaneous Q8H  . isosorbide-hydrALAZINE  1 tablet Oral TID  . [START ON 07/12/2020] levothyroxine  25 mcg Oral Daily  . [START ON 07/12/2020] predniSONE  5 mg Oral Daily   Continuous Infusions: PRN Meds:.   Radiology:  DG Chest Portable 1 View  Result Date: 07/11/2020 CLINICAL DATA:  Pt arrived via [facility]. EMS reports that pt had near syncope while sitting in recliner at home, but pt denies losing consciousness. Pt denies fall. EMS reports pt had multiple events of pt becoming  bradycardic and had a few times while in care that pt c/o feeling near syncope. Pt denies all chest complaints at this time. EXAM: PORTABLE CHEST 1 VIEW COMPARISON:  08/30/2019 FINDINGS: Cardiac silhouette is normal in size. No mediastinal or hilar masses. Linear scar in the left mid lung, stable.  Lungs otherwise clear. No pleural effusion or pneumothorax. Partly imaged left shoulder prosthesis appears well aligned. No gross acute skeletal abnormality. IMPRESSION: No acute cardiopulmonary disease. Electronically Signed   By: Amie Portland M.D.   On: 07/11/2020 13:38    Cardiac Studies:   Lexiscan Myoview Stress Test06/29/2020: Resting EKG demonstrates normal sinus rhythm. Left bundle branch block. Stress EKG is non-diagnostic as it's a pharmacologic stress test. Nuclear images reveal left ventricle to be mildly dilated both in rest and stress images with end-diastolic volume of 138 mL. The perfusion imaging study demonstrates a moderate sized inferior and inferolateral decreased uptake suggestive of soft tissue attenuation. In addition there is a very small sized apical septal defect suggestive of ischemia. The left ventricle systolic function calculated by QGS was 24% with global hypokinesis. This represents a high risk study in view of low EF, clinical correlation recommended. Findings may represent non ischemic dilated cardiomyopathy.  Nocturnal oximetry 12/02/2018: SPO2 <88% 23 minutes, <89% 37 minutes, highest SPO2 99%, lowest SPO2 69%.  Oxygen desaturation events 3% (316 events).  Patient prescribed nocturnal oxygen supplementation. Now off O2 due to resolution of fatigue.  Echocardiogram 06/16/2019:  Left ventricle cavity is normal in size. Mild concentric hypertrophy of the left ventricle. Abnormal septal wall motion due to left bundle branch  block. Mildly depressed LV systolic function with visual EF 40-45%. Doppler evidence of grade I (impaired) diastolic dysfunction, normal LAP.   Trileaflet  aortic valve. Trace aortic regurgitation.  Moderate (Grade II) mitral regurgitation.  Moderate tricuspid regurgitation. Estimated pulmonary artery systolic pressure is 30 mmHg.  No significant change compared to previous study on 11/30/2018. Compared to previous study on 08/11/2018, LVEF is improved from 20%. Mitral regurgitation is less severe.   EKG 07/11/2020: Normal sinus rhythm at rate of 66 beats minute, leftward enlargement, left bundle branch block.  No further analysis.  No significant change from 06/11/2020.   3-lead EKG 07/11/2020 by EMS: Complete heart block with no ventricular escape.  Assessment   1.  Cardiac syncope related to complete heart block 2.  Nonischemic cardiomyopathy 3.  Chronic systolic heart failure 4.  Chronic left bundle branch block 5.  Primary hypertension  Recommendations:   Patient presentation is fairly classic for AV nodal disease in view of advanced age.  She is on moderate dose of beta-blocker therapy with metoprolol however this is needed in view of her cardiomyopathy, previously prior to guideline directed medical therapy, EF is 20% which has improved to 40 to 45% by echocardiogram in April 2021.  We will consult EP for evaluation for possible pacemaker implantation.  Given her LVEF and advanced age, do not think she needs ICD implantation.  Otherwise no clinical evidence of heart failure, blood pressure is well controlled, but elevated today due to missed doses of vital since admission.  We will continue to evaluate this.  In spite of advanced age, she continues to live independently.  She has burnt out rheumatoid arthritis.   Yates Decamp, MD, [facility] 07/11/2020, 9:06 PM Office: [phone] Pager: [phone]

## 2020-07-11 NOTE — ED Provider Notes (Signed)
[facility] EMERGENCY DEPARTMENT Provider Note   CSN: [id number] Arrival date & time: 07/11/20  1230     History Chief Complaint  Patient presents with  . Near Syncope    Pamela Dunn is a 87 y.o. female.  HPI 87 year old female presents via EMS with syncope.  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  She had no preceding symptoms.  Similar to January which led to a cardiology consult.  She has not yet received the loop recorder.  Since then she has felt intermittent dizziness, mostly while at home.  EMS brought the patient in and noted a rhythm strip that they printed that showed some atrial beats with no ventricular conduction.  Patient has had no preceding illness.  Past Medical History:  Diagnosis Date  . Allergy    seasonal  . Anemia   . Arthritis   . GERD (gastroesophageal reflux disease)   . Hyperlipidemia   . Hypertension     Patient Active Problem List   Diagnosis Date Noted  . Heart block AV complete (HCC) 07/11/2020  . Pressure injury of skin 08/27/2019  . Hypothyroidism 08/23/2019  . Chronic combined systolic and diastolic congestive heart failure (HCC) 08/23/2019  . SBO (small bowel obstruction) (HCC) 08/17/2019  . Multiple pulmonary nodules 06/19/2017  . Metatarsalgia of left foot 07/13/2014  . Hx of adenomatous colonic polyps 09/05/2013  . Spinal stenosis of lumbar region 09/05/2013  . Lumbar disc disease 09/05/2013  . Hyperlipidemia 10/23/2009  . GERD 10/23/2009  . Rheumatoid arthritis (HCC) 10/23/2009  . ABDOMINAL PAIN, UNSPECIFIED SITE 10/23/2009  . ANEMIA, HX OF 10/23/2009  . PEPTIC ULCER DISEASE, HX OF 10/23/2009    Past Surgical History:  Procedure Laterality Date  . APPENDECTOMY    . BACK SURGERY    . BOWEL RESECTION N/A 08/24/2019   Procedure: SMALL BOWEL RESECTION;  Surgeon: Abigail Miyamoto, MD;  Location: [facility];  Service: General;  Laterality: N/A;  . LAPAROSCOPY N/A 08/24/2019    Procedure: LAPAROSCOPY DIAGNOSTIC;  Surgeon: Abigail Miyamoto, MD;  Location: [facility];  Service: General;  Laterality: N/A;  . LAPAROTOMY N/A 08/24/2019   Procedure: EXPLORATORY LAPAROTOMY;  Surgeon: Abigail Miyamoto, MD;  Location: [facility];  Service: General;  Laterality: N/A;     OB History   No obstetric history on file.     Family History  Problem Relation Age of Onset  . Breast cancer Maternal Aunt   . Cancer Mother   . Cancer - Colon Sister   . Heart disease Sister   . Cancer - Colon Brother   . Cancer - Colon Brother     Social History   Tobacco Use  . Smoking status: Never Smoker  . Smokeless tobacco: Never Used  Vaping Use  . Vaping Use: Never used  Substance Use Topics  . Alcohol use: No  . Drug use: No    Home Medications Prior to Admission medications   Medication Sig Start Date End Date Taking? Authorizing Provider  Aspirin-Caffeine 500-32.5 MG TABS Take 1 tablet by mouth daily as needed (pain).     [provider]  BIDIL 20-37.5 MG tablet TAKE 1 TABLET BY MOUTH THREE TIMES A DAY 05/28/20   Yates Decamp, MD  cholecalciferol (VITAMIN D) 1000 UNITS tablet Take 1,000 Units by mouth daily.     [provider]  Coenzyme Q10 (COQ10) 200 MG CAPS Take 200 mg by mouth daily.    [provider]  ezetimibe (ZETIA) 10 MG tablet Take 10 mg by mouth daily.     [provider]  folic acid (FOLVITE) 1 MG tablet Take 1 mg by mouth daily.  05/03/18   [provider]  levothyroxine (SYNTHROID) 25 MCG tablet Take 25 mcg by mouth daily.     [provider]  metoprolol succinate (TOPROL-XL) 50 MG 24 hr tablet TAKE 1 TABLET (50 MG TOTAL) BY MOUTH DAILY. TAKE WITH OR IMMEDIATELY FOLLOWING A MEAL. 03/01/20 05/30/20  Yates Decamp, MD  predniSONE (DELTASONE) 5 MG tablet Take 5 mg by mouth daily. 11/03/18   [provider]    Allergies    Coreg [carvedilol]  Review of Systems   Review of Systems  Cardiovascular: Negative for  chest pain.  Neurological: Positive for syncope. Negative for dizziness, light-headedness and headaches.  All other systems reviewed and are negative.   Physical Exam Updated Vital Signs BP (!) 182/66   Pulse 61   Temp 98 F (36.7 C) (Oral)   Resp 13   SpO2 99%   Physical Exam Vitals and nursing note reviewed.  Constitutional:      General: She is not in acute distress.    Appearance: She is well-developed. She is not ill-appearing or diaphoretic.  HENT:     Head: Normocephalic and atraumatic.     Right Ear: External ear normal.     Left Ear: External ear normal.     Nose: Nose normal.  Eyes:     General:        Right eye: No discharge.        Left eye: No discharge.  Cardiovascular:     Rate and Rhythm: Normal rate and regular rhythm.     Heart sounds: Normal heart sounds.  Pulmonary:     Effort: Pulmonary effort is normal.     Breath sounds: Normal breath sounds.  Abdominal:     Palpations: Abdomen is soft.     Tenderness: There is no abdominal tenderness.  Skin:    General: Skin is warm and dry.  Neurological:     Mental Status: She is alert.  Psychiatric:        Mood and Affect: Mood is not anxious.        ED Results / Procedures / Treatments   Labs (all labs ordered are listed, but only abnormal results are displayed) Labs Reviewed  BASIC METABOLIC PANEL - Abnormal; Notable for the following components:      Result Value   Glucose, Bld 112 (*)    BUN 30 (*)    Creatinine, Ser 1.15 (*)    GFR, Estimated 46 (*)    All other components within normal limits  CBC WITH DIFFERENTIAL/PLATELET - Abnormal; Notable for the following components:   WBC 12.9 (*)    Hemoglobin 11.9 (*)    Neutro Abs 10.6 (*)    Abs Immature Granulocytes 0.08 (*)    All other components within normal limits  RESP PANEL BY RT-PCR (FLU A&B, COVID) ARPGX2  MAGNESIUM  TSH    EKG EKG Interpretation  Date/Time:  Wednesday Jul 11 2020 12:40:30 EDT Ventricular Rate:  66 PR  Interval:  189 QRS Duration: 137 QT Interval:  446 QTC Calculation: 468 R Axis:   -49 Text Interpretation: Sinus rhythm Probable left atrial enlargement Left bundle branch block Confirmed by Pricilla Loveless [phone]) on 07/11/2020 12:43:05 PM   Radiology DG Chest Portable 1 View  Result Date: 07/11/2020 CLINICAL DATA:  Pt  arrived via [facility]. EMS reports that pt had near syncope while sitting in recliner at home, but pt denies losing consciousness. Pt denies fall. EMS reports pt had multiple events of pt becoming bradycardic and had a few times while in care that pt c/o feeling near syncope. Pt denies all chest complaints at this time. EXAM: PORTABLE CHEST 1 VIEW COMPARISON:  08/30/2019 FINDINGS: Cardiac silhouette is normal in size. No mediastinal or hilar masses. Linear scar in the left mid lung, stable.  Lungs otherwise clear. No pleural effusion or pneumothorax. Partly imaged left shoulder prosthesis appears well aligned. No gross acute skeletal abnormality. IMPRESSION: No acute cardiopulmonary disease. Electronically Signed   By: Amie Portland M.D.   On: 07/11/2020 13:38    Procedures Procedures   Medications Ordered in ED Medications  predniSONE (DELTASONE) tablet 5 mg (has no administration in time range)  levothyroxine (SYNTHROID) tablet 25 mcg (has no administration in time range)  ezetimibe (ZETIA) tablet 10 mg (has no administration in time range)  isosorbide-hydrALAZINE (BIDIL) 20-37.5 MG per tablet 1 tablet (has no administration in time range)  heparin injection 5,000 Units (has no administration in time range)    ED Course  I have reviewed the triage vital signs and the nursing notes.  Pertinent labs & imaging results that were available during my care of the patient were reviewed by me and considered in my medical decision making (see chart for details).    MDM Rules/Calculators/A&P                          Patient is stable here with no obvious AV blocks on initial ECG.   However the rhythm strip from EMS shows that she did have a complete heart block with no ventricular contractions.  Given this, I discussed with her cardiologist, Dr. Jacinto Halim, who will admit and get EP involved. Final Clinical Impression(s) / ED Diagnoses Final diagnoses:  Complete heart block [facility])    Rx / DC Orders ED Discharge Orders    None       Pricilla Loveless, MD 07/11/20 1450

## 2020-07-11 NOTE — Plan of Care (Signed)
  Problem: Education: Goal: Knowledge of cardiac device and self-care will improve Outcome: Progressing Goal: Ability to safely manage health related needs after discharge will improve Outcome: Progressing Goal: Individualized Educational Video(s) Outcome: Progressing   Problem: Cardiac: Goal: Ability to achieve and maintain adequate cardiopulmonary perfusion will improve Outcome: Progressing   Problem: Education: Goal: Knowledge of General Education information will improve Description: Including pain rating scale, medication(s)/side effects and non-pharmacologic comfort measures Outcome: Progressing   Problem: Health Behavior/Discharge Planning: Goal: Ability to manage health-related needs will improve Outcome: Progressing   Problem: Coping: Goal: Level of anxiety will decrease Outcome: Progressing   Problem: Pain Managment: Goal: General experience of comfort will improve Outcome: Progressing   Problem: Safety: Goal: Ability to remain free from injury will improve Outcome: Progressing

## 2020-07-12 ENCOUNTER — Encounter: Payer: Self-pay | Admitting: Cardiology

## 2020-07-12 ENCOUNTER — Encounter (HOSPITAL_COMMUNITY)
Admission: EM | Disposition: A | Discharge status: Home Health | Payer: Self-pay | Source: Home / Self Care | Attending: Cardiology

## 2020-07-12 ENCOUNTER — Inpatient Hospital Stay (HOSPITAL_COMMUNITY): Payer: Medicare PPO

## 2020-07-12 DIAGNOSIS — Z7189 Other specified counseling: Secondary | ICD-10-CM | POA: Insufficient documentation

## 2020-07-12 DIAGNOSIS — Z45018 Encounter for adjustment and management of other part of cardiac pacemaker: Secondary | ICD-10-CM

## 2020-07-12 DIAGNOSIS — I442 Atrioventricular block, complete: Secondary | ICD-10-CM

## 2020-07-12 DIAGNOSIS — Z950 Presence of cardiac pacemaker: Secondary | ICD-10-CM

## 2020-07-12 HISTORY — DX: Encounter for adjustment and management of other part of cardiac pacemaker: Z45.018

## 2020-07-12 HISTORY — DX: Presence of cardiac pacemaker: Z95.0

## 2020-07-12 HISTORY — PX: PACEMAKER IMPLANT: EP1218

## 2020-07-12 LAB — ECHOCARDIOGRAM LIMITED
AR max vel: 1.42 cm2
AV Area VTI: 1.56 cm2
AV Area mean vel: 1.5 cm2
AV Mean grad: 5 mmHg
AV Peak grad: 9.2 mmHg
Ao pk vel: 1.52 m/s
Area-P 1/2: 3.72 cm2
Calc EF: 43.2 %
Height: 60 in
S' Lateral: 2.8 cm
Single Plane A2C EF: 44.5 %
Single Plane A4C EF: 45 %
Weight: 2194.02 oz

## 2020-07-12 SURGERY — PACEMAKER IMPLANT

## 2020-07-12 MED ORDER — SODIUM CHLORIDE 0.9 % IV SOLN: INTRAVENOUS | Status: DC

## 2020-07-12 MED ORDER — CEFAZOLIN SODIUM-DEXTROSE 1-4 GM/50ML-% IV SOLN
1.0000 g | Freq: Four times a day (QID) | INTRAVENOUS | Status: AC
Start: 2020-07-12 — End: 2020-07-13
  Administered 2020-07-12 – 2020-07-13 (×3): 1 g via INTRAVENOUS
  Filled 2020-07-12 (×3): qty 50

## 2020-07-12 MED ORDER — IOHEXOL 350 MG/ML SOLN
INTRAVENOUS | Status: DC | PRN
  Administered 2020-07-12: 15 mL

## 2020-07-12 MED ORDER — HEPARIN (PORCINE) IN NACL 1000-0.9 UT/500ML-% IV SOLN
INTRAVENOUS | Status: DC | PRN
  Administered 2020-07-12: 500 mL

## 2020-07-12 MED ORDER — SODIUM CHLORIDE 0.9 % IV SOLN: 250.0000 mL | INTRAVENOUS | Status: DC | PRN

## 2020-07-12 MED ORDER — SODIUM CHLORIDE 0.9% FLUSH
3.0000 mL | INTRAVENOUS | Status: DC | PRN
Start: 2020-07-12 — End: 2020-07-13
  Administered 2020-07-12: 3 mL via INTRAVENOUS

## 2020-07-12 MED ORDER — CEFAZOLIN SODIUM-DEXTROSE 2-4 GM/100ML-% IV SOLN
2.0000 g | INTRAVENOUS | Status: AC
  Administered 2020-07-12: 2 g via INTRAVENOUS
  Filled 2020-07-12: qty 100

## 2020-07-12 MED ORDER — METOPROLOL SUCCINATE ER 50 MG PO TB24
50.0000 mg | ORAL_TABLET | Freq: Every day | ORAL | Status: DC
  Filled 2020-07-12: qty 1

## 2020-07-12 MED ORDER — LIDOCAINE HCL (PF) 1 % IJ SOLN
INTRAMUSCULAR | Status: AC
  Filled 2020-07-12: qty 30

## 2020-07-12 MED ORDER — SODIUM CHLORIDE 0.9% FLUSH
3.0000 mL | Freq: Two times a day (BID) | INTRAVENOUS | Status: DC
  Administered 2020-07-13: 3 mL via INTRAVENOUS

## 2020-07-12 MED ORDER — DICLOFENAC SODIUM 1 % EX GEL
2.0000 g | Freq: Four times a day (QID) | CUTANEOUS | Status: DC | PRN
  Administered 2020-07-12: 2 g via TOPICAL
  Filled 2020-07-12: qty 100

## 2020-07-12 MED ORDER — ACETAMINOPHEN 325 MG PO TABS: 325.0000 mg | ORAL_TABLET | ORAL | Status: DC | PRN

## 2020-07-12 MED ORDER — CEFAZOLIN SODIUM-DEXTROSE 2-4 GM/100ML-% IV SOLN
INTRAVENOUS | Status: AC
  Filled 2020-07-12: qty 100

## 2020-07-12 MED ORDER — LIDOCAINE HCL (PF) 1 % IJ SOLN
INTRAMUSCULAR | Status: DC | PRN
Start: 2020-07-12 — End: 2020-07-12
  Administered 2020-07-12: 45 mL

## 2020-07-12 MED ORDER — SODIUM CHLORIDE 0.9 % IV SOLN
INTRAVENOUS | Status: AC
  Filled 2020-07-12: qty 2

## 2020-07-12 MED ORDER — SODIUM CHLORIDE 0.9 % IV SOLN
80.0000 mg | INTRAVENOUS | Status: AC
  Administered 2020-07-12: 80 mg
  Filled 2020-07-12: qty 2

## 2020-07-12 MED ORDER — ONDANSETRON HCL 4 MG/2ML IJ SOLN: 4.0000 mg | Freq: Four times a day (QID) | INTRAMUSCULAR | Status: DC | PRN

## 2020-07-12 MED ORDER — HEPARIN (PORCINE) IN NACL 1000-0.9 UT/500ML-% IV SOLN
INTRAVENOUS | Status: AC
  Filled 2020-07-12: qty 500

## 2020-07-12 MED ORDER — HYDROCODONE-ACETAMINOPHEN 5-325 MG PO TABS
1.0000 | ORAL_TABLET | ORAL | Status: DC | PRN
  Administered 2020-07-12: 1 via ORAL
  Administered 2020-07-13: 2 via ORAL
  Filled 2020-07-12: qty 1
  Filled 2020-07-12: qty 2

## 2020-07-12 MED ORDER — CHLORHEXIDINE GLUCONATE 4 % EX LIQD
60.0000 mL | Freq: Once | CUTANEOUS | Status: AC
  Administered 2020-07-12: 4 via TOPICAL
  Filled 2020-07-12: qty 15

## 2020-07-12 SURGICAL SUPPLY — 8 items
CABLE SURGICAL S-101-97-12 (CABLE) ×2 IMPLANT
KIT MICROPUNCTURE NIT STIFF (SHEATH) ×1 IMPLANT
LEAD TENDRIL MRI 46CM LPA1200M (Lead) ×1 IMPLANT
LEAD TENDRIL MRI 58CM LPA1200M (Lead) ×1 IMPLANT
PACEMAKER ASSURITY DR-RF (Pacemaker) ×1 IMPLANT
PAD PRO RADIOLUCENT 2001M-C (PAD) ×2 IMPLANT
SHEATH 8FR PRELUDE SNAP 13 (SHEATH) ×2 IMPLANT
TRAY PACEMAKER INSERTION (PACKS) ×2 IMPLANT

## 2020-07-12 NOTE — Discharge Instructions (Signed)
After Your Pacemaker   ACTIVITY . Do not lift your arm above shoulder height for 1 week after your procedure. After 7 days, you may progress as below.  . You should remove your sling 24 hours after your procedure, unless otherwise instructed by your provider.     Thursday Jul 19, 2020  Friday Jul 20, 2020 Saturday Jul 21, 2020 Sunday Jul 22, 2020   . Do not lift, push, pull, or carry anything over 10 pounds with the affected arm until 6 weeks (Thursday August 23, 2020 ) after your procedure.   . You may drive AFTER your wound check, unless you have been told otherwise by your provider.   . Ask your healthcare provider when you can go back to work   INCISION/Dressing . If you are on a blood thinner such as Coumadin, Xarelto, Eliquis, Plavix, or Pradaxa please confirm with your provider when this should be resumed.   . If large square, outer bandage is left in place, this can be removed after 24 hours from your procedure. Do not remove steri-strips or glue as below.   . Monitor your Pacemaker site for redness, swelling, and drainage. Call the device clinic at [phone] if you experience these symptoms or fever/chills.  . If your incision is sealed with Steri-strips or staples, you may shower 10 days after your procedure or when told by your provider. Do not remove the steri-strips or let the shower hit directly on your site. You may wash around your site with soap and water.    [name] Kitchen Avoid lotions, ointments, or perfumes over your incision until it is well-healed.  . You may use a hot tub or a pool AFTER your wound check appointment if the incision is completely closed.  [name] Kitchen PAcemaker Alerts:  Some alerts are vibratory and others beep. These are NOT emergencies. Please call our office to let us know. If this occurs at night or on weekends, it can wait until the next business day. Send a remote transmission.  . If your device is capable of reading fluid status (for heart failure), you will be  offered monthly monitoring to review this with you.   DEVICE MANAGEMENT . Remote monitoring is used to monitor your pacemaker from home. This monitoring is scheduled every 91 days by our office. It allows [place] to keep an eye on the functioning of your device to ensure it is working properly. You will routinely see your Electrophysiologist annually (more often if necessary).   . You should receive your ID card for your new device in 4-8 weeks. Keep this card with you at all times once received. Consider wearing a medical alert bracelet or necklace.  . Your Pacemaker may be MRI compatible. This will be discussed at your next office visit/wound check.  You should avoid contact with strong electric or magnetic fields.    Do not use amateur (ham) radio equipment or electric (arc) welding torches. MP3 player headphones with magnets should not be used. Some devices are safe to use if held at least 12 inches (30 cm) from your Pacemaker. These include power tools, lawn mowers, and speakers. If you are unsure if something is safe to use, ask your health care provider.   When using your cell phone, hold it to the ear that is on the opposite side from the Pacemaker. Do not leave your cell phone in a pocket over the Pacemaker.   You may safely use electric blankets, heating pads, computers, and microwave ovens.  Call the office right away if:  You have chest pain.  You feel more short of breath than you have felt before.  You feel more light-headed than you have felt before.  Your incision starts to open up.  This information is not intended to replace advice given to you by your health care provider. Make sure you discuss any questions you have with your health care provider.

## 2020-07-12 NOTE — Interval H&P Note (Signed)
History and Physical Interval Note:  07/12/2020 2:16 PM  Pamela Dunn  has presented today for surgery, with the diagnosis of complete heart block.  The various methods of treatment have been discussed with the patient and family. After consideration of risks, benefits and other options for treatment, the patient has consented to  Procedure(s): PACEMAKER IMPLANT (N/A) as a surgical intervention.  The patient's history has been reviewed, patient examined, no change in status, stable for surgery.  I have reviewed the patient's chart and labs.  Questions were answered to the patient's satisfaction.     Hillis Range

## 2020-07-12 NOTE — Plan of Care (Signed)

## 2020-07-12 NOTE — Plan of Care (Signed)
  Problem: Education: Goal: Ability to safely manage health related needs after discharge will improve Outcome: Progressing   Problem: Cardiac: Goal: Ability to achieve and maintain adequate cardiopulmonary perfusion will improve Outcome: Progressing   Problem: Education: Goal: Knowledge of General Education information will improve Description: Including pain rating scale, medication(s)/side effects and non-pharmacologic comfort measures Outcome: Progressing   Problem: Health Behavior/Discharge Planning: Goal: Ability to manage health-related needs will improve Outcome: Progressing   Problem: Coping: Goal: Level of anxiety will decrease Outcome: Progressing

## 2020-07-12 NOTE — H&P (View-Only) (Signed)
Electrophysiology Rounding Note  Patient Name: Pamela Dunn Date of Encounter: 07/12/2020  Primary Cardiologist: Dr. Jacinto Halim Electrophysiologist: New to Dr. Johney Frame   Subjective   The patient is doing well today.  At this time, the patient denies chest pain, shortness of breath, or any new concerns.  No further HB noted on tele  Inpatient Medications    Scheduled Meds: . Chlorhexidine Gluconate Cloth  6 each Topical Q0600  . ezetimibe  10 mg Oral Daily  . isosorbide-hydrALAZINE  1 tablet Oral TID  . levothyroxine  25 mcg Oral Daily  . mupirocin ointment  1 application Nasal BID  . predniSONE  5 mg Oral Daily   Continuous Infusions:  PRN Meds:    Vital Signs    Vitals:   07/11/20 1920 07/11/20 2300 07/12/20 0300 07/12/20 0727  BP: (!) 137/56 124/62 (!) 137/57 120/63  Pulse: 60 63 61 64  Resp: 15 18 17 17   Temp: 98.3 F (36.8 C) 98.1 F (36.7 C) 98.3 F (36.8 C) 98.4 F (36.9 C)  TempSrc: Oral Oral Oral Oral  SpO2: 94% 95% 91% 96%  Weight:      Height:        Intake/Output Summary (Last 24 hours) at 07/12/2020 0855 Last data filed at 07/11/2020 2300 Gross per 24 hour  Intake 360 ml  Output 550 ml  Net -190 ml   Filed Weights   07/11/20 1713  Weight: 62.2 kg    Physical Exam    GEN- The patient is well appearing, alert and oriented x 3 today.   Head- normocephalic, atraumatic Eyes-  Sclera clear, conjunctiva pink Ears- hearing intact Oropharynx- clear Neck- supple Lungs- Clear to ausculation bilaterally, normal work of breathing Heart- Regular rate and rhythm, no murmurs, rubs or gallops GI- soft, NT, ND, + BS Extremities- no clubbing or cyanosis. No edema Skin- no rash or lesion Psych- euthymic mood, full affect Neuro- strength and sensation are intact  Labs    CBC Recent Labs    07/11/20 1258  WBC 12.9*  NEUTROABS 10.6*  HGB 11.9*  HCT 38.0  MCV 94.3  PLT 280   Basic Metabolic Panel Recent Labs    09/10/20 1258  NA 137  K 4.0   CL 104  CO2 23  GLUCOSE 112*  BUN 30*  CREATININE 1.15*  CALCIUM 9.0  MG 1.8   Liver Function Tests No results for input(s): AST, ALT, ALKPHOS, BILITOT, PROT, ALBUMIN in the last 72 hours. No results for input(s): LIPASE, AMYLASE in the last 72 hours. Cardiac Enzymes No results for input(s): CKTOTAL, CKMB, CKMBINDEX, TROPONINI in the last 72 hours.   Telemetry    Sinus brady/NSR 50-60s (personally reviewed)  Radiology    DG Chest Portable 1 View  Result Date: 07/11/2020 CLINICAL DATA:  Pt arrived via [facility]. EMS reports that pt had near syncope while sitting in recliner at home, but pt denies losing consciousness. Pt denies fall. EMS reports pt had multiple events of pt becoming bradycardic and had a few times while in care that pt c/o feeling near syncope. Pt denies all chest complaints at this time. EXAM: PORTABLE CHEST 1 VIEW COMPARISON:  08/30/2019 FINDINGS: Cardiac silhouette is normal in size. No mediastinal or hilar masses. Linear scar in the left mid lung, stable.  Lungs otherwise clear. No pleural effusion or pneumothorax. Partly imaged left shoulder prosthesis appears well aligned. No gross acute skeletal abnormality. IMPRESSION: No acute cardiopulmonary disease. Electronically Signed   By: 09/01/2019  M.D.   On: 07/11/2020 13:38    Patient Profile     Pamela Dunn is a 87 y.o. female with a history of RA, LBBB, NICM, HTN, HLD, and severe DJD who is being seen today for the evaluation of syncope at the request of Dr. Ganji.  Assessment & Plan    1.  Syncope in the setting of  transient CHB Previously saw Dr. Ganji for same 06/11/20, had plans for observation +/- loop.  Last dose of toprol 5/3 pm. LBBB ~ 137 ms at baseline. With only intermittent pacing likely needed, would likely not plan CRT at this time.  Explained risks, benefits, and alternatives to PPM implantation, including but not limited to bleeding, infection, pneumothorax, pericardial effusion, lead  dislodgement, heart attack, stroke, or death.  Pt verbalized understanding and agrees to proceed if indicated.   2. NICM Myoview 08/2018 with LVEF 24% and no ischemia other than a "very small sizes apical defect" Echo 06/16/2019 LVEF 40-45% (20% 08/2018) Continue GDMT as tolerated post PPM.   She is tentatively scheduled for PPM this afternoon. She understands that with her stable hemodynamics and currently stable rhythm. She may be deferred until tomorrow if an urgent/emergent case comes in. She is NPO after clear breakfast.   For questions or updates, please contact [facility] Please consult [email or web address] for contact info under Cardiology/STEMI.  Signed, Michael Andrew Tillery, PA-C  07/12/2020, 8:55 AM    I have seen, examined the patient, and reviewed the above assessment and plan.  Changes to above are made where necessary.  On exam, RRR.  The patient has symptomatic transient complete heart block with recurrent syncope.  I agree with Dr Ganji that she requires medical therapy with beta blockers for treatment of her CHF long term.  Given advanced age and EF > 35%, I would not advise ICD.  We will plan PPM at this time.  Risks, benefits, alternatives to pacemaker implantation were discussed in detail with the patient today. The patient understands that the risks include but are not limited to bleeding, infection, pneumothorax, perforation, tamponade, vascular damage, renal failure, MI, stroke, death,  and lead dislodgement and wishes to proceed. We will therefore schedule the procedure at the next available time.   Co Sign: Vick Filter, MD 07/12/2020 2:14 PM   

## 2020-07-12 NOTE — Progress Notes (Addendum)
Electrophysiology Rounding Note  Patient Name: Pamela Dunn Date of Encounter: 07/12/2020  Primary Cardiologist: Dr. Jacinto Halim Electrophysiologist: New to Dr. Johney Frame   Subjective   The patient is doing well today.  At this time, the patient denies chest pain, shortness of breath, or any new concerns.  No further HB noted on tele  Inpatient Medications    Scheduled Meds: . Chlorhexidine Gluconate Cloth  6 each Topical Q0600  . ezetimibe  10 mg Oral Daily  . isosorbide-hydrALAZINE  1 tablet Oral TID  . levothyroxine  25 mcg Oral Daily  . mupirocin ointment  1 application Nasal BID  . predniSONE  5 mg Oral Daily   Continuous Infusions:  PRN Meds:    Vital Signs    Vitals:   07/11/20 1920 07/11/20 2300 07/12/20 0300 07/12/20 0727  BP: (!) 137/56 124/62 (!) 137/57 120/63  Pulse: 60 63 61 64  Resp: 15 18 17 17   Temp: 98.3 F (36.8 C) 98.1 F (36.7 C) 98.3 F (36.8 C) 98.4 F (36.9 C)  TempSrc: Oral Oral Oral Oral  SpO2: 94% 95% 91% 96%  Weight:      Height:        Intake/Output Summary (Last 24 hours) at 07/12/2020 0855 Last data filed at 07/11/2020 2300 Gross per 24 hour  Intake 360 ml  Output 550 ml  Net -190 ml   Filed Weights   07/11/20 1713  Weight: 62.2 kg    Physical Exam    GEN- The patient is well appearing, alert and oriented x 3 today.   Head- normocephalic, atraumatic Eyes-  Sclera clear, conjunctiva pink Ears- hearing intact Oropharynx- clear Neck- supple Lungs- Clear to ausculation bilaterally, normal work of breathing Heart- Regular rate and rhythm, no murmurs, rubs or gallops GI- soft, NT, ND, + BS Extremities- no clubbing or cyanosis. No edema Skin- no rash or lesion Psych- euthymic mood, full affect Neuro- strength and sensation are intact  Labs    CBC Recent Labs    07/11/20 1258  WBC 12.9*  NEUTROABS 10.6*  HGB 11.9*  HCT 38.0  MCV 94.3  PLT 280   Basic Metabolic Panel Recent Labs    09/10/20 1258  NA 137  K 4.0   CL 104  CO2 23  GLUCOSE 112*  BUN 30*  CREATININE 1.15*  CALCIUM 9.0  MG 1.8   Liver Function Tests No results for input(s): AST, ALT, ALKPHOS, BILITOT, PROT, ALBUMIN in the last 72 hours. No results for input(s): LIPASE, AMYLASE in the last 72 hours. Cardiac Enzymes No results for input(s): CKTOTAL, CKMB, CKMBINDEX, TROPONINI in the last 72 hours.   Telemetry    Sinus brady/NSR 50-60s (personally reviewed)  Radiology    DG Chest Portable 1 View  Result Date: 07/11/2020 CLINICAL DATA:  Pt arrived via [facility]. EMS reports that pt had near syncope while sitting in recliner at home, but pt denies losing consciousness. Pt denies fall. EMS reports pt had multiple events of pt becoming bradycardic and had a few times while in care that pt c/o feeling near syncope. Pt denies all chest complaints at this time. EXAM: PORTABLE CHEST 1 VIEW COMPARISON:  08/30/2019 FINDINGS: Cardiac silhouette is normal in size. No mediastinal or hilar masses. Linear scar in the left mid lung, stable.  Lungs otherwise clear. No pleural effusion or pneumothorax. Partly imaged left shoulder prosthesis appears well aligned. No gross acute skeletal abnormality. IMPRESSION: No acute cardiopulmonary disease. Electronically Signed   By: 09/01/2019  M.D.   On: 07/11/2020 13:38    Patient Profile     Pamela Dunn is a 87 y.o. female with a history of RA, LBBB, NICM, HTN, HLD, and severe DJD who is being seen today for the evaluation of syncope at the request of Dr. Jacinto Halim.  Assessment & Plan    1.  Syncope in the setting of  transient CHB Previously saw Dr. Jacinto Halim for same 06/11/20, had plans for observation +/- loop.  Last dose of toprol 5/3 pm. LBBB ~ 137 ms at baseline. With only intermittent pacing likely needed, would likely not plan CRT at this time.  Explained risks, benefits, and alternatives to PPM implantation, including but not limited to bleeding, infection, pneumothorax, pericardial effusion, lead  dislodgement, heart attack, stroke, or death.  Pt verbalized understanding and agrees to proceed if indicated.   2. NICM Myoview 08/2018 with LVEF 24% and no ischemia other than a "very small sizes apical defect" Echo 06/16/2019 LVEF 40-45% (20% 08/2018) Continue GDMT as tolerated post PPM.   She is tentatively scheduled for PPM this afternoon. She understands that with her stable hemodynamics and currently stable rhythm. She may be deferred until tomorrow if an urgent/emergent case comes in. She is NPO after clear breakfast.   For questions or updates, please contact [facility] Please consult [email or web address] for contact info under Cardiology/STEMI.  Signed, Graciella Freer, PA-C  07/12/2020, 8:55 AM    I have seen, examined the patient, and reviewed the above assessment and plan.  Changes to above are made where necessary.  On exam, RRR.  The patient has symptomatic transient complete heart block with recurrent syncope.  I agree with Dr Jacinto Halim that she requires medical therapy with beta blockers for treatment of her CHF long term.  Given advanced age and EF > 35%, I would not advise ICD.  We will plan PPM at this time.  Risks, benefits, alternatives to pacemaker implantation were discussed in detail with the patient today. The patient understands that the risks include but are not limited to bleeding, infection, pneumothorax, perforation, tamponade, vascular damage, renal failure, MI, stroke, death,  and lead dislodgement and wishes to proceed. We will therefore schedule the procedure at the next available time.   Co Sign: Hillis Range, MD 07/12/2020 2:14 PM

## 2020-07-12 NOTE — Progress Notes (Signed)
Subjective:  No new symptomatology, she has not had any recurrence of heart block.  No chest pain, no PND or orthopnea.  Intake/Output from previous day:  I/O last 3 completed shifts: In: 120 [P.O.:120] Out: -  Total I/O In: 240 [P.O.:240] Out: 550 [Urine:550]  Blood pressure (!) 137/57, pulse 61, temperature 98.3 F (36.8 C), temperature source Oral, resp. rate 17, height 5' (1.524 m), weight 62.2 kg, SpO2 91 %. Physical Exam Constitutional:      General: She is not in acute distress.    Appearance: She is well-developed. She is obese.     Comments: Short stature  HENT:     Mouth/Throat:     Mouth: Mucous membranes are moist.  Eyes:     Extraocular Movements: Extraocular movements intact.  Cardiovascular:     Rate and Rhythm: Normal rate and regular rhythm.     Pulses: Intact distal pulses.     Heart sounds: No murmur heard. No gallop.      Comments: S1 is normal, S2 is paradoxically split. No JVD. No pedal edema. Pulmonary:     Effort: Pulmonary effort is normal.     Breath sounds: Normal breath sounds.  Abdominal:     General: Bowel sounds are normal.     Palpations: Abdomen is soft.  Musculoskeletal:        General: Deformity (burnt out RA in hands) present. Normal range of motion.     Cervical back: Normal range of motion and neck supple.  Skin:    General: Skin is warm.     Capillary Refill: Capillary refill takes less than 2 seconds.  Neurological:     General: No focal deficit present.     Mental Status: She is oriented to person, place, and time.     Lab Results: BMP BNP (last 3 results) Recent Labs    08/26/19 0538 08/29/19 0359  BNP 721.4* 103.3*   No results for input(s): PROBNP in the last 8760 hours. BMP Latest Ref Rng & Units 07/11/2020 09/02/2019 09/01/2019  Glucose 70 - 99 mg/dL 112(H) 106(H) 91  BUN 8 - 23 mg/dL 30(H) 29(H) 29(H)  Creatinine 0.44 - 1.00 mg/dL 1.15(H) 1.23(H) 1.49(H)  Sodium 135 - 145 mmol/L 137 136 136  Potassium 3.5 -  5.1 mmol/L 4.0 3.7 3.6  Chloride 98 - 111 mmol/L 104 103 103  CO2 22 - 32 mmol/L 23 22 26  Calcium 8.9 - 10.3 mg/dL 9.0 7.9(L) 7.4(L)   Hepatic Function Latest Ref Rng & Units 08/31/2019 08/30/2019 08/29/2019  Total Protein 6.5 - 8.1 g/dL 5.1(L) 4.4(L) 4.3(L)  Albumin 3.5 - 5.0 g/dL 2.0(L) 1.6(L) 1.6(L)  AST 15 - 41 U/L 22 25 37  ALT 0 - 44 U/L 18 20 23  Alk Phosphatase 38 - 126 U/L 96 93 96  Total Bilirubin 0.3 - 1.2 mg/dL 0.6 0.9 0.7   CBC Latest Ref Rng & Units 07/11/2020 09/02/2019 09/01/2019  WBC 4.0 - 10.5 K/uL 12.9(H) 17.6(H) 21.4(H)  Hemoglobin 12.0 - 15.0 g/dL 11.9(L) 9.0(L) 8.6(L)  Hematocrit 36.0 - 46.0 % 38.0 28.2(L) 26.7(L)  Platelets 150 - 400 K/uL 280 360 331   Lipid Panel  No results found for: CHOL, TRIG, HDL, CHOLHDL, VLDL, LDLCALC, LDLDIRECT Cardiac Panel (last 3 results) No results for input(s): CKTOTAL, CKMB, TROPONINI, RELINDX in the last 72 hours.  HEMOGLOBIN A1C No results found for: HGBA1C, MPG TSH Recent Labs    07/11/20 1748  TSH 1.565   Imaging: DG Chest Portable 1 View    07/11/2020 Cardiac silhouette is normal in size. No mediastinal or hilar masses. Linear scar in the left mid lung, stable.  Lungs otherwise clear. No pleural effusion or pneumothorax.  Partly imaged left shoulder prosthesis appears well aligned. No gross acute skeletal abnormality.   Cardiac Studies:   Lexiscan Myoview Stress Test06/29/2020: Resting EKG demonstrates normal sinus rhythm. Left bundle branch block. Stress EKG is non-diagnostic as it's a pharmacologic stress test. Nuclear images reveal left ventricle to be mildly dilated both in rest and stress images with end-diastolic volume of 138 mL. The perfusion imaging study demonstrates a moderate sized inferior and inferolateral decreased uptake suggestive of soft tissue attenuation. In addition there is a very small sized apical septal defect suggestive of ischemia. The left ventricle systolic function calculated by QGS was  24% with global hypokinesis. This represents a high risk study in view of low EF, clinical correlation recommended. Findings may represent non ischemic dilated cardiomyopathy.  Nocturnal oximetry 12/02/2018: SPO2 <88% 23 minutes, <89% 37 minutes, highest SPO2 99%, lowest SPO2 69%. Oxygen desaturation events 3% (316 events). Patient prescribed nocturnal oxygen supplementation. Now off O2 due to resolution of fatigue.  Echocardiogram 07/12/2020:  1. Left ventricular ejection fraction, by estimation, is 45%. The left ventricle has mildly decreased function. There is moderate concentric left ventricular hypertrophy. Left ventricular diastolic parameters are consistent with Grade I diastolic  dysfunction (impaired relaxation). Elevated left atrial pressure.  2. Right ventricular systolic function is normal. The right ventricular size is normal.  3. Right atrial size was mildly dilated.  4. The mitral valve is grossly normal. No evidence of mitral valve regurgitation.  5. Tricuspid valve regurgitation is mild to moderate.  6. The aortic valve is tricuspid. Aortic valve regurgitation is not visualized. No aortic stenosis is present.  7. The inferior vena cava is normal in size with greater than 50% respiratory variability, suggesting right atrial pressure of 3 mmHg. No significant change from 06/16/2019    EKG:  EKG 07/11/2020: Normal sinus rhythm at rate of 66 beats minute, leftward enlargement, left bundle branch block.  No further analysis.  No significant change from 06/11/2020.   3-lead EKG 07/11/2020 by EMS: Complete heart block with no ventricular escape.   No results found for this or any previous visit (from the past 43800 hour(s)).  Scheduled Meds: . Chlorhexidine Gluconate Cloth  6 each Topical Q0600  . ezetimibe  10 mg Oral Daily  . heparin  5,000 Units Subcutaneous Q8H  . isosorbide-hydrALAZINE  1 tablet Oral TID  . levothyroxine  25 mcg Oral Daily  . mupirocin ointment  1  application Nasal BID  . predniSONE  5 mg Oral Daily   Continuous Infusions: PRN Meds:.  Assessment/Plan:  Pamela Dunn  is a 88 y.o. Caucasian femalewith with long-standing history of recurrent rheumatoid arthritis, left bundle branch block, nonischemic cardiomyopathy with negative nuclear stress test and EF 20% in July 2020, improved to 40-45% by medical therapy, hypertension, hyperlipidemia, severe DJD and RA with arthritis and using a cane to walk. Nocturnal oximetry in September 2020 revealed severe hypoxemiaand shewason nocturnal oxygen supplementation.  I seen Pamela Dunn on 06/11/2020 when she was having recurrent episodes of syncope.  In view of cardiomyopathy and underlying left bundle branch block and advanced age, both etiology for NSVT and heart block was in question, she was recommended a loop recorder implantation for evaluation of the same.   1.  Complete heart block, awaiting pacemaker implantation. 2.  Chronic systolic heart failure, LVEF stable   around 40 to 45% by echocardiogram done today with no significant valvular abnormality. 3.  Primary hypertension  Recommendation: Patient is presently stable from cardiac standpoint, beta-blocker has been held in view of complete heart block and is awaiting pacemaker implantation.  Previously the echocardiogram in 2020 had revealed severe LV systolic dysfunction which improved with medical therapy including beta-blocker therapy.  Hence patient will need pacemaker implantation so we can optimize Pamela Dunn medical care.  Hopefully if pacemaker procedure goes well today, can be discharged home tomorrow.  Blood pressure is well controlled on vital.  No changes were done with medications today.  No clinical evidence of heart failure.   Adrian Prows, MD, [facility] 07/12/2020, 7:00 AM Office: [phone] Pager: [phone]

## 2020-07-12 NOTE — Evaluation (Signed)
Physical Therapy Evaluation Patient Details Name: Pamela Dunn MRN: [id number] DOB: 11-Apr-1931 Today's Date: 07/12/2020   History of Present Illness  87 yo admitted 5/4 with syncope while sitting with complete heart block. PPM planned 5/5. PMhx: HTN, HLD, LBBB, RA, severe DJD, SBO, hypothyroidism  Clinical Impression  Pt very pleasant and reports living alone with aide 1x/wk for bath, cleaning and son assist for groceries. Pt reports being able to walk household distances with RW and struggling with cooking. Pt with VSS throughout session and decreased activity tolerance compared to normal. Pt educated for PPM precautions and will benefit from repetition post surgery to see what additional assist pt may require at home with restrictions. Pt with decreased strength, function and activity tolerance who will benefit from acute therapy to maximize mobility and adherence to precautions.   Supine 135/65 (86), HR 70 Sitting 150/66 (92), HR 74 Standing 139/65 (86), HR 71    Follow Up Recommendations Home health PT    Equipment Recommendations  None recommended by PT    Recommendations for Other Services       Precautions / Restrictions Precautions Precautions: Fall Precaution Comments: educated for pacemaker precautions for post op      Mobility  Bed Mobility Overal bed mobility: Modified Independent             General bed mobility comments: use of momentum with increased time to transition to sitting    Transfers Overall transfer level: Needs assistance   Transfers: Sit to/from Stand Sit to Stand: Supervision;From elevated surface         General transfer comment: elevated surface with momentum to stand  Ambulation/Gait Ambulation/Gait assistance: Supervision Gait Distance (Feet): 100 Feet Assistive device: Rolling walker (2 wheeled) Gait Pattern/deviations: Step-through pattern;Decreased stride length;Trunk flexed   Gait velocity interpretation: 1.31 - 2.62  ft/sec, indicative of limited community ambulator General Gait Details: cues for direction with pt able to self regulate distance  Stairs            Wheelchair Mobility    Modified Rankin (Stroke Patients Only)       Balance Overall balance assessment: Needs assistance   Sitting balance-Leahy Scale: Fair     Standing balance support: Bilateral upper extremity supported Standing balance-Leahy Scale: Poor Standing balance comment: RW for standing                             Pertinent Vitals/Pain Pain Assessment: No/denies pain    Home Living Family/patient expects to be discharged to:: Private residence Living Arrangements: Alone Available Help at Discharge: Family;Available PRN/intermittently Type of Home: House Home Access: Ramped entrance     Home Layout: One level Home Equipment: Walker - 2 wheels;Shower seat;Grab bars - tub/shower;Cane - single point;Other (comment) (tray on wheels for kitchen)      Prior Function Level of Independence: Needs assistance   Gait / Transfers Assistance Needed: walks with RW in home grossly 140'  ADL's / Homemaking Assistance Needed: sponge bathes and dresses on her own. aide 1x/wk for bath, family does the grocery shopping, quick fix meals        Hand Dominance        Extremity/Trunk Assessment   Upper Extremity Assessment Upper Extremity Assessment: Generalized weakness (bil UE with limited ROM)    Lower Extremity Assessment Lower Extremity Assessment: Generalized weakness    Cervical / Trunk Assessment Cervical / Trunk Assessment: Kyphotic  Communication   Communication: No difficulties  Cognition Arousal/Alertness: Awake/alert Behavior During Therapy: WFL for tasks assessed/performed Overall Cognitive Status: Within Functional Limits for tasks assessed                                        General Comments      Exercises     Assessment/Plan    PT Assessment Patient  needs continued PT services  PT Problem List Decreased mobility;Decreased activity tolerance;Decreased strength;Decreased range of motion;Decreased balance       PT Treatment Interventions Gait training;Functional mobility training;Therapeutic activities;Patient/family education;Therapeutic exercise;DME instruction    PT Goals (Current goals can be found in the Care Plan section)  Acute Rehab PT Goals Patient Stated Goal: return  home to my computer PT Goal Formulation: With patient/family Time For Goal Achievement: 07/26/20 Potential to Achieve Goals: Good    Frequency Min 3X/week   Barriers to discharge Decreased caregiver support      Co-evaluation               AM-PAC PT "6 Clicks" Mobility  Outcome Measure Help needed turning from your back to your side while in a flat bed without using bedrails?: A Little Help needed moving from lying on your back to sitting on the side of a flat bed without using bedrails?: A Little Help needed moving to and from a bed to a chair (including a wheelchair)?: A Little Help needed standing up from a chair using your arms (e.g., wheelchair or bedside chair)?: A Little Help needed to walk in hospital room?: A Little Help needed climbing 3-5 steps with a railing? : A Lot 6 Click Score: 17    End of Session   Activity Tolerance: Patient tolerated treatment well Patient left: in chair;with call bell/phone within reach;with family/visitor present Nurse Communication: Mobility status PT Visit Diagnosis: Other abnormalities of gait and mobility (R26.89);Difficulty in walking, not elsewhere classified (R26.2)    Time: 1607-3710 PT Time Calculation (min) (ACUTE ONLY): 27 min   Charges:   PT Evaluation $PT Eval Moderate Complexity: 1 Mod PT Treatments $Therapeutic Activity: 8-22 mins        Ladavia Lindenbaum P, PT Acute Rehabilitation Services Pager: [phone] Office: [phone]   Elley Harp B Kendell Sagraves 07/12/2020, 11:41 AM

## 2020-07-12 NOTE — Progress Notes (Incomplete)
  Echocardiogram 2D Echocardiogram has been performed.  Pamela Dunn 07/12/2020, 8:33 AM

## 2020-07-13 ENCOUNTER — Inpatient Hospital Stay (HOSPITAL_COMMUNITY): Payer: Medicare PPO

## 2020-07-13 ENCOUNTER — Encounter (HOSPITAL_COMMUNITY): Payer: Self-pay | Admitting: Internal Medicine

## 2020-07-13 NOTE — Care Management Important Message (Signed)
Important Message  Patient Details  Name: Pamela Dunn MRN: [id number] Date of Birth: 13-Apr-1931   Medicare Important Message Given:  Yes     Dorena Bodo 07/13/2020, 2:01 PM

## 2020-07-13 NOTE — Progress Notes (Signed)
Pt's son retrieved car to valet parking. This nurse transported pt to his waiting vehicle, via wheelchair, in stable condition.

## 2020-07-13 NOTE — Progress Notes (Signed)
Physical Therapy Treatment Patient Details Name: Pamela Dunn MRN: [id number] DOB: Jun 20, 1931 Today's Date: 07/13/2020    History of Present Illness 87 yo admitted 5/4 with syncope while sitting with complete heart block. PPM planned 5/5. PMhx: HTN, HLD, LBBB, RA, severe DJD, SBO, hypothyroidism    PT Comments    Pt very pleasant and educated for all precautions with sling on at rest before and after session. Pt with increased struggle getting to EOB and states she plans to have son, friend and sister assist at D/c as well as not sliding as far onto her mattress at home. Pt typically stands from lift chair and elevated surfaces at home. Education for dressing modification with PPM precautions. Pt with good awareness of precautions with HHPT still appropriate.     Follow Up Recommendations  Home health PT     Equipment Recommendations  None recommended by PT    Recommendations for Other Services       Precautions / Restrictions Precautions Precautions: Fall;ICD/Pacemaker Precaution Comments: pt educated for all precautions    Mobility  Bed Mobility Overal bed mobility: Needs Assistance Bed Mobility: Supine to Sit     Supine to sit: Min assist;HOB elevated     General bed mobility comments: HOb 25 degrees with good use of RUE and not using LUE with min assist to fully scoot to EOB with increased time    Transfers Overall transfer level: Needs assistance   Transfers: Sit to/from Stand Sit to Stand: Supervision;From elevated surface         General transfer comment: elevated surface with momentum to stand, good adherence to precautions  Ambulation/Gait Ambulation/Gait assistance: Supervision Gait Distance (Feet): 100 Feet Assistive device: Rolling walker (2 wheeled) Gait Pattern/deviations: Step-through pattern;Decreased stride length;Trunk flexed   Gait velocity interpretation: 1.31 - 2.62 ft/sec, indicative of limited community ambulator General Gait Details:  pt self regulating distance with good position of RW   Stairs             Wheelchair Mobility    Modified Rankin (Stroke Patients Only)       Balance Overall balance assessment: Needs assistance Sitting-balance support: No upper extremity supported;Feet supported Sitting balance-Leahy Scale: Fair     Standing balance support: Bilateral upper extremity supported Standing balance-Leahy Scale: Poor Standing balance comment: RW for standing                            Cognition Arousal/Alertness: Awake/alert Behavior During Therapy: WFL for tasks assessed/performed Overall Cognitive Status: Within Functional Limits for tasks assessed                                        Exercises      General Comments        Pertinent Vitals/Pain Pain Assessment: No/denies pain    Home Living                      Prior Function            PT Goals (current goals can now be found in the care plan section) Progress towards PT goals: Progressing toward goals    Frequency    Min 3X/week      PT Plan Current plan remains appropriate    Co-evaluation  AM-PAC PT "6 Clicks" Mobility   Outcome Measure  Help needed turning from your back to your side while in a flat bed without using bedrails?: A Little Help needed moving from lying on your back to sitting on the side of a flat bed without using bedrails?: A Little Help needed moving to and from a bed to a chair (including a wheelchair)?: A Little Help needed standing up from a chair using your arms (e.g., wheelchair or bedside chair)?: A Little Help needed to walk in hospital room?: A Little Help needed climbing 3-5 steps with a railing? : A Lot 6 Click Score: 17    End of Session   Activity Tolerance: Patient tolerated treatment well Patient left: in chair;with call bell/phone within reach;with family/visitor present Nurse Communication: Mobility status PT  Visit Diagnosis: Other abnormalities of gait and mobility (R26.89);Difficulty in walking, not elsewhere classified (R26.2)     Time: 0867-6195 PT Time Calculation (min) (ACUTE ONLY): 23 min  Charges:  $Gait Training: 8-22 mins $Therapeutic Activity: 8-22 mins                     Johnthan Axtman P, PT Acute Rehabilitation Services Pager: [phone] Office: [phone]    Edmund Rick B Jyles Sontag 07/13/2020, 12:15 PM

## 2020-07-13 NOTE — Progress Notes (Signed)
Blood pressures soft. BP meds held. Dr Jacinto Halim notified.

## 2020-07-13 NOTE — Discharge Summary (Signed)
Physician Discharge Summary  Patient ID: NYJA RINDT MRN: [id number] DOB/AGE: [birth year] 87 y.o. Pcp, No   Admit date: 07/11/2020 Discharge date: 07/13/2020  Primary Discharge Diagnosis Complete heart block and cardiac syncope Significant Diagnostic Studies: Chronic systolic heart failure, mild decrease in LVEF by recent echocardiogram at 45%. Chronic LBBB Primary hypertension  Chest x-ray PA and lateral view 07/13/2020: Pacemaker present with lead tips attached to right atrium and right ventricle. No pneumothorax. Focal atelectasis left mid lung. No edema or consolidation. Stable cardiac silhouette.  Aortic Atherosclerosis   Permanent transvenous pacemaker plantation 07/13/2020: 1. Successful implantation of an Abbott Assurity MRI conditional  dual-chamber pacemaker for symptomatic transient complete heart block with syncope  Lexiscan Myoview Stress Test06/29/2020: Resting EKG demonstrates normal sinus rhythm. Left bundle branch block. Stress EKG is non-diagnostic as it's a pharmacologic stress test. Nuclear images reveal left ventricle to be mildly dilated both in rest and stress images with end-diastolic volume of 138 mL. The perfusion imaging study demonstrates a moderate sized inferior and inferolateral decreased uptake suggestive of soft tissue attenuation. In addition there is a very small sized apical septal defect suggestive of ischemia. The left ventricle systolic function calculated by QGS was 24% with global hypokinesis. This represents a high risk study in view of low EF, clinical correlation recommended. Findings may represent non ischemic dilated cardiomyopathy.  Nocturnal oximetry 12/02/2018: SPO2 <88% 23 minutes, <89% 37 minutes, highest SPO2 99%, lowest SPO2 69%. Oxygen desaturation events 3% (316 events). Patient prescribed nocturnal oxygen supplementation. Now off O2 due to resolution of fatigue.  Echocardiogram 07/12/2020:  1. Left ventricular ejection  fraction, by estimation, is 45%. The left ventricle has mildly decreased function. There is moderate concentric left ventricular hypertrophy. Left ventricular diastolic parameters are consistent with Grade I diastolic  dysfunction (impaired relaxation). Elevated left atrial pressure.  2. Right ventricular systolic function is normal. The right ventricular size is normal.  3. Right atrial size was mildly dilated.  4. The mitral valve is grossly normal. No evidence of mitral valve regurgitation.  5. Tricuspid valve regurgitation is mild to moderate.  6. The aortic valve is tricuspid. Aortic valve regurgitation is not visualized. No aortic stenosis is present.  7. The inferior vena cava is normal in size with greater than 50% respiratory variability, suggesting right atrial pressure of 3 mmHg. No significant change from 06/16/2019    EKG:  EKG 07/11/2020: Normal sinus rhythm at rate of 66 beats minute, leftward enlargement, left bundle branch block.  No further analysis.  No significant change from 06/11/2020.   3-lead EKG 07/11/2020 by EMS: Complete heart block with no ventricular escape.   Hospital Course: JARYN CAUGHELL is a 87 y.o. female  patient with with long-standing history of recurrent rheumatoid arthritis, left bundle branch block, nonischemic cardiomyopathy with negative nuclear stress test and EF 20% in July 2020, improved to 40-45% by medical therapy, hypertension, hyperlipidemia, severe DJD and RA with arthritis and using a cane to walk. Nocturnal oximetry in September 2020 revealed severe hypoxemiaand shewason nocturnal oxygen supplementation.  I seen her on 06/11/2020 when she was having recurrent episodes of syncope. In view of cardiomyopathy and underlying left bundle branch block and advanced age, both etiology for NSVT and heart block was in question, she was recommended a loop recorder implantation for evaluation of the same.   However while she was waiting to check at home on  the day of admission to the hospital, had syncope hence EMS was activated and upon arrival she  was alert however on EKG work-up revealed complete heart block with underlying sinus with no ventricular escape.  Etiology for her syncope was felt to be due to complete heart block.  She was evaluated by EP, was recommended pacemaker implantation.  Echocardiogram had revealed mild decrease in LVEF.  As there is no clinical evidence of acute decompensated heart failure, as she had done well, advanced age, did not feel she needed a BiV ICD or BiV pacemaker.  Recommendations on discharge: She had severe LV systolic dysfunction previously and not done well on beta-blocker therapy.  Hence we will restart beta-blockers and due to hyperkalemia and renal insufficiency on ACE inhibitor's, presently on BiDil, continue the same.  Home health has been set up in view of high risk for fall and potential for lead disruption.  Patient will be going home to her son's house for now and then eventually moved back to her house.  She continues to live independently although 87 years of age.  Discharge Exam: Vitals with BMI 07/13/2020 07/13/2020 07/13/2020  Height - - -  Weight - - -  BMI - - -  Systolic 116 95 101  Diastolic 52 44 42  Pulse 67 68 68     Physical Exam Constitutional:      General: She is not in acute distress.    Appearance: She is well-developed. She is obese.     Comments: Short stature  HENT:     Mouth/Throat:     Mouth: Mucous membranes are moist.  Eyes:     Extraocular Movements: Extraocular movements intact.  Cardiovascular:     Rate and Rhythm: Normal rate and regular rhythm.     Pulses: Intact distal pulses.     Heart sounds: No murmur heard. No gallop.      Comments: S1 is normal, S2 is paradoxically split. No JVD. No pedal edema. Pulmonary:     Effort: Pulmonary effort is normal.     Breath sounds: Normal breath sounds.  Abdominal:     General: Bowel sounds are normal.      Palpations: Abdomen is soft.  Musculoskeletal:        General: Deformity (burnt out RA in hands) present. Normal range of motion.     Cervical back: Normal range of motion and neck supple.  Skin:    General: Skin is warm.     Capillary Refill: Capillary refill takes less than 2 seconds.  Neurological:     General: No focal deficit present.     Mental Status: She is oriented to person, place, and time.     Labs:   Lab Results  Component Value Date   WBC 12.9 (H) 07/11/2020   HGB 11.9 (L) 07/11/2020   HCT 38.0 07/11/2020   MCV 94.3 07/11/2020   PLT 280 07/11/2020    Recent Labs  Lab 07/11/20 1258  NA 137  K 4.0  CL 104  CO2 23  BUN 30*  CREATININE 1.15*  CALCIUM 9.0  GLUCOSE 112*    Lipid Panel  No results found for: CHOL, TRIG, HDL, CHOLHDL, VLDL, LDLCALC  BNP (last 3 results) Recent Labs    08/26/19 0538 08/29/19 0359  BNP 721.4* 103.3*    HEMOGLOBIN A1C No results found for: HGBA1C, MPG  TSH Recent Labs    07/11/20 1748  TSH 1.565    FOLLOW UP PLANS AND APPOINTMENTS Discharge Instructions    Call MD for:  temperature >100.4   Complete by: As directed  Diet - low sodium heart healthy   Complete by: As directed    Discharge wound care:   Complete by: As directed    Please follow pacemaker wound care instructions   Increase activity slowly   Complete by: As directed      Allergies as of 07/13/2020      Reactions   Coreg [carvedilol] Diarrhea      Medication List    TAKE these medications   Aspirin-Caffeine 500-32.5 MG Tabs Take 1 tablet by mouth daily as needed (pain).   BiDil 20-37.5 MG tablet Generic drug: isosorbide-hydrALAZINE TAKE 1 TABLET BY MOUTH THREE TIMES A DAY   cholecalciferol 1000 units tablet Commonly known as: VITAMIN D Take 1,000 Units by mouth daily.   CoQ10 200 MG Caps Take 200 mg by mouth daily.   ezetimibe 10 MG tablet Commonly known as: ZETIA Take 10 mg by mouth daily.   folic acid 1 MG tablet Commonly  known as: FOLVITE Take 1 mg by mouth daily.   levothyroxine 25 MCG tablet Commonly known as: SYNTHROID Take 25 mcg by mouth daily.   metoprolol succinate 50 MG 24 hr tablet Commonly known as: TOPROL-XL TAKE 1 TABLET (50 MG TOTAL) BY MOUTH DAILY. TAKE WITH OR IMMEDIATELY FOLLOWING A MEAL. What changed: when to take this   predniSONE 5 MG tablet Commonly known as: DELTASONE Take 5 mg by mouth daily.            Discharge Care Instructions  (From admission, onward)         Start     Ordered   07/13/20 0000  Discharge wound care:       Comments: Please follow pacemaker wound care instructions   07/13/20 1143          Follow-up Information    [facility] Follow up.   Why: on 5/17 at 0840 for post hospital pacemaker check Contact information: [address] [place] [address] [phone]       [facility] Schedule an appointment as soon as possible for a visit.   Specialty: Rheumatology Why: Please make an appointment in the next 7-10 to follow up with your primary care physician / PA at their office. Contact information: [address] [place] [address] [phone]        [facility] Follow up.   Specialty: Home Health Services Why: [facility] will be providing you with home health services and will call you in the next 24-48 hours to set up services for physical therapy and aide. Contact information: [address] [place] [address] [phone]        Yates Decamp, MD Follow up on 07/26/2020.   Specialty: Cardiology Why: 11:45 AM appointment Contact information: [address] [place] [address] [phone]                Yates Decamp, MD, [facility] 07/13/2020, 12:47 PM Office: [phone]

## 2020-07-13 NOTE — Progress Notes (Addendum)
Electrophysiology Rounding Note  Patient Name: Pamela Dunn Date of Encounter: 07/13/2020  Primary Cardiologist: Dr. Jacinto Halim Electrophysiologist: New to Dr Johney Frame   Subjective   The patient is doing well today.  At this time, the patient denies chest pain, shortness of breath, or any new concerns.   Left shoulder mildly sore  Inpatient Medications    Scheduled Meds: . ezetimibe  10 mg Oral Daily  . isosorbide-hydrALAZINE  1 tablet Oral TID  . levothyroxine  25 mcg Oral Daily  . metoprolol succinate  50 mg Oral Daily  . mupirocin ointment  1 application Nasal BID  . predniSONE  5 mg Oral Daily  . sodium chloride flush  3 mL Intravenous Q12H   Continuous Infusions: . sodium chloride    .  ceFAZolin (ANCEF) IV 1 g (07/13/20 0359)   PRN Meds: sodium chloride, acetaminophen, diclofenac Sodium, HYDROcodone-acetaminophen, ondansetron (ZOFRAN) IV, sodium chloride flush   Vital Signs    Vitals:   07/12/20 1737 07/12/20 1948 07/12/20 2330 07/13/20 0347  BP: (!) 148/64 (!) 101/45 (!) 114/51 132/62  Pulse:  76 74 75  Resp:  16 14 14   Temp:  98.1 F (36.7 C) 98.4 F (36.9 C) 98.1 F (36.7 C)  TempSrc:  Oral Oral Oral  SpO2:  97% 96% 98%  Weight:      Height:        Intake/Output Summary (Last 24 hours) at 07/13/2020 0736 Last data filed at 07/13/2020 0348 Gross per 24 hour  Intake --  Output 1050 ml  Net -1050 ml   Filed Weights   07/11/20 1713  Weight: 62.2 kg    Physical Exam    GEN- The patient is well appearing, alert and oriented x 3 today.   Head- normocephalic, atraumatic Eyes-  Sclera clear, conjunctiva pink Ears- hearing intact Oropharynx- clear Neck- supple Lungs- Clear to ausculation bilaterally, normal work of breathing Heart- Regular rate and rhythm, no murmurs, rubs or gallops GI- soft, NT, ND, + BS Extremities- no clubbing or cyanosis. No edema Skin- no rash or lesion Psych- euthymic mood, full affect Neuro- strength and sensation are  intact  Labs    CBC Recent Labs    07/11/20 1258  WBC 12.9*  NEUTROABS 10.6*  HGB 11.9*  HCT 38.0  MCV 94.3  PLT 280   Basic Metabolic Panel Recent Labs    09/10/20 1258  NA 137  K 4.0  CL 104  CO2 23  GLUCOSE 112*  BUN 30*  CREATININE 1.15*  CALCIUM 9.0  MG 1.8   Liver Function Tests No results for input(s): AST, ALT, ALKPHOS, BILITOT, PROT, ALBUMIN in the last 72 hours. No results for input(s): LIPASE, AMYLASE in the last 72 hours. Cardiac Enzymes No results for input(s): CKTOTAL, CKMB, CKMBINDEX, TROPONINI in the last 72 hours.   Telemetry    NSR 60-70s (personally reviewed)  Radiology    EP PPM/ICD IMPLANT  Result Date: 07/12/2020 SURGEON:  09/11/2020, MD   PREPROCEDURE DIAGNOSIS:  Transient complete heart block with syncope   POSTPROCEDURE DIAGNOSIS:  Transient complete heart block with syncope    PROCEDURES:  1. Left upper extremity venography.  2. Pacemaker implantation.   INTRODUCTION:  Pamela Dunn is a 87 y.o. female with a history of symptomatic transient complete heart block with syncop ewho presents today for pacemaker implantation.  The patient reports recurrent syncope.  She has been found to have AV block with prolonged RR Intervals as the cause.  No  reversible causes have been identified.  The patient therefore presents today for pacemaker implantation.   DESCRIPTION OF PROCEDURE:  Informed written consent was obtained, and  the patient was brought to the electrophysiology lab in a fasting state.  The patient required no sedation for the procedure today.  The patients left chest was prepped and draped in the usual sterile fashion by the EP lab staff. The skin overlying the left deltopectoral region was infiltrated with lidocaine for local analgesia.  A 4-cm incision was made over the left deltopectoral region.  A left subcutaneous pacemaker pocket was fashioned using a combination of sharp and blunt dissection. Electrocautery was required to assure  hemostasis.  She had very thin and friable tissue. Left Upper Extremity Venography: A venogram of the left upper extremity was performed, which revealed a large left axillary vein, which emptied into a large left subclavian vein.  The left cephalic vein was small in size.  RA/RV Lead Placement: The left axillary vein was therefore cannulated using a micropuncture technique.  Through the left axillary vein, an Abbott Tendril MRI model [id number]- 46 (serial number  [id number]) right atrial lead and an Abbott Tendril MRI model [id number]  (serial number  [id number]) right ventricular lead were advanced with fluoroscopic visualization into the right atrial appendage and right ventricular apex positions respectively.  Initial atrial lead P- waves measured 5 mV with impedance of 496 ohms and a threshold of 1.1 V at 1.0 msec.  Right ventricular lead R-waves measured 6 mV with an impedance of 700 ohms and a threshold of 0.9 V at 0.5 msec.  Both leads were secured to the pectoralis fascia using #2-0 silk over the suture sleeves. Device Placement:  The leads were then connected to an Abbott Assurity MRI model [id number] (serial number  [id number]) pacemaker.  The pocket was irrigated with copious gentamicin solution.  The pacemaker was then placed into the pocket.  The pocket was then closed in 2 layers with 2.0 Vicryl suture for the subcutaneous and subcuticular layers.  Steri-  Strips and a sterile dressing were then applied. EBL<4ml.  There were no early apparent complications. Permanent Pacemaker Indication: Documented non-reversible symptomatic bradycardia due to second degree and/or third degree atrioventricular block.    CONCLUSIONS:  1. Successful implantation of an Abbott Assurity MRI conditional  dual-chamber pacemaker for symptomatic transient complete heart block with syncope  2. No early apparent complications.       Hillis Range, MD 07/12/2020 5:22 PM   DG Chest Portable 1 View  Result Date: 07/11/2020 CLINICAL DATA:   Pt arrived via [facility]. EMS reports that pt had near syncope while sitting in recliner at home, but pt denies losing consciousness. Pt denies fall. EMS reports pt had multiple events of pt becoming bradycardic and had a few times while in care that pt c/o feeling near syncope. Pt denies all chest complaints at this time. EXAM: PORTABLE CHEST 1 VIEW COMPARISON:  08/30/2019 FINDINGS: Cardiac silhouette is normal in size. No mediastinal or hilar masses. Linear scar in the left mid lung, stable.  Lungs otherwise clear. No pleural effusion or pneumothorax. Partly imaged left shoulder prosthesis appears well aligned. No gross acute skeletal abnormality. IMPRESSION: No acute cardiopulmonary disease. Electronically Signed   By: Amie Portland M.D.   On: 07/11/2020 13:38   ECHOCARDIOGRAM LIMITED  Result Date: 07/12/2020    ECHOCARDIOGRAM LIMITED REPORT   Patient Name:   Pamela Dunn Date of Exam: 07/12/2020 Medical Rec #:  [id number]  Height:       60.0 in Accession #:    [id number]    Weight:       137.1 lb Date of Birth:  02-Dec-1931     BSA:          1.590 m Patient Age:    88 years      BP:           137/57 mmHg Patient Gender: F             HR:           61 bpm. Exam Location:  Inpatient Procedure: 2D Echo, Cardiac Doppler and Color Doppler Indications:    Heart block, Complete  History:        Patient has prior history of Echocardiogram examinations, most                 recent 06/16/2019. Risk Factors:Hypertension.  Sonographer:    Shirlean Kelly Referring Phys: [id number] MICHAEL ANDREW TILLERY IMPRESSIONS  1. Left ventricular ejection fraction, by estimation, is 45%. The left ventricle has mildly decreased function. There is moderate concentric left ventricular hypertrophy. Left ventricular diastolic parameters are consistent with Grade I diastolic dysfunction (impaired relaxation). Elevated left atrial pressure.  2. Right ventricular systolic function is normal. The right ventricular size is normal.  3. Right atrial  size was mildly dilated.  4. The mitral valve is grossly normal. No evidence of mitral valve regurgitation.  5. Tricuspid valve regurgitation is mild to moderate.  6. The aortic valve is tricuspid. Aortic valve regurgitation is not visualized. No aortic stenosis is present.  7. The inferior vena cava is normal in size with greater than 50% respiratory variability, suggesting right atrial pressure of 3 mmHg. Comparison(s): A prior study was performed on 07/14/2012. Prior images reviewed side by side. Decrease in LVEF, increase in LV hypertrophy. FINDINGS  Left Ventricle: Left ventricular ejection fraction, by estimation, is 45%. The left ventricle has mildly decreased function. The left ventricular internal cavity size was small. There is moderate concentric left ventricular hypertrophy. Left ventricular  diastolic parameters are consistent with Grade I diastolic dysfunction (impaired relaxation). Elevated left atrial pressure. Right Ventricle: The right ventricular size is normal. No increase in right ventricular wall thickness. Right ventricular systolic function is normal. Left Atrium: Left atrial size was normal in size. Right Atrium: Right atrial size was mildly dilated. Mitral Valve: The mitral valve is grossly normal. Tricuspid Valve: The tricuspid valve is normal in structure. Tricuspid valve regurgitation is mild to moderate. No evidence of tricuspid stenosis. Aortic Valve: The aortic valve is tricuspid. Aortic valve regurgitation is not visualized. No aortic stenosis is present. Aortic valve mean gradient measures 5.0 mmHg. Aortic valve peak gradient measures 9.2 mmHg. Aortic valve area, by VTI measures 1.56 cm. Pulmonic Valve: The pulmonic valve was grossly normal. Pulmonic valve regurgitation is mild. No evidence of pulmonic stenosis. Aorta: The aortic root and ascending aorta are structurally normal, with no evidence of dilitation. Venous: The inferior vena cava is normal in size with greater than 50%  respiratory variability, suggesting right atrial pressure of 3 mmHg. IAS/Shunts: The atrial septum is grossly normal. LEFT VENTRICLE PLAX 2D LVIDd:         3.60 cm     Diastology LVIDs:         2.80 cm     LV e' medial:    4.46 cm/s LV PW:         1.40 cm  LV E/e' medial:  18.0 LV IVS:        1.30 cm     LV e' lateral:   6.09 cm/s LVOT diam:     1.80 cm     LV E/e' lateral: 13.2 LV SV:         47 LV SV Index:   29 LVOT Area:     2.54 cm  LV Volumes (MOD) LV vol d, MOD A2C: 85.2 ml LV vol d, MOD A4C: 85.2 ml LV vol s, MOD A2C: 47.3 ml LV vol s, MOD A4C: 46.9 ml LV SV MOD A2C:     37.9 ml LV SV MOD A4C:     85.2 ml LV SV MOD BP:      37.4 ml RIGHT VENTRICLE RV Basal diam:  3.30 cm RV S prime:     11.90 cm/s TAPSE (M-mode): 2.0 cm LEFT ATRIUM             Index       RIGHT ATRIUM           Index LA diam:        2.60 cm 1.64 cm/m  RA Area:     19.50 cm LA Vol (A2C):   42.3 ml 26.60 ml/m RA Volume:   55.70 ml  35.03 ml/m LA Vol (A4C):   34.2 ml 21.51 ml/m LA Biplane Vol: 40.9 ml 25.72 ml/m  AORTIC VALVE AV Area (Vmax):    1.42 cm AV Area (Vmean):   1.50 cm AV Area (VTI):     1.56 cm AV Vmax:           152.00 cm/s AV Vmean:          103.000 cm/s AV VTI:            0.300 m AV Peak Grad:      9.2 mmHg AV Mean Grad:      5.0 mmHg LVOT Vmax:         85.00 cm/s LVOT Vmean:        60.900 cm/s LVOT VTI:          0.184 m LVOT/AV VTI ratio: 0.61  AORTA Ao Root diam: 3.10 cm Ao Asc diam:  2.90 cm MITRAL VALVE                TRICUSPID VALVE MV Area (PHT): 3.72 cm     TR Peak grad:   25.2 mmHg MV Decel Time: 204 msec     TR Vmax:        251.00 cm/s MV E velocity: 80.50 cm/s MV A velocity: 112.00 cm/s  SHUNTS MV E/A ratio:  0.72         Systemic VTI:  0.18 m                             Systemic Diam: 1.80 cm Riley Lam MD Electronically signed by Riley Lam MD Signature Date/Time: 07/12/2020/11:15:37 AM    Final     Patient Profile     JANEA Dunn a 87 y.o.femalewith a history of RA, LBBB,  NICM, HTN, HLD, and severe DJDwho is being seen today for the evaluation of syncopeat the request of Dr. Jacinto Halim.  Assessment & Plan    1.Syncope in the setting of  transient CHB Now s/p St. Jude DDD PPM CXR this am with stable appearing leads and no pneumothorax.  Wound care and arm restrictions reviewed with patient.  Will follow up with [place] in 10-14 days for wound check, then 3 months with Dr. Johney Frame -> Then can consolidate care with Dr. Verl Dicker office.  2. NICM Myoview 08/2018 with LVEF 24% and no ischemia other than a "very small sizes apical defect" Echo this admission with EF 45%  Continue GDMT as tolerated post PPM.OK to resume BB.  EP to see as needed if remains here.   For questions or updates, please contact [facility] Please consult [email or web address] for contact info under Cardiology/STEMI.  Signed, Graciella Freer, PA-C  07/13/2020, 7:36 AM   Device interrogation is reviewed and normal CXR reveals stable leads, no ptx  DC to home with routine wound care and follow-up  Hillis Range MD, [facility] 07/13/2020 4:53 PM

## 2020-07-13 NOTE — TOC Initial Note (Addendum)
Transition of Care [facility]) - Initial/Assessment Note    Patient Details  Name: Pamela Dunn MRN: [id number] Date of Birth: 1931-05-02  Transition of Care [facility]) CM/SW Contact:    Curlene Labrum, RN Phone Number: 07/13/2020, 10:18 AM  Clinical Narrative:                 Case management met with the patient and son at the bedside to discuss transitions of care to home once medically stable.  The patient lives alone and has support through her son, Pamela Dunn who provides assistance with groceries, ADL's and transportation.  The patient has a rolling walker and raised toilet set at home currently.  The patient was recently discharged from [facility] and was happy with their services.  Home Health orders were placed for PT/ aide to be co-signed by the physician.  I spoke with [place], [place] with [facility] ) and the agency is to provide home health services again.  The patient's PCP is [facility] on [place].  CM to follow the patient for transitions of care needs to home - possibly today per nursing staff once medically clear by physician.  Expected Discharge Plan: [place] Barriers to Discharge: No Barriers Identified   Patient Goals and CMS Choice Patient states their goals for this hospitalization and ongoing recovery are:: Discharge to home with family support and home health. CMS Medicare.gov Compare Post Acute Care list provided to:: Patient Choice offered to / list presented to : [place]  Expected Discharge Plan and Services Expected Discharge Plan: [place]   Discharge Planning Services: CM Consult Post Acute Care Choice: [place] arrangements for the past 2 months: [place]: PT,Nurse's Aide [place]: [facility] [employer]) Date [place]: 07/13/20 Time [place]:  [place] Representative spoke with at [place]: [place], [place] with [place] ([facility] Arrangements/Services Living arrangements for the past 2 months: [place] Lives with:: Self Patient language and need for interpreter reviewed:: Yes Do you feel safe going back to the place where you live?: Yes      Need for Family Participation in Patient Care: Yes (Comment) Care giver support system in place?: Yes (comment) Current home services: DME (Patient has rolling walker and raised toilet seat at home.) Criminal Activity/Legal Involvement Pertinent to Current Situation/Hospitalization: No - Comment as needed  Activities of Daily Living      Permission Sought/Granted Permission sought to share information with : Case Manager Permission granted to share information with : Yes, Verbal Permission Granted     Permission granted to share info w AGENCY: [facility] for PT, aide,  Permission granted to share info w Relationship: son, Pamela Dunn - [phone]     Emotional Assessment Appearance:: Appears stated age   Affect (typically observed): Accepting Orientation: : Oriented to Self,Oriented to Place,Oriented to  Time,Oriented to Situation Alcohol / Substance Use: Not Applicable Psych Involvement: No (comment)  Admission diagnosis:  Complete heart block ([place]) [I44.2] Heart block AV complete ([place]) [I44.2] Patient Active Problem List   Diagnosis Date Noted  . Pacemaker Abbott Assurity MRI model [id number] 07/12/2020 07/12/2020  . Encounter for care of pacemaker 07/12/2020  . Heart block  AV complete ([place]) 07/11/2020  . Pressure injury of skin 08/27/2019  . Hypothyroidism 08/23/2019  . Chronic combined systolic and diastolic congestive heart failure ([place]) 08/23/2019  . SBO (small bowel obstruction) ([place]) 08/17/2019  . Multiple pulmonary nodules 06/19/2017  . Metatarsalgia of left foot 07/13/2014  . Hx of adenomatous colonic polyps 09/05/2013  .  Spinal stenosis of lumbar region 09/05/2013  . Lumbar disc disease 09/05/2013  . Hyperlipidemia 10/23/2009  . GERD 10/23/2009  . Rheumatoid arthritis ([place]) 10/23/2009  . ABDOMINAL PAIN, UNSPECIFIED SITE 10/23/2009  . ANEMIA, HX OF 10/23/2009  . PEPTIC ULCER DISEASE, HX OF 10/23/2009   PCP:  Pcp, No Pharmacy:   [id number] [id number]: [phone]: [phone]    Social Determinants of Health (SDOH) Interventions    Readmission Risk Interventions Readmission Risk Prevention Plan 07/13/2020  Transportation Screening Complete  PCP or Specialist Appt within 5-7 Days Complete  Home Care Screening Complete  Medication Review (RN CM) Complete  Some recent data might be hidden

## 2020-07-13 NOTE — Plan of Care (Signed)
  Problem: Education: Goal: Knowledge of cardiac device and self-care will improve Outcome: Adequate for Discharge Goal: Ability to safely manage health related needs after discharge will improve Outcome: Adequate for Discharge Goal: Individualized Educational Video(s) Outcome: Adequate for Discharge   Problem: Cardiac: Goal: Ability to achieve and maintain adequate cardiopulmonary perfusion will improve Outcome: Adequate for Discharge   Problem: Education: Goal: Knowledge of General Education information will improve Description: Including pain rating scale, medication(s)/side effects and non-pharmacologic comfort measures Outcome: Adequate for Discharge   Problem: Health Behavior/Discharge Planning: Goal: Ability to manage health-related needs will improve Outcome: Adequate for Discharge   Problem: Coping: Goal: Level of anxiety will decrease Outcome: Adequate for Discharge   Problem: Pain Managment: Goal: General experience of comfort will improve Outcome: Adequate for Discharge   Problem: Safety: Goal: Ability to remain free from injury will improve Outcome: Adequate for Discharge

## 2020-07-20 ENCOUNTER — Telehealth: Payer: Self-pay

## 2020-07-24 ENCOUNTER — Ambulatory Visit (INDEPENDENT_AMBULATORY_CARE_PROVIDER_SITE_OTHER): Payer: Medicare PPO | Admitting: Emergency Medicine

## 2020-07-24 ENCOUNTER — Other Ambulatory Visit: Payer: Self-pay

## 2020-07-24 DIAGNOSIS — I442 Atrioventricular block, complete: Secondary | ICD-10-CM

## 2020-07-24 LAB — CUP PACEART INCLINIC DEVICE CHECK
Battery Remaining Longevity: 106 mo
Battery Voltage: 3.02 V
Brady Statistic RA Percent Paced: 42 %
Brady Statistic RV Percent Paced: 0.12 %
Date Time Interrogation Session: 20220517103450
Implantable Lead Implant Date: 20220505
Implantable Lead Implant Date: 20220505
Implantable Lead Location: 753859
Implantable Lead Location: 753860
Implantable Pulse Generator Implant Date: 20220505
Lead Channel Impedance Value: 412.5 Ohm
Lead Channel Impedance Value: 512.5 Ohm
Lead Channel Pacing Threshold Amplitude: 0.625 V
Lead Channel Pacing Threshold Amplitude: 0.75 V
Lead Channel Pacing Threshold Pulse Width: 0.5 ms
Lead Channel Pacing Threshold Pulse Width: 0.5 ms
Lead Channel Sensing Intrinsic Amplitude: 0.9 mV
Lead Channel Sensing Intrinsic Amplitude: 11.3 mV
Lead Channel Setting Pacing Amplitude: 0.875
Lead Channel Setting Pacing Amplitude: 3.5 V
Lead Channel Setting Pacing Pulse Width: 0.5 ms
Lead Channel Setting Sensing Sensitivity: 2 mV
Pulse Gen Model: 2272
Pulse Gen Serial Number: 3920272

## 2020-07-24 NOTE — Progress Notes (Signed)
Wound check appointment. Steri-strips removed by patient prior to appointment. Wound without redness or edema however moderate echymosis noted. Incision edges approximated, wound well healed. Normal device function. RV thresholds, sensing, and impedances consistent with implant measurements. RA sensing at 0.9 bipolar, tested 2.0 unipolar however noise present. A auto sense turned on and max sense programmed at 0.4, recommended by industry and programmed on to allow for appropriate sensing safety margin. Device programmed at 3.5V in the A/auto capture in the V programmed on, for extra threshold safety margin until 3 month visit. Histogram distribution appropriate for patient and level of activity. No mode switches or high ventricular rates noted. Patient educated about wound care, arm mobility, lifting restrictions. Remote transmission will be followed by Dr. Jacinto Halim. 91 day follow up with Dr. Johney Frame 10/17/20.

## 2020-07-26 ENCOUNTER — Encounter: Payer: Self-pay | Admitting: Cardiology

## 2020-07-26 ENCOUNTER — Ambulatory Visit: Payer: Medicare PPO | Admitting: Cardiology

## 2020-07-26 ENCOUNTER — Other Ambulatory Visit: Payer: Self-pay

## 2020-07-26 VITALS — BP 124/60 | HR 60 | Temp 98.4°F | Resp 17 | Ht 61.0 in | Wt 137.8 lb

## 2020-07-26 DIAGNOSIS — I5042 Chronic combined systolic (congestive) and diastolic (congestive) heart failure: Secondary | ICD-10-CM

## 2020-07-26 DIAGNOSIS — Z950 Presence of cardiac pacemaker: Secondary | ICD-10-CM

## 2020-07-26 DIAGNOSIS — I442 Atrioventricular block, complete: Secondary | ICD-10-CM

## 2020-07-26 DIAGNOSIS — I10 Essential (primary) hypertension: Secondary | ICD-10-CM

## 2020-07-26 NOTE — Progress Notes (Signed)
Primary Physician/Referring:  [facility], [place]  Patient ID: Pamela Dunn, female    DOB: Aug 07, 1931, 87 y.o.   MRN: [id number]  Chief Complaint  Patient presents with  . Follow-up  . heart block    2 week    HPI: Pamela Dunn  is a 87 y.o. female  with with long-standing history of recurrent rheumatoid arthritis, left bundle branch block, nonischemic cardiomyopathy with negative nuclear stress test and EF 20% in July 2020, hypertension, hyperlipidemia, severe DJD and RA with arthritis and her physical disability due to arthritis and using a cane to walk, also has pain in her hand joints.   She presented to the emergency room with syncope and was found to be in complete heart block on 07/11/2020 and underwent successful dual-chamber pacemaker implantation with Abbott surety MRI dual-chamber pacemaker on 07/12/2020.  She has not had any further episodes of syncope since then.  She now presents for follow-up of congestive heart failure, hypertension SP pacemaker implantation.  She is feeling well and has not had any recurrence of dizziness or syncope and states that she is back to her baseline.  Past Medical History:  Diagnosis Date  . Allergy    seasonal  . Anemia   . Arthritis   . Encounter for care of pacemaker 07/12/2020  . GERD (gastroesophageal reflux disease)   . Heart block AV complete ([place]) 07/11/2020  . Hyperlipidemia   . Hypertension   . Pacemaker Abbott Assurity MRI model [id number] 07/12/2020 07/12/2020    Past Surgical History:  Procedure Laterality Date  . APPENDECTOMY    . BACK SURGERY    . BOWEL RESECTION N/A 08/24/2019   Procedure: SMALL BOWEL RESECTION;  Surgeon: Coralie Keens, MD;  Location: [facility];  Service: General;  Laterality: N/A;  . LAPAROSCOPY N/A 08/24/2019   Procedure: LAPAROSCOPY DIAGNOSTIC;  Surgeon: Coralie Keens, MD;  Location: [facility];  Service: General;  Laterality: N/A;  . LAPAROTOMY N/A 08/24/2019   Procedure: EXPLORATORY LAPAROTOMY;   Surgeon: Coralie Keens, MD;  Location: [facility];  Service: General;  Laterality: N/A;  . PACEMAKER IMPLANT N/A 07/12/2020   Procedure: PACEMAKER IMPLANT;  Surgeon: Thompson Grayer, MD;  Location: [facility];  Service: Cardiovascular;  Laterality: N/A;   Social History   Tobacco Use  . Smoking status: Never Smoker  . Smokeless tobacco: Never Used  Substance Use Topics  . Alcohol use: No   Marital Status: Widowed   Review of Systems  Constitutional: Negative for malaise/fatigue.  Cardiovascular: Positive for dyspnea on exertion (stable). Negative for chest pain and leg swelling.  Musculoskeletal: Positive for arthritis and joint pain.  Gastrointestinal: Negative for melena.   Objective      Vitals with BMI 07/26/2020 07/26/2020 07/13/2020  Height - 5' 1" -  Weight - 137 lbs 13 oz -  BMI - 90.37 -  Systolic 955 831 674  Diastolic 60 88 50  Pulse 60 61 67    Physical Exam Constitutional:      General: She is not in acute distress.    Appearance: She is well-developed.     Comments: Short stature  Cardiovascular:     Rate and Rhythm: Normal rate and regular rhythm.     Pulses: Intact distal pulses.     Heart sounds: No murmur heard. No gallop.      Comments: S1 is normal, S2 is paradoxically split. No JVD. No pedal edema. Pulmonary:     Effort: Pulmonary effort is normal.  Breath sounds: Normal breath sounds.  Abdominal:     General: Bowel sounds are normal.     Palpations: Abdomen is soft.  Musculoskeletal:        General: Deformity (burnt out RA in hands) present. Normal range of motion.    Radiology: No results found.  Laboratory examination:   CMP Latest Ref Rng & Units 07/11/2020 09/02/2019 09/01/2019  Glucose 70 - 99 mg/dL 112(H) 106(H) 91  BUN 8 - 23 mg/dL 30(H) 29(H) 29(H)  Creatinine 0.44 - 1.00 mg/dL 1.15(H) 1.23(H) 1.49(H)  Sodium 135 - 145 mmol/L 137 136 136  Potassium 3.5 - 5.1 mmol/L 4.0 3.7 3.6  Chloride 98 - 111 mmol/L 104 103 103  CO2 22 -  32 mmol/L _0 Calcium 8.9 - 10.3 mg/dL 9.0 7.9(L) 7.4(L)  Total Protein 6.5 - 8.1 g/dL - - -  Total Bilirubin 0.3 - 1.2 mg/dL - - -  Alkaline Phos 38 - 126 U/L - - -  AST 15 - 41 U/L - - -  ALT 0 - 44 U/L - - -   CBC Latest Ref Rng & Units 07/11/2020 09/02/2019 09/01/2019  WBC 4.0 - 10.5 K/uL 12.9(H) 17.6(H) 21.4(H)  Hemoglobin 12.0 - 15.0 g/dL 11.9(L) 9.0(L) 8.6(L)  Hematocrit 36.0 - 46.0 % 38.0 28.2(L) 26.7(L)  Platelets 150 - 400 K/uL 280 360 331    External labs:   Labs 04/02/2020:  Hb 12.6/HCT 39.3, platelets 306, normal indicis.  Serum glucose 94 mg, BUN 24, creatinine 1.17, EGFR 42 mL, potassium 4.9, sodium 139.  LFTs normal.  Urine analysis normal.  Labs 02/20/2020:  Total cholesterol 180, triglycerides 157, HDL 49, LDL 100.  TSH normal.  Cholesterol, total 235.000 M11/18/2020 HDL40.000  11/15/2019 LDL-C82.000 11/15/2019 Triglycerides183.000 11/15/2019  Hemoglobin9.000 g/dL6/25/2021 Platelets360.000 K/09/02/2019  Creatinine, Serum1.230 mg/09/02/2019 Potassium3.700 mm6/25/2021 MagnesiumN/D ALT (SGPT)18.000 U/L6/23/2021  TSH2.3309/09/2019  Labs 10/25/2018: Serum glucose 76, BUN 27, creatinine 1.08, eGFR 47 mL, potassium 4.6, HB 11.8, HCT 36.5, platelets 288.  Normal indicis.  Total cholesterol 205, triglycerides 224, HDL 49, LDL 111. TSH minimally elevated at 5.990.  PCP 04/26/2018:  Glucose 81, BUN/Cr 21/1.09, eGFR 47, Na/K 144/4.8, AST 21, ALT 8. Alk Phos 50. Rest of CMP normal.  WBC 13.3, RBC 3.71, H/H 11.3/35.3, MCV 96, Platelets 347,  Rest of CBC normal. Total cholesterol 202 triglycerides 86, HDL 57, LDL 128.   Medications   Current Outpatient Medications on File Prior to Visit  Medication Sig Dispense Refill  . Aspirin-Caffeine 500-32.5 MG TABS Take 1 tablet by mouth daily as needed (pain).     [name] Kitchen BIDIL 20-37.5 MG tablet TAKE 1 TABLET BY MOUTH THREE TIMES A DAY 270 tablet 2  . cholecalciferol (VITAMIN D) 1000 UNITS tablet Take 1,000 Units by mouth daily.      . Coenzyme Q10 (COQ10) 200 MG CAPS Take 200 mg by mouth daily.    . Diclofenac Sodium (DICLO GEL) 1 % KIT Apply 1 application topically as needed.    . ezetimibe (ZETIA) 10 MG tablet Take 10 mg by mouth daily.     . folic acid (FOLVITE) 1 MG tablet Take 1 mg by mouth daily.     [name] Kitchen levothyroxine (SYNTHROID) 25 MCG tablet Take 25 mcg by mouth daily.     . Multiple Vitamins-Minerals (EYE VITAMINS & MINERALS PO)     . mupirocin nasal ointment (BACTROBAN) 2 % Place 1 application into the nose 2 (two) times daily. Use one-half of tube in each nostril twice daily for  five (5) days. After application, press sides of nose together and gently massage.    . predniSONE (DELTASONE) 5 MG tablet Take 5 mg by mouth daily.    . metoprolol succinate (TOPROL-XL) 50 MG 24 hr tablet TAKE 1 TABLET (50 MG TOTAL) BY MOUTH DAILY. TAKE WITH OR IMMEDIATELY FOLLOWING A MEAL. (Patient taking differently: Take 50 mg by mouth every evening. Take with or immediately following a meal.) 90 tablet 1   No current facility-administered medications on file prior to visit.    Cardiac Studies:   Lexiscan Myoview Stress Test 09/06/2018: Resting EKG demonstrates normal sinus rhythm.  Left bundle branch block.  Stress EKG is non-diagnostic as it's a pharmacologic stress test. Nuclear images reveal left ventricle to be mildly dilated both in rest and stress images with end-diastolic volume of 564 mL.  The perfusion imaging study demonstrates a moderate sized inferior and inferolateral decreased uptake suggestive of soft tissue attenuation.  In addition there is a very small sized apical septal defect suggestive of ischemia. The left ventricle systolic function calculated by QGS was 24% with global hypokinesis. This represents a high risk study in view of low EF, clinical correlation recommended. Findings may represent non ischemic dilated cardiomyopathy.  Nocturnal oximetry 12/02/2018: SPO2 <88% 23 minutes, <89% 37 minutes, highest SPO2  99%, lowest SPO2 69%.  Oxygen desaturation events 3% (316 events).  Patient prescribed nocturnal oxygen supplementation. Now off O2 due to resolution of fatigue.  Echocardiogram 07/12/2020: 1. Left ventricular ejection fraction, by estimation, is 45%. The left ventricle has mildly decreased function. There is moderate concentric left ventricular hypertrophy. Left ventricular diastolic parameters are consistent with Grade I diastolic  dysfunction (impaired relaxation). Elevated left atrial pressure. 2. Right ventricular systolic function is normal. The right ventricular size is normal. 3. Right atrial size was mildly dilated. 4. The mitral valve is grossly normal. No evidence of mitral valve regurgitation. 5. Tricuspid valve regurgitation is mild to moderate. 6. The aortic valve is tricuspid. Aortic valve regurgitation is not visualized. No aortic stenosis is present. 7. The inferior vena cava is normal in size with greater than 50% respiratory variability, suggesting right atrial pressure of 3 mmHg. No significant change from 06/16/2019    EKG:   EKG 07/26/2020: Atrially paced and ventricularly sensed rhythm at rate of 60 bpm, left bundle branch block.  No further analysis.   EKG 06/11/2020: Normal sinus rhythm at rate of 57 bpm, left atrial enlargement, left bundle branch block.  No further analysis.   No significant change from EKG 12/13/2019   Assessment     ICD-10-CM   1. Heart block AV complete (HCC)  I44.2   2. Chronic combined systolic and diastolic congestive heart failure (HCC)  I50.42   3. Pacemaker Abbott Assurity MRI model [id number] 07/12/2020  Z95.0   4. Primary hypertension  I10 EKG 12-Lead   No orders of the defined types were placed in this encounter.  Medications Discontinued During This Encounter  Medication Reason  . RA Vitamin E-Vit A & D 20000 units CREA Error    Recommendations:    Pamela Dunn  is a 87 y.o. female  with with long-standing history of  recurrent rheumatoid arthritis, left bundle branch block, nonischemic cardiomyopathy with negative nuclear stress test and EF 20% in July 2020, hypertension, hyperlipidemia, severe DJD and RA with arthritis and her physical disability due to arthritis and using a cane to walk, also has pain in her hand joints.  She presented to the emergency  room with syncope and was found to be in complete heart block on 07/11/2020 and underwent successful dual-chamber pacemaker implantation with Abbott surety MRI dual-chamber pacemaker on 07/12/2020.  She has not had any further episodes of syncope since then.  She is back on appropriate medical therapy including beta-blockers which has helped her with improving her LVEF and congestive heart failure and also BiDil.  She is 100% atrially paced today on EKG.  Blood pressures well controlled.  I will see her back in 3 months for follow-up for in person pacemaker check and also for office visit to follow-up on CHF.  She has been scheduled for remote pacemaker monitoring in our clinic.    Adrian Prows, MD, [facility] 07/26/2020, 12:49 PM Office: [phone]

## 2020-08-07 ENCOUNTER — Telehealth: Payer: Self-pay | Admitting: Cardiology

## 2020-08-07 DIAGNOSIS — I442 Atrioventricular block, complete: Secondary | ICD-10-CM

## 2020-08-07 DIAGNOSIS — I428 Other cardiomyopathies: Secondary | ICD-10-CM

## 2020-08-07 DIAGNOSIS — I5032 Chronic diastolic (congestive) heart failure: Secondary | ICD-10-CM

## 2020-08-07 DIAGNOSIS — M0579 Rheumatoid arthritis with rheumatoid factor of multiple sites without organ or systems involvement: Secondary | ICD-10-CM

## 2020-08-07 DIAGNOSIS — M48061 Spinal stenosis, lumbar region without neurogenic claudication: Secondary | ICD-10-CM

## 2020-08-07 NOTE — Telephone Encounter (Signed)
done

## 2020-08-07 NOTE — Telephone Encounter (Signed)
ICD-10-CM   1. Heart block AV complete (HCC)  I44.2   2. Spinal stenosis of lumbar region without neurogenic claudication  M48.061   3. Rheumatoid arthritis involving multiple sites with positive rheumatoid factor (HCC)  M05.79   4. Chronic diastolic heart failure (HCC)  U23.53   5. Non-ischemic cardiomyopathy (HCC)  I42.8    I have reviewed home health plan of care and signed off on the order set for 07/19/2020 through 09/16/2020.   Yates Decamp, MD, [facility] 08/07/2020, 4:10 PM Office: [phone] Fax: [phone] Pager: [phone]

## 2020-09-06 ENCOUNTER — Other Ambulatory Visit: Payer: Medicare PPO

## 2020-09-12 ENCOUNTER — Ambulatory Visit: Payer: Medicare PPO | Admitting: Cardiology

## 2020-09-14 ENCOUNTER — Telehealth: Payer: Self-pay

## 2020-09-14 NOTE — Telephone Encounter (Signed)
Occupational therapy request from eric with home health [facility] [phone]

## 2020-09-14 NOTE — Telephone Encounter (Signed)
Lvm for eric stating ok for occupational therapy

## 2020-09-14 NOTE — Telephone Encounter (Signed)
Sure, that is fine with me.

## 2020-09-21 ENCOUNTER — Telehealth: Payer: Self-pay

## 2020-09-21 NOTE — Telephone Encounter (Signed)
Spoke to the patient, she stated that the blood pressure was 116/70 mmHg, she is not having any dizziness and asymptomatic.  She is feeling tired, white count is elevated, she is being told to get urine sample for possible UTI.  I reassured her, unless she is having dizzy spells of the blood pressure is <110/70 mmHg, to call us back.

## 2020-09-27 ENCOUNTER — Telehealth: Payer: Self-pay | Admitting: Cardiology

## 2020-09-27 DIAGNOSIS — I5032 Chronic diastolic (congestive) heart failure: Secondary | ICD-10-CM

## 2020-09-27 DIAGNOSIS — M48061 Spinal stenosis, lumbar region without neurogenic claudication: Secondary | ICD-10-CM

## 2020-09-27 DIAGNOSIS — I442 Atrioventricular block, complete: Secondary | ICD-10-CM

## 2020-09-27 DIAGNOSIS — M0579 Rheumatoid arthritis with rheumatoid factor of multiple sites without organ or systems involvement: Secondary | ICD-10-CM

## 2020-09-27 NOTE — Telephone Encounter (Signed)
I have reviewed home health plans of care, signed off on the orders and appropriate.    ICD-10-CM   1. Heart block AV complete (HCC)  I44.2     2. Spinal stenosis of lumbar region without neurogenic claudication  M48.061     3. Chronic diastolic heart failure (HCC)  B84.66     4. Rheumatoid arthritis involving multiple sites with positive rheumatoid factor (HCC)  M05.79       Yates Decamp, MD, [facility] 09/27/2020, 12:10 PM Office: [phone] Fax: [phone] Pager: [phone]

## 2020-10-16 ENCOUNTER — Other Ambulatory Visit: Payer: Self-pay | Admitting: Cardiology

## 2020-10-16 DIAGNOSIS — I5022 Chronic systolic (congestive) heart failure: Secondary | ICD-10-CM

## 2020-10-17 ENCOUNTER — Encounter: Payer: Self-pay | Admitting: Internal Medicine

## 2020-10-17 ENCOUNTER — Other Ambulatory Visit: Payer: Self-pay

## 2020-10-17 ENCOUNTER — Ambulatory Visit (INDEPENDENT_AMBULATORY_CARE_PROVIDER_SITE_OTHER): Payer: Medicare PPO | Admitting: Internal Medicine

## 2020-10-17 ENCOUNTER — Telehealth: Payer: Self-pay

## 2020-10-17 VITALS — BP 106/50 | HR 66 | Ht 61.0 in | Wt 138.2 lb

## 2020-10-17 DIAGNOSIS — I442 Atrioventricular block, complete: Secondary | ICD-10-CM | POA: Diagnosis not present

## 2020-10-17 MED ORDER — METOPROLOL SUCCINATE ER 25 MG PO TB24: 25.0000 mg | ORAL_TABLET | Freq: Every day | ORAL | 3 refills | Status: DC

## 2020-10-17 NOTE — Patient Instructions (Signed)
Medication Instructions:  Reduce Metoprolol Succinate to 25 mg daily Your physician recommends that you continue on your current medications as directed. Please refer to the Current Medication list given to you today.  Labwork: None ordered.  Testing/Procedures: None ordered.  Follow-Up: Your physician wants you to follow-up in: As needed with Hillis Range, MD    Any Other Special Instructions Will Be Listed Below (If Applicable).  If you need a refill on your cardiac medications before your next appointment, please call your pharmacy.

## 2020-10-17 NOTE — Telephone Encounter (Signed)
Called and spoke to pt, patient said yes.

## 2020-10-17 NOTE — Telephone Encounter (Signed)
Ask patient if she still needs help

## 2020-10-17 NOTE — Progress Notes (Signed)
PCP: [facility], [facility] Primary Cardiologist: Dr Einar Gip Primary EP:  Dr Rayann Heman  Pamela Dunn is a 87 y.o. female who presents today for routine electrophysiology followup.  Since her PPM implant, the patient reports doing very well.  Today, she denies symptoms of palpitations, chest pain, shortness of breath,  lower extremity edema, dizziness, presyncope, or syncope.  The patient is otherwise without complaint today.   Past Medical History:  Diagnosis Date   Allergy    seasonal   Anemia    Arthritis    Encounter for care of pacemaker 07/12/2020   GERD (gastroesophageal reflux disease)    Heart block AV complete ([place]) 07/11/2020   Hyperlipidemia    Hypertension    Pacemaker Abbott Assurity MRI model [id number] 07/12/2020 07/12/2020   Past Surgical History:  Procedure Laterality Date   APPENDECTOMY     BACK SURGERY     BOWEL RESECTION N/A 08/24/2019   Procedure: SMALL BOWEL RESECTION;  Surgeon: Coralie Keens, MD;  Location: [facility];  Service: General;  Laterality: N/A;   LAPAROSCOPY N/A 08/24/2019   Procedure: LAPAROSCOPY DIAGNOSTIC;  Surgeon: Coralie Keens, MD;  Location: [facility];  Service: General;  Laterality: N/A;   LAPAROTOMY N/A 08/24/2019   Procedure: EXPLORATORY LAPAROTOMY;  Surgeon: Coralie Keens, MD;  Location: [facility];  Service: General;  Laterality: N/A;   PACEMAKER IMPLANT N/A 07/12/2020   Procedure: PACEMAKER IMPLANT;  Surgeon: Thompson Grayer, MD;  Location: [place] [facility];  Service: Cardiovascular;  Laterality: N/A;    ROS- all systems are reviewed and negative except as per HPI above  Current Outpatient Medications  Medication Sig Dispense Refill   Aspirin-Caffeine 500-32.5 MG TABS Take 1 tablet by mouth daily as needed (pain).      BIDIL 20-37.5 MG tablet TAKE 1 TABLET BY MOUTH THREE TIMES A DAY 270 tablet 2   cholecalciferol (VITAMIN D) 1000 UNITS tablet Take 1,000 Units by mouth daily.      Coenzyme Q10 (COQ10) 200 MG CAPS Take 200 mg by mouth  daily.     Diclofenac Sodium (DICLO GEL) 1 % KIT Apply 1 application topically as needed.     ezetimibe (ZETIA) 10 MG tablet Take 10 mg by mouth daily.      folic acid (FOLVITE) 1 MG tablet Take 1 mg by mouth daily.      levothyroxine (SYNTHROID) 25 MCG tablet Take 25 mcg by mouth daily.      metoprolol succinate (TOPROL-XL) 50 MG 24 hr tablet TAKE 1 TABLET BY MOUTH DAILY. TAKE WITH OR IMMEDIATELY FOLLOWING A MEAL. 90 tablet 1   Multiple Vitamins-Minerals (EYE VITAMINS & MINERALS PO)      mupirocin nasal ointment (BACTROBAN) 2 % Place 1 application into the nose 2 (two) times daily. Use one-half of tube in each nostril twice daily for five (5) days. After application, press sides of nose together and gently massage.     predniSONE (DELTASONE) 5 MG tablet Take 5 mg by mouth daily.     No current facility-administered medications for this visit.    Physical Exam: Vitals:   10/17/20 1403  BP: (!) 106/50  Pulse: 66  SpO2: 94%  Weight: 138 lb 3.2 oz (62.7 kg)  Height: 5' 1" (1.549 m)    GEN- The patient is elderly and frail  appearing, alert and oriented x 3 today.   Head- normocephalic, atraumatic Eyes-  Sclera clear, conjunctiva pink Ears- hearing intact Oropharynx- clear Lungs- Clear to ausculation bilaterally, normal work of  breathing Chest- pacemaker pocket is well healed Heart- Regular rate and rhythm, no murmurs, rubs or gallops, PMI not laterally displaced GI- soft, NT, ND, + BS Extremities- no clubbing, cyanosis, or edema  Pacemaker interrogation- reviewed in detail today,  See PACEART report  ekg tracing ordered today is personally reviewed and shows sinus with IVCD  Assessment and Plan:  1. Symptomatic transient complete heart block with syncope Normal pacemaker function See Pace Art report No changes today she is not device dependant today  2. Chronic systolic dysfunction Euvolemic No changes  She will have her pacemaker followed with Dr Einar Gip and I will see  as needed going forward  Thompson Grayer MD, [facility] 10/17/2020 2:14 PM

## 2020-10-17 NOTE — Telephone Encounter (Signed)
Pamela Dunn from [facility] called requesting verbal orders for an extension of home health for pt. The would like for it to be once a week for the duration of 4 weeks.   Contact number for [facility]: [phone]

## 2020-10-18 DIAGNOSIS — M79643 Pain in unspecified hand: Secondary | ICD-10-CM | POA: Diagnosis not present

## 2020-10-18 DIAGNOSIS — M810 Age-related osteoporosis without current pathological fracture: Secondary | ICD-10-CM | POA: Diagnosis not present

## 2020-10-18 DIAGNOSIS — Z79899 Other long term (current) drug therapy: Secondary | ICD-10-CM | POA: Diagnosis not present

## 2020-10-18 DIAGNOSIS — M0579 Rheumatoid arthritis with rheumatoid factor of multiple sites without organ or systems involvement: Secondary | ICD-10-CM | POA: Diagnosis not present

## 2020-10-18 DIAGNOSIS — M7989 Other specified soft tissue disorders: Secondary | ICD-10-CM | POA: Diagnosis not present

## 2020-10-18 DIAGNOSIS — M109 Gout, unspecified: Secondary | ICD-10-CM | POA: Diagnosis not present

## 2020-10-18 DIAGNOSIS — M1990 Unspecified osteoarthritis, unspecified site: Secondary | ICD-10-CM | POA: Diagnosis not present

## 2020-10-26 ENCOUNTER — Ambulatory Visit: Payer: Medicare PPO | Admitting: Cardiology

## 2020-10-29 ENCOUNTER — Telehealth: Payer: Self-pay | Admitting: Cardiology

## 2020-10-29 NOTE — Telephone Encounter (Signed)
I have already done that

## 2020-10-29 NOTE — Telephone Encounter (Signed)
Gracee calling on behalf of Pt wondering if we can extend the approval for home health PT to once per week for 3 weeks. Gracee's # is [phone].

## 2020-10-29 NOTE — Telephone Encounter (Signed)
Gracee with [facility] called again and was wondering if we can extend the approval for home health PT to once per week for 3 weeks. Called PT on the 10th and pt stated she still needed assistance.

## 2020-10-30 NOTE — Telephone Encounter (Signed)
Called and informed Gracee with [facility].

## 2020-10-30 NOTE — Telephone Encounter (Signed)
Called pt, no answer. Left vm requesting call back?

## 2020-11-05 ENCOUNTER — Encounter: Payer: Self-pay | Admitting: Cardiology

## 2020-11-05 ENCOUNTER — Ambulatory Visit: Payer: Medicare PPO | Admitting: Cardiology

## 2020-11-05 ENCOUNTER — Other Ambulatory Visit: Payer: Self-pay

## 2020-11-05 VITALS — BP 108/50 | HR 72 | Temp 97.8°F | Resp 17 | Ht 61.0 in | Wt 136.2 lb

## 2020-11-05 DIAGNOSIS — I5032 Chronic diastolic (congestive) heart failure: Secondary | ICD-10-CM | POA: Diagnosis not present

## 2020-11-05 DIAGNOSIS — Z950 Presence of cardiac pacemaker: Secondary | ICD-10-CM | POA: Diagnosis not present

## 2020-11-05 DIAGNOSIS — I442 Atrioventricular block, complete: Secondary | ICD-10-CM

## 2020-11-05 DIAGNOSIS — I10 Essential (primary) hypertension: Secondary | ICD-10-CM

## 2020-11-05 NOTE — Progress Notes (Signed)
Primary Physician/Referring:  [facility] [place]  Patient ID: Pamela Dunn, female    DOB: 1931-11-10, 87 y.o.   MRN: [id number]  Chief Complaint  Patient presents with   Follow-up    3 month   Congestive Heart Failure   Hypertension    HPI: Pamela Dunn  is a 87 y.o. female  female  with with long-standing history of recurrent rheumatoid arthritis, left bundle branch block, nonischemic cardiomyopathy with negative nuclear stress test and EF 20% in July 2020, EF improved to 45% by medical therapy, hypertension, hyperlipidemia, syncope and complete heart block on 07/11/2020 SP Abbott dual-chamber pacemaker implantation on 07/12/2020, severe DJD and RA with arthritis and her physical disability due to arthritis and using a cane to walk, also has pain in her hand joints.  She was evaluated by Dr. Thompson Grayer recently, due to fatigue, he reduced the dose of metoprolol succinate from 50 to 25 mg daily. She now presents for 3 month follow-up of congestive heart failure, hypertension SP pacemaker implantation.  She is feeling well and has not had any recurrence of dizziness or syncope and states that she is back to her baseline.  Past Medical History:  Diagnosis Date   Allergy    seasonal   Anemia    Arthritis    Encounter for care of pacemaker 07/12/2020   GERD (gastroesophageal reflux disease)    Heart block AV complete ([place]) 07/11/2020   Hyperlipidemia    Hypertension    Pacemaker Abbott Assurity MRI model [id number] 07/12/2020 07/12/2020    Past Surgical History:  Procedure Laterality Date   APPENDECTOMY     BACK SURGERY     BOWEL RESECTION N/A 08/24/2019   Procedure: SMALL BOWEL RESECTION;  Surgeon: Coralie Keens, MD;  Location: [facility];  Service: General;  Laterality: N/A;   LAPAROSCOPY N/A 08/24/2019   Procedure: LAPAROSCOPY DIAGNOSTIC;  Surgeon: Coralie Keens, MD;  Location: [facility];  Service: General;  Laterality: N/A;   LAPAROTOMY N/A 08/24/2019   Procedure:  EXPLORATORY LAPAROTOMY;  Surgeon: Coralie Keens, MD;  Location: [facility];  Service: General;  Laterality: N/A;   PACEMAKER IMPLANT N/A 07/12/2020   Procedure: PACEMAKER IMPLANT;  Surgeon: Thompson Grayer, MD;  Location: [facility] [facility];  Service: Cardiovascular;  Laterality: N/A;   Social History   Tobacco Use   Smoking status: Never   Smokeless tobacco: Never  Substance Use Topics   Alcohol use: No   Marital Status: Widowed   Review of Systems  Constitutional: Negative for malaise/fatigue.  Cardiovascular:  Positive for dyspnea on exertion (stable). Negative for chest pain and leg swelling.  Musculoskeletal:  Positive for arthritis and joint pain.  Gastrointestinal:  Negative for melena.  Objective      Vitals with BMI 11/05/2020 11/05/2020 10/17/2020  Height - 5' 1"  5' 1"   Weight - 136 lbs 3 oz 138 lbs 3 oz  BMI - 16.01 09.32  Systolic 355 732 202  Diastolic 50 58 50  Pulse 72 75 66    Physical Exam Constitutional:      General: She is not in acute distress.    Appearance: She is well-developed.     Comments: Short stature  Cardiovascular:     Rate and Rhythm: Normal rate and regular rhythm.     Pulses: Intact distal pulses.     Heart sounds: No murmur heard.   No gallop.     Comments: S1 is normal, S2 is paradoxically split. No JVD. No pedal edema.  Pulmonary:     Effort: Pulmonary effort is normal.     Breath sounds: Normal breath sounds.  Abdominal:     General: Bowel sounds are normal.     Palpations: Abdomen is soft.  Musculoskeletal:        General: Deformity (burnt out RA in hands) present. Normal range of motion.   Radiology: No results found.  Laboratory examination:   CMP Latest Ref Rng & Units 07/11/2020 09/02/2019 09/01/2019  Glucose 70 - 99 mg/dL 112(H) 106(H) 91  BUN 8 - 23 mg/dL 30(H) 29(H) 29(H)  Creatinine 0.44 - 1.00 mg/dL 1.15(H) 1.23(H) 1.49(H)  Sodium 135 - 145 mmol/L 137 136 136  Potassium 3.5 - 5.1 mmol/L 4.0 3.7 3.6  Chloride 98 - 111  mmol/L 104 103 103  CO2 22 - 32 mmol/L 23 22 26   Calcium 8.9 - 10.3 mg/dL 9.0 7.9(L) 7.4(L)  Total Protein 6.5 - 8.1 g/dL - - -  Total Bilirubin 0.3 - 1.2 mg/dL - - -  Alkaline Phos 38 - 126 U/L - - -  AST 15 - 41 U/L - - -  ALT 0 - 44 U/L - - -   CBC Latest Ref Rng & Units 07/11/2020 09/02/2019 09/01/2019  WBC 4.0 - 10.5 K/uL 12.9(H) 17.6(H) 21.4(H)  Hemoglobin 12.0 - 15.0 g/dL 11.9(L) 9.0(L) 8.6(L)  Hematocrit 36.0 - 46.0 % 38.0 28.2(L) 26.7(L)  Platelets 150 - 400 K/uL 280 360 331    External labs:  Labs 10/18/2020:  Sodium 141, potassium 4.7, chloride 104, serum glucose 105 mg, BUN 27, creatinine 1.09, EGFR 46 mL.  Hb 11.9/HCT 36.6, platelets 298.  Normal indicis.  A1c 5.4%.  TSH 4.75.  Total cholesterol 163, triglycerides 102, HDL 49, LDL 94.  Labs are stable from 2020. Medications   Current Outpatient Medications on File Prior to Visit  Medication Sig Dispense Refill   Aspirin-Caffeine 500-32.5 MG TABS Take 1 tablet by mouth daily as needed (pain).      BIDIL 20-37.5 MG tablet TAKE 1 TABLET BY MOUTH THREE TIMES A DAY 270 tablet 2   cholecalciferol (VITAMIN D) 1000 UNITS tablet Take 1,000 Units by mouth daily.      Coenzyme Q10 (COQ10) 200 MG CAPS Take 200 mg by mouth daily.     ezetimibe (ZETIA) 10 MG tablet Take 10 mg by mouth daily.      folic acid (FOLVITE) 1 MG tablet Take 1 mg by mouth daily.      leflunomide (ARAVA) 20 MG tablet Take 1 tablet by mouth daily.     levothyroxine (SYNTHROID) 25 MCG tablet Take 25 mcg by mouth daily.      metoprolol succinate (TOPROL XL) 25 MG 24 hr tablet Take 1 tablet (25 mg total) by mouth daily. 90 tablet 3   Multiple Vitamins-Minerals (EYE VITAMINS & MINERALS PO)      mupirocin nasal ointment (BACTROBAN) 2 % Place 1 application into the nose 2 (two) times daily. Use one-half of tube in each nostril twice daily for five (5) days. After application, press sides of nose together and gently massage.     predniSONE (DELTASONE) 5 MG tablet  Take 5 mg by mouth daily.     No current facility-administered medications on file prior to visit.    Cardiac Studies:   Lexiscan Myoview Stress Test 09/06/2018: Resting EKG demonstrates normal sinus rhythm.  Left bundle branch block.  Stress EKG is non-diagnostic as it's a pharmacologic stress test. Nuclear images reveal left ventricle to be mildly  dilated both in rest and stress images with end-diastolic volume of 403 mL.  The perfusion imaging study demonstrates a moderate sized inferior and inferolateral decreased uptake suggestive of soft tissue attenuation.  In addition there is a very small sized apical septal defect suggestive of ischemia. The left ventricle systolic function calculated by QGS was 24% with global hypokinesis. This represents a high risk study in view of low EF, clinical correlation recommended. Findings may represent non ischemic dilated cardiomyopathy.  Nocturnal oximetry 12/02/2018: SPO2 <88% 23 minutes, <89% 37 minutes, highest SPO2 99%, lowest SPO2 69%.  Oxygen desaturation events 3% (316 events).  Patient prescribed nocturnal oxygen supplementation. Now off O2 due to resolution of fatigue.  Echocardiogram 07/12/2020:  1. Left ventricular ejection fraction, by estimation, is 45%. The left ventricle has mildly decreased function. There is moderate concentric left ventricular hypertrophy. Left ventricular diastolic parameters are consistent with Grade I diastolic  dysfunction (impaired relaxation). Elevated left atrial pressure.  2. Right ventricular systolic function is normal. The right ventricular size is normal.  3. Right atrial size was mildly dilated.  4. The mitral valve is grossly normal. No evidence of mitral valve regurgitation.  5. Tricuspid valve regurgitation is mild to moderate.  6. The aortic valve is tricuspid. Aortic valve regurgitation is not visualized. No aortic stenosis is present.  7. The inferior vena cava is normal in size with greater than 50%  respiratory variability, suggesting right atrial pressure of 3 mmHg. No significant change from 06/16/2019    EKG:   EKG 07/26/2020: Atrially paced and ventricularly sensed rhythm at rate of 60 bpm, left bundle branch block.  No further analysis.   EKG 06/11/2020: Normal sinus rhythm at rate of 57 bpm, left atrial enlargement, left bundle branch block.  No further analysis.   No significant change from EKG 12/13/2019   Assessment     ICD-10-CM   1. Heart block AV complete (HCC)  I44.2     2. Pacemaker Abbott Assurity MRI model [id number] 07/12/2020  Z95.0     3. Primary hypertension  I10     4. Chronic diastolic heart failure (HCC)  I50.32      No orders of the defined types were placed in this encounter.  Medications Discontinued During This Encounter  Medication Reason   Diclofenac Sodium (DICLO GEL) 1 % KIT Error    Recommendations:    Pamela Dunn  is a 87 y.o. female  with with long-standing history of recurrent rheumatoid arthritis, left bundle branch block, nonischemic cardiomyopathy with negative nuclear stress test and EF 20% in July 2020, EF improved to 45% by medical therapy, hypertension, hyperlipidemia, syncope and complete heart block on 07/11/2020 SP Abbott dual-chamber pacemaker implantation on 07/12/2020, severe DJD and RA with arthritis and her physical disability due to arthritis and using a cane to walk, also has pain in her hand joints.  She was evaluated by Dr. Thompson Grayer recently, due to fatigue, he reduced the dose of metoprolol succinate from 50 to 25 mg daily.  Continue the same, blood pressure is very well controlled.  She is presently on aspirin that was started by rheumatology.  Advised the patient that from cardiac standpoint there is no indication for aspirin and it is left to her rheumatologist whether he would like to continue the same.  With regard to chronic diastolic heart failure and cardiomyopathy, she is presently tolerating BiDil as well and there is  no clinical evidence of heart failure today.  I will see  him back in 6 months for follow-up.  Her pacemaker has been released to our clinic, I will continue remote monitoring.  I reviewed her external labs, lipids under excellent control, stage III chronic kidney disease is stable, CBC has remained stable.    Adrian Prows, MD, [facility] 11/05/2020, 2:27 PM Office: [phone]

## 2020-11-21 DIAGNOSIS — R350 Frequency of micturition: Secondary | ICD-10-CM | POA: Diagnosis not present

## 2020-11-21 DIAGNOSIS — E039 Hypothyroidism, unspecified: Secondary | ICD-10-CM | POA: Diagnosis not present

## 2020-11-21 DIAGNOSIS — I10 Essential (primary) hypertension: Secondary | ICD-10-CM | POA: Diagnosis not present

## 2020-11-21 DIAGNOSIS — E785 Hyperlipidemia, unspecified: Secondary | ICD-10-CM | POA: Diagnosis not present

## 2020-11-23 DIAGNOSIS — I10 Essential (primary) hypertension: Secondary | ICD-10-CM | POA: Diagnosis not present

## 2020-11-23 DIAGNOSIS — R350 Frequency of micturition: Secondary | ICD-10-CM | POA: Diagnosis not present

## 2020-11-23 DIAGNOSIS — E039 Hypothyroidism, unspecified: Secondary | ICD-10-CM | POA: Diagnosis not present

## 2020-11-23 DIAGNOSIS — E785 Hyperlipidemia, unspecified: Secondary | ICD-10-CM | POA: Diagnosis not present

## 2020-11-28 DIAGNOSIS — M0579 Rheumatoid arthritis with rheumatoid factor of multiple sites without organ or systems involvement: Secondary | ICD-10-CM | POA: Diagnosis not present

## 2020-11-28 DIAGNOSIS — E785 Hyperlipidemia, unspecified: Secondary | ICD-10-CM | POA: Diagnosis not present

## 2020-11-28 DIAGNOSIS — Z950 Presence of cardiac pacemaker: Secondary | ICD-10-CM | POA: Diagnosis not present

## 2020-11-28 DIAGNOSIS — I429 Cardiomyopathy, unspecified: Secondary | ICD-10-CM | POA: Diagnosis not present

## 2020-11-28 DIAGNOSIS — E039 Hypothyroidism, unspecified: Secondary | ICD-10-CM | POA: Diagnosis not present

## 2020-11-28 DIAGNOSIS — N3000 Acute cystitis without hematuria: Secondary | ICD-10-CM | POA: Diagnosis not present

## 2021-01-09 DIAGNOSIS — I442 Atrioventricular block, complete: Secondary | ICD-10-CM | POA: Diagnosis not present

## 2021-01-09 DIAGNOSIS — Z45018 Encounter for adjustment and management of other part of cardiac pacemaker: Secondary | ICD-10-CM | POA: Diagnosis not present

## 2021-01-21 DIAGNOSIS — M0579 Rheumatoid arthritis with rheumatoid factor of multiple sites without organ or systems involvement: Secondary | ICD-10-CM | POA: Diagnosis not present

## 2021-01-21 DIAGNOSIS — M1990 Unspecified osteoarthritis, unspecified site: Secondary | ICD-10-CM | POA: Diagnosis not present

## 2021-01-21 DIAGNOSIS — M109 Gout, unspecified: Secondary | ICD-10-CM | POA: Diagnosis not present

## 2021-01-21 DIAGNOSIS — Z79899 Other long term (current) drug therapy: Secondary | ICD-10-CM | POA: Diagnosis not present

## 2021-01-21 DIAGNOSIS — M810 Age-related osteoporosis without current pathological fracture: Secondary | ICD-10-CM | POA: Diagnosis not present

## 2021-01-21 DIAGNOSIS — M79643 Pain in unspecified hand: Secondary | ICD-10-CM | POA: Diagnosis not present

## 2021-01-21 DIAGNOSIS — Z23 Encounter for immunization: Secondary | ICD-10-CM | POA: Diagnosis not present

## 2021-03-27 DIAGNOSIS — I10 Essential (primary) hypertension: Secondary | ICD-10-CM | POA: Diagnosis not present

## 2021-03-27 DIAGNOSIS — M059 Rheumatoid arthritis with rheumatoid factor, unspecified: Secondary | ICD-10-CM | POA: Diagnosis not present

## 2021-03-27 DIAGNOSIS — E785 Hyperlipidemia, unspecified: Secondary | ICD-10-CM | POA: Diagnosis not present

## 2021-03-27 DIAGNOSIS — N3000 Acute cystitis without hematuria: Secondary | ICD-10-CM | POA: Diagnosis not present

## 2021-03-27 DIAGNOSIS — E039 Hypothyroidism, unspecified: Secondary | ICD-10-CM | POA: Diagnosis not present

## 2021-04-03 DIAGNOSIS — Z0000 Encounter for general adult medical examination without abnormal findings: Secondary | ICD-10-CM | POA: Diagnosis not present

## 2021-04-03 DIAGNOSIS — I10 Essential (primary) hypertension: Secondary | ICD-10-CM | POA: Diagnosis not present

## 2021-04-03 DIAGNOSIS — M0579 Rheumatoid arthritis with rheumatoid factor of multiple sites without organ or systems involvement: Secondary | ICD-10-CM | POA: Diagnosis not present

## 2021-04-03 DIAGNOSIS — M810 Age-related osteoporosis without current pathological fracture: Secondary | ICD-10-CM | POA: Diagnosis not present

## 2021-04-03 DIAGNOSIS — Z950 Presence of cardiac pacemaker: Secondary | ICD-10-CM | POA: Diagnosis not present

## 2021-04-03 DIAGNOSIS — E559 Vitamin D deficiency, unspecified: Secondary | ICD-10-CM | POA: Diagnosis not present

## 2021-04-03 DIAGNOSIS — E039 Hypothyroidism, unspecified: Secondary | ICD-10-CM | POA: Diagnosis not present

## 2021-04-03 DIAGNOSIS — E785 Hyperlipidemia, unspecified: Secondary | ICD-10-CM | POA: Diagnosis not present

## 2021-04-10 DIAGNOSIS — I442 Atrioventricular block, complete: Secondary | ICD-10-CM | POA: Diagnosis not present

## 2021-04-10 DIAGNOSIS — Z45018 Encounter for adjustment and management of other part of cardiac pacemaker: Secondary | ICD-10-CM | POA: Diagnosis not present

## 2021-04-22 DIAGNOSIS — M1990 Unspecified osteoarthritis, unspecified site: Secondary | ICD-10-CM | POA: Diagnosis not present

## 2021-04-22 DIAGNOSIS — Z79899 Other long term (current) drug therapy: Secondary | ICD-10-CM | POA: Diagnosis not present

## 2021-04-22 DIAGNOSIS — M0579 Rheumatoid arthritis with rheumatoid factor of multiple sites without organ or systems involvement: Secondary | ICD-10-CM | POA: Diagnosis not present

## 2021-04-22 DIAGNOSIS — M810 Age-related osteoporosis without current pathological fracture: Secondary | ICD-10-CM | POA: Diagnosis not present

## 2021-04-22 DIAGNOSIS — M109 Gout, unspecified: Secondary | ICD-10-CM | POA: Diagnosis not present

## 2021-04-22 DIAGNOSIS — M79643 Pain in unspecified hand: Secondary | ICD-10-CM | POA: Diagnosis not present

## 2021-05-06 DIAGNOSIS — H3530 Unspecified macular degeneration: Secondary | ICD-10-CM | POA: Diagnosis not present

## 2021-05-06 DIAGNOSIS — G8929 Other chronic pain: Secondary | ICD-10-CM | POA: Diagnosis not present

## 2021-05-06 DIAGNOSIS — J302 Other seasonal allergic rhinitis: Secondary | ICD-10-CM | POA: Diagnosis not present

## 2021-05-06 DIAGNOSIS — M069 Rheumatoid arthritis, unspecified: Secondary | ICD-10-CM | POA: Diagnosis not present

## 2021-05-06 DIAGNOSIS — I509 Heart failure, unspecified: Secondary | ICD-10-CM | POA: Diagnosis not present

## 2021-05-06 DIAGNOSIS — I110 Hypertensive heart disease with heart failure: Secondary | ICD-10-CM | POA: Diagnosis not present

## 2021-05-06 DIAGNOSIS — E039 Hypothyroidism, unspecified: Secondary | ICD-10-CM | POA: Diagnosis not present

## 2021-05-06 DIAGNOSIS — E785 Hyperlipidemia, unspecified: Secondary | ICD-10-CM | POA: Diagnosis not present

## 2021-05-06 DIAGNOSIS — I739 Peripheral vascular disease, unspecified: Secondary | ICD-10-CM | POA: Diagnosis not present

## 2021-05-13 DIAGNOSIS — M109 Gout, unspecified: Secondary | ICD-10-CM | POA: Diagnosis not present

## 2021-05-13 DIAGNOSIS — M059 Rheumatoid arthritis with rheumatoid factor, unspecified: Secondary | ICD-10-CM | POA: Diagnosis not present

## 2021-05-30 ENCOUNTER — Other Ambulatory Visit: Payer: Self-pay | Admitting: Cardiology

## 2021-05-30 DIAGNOSIS — I5022 Chronic systolic (congestive) heart failure: Secondary | ICD-10-CM

## 2021-06-10 ENCOUNTER — Ambulatory Visit: Payer: Medicare PPO

## 2021-06-10 DIAGNOSIS — I5022 Chronic systolic (congestive) heart failure: Secondary | ICD-10-CM | POA: Diagnosis not present

## 2021-06-10 DIAGNOSIS — R55 Syncope and collapse: Secondary | ICD-10-CM

## 2021-06-27 DIAGNOSIS — Z0000 Encounter for general adult medical examination without abnormal findings: Secondary | ICD-10-CM | POA: Diagnosis not present

## 2021-06-27 DIAGNOSIS — E559 Vitamin D deficiency, unspecified: Secondary | ICD-10-CM | POA: Diagnosis not present

## 2021-06-27 DIAGNOSIS — E039 Hypothyroidism, unspecified: Secondary | ICD-10-CM | POA: Diagnosis not present

## 2021-06-27 DIAGNOSIS — E785 Hyperlipidemia, unspecified: Secondary | ICD-10-CM | POA: Diagnosis not present

## 2021-07-04 DIAGNOSIS — I429 Cardiomyopathy, unspecified: Secondary | ICD-10-CM | POA: Diagnosis not present

## 2021-07-04 DIAGNOSIS — E785 Hyperlipidemia, unspecified: Secondary | ICD-10-CM | POA: Diagnosis not present

## 2021-07-04 DIAGNOSIS — K219 Gastro-esophageal reflux disease without esophagitis: Secondary | ICD-10-CM | POA: Diagnosis not present

## 2021-07-04 DIAGNOSIS — M810 Age-related osteoporosis without current pathological fracture: Secondary | ICD-10-CM | POA: Diagnosis not present

## 2021-07-04 DIAGNOSIS — Z950 Presence of cardiac pacemaker: Secondary | ICD-10-CM | POA: Diagnosis not present

## 2021-07-04 DIAGNOSIS — E039 Hypothyroidism, unspecified: Secondary | ICD-10-CM | POA: Diagnosis not present

## 2021-07-04 DIAGNOSIS — M059 Rheumatoid arthritis with rheumatoid factor, unspecified: Secondary | ICD-10-CM | POA: Diagnosis not present

## 2021-07-04 DIAGNOSIS — I10 Essential (primary) hypertension: Secondary | ICD-10-CM | POA: Diagnosis not present

## 2021-07-04 DIAGNOSIS — E559 Vitamin D deficiency, unspecified: Secondary | ICD-10-CM | POA: Diagnosis not present

## 2021-07-10 DIAGNOSIS — I442 Atrioventricular block, complete: Secondary | ICD-10-CM | POA: Diagnosis not present

## 2021-07-10 DIAGNOSIS — Z45018 Encounter for adjustment and management of other part of cardiac pacemaker: Secondary | ICD-10-CM | POA: Diagnosis not present

## 2021-07-17 DIAGNOSIS — L570 Actinic keratosis: Secondary | ICD-10-CM | POA: Diagnosis not present

## 2021-07-17 DIAGNOSIS — M7981 Nontraumatic hematoma of soft tissue: Secondary | ICD-10-CM | POA: Diagnosis not present

## 2021-07-17 DIAGNOSIS — L821 Other seborrheic keratosis: Secondary | ICD-10-CM | POA: Diagnosis not present

## 2021-07-17 DIAGNOSIS — D1801 Hemangioma of skin and subcutaneous tissue: Secondary | ICD-10-CM | POA: Diagnosis not present

## 2021-07-17 DIAGNOSIS — D692 Other nonthrombocytopenic purpura: Secondary | ICD-10-CM | POA: Diagnosis not present

## 2021-07-22 DIAGNOSIS — M79643 Pain in unspecified hand: Secondary | ICD-10-CM | POA: Diagnosis not present

## 2021-07-22 DIAGNOSIS — M1990 Unspecified osteoarthritis, unspecified site: Secondary | ICD-10-CM | POA: Diagnosis not present

## 2021-07-22 DIAGNOSIS — Z79899 Other long term (current) drug therapy: Secondary | ICD-10-CM | POA: Diagnosis not present

## 2021-07-22 DIAGNOSIS — Z8739 Personal history of other diseases of the musculoskeletal system and connective tissue: Secondary | ICD-10-CM | POA: Diagnosis not present

## 2021-07-22 DIAGNOSIS — M810 Age-related osteoporosis without current pathological fracture: Secondary | ICD-10-CM | POA: Diagnosis not present

## 2021-07-22 DIAGNOSIS — M0579 Rheumatoid arthritis with rheumatoid factor of multiple sites without organ or systems involvement: Secondary | ICD-10-CM | POA: Diagnosis not present

## 2021-08-22 DIAGNOSIS — H04552 Acquired stenosis of left nasolacrimal duct: Secondary | ICD-10-CM | POA: Diagnosis not present

## 2021-08-22 DIAGNOSIS — H04222 Epiphora due to insufficient drainage, left lacrimal gland: Secondary | ICD-10-CM | POA: Diagnosis not present

## 2021-09-07 IMAGING — DX DG ABD PORTABLE 1V
1 series · 1 of 1 positions shown · non-contrast
Comparison: 08/17/2019

CLINICAL DATA: NG tube placement

EXAM:
PORTABLE ABDOMEN - 1 VIEW

[abdomen kub]
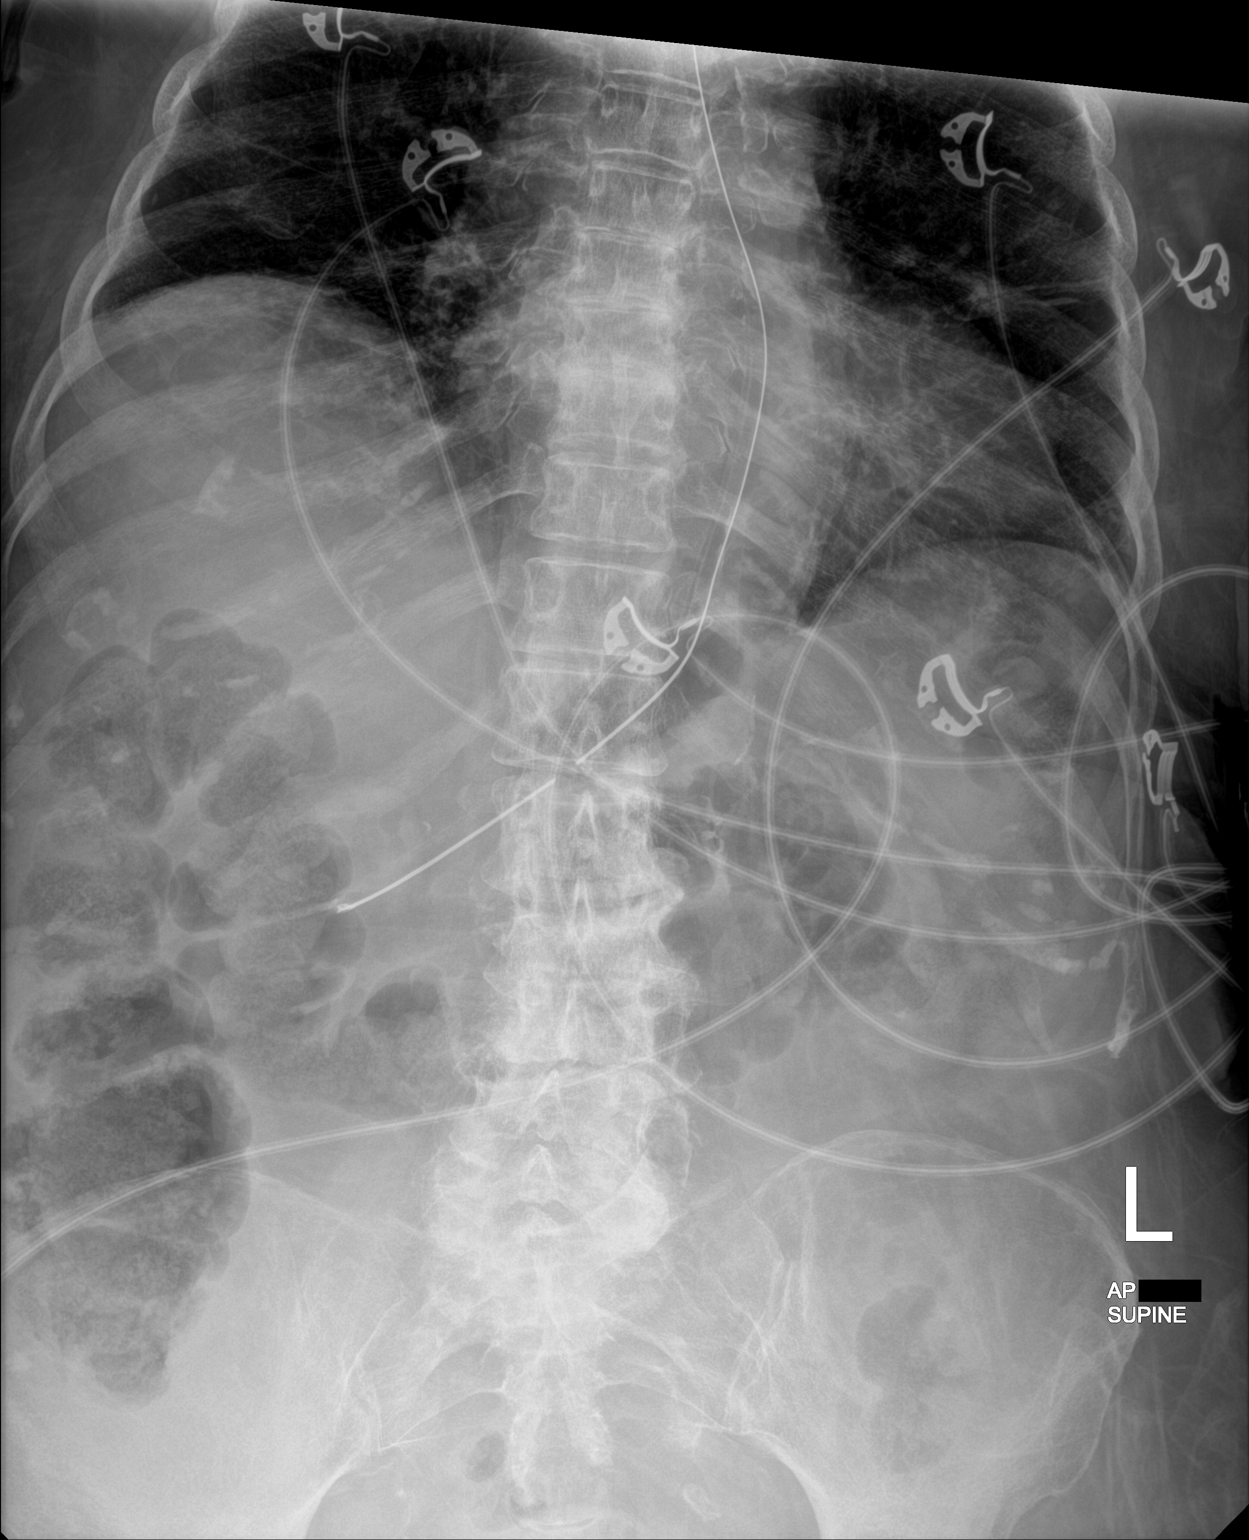

[1 of 1 positions shown; findings below may reference images not displayed]

FINDINGS: NG tube extends below the hemidiaphragms into the distal stomach.
Stomach appears collapsed. Small hiatal hernia noted. Nonspecific
gaseous distention of the colon. No obstruction pattern.

Degenerative changes of the spine. Aortoiliac atherosclerosis noted.
Basilar scarring/atelectasis present.
IMPRESSION: NG tube distal stomach.

## 2021-09-07 IMAGING — DX DG ABD PORTABLE 1V
1 series · 1 of 1 positions shown · non-contrast
Comparison: CT from earlier today

CLINICAL DATA: Nasogastric tube insertion

EXAM:
PORTABLE ABDOMEN - 1 VIEW

[abdomen kub]
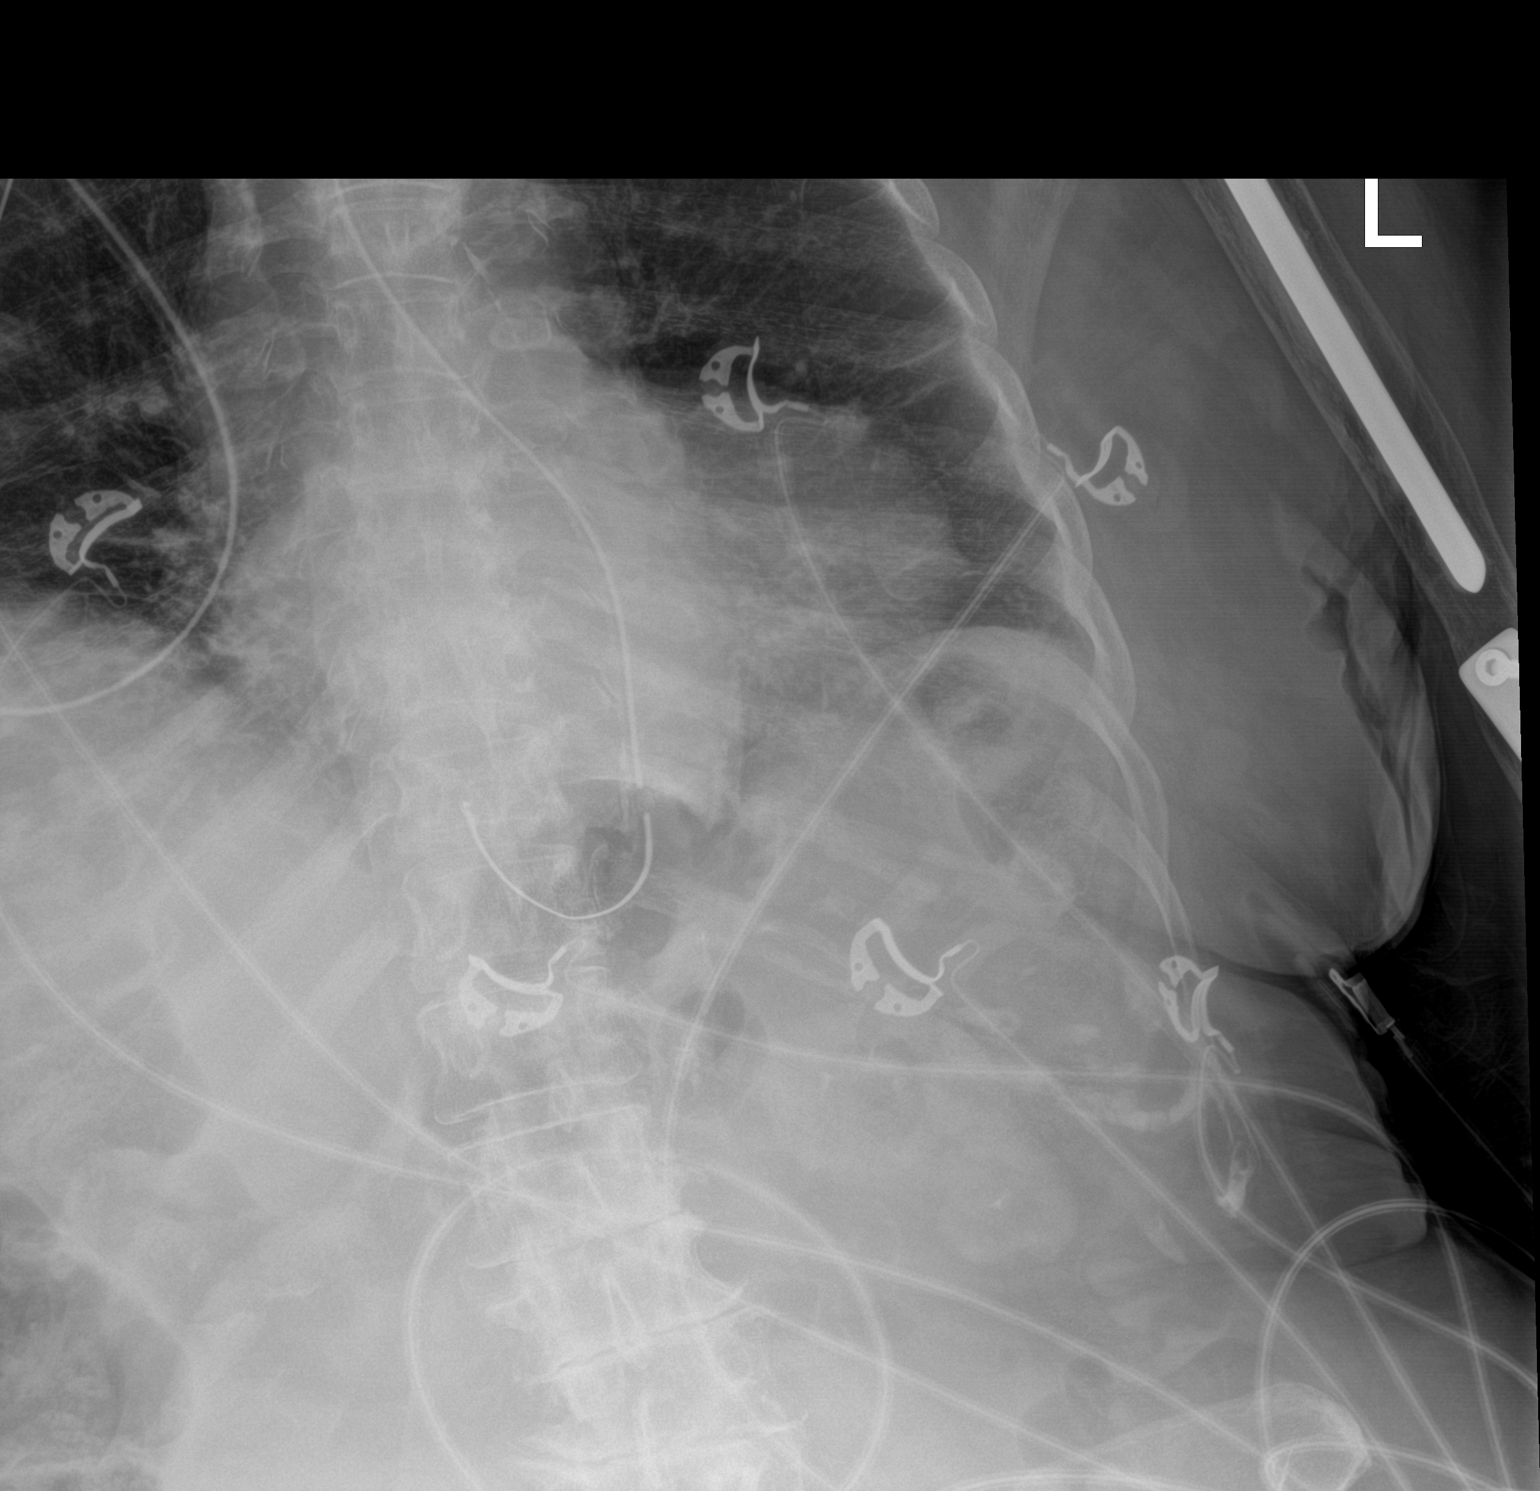

[1 of 1 positions shown; findings below may reference images not displayed]

FINDINGS: The nasogastric tube loops at the level of the diaphragm, based on
prior CT likely looping in a hiatal hernia.

Small bowel obstruction with affected loops essentially not covered
on this study. Cardiomegaly. Visualized lungs are clear
IMPRESSION: The nasogastric tube loops near the diaphragm, suspect it is within
a small hiatal hernia based on prior CT.

## 2021-09-10 IMAGING — DX DG ABDOMEN ACUTE W/ 1V CHEST
3 series · 3 of 3 positions shown · non-contrast
Comparison: 08/18/2019 and chest x-ray 08/16/2019

CLINICAL DATA: Abdominal swelling, possible small bowel
obstruction.

EXAM:
DG ABDOMEN ACUTE W/ 1V CHEST

[chest pa]
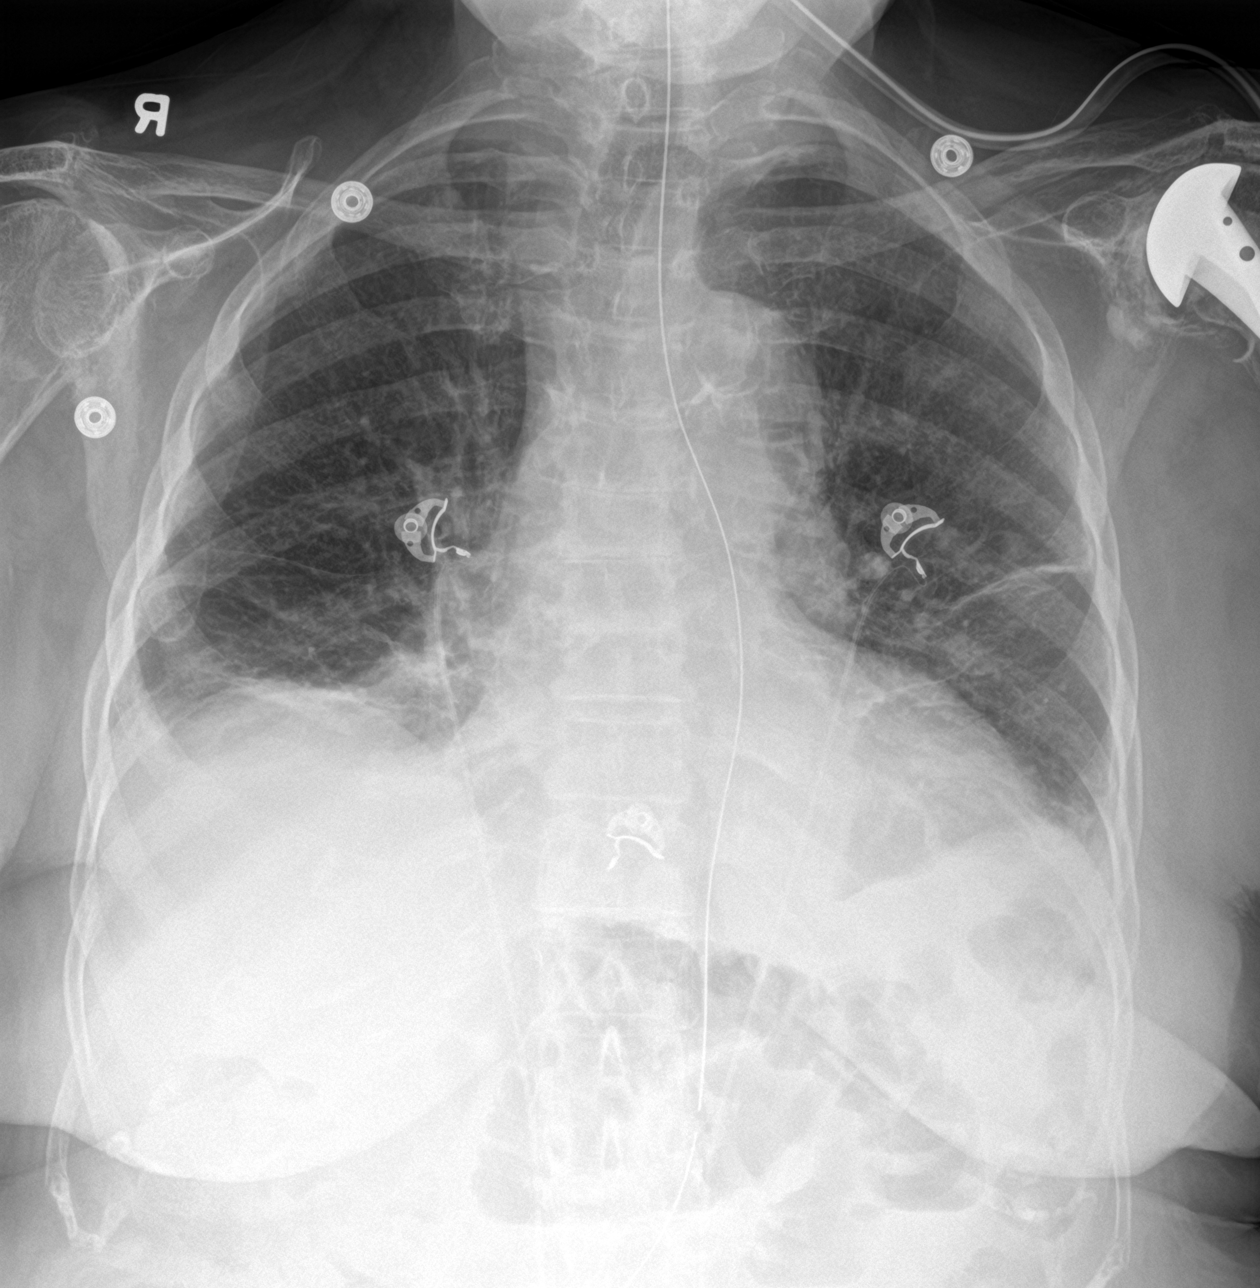

[abdomen kub]
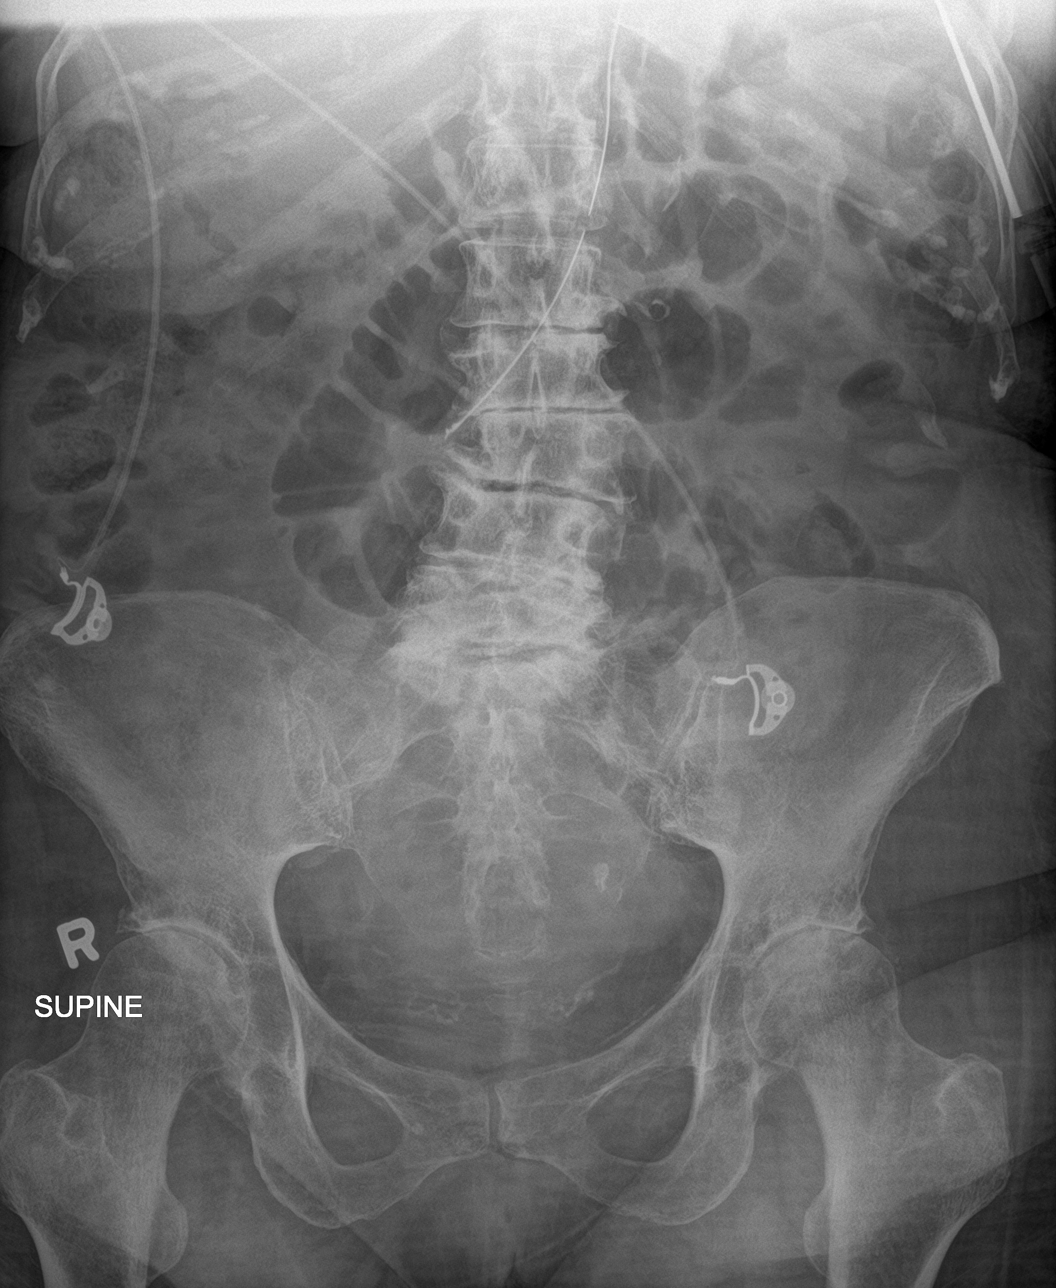

[abdomen erect]
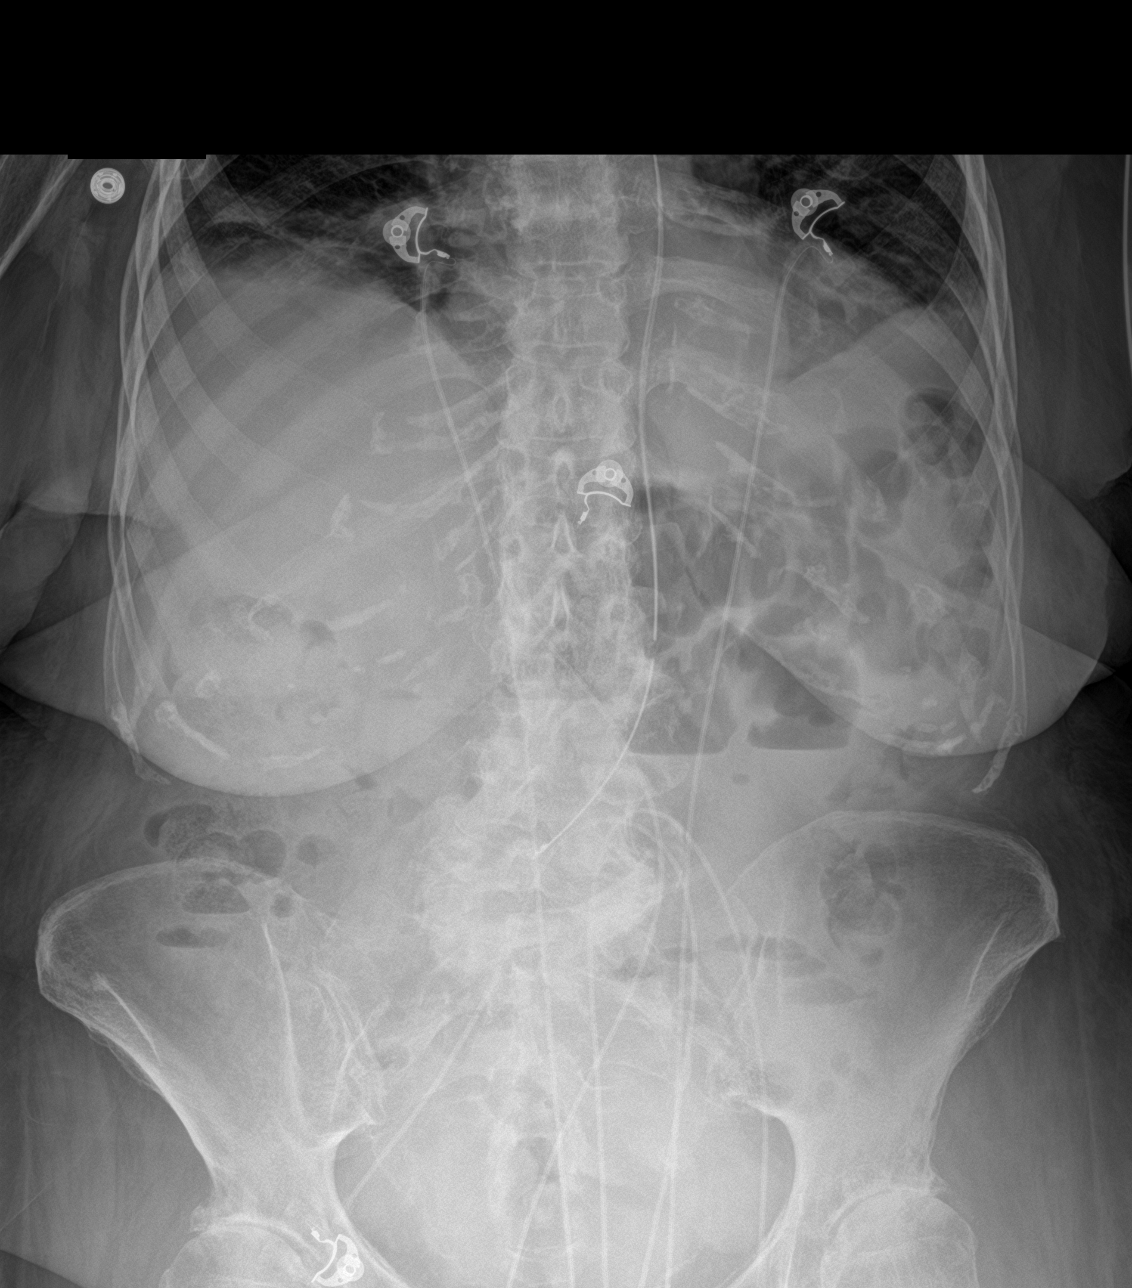

[3 of 3 positions shown; findings below may reference images not displayed]

FINDINGS: Lungs are adequately inflated demonstrate mild right base
opacification likely linear atelectasis and possible small amount of
pleural fluid. Mild linear atelectasis left mid to lower lung.
Cardiomediastinal silhouette and remainder of the chest is
unchanged.

Abdominopelvic images demonstrate a nasogastric tube with tip and
side-port over the stomach in the midline of the mid abdomen. Bowel
gas pattern demonstrates a few mildly dilated air-filled central
small bowel loops and few scattered air-fluid levels. No free
peritoneal air. Air is present within the colon. Paucity of gas over
the rectum. Remainder the exam is unchanged.
IMPRESSION: 1. Persistent mildly dilated air-filled small bowel loops in the
central abdomen.

2. Mild right base opacification likely atelectasis and possible
small amount of pleural fluid. Linear atelectasis left mid to lower
lung.

3. Nasogastric tube with tip and side-port over the stomach in the
midline mid abdomen.

## 2021-10-03 ENCOUNTER — Other Ambulatory Visit: Payer: Self-pay | Admitting: Internal Medicine

## 2021-10-09 DIAGNOSIS — Z45018 Encounter for adjustment and management of other part of cardiac pacemaker: Secondary | ICD-10-CM | POA: Diagnosis not present

## 2021-10-09 DIAGNOSIS — I442 Atrioventricular block, complete: Secondary | ICD-10-CM | POA: Diagnosis not present

## 2021-10-22 DIAGNOSIS — M0579 Rheumatoid arthritis with rheumatoid factor of multiple sites without organ or systems involvement: Secondary | ICD-10-CM | POA: Diagnosis not present

## 2021-10-22 DIAGNOSIS — M79643 Pain in unspecified hand: Secondary | ICD-10-CM | POA: Diagnosis not present

## 2021-10-22 DIAGNOSIS — M810 Age-related osteoporosis without current pathological fracture: Secondary | ICD-10-CM | POA: Diagnosis not present

## 2021-10-22 DIAGNOSIS — Z79899 Other long term (current) drug therapy: Secondary | ICD-10-CM | POA: Diagnosis not present

## 2021-10-22 DIAGNOSIS — M109 Gout, unspecified: Secondary | ICD-10-CM | POA: Diagnosis not present

## 2021-10-22 DIAGNOSIS — M1990 Unspecified osteoarthritis, unspecified site: Secondary | ICD-10-CM | POA: Diagnosis not present

## 2021-11-06 ENCOUNTER — Ambulatory Visit: Payer: Medicare PPO | Admitting: Cardiology

## 2021-11-06 ENCOUNTER — Encounter: Payer: Self-pay | Admitting: Cardiology

## 2021-11-06 VITALS — BP 160/78 | HR 62 | Temp 97.8°F | Resp 16 | Ht 61.0 in | Wt 129.2 lb

## 2021-11-06 DIAGNOSIS — I10 Essential (primary) hypertension: Secondary | ICD-10-CM

## 2021-11-06 DIAGNOSIS — Z950 Presence of cardiac pacemaker: Secondary | ICD-10-CM

## 2021-11-06 DIAGNOSIS — I428 Other cardiomyopathies: Secondary | ICD-10-CM

## 2021-11-06 DIAGNOSIS — Z45018 Encounter for adjustment and management of other part of cardiac pacemaker: Secondary | ICD-10-CM | POA: Diagnosis not present

## 2021-11-06 DIAGNOSIS — I442 Atrioventricular block, complete: Secondary | ICD-10-CM | POA: Diagnosis not present

## 2021-11-06 NOTE — Progress Notes (Signed)
Primary Physician/Referring:  [facility] [place]  Patient ID: Pamela Dunn, female    DOB: 01-29-1932, 88 y.o.   MRN: [id number]  Chief Complaint  Patient presents with   Complete heart block    With pacemaker check   Congestive Heart Failure   Follow-up    HPI: Pamela Dunn  is a 88 y.o. female  with with long-standing history of recurrent rheumatoid arthritis, left bundle branch block, nonischemic cardiomyopathy with negative nuclear stress test and EF 20% in July 2020, EF improved to 45% by medical therapy, hypertension, hyperlipidemia, chronic stage IIIa kidney disease, syncope and complete heart block on 07/11/2020 SP Abbott dual-chamber pacemaker implantation on 07/12/2020, severe DJD and RA with arthritis and her physical disability due to arthritis and using a cane to walk, also has pain in her hand joints.  She is presently asymptomatic.  She has not had any further recurrence of syncope.  She denies chest pain, dyspnea, leg edema.  Past Medical History:  Diagnosis Date   Allergy    seasonal   Anemia    Arthritis    Encounter for care of pacemaker 07/12/2020   GERD (gastroesophageal reflux disease)    Heart block AV complete ([place]) 07/11/2020   Hyperlipidemia    Hypertension    Pacemaker Abbott Assurity MRI model [id number] 07/12/2020 07/12/2020    Past Surgical History:  Procedure Laterality Date   APPENDECTOMY     BACK SURGERY     BOWEL RESECTION N/A 08/24/2019   Procedure: SMALL BOWEL RESECTION;  Surgeon: Coralie Keens, MD;  Location: [facility];  Service: General;  Laterality: N/A;   LAPAROSCOPY N/A 08/24/2019   Procedure: LAPAROSCOPY DIAGNOSTIC;  Surgeon: Coralie Keens, MD;  Location: [facility];  Service: General;  Laterality: N/A;   LAPAROTOMY N/A 08/24/2019   Procedure: EXPLORATORY LAPAROTOMY;  Surgeon: Coralie Keens, MD;  Location: [facility];  Service: General;  Laterality: N/A;   PACEMAKER IMPLANT N/A 07/12/2020   Procedure: PACEMAKER IMPLANT;  Surgeon:  Thompson Grayer, MD;  Location: [facility];  Service: Cardiovascular;  Laterality: N/A;   Social History   Tobacco Use   Smoking status: Never   Smokeless tobacco: Never  Substance Use Topics   Alcohol use: No   Marital Status: Widowed   Review of Systems  Cardiovascular:  Negative for chest pain, dyspnea on exertion and leg swelling.  Musculoskeletal:  Positive for arthritis and joint pain.   Objective         11/06/2021    1:27 PM 11/06/2021    1:16 PM 11/05/2020    1:54 PM  Vitals with BMI  Height  _0    Weight  129 lbs 3 oz   BMI  83.29   Systolic 191 660 600  Diastolic 78 75 50  Pulse 62 64 72    Physical Exam Constitutional:      Appearance: She is well-developed.     Comments: Short stature  Neck:     Vascular: Carotid bruit present. No JVD.  Cardiovascular:     Rate and Rhythm: Normal rate and regular rhythm.     Pulses: Intact distal pulses.     Heart sounds: No murmur heard.    No gallop.  Pulmonary:     Effort: Pulmonary effort is normal.     Breath sounds: Normal breath sounds.  Abdominal:     General: Bowel sounds are normal.     Palpations: Abdomen is soft.  Musculoskeletal:  General: Deformity (burnt out RA in hands) present.     Right lower leg: No edema.     Left lower leg: No edema.    Radiology: No results found.  Laboratory examination:      Latest Ref Rng & Units 07/11/2020   12:58 PM 09/02/2019    3:38 AM 09/01/2019    4:22 AM  CMP  Glucose 70 - 99 mg/dL 112  106  91   BUN 8 - 23 mg/dL _0 Creatinine 0.44 - 1.00 mg/dL 1.15  1.23  1.49   Sodium 135 - 145 mmol/L 137  136  136   Potassium 3.5 - 5.1 mmol/L 4.0  3.7  3.6   Chloride 98 - 111 mmol/L 104  103  103   CO2 22 - 32 mmol/L _1 Calcium 8.9 - 10.3 mg/dL 9.0  7.9  7.4       Latest Ref Rng & Units 07/11/2020   12:58 PM 09/02/2019    3:38 AM 09/01/2019    4:22 AM  CBC  WBC 4.0 - 10.5 K/uL 12.9  17.6  21.4   Hemoglobin 12.0 - 15.0 g/dL 11.9  9.0   8.6   Hematocrit 36.0 - 46.0 % 38.0  28.2  26.7   Platelets 150 - 400 K/uL 280  360  331     External labs:  Labs 10/22/2021:  Hb 11.3/HCT 34.3, platelets 301.  Normal indicis.  BUN 22, creatinine 1.19, EGFR 41 mL, potassium 4.9.  LFTs normal.  Labs 06/27/2021:  Total cholesterol 174, triglycerides 142, HDL 47, LDL 99.  TSH normal at 4.76.  Vitamin D normal at 58.7.  Medications   Allergies  Allergen Reactions   Coreg [Carvedilol] Diarrhea   Lorazepam Diarrhea     Current Outpatient Medications:    Aspirin-Caffeine 500-32.5 MG TABS, Take 1 tablet by mouth daily as needed (pain). , Disp: , Rfl:    BIDIL 20-37.5 MG tablet, TAKE 1 TABLET THREE TIMES DAILY, Disp: 270 tablet, Rfl: 2   cholecalciferol (VITAMIN D) 1000 UNITS tablet, Take 1,000 Units by mouth daily. , Disp: , Rfl:    Coenzyme Q10 (COQ10) 200 MG CAPS, Take 200 mg by mouth daily., Disp: , Rfl:    ezetimibe (ZETIA) 10 MG tablet, Take 10 mg by mouth daily. , Disp: , Rfl:    levothyroxine (SYNTHROID) 25 MCG tablet, Take 25 mcg by mouth daily. , Disp: , Rfl:    metoprolol succinate (TOPROL-XL) 25 MG 24 hr tablet, TAKE 1 TABLET (25 MG TOTAL) BY MOUTH DAILY., Disp: 30 tablet, Rfl: 0   mupirocin nasal ointment (BACTROBAN) 2 %, Place 1 application into the nose 2 (two) times daily. Use one-half of tube in each nostril twice daily for five (5) days. After application, press sides of nose together and gently massage., Disp: , Rfl:    Omega-3 Fatty Acids (OMEGA-3 FISH OIL PO), Take 1 capsule by mouth daily., Disp: , Rfl:    predniSONE (DELTASONE) 5 MG tablet, Take 5 mg by mouth daily., Disp: , Rfl:    sulfaSALAzine (AZULFIDINE) 500 MG tablet, Take 500 mg by mouth daily., Disp: , Rfl:     Cardiac Studies:   Lexiscan Myoview Stress Test 09/06/2018: Resting EKG demonstrates normal sinus rhythm.  Left bundle branch block.  Stress EKG is non-diagnostic as it's a pharmacologic stress test. Nuclear images reveal left ventricle to be  mildly dilated both in rest and stress images  with end-diastolic volume of 426 mL.  The perfusion imaging study demonstrates a moderate sized inferior and inferolateral decreased uptake suggestive of soft tissue attenuation.  In addition there is a very small sized apical septal defect suggestive of ischemia. The left ventricle systolic function calculated by QGS was 24% with global hypokinesis. This represents a high risk study in view of low EF, clinical correlation recommended. Findings may represent non ischemic dilated cardiomyopathy.  Nocturnal oximetry 12/02/2018: SPO2 <88% 23 minutes, <89% 37 minutes, highest SPO2 99%, lowest SPO2 69%.  Oxygen desaturation events 3% (316 events).  Patient prescribed nocturnal oxygen supplementation. Now off O2 due to resolution of fatigue.  Echocardiogram 07/12/2020:  1. Left ventricular ejection fraction, by estimation, is 45%. The left ventricle has mildly decreased function. There is moderate concentric left ventricular hypertrophy. Left ventricular diastolic parameters are consistent with Grade I diastolic  dysfunction (impaired relaxation). Elevated left atrial pressure.  2. Right ventricular systolic function is normal. The right ventricular size is normal.  3. Right atrial size was mildly dilated.  4. The mitral valve is grossly normal. No evidence of mitral valve regurgitation.  5. Tricuspid valve regurgitation is mild to moderate.  6. The aortic valve is tricuspid. Aortic valve regurgitation is not visualized. No aortic stenosis is present.  7. The inferior vena cava is normal in size with greater than 50% respiratory variability, suggesting right atrial pressure of 3 mmHg. No significant change from 06/16/2019    Pacemaker Abbott Assurity MRI model [id number] 07/12/2020   Scheduled  In office pacemaker check 11/06/21  Single (S)/Dual (D)/BV: D. Presenting NSR @ 61/min. Pacemaker dependant:  No. Underlying Sinus rhythm. AP 45%, VP <1%. . No high rate  episodes. Longevity 10 Years. Magnet rate: >85%. Lead measurements: Stable. Histogram: Low (L)/normal (N)/high (H)  Normal. Patient activity 4-6 hours.   Observations: Normal pacemaker function. Changes: None.   Remote pacemaker transmission 10/09/2021: AP 57%, VP 1%.  Longevity 9 years and 4 months.  Lead impedance and thresholds are normal with no high rate episodes.  Normal pacemaker function.  EKG:   EKG 11/06/2021: Normal sinus rhythm at the rate of 68 bpm, demand atrially paced rhythm.  Underlying left bundle branch block.  Occasional PVCs.   Assessment     ICD-10-CM   1. Pacemaker Abbott Assurity MRI model [id number] 07/12/2020  Z95.0     2. Encounter for care of pacemaker  Z45.018     3. Heart block AV complete (HCC)  I44.2 EKG 12-Lead    4. Primary hypertension  I10     5. Non-ischemic cardiomyopathy ([place])  I42.8      No orders of the defined types were placed in this encounter.  Medications Discontinued During This Encounter  Medication Reason   folic acid (FOLVITE) 1 MG tablet    leflunomide (ARAVA) 20 MG tablet    Multiple Vitamins-Minerals (EYE VITAMINS & MINERALS PO)      Recommendations:    AZARAH DACY  is a 88 y.o. female  with with long-standing history of recurrent rheumatoid arthritis, left bundle branch block, nonischemic cardiomyopathy with negative nuclear stress test and EF 20% in July 2020, EF improved to 45% by medical therapy, hypertension, hyperlipidemia, chronic stage IIIa kidney disease, syncope and complete heart block on 07/11/2020 SP Abbott dual-chamber pacemaker implantation on 07/12/2020, severe DJD and RA with arthritis and her physical disability due to arthritis and using a cane to walk, also has pain in her hand joints.  She is here for  pacemaker check and f.u of cardiomyopathy.  Pacemaker check performed in the office today, normal pacemaker function.  No programming changes were performed.  Lead health is normal.  Her activity level is also  much better.  Today the blood pressure was markedly elevated, however patient was in slight distress chest prior to coming to the office, most times her blood pressure is very well controlled.  Hence I did not make any changes to her medications.  She remains asymptomatic without any chest pain, dizziness or dyspnea, no leg edema.  Physical examination is unremarkable.  Otherwise.  I will see her back in a year for pacemaker check.  With regard to nonischemic cardiomyopathy, no clinical evidence of heart failure, as she remains asymptomatic, presently 88 years of age, no further testing is indicated with regard to this.  I reviewed her labs, renal function has remained stable, blood counts are normal as well.   Adrian Prows, MD, [facility] 11/06/2021, 1:47 PM Office: [phone]

## 2021-12-07 ENCOUNTER — Other Ambulatory Visit: Payer: Self-pay | Admitting: Internal Medicine

## 2021-12-12 DIAGNOSIS — H5203 Hypermetropia, bilateral: Secondary | ICD-10-CM | POA: Diagnosis not present

## 2021-12-12 DIAGNOSIS — H52223 Regular astigmatism, bilateral: Secondary | ICD-10-CM | POA: Diagnosis not present

## 2021-12-12 DIAGNOSIS — H524 Presbyopia: Secondary | ICD-10-CM | POA: Diagnosis not present

## 2021-12-12 DIAGNOSIS — Z961 Presence of intraocular lens: Secondary | ICD-10-CM | POA: Diagnosis not present

## 2021-12-12 DIAGNOSIS — H353131 Nonexudative age-related macular degeneration, bilateral, early dry stage: Secondary | ICD-10-CM | POA: Diagnosis not present

## 2021-12-20 ENCOUNTER — Other Ambulatory Visit: Payer: Self-pay | Admitting: Internal Medicine

## 2021-12-29 ENCOUNTER — Other Ambulatory Visit: Payer: Self-pay | Admitting: Internal Medicine

## 2021-12-31 DIAGNOSIS — E039 Hypothyroidism, unspecified: Secondary | ICD-10-CM | POA: Diagnosis not present

## 2021-12-31 DIAGNOSIS — R7309 Other abnormal glucose: Secondary | ICD-10-CM | POA: Diagnosis not present

## 2021-12-31 DIAGNOSIS — I10 Essential (primary) hypertension: Secondary | ICD-10-CM | POA: Diagnosis not present

## 2021-12-31 DIAGNOSIS — E559 Vitamin D deficiency, unspecified: Secondary | ICD-10-CM | POA: Diagnosis not present

## 2021-12-31 DIAGNOSIS — R350 Frequency of micturition: Secondary | ICD-10-CM | POA: Diagnosis not present

## 2021-12-31 DIAGNOSIS — E785 Hyperlipidemia, unspecified: Secondary | ICD-10-CM | POA: Diagnosis not present

## 2022-01-01 ENCOUNTER — Other Ambulatory Visit: Payer: Self-pay | Admitting: Internal Medicine

## 2022-01-06 DIAGNOSIS — E559 Vitamin D deficiency, unspecified: Secondary | ICD-10-CM | POA: Diagnosis not present

## 2022-01-06 DIAGNOSIS — M059 Rheumatoid arthritis with rheumatoid factor, unspecified: Secondary | ICD-10-CM | POA: Diagnosis not present

## 2022-01-06 DIAGNOSIS — Z950 Presence of cardiac pacemaker: Secondary | ICD-10-CM | POA: Diagnosis not present

## 2022-01-06 DIAGNOSIS — K219 Gastro-esophageal reflux disease without esophagitis: Secondary | ICD-10-CM | POA: Diagnosis not present

## 2022-01-06 DIAGNOSIS — M810 Age-related osteoporosis without current pathological fracture: Secondary | ICD-10-CM | POA: Diagnosis not present

## 2022-01-06 DIAGNOSIS — I429 Cardiomyopathy, unspecified: Secondary | ICD-10-CM | POA: Diagnosis not present

## 2022-01-06 DIAGNOSIS — I10 Essential (primary) hypertension: Secondary | ICD-10-CM | POA: Diagnosis not present

## 2022-01-06 DIAGNOSIS — E785 Hyperlipidemia, unspecified: Secondary | ICD-10-CM | POA: Diagnosis not present

## 2022-01-06 DIAGNOSIS — E039 Hypothyroidism, unspecified: Secondary | ICD-10-CM | POA: Diagnosis not present

## 2022-01-08 DIAGNOSIS — I442 Atrioventricular block, complete: Secondary | ICD-10-CM | POA: Diagnosis not present

## 2022-01-08 DIAGNOSIS — Z45018 Encounter for adjustment and management of other part of cardiac pacemaker: Secondary | ICD-10-CM | POA: Diagnosis not present

## 2022-01-13 ENCOUNTER — Other Ambulatory Visit: Payer: Self-pay | Admitting: Internal Medicine

## 2022-01-14 ENCOUNTER — Other Ambulatory Visit: Payer: Self-pay

## 2022-01-14 MED ORDER — METOPROLOL SUCCINATE ER 25 MG PO TB24: 25.0000 mg | ORAL_TABLET | Freq: Every day | ORAL | 0 refills | Status: DC

## 2022-03-04 ENCOUNTER — Encounter (HOSPITAL_COMMUNITY): Payer: Self-pay

## 2022-03-04 ENCOUNTER — Other Ambulatory Visit: Payer: Self-pay

## 2022-03-04 ENCOUNTER — Emergency Department (HOSPITAL_COMMUNITY)
Admission: EM | Admit: 2022-03-04 | Discharge: 2022-03-05 | Disposition: A | Discharge status: Home / Self Care | Payer: Medicare PPO | Attending: Emergency Medicine | Admitting: Emergency Medicine

## 2022-03-04 ENCOUNTER — Emergency Department (HOSPITAL_COMMUNITY): Payer: Medicare PPO

## 2022-03-04 DIAGNOSIS — S5011XA Contusion of right forearm, initial encounter: Secondary | ICD-10-CM | POA: Diagnosis not present

## 2022-03-04 DIAGNOSIS — X58XXXA Exposure to other specified factors, initial encounter: Secondary | ICD-10-CM | POA: Insufficient documentation

## 2022-03-04 DIAGNOSIS — R42 Dizziness and giddiness: Secondary | ICD-10-CM | POA: Diagnosis present

## 2022-03-04 DIAGNOSIS — R55 Syncope and collapse: Secondary | ICD-10-CM | POA: Insufficient documentation

## 2022-03-04 DIAGNOSIS — S5012XA Contusion of left forearm, initial encounter: Secondary | ICD-10-CM | POA: Diagnosis not present

## 2022-03-04 DIAGNOSIS — S8011XA Contusion of right lower leg, initial encounter: Secondary | ICD-10-CM | POA: Diagnosis not present

## 2022-03-04 DIAGNOSIS — Z743 Need for continuous supervision: Secondary | ICD-10-CM | POA: Diagnosis not present

## 2022-03-04 DIAGNOSIS — S80211A Abrasion, right knee, initial encounter: Secondary | ICD-10-CM | POA: Insufficient documentation

## 2022-03-04 DIAGNOSIS — S8012XA Contusion of left lower leg, initial encounter: Secondary | ICD-10-CM | POA: Diagnosis not present

## 2022-03-04 DIAGNOSIS — Z950 Presence of cardiac pacemaker: Secondary | ICD-10-CM | POA: Insufficient documentation

## 2022-03-04 DIAGNOSIS — R059 Cough, unspecified: Secondary | ICD-10-CM | POA: Diagnosis not present

## 2022-03-04 DIAGNOSIS — Z1152 Encounter for screening for COVID-19: Secondary | ICD-10-CM | POA: Insufficient documentation

## 2022-03-04 DIAGNOSIS — J189 Pneumonia, unspecified organism: Secondary | ICD-10-CM

## 2022-03-04 DIAGNOSIS — I499 Cardiac arrhythmia, unspecified: Secondary | ICD-10-CM | POA: Diagnosis not present

## 2022-03-04 DIAGNOSIS — R6889 Other general symptoms and signs: Secondary | ICD-10-CM | POA: Diagnosis not present

## 2022-03-04 DIAGNOSIS — J168 Pneumonia due to other specified infectious organisms: Secondary | ICD-10-CM | POA: Diagnosis not present

## 2022-03-04 DIAGNOSIS — J181 Lobar pneumonia, unspecified organism: Secondary | ICD-10-CM | POA: Insufficient documentation

## 2022-03-04 DIAGNOSIS — R110 Nausea: Secondary | ICD-10-CM | POA: Diagnosis not present

## 2022-03-04 LAB — CBC WITH DIFFERENTIAL/PLATELET
Abs Immature Granulocytes: 0.22 10*3/uL — ABNORMAL HIGH (ref 0.00–0.07)
Basophils Absolute: 0.1 10*3/uL (ref 0.0–0.1)
Basophils Relative: 0 %
Eosinophils Absolute: 0.2 10*3/uL (ref 0.0–0.5)
Eosinophils Relative: 2 %
HCT: 34.4 % — ABNORMAL LOW (ref 36.0–46.0)
Hemoglobin: 11.5 g/dL — ABNORMAL LOW (ref 12.0–15.0)
Immature Granulocytes: 2 %
Lymphocytes Relative: 9 %
Lymphs Abs: 1 10*3/uL (ref 0.7–4.0)
MCH: 30.2 pg (ref 26.0–34.0)
MCHC: 33.4 g/dL (ref 30.0–36.0)
MCV: 90.3 fL (ref 80.0–100.0)
Monocytes Absolute: 0.6 10*3/uL (ref 0.1–1.0)
Monocytes Relative: 5 %
Neutro Abs: 9.2 10*3/uL — ABNORMAL HIGH (ref 1.7–7.7)
Neutrophils Relative %: 82 %
Platelets: 321 10*3/uL (ref 150–400)
RBC: 3.81 MIL/uL — ABNORMAL LOW (ref 3.87–5.11)
RDW: 15.5 % (ref 11.5–15.5)
WBC: 11.2 10*3/uL — ABNORMAL HIGH (ref 4.0–10.5)
nRBC: 0 % (ref 0.0–0.2)

## 2022-03-04 LAB — RESP PANEL BY RT-PCR (RSV, FLU A&B, COVID)  RVPGX2
Influenza A by PCR: NEGATIVE
Influenza B by PCR: NEGATIVE
Resp Syncytial Virus by PCR: NEGATIVE
SARS Coronavirus 2 by RT PCR: NEGATIVE

## 2022-03-04 LAB — TROPONIN I (HIGH SENSITIVITY): Troponin I (High Sensitivity): 13 ng/L (ref <18)

## 2022-03-04 LAB — COMPREHENSIVE METABOLIC PANEL
ALT: 13 U/L (ref 0–44)
AST: 24 U/L (ref 15–41)
Albumin: 3.3 g/dL — ABNORMAL LOW (ref 3.5–5.0)
Alkaline Phosphatase: 51 U/L (ref 38–126)
Anion gap: 12 (ref 5–15)
BUN: 27 mg/dL — ABNORMAL HIGH (ref 8–23)
CO2: 26 mmol/L (ref 22–32)
Calcium: 9.1 mg/dL (ref 8.9–10.3)
Chloride: 100 mmol/L (ref 98–111)
Creatinine, Ser: 1.23 mg/dL — ABNORMAL HIGH (ref 0.44–1.00)
GFR, Estimated: 42 mL/min — ABNORMAL LOW (ref >60)
Glucose, Bld: 114 mg/dL — ABNORMAL HIGH (ref 70–99)
Potassium: 4.6 mmol/L (ref 3.5–5.1)
Sodium: 138 mmol/L (ref 135–145)
Total Bilirubin: 0.3 mg/dL (ref 0.3–1.2)
Total Protein: 6 g/dL — ABNORMAL LOW (ref 6.5–8.1)

## 2022-03-04 LAB — PHOSPHORUS: Phosphorus: 3.8 mg/dL (ref 2.5–4.6)

## 2022-03-04 LAB — MAGNESIUM: Magnesium: 1.9 mg/dL (ref 1.7–2.4)

## 2022-03-04 LAB — LACTIC ACID, PLASMA: Lactic Acid, Venous: 0.9 mmol/L (ref 0.5–1.9)

## 2022-03-04 MED ORDER — AZITHROMYCIN 250 MG PO TABS: 250.0000 mg | ORAL_TABLET | Freq: Every day | ORAL | 0 refills | Status: DC

## 2022-03-04 MED ORDER — IPRATROPIUM-ALBUTEROL 0.5-2.5 (3) MG/3ML IN SOLN
3.0000 mL | Freq: Once | RESPIRATORY_TRACT | Status: AC
  Administered 2022-03-04: 3 mL via RESPIRATORY_TRACT
  Filled 2022-03-04: qty 9

## 2022-03-04 MED ORDER — AEROCHAMBER PLUS FLO-VU LARGE MISC
1.0000 | Freq: Once | Status: AC
  Administered 2022-03-04: 1

## 2022-03-04 MED ORDER — BENZONATATE 100 MG PO CAPS
100.0000 mg | ORAL_CAPSULE | Freq: Once | ORAL | Status: AC
  Administered 2022-03-04: 100 mg via ORAL
  Filled 2022-03-04: qty 1

## 2022-03-04 MED ORDER — SODIUM CHLORIDE 0.9 % IV SOLN
1.0000 g | Freq: Once | INTRAVENOUS | Status: AC
  Administered 2022-03-04: 1 g via INTRAVENOUS
  Filled 2022-03-04: qty 10

## 2022-03-04 MED ORDER — ALBUTEROL SULFATE HFA 108 (90 BASE) MCG/ACT IN AERS
2.0000 | INHALATION_SPRAY | RESPIRATORY_TRACT | Status: DC | PRN
  Filled 2022-03-04: qty 6.7

## 2022-03-04 MED ORDER — BENZONATATE 100 MG PO CAPS: 100.0000 mg | ORAL_CAPSULE | Freq: Three times a day (TID) | ORAL | 0 refills | Status: DC

## 2022-03-04 MED ORDER — AZITHROMYCIN 250 MG PO TABS
500.0000 mg | ORAL_TABLET | Freq: Once | ORAL | Status: AC
  Administered 2022-03-04: 500 mg via ORAL
  Filled 2022-03-04: qty 2

## 2022-03-04 NOTE — ED Triage Notes (Signed)
PER EMS: pt was sitting up at her table, stood up before she was able to eat her lunch and she felt dizzy and had a near syncopal episode. She did not pass out, did not fall. Son assisted her to the floor. She has a pacemaker. Denies cp/sob. + cough x 1 week.   BP- 130/70, HR-60 paced with PVCs, O2-98% RA, CBG-130, RR-16, A&OX4

## 2022-03-04 NOTE — Discharge Instructions (Signed)
.    Treatment has been started for pneumonia in the emergency department.  You were given a dose of Rocephin IV and a dose of Zithromax orally.  Fill your prescription for Zithromax tomorrow and start taking it tomorrow evening.  You have been given an albuterol inhaler to use.  This is for wheezing with bronchitis and pneumonia.  You may take 2 puffs every 4-6 hours to help with coughing and wheezing.  You have been given a prescription for medication called Tessalon Perles.  This is to help for coughing.  Swallow this whole.  Do not chew on it or suck on it.  Follow-up with your family doctor or cardiologist for recheck as soon as possible.  Return to the emergency department immediately if you have new worsening or concerning symptoms.

## 2022-03-04 NOTE — ED Provider Triage Note (Signed)
Emergency Medicine Provider Triage Evaluation Note  Pamela Dunn , a 88 y.o. female  was evaluated in triage.  Pt complains of syncopal episode.  Patient was sitting down for lunch and stated she did not feel well.  Her son witnessed her pass out but was able to catch her before she fell.  She endorsed some dizziness at that time.  The patient does have a pacemaker.  She denies shortness of breath or chest pain at this time.  She does endorse 1 week of cough.  Review of Systems  Positive: As above Negative: As above  Physical Exam  BP (!) 142/99 (BP Location: Right Arm)   Pulse (!) 59   Temp 98.1 F (36.7 C) (Oral)   Resp 17   Ht 5' (1.524 m)   Wt 59.4 kg   SpO2 95%   BMI 25.58 kg/m  Gen:   Awake, no distress   Resp:  Normal effort  MSK:   Moves extremities without difficulty  Other:    Medical Decision Making  Medically screening exam initiated at 2:41 PM.  Appropriate orders placed.  LATRESE CAROLAN was informed that the remainder of the evaluation will be completed by another provider, this initial triage assessment does not replace that evaluation, and the importance of remaining in the ED until their evaluation is complete.     Darrick Grinder, PA-C 03/04/22 1442

## 2022-03-04 NOTE — ED Notes (Signed)
Patient required shower.

## 2022-03-04 NOTE — ED Provider Notes (Signed)
[facility] EMERGENCY DEPARTMENT Provider Note   CSN: [id number] Arrival date & time: 03/04/22  1430     History  Chief Complaint  Patient presents with   Dizziness    Pamela Dunn is a 88 y.o. female.  HPI Patient gone out to lunch with family members but before eating felt suddenly weak and nauseated.  She reports that she felt that she needed to go home.  She uses a walker and her son had come to assist her in walking outside.  They got about 3 steps and she passed out.  Patient's family members report while she was seated and feeling unwell her face was very flushed.  Denies that she was specifically having any pain at that time.  She was not having chest pain or headache.  After she passed out, she vomited a small amount.  EMS arrived.  Patient regained consciousness without other intervention.  Reportedly however her blood pressure was systolic in the 90s at that time.  Patient reports once awake again however she was not having chest pain, shortness of breath or headache.  She reports she has had a pretty bad cough for about a week and a half.  Her family members are thinking that she might have pneumonia.  She has not had any fever that she is aware of.  No vomiting or diarrhea.  She has been continuing to eat and drink.    Home Medications Prior to Admission medications   Medication Sig Start Date End Date Taking? Authorizing Provider  azithromycin (ZITHROMAX) 250 MG tablet Take 1 tablet (250 mg total) by mouth daily. 03/04/22  Yes Arby Barrette, MD  benzonatate (TESSALON) 100 MG capsule Take 1 capsule (100 mg total) by mouth every 8 (eight) hours. 03/04/22  Yes Juanantonio Stolar, Lebron Conners, MD  Aspirin-Caffeine 500-32.5 MG TABS Take 1 tablet by mouth daily as needed (pain).     [provider]  BIDIL 20-37.5 MG tablet TAKE 1 TABLET THREE TIMES DAILY 05/30/21   Yates Decamp, MD  cholecalciferol (VITAMIN D) 1000 UNITS tablet Take 1,000 Units by mouth daily.      [provider]  Coenzyme Q10 (COQ10) 200 MG CAPS Take 200 mg by mouth daily.    [provider]  ezetimibe (ZETIA) 10 MG tablet Take 10 mg by mouth daily.     [provider]  levothyroxine (SYNTHROID) 25 MCG tablet Take 25 mcg by mouth daily.     [provider]  metoprolol succinate (TOPROL-XL) 25 MG 24 hr tablet Take 1 tablet (25 mg total) by mouth daily. 01/14/22   Yates Decamp, MD  mupirocin nasal ointment (BACTROBAN) 2 % Place 1 application into the nose 2 (two) times daily. Use one-half of tube in each nostril twice daily for five (5) days. After application, press sides of nose together and gently massage.    [provider]  Omega-3 Fatty Acids (OMEGA-3 FISH OIL PO) Take 1 capsule by mouth daily.    [provider]  predniSONE (DELTASONE) 5 MG tablet Take 5 mg by mouth daily. 11/03/18   [provider]  sulfaSALAzine (AZULFIDINE) 500 MG tablet Take 500 mg by mouth daily.    [provider]      Allergies    Coreg [carvedilol] and Lorazepam    Review of Systems   Review of Systems  Physical Exam Updated Vital Signs BP (!) 172/72   Pulse 62   Temp 98 F (36.7 C)   Resp 15  Ht 5' (1.524 m)   Wt 59.4 kg   SpO2 94%   BMI 25.58 kg/m  Physical Exam Constitutional:      Comments: Alert nontoxic.  Clear mental status.  No respiratory distress at rest.  HENT:     Mouth/Throat:     Pharynx: Oropharynx is clear.  Eyes:     Extraocular Movements: Extraocular movements intact.  Cardiovascular:     Rate and Rhythm: Normal rate and regular rhythm.  Pulmonary:     Comments: No increased work of breathing.  Patient does have expiratory wheeze bilateral lower lung fields with some crackle present. Abdominal:     General: There is no distension.     Palpations: Abdomen is soft.     Tenderness: There is no abdominal tenderness. There is no guarding.  Musculoskeletal:     Comments: Patient has history of RA.  She  does have some swelling around both ankles.  She reports this is baseline for her.  1+ edema of the lower legs.  Calves nontender.  Patient has very superficial abrasion to the right knee.  Skin:    General: Skin is warm and dry.     Comments: Multiple senile ecchymoses on forearms and lower legs.  Neurological:     General: No focal deficit present.     Mental Status: She is oriented to person, place, and time.     Motor: No weakness.     Coordination: Coordination normal.  Psychiatric:        Mood and Affect: Mood normal.     ED Results / Procedures / Treatments   Labs (all labs ordered are listed, but only abnormal results are displayed) Labs Reviewed  CBC WITH DIFFERENTIAL/PLATELET - Abnormal; Notable for the following components:      Result Value   WBC 11.2 (*)    RBC 3.81 (*)    Hemoglobin 11.5 (*)    HCT 34.4 (*)    Neutro Abs 9.2 (*)    Abs Immature Granulocytes 0.22 (*)    All other components within normal limits  COMPREHENSIVE METABOLIC PANEL - Abnormal; Notable for the following components:   Glucose, Bld 114 (*)    BUN 27 (*)    Creatinine, Ser 1.23 (*)    Total Protein 6.0 (*)    Albumin 3.3 (*)    GFR, Estimated 42 (*)    All other components within normal limits  RESP PANEL BY RT-PCR (RSV, FLU A&B, COVID)  RVPGX2  LACTIC ACID, PLASMA  MAGNESIUM  PHOSPHORUS  URINALYSIS, ROUTINE W REFLEX MICROSCOPIC  BRAIN NATRIURETIC PEPTIDE  CBG MONITORING, ED  TROPONIN I (HIGH SENSITIVITY)    EKG EKG Interpretation  Date/Time:  Tuesday March 04 2022 14:51:51 EST Ventricular Rate:  69 PR Interval:  198 QRS Duration: 132 QT Interval:  460 QTC Calculation: 492 R Axis:   126 Text Interpretation: Atrial-paced rhythm with occasional Premature ventricular complexes Right axis deviation Left ventricular hypertrophy with QRS widening and repolarization abnormality ( Cornell product ) Abnormal ECG When compared with ECG of 13-Jul-2020 06:51, PREVIOUS ECG IS PRESENT  agree, PVC new since previous Confirmed by Arby Barrette [phone]) on 03/04/2022 7:58:11 PM  Radiology DG Chest 2 View  Result Date: 03/04/2022 CLINICAL DATA:  Cough and near-syncope EXAM: CHEST - 2 VIEW COMPARISON:  Chest radiograph dated 07/13/2020 FINDINGS: Lines/tubes: Left chest wall pacemaker leads project over the right atrium and ventricle. Chest: Left lower lobe confluent opacity. Pleura: No pneumothorax or pleural effusion. Heart/mediastinum: The heart  size and mediastinal contours are within normal limits. Bones: Partially imaged left shoulder arthroplasty appears intact. Degenerative changes of the shoulders bilaterally. IMPRESSION: Left lower lobe confluent opacity may represent atelectasis, aspiration, or pneumonia. Electronically Signed   By: Agustin Cree M.D.   On: 03/04/2022 16:00    Procedures Procedures    Medications Ordered in ED Medications  albuterol (VENTOLIN HFA) 108 (90 Base) MCG/ACT inhaler 2 puff (has no administration in time range)  AeroChamber Plus Flo-Vu Large MISC 1 each (has no administration in time range)  benzonatate (TESSALON) capsule 100 mg (has no administration in time range)  ipratropium-albuterol (DUONEB) 0.5-2.5 (3) MG/3ML nebulizer solution 3 mL (3 mLs Nebulization Given 03/04/22 2109)  cefTRIAXone (ROCEPHIN) 1 g in sodium chloride 0.9 % 100 mL IVPB (0 g Intravenous Stopped 03/04/22 2142)  azithromycin (ZITHROMAX) tablet 500 mg (500 mg Oral Given 03/04/22 2054)    ED Course/ Medical Decision Making/ A&P                           Medical Decision Making Amount and/or Complexity of Data Reviewed Labs: ordered.  Risk Prescription drug management.   Patient presents with a syncopal episode.  Risk factors for syncope include dysrhythmia, currently paced rythm, ACS, PE, orthostatic hypotension, history suggest possible pneumonia, viral or bacterial.  Will proceed with diagnostic evaluation including troponin, BNP and viral panel.  Chest x-ray  visually reviewed by myself and radiology review reviewed.  Small focal area of infiltrate versus atelectasis suggestive of pneumonia with LLL opacity.  With patient's history of productive cough for a week and positive opacity on chest x-ray as well as leukocytosis, will proceed with administration of Rocephin and Zithromax for community-acquired pneumonia.  Will also administer a DuoNeb therapy.  Patient does not have any lactic acidosis and troponin is normal.  At this time, with improvement with DuoNeb and oxygen saturations stable in the mid 90s, will plan for discharge with ongoing treatment for pneumonia.  Will continue Zithromax for outpatient.  Will dispense albuterol MDI for wheezing and cough.  Return precautions reviewed.  Patient is to follow-up with soon as possible with PCP for recheck.  Reviewed plan with family members at bedside.  Patient is alert and in no acute distress.  At this time stable for discharge.          Final Clinical Impression(s) / ED Diagnoses Final diagnoses:  Pneumonia of left lower lobe due to infectious organism  Syncope, unspecified syncope type    Rx / DC Orders ED Discharge Orders          Ordered    azithromycin (ZITHROMAX) 250 MG tablet  Daily        03/04/22 2320    benzonatate (TESSALON) 100 MG capsule  Every 8 hours        03/04/22 2322              Arby Barrette, MD 03/04/22 2343

## 2022-03-13 DIAGNOSIS — Z09 Encounter for follow-up examination after completed treatment for conditions other than malignant neoplasm: Secondary | ICD-10-CM | POA: Diagnosis not present

## 2022-03-13 DIAGNOSIS — R531 Weakness: Secondary | ICD-10-CM | POA: Diagnosis not present

## 2022-03-13 DIAGNOSIS — Z8701 Personal history of pneumonia (recurrent): Secondary | ICD-10-CM | POA: Diagnosis not present

## 2022-03-13 DIAGNOSIS — I10 Essential (primary) hypertension: Secondary | ICD-10-CM | POA: Diagnosis not present

## 2022-03-13 DIAGNOSIS — R052 Subacute cough: Secondary | ICD-10-CM | POA: Diagnosis not present

## 2022-03-26 DIAGNOSIS — M25562 Pain in left knee: Secondary | ICD-10-CM | POA: Diagnosis not present

## 2022-03-26 DIAGNOSIS — M1990 Unspecified osteoarthritis, unspecified site: Secondary | ICD-10-CM | POA: Diagnosis not present

## 2022-03-26 DIAGNOSIS — M810 Age-related osteoporosis without current pathological fracture: Secondary | ICD-10-CM | POA: Diagnosis not present

## 2022-03-26 DIAGNOSIS — M25461 Effusion, right knee: Secondary | ICD-10-CM | POA: Diagnosis not present

## 2022-03-26 DIAGNOSIS — Z79899 Other long term (current) drug therapy: Secondary | ICD-10-CM | POA: Diagnosis not present

## 2022-03-26 DIAGNOSIS — M0579 Rheumatoid arthritis with rheumatoid factor of multiple sites without organ or systems involvement: Secondary | ICD-10-CM | POA: Diagnosis not present

## 2022-03-26 DIAGNOSIS — M25561 Pain in right knee: Secondary | ICD-10-CM | POA: Diagnosis not present

## 2022-03-26 DIAGNOSIS — M109 Gout, unspecified: Secondary | ICD-10-CM | POA: Diagnosis not present

## 2022-03-26 DIAGNOSIS — M79643 Pain in unspecified hand: Secondary | ICD-10-CM | POA: Diagnosis not present

## 2022-03-31 DIAGNOSIS — M5136 Other intervertebral disc degeneration, lumbar region: Secondary | ICD-10-CM | POA: Diagnosis not present

## 2022-03-31 DIAGNOSIS — M109 Gout, unspecified: Secondary | ICD-10-CM | POA: Diagnosis not present

## 2022-03-31 DIAGNOSIS — J189 Pneumonia, unspecified organism: Secondary | ICD-10-CM | POA: Diagnosis not present

## 2022-03-31 DIAGNOSIS — M0579 Rheumatoid arthritis with rheumatoid factor of multiple sites without organ or systems involvement: Secondary | ICD-10-CM | POA: Diagnosis not present

## 2022-03-31 DIAGNOSIS — I429 Cardiomyopathy, unspecified: Secondary | ICD-10-CM | POA: Diagnosis not present

## 2022-03-31 DIAGNOSIS — M810 Age-related osteoporosis without current pathological fracture: Secondary | ICD-10-CM | POA: Diagnosis not present

## 2022-03-31 DIAGNOSIS — I447 Left bundle-branch block, unspecified: Secondary | ICD-10-CM | POA: Diagnosis not present

## 2022-03-31 DIAGNOSIS — M47816 Spondylosis without myelopathy or radiculopathy, lumbar region: Secondary | ICD-10-CM | POA: Diagnosis not present

## 2022-03-31 DIAGNOSIS — I10 Essential (primary) hypertension: Secondary | ICD-10-CM | POA: Diagnosis not present

## 2022-04-04 DIAGNOSIS — I429 Cardiomyopathy, unspecified: Secondary | ICD-10-CM | POA: Diagnosis not present

## 2022-04-04 DIAGNOSIS — M5136 Other intervertebral disc degeneration, lumbar region: Secondary | ICD-10-CM | POA: Diagnosis not present

## 2022-04-04 DIAGNOSIS — J189 Pneumonia, unspecified organism: Secondary | ICD-10-CM | POA: Diagnosis not present

## 2022-04-04 DIAGNOSIS — I10 Essential (primary) hypertension: Secondary | ICD-10-CM | POA: Diagnosis not present

## 2022-04-04 DIAGNOSIS — M810 Age-related osteoporosis without current pathological fracture: Secondary | ICD-10-CM | POA: Diagnosis not present

## 2022-04-04 DIAGNOSIS — I447 Left bundle-branch block, unspecified: Secondary | ICD-10-CM | POA: Diagnosis not present

## 2022-04-04 DIAGNOSIS — M109 Gout, unspecified: Secondary | ICD-10-CM | POA: Diagnosis not present

## 2022-04-04 DIAGNOSIS — M47816 Spondylosis without myelopathy or radiculopathy, lumbar region: Secondary | ICD-10-CM | POA: Diagnosis not present

## 2022-04-04 DIAGNOSIS — M0579 Rheumatoid arthritis with rheumatoid factor of multiple sites without organ or systems involvement: Secondary | ICD-10-CM | POA: Diagnosis not present

## 2022-04-09 DIAGNOSIS — M109 Gout, unspecified: Secondary | ICD-10-CM | POA: Diagnosis not present

## 2022-04-09 DIAGNOSIS — M5136 Other intervertebral disc degeneration, lumbar region: Secondary | ICD-10-CM | POA: Diagnosis not present

## 2022-04-09 DIAGNOSIS — I442 Atrioventricular block, complete: Secondary | ICD-10-CM | POA: Diagnosis not present

## 2022-04-09 DIAGNOSIS — M0579 Rheumatoid arthritis with rheumatoid factor of multiple sites without organ or systems involvement: Secondary | ICD-10-CM | POA: Diagnosis not present

## 2022-04-09 DIAGNOSIS — M810 Age-related osteoporosis without current pathological fracture: Secondary | ICD-10-CM | POA: Diagnosis not present

## 2022-04-09 DIAGNOSIS — M47816 Spondylosis without myelopathy or radiculopathy, lumbar region: Secondary | ICD-10-CM | POA: Diagnosis not present

## 2022-04-09 DIAGNOSIS — I10 Essential (primary) hypertension: Secondary | ICD-10-CM | POA: Diagnosis not present

## 2022-04-09 DIAGNOSIS — Z45018 Encounter for adjustment and management of other part of cardiac pacemaker: Secondary | ICD-10-CM | POA: Diagnosis not present

## 2022-04-09 DIAGNOSIS — J189 Pneumonia, unspecified organism: Secondary | ICD-10-CM | POA: Diagnosis not present

## 2022-04-09 DIAGNOSIS — I447 Left bundle-branch block, unspecified: Secondary | ICD-10-CM | POA: Diagnosis not present

## 2022-04-09 DIAGNOSIS — I429 Cardiomyopathy, unspecified: Secondary | ICD-10-CM | POA: Diagnosis not present

## 2022-04-10 DIAGNOSIS — Z8701 Personal history of pneumonia (recurrent): Secondary | ICD-10-CM | POA: Diagnosis not present

## 2022-04-10 DIAGNOSIS — R052 Subacute cough: Secondary | ICD-10-CM | POA: Diagnosis not present

## 2022-04-10 DIAGNOSIS — R531 Weakness: Secondary | ICD-10-CM | POA: Diagnosis not present

## 2022-04-11 DIAGNOSIS — M5136 Other intervertebral disc degeneration, lumbar region: Secondary | ICD-10-CM | POA: Diagnosis not present

## 2022-04-11 DIAGNOSIS — M810 Age-related osteoporosis without current pathological fracture: Secondary | ICD-10-CM | POA: Diagnosis not present

## 2022-04-11 DIAGNOSIS — M0579 Rheumatoid arthritis with rheumatoid factor of multiple sites without organ or systems involvement: Secondary | ICD-10-CM | POA: Diagnosis not present

## 2022-04-11 DIAGNOSIS — I10 Essential (primary) hypertension: Secondary | ICD-10-CM | POA: Diagnosis not present

## 2022-04-11 DIAGNOSIS — I447 Left bundle-branch block, unspecified: Secondary | ICD-10-CM | POA: Diagnosis not present

## 2022-04-11 DIAGNOSIS — I429 Cardiomyopathy, unspecified: Secondary | ICD-10-CM | POA: Diagnosis not present

## 2022-04-11 DIAGNOSIS — M47816 Spondylosis without myelopathy or radiculopathy, lumbar region: Secondary | ICD-10-CM | POA: Diagnosis not present

## 2022-04-11 DIAGNOSIS — J189 Pneumonia, unspecified organism: Secondary | ICD-10-CM | POA: Diagnosis not present

## 2022-04-11 DIAGNOSIS — M109 Gout, unspecified: Secondary | ICD-10-CM | POA: Diagnosis not present

## 2022-04-14 ENCOUNTER — Other Ambulatory Visit: Payer: Self-pay | Admitting: Cardiology

## 2022-04-14 DIAGNOSIS — I447 Left bundle-branch block, unspecified: Secondary | ICD-10-CM | POA: Diagnosis not present

## 2022-04-14 DIAGNOSIS — I10 Essential (primary) hypertension: Secondary | ICD-10-CM | POA: Diagnosis not present

## 2022-04-14 DIAGNOSIS — M109 Gout, unspecified: Secondary | ICD-10-CM | POA: Diagnosis not present

## 2022-04-14 DIAGNOSIS — I429 Cardiomyopathy, unspecified: Secondary | ICD-10-CM | POA: Diagnosis not present

## 2022-04-14 DIAGNOSIS — M810 Age-related osteoporosis without current pathological fracture: Secondary | ICD-10-CM | POA: Diagnosis not present

## 2022-04-14 DIAGNOSIS — M47816 Spondylosis without myelopathy or radiculopathy, lumbar region: Secondary | ICD-10-CM | POA: Diagnosis not present

## 2022-04-14 DIAGNOSIS — M0579 Rheumatoid arthritis with rheumatoid factor of multiple sites without organ or systems involvement: Secondary | ICD-10-CM | POA: Diagnosis not present

## 2022-04-14 DIAGNOSIS — M5136 Other intervertebral disc degeneration, lumbar region: Secondary | ICD-10-CM | POA: Diagnosis not present

## 2022-04-14 DIAGNOSIS — J189 Pneumonia, unspecified organism: Secondary | ICD-10-CM | POA: Diagnosis not present

## 2022-04-16 DIAGNOSIS — I429 Cardiomyopathy, unspecified: Secondary | ICD-10-CM | POA: Diagnosis not present

## 2022-04-16 DIAGNOSIS — I10 Essential (primary) hypertension: Secondary | ICD-10-CM | POA: Diagnosis not present

## 2022-04-16 DIAGNOSIS — I447 Left bundle-branch block, unspecified: Secondary | ICD-10-CM | POA: Diagnosis not present

## 2022-04-16 DIAGNOSIS — M5136 Other intervertebral disc degeneration, lumbar region: Secondary | ICD-10-CM | POA: Diagnosis not present

## 2022-04-16 DIAGNOSIS — M109 Gout, unspecified: Secondary | ICD-10-CM | POA: Diagnosis not present

## 2022-04-16 DIAGNOSIS — J189 Pneumonia, unspecified organism: Secondary | ICD-10-CM | POA: Diagnosis not present

## 2022-04-16 DIAGNOSIS — M0579 Rheumatoid arthritis with rheumatoid factor of multiple sites without organ or systems involvement: Secondary | ICD-10-CM | POA: Diagnosis not present

## 2022-04-16 DIAGNOSIS — M810 Age-related osteoporosis without current pathological fracture: Secondary | ICD-10-CM | POA: Diagnosis not present

## 2022-04-16 DIAGNOSIS — M47816 Spondylosis without myelopathy or radiculopathy, lumbar region: Secondary | ICD-10-CM | POA: Diagnosis not present

## 2022-04-18 DIAGNOSIS — J189 Pneumonia, unspecified organism: Secondary | ICD-10-CM | POA: Diagnosis not present

## 2022-04-18 DIAGNOSIS — I429 Cardiomyopathy, unspecified: Secondary | ICD-10-CM | POA: Diagnosis not present

## 2022-04-18 DIAGNOSIS — I10 Essential (primary) hypertension: Secondary | ICD-10-CM | POA: Diagnosis not present

## 2022-04-18 DIAGNOSIS — M0579 Rheumatoid arthritis with rheumatoid factor of multiple sites without organ or systems involvement: Secondary | ICD-10-CM | POA: Diagnosis not present

## 2022-04-18 DIAGNOSIS — M5136 Other intervertebral disc degeneration, lumbar region: Secondary | ICD-10-CM | POA: Diagnosis not present

## 2022-04-18 DIAGNOSIS — M47816 Spondylosis without myelopathy or radiculopathy, lumbar region: Secondary | ICD-10-CM | POA: Diagnosis not present

## 2022-04-18 DIAGNOSIS — M109 Gout, unspecified: Secondary | ICD-10-CM | POA: Diagnosis not present

## 2022-04-18 DIAGNOSIS — I447 Left bundle-branch block, unspecified: Secondary | ICD-10-CM | POA: Diagnosis not present

## 2022-04-18 DIAGNOSIS — M810 Age-related osteoporosis without current pathological fracture: Secondary | ICD-10-CM | POA: Diagnosis not present

## 2022-04-22 DIAGNOSIS — I10 Essential (primary) hypertension: Secondary | ICD-10-CM | POA: Diagnosis not present

## 2022-04-22 DIAGNOSIS — I447 Left bundle-branch block, unspecified: Secondary | ICD-10-CM | POA: Diagnosis not present

## 2022-04-22 DIAGNOSIS — I429 Cardiomyopathy, unspecified: Secondary | ICD-10-CM | POA: Diagnosis not present

## 2022-04-22 DIAGNOSIS — M5136 Other intervertebral disc degeneration, lumbar region: Secondary | ICD-10-CM | POA: Diagnosis not present

## 2022-04-22 DIAGNOSIS — M0579 Rheumatoid arthritis with rheumatoid factor of multiple sites without organ or systems involvement: Secondary | ICD-10-CM | POA: Diagnosis not present

## 2022-04-22 DIAGNOSIS — J189 Pneumonia, unspecified organism: Secondary | ICD-10-CM | POA: Diagnosis not present

## 2022-04-22 DIAGNOSIS — M810 Age-related osteoporosis without current pathological fracture: Secondary | ICD-10-CM | POA: Diagnosis not present

## 2022-04-22 DIAGNOSIS — M109 Gout, unspecified: Secondary | ICD-10-CM | POA: Diagnosis not present

## 2022-04-22 DIAGNOSIS — M47816 Spondylosis without myelopathy or radiculopathy, lumbar region: Secondary | ICD-10-CM | POA: Diagnosis not present

## 2022-04-25 DIAGNOSIS — M5136 Other intervertebral disc degeneration, lumbar region: Secondary | ICD-10-CM | POA: Diagnosis not present

## 2022-04-25 DIAGNOSIS — I10 Essential (primary) hypertension: Secondary | ICD-10-CM | POA: Diagnosis not present

## 2022-04-25 DIAGNOSIS — I447 Left bundle-branch block, unspecified: Secondary | ICD-10-CM | POA: Diagnosis not present

## 2022-04-25 DIAGNOSIS — I429 Cardiomyopathy, unspecified: Secondary | ICD-10-CM | POA: Diagnosis not present

## 2022-04-25 DIAGNOSIS — M109 Gout, unspecified: Secondary | ICD-10-CM | POA: Diagnosis not present

## 2022-04-25 DIAGNOSIS — M47816 Spondylosis without myelopathy or radiculopathy, lumbar region: Secondary | ICD-10-CM | POA: Diagnosis not present

## 2022-04-25 DIAGNOSIS — M0579 Rheumatoid arthritis with rheumatoid factor of multiple sites without organ or systems involvement: Secondary | ICD-10-CM | POA: Diagnosis not present

## 2022-04-25 DIAGNOSIS — M810 Age-related osteoporosis without current pathological fracture: Secondary | ICD-10-CM | POA: Diagnosis not present

## 2022-04-25 DIAGNOSIS — J189 Pneumonia, unspecified organism: Secondary | ICD-10-CM | POA: Diagnosis not present

## 2022-04-28 DIAGNOSIS — I10 Essential (primary) hypertension: Secondary | ICD-10-CM | POA: Diagnosis not present

## 2022-04-28 DIAGNOSIS — M5136 Other intervertebral disc degeneration, lumbar region: Secondary | ICD-10-CM | POA: Diagnosis not present

## 2022-04-28 DIAGNOSIS — M109 Gout, unspecified: Secondary | ICD-10-CM | POA: Diagnosis not present

## 2022-04-28 DIAGNOSIS — M47816 Spondylosis without myelopathy or radiculopathy, lumbar region: Secondary | ICD-10-CM | POA: Diagnosis not present

## 2022-04-28 DIAGNOSIS — J189 Pneumonia, unspecified organism: Secondary | ICD-10-CM | POA: Diagnosis not present

## 2022-04-28 DIAGNOSIS — I447 Left bundle-branch block, unspecified: Secondary | ICD-10-CM | POA: Diagnosis not present

## 2022-04-28 DIAGNOSIS — M0579 Rheumatoid arthritis with rheumatoid factor of multiple sites without organ or systems involvement: Secondary | ICD-10-CM | POA: Diagnosis not present

## 2022-04-28 DIAGNOSIS — I429 Cardiomyopathy, unspecified: Secondary | ICD-10-CM | POA: Diagnosis not present

## 2022-04-28 DIAGNOSIS — M810 Age-related osteoporosis without current pathological fracture: Secondary | ICD-10-CM | POA: Diagnosis not present

## 2022-04-30 DIAGNOSIS — M810 Age-related osteoporosis without current pathological fracture: Secondary | ICD-10-CM | POA: Diagnosis not present

## 2022-04-30 DIAGNOSIS — M109 Gout, unspecified: Secondary | ICD-10-CM | POA: Diagnosis not present

## 2022-04-30 DIAGNOSIS — M0579 Rheumatoid arthritis with rheumatoid factor of multiple sites without organ or systems involvement: Secondary | ICD-10-CM | POA: Diagnosis not present

## 2022-04-30 DIAGNOSIS — J189 Pneumonia, unspecified organism: Secondary | ICD-10-CM | POA: Diagnosis not present

## 2022-04-30 DIAGNOSIS — M5136 Other intervertebral disc degeneration, lumbar region: Secondary | ICD-10-CM | POA: Diagnosis not present

## 2022-04-30 DIAGNOSIS — I447 Left bundle-branch block, unspecified: Secondary | ICD-10-CM | POA: Diagnosis not present

## 2022-04-30 DIAGNOSIS — M47816 Spondylosis without myelopathy or radiculopathy, lumbar region: Secondary | ICD-10-CM | POA: Diagnosis not present

## 2022-04-30 DIAGNOSIS — I429 Cardiomyopathy, unspecified: Secondary | ICD-10-CM | POA: Diagnosis not present

## 2022-04-30 DIAGNOSIS — I10 Essential (primary) hypertension: Secondary | ICD-10-CM | POA: Diagnosis not present

## 2022-05-02 DIAGNOSIS — J189 Pneumonia, unspecified organism: Secondary | ICD-10-CM | POA: Diagnosis not present

## 2022-05-02 DIAGNOSIS — M0579 Rheumatoid arthritis with rheumatoid factor of multiple sites without organ or systems involvement: Secondary | ICD-10-CM | POA: Diagnosis not present

## 2022-05-02 DIAGNOSIS — M47816 Spondylosis without myelopathy or radiculopathy, lumbar region: Secondary | ICD-10-CM | POA: Diagnosis not present

## 2022-05-02 DIAGNOSIS — I447 Left bundle-branch block, unspecified: Secondary | ICD-10-CM | POA: Diagnosis not present

## 2022-05-02 DIAGNOSIS — M810 Age-related osteoporosis without current pathological fracture: Secondary | ICD-10-CM | POA: Diagnosis not present

## 2022-05-02 DIAGNOSIS — M109 Gout, unspecified: Secondary | ICD-10-CM | POA: Diagnosis not present

## 2022-05-02 DIAGNOSIS — I429 Cardiomyopathy, unspecified: Secondary | ICD-10-CM | POA: Diagnosis not present

## 2022-05-02 DIAGNOSIS — M5136 Other intervertebral disc degeneration, lumbar region: Secondary | ICD-10-CM | POA: Diagnosis not present

## 2022-05-02 DIAGNOSIS — I10 Essential (primary) hypertension: Secondary | ICD-10-CM | POA: Diagnosis not present

## 2022-05-06 DIAGNOSIS — J189 Pneumonia, unspecified organism: Secondary | ICD-10-CM | POA: Diagnosis not present

## 2022-05-06 DIAGNOSIS — I429 Cardiomyopathy, unspecified: Secondary | ICD-10-CM | POA: Diagnosis not present

## 2022-05-06 DIAGNOSIS — M810 Age-related osteoporosis without current pathological fracture: Secondary | ICD-10-CM | POA: Diagnosis not present

## 2022-05-06 DIAGNOSIS — M0579 Rheumatoid arthritis with rheumatoid factor of multiple sites without organ or systems involvement: Secondary | ICD-10-CM | POA: Diagnosis not present

## 2022-05-06 DIAGNOSIS — M47816 Spondylosis without myelopathy or radiculopathy, lumbar region: Secondary | ICD-10-CM | POA: Diagnosis not present

## 2022-05-06 DIAGNOSIS — M109 Gout, unspecified: Secondary | ICD-10-CM | POA: Diagnosis not present

## 2022-05-06 DIAGNOSIS — M5136 Other intervertebral disc degeneration, lumbar region: Secondary | ICD-10-CM | POA: Diagnosis not present

## 2022-05-06 DIAGNOSIS — I10 Essential (primary) hypertension: Secondary | ICD-10-CM | POA: Diagnosis not present

## 2022-05-06 DIAGNOSIS — I447 Left bundle-branch block, unspecified: Secondary | ICD-10-CM | POA: Diagnosis not present

## 2022-05-08 DIAGNOSIS — R052 Subacute cough: Secondary | ICD-10-CM | POA: Diagnosis not present

## 2022-05-08 DIAGNOSIS — Z8701 Personal history of pneumonia (recurrent): Secondary | ICD-10-CM | POA: Diagnosis not present

## 2022-05-08 DIAGNOSIS — R200 Anesthesia of skin: Secondary | ICD-10-CM | POA: Diagnosis not present

## 2022-05-08 DIAGNOSIS — R531 Weakness: Secondary | ICD-10-CM | POA: Diagnosis not present

## 2022-05-09 DIAGNOSIS — M47816 Spondylosis without myelopathy or radiculopathy, lumbar region: Secondary | ICD-10-CM | POA: Diagnosis not present

## 2022-05-09 DIAGNOSIS — M109 Gout, unspecified: Secondary | ICD-10-CM | POA: Diagnosis not present

## 2022-05-09 DIAGNOSIS — I10 Essential (primary) hypertension: Secondary | ICD-10-CM | POA: Diagnosis not present

## 2022-05-09 DIAGNOSIS — J189 Pneumonia, unspecified organism: Secondary | ICD-10-CM | POA: Diagnosis not present

## 2022-05-09 DIAGNOSIS — I447 Left bundle-branch block, unspecified: Secondary | ICD-10-CM | POA: Diagnosis not present

## 2022-05-09 DIAGNOSIS — M5136 Other intervertebral disc degeneration, lumbar region: Secondary | ICD-10-CM | POA: Diagnosis not present

## 2022-05-09 DIAGNOSIS — I429 Cardiomyopathy, unspecified: Secondary | ICD-10-CM | POA: Diagnosis not present

## 2022-05-09 DIAGNOSIS — M0579 Rheumatoid arthritis with rheumatoid factor of multiple sites without organ or systems involvement: Secondary | ICD-10-CM | POA: Diagnosis not present

## 2022-05-09 DIAGNOSIS — M810 Age-related osteoporosis without current pathological fracture: Secondary | ICD-10-CM | POA: Diagnosis not present

## 2022-05-15 DIAGNOSIS — M0579 Rheumatoid arthritis with rheumatoid factor of multiple sites without organ or systems involvement: Secondary | ICD-10-CM | POA: Diagnosis not present

## 2022-05-15 DIAGNOSIS — I10 Essential (primary) hypertension: Secondary | ICD-10-CM | POA: Diagnosis not present

## 2022-05-15 DIAGNOSIS — M109 Gout, unspecified: Secondary | ICD-10-CM | POA: Diagnosis not present

## 2022-05-15 DIAGNOSIS — I429 Cardiomyopathy, unspecified: Secondary | ICD-10-CM | POA: Diagnosis not present

## 2022-05-15 DIAGNOSIS — M5136 Other intervertebral disc degeneration, lumbar region: Secondary | ICD-10-CM | POA: Diagnosis not present

## 2022-05-15 DIAGNOSIS — I447 Left bundle-branch block, unspecified: Secondary | ICD-10-CM | POA: Diagnosis not present

## 2022-05-15 DIAGNOSIS — M47816 Spondylosis without myelopathy or radiculopathy, lumbar region: Secondary | ICD-10-CM | POA: Diagnosis not present

## 2022-05-15 DIAGNOSIS — J189 Pneumonia, unspecified organism: Secondary | ICD-10-CM | POA: Diagnosis not present

## 2022-05-15 DIAGNOSIS — M810 Age-related osteoporosis without current pathological fracture: Secondary | ICD-10-CM | POA: Diagnosis not present

## 2022-05-16 DIAGNOSIS — M109 Gout, unspecified: Secondary | ICD-10-CM | POA: Diagnosis not present

## 2022-05-16 DIAGNOSIS — I10 Essential (primary) hypertension: Secondary | ICD-10-CM | POA: Diagnosis not present

## 2022-05-16 DIAGNOSIS — J189 Pneumonia, unspecified organism: Secondary | ICD-10-CM | POA: Diagnosis not present

## 2022-05-16 DIAGNOSIS — M810 Age-related osteoporosis without current pathological fracture: Secondary | ICD-10-CM | POA: Diagnosis not present

## 2022-05-16 DIAGNOSIS — M47816 Spondylosis without myelopathy or radiculopathy, lumbar region: Secondary | ICD-10-CM | POA: Diagnosis not present

## 2022-05-16 DIAGNOSIS — I429 Cardiomyopathy, unspecified: Secondary | ICD-10-CM | POA: Diagnosis not present

## 2022-05-16 DIAGNOSIS — I447 Left bundle-branch block, unspecified: Secondary | ICD-10-CM | POA: Diagnosis not present

## 2022-05-16 DIAGNOSIS — M0579 Rheumatoid arthritis with rheumatoid factor of multiple sites without organ or systems involvement: Secondary | ICD-10-CM | POA: Diagnosis not present

## 2022-05-16 DIAGNOSIS — M5136 Other intervertebral disc degeneration, lumbar region: Secondary | ICD-10-CM | POA: Diagnosis not present

## 2022-05-20 DIAGNOSIS — M47816 Spondylosis without myelopathy or radiculopathy, lumbar region: Secondary | ICD-10-CM | POA: Diagnosis not present

## 2022-05-20 DIAGNOSIS — M5136 Other intervertebral disc degeneration, lumbar region: Secondary | ICD-10-CM | POA: Diagnosis not present

## 2022-05-20 DIAGNOSIS — I10 Essential (primary) hypertension: Secondary | ICD-10-CM | POA: Diagnosis not present

## 2022-05-20 DIAGNOSIS — I447 Left bundle-branch block, unspecified: Secondary | ICD-10-CM | POA: Diagnosis not present

## 2022-05-20 DIAGNOSIS — M0579 Rheumatoid arthritis with rheumatoid factor of multiple sites without organ or systems involvement: Secondary | ICD-10-CM | POA: Diagnosis not present

## 2022-05-20 DIAGNOSIS — M810 Age-related osteoporosis without current pathological fracture: Secondary | ICD-10-CM | POA: Diagnosis not present

## 2022-05-20 DIAGNOSIS — I429 Cardiomyopathy, unspecified: Secondary | ICD-10-CM | POA: Diagnosis not present

## 2022-05-20 DIAGNOSIS — M109 Gout, unspecified: Secondary | ICD-10-CM | POA: Diagnosis not present

## 2022-05-20 DIAGNOSIS — J189 Pneumonia, unspecified organism: Secondary | ICD-10-CM | POA: Diagnosis not present

## 2022-05-23 DIAGNOSIS — M47816 Spondylosis without myelopathy or radiculopathy, lumbar region: Secondary | ICD-10-CM | POA: Diagnosis not present

## 2022-05-23 DIAGNOSIS — I429 Cardiomyopathy, unspecified: Secondary | ICD-10-CM | POA: Diagnosis not present

## 2022-05-23 DIAGNOSIS — M810 Age-related osteoporosis without current pathological fracture: Secondary | ICD-10-CM | POA: Diagnosis not present

## 2022-05-23 DIAGNOSIS — I447 Left bundle-branch block, unspecified: Secondary | ICD-10-CM | POA: Diagnosis not present

## 2022-05-23 DIAGNOSIS — J189 Pneumonia, unspecified organism: Secondary | ICD-10-CM | POA: Diagnosis not present

## 2022-05-23 DIAGNOSIS — M0579 Rheumatoid arthritis with rheumatoid factor of multiple sites without organ or systems involvement: Secondary | ICD-10-CM | POA: Diagnosis not present

## 2022-05-23 DIAGNOSIS — M109 Gout, unspecified: Secondary | ICD-10-CM | POA: Diagnosis not present

## 2022-05-23 DIAGNOSIS — M5136 Other intervertebral disc degeneration, lumbar region: Secondary | ICD-10-CM | POA: Diagnosis not present

## 2022-05-23 DIAGNOSIS — I10 Essential (primary) hypertension: Secondary | ICD-10-CM | POA: Diagnosis not present

## 2022-05-26 DIAGNOSIS — M109 Gout, unspecified: Secondary | ICD-10-CM | POA: Diagnosis not present

## 2022-05-26 DIAGNOSIS — I447 Left bundle-branch block, unspecified: Secondary | ICD-10-CM | POA: Diagnosis not present

## 2022-05-26 DIAGNOSIS — I429 Cardiomyopathy, unspecified: Secondary | ICD-10-CM | POA: Diagnosis not present

## 2022-05-26 DIAGNOSIS — M5136 Other intervertebral disc degeneration, lumbar region: Secondary | ICD-10-CM | POA: Diagnosis not present

## 2022-05-26 DIAGNOSIS — M810 Age-related osteoporosis without current pathological fracture: Secondary | ICD-10-CM | POA: Diagnosis not present

## 2022-05-26 DIAGNOSIS — I10 Essential (primary) hypertension: Secondary | ICD-10-CM | POA: Diagnosis not present

## 2022-05-26 DIAGNOSIS — J189 Pneumonia, unspecified organism: Secondary | ICD-10-CM | POA: Diagnosis not present

## 2022-05-26 DIAGNOSIS — M47816 Spondylosis without myelopathy or radiculopathy, lumbar region: Secondary | ICD-10-CM | POA: Diagnosis not present

## 2022-05-26 DIAGNOSIS — M0579 Rheumatoid arthritis with rheumatoid factor of multiple sites without organ or systems involvement: Secondary | ICD-10-CM | POA: Diagnosis not present

## 2022-05-28 DIAGNOSIS — J189 Pneumonia, unspecified organism: Secondary | ICD-10-CM | POA: Diagnosis not present

## 2022-05-28 DIAGNOSIS — I447 Left bundle-branch block, unspecified: Secondary | ICD-10-CM | POA: Diagnosis not present

## 2022-05-28 DIAGNOSIS — I429 Cardiomyopathy, unspecified: Secondary | ICD-10-CM | POA: Diagnosis not present

## 2022-05-28 DIAGNOSIS — M5136 Other intervertebral disc degeneration, lumbar region: Secondary | ICD-10-CM | POA: Diagnosis not present

## 2022-05-28 DIAGNOSIS — M47816 Spondylosis without myelopathy or radiculopathy, lumbar region: Secondary | ICD-10-CM | POA: Diagnosis not present

## 2022-05-28 DIAGNOSIS — M810 Age-related osteoporosis without current pathological fracture: Secondary | ICD-10-CM | POA: Diagnosis not present

## 2022-05-28 DIAGNOSIS — I10 Essential (primary) hypertension: Secondary | ICD-10-CM | POA: Diagnosis not present

## 2022-05-28 DIAGNOSIS — M109 Gout, unspecified: Secondary | ICD-10-CM | POA: Diagnosis not present

## 2022-05-28 DIAGNOSIS — M0579 Rheumatoid arthritis with rheumatoid factor of multiple sites without organ or systems involvement: Secondary | ICD-10-CM | POA: Diagnosis not present

## 2022-07-09 DIAGNOSIS — Z45018 Encounter for adjustment and management of other part of cardiac pacemaker: Secondary | ICD-10-CM | POA: Diagnosis not present

## 2022-07-09 DIAGNOSIS — I442 Atrioventricular block, complete: Secondary | ICD-10-CM | POA: Diagnosis not present

## 2022-07-14 ENCOUNTER — Other Ambulatory Visit: Payer: Self-pay | Admitting: Cardiology

## 2022-07-28 DIAGNOSIS — I10 Essential (primary) hypertension: Secondary | ICD-10-CM | POA: Diagnosis not present

## 2022-07-28 DIAGNOSIS — E039 Hypothyroidism, unspecified: Secondary | ICD-10-CM | POA: Diagnosis not present

## 2022-07-28 DIAGNOSIS — N3000 Acute cystitis without hematuria: Secondary | ICD-10-CM | POA: Diagnosis not present

## 2022-07-28 DIAGNOSIS — E785 Hyperlipidemia, unspecified: Secondary | ICD-10-CM | POA: Diagnosis not present

## 2022-08-05 DIAGNOSIS — I10 Essential (primary) hypertension: Secondary | ICD-10-CM | POA: Diagnosis not present

## 2022-08-05 DIAGNOSIS — E039 Hypothyroidism, unspecified: Secondary | ICD-10-CM | POA: Diagnosis not present

## 2022-08-05 DIAGNOSIS — I700 Atherosclerosis of aorta: Secondary | ICD-10-CM | POA: Diagnosis not present

## 2022-08-05 DIAGNOSIS — E785 Hyperlipidemia, unspecified: Secondary | ICD-10-CM | POA: Diagnosis not present

## 2022-08-05 DIAGNOSIS — I429 Cardiomyopathy, unspecified: Secondary | ICD-10-CM | POA: Diagnosis not present

## 2022-08-05 DIAGNOSIS — Z0000 Encounter for general adult medical examination without abnormal findings: Secondary | ICD-10-CM | POA: Diagnosis not present

## 2022-08-05 DIAGNOSIS — Z5309 Procedure and treatment not carried out because of other contraindication: Secondary | ICD-10-CM | POA: Diagnosis not present

## 2022-08-05 DIAGNOSIS — K219 Gastro-esophageal reflux disease without esophagitis: Secondary | ICD-10-CM | POA: Diagnosis not present

## 2022-08-05 DIAGNOSIS — M059 Rheumatoid arthritis with rheumatoid factor, unspecified: Secondary | ICD-10-CM | POA: Diagnosis not present

## 2022-08-05 DIAGNOSIS — D72828 Other elevated white blood cell count: Secondary | ICD-10-CM | POA: Diagnosis not present

## 2022-08-05 DIAGNOSIS — R5383 Other fatigue: Secondary | ICD-10-CM | POA: Diagnosis not present

## 2022-08-12 DIAGNOSIS — R2681 Unsteadiness on feet: Secondary | ICD-10-CM | POA: Diagnosis not present

## 2022-08-12 DIAGNOSIS — E785 Hyperlipidemia, unspecified: Secondary | ICD-10-CM | POA: Diagnosis not present

## 2022-08-12 DIAGNOSIS — Z7952 Long term (current) use of systemic steroids: Secondary | ICD-10-CM | POA: Diagnosis not present

## 2022-08-12 DIAGNOSIS — M1030 Gout due to renal impairment, unspecified site: Secondary | ICD-10-CM | POA: Diagnosis not present

## 2022-08-12 DIAGNOSIS — Z9181 History of falling: Secondary | ICD-10-CM | POA: Diagnosis not present

## 2022-08-12 DIAGNOSIS — H3530 Unspecified macular degeneration: Secondary | ICD-10-CM | POA: Diagnosis not present

## 2022-08-12 DIAGNOSIS — I2510 Atherosclerotic heart disease of native coronary artery without angina pectoris: Secondary | ICD-10-CM | POA: Diagnosis not present

## 2022-08-12 DIAGNOSIS — R32 Unspecified urinary incontinence: Secondary | ICD-10-CM | POA: Diagnosis not present

## 2022-08-12 DIAGNOSIS — M8580 Other specified disorders of bone density and structure, unspecified site: Secondary | ICD-10-CM | POA: Diagnosis not present

## 2022-08-12 DIAGNOSIS — E039 Hypothyroidism, unspecified: Secondary | ICD-10-CM | POA: Diagnosis not present

## 2022-08-12 DIAGNOSIS — Z809 Family history of malignant neoplasm, unspecified: Secondary | ICD-10-CM | POA: Diagnosis not present

## 2022-08-12 DIAGNOSIS — I739 Peripheral vascular disease, unspecified: Secondary | ICD-10-CM | POA: Diagnosis not present

## 2022-08-12 DIAGNOSIS — I501 Left ventricular failure: Secondary | ICD-10-CM | POA: Diagnosis not present

## 2022-08-12 DIAGNOSIS — Z811 Family history of alcohol abuse and dependence: Secondary | ICD-10-CM | POA: Diagnosis not present

## 2022-08-12 DIAGNOSIS — Z950 Presence of cardiac pacemaker: Secondary | ICD-10-CM | POA: Diagnosis not present

## 2022-08-12 DIAGNOSIS — Z818 Family history of other mental and behavioral disorders: Secondary | ICD-10-CM | POA: Diagnosis not present

## 2022-08-12 DIAGNOSIS — K5900 Constipation, unspecified: Secondary | ICD-10-CM | POA: Diagnosis not present

## 2022-08-12 DIAGNOSIS — J302 Other seasonal allergic rhinitis: Secondary | ICD-10-CM | POA: Diagnosis not present

## 2022-08-13 DIAGNOSIS — H04222 Epiphora due to insufficient drainage, left lacrimal gland: Secondary | ICD-10-CM | POA: Diagnosis not present

## 2022-08-13 DIAGNOSIS — H04552 Acquired stenosis of left nasolacrimal duct: Secondary | ICD-10-CM | POA: Diagnosis not present

## 2022-08-21 ENCOUNTER — Other Ambulatory Visit: Payer: Self-pay | Admitting: Cardiology

## 2022-08-21 DIAGNOSIS — I5022 Chronic systolic (congestive) heart failure: Secondary | ICD-10-CM

## 2022-09-01 DIAGNOSIS — R110 Nausea: Secondary | ICD-10-CM | POA: Diagnosis not present

## 2022-09-01 DIAGNOSIS — M7918 Myalgia, other site: Secondary | ICD-10-CM | POA: Diagnosis not present

## 2022-09-04 ENCOUNTER — Other Ambulatory Visit: Payer: Self-pay

## 2022-09-04 ENCOUNTER — Emergency Department (HOSPITAL_BASED_OUTPATIENT_CLINIC_OR_DEPARTMENT_OTHER): Payer: Medicare PPO

## 2022-09-04 ENCOUNTER — Emergency Department (HOSPITAL_BASED_OUTPATIENT_CLINIC_OR_DEPARTMENT_OTHER)
Admission: EM | Admit: 2022-09-04 | Discharge: 2022-09-04 | Disposition: A | Discharge status: Home / Self Care | Payer: Medicare PPO | Attending: Emergency Medicine | Admitting: Emergency Medicine

## 2022-09-04 ENCOUNTER — Encounter (HOSPITAL_BASED_OUTPATIENT_CLINIC_OR_DEPARTMENT_OTHER): Payer: Self-pay | Admitting: Emergency Medicine

## 2022-09-04 DIAGNOSIS — M5416 Radiculopathy, lumbar region: Secondary | ICD-10-CM | POA: Diagnosis not present

## 2022-09-04 DIAGNOSIS — M8580 Other specified disorders of bone density and structure, unspecified site: Secondary | ICD-10-CM | POA: Diagnosis not present

## 2022-09-04 DIAGNOSIS — N3000 Acute cystitis without hematuria: Secondary | ICD-10-CM | POA: Diagnosis not present

## 2022-09-04 DIAGNOSIS — M48061 Spinal stenosis, lumbar region without neurogenic claudication: Secondary | ICD-10-CM | POA: Diagnosis not present

## 2022-09-04 DIAGNOSIS — M1611 Unilateral primary osteoarthritis, right hip: Secondary | ICD-10-CM | POA: Diagnosis not present

## 2022-09-04 DIAGNOSIS — K449 Diaphragmatic hernia without obstruction or gangrene: Secondary | ICD-10-CM | POA: Diagnosis not present

## 2022-09-04 DIAGNOSIS — M25551 Pain in right hip: Secondary | ICD-10-CM | POA: Diagnosis not present

## 2022-09-04 DIAGNOSIS — Z7982 Long term (current) use of aspirin: Secondary | ICD-10-CM | POA: Diagnosis not present

## 2022-09-04 DIAGNOSIS — M545 Low back pain, unspecified: Secondary | ICD-10-CM | POA: Diagnosis not present

## 2022-09-04 DIAGNOSIS — M47816 Spondylosis without myelopathy or radiculopathy, lumbar region: Secondary | ICD-10-CM | POA: Diagnosis not present

## 2022-09-04 DIAGNOSIS — M4316 Spondylolisthesis, lumbar region: Secondary | ICD-10-CM | POA: Diagnosis not present

## 2022-09-04 DIAGNOSIS — N281 Cyst of kidney, acquired: Secondary | ICD-10-CM | POA: Diagnosis not present

## 2022-09-04 LAB — URINALYSIS, ROUTINE W REFLEX MICROSCOPIC
Bilirubin Urine: NEGATIVE
Glucose, UA: NEGATIVE mg/dL
Hgb urine dipstick: NEGATIVE
Ketones, ur: NEGATIVE mg/dL
Nitrite: POSITIVE — AB
Protein, ur: NEGATIVE mg/dL
Specific Gravity, Urine: 1.01 (ref 1.005–1.030)
pH: 6.5 (ref 5.0–8.0)

## 2022-09-04 LAB — CBC WITH DIFFERENTIAL/PLATELET
Abs Immature Granulocytes: 0.06 10*3/uL (ref 0.00–0.07)
Basophils Absolute: 0.1 10*3/uL (ref 0.0–0.1)
Basophils Relative: 1 %
Eosinophils Absolute: 0.3 10*3/uL (ref 0.0–0.5)
Eosinophils Relative: 3 %
HCT: 36.1 % (ref 36.0–46.0)
Hemoglobin: 12.1 g/dL (ref 12.0–15.0)
Immature Granulocytes: 1 %
Lymphocytes Relative: 13 %
Lymphs Abs: 1.2 10*3/uL (ref 0.7–4.0)
MCH: 30.6 pg (ref 26.0–34.0)
MCHC: 33.5 g/dL (ref 30.0–36.0)
MCV: 91.4 fL (ref 80.0–100.0)
Monocytes Absolute: 0.7 10*3/uL (ref 0.1–1.0)
Monocytes Relative: 7 %
Neutro Abs: 6.8 10*3/uL (ref 1.7–7.7)
Neutrophils Relative %: 75 %
Platelets: 263 10*3/uL (ref 150–400)
RBC: 3.95 MIL/uL (ref 3.87–5.11)
RDW: 15.4 % (ref 11.5–15.5)
WBC: 9 10*3/uL (ref 4.0–10.5)
nRBC: 0 % (ref 0.0–0.2)

## 2022-09-04 LAB — COMPREHENSIVE METABOLIC PANEL
ALT: 12 U/L (ref 0–44)
AST: 22 U/L (ref 15–41)
Albumin: 3.3 g/dL — ABNORMAL LOW (ref 3.5–5.0)
Alkaline Phosphatase: 43 U/L (ref 38–126)
Anion gap: 9 (ref 5–15)
BUN: 23 mg/dL (ref 8–23)
CO2: 26 mmol/L (ref 22–32)
Calcium: 8.6 mg/dL — ABNORMAL LOW (ref 8.9–10.3)
Chloride: 96 mmol/L — ABNORMAL LOW (ref 98–111)
Creatinine, Ser: 1.08 mg/dL — ABNORMAL HIGH (ref 0.44–1.00)
GFR, Estimated: 49 mL/min — ABNORMAL LOW (ref >60)
Glucose, Bld: 101 mg/dL — ABNORMAL HIGH (ref 70–99)
Potassium: 3.8 mmol/L (ref 3.5–5.1)
Sodium: 131 mmol/L — ABNORMAL LOW (ref 135–145)
Total Bilirubin: 0.5 mg/dL (ref 0.3–1.2)
Total Protein: 5.8 g/dL — ABNORMAL LOW (ref 6.5–8.1)

## 2022-09-04 LAB — URINALYSIS, MICROSCOPIC (REFLEX)

## 2022-09-04 LAB — LIPASE, BLOOD: Lipase: 46 U/L (ref 11–51)

## 2022-09-04 MED ORDER — IOHEXOL 300 MG/ML  SOLN
100.0000 mL | Freq: Once | INTRAMUSCULAR | Status: AC | PRN
  Administered 2022-09-04: 80 mL via INTRAVENOUS

## 2022-09-04 MED ORDER — KETOROLAC TROMETHAMINE 15 MG/ML IJ SOLN
15.0000 mg | Freq: Once | INTRAMUSCULAR | Status: AC
  Administered 2022-09-04: 15 mg via INTRAVENOUS
  Filled 2022-09-04: qty 1

## 2022-09-04 MED ORDER — CEPHALEXIN 500 MG PO CAPS: 500.0000 mg | ORAL_CAPSULE | Freq: Three times a day (TID) | ORAL | 0 refills | Status: AC

## 2022-09-04 MED ORDER — SODIUM CHLORIDE 0.9 % IV SOLN
1.0000 g | Freq: Once | INTRAVENOUS | Status: AC
  Administered 2022-09-04: 1 g via INTRAVENOUS
  Filled 2022-09-04: qty 10

## 2022-09-04 MED ORDER — IBUPROFEN 400 MG PO TABS: 400.0000 mg | ORAL_TABLET | Freq: Three times a day (TID) | ORAL | 0 refills | Status: DC | PRN

## 2022-09-04 NOTE — ED Notes (Signed)
Pt. Son at bedside.  RN Earlene Plater went over Pt. Discharge with Pt. And Pt. Son.  Pt. In no distress.

## 2022-09-04 NOTE — ED Notes (Signed)
Pt. Reports R hip pain with history of RA.  Pt. Has bruising noted on her R arm from BP cuff.  RN took cuff off of Pt. And will take BP as needed.  Pt. B/P is WNL.

## 2022-09-04 NOTE — ED Provider Notes (Signed)
[facility] Provider Note   CSN: [id number] Arrival date & time: 09/04/22  1142     History  Chief Complaint  Patient presents with   Hip Pain    Pamela Dunn is a 89 y.o. female presented to ED with several complaints.  She is also here with her son.  The patient reports that she began having pain in her right lower back and her right hip last Wednesday, 8 days ago.  The pain is worse when she is standing and walking.  The pain is constant.  She has never had this issue before.  She denies any recent falls or injuries, but reports she had a fall 6 months ago in December onto her right side, although she was not having this persistent pain since then.  She went to see her PCP 3 days ago, who prescribed her muscle relaxer and prednisone for presumed "back or hip issues".  She said these medications have not really helped with her pain.  She was also prescribed Zofran as she did feeling nauseated for several days.  She reports no bowel movement now in 3 days which is unusual.  She is passing gas.  She does report a history of a bowel obstruction in the past, history of lysis of adhesions surgery, and an appendectomy.  HPI     Home Medications Prior to Admission medications   Medication Sig Start Date End Date Taking? Authorizing Provider  cephALEXin (KEFLEX) 500 MG capsule Take 1 capsule (500 mg total) by mouth 3 (three) times daily for 9 days. 09/05/22 09/14/22 Yes Adhrit Krenz, Kermit Balo, MD  ibuprofen (ADVIL) 400 MG tablet Take 1 tablet (400 mg total) by mouth every 8 (eight) hours as needed for up to 15 doses for mild pain or moderate pain. 09/04/22  Yes Virgil Slinger, Kermit Balo, MD  Aspirin-Caffeine 500-32.5 MG TABS Take 1 tablet by mouth daily as needed (pain).     [provider]  azithromycin (ZITHROMAX) 250 MG tablet Take 1 tablet (250 mg total) by mouth daily. 03/04/22   Arby Barrette, MD  benzonatate (TESSALON) 100 MG capsule Take 1  capsule (100 mg total) by mouth every 8 (eight) hours. 03/04/22   Arby Barrette, MD  cholecalciferol (VITAMIN D) 1000 UNITS tablet Take 1,000 Units by mouth daily.     [provider]  Coenzyme Q10 (COQ10) 200 MG CAPS Take 200 mg by mouth daily.    [provider]  ezetimibe (ZETIA) 10 MG tablet Take 10 mg by mouth daily.     [provider]  isosorbide-hydrALAZINE (BIDIL) 20-37.5 MG tablet TAKE 1 TABLET THREE TIMES DAILY 08/21/22   Yates Decamp, MD  levothyroxine (SYNTHROID) 25 MCG tablet Take 25 mcg by mouth daily.     [provider]  metoprolol succinate (TOPROL-XL) 25 MG 24 hr tablet TAKE 1 TABLET (25 MG TOTAL) BY MOUTH DAILY. 07/14/22   Yates Decamp, MD  mupirocin nasal ointment (BACTROBAN) 2 % Place 1 application into the nose 2 (two) times daily. Use one-half of tube in each nostril twice daily for five (5) days. After application, press sides of nose together and gently massage.    [provider]  Omega-3 Fatty Acids (OMEGA-3 FISH OIL PO) Take 1 capsule by mouth daily.    [provider]  predniSONE (DELTASONE) 5 MG tablet Take 5 mg by mouth daily. 11/03/18   [provider]  sulfaSALAzine (AZULFIDINE) 500 MG tablet Take 500 mg by  mouth daily.    [provider]      Allergies    Coreg [carvedilol] and Lorazepam    Review of Systems   Review of Systems  Physical Exam Updated Vital Signs BP (!) 172/69 (BP Location: Right Arm)   Pulse 60   Temp 98 F (36.7 C) (Oral)   Resp 16   Ht 4\' 9"  (1.448 m)   Wt 67.1 kg   SpO2 96%   BMI 32.03 kg/m  Physical Exam Constitutional:      General: She is not in acute distress. HENT:     Head: Normocephalic and atraumatic.  Eyes:     Conjunctiva/sclera: Conjunctivae normal.     Pupils: Pupils are equal, round, and reactive to light.  Cardiovascular:     Rate and Rhythm: Normal rate and regular rhythm.  Pulmonary:     Effort: Pulmonary effort is normal. No respiratory  distress.  Abdominal:     General: There is no distension.     Tenderness: There is no abdominal tenderness.  Musculoskeletal:     Comments: No pelvic instability. Patient does have reproducible pain near the right hip with active extension of the right lower extremity; she also has tenderness near the right iliac crest and L spine midline tenderness  Skin:    General: Skin is warm and dry.  Neurological:     General: No focal deficit present.     Mental Status: She is alert and oriented to person, place, and time. Mental status is at baseline.  Psychiatric:        Mood and Affect: Mood normal.        Behavior: Behavior normal.     ED Results / Procedures / Treatments   Labs (all labs ordered are listed, but only abnormal results are displayed) Labs Reviewed  COMPREHENSIVE METABOLIC PANEL - Abnormal; Notable for the following components:      Result Value   Sodium 131 (*)    Chloride 96 (*)    Glucose, Bld 101 (*)    Creatinine, Ser 1.08 (*)    Calcium 8.6 (*)    Total Protein 5.8 (*)    Albumin 3.3 (*)    GFR, Estimated 49 (*)    All other components within normal limits  URINALYSIS, ROUTINE W REFLEX MICROSCOPIC - Abnormal; Notable for the following components:   APPearance HAZY (*)    Nitrite POSITIVE (*)    Leukocytes,Ua MODERATE (*)    All other components within normal limits  URINALYSIS, MICROSCOPIC (REFLEX) - Abnormal; Notable for the following components:   Bacteria, UA MANY (*)    All other components within normal limits  URINE CULTURE  CBC WITH DIFFERENTIAL/PLATELET  LIPASE, BLOOD    EKG None  Radiology CT ABDOMEN PELVIS W CONTRAST  Result Date: 09/04/2022 CLINICAL DATA:  Bowel obstruction suspected EXAM: CT ABDOMEN AND PELVIS WITH CONTRAST TECHNIQUE: Multidetector CT imaging of the abdomen and pelvis was performed using the standard protocol following bolus administration of intravenous contrast. RADIATION DOSE REDUCTION: This exam was performed  according to the departmental dose-optimization program which includes automated exposure control, adjustment of the mA and/or kV according to patient size and/or use of iterative reconstruction technique. CONTRAST:  80mL OMNIPAQUE IOHEXOL 300 MG/ML  SOLN COMPARISON:  CT abdomen and pelvis dated August 23, 2019 FINDINGS: Lower chest: New scattered small solid pulmonary nodules. Largest measures 6 mm in the right middle lobe on series 4, image 1. Small hiatal hernia. Partially visualized cardiac conduction  device leads in the right atrium right ventricle. Hepatobiliary: Heterogeneous attenuation of the right lobe of the liver. No suspicious focal lesion. Gallbladder is unremarkable. No biliary ductal dilation. Pancreas: Unremarkable. No pancreatic ductal dilatation or surrounding inflammatory changes. Spleen: Normal in size without focal abnormality. Adrenals/Urinary Tract: Bilateral adrenal glands are unremarkable. No hydronephrosis. Simple appearing cyst of the mid region of the left kidney, no specific follow-up imaging is necessary. Mild urothelial thickening of the right renal pelvis and proximal ureter. Bladder is unremarkable. Stomach/Bowel: Stomach is within normal limits. Postsurgical changes of the distal small bowel. No evidence of bowel wall thickening, distention, or inflammatory changes. Vascular/Lymphatic: Aortic atherosclerosis. No enlarged abdominal or pelvic lymph nodes. Reproductive: Uterus and bilateral adnexa are unremarkable. Other: No abdominal wall hernia or abnormality. No abdominopelvic ascites. Musculoskeletal: Degenerative changes of the lumbar spine. No aggressive appearing osseous lesions. Osteopenia. IMPRESSION: 1. No evidence of bowel obstruction. 2. Mild urothelial thickening of the right renal pelvis and proximal ureter, concerning for infection. Correlate with urinalysis. 3. Nonspecific heterogeneous attenuation of the right lobe of the liver. Correlate with liver function tests. 4.  New scattered small solid pulmonary nodules measuring up to 6 mm. Non-contrast chest CT at 3-6 months is recommended. If the nodules are stable at time of repeat CT, then future CT at 18-24 months (from today's scan) is considered optional for low-risk patients, but is recommended for high-risk patients. This recommendation follows the consensus statement: Guidelines for Management of Incidental Pulmonary Nodules Detected on CT Images: From the Fleischner Society 2017; Radiology 2017; 284:228-243. 5. Aortic Atherosclerosis (ICD10-I70.0). Electronically Signed   By: Allegra Lai M.D.   On: 09/04/2022 15:04   CT L-SPINE NO CHARGE  Result Date: 09/04/2022 CLINICAL DATA:  Pain EXAM: CT LUMBAR SPINE WITHOUT CONTRAST TECHNIQUE: Multidetector CT imaging of the lumbar spine was performed without intravenous contrast administration. Multiplanar CT image reconstructions were also generated. RADIATION DOSE REDUCTION: This exam was performed according to the departmental dose-optimization program which includes automated exposure control, adjustment of the mA and/or kV according to patient size and/or use of iterative reconstruction technique. COMPARISON:  CT AP 08/23/19 FINDINGS: Segmentation: 5 lumbar type vertebrae. Alignment: Grade 1 anterolisthesis of L1-L2 and L2 on L3 Vertebrae: Generalized osteopenia. No evidence of a vertebral body compression deformity. Appearance of the sacrum is unchanged compared to 08/23/19 CT AP. Paraspinal and other soft tissues: See separately dictated CT abdomen and pelvis for visceral findings. Disc levels: Neuroforamina: - Severe right-sided neural foraminal stenosis at L2-L3, likely secondary to an intraspinal synovial cyst arising from the right facet joint (series 8, image 40). - Severe right neural foraminal stenosis at L3-L4 Spinal canal: - Moderate to severe spinal canal narrowing at L2-L3 and L3-L4 - Moderate spinal canal narrowing at L1-L2 and  L5-S1 -there is likely a right  foraminal disc bulge or a disc protrusion at L5-S1 (series 9, image 73) -there is likely narrowing of the left lateral recess at L2-L3 secondary to a left paracentral disc protrusion (series 9, image 40). IMPRESSION: 1. No evidence of a vertebral body compression deformity. 2. Multilevel degenerative changes of the lumbar spine, as described above. Electronically Signed   By: Lorenza Cambridge M.D.   On: 09/04/2022 14:51   DG Hip Unilat W or Wo Pelvis 2-3 Views Right  Result Date: 09/04/2022 CLINICAL DATA:  Atraumatic right hip pain EXAM: DG HIP (WITH OR WITHOUT PELVIS) 3V RIGHT COMPARISON:  None Available. FINDINGS: Osteopenia. Severe joint space loss along the hips bilaterally particularly  along the superior aspect with underlying sclerosis. No acute fracture or dislocation. Mild hyperostosis. Prominent vascular calcifications. IMPRESSION: Advanced degenerative changes of the hip joints with joint space loss and sclerosis. Severe osteopenia Electronically Signed   By: Karen Kays M.D.   On: 09/04/2022 13:48    Procedures Procedures    Medications Ordered in ED Medications  ketorolac (TORADOL) 15 MG/ML injection 15 mg (15 mg Intravenous Given 09/04/22 1322)  iohexol (OMNIPAQUE) 300 MG/ML solution 100 mL (80 mLs Intravenous Contrast Given 09/04/22 1358)  cefTRIAXone (ROCEPHIN) 1 g in sodium chloride 0.9 % 100 mL IVPB (0 g Intravenous Stopped 09/04/22 1633)    ED Course/ Medical Decision Making/ A&P Clinical Course as of 09/05/22 1610  Thu Sep 04, 2022  1515 Anticipate discharge home after antibiotics [MT]    Clinical Course User Index [MT] Renaye Rakers Kermit Balo, MD                             Medical Decision Making Amount and/or Complexity of Data Reviewed Labs: ordered. Radiology: ordered.  Risk Prescription drug management.   The patient is here with several medical complaints.  Right hip pain -this is most likely musculoskeletal in nature, may be related to lumbar radiculopathy versus  sacroiliitis given the reproducibility of her symptoms.  In this case we can try a short course of NSAID, which has not been taking.  I will check a hip x-ray and likely CT of the lumbar spine to evaluate for occult fracture, given her age and osteoporosis risk. Nausea/constipation -nausea may be a pain response, constipation may be reactive to the Zofran, however with her history of bowel obstruction I think is reasonable to perform a further abdominal workup, including abdominal labs, UA, potential CT imaging of the abdomen.  This would be to evaluate for acute intra-abdominal process which may be causing referred pain into the right hip region, including ureteral colic, UTI, and colitis, or ileus.  Patient is passing gas.  I have a low suspicion for AAA or mesenteric ischemia do not believe she needs CT angiogram at this time.  Likewise low suspicion for DVT.  Supplemental history is provided by the patient's son at bedside.  IV Toradol was given at low-dose for pain here in the ED.  I reviewed patient's PCP office record from earlier this week per external record review.  I personally reviewed the patient's labs and imaging, notable for UA with +nitrites and leukocytes, many bacteria, consistent with UTI.  WBC wnl.  Hgb wnl.  Mild hyponatremia Na 131.  Xray of the hip with severe osteoarthritic changes.  CT imaging of L spine with multilevel DDD and possible synovial cyst at L2-L3 nerve root.  CT abdomen concerning for bladder and right ureter thickening and pulmonary nodule.  Plan to treat with IV rocephin here and extended course of keflex for complicated UTI at home, while sending urine culture.  Her pain may be related to urinary tract infection.  However it's also possible this pain is related to her lumbar spinal findings, particularly in the L2 distribution, and we discussed follow up with a spinal clinic, which her son had requested.    I do think it's reasonable to treat these conditions  as an outpatient.  Her pain is improved and she does not appear toxic, I have a low suspicion for sepsis.         Final Clinical Impression(s) / ED Diagnoses Final diagnoses:  Acute cystitis without hematuria  Right hip pain  Lumbar radiculopathy    Rx / DC Orders ED Discharge Orders          Ordered    cephALEXin (KEFLEX) 500 MG capsule  3 times daily        09/04/22 1520    ibuprofen (ADVIL) 400 MG tablet  Every 8 hours PRN        09/04/22 1520              Terald Sleeper, MD 09/05/22 [phone]

## 2022-09-04 NOTE — ED Triage Notes (Signed)
Pt having right hip pain since last Wednesday.  Denies any injury.  Seen at PCP for same

## 2022-09-05 LAB — URINE CULTURE

## 2022-09-06 LAB — URINE CULTURE

## 2022-09-08 LAB — URINE CULTURE: Culture: >=100000 — AB

## 2022-09-09 ENCOUNTER — Telehealth (HOSPITAL_BASED_OUTPATIENT_CLINIC_OR_DEPARTMENT_OTHER): Payer: Self-pay | Admitting: *Deleted

## 2022-09-09 NOTE — Telephone Encounter (Signed)
Post ED Visit - Positive Culture Follow-up  Culture report reviewed by antimicrobial stewardship pharmacist: [facility] []  Enzo Bi, Pharm.D. []  Celedonio Miyamoto, Pharm.D., BCPS AQ-ID []  Garvin Fila, Pharm.D., BCPS []  Georgina Pillion, Pharm.D., BCPS []  Bellefontaine Neighbors, [address].D., BCPS, AAHIVP []  Estella Husk, Pharm.D., BCPS, AAHIVP []  Lysle Pearl, PharmD, BCPS []  Phillips Climes, PharmD, BCPS []  Agapito Games, PharmD, BCPS []  Verlan Friends, PharmD []  Mervyn Gay, PharmD, BCPS []  Vinnie Level, PharmD  [facility] []  Len Childs, PharmD []  Greer Pickerel, PharmD []  Adalberto Cole, PharmD []  Perlie Gold, Rph []  [name] Face) Jean Rosenthal, PharmD []  Earl Many, PharmD []  Junita Push, PharmD []  Dorna Leitz, PharmD []  Terrilee Files, PharmD []  Lynann Beaver, PharmD []  Keturah Barre, PharmD []  Loralee Pacas, PharmD []  Bernadene Person, PharmD   Positive Urine culture Treated with Cephalexin, organism sensitive to the same and no further patient follow-up is required at this time Per [facility]  Nena Polio Garner Nash 09/09/2022, 8:35 AM

## 2022-09-17 DIAGNOSIS — M79672 Pain in left foot: Secondary | ICD-10-CM | POA: Diagnosis not present

## 2022-09-18 DIAGNOSIS — Z79899 Other long term (current) drug therapy: Secondary | ICD-10-CM | POA: Diagnosis not present

## 2022-09-18 DIAGNOSIS — M0579 Rheumatoid arthritis with rheumatoid factor of multiple sites without organ or systems involvement: Secondary | ICD-10-CM | POA: Diagnosis not present

## 2022-09-18 DIAGNOSIS — M810 Age-related osteoporosis without current pathological fracture: Secondary | ICD-10-CM | POA: Diagnosis not present

## 2022-09-18 DIAGNOSIS — R202 Paresthesia of skin: Secondary | ICD-10-CM | POA: Diagnosis not present

## 2022-09-18 DIAGNOSIS — M25572 Pain in left ankle and joints of left foot: Secondary | ICD-10-CM | POA: Diagnosis not present

## 2022-09-18 DIAGNOSIS — M1990 Unspecified osteoarthritis, unspecified site: Secondary | ICD-10-CM | POA: Diagnosis not present

## 2022-09-18 DIAGNOSIS — M109 Gout, unspecified: Secondary | ICD-10-CM | POA: Diagnosis not present

## 2022-09-18 DIAGNOSIS — M79643 Pain in unspecified hand: Secondary | ICD-10-CM | POA: Diagnosis not present

## 2022-09-22 DIAGNOSIS — R918 Other nonspecific abnormal finding of lung field: Secondary | ICD-10-CM | POA: Diagnosis not present

## 2022-09-22 DIAGNOSIS — N390 Urinary tract infection, site not specified: Secondary | ICD-10-CM | POA: Diagnosis not present

## 2022-09-22 DIAGNOSIS — M1610 Unilateral primary osteoarthritis, unspecified hip: Secondary | ICD-10-CM | POA: Diagnosis not present

## 2022-09-22 DIAGNOSIS — Z87440 Personal history of urinary (tract) infections: Secondary | ICD-10-CM | POA: Diagnosis not present

## 2022-09-22 DIAGNOSIS — R3982 Chronic bladder pain: Secondary | ICD-10-CM | POA: Diagnosis not present

## 2022-09-22 DIAGNOSIS — Z09 Encounter for follow-up examination after completed treatment for conditions other than malignant neoplasm: Secondary | ICD-10-CM | POA: Diagnosis not present

## 2022-10-08 DIAGNOSIS — Z45018 Encounter for adjustment and management of other part of cardiac pacemaker: Secondary | ICD-10-CM | POA: Diagnosis not present

## 2022-10-08 DIAGNOSIS — I442 Atrioventricular block, complete: Secondary | ICD-10-CM | POA: Diagnosis not present

## 2022-10-10 ENCOUNTER — Other Ambulatory Visit: Payer: Self-pay | Admitting: Cardiology

## 2022-10-13 ENCOUNTER — Ambulatory Visit: Payer: Medicare PPO | Admitting: Podiatry

## 2022-10-13 ENCOUNTER — Ambulatory Visit (INDEPENDENT_AMBULATORY_CARE_PROVIDER_SITE_OTHER): Payer: Medicare PPO

## 2022-10-13 DIAGNOSIS — M069 Rheumatoid arthritis, unspecified: Secondary | ICD-10-CM

## 2022-10-13 NOTE — Progress Notes (Signed)
   Chief Complaint  Patient presents with   Foot Pain    Left foot pain  Pt stated that it has gotten a little better     HPI: 89 y.o. female presenting today as a reestablish new patient for evaluation of left foot pain ongoing over the past month.  Patient states that she was referred by her neurospine surgeon for left foot pain.  He prescribed her diclofenac 50 mg twice daily on 09/18/2022.  Patient states that shortly after she began taking the anti-inflammatory her foot pain resolved.  She is no longer having any pain or tenderness to the foot  Past Medical History:  Diagnosis Date   Allergy    seasonal   Anemia    Arthritis    Encounter for care of pacemaker 07/12/2020   GERD (gastroesophageal reflux disease)    Heart block AV complete (HCC) 07/11/2020   Hyperlipidemia    Hypertension    Pacemaker Abbott Assurity MRI model [id number] 07/12/2020 07/12/2020    Past Surgical History:  Procedure Laterality Date   APPENDECTOMY     BACK SURGERY     BOWEL RESECTION N/A 08/24/2019   Procedure: SMALL BOWEL RESECTION;  Surgeon: Abigail Miyamoto, MD;  Location: [facility];  Service: General;  Laterality: N/A;   LAPAROSCOPY N/A 08/24/2019   Procedure: LAPAROSCOPY DIAGNOSTIC;  Surgeon: Abigail Miyamoto, MD;  Location: [facility];  Service: General;  Laterality: N/A;   LAPAROTOMY N/A 08/24/2019   Procedure: EXPLORATORY LAPAROTOMY;  Surgeon: Abigail Miyamoto, MD;  Location: [facility];  Service: General;  Laterality: N/A;   PACEMAKER IMPLANT N/A 07/12/2020   Procedure: PACEMAKER IMPLANT;  Surgeon: Hillis Range, MD;  Location: [facility];  Service: Cardiovascular;  Laterality: N/A;    Allergies  Allergen Reactions   Coreg [Carvedilol] Diarrhea   Lorazepam Diarrhea     Physical Exam: General: The patient is alert and oriented x3 in no acute distress.  Dermatology: Skin is warm, dry and supple bilateral lower extremities.   Vascular: Palpable pedal pulses bilaterally. Capillary refill within  normal limits.  No appreciable edema.  No erythema.  Neurological: Grossly intact via light touch  Musculoskeletal Exam: Rheumatoid foot and ankle  Radiographic Exam LT foot 10/13/2022:  Advance degenerative changes noted throughout the foot and ankle and toes consistent with rheumatoid arthritis.  Assessment/Plan of Care: 1.  Acute pain left foot and ankle; resolved  -Patient evaluated.  X-rays reviewed -Fortunately the patient has not had any pain or tenderness for the last 3-4 weeks. -Continue good supportive shoes and sneakers -Return to clinic as needed       Felecia Shelling, DPM [facility]  Dr. Felecia Shelling, DPM    [address] [place], [place] [address]                Office [phone]  Fax [phone]

## 2022-10-24 DIAGNOSIS — S40021A Contusion of right upper arm, initial encounter: Secondary | ICD-10-CM | POA: Diagnosis not present

## 2022-10-24 DIAGNOSIS — M79602 Pain in left arm: Secondary | ICD-10-CM | POA: Diagnosis not present

## 2022-10-24 DIAGNOSIS — S59912A Unspecified injury of left forearm, initial encounter: Secondary | ICD-10-CM | POA: Diagnosis not present

## 2022-10-24 DIAGNOSIS — S80812A Abrasion, left lower leg, initial encounter: Secondary | ICD-10-CM | POA: Diagnosis not present

## 2022-10-24 DIAGNOSIS — S8992XA Unspecified injury of left lower leg, initial encounter: Secondary | ICD-10-CM | POA: Diagnosis not present

## 2022-10-24 DIAGNOSIS — S4992XA Unspecified injury of left shoulder and upper arm, initial encounter: Secondary | ICD-10-CM | POA: Diagnosis not present

## 2022-10-24 DIAGNOSIS — W19XXXA Unspecified fall, initial encounter: Secondary | ICD-10-CM | POA: Diagnosis not present

## 2022-10-24 DIAGNOSIS — I959 Hypotension, unspecified: Secondary | ICD-10-CM | POA: Diagnosis not present

## 2022-10-24 DIAGNOSIS — I442 Atrioventricular block, complete: Secondary | ICD-10-CM | POA: Diagnosis not present

## 2022-10-24 DIAGNOSIS — M19172 Post-traumatic osteoarthritis, left ankle and foot: Secondary | ICD-10-CM | POA: Diagnosis not present

## 2022-10-24 DIAGNOSIS — S40022A Contusion of left upper arm, initial encounter: Secondary | ICD-10-CM | POA: Diagnosis not present

## 2022-10-24 DIAGNOSIS — M069 Rheumatoid arthritis, unspecified: Secondary | ICD-10-CM | POA: Diagnosis not present

## 2022-10-24 DIAGNOSIS — R55 Syncope and collapse: Secondary | ICD-10-CM | POA: Diagnosis not present

## 2022-10-24 DIAGNOSIS — S199XXA Unspecified injury of neck, initial encounter: Secondary | ICD-10-CM | POA: Diagnosis not present

## 2022-10-24 DIAGNOSIS — Z96612 Presence of left artificial shoulder joint: Secondary | ICD-10-CM | POA: Diagnosis not present

## 2022-10-24 DIAGNOSIS — S70212A Abrasion, left hip, initial encounter: Secondary | ICD-10-CM | POA: Diagnosis not present

## 2022-10-24 DIAGNOSIS — G8911 Acute pain due to trauma: Secondary | ICD-10-CM | POA: Diagnosis not present

## 2022-10-24 DIAGNOSIS — S299XXA Unspecified injury of thorax, initial encounter: Secondary | ICD-10-CM | POA: Diagnosis not present

## 2022-10-24 DIAGNOSIS — Z950 Presence of cardiac pacemaker: Secondary | ICD-10-CM | POA: Diagnosis not present

## 2022-10-24 DIAGNOSIS — S0990XA Unspecified injury of head, initial encounter: Secondary | ICD-10-CM | POA: Diagnosis not present

## 2022-10-24 DIAGNOSIS — T148XXA Other injury of unspecified body region, initial encounter: Secondary | ICD-10-CM | POA: Diagnosis not present

## 2022-10-24 DIAGNOSIS — M25522 Pain in left elbow: Secondary | ICD-10-CM | POA: Diagnosis not present

## 2022-10-25 DIAGNOSIS — I3481 Nonrheumatic mitral (valve) annulus calcification: Secondary | ICD-10-CM | POA: Diagnosis not present

## 2022-10-25 DIAGNOSIS — Z950 Presence of cardiac pacemaker: Secondary | ICD-10-CM | POA: Diagnosis not present

## 2022-10-25 DIAGNOSIS — R55 Syncope and collapse: Secondary | ICD-10-CM | POA: Diagnosis not present

## 2022-10-25 DIAGNOSIS — I361 Nonrheumatic tricuspid (valve) insufficiency: Secondary | ICD-10-CM | POA: Diagnosis not present

## 2022-11-01 DIAGNOSIS — E039 Hypothyroidism, unspecified: Secondary | ICD-10-CM | POA: Diagnosis not present

## 2022-11-01 DIAGNOSIS — K219 Gastro-esophageal reflux disease without esophagitis: Secondary | ICD-10-CM | POA: Diagnosis not present

## 2022-11-01 DIAGNOSIS — M810 Age-related osteoporosis without current pathological fracture: Secondary | ICD-10-CM | POA: Diagnosis not present

## 2022-11-01 DIAGNOSIS — M069 Rheumatoid arthritis, unspecified: Secondary | ICD-10-CM | POA: Diagnosis not present

## 2022-11-01 DIAGNOSIS — I5042 Chronic combined systolic (congestive) and diastolic (congestive) heart failure: Secondary | ICD-10-CM | POA: Diagnosis not present

## 2022-11-01 DIAGNOSIS — I428 Other cardiomyopathies: Secondary | ICD-10-CM | POA: Diagnosis not present

## 2022-11-01 DIAGNOSIS — G8911 Acute pain due to trauma: Secondary | ICD-10-CM | POA: Diagnosis not present

## 2022-11-01 DIAGNOSIS — I110 Hypertensive heart disease with heart failure: Secondary | ICD-10-CM | POA: Diagnosis not present

## 2022-11-01 DIAGNOSIS — G8929 Other chronic pain: Secondary | ICD-10-CM | POA: Diagnosis not present

## 2022-11-05 DIAGNOSIS — I5042 Chronic combined systolic (congestive) and diastolic (congestive) heart failure: Secondary | ICD-10-CM | POA: Diagnosis not present

## 2022-11-05 DIAGNOSIS — M810 Age-related osteoporosis without current pathological fracture: Secondary | ICD-10-CM | POA: Diagnosis not present

## 2022-11-05 DIAGNOSIS — I110 Hypertensive heart disease with heart failure: Secondary | ICD-10-CM | POA: Diagnosis not present

## 2022-11-05 DIAGNOSIS — G8929 Other chronic pain: Secondary | ICD-10-CM | POA: Diagnosis not present

## 2022-11-05 DIAGNOSIS — G8911 Acute pain due to trauma: Secondary | ICD-10-CM | POA: Diagnosis not present

## 2022-11-05 DIAGNOSIS — E039 Hypothyroidism, unspecified: Secondary | ICD-10-CM | POA: Diagnosis not present

## 2022-11-05 DIAGNOSIS — M069 Rheumatoid arthritis, unspecified: Secondary | ICD-10-CM | POA: Diagnosis not present

## 2022-11-05 DIAGNOSIS — K219 Gastro-esophageal reflux disease without esophagitis: Secondary | ICD-10-CM | POA: Diagnosis not present

## 2022-11-05 DIAGNOSIS — I428 Other cardiomyopathies: Secondary | ICD-10-CM | POA: Diagnosis not present

## 2022-11-06 DIAGNOSIS — I5042 Chronic combined systolic (congestive) and diastolic (congestive) heart failure: Secondary | ICD-10-CM | POA: Diagnosis not present

## 2022-11-06 DIAGNOSIS — M069 Rheumatoid arthritis, unspecified: Secondary | ICD-10-CM | POA: Diagnosis not present

## 2022-11-06 DIAGNOSIS — G8929 Other chronic pain: Secondary | ICD-10-CM | POA: Diagnosis not present

## 2022-11-06 DIAGNOSIS — E039 Hypothyroidism, unspecified: Secondary | ICD-10-CM | POA: Diagnosis not present

## 2022-11-06 DIAGNOSIS — I428 Other cardiomyopathies: Secondary | ICD-10-CM | POA: Diagnosis not present

## 2022-11-06 DIAGNOSIS — M810 Age-related osteoporosis without current pathological fracture: Secondary | ICD-10-CM | POA: Diagnosis not present

## 2022-11-06 DIAGNOSIS — G8911 Acute pain due to trauma: Secondary | ICD-10-CM | POA: Diagnosis not present

## 2022-11-06 DIAGNOSIS — K219 Gastro-esophageal reflux disease without esophagitis: Secondary | ICD-10-CM | POA: Diagnosis not present

## 2022-11-06 DIAGNOSIS — I110 Hypertensive heart disease with heart failure: Secondary | ICD-10-CM | POA: Diagnosis not present

## 2022-11-07 ENCOUNTER — Ambulatory Visit: Payer: Medicare PPO | Admitting: Cardiology

## 2022-11-13 DIAGNOSIS — K219 Gastro-esophageal reflux disease without esophagitis: Secondary | ICD-10-CM | POA: Diagnosis not present

## 2022-11-13 DIAGNOSIS — I5042 Chronic combined systolic (congestive) and diastolic (congestive) heart failure: Secondary | ICD-10-CM | POA: Diagnosis not present

## 2022-11-13 DIAGNOSIS — E039 Hypothyroidism, unspecified: Secondary | ICD-10-CM | POA: Diagnosis not present

## 2022-11-13 DIAGNOSIS — G8911 Acute pain due to trauma: Secondary | ICD-10-CM | POA: Diagnosis not present

## 2022-11-13 DIAGNOSIS — I110 Hypertensive heart disease with heart failure: Secondary | ICD-10-CM | POA: Diagnosis not present

## 2022-11-13 DIAGNOSIS — M069 Rheumatoid arthritis, unspecified: Secondary | ICD-10-CM | POA: Diagnosis not present

## 2022-11-13 DIAGNOSIS — G8929 Other chronic pain: Secondary | ICD-10-CM | POA: Diagnosis not present

## 2022-11-13 DIAGNOSIS — M810 Age-related osteoporosis without current pathological fracture: Secondary | ICD-10-CM | POA: Diagnosis not present

## 2022-11-13 DIAGNOSIS — I428 Other cardiomyopathies: Secondary | ICD-10-CM | POA: Diagnosis not present

## 2022-11-14 DIAGNOSIS — M069 Rheumatoid arthritis, unspecified: Secondary | ICD-10-CM | POA: Diagnosis not present

## 2022-11-14 DIAGNOSIS — G8929 Other chronic pain: Secondary | ICD-10-CM | POA: Diagnosis not present

## 2022-11-14 DIAGNOSIS — E039 Hypothyroidism, unspecified: Secondary | ICD-10-CM | POA: Diagnosis not present

## 2022-11-14 DIAGNOSIS — G8911 Acute pain due to trauma: Secondary | ICD-10-CM | POA: Diagnosis not present

## 2022-11-14 DIAGNOSIS — M810 Age-related osteoporosis without current pathological fracture: Secondary | ICD-10-CM | POA: Diagnosis not present

## 2022-11-14 DIAGNOSIS — I428 Other cardiomyopathies: Secondary | ICD-10-CM | POA: Diagnosis not present

## 2022-11-14 DIAGNOSIS — K219 Gastro-esophageal reflux disease without esophagitis: Secondary | ICD-10-CM | POA: Diagnosis not present

## 2022-11-14 DIAGNOSIS — I110 Hypertensive heart disease with heart failure: Secondary | ICD-10-CM | POA: Diagnosis not present

## 2022-11-14 DIAGNOSIS — I5042 Chronic combined systolic (congestive) and diastolic (congestive) heart failure: Secondary | ICD-10-CM | POA: Diagnosis not present

## 2022-11-18 DIAGNOSIS — K219 Gastro-esophageal reflux disease without esophagitis: Secondary | ICD-10-CM | POA: Diagnosis not present

## 2022-11-18 DIAGNOSIS — G8911 Acute pain due to trauma: Secondary | ICD-10-CM | POA: Diagnosis not present

## 2022-11-18 DIAGNOSIS — I5042 Chronic combined systolic (congestive) and diastolic (congestive) heart failure: Secondary | ICD-10-CM | POA: Diagnosis not present

## 2022-11-18 DIAGNOSIS — E039 Hypothyroidism, unspecified: Secondary | ICD-10-CM | POA: Diagnosis not present

## 2022-11-18 DIAGNOSIS — M069 Rheumatoid arthritis, unspecified: Secondary | ICD-10-CM | POA: Diagnosis not present

## 2022-11-18 DIAGNOSIS — G8929 Other chronic pain: Secondary | ICD-10-CM | POA: Diagnosis not present

## 2022-11-18 DIAGNOSIS — I110 Hypertensive heart disease with heart failure: Secondary | ICD-10-CM | POA: Diagnosis not present

## 2022-11-18 DIAGNOSIS — M810 Age-related osteoporosis without current pathological fracture: Secondary | ICD-10-CM | POA: Diagnosis not present

## 2022-11-18 DIAGNOSIS — I428 Other cardiomyopathies: Secondary | ICD-10-CM | POA: Diagnosis not present

## 2022-11-20 DIAGNOSIS — I110 Hypertensive heart disease with heart failure: Secondary | ICD-10-CM | POA: Diagnosis not present

## 2022-11-20 DIAGNOSIS — M069 Rheumatoid arthritis, unspecified: Secondary | ICD-10-CM | POA: Diagnosis not present

## 2022-11-20 DIAGNOSIS — E039 Hypothyroidism, unspecified: Secondary | ICD-10-CM | POA: Diagnosis not present

## 2022-11-20 DIAGNOSIS — I5042 Chronic combined systolic (congestive) and diastolic (congestive) heart failure: Secondary | ICD-10-CM | POA: Diagnosis not present

## 2022-11-20 DIAGNOSIS — G8911 Acute pain due to trauma: Secondary | ICD-10-CM | POA: Diagnosis not present

## 2022-11-20 DIAGNOSIS — G8929 Other chronic pain: Secondary | ICD-10-CM | POA: Diagnosis not present

## 2022-11-20 DIAGNOSIS — K219 Gastro-esophageal reflux disease without esophagitis: Secondary | ICD-10-CM | POA: Diagnosis not present

## 2022-11-20 DIAGNOSIS — M810 Age-related osteoporosis without current pathological fracture: Secondary | ICD-10-CM | POA: Diagnosis not present

## 2022-11-20 DIAGNOSIS — I428 Other cardiomyopathies: Secondary | ICD-10-CM | POA: Diagnosis not present

## 2022-11-24 DIAGNOSIS — I5042 Chronic combined systolic (congestive) and diastolic (congestive) heart failure: Secondary | ICD-10-CM | POA: Diagnosis not present

## 2022-11-24 DIAGNOSIS — G8929 Other chronic pain: Secondary | ICD-10-CM | POA: Diagnosis not present

## 2022-11-24 DIAGNOSIS — M810 Age-related osteoporosis without current pathological fracture: Secondary | ICD-10-CM | POA: Diagnosis not present

## 2022-11-24 DIAGNOSIS — I428 Other cardiomyopathies: Secondary | ICD-10-CM | POA: Diagnosis not present

## 2022-11-24 DIAGNOSIS — M069 Rheumatoid arthritis, unspecified: Secondary | ICD-10-CM | POA: Diagnosis not present

## 2022-11-24 DIAGNOSIS — G8911 Acute pain due to trauma: Secondary | ICD-10-CM | POA: Diagnosis not present

## 2022-11-24 DIAGNOSIS — K219 Gastro-esophageal reflux disease without esophagitis: Secondary | ICD-10-CM | POA: Diagnosis not present

## 2022-11-24 DIAGNOSIS — I110 Hypertensive heart disease with heart failure: Secondary | ICD-10-CM | POA: Diagnosis not present

## 2022-11-24 DIAGNOSIS — E039 Hypothyroidism, unspecified: Secondary | ICD-10-CM | POA: Diagnosis not present

## 2022-11-26 DIAGNOSIS — I428 Other cardiomyopathies: Secondary | ICD-10-CM | POA: Diagnosis not present

## 2022-11-26 DIAGNOSIS — M069 Rheumatoid arthritis, unspecified: Secondary | ICD-10-CM | POA: Diagnosis not present

## 2022-11-26 DIAGNOSIS — I110 Hypertensive heart disease with heart failure: Secondary | ICD-10-CM | POA: Diagnosis not present

## 2022-11-26 DIAGNOSIS — I5042 Chronic combined systolic (congestive) and diastolic (congestive) heart failure: Secondary | ICD-10-CM | POA: Diagnosis not present

## 2022-11-27 DIAGNOSIS — G8911 Acute pain due to trauma: Secondary | ICD-10-CM | POA: Diagnosis not present

## 2022-11-27 DIAGNOSIS — M810 Age-related osteoporosis without current pathological fracture: Secondary | ICD-10-CM | POA: Diagnosis not present

## 2022-11-27 DIAGNOSIS — I5042 Chronic combined systolic (congestive) and diastolic (congestive) heart failure: Secondary | ICD-10-CM | POA: Diagnosis not present

## 2022-11-27 DIAGNOSIS — E039 Hypothyroidism, unspecified: Secondary | ICD-10-CM | POA: Diagnosis not present

## 2022-11-27 DIAGNOSIS — I428 Other cardiomyopathies: Secondary | ICD-10-CM | POA: Diagnosis not present

## 2022-11-27 DIAGNOSIS — K219 Gastro-esophageal reflux disease without esophagitis: Secondary | ICD-10-CM | POA: Diagnosis not present

## 2022-11-27 DIAGNOSIS — G8929 Other chronic pain: Secondary | ICD-10-CM | POA: Diagnosis not present

## 2022-11-27 DIAGNOSIS — I110 Hypertensive heart disease with heart failure: Secondary | ICD-10-CM | POA: Diagnosis not present

## 2022-11-27 DIAGNOSIS — M069 Rheumatoid arthritis, unspecified: Secondary | ICD-10-CM | POA: Diagnosis not present

## 2022-12-01 DIAGNOSIS — I5042 Chronic combined systolic (congestive) and diastolic (congestive) heart failure: Secondary | ICD-10-CM | POA: Diagnosis not present

## 2022-12-01 DIAGNOSIS — M810 Age-related osteoporosis without current pathological fracture: Secondary | ICD-10-CM | POA: Diagnosis not present

## 2022-12-01 DIAGNOSIS — I110 Hypertensive heart disease with heart failure: Secondary | ICD-10-CM | POA: Diagnosis not present

## 2022-12-01 DIAGNOSIS — I428 Other cardiomyopathies: Secondary | ICD-10-CM | POA: Diagnosis not present

## 2022-12-01 DIAGNOSIS — K219 Gastro-esophageal reflux disease without esophagitis: Secondary | ICD-10-CM | POA: Diagnosis not present

## 2022-12-01 DIAGNOSIS — M069 Rheumatoid arthritis, unspecified: Secondary | ICD-10-CM | POA: Diagnosis not present

## 2022-12-01 DIAGNOSIS — E039 Hypothyroidism, unspecified: Secondary | ICD-10-CM | POA: Diagnosis not present

## 2022-12-01 DIAGNOSIS — G8911 Acute pain due to trauma: Secondary | ICD-10-CM | POA: Diagnosis not present

## 2022-12-01 DIAGNOSIS — G8929 Other chronic pain: Secondary | ICD-10-CM | POA: Diagnosis not present

## 2022-12-02 DIAGNOSIS — K219 Gastro-esophageal reflux disease without esophagitis: Secondary | ICD-10-CM | POA: Diagnosis not present

## 2022-12-02 DIAGNOSIS — M810 Age-related osteoporosis without current pathological fracture: Secondary | ICD-10-CM | POA: Diagnosis not present

## 2022-12-02 DIAGNOSIS — E039 Hypothyroidism, unspecified: Secondary | ICD-10-CM | POA: Diagnosis not present

## 2022-12-02 DIAGNOSIS — I428 Other cardiomyopathies: Secondary | ICD-10-CM | POA: Diagnosis not present

## 2022-12-02 DIAGNOSIS — I110 Hypertensive heart disease with heart failure: Secondary | ICD-10-CM | POA: Diagnosis not present

## 2022-12-02 DIAGNOSIS — M069 Rheumatoid arthritis, unspecified: Secondary | ICD-10-CM | POA: Diagnosis not present

## 2022-12-02 DIAGNOSIS — G8929 Other chronic pain: Secondary | ICD-10-CM | POA: Diagnosis not present

## 2022-12-02 DIAGNOSIS — I5042 Chronic combined systolic (congestive) and diastolic (congestive) heart failure: Secondary | ICD-10-CM | POA: Diagnosis not present

## 2022-12-02 DIAGNOSIS — G8911 Acute pain due to trauma: Secondary | ICD-10-CM | POA: Diagnosis not present

## 2022-12-04 DIAGNOSIS — K219 Gastro-esophageal reflux disease without esophagitis: Secondary | ICD-10-CM | POA: Diagnosis not present

## 2022-12-04 DIAGNOSIS — G8911 Acute pain due to trauma: Secondary | ICD-10-CM | POA: Diagnosis not present

## 2022-12-04 DIAGNOSIS — G8929 Other chronic pain: Secondary | ICD-10-CM | POA: Diagnosis not present

## 2022-12-04 DIAGNOSIS — E039 Hypothyroidism, unspecified: Secondary | ICD-10-CM | POA: Diagnosis not present

## 2022-12-04 DIAGNOSIS — I428 Other cardiomyopathies: Secondary | ICD-10-CM | POA: Diagnosis not present

## 2022-12-04 DIAGNOSIS — M069 Rheumatoid arthritis, unspecified: Secondary | ICD-10-CM | POA: Diagnosis not present

## 2022-12-04 DIAGNOSIS — M810 Age-related osteoporosis without current pathological fracture: Secondary | ICD-10-CM | POA: Diagnosis not present

## 2022-12-04 DIAGNOSIS — I110 Hypertensive heart disease with heart failure: Secondary | ICD-10-CM | POA: Diagnosis not present

## 2022-12-04 DIAGNOSIS — I5042 Chronic combined systolic (congestive) and diastolic (congestive) heart failure: Secondary | ICD-10-CM | POA: Diagnosis not present

## 2022-12-09 ENCOUNTER — Ambulatory Visit: Payer: Medicare PPO | Admitting: Cardiology

## 2022-12-09 DIAGNOSIS — K219 Gastro-esophageal reflux disease without esophagitis: Secondary | ICD-10-CM | POA: Diagnosis not present

## 2022-12-09 DIAGNOSIS — G8929 Other chronic pain: Secondary | ICD-10-CM | POA: Diagnosis not present

## 2022-12-09 DIAGNOSIS — G8911 Acute pain due to trauma: Secondary | ICD-10-CM | POA: Diagnosis not present

## 2022-12-09 DIAGNOSIS — I110 Hypertensive heart disease with heart failure: Secondary | ICD-10-CM | POA: Diagnosis not present

## 2022-12-09 DIAGNOSIS — M069 Rheumatoid arthritis, unspecified: Secondary | ICD-10-CM | POA: Diagnosis not present

## 2022-12-09 DIAGNOSIS — I5042 Chronic combined systolic (congestive) and diastolic (congestive) heart failure: Secondary | ICD-10-CM | POA: Diagnosis not present

## 2022-12-09 DIAGNOSIS — M810 Age-related osteoporosis without current pathological fracture: Secondary | ICD-10-CM | POA: Diagnosis not present

## 2022-12-09 DIAGNOSIS — E039 Hypothyroidism, unspecified: Secondary | ICD-10-CM | POA: Diagnosis not present

## 2022-12-09 DIAGNOSIS — I428 Other cardiomyopathies: Secondary | ICD-10-CM | POA: Diagnosis not present

## 2022-12-10 ENCOUNTER — Other Ambulatory Visit: Payer: Self-pay

## 2022-12-10 DIAGNOSIS — M069 Rheumatoid arthritis, unspecified: Secondary | ICD-10-CM | POA: Diagnosis not present

## 2022-12-10 DIAGNOSIS — G8911 Acute pain due to trauma: Secondary | ICD-10-CM | POA: Diagnosis not present

## 2022-12-10 DIAGNOSIS — I110 Hypertensive heart disease with heart failure: Secondary | ICD-10-CM | POA: Diagnosis not present

## 2022-12-10 DIAGNOSIS — K219 Gastro-esophageal reflux disease without esophagitis: Secondary | ICD-10-CM | POA: Diagnosis not present

## 2022-12-10 DIAGNOSIS — I428 Other cardiomyopathies: Secondary | ICD-10-CM | POA: Diagnosis not present

## 2022-12-10 DIAGNOSIS — I5042 Chronic combined systolic (congestive) and diastolic (congestive) heart failure: Secondary | ICD-10-CM | POA: Diagnosis not present

## 2022-12-10 DIAGNOSIS — M810 Age-related osteoporosis without current pathological fracture: Secondary | ICD-10-CM | POA: Diagnosis not present

## 2022-12-10 DIAGNOSIS — R918 Other nonspecific abnormal finding of lung field: Secondary | ICD-10-CM

## 2022-12-10 DIAGNOSIS — E039 Hypothyroidism, unspecified: Secondary | ICD-10-CM | POA: Diagnosis not present

## 2022-12-10 DIAGNOSIS — G8929 Other chronic pain: Secondary | ICD-10-CM | POA: Diagnosis not present

## 2022-12-12 ENCOUNTER — Other Ambulatory Visit: Payer: Self-pay | Admitting: Cardiology

## 2022-12-15 ENCOUNTER — Telehealth: Payer: Self-pay | Admitting: Cardiology

## 2022-12-15 DIAGNOSIS — H524 Presbyopia: Secondary | ICD-10-CM | POA: Diagnosis not present

## 2022-12-15 DIAGNOSIS — H52223 Regular astigmatism, bilateral: Secondary | ICD-10-CM | POA: Diagnosis not present

## 2022-12-15 DIAGNOSIS — H5203 Hypermetropia, bilateral: Secondary | ICD-10-CM | POA: Diagnosis not present

## 2022-12-15 DIAGNOSIS — Z961 Presence of intraocular lens: Secondary | ICD-10-CM | POA: Diagnosis not present

## 2022-12-15 DIAGNOSIS — H353132 Nonexudative age-related macular degeneration, bilateral, intermediate dry stage: Secondary | ICD-10-CM | POA: Diagnosis not present

## 2022-12-15 MED ORDER — METOPROLOL SUCCINATE ER 25 MG PO TB24: 25.0000 mg | ORAL_TABLET | Freq: Every day | ORAL | 0 refills | Status: DC

## 2022-12-15 NOTE — Telephone Encounter (Signed)
Pt's medication was sent to pt's pharmacy as requested. Confirmation received.  °

## 2022-12-15 NOTE — Telephone Encounter (Signed)
*  STAT* If patient is at the pharmacy, call can be transferred to refill team.   1. Which medications need to be refilled? (please list name of each medication and dose if known) metoprolol succinate (TOPROL-XL) 25 MG 24 hr tablet   2. Would you like to learn more about the convenience, safety, & potential cost savings by using the [facility]?No   3. Are you open to using the [facility] No   4. Which pharmacy/location (including street and city if local pharmacy) is medication to be sent to?[id number] - [place], [place] - [address]    5. Do they need a 30 day or 90 day supply? 90 Day Supply  Pt is scheduled for 11/18

## 2022-12-16 DIAGNOSIS — I428 Other cardiomyopathies: Secondary | ICD-10-CM | POA: Diagnosis not present

## 2022-12-16 DIAGNOSIS — M069 Rheumatoid arthritis, unspecified: Secondary | ICD-10-CM | POA: Diagnosis not present

## 2022-12-16 DIAGNOSIS — I5042 Chronic combined systolic (congestive) and diastolic (congestive) heart failure: Secondary | ICD-10-CM | POA: Diagnosis not present

## 2022-12-16 DIAGNOSIS — K219 Gastro-esophageal reflux disease without esophagitis: Secondary | ICD-10-CM | POA: Diagnosis not present

## 2022-12-16 DIAGNOSIS — E039 Hypothyroidism, unspecified: Secondary | ICD-10-CM | POA: Diagnosis not present

## 2022-12-16 DIAGNOSIS — M810 Age-related osteoporosis without current pathological fracture: Secondary | ICD-10-CM | POA: Diagnosis not present

## 2022-12-16 DIAGNOSIS — G8929 Other chronic pain: Secondary | ICD-10-CM | POA: Diagnosis not present

## 2022-12-16 DIAGNOSIS — G8911 Acute pain due to trauma: Secondary | ICD-10-CM | POA: Diagnosis not present

## 2022-12-16 DIAGNOSIS — I110 Hypertensive heart disease with heart failure: Secondary | ICD-10-CM | POA: Diagnosis not present

## 2022-12-23 DIAGNOSIS — K219 Gastro-esophageal reflux disease without esophagitis: Secondary | ICD-10-CM | POA: Diagnosis not present

## 2022-12-23 DIAGNOSIS — I428 Other cardiomyopathies: Secondary | ICD-10-CM | POA: Diagnosis not present

## 2022-12-23 DIAGNOSIS — G8911 Acute pain due to trauma: Secondary | ICD-10-CM | POA: Diagnosis not present

## 2022-12-23 DIAGNOSIS — M810 Age-related osteoporosis without current pathological fracture: Secondary | ICD-10-CM | POA: Diagnosis not present

## 2022-12-23 DIAGNOSIS — I5042 Chronic combined systolic (congestive) and diastolic (congestive) heart failure: Secondary | ICD-10-CM | POA: Diagnosis not present

## 2022-12-23 DIAGNOSIS — I110 Hypertensive heart disease with heart failure: Secondary | ICD-10-CM | POA: Diagnosis not present

## 2022-12-23 DIAGNOSIS — M069 Rheumatoid arthritis, unspecified: Secondary | ICD-10-CM | POA: Diagnosis not present

## 2022-12-23 DIAGNOSIS — E039 Hypothyroidism, unspecified: Secondary | ICD-10-CM | POA: Diagnosis not present

## 2022-12-23 DIAGNOSIS — G8929 Other chronic pain: Secondary | ICD-10-CM | POA: Diagnosis not present

## 2022-12-29 DIAGNOSIS — I428 Other cardiomyopathies: Secondary | ICD-10-CM | POA: Diagnosis not present

## 2022-12-29 DIAGNOSIS — E039 Hypothyroidism, unspecified: Secondary | ICD-10-CM | POA: Diagnosis not present

## 2022-12-29 DIAGNOSIS — G8929 Other chronic pain: Secondary | ICD-10-CM | POA: Diagnosis not present

## 2022-12-29 DIAGNOSIS — M069 Rheumatoid arthritis, unspecified: Secondary | ICD-10-CM | POA: Diagnosis not present

## 2022-12-29 DIAGNOSIS — G8911 Acute pain due to trauma: Secondary | ICD-10-CM | POA: Diagnosis not present

## 2022-12-29 DIAGNOSIS — I5042 Chronic combined systolic (congestive) and diastolic (congestive) heart failure: Secondary | ICD-10-CM | POA: Diagnosis not present

## 2022-12-29 DIAGNOSIS — M810 Age-related osteoporosis without current pathological fracture: Secondary | ICD-10-CM | POA: Diagnosis not present

## 2022-12-29 DIAGNOSIS — I110 Hypertensive heart disease with heart failure: Secondary | ICD-10-CM | POA: Diagnosis not present

## 2022-12-29 DIAGNOSIS — K219 Gastro-esophageal reflux disease without esophagitis: Secondary | ICD-10-CM | POA: Diagnosis not present

## 2023-01-06 ENCOUNTER — Emergency Department (HOSPITAL_COMMUNITY): Payer: Medicare PPO

## 2023-01-06 ENCOUNTER — Other Ambulatory Visit: Payer: Self-pay

## 2023-01-06 ENCOUNTER — Inpatient Hospital Stay (HOSPITAL_COMMUNITY)
Admission: EM | Admit: 2023-01-06 | Discharge: 2023-01-12 | DRG: 554 | Disposition: A | Discharge status: Skilled Nursing Facility | Payer: Medicare PPO | Attending: Internal Medicine | Admitting: Internal Medicine

## 2023-01-06 ENCOUNTER — Encounter (HOSPITAL_COMMUNITY): Payer: Self-pay

## 2023-01-06 DIAGNOSIS — E785 Hyperlipidemia, unspecified: Secondary | ICD-10-CM | POA: Diagnosis present

## 2023-01-06 DIAGNOSIS — Z7982 Long term (current) use of aspirin: Secondary | ICD-10-CM

## 2023-01-06 DIAGNOSIS — R531 Weakness: Secondary | ICD-10-CM | POA: Diagnosis present

## 2023-01-06 DIAGNOSIS — M25521 Pain in right elbow: Secondary | ICD-10-CM

## 2023-01-06 DIAGNOSIS — K529 Noninfective gastroenteritis and colitis, unspecified: Secondary | ICD-10-CM | POA: Diagnosis present

## 2023-01-06 DIAGNOSIS — M064 Inflammatory polyarthropathy: Secondary | ICD-10-CM | POA: Diagnosis present

## 2023-01-06 DIAGNOSIS — N1831 Chronic kidney disease, stage 3a: Secondary | ICD-10-CM | POA: Diagnosis present

## 2023-01-06 DIAGNOSIS — R188 Other ascites: Secondary | ICD-10-CM | POA: Diagnosis not present

## 2023-01-06 DIAGNOSIS — Z1152 Encounter for screening for COVID-19: Secondary | ICD-10-CM | POA: Diagnosis not present

## 2023-01-06 DIAGNOSIS — R918 Other nonspecific abnormal finding of lung field: Secondary | ICD-10-CM | POA: Diagnosis present

## 2023-01-06 DIAGNOSIS — Z800 Family history of malignant neoplasm of digestive organs: Secondary | ICD-10-CM

## 2023-01-06 DIAGNOSIS — Z8249 Family history of ischemic heart disease and other diseases of the circulatory system: Secondary | ICD-10-CM

## 2023-01-06 DIAGNOSIS — R231 Pallor: Secondary | ICD-10-CM | POA: Diagnosis not present

## 2023-01-06 DIAGNOSIS — I442 Atrioventricular block, complete: Secondary | ICD-10-CM | POA: Diagnosis present

## 2023-01-06 DIAGNOSIS — I700 Atherosclerosis of aorta: Secondary | ICD-10-CM | POA: Diagnosis not present

## 2023-01-06 DIAGNOSIS — I130 Hypertensive heart and chronic kidney disease with heart failure and stage 1 through stage 4 chronic kidney disease, or unspecified chronic kidney disease: Secondary | ICD-10-CM | POA: Diagnosis present

## 2023-01-06 DIAGNOSIS — R109 Unspecified abdominal pain: Secondary | ICD-10-CM

## 2023-01-06 DIAGNOSIS — R197 Diarrhea, unspecified: Secondary | ICD-10-CM

## 2023-01-06 DIAGNOSIS — K5900 Constipation, unspecified: Secondary | ICD-10-CM | POA: Diagnosis present

## 2023-01-06 DIAGNOSIS — S42309A Unspecified fracture of shaft of humerus, unspecified arm, initial encounter for closed fracture: Secondary | ICD-10-CM | POA: Diagnosis present

## 2023-01-06 DIAGNOSIS — K219 Gastro-esophageal reflux disease without esophagitis: Secondary | ICD-10-CM | POA: Diagnosis present

## 2023-01-06 DIAGNOSIS — S0990XA Unspecified injury of head, initial encounter: Secondary | ICD-10-CM | POA: Diagnosis not present

## 2023-01-06 DIAGNOSIS — R1312 Dysphagia, oropharyngeal phase: Secondary | ICD-10-CM | POA: Diagnosis not present

## 2023-01-06 DIAGNOSIS — X58XXXA Exposure to other specified factors, initial encounter: Secondary | ICD-10-CM | POA: Diagnosis present

## 2023-01-06 DIAGNOSIS — I517 Cardiomegaly: Secondary | ICD-10-CM | POA: Diagnosis not present

## 2023-01-06 DIAGNOSIS — I959 Hypotension, unspecified: Secondary | ICD-10-CM | POA: Diagnosis present

## 2023-01-06 DIAGNOSIS — M069 Rheumatoid arthritis, unspecified: Secondary | ICD-10-CM | POA: Diagnosis present

## 2023-01-06 DIAGNOSIS — I071 Rheumatic tricuspid insufficiency: Secondary | ICD-10-CM | POA: Diagnosis present

## 2023-01-06 DIAGNOSIS — I6523 Occlusion and stenosis of bilateral carotid arteries: Secondary | ICD-10-CM | POA: Diagnosis not present

## 2023-01-06 DIAGNOSIS — E039 Hypothyroidism, unspecified: Secondary | ICD-10-CM | POA: Diagnosis present

## 2023-01-06 DIAGNOSIS — Z9181 History of falling: Secondary | ICD-10-CM

## 2023-01-06 DIAGNOSIS — Z79899 Other long term (current) drug therapy: Secondary | ICD-10-CM

## 2023-01-06 DIAGNOSIS — S53124D Posterior dislocation of right ulnohumeral joint, subsequent encounter: Secondary | ICD-10-CM | POA: Diagnosis not present

## 2023-01-06 DIAGNOSIS — R112 Nausea with vomiting, unspecified: Secondary | ICD-10-CM | POA: Diagnosis not present

## 2023-01-06 DIAGNOSIS — Z950 Presence of cardiac pacemaker: Secondary | ICD-10-CM | POA: Diagnosis not present

## 2023-01-06 DIAGNOSIS — E162 Hypoglycemia, unspecified: Secondary | ICD-10-CM | POA: Diagnosis present

## 2023-01-06 DIAGNOSIS — K8020 Calculus of gallbladder without cholecystitis without obstruction: Secondary | ICD-10-CM | POA: Diagnosis not present

## 2023-01-06 DIAGNOSIS — K5732 Diverticulitis of large intestine without perforation or abscess without bleeding: Secondary | ICD-10-CM | POA: Diagnosis not present

## 2023-01-06 DIAGNOSIS — R911 Solitary pulmonary nodule: Secondary | ICD-10-CM

## 2023-01-06 DIAGNOSIS — S51011A Laceration without foreign body of right elbow, initial encounter: Secondary | ICD-10-CM | POA: Diagnosis present

## 2023-01-06 DIAGNOSIS — Z8719 Personal history of other diseases of the digestive system: Secondary | ICD-10-CM

## 2023-01-06 DIAGNOSIS — Z79890 Hormone replacement therapy (postmenopausal): Secondary | ICD-10-CM | POA: Diagnosis not present

## 2023-01-06 DIAGNOSIS — R1111 Vomiting without nausea: Secondary | ICD-10-CM | POA: Diagnosis not present

## 2023-01-06 DIAGNOSIS — Z7952 Long term (current) use of systemic steroids: Secondary | ICD-10-CM

## 2023-01-06 DIAGNOSIS — Z888 Allergy status to other drugs, medicaments and biological substances status: Secondary | ICD-10-CM

## 2023-01-06 DIAGNOSIS — Z808 Family history of malignant neoplasm of other organs or systems: Secondary | ICD-10-CM

## 2023-01-06 DIAGNOSIS — I5032 Chronic diastolic (congestive) heart failure: Secondary | ICD-10-CM | POA: Insufficient documentation

## 2023-01-06 DIAGNOSIS — I5042 Chronic combined systolic (congestive) and diastolic (congestive) heart failure: Secondary | ICD-10-CM | POA: Diagnosis present

## 2023-01-06 DIAGNOSIS — M154 Erosive (osteo)arthritis: Secondary | ICD-10-CM | POA: Diagnosis not present

## 2023-01-06 DIAGNOSIS — R262 Difficulty in walking, not elsewhere classified: Secondary | ICD-10-CM | POA: Diagnosis present

## 2023-01-06 DIAGNOSIS — Z0389 Encounter for observation for other suspected diseases and conditions ruled out: Secondary | ICD-10-CM | POA: Diagnosis not present

## 2023-01-06 DIAGNOSIS — S52001A Unspecified fracture of upper end of right ulna, initial encounter for closed fracture: Secondary | ICD-10-CM | POA: Diagnosis present

## 2023-01-06 DIAGNOSIS — Z7401 Bed confinement status: Secondary | ICD-10-CM | POA: Diagnosis not present

## 2023-01-06 DIAGNOSIS — R2689 Other abnormalities of gait and mobility: Secondary | ICD-10-CM | POA: Diagnosis not present

## 2023-01-06 DIAGNOSIS — Z803 Family history of malignant neoplasm of breast: Secondary | ICD-10-CM

## 2023-01-06 DIAGNOSIS — M6281 Muscle weakness (generalized): Secondary | ICD-10-CM | POA: Diagnosis not present

## 2023-01-06 DIAGNOSIS — E860 Dehydration: Secondary | ICD-10-CM | POA: Diagnosis not present

## 2023-01-06 DIAGNOSIS — R41841 Cognitive communication deficit: Secondary | ICD-10-CM | POA: Diagnosis not present

## 2023-01-06 DIAGNOSIS — R1311 Dysphagia, oral phase: Secondary | ICD-10-CM | POA: Diagnosis not present

## 2023-01-06 DIAGNOSIS — E872 Acidosis, unspecified: Secondary | ICD-10-CM | POA: Diagnosis present

## 2023-01-06 DIAGNOSIS — Z9049 Acquired absence of other specified parts of digestive tract: Secondary | ICD-10-CM

## 2023-01-06 DIAGNOSIS — I428 Other cardiomyopathies: Secondary | ICD-10-CM | POA: Diagnosis present

## 2023-01-06 DIAGNOSIS — R2681 Unsteadiness on feet: Secondary | ICD-10-CM | POA: Diagnosis not present

## 2023-01-06 LAB — URINALYSIS, W/ REFLEX TO CULTURE (INFECTION SUSPECTED)
Bilirubin Urine: NEGATIVE
Glucose, UA: NEGATIVE mg/dL
Hgb urine dipstick: NEGATIVE
Ketones, ur: NEGATIVE mg/dL
Leukocytes,Ua: NEGATIVE
Nitrite: NEGATIVE
Protein, ur: NEGATIVE mg/dL
Specific Gravity, Urine: 1.013 (ref 1.005–1.030)
pH: 6 (ref 5.0–8.0)

## 2023-01-06 LAB — CBC WITH DIFFERENTIAL/PLATELET
Abs Immature Granulocytes: 0.17 10*3/uL — ABNORMAL HIGH (ref 0.00–0.07)
Basophils Absolute: 0 10*3/uL (ref 0.0–0.1)
Basophils Relative: 0 %
Eosinophils Absolute: 0.4 10*3/uL (ref 0.0–0.5)
Eosinophils Relative: 2 %
HCT: 36.7 % (ref 36.0–46.0)
Hemoglobin: 11.7 g/dL — ABNORMAL LOW (ref 12.0–15.0)
Immature Granulocytes: 1 %
Lymphocytes Relative: 5 %
Lymphs Abs: 0.7 10*3/uL (ref 0.7–4.0)
MCH: 30.2 pg (ref 26.0–34.0)
MCHC: 31.9 g/dL (ref 30.0–36.0)
MCV: 94.6 fL (ref 80.0–100.0)
Monocytes Absolute: 0.8 10*3/uL (ref 0.1–1.0)
Monocytes Relative: 5 %
Neutro Abs: 13.4 10*3/uL — ABNORMAL HIGH (ref 1.7–7.7)
Neutrophils Relative %: 87 %
Platelets: 281 10*3/uL (ref 150–400)
RBC: 3.88 MIL/uL (ref 3.87–5.11)
RDW: 14.7 % (ref 11.5–15.5)
WBC: 15.5 10*3/uL — ABNORMAL HIGH (ref 4.0–10.5)
nRBC: 0 % (ref 0.0–0.2)

## 2023-01-06 LAB — COMPREHENSIVE METABOLIC PANEL
ALT: 9 U/L (ref 0–44)
AST: 27 U/L (ref 15–41)
Albumin: 2.7 g/dL — ABNORMAL LOW (ref 3.5–5.0)
Alkaline Phosphatase: 42 U/L (ref 38–126)
Anion gap: 11 (ref 5–15)
BUN: 22 mg/dL (ref 8–23)
CO2: 26 mmol/L (ref 22–32)
Calcium: 8.4 mg/dL — ABNORMAL LOW (ref 8.9–10.3)
Chloride: 102 mmol/L (ref 98–111)
Creatinine, Ser: 1.31 mg/dL — ABNORMAL HIGH (ref 0.44–1.00)
GFR, Estimated: 39 mL/min — ABNORMAL LOW (ref >60)
Glucose, Bld: 65 mg/dL — ABNORMAL LOW (ref 70–99)
Potassium: 3.9 mmol/L (ref 3.5–5.1)
Sodium: 139 mmol/L (ref 135–145)
Total Bilirubin: 1 mg/dL (ref 0.3–1.2)
Total Protein: 5 g/dL — ABNORMAL LOW (ref 6.5–8.1)

## 2023-01-06 LAB — I-STAT CG4 LACTIC ACID, ED
Lactic Acid, Venous: 4.2 mmol/L (ref 0.5–1.9)
Lactic Acid, Venous: 2.1 mmol/L (ref 0.5–1.9)

## 2023-01-06 LAB — RESP PANEL BY RT-PCR (RSV, FLU A&B, COVID)  RVPGX2
Influenza A by PCR: NEGATIVE
Influenza B by PCR: NEGATIVE
Resp Syncytial Virus by PCR: NEGATIVE
SARS Coronavirus 2 by RT PCR: NEGATIVE

## 2023-01-06 LAB — PROTIME-INR
INR: 1.1 (ref 0.8–1.2)
Prothrombin Time: 14.3 s (ref 11.4–15.2)

## 2023-01-06 LAB — APTT: aPTT: 21 s — ABNORMAL LOW (ref 24–36)

## 2023-01-06 MED ORDER — LACTATED RINGERS IV BOLUS (SEPSIS)
1000.0000 mL | Freq: Once | INTRAVENOUS | Status: AC
Start: 2023-01-06 — End: 2023-01-06
  Administered 2023-01-06: 1000 mL via INTRAVENOUS

## 2023-01-06 MED ORDER — SODIUM CHLORIDE 0.9% FLUSH
10.0000 mL | Freq: Two times a day (BID) | INTRAVENOUS | Status: DC
  Administered 2023-01-06 – 2023-01-12 (×13): 10 mL via INTRAVENOUS

## 2023-01-06 MED ORDER — SODIUM CHLORIDE 0.9 % IV SOLN
1.0000 g | INTRAVENOUS | Status: DC
  Administered 2023-01-06: 1 g via INTRAVENOUS
  Filled 2023-01-06: qty 10

## 2023-01-06 NOTE — ED Notes (Signed)
ED TO INPATIENT HANDOFF REPORT  ED Nurse Name and Phone #: [phone]  S Name/Age/Gender Pamela Dunn 89 y.o. female Room/Bed: 024C/024C  Code Status   Code Status: Prior  Home/SNF/Other Home Patient oriented to: self, place, time, and situation Is this baseline? Yes   Triage Complete: Triage complete  Chief Complaint Colitis [K52.9]  Triage Note [facility] from d/t N/V for a few days, weak, hypotensive for EMS 60/30. Improved to 80/40 with fluids NS. 20g LFA. HR 70 paced. Large skin tear to R FA. Pt denies fall but is unable to explain the tear.   Allergies Allergies  Allergen Reactions   Coreg [Carvedilol] Diarrhea   Lorazepam Diarrhea    Level of Care/Admitting Diagnosis ED Disposition     ED Disposition  Admit   Condition  --   Comment  Hospital Area: [facility] [[id number]]  Level of Care: Telemetry Medical [104]  May place patient in observation at [facility] or [facility] if equivalent level of care is available:: Yes  Covid Evaluation: Asymptomatic - no recent exposure (last 10 days) testing not required  Diagnosis: Colitis [[id number]]  Admitting Physician: John Giovanni [[id number]]  Attending Physician: John Giovanni [[id number]]          B Medical/Surgery History Past Medical History:  Diagnosis Date   Allergy    seasonal   Anemia    Arthritis    Encounter for care of pacemaker 07/12/2020   GERD (gastroesophageal reflux disease)    Heart block AV complete (HCC) 07/11/2020   Hyperlipidemia    Hypertension    Pacemaker Abbott Assurity MRI model [id number] 07/12/2020 07/12/2020   Past Surgical History:  Procedure Laterality Date   APPENDECTOMY     BACK SURGERY     BOWEL RESECTION N/A 08/24/2019   Procedure: SMALL BOWEL RESECTION;  Surgeon: Abigail Miyamoto, MD;  Location: [facility] ORS;  Service: General;  Laterality: N/A;   LAPAROSCOPY N/A 08/24/2019   Procedure: LAPAROSCOPY DIAGNOSTIC;  Surgeon: Abigail Miyamoto, MD;  Location: [facility]  ORS;  Service: General;  Laterality: N/A;   LAPAROTOMY N/A 08/24/2019   Procedure: EXPLORATORY LAPAROTOMY;  Surgeon: Abigail Miyamoto, MD;  Location: [facility] ORS;  Service: General;  Laterality: N/A;   PACEMAKER IMPLANT N/A 07/12/2020   Procedure: PACEMAKER IMPLANT;  Surgeon: Hillis Range, MD;  Location: [facility] INVASIVE CV LAB;  Service: Cardiovascular;  Laterality: N/A;     A IV Location/Drains/Wounds Patient Lines/Drains/Airways Status     Active Line/Drains/Airways     Name Placement date Placement time Site Days   Peripheral IV 01/06/23 20 G Left Forearm 01/06/23  --  Forearm  less than 1   Peripheral IV 01/06/23 18 G Right Antecubital 01/06/23  1556  Antecubital  less than 1   Wound / Incision (Open or Dehisced) 01/06/23 Arm Right;Lateral;Mid 01/06/23  1527  Arm  less than 1            Intake/Output Last 24 hours  Intake/Output Summary (Last 24 hours) at 01/06/2023 2359 Last data filed at 01/06/2023 1755 Gross per 24 hour  Intake 1580 ml  Output --  Net 1580 ml    Labs/Imaging Results for orders placed or performed during the hospital encounter of 01/06/23 (from the past 48 hour(s))  Resp panel by RT-PCR (RSV, Flu A&B, Covid)     Status: None   Collection Time: 01/06/23  3:43 PM   Specimen: Nasal Swab  Result Value Ref Range   SARS Coronavirus 2 by RT PCR NEGATIVE NEGATIVE  Influenza A by PCR NEGATIVE NEGATIVE   Influenza B by PCR NEGATIVE NEGATIVE    Comment: (NOTE) The Xpert Xpress SARS-CoV-2/FLU/RSV plus assay is intended as an aid in the diagnosis of influenza from Nasopharyngeal swab specimens and should not be used as a sole basis for treatment. Nasal washings and aspirates are unacceptable for Xpert Xpress SARS-CoV-2/FLU/RSV testing.  Fact Sheet for Patients: BloggerCourse.com  Fact Sheet for Healthcare Providers: SeriousBroker.it  This test is not yet approved or cleared by the [place] FDA and has been  authorized for detection and/or diagnosis of SARS-CoV-2 by FDA under an Emergency Use Authorization (EUA). This EUA will remain in effect (meaning this test can be used) for the duration of the COVID-19 declaration under Section 564(b)(1) of the Act, 21 U.S.C. section 360bbb-3(b)(1), unless the authorization is terminated or revoked.     Resp Syncytial Virus by PCR NEGATIVE NEGATIVE    Comment: (NOTE) Fact Sheet for Patients: BloggerCourse.com  Fact Sheet for Healthcare Providers: SeriousBroker.it  This test is not yet approved or cleared by the [place] FDA and has been authorized for detection and/or diagnosis of SARS-CoV-2 by FDA under an Emergency Use Authorization (EUA). This EUA will remain in effect (meaning this test can be used) for the duration of the COVID-19 declaration under Section 564(b)(1) of the Act, 21 U.S.C. section 360bbb-3(b)(1), unless the authorization is terminated or revoked.  Performed at [facility], [address], [place], [place] [address]   Comprehensive metabolic panel     Status: Abnormal   Collection Time: 01/06/23  3:43 PM  Result Value Ref Range   Sodium 139 135 - 145 mmol/L   Potassium 3.9 3.5 - 5.1 mmol/L    Comment: HEMOLYSIS AT THIS LEVEL MAY AFFECT RESULT   Chloride 102 98 - 111 mmol/L   CO2 26 22 - 32 mmol/L   Glucose, Bld 65 (L) 70 - 99 mg/dL    Comment: Glucose reference range applies only to samples taken after fasting for at least 8 hours.   BUN 22 8 - 23 mg/dL   Creatinine, Ser 1.91 (H) 0.44 - 1.00 mg/dL   Calcium 8.4 (L) 8.9 - 10.3 mg/dL   Total Protein 5.0 (L) 6.5 - 8.1 g/dL   Albumin 2.7 (L) 3.5 - 5.0 g/dL   AST 27 15 - 41 U/L    Comment: HEMOLYSIS AT THIS LEVEL MAY AFFECT RESULT   ALT 9 0 - 44 U/L    Comment: HEMOLYSIS AT THIS LEVEL MAY AFFECT RESULT   Alkaline Phosphatase 42 38 - 126 U/L   Total Bilirubin 1.0 0.3 - 1.2 mg/dL    Comment: HEMOLYSIS AT THIS  LEVEL MAY AFFECT RESULT   GFR, Estimated 39 (L) >60 mL/min    Comment: (NOTE) Calculated using the CKD-EPI Creatinine Equation (2021)    Anion gap 11 5 - 15    Comment: Performed at [facility], [address], [place], [place] [address]  CBC with Differential     Status: Abnormal   Collection Time: 01/06/23  3:43 PM  Result Value Ref Range   WBC 15.5 (H) 4.0 - 10.5 K/uL   RBC 3.88 3.87 - 5.11 MIL/uL   Hemoglobin 11.7 (L) 12.0 - 15.0 g/dL   HCT 56.2 13.0 - 86.5 %   MCV 94.6 80.0 - 100.0 fL   MCH 30.2 26.0 - 34.0 pg   MCHC 31.9 30.0 - 36.0 g/dL   RDW 78.4 69.6 - 29.5 %  Platelets 281 150 - 400 K/uL   nRBC 0.0 0.0 - 0.2 %   Neutrophils Relative % 87 %   Neutro Abs 13.4 (H) 1.7 - 7.7 K/uL   Lymphocytes Relative 5 %   Lymphs Abs 0.7 0.7 - 4.0 K/uL   Monocytes Relative 5 %   Monocytes Absolute 0.8 0.1 - 1.0 K/uL   Eosinophils Relative 2 %   Eosinophils Absolute 0.4 0.0 - 0.5 K/uL   Basophils Relative 0 %   Basophils Absolute 0.0 0.0 - 0.1 K/uL   Immature Granulocytes 1 %   Abs Immature Granulocytes 0.17 (H) 0.00 - 0.07 K/uL    Comment: Performed at [facility], [address], [place], [place] [address]  Protime-INR     Status: None   Collection Time: 01/06/23  3:43 PM  Result Value Ref Range   Prothrombin Time 14.3 11.4 - 15.2 seconds   INR 1.1 0.8 - 1.2    Comment: (NOTE) INR goal varies based on device and disease states. Performed at [facility], [address], [place], [place] [address]   APTT     Status: Abnormal   Collection Time: 01/06/23  3:43 PM  Result Value Ref Range   aPTT 21 (L) 24 - 36 seconds    Comment: Performed at [facility], [address], [place], [place] [address]  I-Stat Lactic Acid, ED     Status: Abnormal   Collection Time: 01/06/23  4:29 PM  Result Value Ref Range   Lactic Acid, Venous 4.2 (HH) 0.5 - 1.9 mmol/L   Comment NOTIFIED PHYSICIAN   I-Stat Lactic Acid, ED     Status: Abnormal   Collection Time:  01/06/23  5:42 PM  Result Value Ref Range   Lactic Acid, Venous 2.1 (HH) 0.5 - 1.9 mmol/L   Comment NOTIFIED PHYSICIAN   Urinalysis, w/ Reflex to Culture (Infection Suspected) -Urine, Clean Catch     Status: Abnormal   Collection Time: 01/06/23 10:09 PM  Result Value Ref Range   Specimen Source URINE, CLEAN CATCH    Color, Urine YELLOW YELLOW   APPearance CLEAR CLEAR   Specific Gravity, Urine 1.013 1.005 - 1.030   pH 6.0 5.0 - 8.0   Glucose, UA NEGATIVE NEGATIVE mg/dL   Hgb urine dipstick NEGATIVE NEGATIVE   Bilirubin Urine NEGATIVE NEGATIVE   Ketones, ur NEGATIVE NEGATIVE mg/dL   Protein, ur NEGATIVE NEGATIVE mg/dL   Nitrite NEGATIVE NEGATIVE   Leukocytes,Ua NEGATIVE NEGATIVE   RBC / HPF 0-5 0 - 5 RBC/hpf   WBC, UA 0-5 0 - 5 WBC/hpf    Comment:        Reflex urine culture not performed if WBC <=10, OR if Squamous epithelial cells >5. If Squamous epithelial cells >5 suggest recollection.    Bacteria, UA RARE (A) NONE SEEN   Squamous Epithelial / HPF 0-5 0 - 5 /HPF   Mucus PRESENT    Hyaline Casts, UA PRESENT     Comment: Performed at [facility], [address], [place], [place] [address]   CT ABDOMEN PELVIS WO CONTRAST  Result Date: 01/06/2023 CLINICAL DATA:  Hypotension, nausea, and suspected small-bowel obstruction. Previous history of small-bowel obstruction. Questionable sepsis. EXAM: CT ABDOMEN AND PELVIS WITHOUT CONTRAST TECHNIQUE: Multidetector CT imaging of the abdomen and pelvis was performed following the standard protocol without IV contrast. RADIATION DOSE REDUCTION: This exam was performed according to the departmental dose-optimization program which includes automated exposure control, adjustment of  the mA and/or kV according to patient size and/or use of iterative reconstruction technique. COMPARISON:  CTs with IV contrast 09/04/2022 and 08/23/2019. FINDINGS: Lower chest: There are trace pleural effusions. Subpleural linear atelectasis in the lower lobes.  Mild chronic elevation right hemidiaphragm. New from 2021 but stable since 09/04/2022, there is a 6 mm right middle lobe noncalcified nodule on 4:2, a 5 mm stable noncalcified right lower lobe anterior basal segment nodule on 4:9, and adjacent 2 mm subpleural right middle lobe nodules on 4:7. Four months of stability is confirmed but there could be other nodules above the plane of current and prior imaging. Chest CT is recommended to assess for additional nodules. No focal consolidation is seen. There is mild diffuse bronchial thickening. Mild cardiomegaly is unchanged. No pericardial effusion. There is metal artifact from pacemaker wiring in the right heart. Coronary artery calcifications. Small hiatal hernia. Hepatobiliary: No liver masses seen without contrast. There are occasional calcified granulomas. There are they few small stones layering in the proximal gallbladder but no wall thickening no biliary dilatation. Pancreas: Partially atrophic. Otherwise unremarkable without contrast. Spleen: No abnormality is seen without contrast. Adrenals/Urinary Tract: There is no adrenal mass. Bilateral renal cortical thinning is again noted. A 1.7 cm Bosniak 1 cyst is again seen in the midpole left kidney anteriorly, Hounsfield density is 2. No follow-up imaging is recommended. No other contour deforming deformity abnormality is seen of the kidneys. There is no urinary stone or obstruction. The bladder is unremarkable for the degree of distention. Stomach/Bowel: Interval increased thickening at the GE junction warranting endoscopic follow-up. Rest of the gastric wall is contracted. There is a mildly dilated postsurgical small bowel segment in the midline to right paramidline lower abdomen, measuring 3.6 cm caliber. Remainder of the small bowel is normal caliber. This could be due to a low-grade partial small bowel obstruction along the surgical segment or possibly due to ileus. An appendix is not seen in this patient.  There is either wall thickening or underdistention in the proximal transverse colon to the midline, with colitis favored as there are hazy reactive mesenteric inflammatory changes in the area. There is mild fecal stasis in the distal transverse, descending and rectosigmoid colon, without evidence of colitis. There is uncomplicated sigmoid diverticulitis. A moderate-sized stool ball distends the rectum but there is no wall thickening. Vascular/Lymphatic: Extensive aortoiliac and visceral abdominal arterial calcific plaques. No AAA. No adenopathy is seen. Reproductive: Uterus and bilateral adnexa are unremarkable. Other: Small umbilical fat hernia. No incarcerated hernia. Trace ascites in the posterior pelvis and subhepatic space. No drainable pocket is seen. There is no free air, free hemorrhage or localizing collection. Musculoskeletal: Osteopenia and advanced degenerative change lower thoracic and lumbar spine. Bone-on-bone superior joint space loss of both hips with acetabular osteophytes. Acquired spinal stenosis again noted L1-2, L2-3, and L3-4 with multilevel lumbar foraminal stenosis. No suspicious regional osseous lesions. IMPRESSION: 1. Mildly dilated postsurgical small bowel segment in the midline to right paramidline lower abdomen, measuring 3.6 cm caliber. This could be due to a low-grade partial small bowel obstruction along the surgical segment or possibly due to ileus. 2. Proximal to mid transverse colon wall thickening or underdistention, with colitis favored. 3. Constipation and uncomplicated sigmoid diverticulitis. 4. Trace ascites. No drainable pocket. 5. Trace pleural effusions with bibasilar atelectasis. 6. Cholelithiasis. 7. Aortic and coronary artery atherosclerosis. 8. Osteopenia and advanced degenerative change. 9. 6 mm and 5 mm right lung base nodules are stable since 09/04/2022 but new since 2021.  Nonemergent chest CT is recommended to look for other nodules. Aortic Atherosclerosis  (ICD10-I70.0). Electronically Signed   By: Almira Bar M.D.   On: 01/06/2023 21:03   DG Elbow Complete Left  Result Date: 01/06/2023 CLINICAL DATA:  Hypotension. Possible fall. Large skin tear to right forearm. Unclear etiology. Unable to right elbow. EXAM: LEFT ELBOW - COMPLETE 3+ VIEW; RIGHT ELBOW - COMPLETE 3+ VIEW COMPARISON:  Radiographs 01/06/2009 FINDINGS: Right: Severe erosive arthritic changes are redemonstrated. There is posterior displacement of the ulna in relation to the humerus. Large 3.7 cm osseous fragment anterior to the humerus compatible with age-indeterminate fracture fragment. Left: Severe erosive changes. Plate and screw fixation distal humerus. No definite acute fracture. No dislocation. IMPRESSION: 1. Posterior displacement of the right ulna in relation to the humerus. 2. Large 3.7 cm osseous fragment anterior to the right humerus compatible with age-indeterminate fracture fragment. 3. Severe erosive arthritic changes bilaterally. Electronically Signed   By: Minerva Fester M.D.   On: 01/06/2023 20:08   DG Elbow Complete Right  Result Date: 01/06/2023 CLINICAL DATA:  Hypotension. Possible fall. Large skin tear to right forearm. Unclear etiology. Unable to right elbow. EXAM: LEFT ELBOW - COMPLETE 3+ VIEW; RIGHT ELBOW - COMPLETE 3+ VIEW COMPARISON:  Radiographs 01/06/2009 FINDINGS: Right: Severe erosive arthritic changes are redemonstrated. There is posterior displacement of the ulna in relation to the humerus. Large 3.7 cm osseous fragment anterior to the humerus compatible with age-indeterminate fracture fragment. Left: Severe erosive changes. Plate and screw fixation distal humerus. No definite acute fracture. No dislocation. IMPRESSION: 1. Posterior displacement of the right ulna in relation to the humerus. 2. Large 3.7 cm osseous fragment anterior to the right humerus compatible with age-indeterminate fracture fragment. 3. Severe erosive arthritic changes bilaterally.  Electronically Signed   By: Minerva Fester M.D.   On: 01/06/2023 20:08   DG Chest Port 1 View  Result Date: 01/06/2023 CLINICAL DATA:  Questionable sepsis-evaluate for abnormality, nausea, vomiting, weakness, hypotension EXAM: PORTABLE CHEST 1 VIEW COMPARISON:  03/04/2022 FINDINGS: Stable cardiomegaly. Aortic atherosclerotic calcification. Left midlung scarring. Left basilar atelectasis. No focal consolidation, pleural effusion, or pneumothorax. No displaced rib fractures. Left chest wall pacemaker. Left TSA. IMPRESSION: No active disease. Electronically Signed   By: Minerva Fester M.D.   On: 01/06/2023 20:03   CT Head Wo Contrast  Result Date: 01/06/2023 CLINICAL DATA:  Head trauma EXAM: CT HEAD WITHOUT CONTRAST TECHNIQUE: Contiguous axial images were obtained from the base of the skull through the vertex without intravenous contrast. RADIATION DOSE REDUCTION: This exam was performed according to the departmental dose-optimization program which includes automated exposure control, adjustment of the mA and/or kV according to patient size and/or use of iterative reconstruction technique. COMPARISON:  None Available. FINDINGS: Brain: There is no mass, hemorrhage or extra-axial collection. There is generalized atrophy without lobar predilection. Hypodensity of the white matter is most commonly associated with chronic microvascular disease. Old anterior right MCA territory infarct. Vascular: Atherosclerotic calcification of the vertebral and internal carotid arteries at the skull base. No abnormal hyperdensity of the major intracranial arteries or dural venous sinuses. Skull: The visualized skull base, calvarium and extracranial soft tissues are normal. Sinuses/Orbits: No fluid levels or advanced mucosal thickening of the visualized paranasal sinuses. No mastoid or middle ear effusion. Normal orbits. IMPRESSION: 1. No acute intracranial abnormality. 2. Old anterior right MCA territory infarct and findings of  chronic microvascular disease. Electronically Signed   By: Deatra Robinson M.D.   On: 01/06/2023 19:08  Pending Labs Unresulted Labs (From admission, onward)     Start     Ordered   01/06/23 1543  Blood Culture (routine x 2)  (Septic presentation on arrival (screening labs, nursing and treatment orders for obvious sepsis))  BLOOD CULTURE X 2,   STAT      01/06/23 1543            Vitals/Pain Today's Vitals   01/06/23 1730 01/06/23 1800 01/06/23 1830 01/06/23 2208  BP: 126/64 137/60 (!) 113/50 (!) 126/56  Pulse:  67 66 75  Resp: 18 20 14 12   Temp:    98.1 F (36.7 C)  TempSrc:    Oral  SpO2: 100% 100% 100% 100%  Weight:      Height:      PainSc:        Isolation Precautions Airborne and Contact precautions  Medications Medications  sodium chloride flush (NS) 0.9 % injection 10 mL (10 mLs Intravenous Given 01/06/23 2207)  cefTRIAXone (ROCEPHIN) 1 g in sodium chloride 0.9 % 100 mL IVPB (0 g Intravenous Stopped 01/06/23 1658)  lactated ringers bolus 1,000 mL (0 mLs Intravenous Stopped 01/06/23 1630)    Mobility walks with device     Focused Assessments PT A&O. Family at bedside. PT right arm splinted.   R Recommendations: See Admitting Provider Note  Report given to:   Additional Notes: Call if further information is needed

## 2023-01-06 NOTE — ED Notes (Incomplete)
ED TO INPATIENT HANDOFF REPORT  ED Nurse Name and Phone #: Daneesha Quinteros,  S Name/Age/Gender Pamela Dunn 89 y.o. female Room/Bed: 024C/024C  Code Status   Code Status: Prior  Home/SNF/Other Home Patient oriented to: self, place, time, and situation Is this baseline? Yes   Triage Complete: Triage complete  Chief Complaint Colitis [K52.9]  Triage Note [facility] from d/t N/V for a few days, weak, hypotensive for EMS 60/30. Improved to 80/40 with fluids NS. 20g LFA. HR 70 paced. Large skin tear to R FA. Pt denies fall but is unable to explain the tear.   Allergies Allergies  Allergen Reactions  . Coreg [Carvedilol] Diarrhea  . Lorazepam Diarrhea    Level of Care/Admitting Diagnosis ED Disposition     ED Disposition  Admit   Condition  --   Comment  Hospital Area: [facility] [[id number]]  Level of Care: Telemetry Medical [104]  May place patient in observation at [facility] or [facility] if equivalent level of care is available:: Yes  Covid Evaluation: Asymptomatic - no recent exposure (last 10 days) testing not required  Diagnosis: Colitis [[id number]]  Admitting Physician: John Giovanni [[id number]]  Attending Physician: John Giovanni [[id number]]          B Medical/Surgery History Past Medical History:  Diagnosis Date  . Allergy    seasonal  . Anemia   . Arthritis   . Encounter for care of pacemaker 07/12/2020  . GERD (gastroesophageal reflux disease)   . Heart block AV complete (HCC) 07/11/2020  . Hyperlipidemia   . Hypertension   . Pacemaker Abbott Assurity MRI model [id number] 07/12/2020 07/12/2020   Past Surgical History:  Procedure Laterality Date  . APPENDECTOMY    . BACK SURGERY    . BOWEL RESECTION N/A 08/24/2019   Procedure: SMALL BOWEL RESECTION;  Surgeon: Abigail Miyamoto, MD;  Location: [facility];  Service: General;  Laterality: N/A;  . LAPAROSCOPY N/A 08/24/2019   Procedure: LAPAROSCOPY DIAGNOSTIC;  Surgeon: Abigail Miyamoto, MD;   Location: [facility];  Service: General;  Laterality: N/A;  . LAPAROTOMY N/A 08/24/2019   Procedure: EXPLORATORY LAPAROTOMY;  Surgeon: Abigail Miyamoto, MD;  Location: [facility];  Service: General;  Laterality: N/A;  . PACEMAKER IMPLANT N/A 07/12/2020   Procedure: PACEMAKER IMPLANT;  Surgeon: Hillis Range, MD;  Location: [facility];  Service: Cardiovascular;  Laterality: N/A;     A IV Location/Drains/Wounds Patient Lines/Drains/Airways Status     Active Line/Drains/Airways     Name Placement date Placement time Site Days   Peripheral IV 01/06/23 20 G Left Forearm 01/06/23  --  Forearm  less than 1   Peripheral IV 01/06/23 18 G Right Antecubital 01/06/23  1556  Antecubital  less than 1   Wound / Incision (Open or Dehisced) 01/06/23 Arm Right;Lateral;Mid 01/06/23  1527  Arm  less than 1            Intake/Output Last 24 hours  Intake/Output Summary (Last 24 hours) at 01/06/2023 2359 Last data filed at 01/06/2023 1755 Gross per 24 hour  Intake 1580 ml  Output --  Net 1580 ml    Labs/Imaging Results for orders placed or performed during the hospital encounter of 01/06/23 (from the past 48 hour(s))  Resp panel by RT-PCR (RSV, Flu A&B, Covid)     Status: None   Collection Time: 01/06/23  3:43 PM   Specimen: Nasal Swab  Result Value Ref Range   SARS Coronavirus 2 by RT PCR NEGATIVE NEGATIVE  Influenza A by PCR NEGATIVE NEGATIVE   Influenza B by PCR NEGATIVE NEGATIVE    Comment: (NOTE) The Xpert Xpress SARS-CoV-2/FLU/RSV plus assay is intended as an aid in the diagnosis of influenza from Nasopharyngeal swab specimens and should not be used as a sole basis for treatment. Nasal washings and aspirates are unacceptable for Xpert Xpress SARS-CoV-2/FLU/RSV testing.  Fact Sheet for Patients: BloggerCourse.com  Fact Sheet for Healthcare Providers: SeriousBroker.it  This test is not yet approved or cleared by the [place]  FDA and has been authorized for detection and/or diagnosis of SARS-CoV-2 by FDA under an Emergency Use Authorization (EUA). This EUA will remain in effect (meaning this test can be used) for the duration of the COVID-19 declaration under Section 564(b)(1) of the Act, 21 U.S.C. section 360bbb-3(b)(1), unless the authorization is terminated or revoked.     Resp Syncytial Virus by PCR NEGATIVE NEGATIVE    Comment: (NOTE) Fact Sheet for Patients: BloggerCourse.com  Fact Sheet for Healthcare Providers: SeriousBroker.it  This test is not yet approved or cleared by the [place] FDA and has been authorized for detection and/or diagnosis of SARS-CoV-2 by FDA under an Emergency Use Authorization (EUA). This EUA will remain in effect (meaning this test can be used) for the duration of the COVID-19 declaration under Section 564(b)(1) of the Act, 21 U.S.C. section 360bbb-3(b)(1), unless the authorization is terminated or revoked.  Performed at [facility], [address], [place], [place] [address]   Comprehensive metabolic panel     Status: Abnormal   Collection Time: 01/06/23  3:43 PM  Result Value Ref Range   Sodium 139 135 - 145 mmol/L   Potassium 3.9 3.5 - 5.1 mmol/L    Comment: HEMOLYSIS AT THIS LEVEL MAY AFFECT RESULT   Chloride 102 98 - 111 mmol/L   CO2 26 22 - 32 mmol/L   Glucose, Bld 65 (L) 70 - 99 mg/dL    Comment: Glucose reference range applies only to samples taken after fasting for at least 8 hours.   BUN 22 8 - 23 mg/dL   Creatinine, Ser 4.69 (H) 0.44 - 1.00 mg/dL   Calcium 8.4 (L) 8.9 - 10.3 mg/dL   Total Protein 5.0 (L) 6.5 - 8.1 g/dL   Albumin 2.7 (L) 3.5 - 5.0 g/dL   AST 27 15 - 41 U/L    Comment: HEMOLYSIS AT THIS LEVEL MAY AFFECT RESULT   ALT 9 0 - 44 U/L    Comment: HEMOLYSIS AT THIS LEVEL MAY AFFECT RESULT   Alkaline Phosphatase 42 38 - 126 U/L   Total Bilirubin 1.0 0.3 - 1.2 mg/dL    Comment:  HEMOLYSIS AT THIS LEVEL MAY AFFECT RESULT   GFR, Estimated 39 (L) >60 mL/min    Comment: (NOTE) Calculated using the CKD-EPI Creatinine Equation (2021)    Anion gap 11 5 - 15    Comment: Performed at [facility], [address] [place] [address], [place], [place] [address]  CBC with Differential     Status: Abnormal   Collection Time: 01/06/23  3:43 PM  Result Value Ref Range   WBC 15.5 (H) 4.0 - 10.5 K/uL   RBC 3.88 3.87 - 5.11 MIL/uL   Hemoglobin 11.7 (L) 12.0 - 15.0 g/dL   HCT 84.1 32.4 - 40.1 %   MCV 94.6 80.0 - 100.0 fL   MCH 30.2 26.0 - 34.0 pg   MCHC 31.9 30.0 - 36.0 g/dL   RDW 02.7 25.3 - 66.4 %  Platelets 281 150 - 400 K/uL   nRBC 0.0 0.0 - 0.2 %   Neutrophils Relative % 87 %   Neutro Abs 13.4 (H) 1.7 - 7.7 K/uL   Lymphocytes Relative 5 %   Lymphs Abs 0.7 0.7 - 4.0 K/uL   Monocytes Relative 5 %   Monocytes Absolute 0.8 0.1 - 1.0 K/uL   Eosinophils Relative 2 %   Eosinophils Absolute 0.4 0.0 - 0.5 K/uL   Basophils Relative 0 %   Basophils Absolute 0.0 0.0 - 0.1 K/uL   Immature Granulocytes 1 %   Abs Immature Granulocytes 0.17 (H) 0.00 - 0.07 K/uL    Comment: Performed at [facility], [address], [place], [place] [address]  Protime-INR     Status: None   Collection Time: 01/06/23  3:43 PM  Result Value Ref Range   Prothrombin Time 14.3 11.4 - 15.2 seconds   INR 1.1 0.8 - 1.2    Comment: (NOTE) INR goal varies based on device and disease states. Performed at [facility], [address], [place], [place] [address]   APTT     Status: Abnormal   Collection Time: 01/06/23  3:43 PM  Result Value Ref Range   aPTT 21 (L) 24 - 36 seconds    Comment: Performed at [facility], [address] [place], [place] [address]  I-Stat Lactic Acid, ED     Status: Abnormal   Collection Time: 01/06/23  4:29 PM  Result Value Ref Range   Lactic Acid, Venous 4.2 (HH) 0.5 - 1.9 mmol/L   Comment NOTIFIED PHYSICIAN   I-Stat Lactic Acid, ED     Status: Abnormal    Collection Time: 01/06/23  5:42 PM  Result Value Ref Range   Lactic Acid, Venous 2.1 (HH) 0.5 - 1.9 mmol/L   Comment NOTIFIED PHYSICIAN   Urinalysis, w/ Reflex to Culture (Infection Suspected) -Urine, Clean Catch     Status: Abnormal   Collection Time: 01/06/23 10:09 PM  Result Value Ref Range   Specimen Source URINE, CLEAN CATCH    Color, Urine YELLOW YELLOW   APPearance CLEAR CLEAR   Specific Gravity, Urine 1.013 1.005 - 1.030   pH 6.0 5.0 - 8.0   Glucose, UA NEGATIVE NEGATIVE mg/dL   Hgb urine dipstick NEGATIVE NEGATIVE   Bilirubin Urine NEGATIVE NEGATIVE   Ketones, ur NEGATIVE NEGATIVE mg/dL   Protein, ur NEGATIVE NEGATIVE mg/dL   Nitrite NEGATIVE NEGATIVE   Leukocytes,Ua NEGATIVE NEGATIVE   RBC / HPF 0-5 0 - 5 RBC/hpf   WBC, UA 0-5 0 - 5 WBC/hpf    Comment:        Reflex urine culture not performed if WBC <=10, OR if Squamous epithelial cells >5. If Squamous epithelial cells >5 suggest recollection.    Bacteria, UA RARE (A) NONE SEEN   Squamous Epithelial / HPF 0-5 0 - 5 /HPF   Mucus PRESENT    Hyaline Casts, UA PRESENT     Comment: Performed at [facility], [address] [place], [place] [address]   CT ABDOMEN PELVIS WO CONTRAST  Result Date: 01/06/2023 CLINICAL DATA:  Hypotension, nausea, and suspected small-bowel obstruction. Previous history of small-bowel obstruction. Questionable sepsis. EXAM: CT ABDOMEN AND PELVIS WITHOUT CONTRAST TECHNIQUE: Multidetector CT imaging of the abdomen and pelvis was performed following the standard protocol without IV contrast. RADIATION DOSE REDUCTION: This exam was performed according to the departmental dose-optimization program which includes automated exposure control, adjustment of  the mA and/or kV according to patient size and/or use of iterative reconstruction technique. COMPARISON:  CTs with IV contrast 09/04/2022 and 08/23/2019. FINDINGS: Lower chest: There are trace pleural effusions. Subpleural linear atelectasis in  the lower lobes. Mild chronic elevation right hemidiaphragm. New from 2021 but stable since 09/04/2022, there is a 6 mm right middle lobe noncalcified nodule on 4:2, a 5 mm stable noncalcified right lower lobe anterior basal segment nodule on 4:9, and adjacent 2 mm subpleural right middle lobe nodules on 4:7. Four months of stability is confirmed but there could be other nodules above the plane of current and prior imaging. Chest CT is recommended to assess for additional nodules. No focal consolidation is seen. There is mild diffuse bronchial thickening. Mild cardiomegaly is unchanged. No pericardial effusion. There is metal artifact from pacemaker wiring in the right heart. Coronary artery calcifications. Small hiatal hernia. Hepatobiliary: No liver masses seen without contrast. There are occasional calcified granulomas. There are they few small stones layering in the proximal gallbladder but no wall thickening no biliary dilatation. Pancreas: Partially atrophic. Otherwise unremarkable without contrast. Spleen: No abnormality is seen without contrast. Adrenals/Urinary Tract: There is no adrenal mass. Bilateral renal cortical thinning is again noted. A 1.7 cm Bosniak 1 cyst is again seen in the midpole left kidney anteriorly, Hounsfield density is 2. No follow-up imaging is recommended. No other contour deforming deformity abnormality is seen of the kidneys. There is no urinary stone or obstruction. The bladder is unremarkable for the degree of distention. Stomach/Bowel: Interval increased thickening at the GE junction warranting endoscopic follow-up. Rest of the gastric wall is contracted. There is a mildly dilated postsurgical small bowel segment in the midline to right paramidline lower abdomen, measuring 3.6 cm caliber. Remainder of the small bowel is normal caliber. This could be due to a low-grade partial small bowel obstruction along the surgical segment or possibly due to ileus. An appendix is not seen in  this patient. There is either wall thickening or underdistention in the proximal transverse colon to the midline, with colitis favored as there are hazy reactive mesenteric inflammatory changes in the area. There is mild fecal stasis in the distal transverse, descending and rectosigmoid colon, without evidence of colitis. There is uncomplicated sigmoid diverticulitis. A moderate-sized stool ball distends the rectum but there is no wall thickening. Vascular/Lymphatic: Extensive aortoiliac and visceral abdominal arterial calcific plaques. No AAA. No adenopathy is seen. Reproductive: Uterus and bilateral adnexa are unremarkable. Other: Small umbilical fat hernia. No incarcerated hernia. Trace ascites in the posterior pelvis and subhepatic space. No drainable pocket is seen. There is no free air, free hemorrhage or localizing collection. Musculoskeletal: Osteopenia and advanced degenerative change lower thoracic and lumbar spine. Bone-on-bone superior joint space loss of both hips with acetabular osteophytes. Acquired spinal stenosis again noted L1-2, L2-3, and L3-4 with multilevel lumbar foraminal stenosis. No suspicious regional osseous lesions. IMPRESSION: 1. Mildly dilated postsurgical small bowel segment in the midline to right paramidline lower abdomen, measuring 3.6 cm caliber. This could be due to a low-grade partial small bowel obstruction along the surgical segment or possibly due to ileus. 2. Proximal to mid transverse colon wall thickening or underdistention, with colitis favored. 3. Constipation and uncomplicated sigmoid diverticulitis. 4. Trace ascites. No drainable pocket. 5. Trace pleural effusions with bibasilar atelectasis. 6. Cholelithiasis. 7. Aortic and coronary artery atherosclerosis. 8. Osteopenia and advanced degenerative change. 9. 6 mm and 5 mm right lung base nodules are stable since 09/04/2022 but new since 2021.  Nonemergent chest CT is recommended to look for other nodules. Aortic  Atherosclerosis (ICD10-I70.0). Electronically Signed   By: Almira Bar M.D.   On: 01/06/2023 21:03   DG Elbow Complete Left  Result Date: 01/06/2023 CLINICAL DATA:  Hypotension. Possible fall. Large skin tear to right forearm. Unclear etiology. Unable to right elbow. EXAM: LEFT ELBOW - COMPLETE 3+ VIEW; RIGHT ELBOW - COMPLETE 3+ VIEW COMPARISON:  Radiographs 01/06/2009 FINDINGS: Right: Severe erosive arthritic changes are redemonstrated. There is posterior displacement of the ulna in relation to the humerus. Large 3.7 cm osseous fragment anterior to the humerus compatible with age-indeterminate fracture fragment. Left: Severe erosive changes. Plate and screw fixation distal humerus. No definite acute fracture. No dislocation. IMPRESSION: 1. Posterior displacement of the right ulna in relation to the humerus. 2. Large 3.7 cm osseous fragment anterior to the right humerus compatible with age-indeterminate fracture fragment. 3. Severe erosive arthritic changes bilaterally. Electronically Signed   By: Minerva Fester M.D.   On: 01/06/2023 20:08   DG Elbow Complete Right  Result Date: 01/06/2023 CLINICAL DATA:  Hypotension. Possible fall. Large skin tear to right forearm. Unclear etiology. Unable to right elbow. EXAM: LEFT ELBOW - COMPLETE 3+ VIEW; RIGHT ELBOW - COMPLETE 3+ VIEW COMPARISON:  Radiographs 01/06/2009 FINDINGS: Right: Severe erosive arthritic changes are redemonstrated. There is posterior displacement of the ulna in relation to the humerus. Large 3.7 cm osseous fragment anterior to the humerus compatible with age-indeterminate fracture fragment. Left: Severe erosive changes. Plate and screw fixation distal humerus. No definite acute fracture. No dislocation. IMPRESSION: 1. Posterior displacement of the right ulna in relation to the humerus. 2. Large 3.7 cm osseous fragment anterior to the right humerus compatible with age-indeterminate fracture fragment. 3. Severe erosive arthritic changes  bilaterally. Electronically Signed   By: Minerva Fester M.D.   On: 01/06/2023 20:08   DG Chest Port 1 View  Result Date: 01/06/2023 CLINICAL DATA:  Questionable sepsis-evaluate for abnormality, nausea, vomiting, weakness, hypotension EXAM: PORTABLE CHEST 1 VIEW COMPARISON:  03/04/2022 FINDINGS: Stable cardiomegaly. Aortic atherosclerotic calcification. Left midlung scarring. Left basilar atelectasis. No focal consolidation, pleural effusion, or pneumothorax. No displaced rib fractures. Left chest wall pacemaker. Left TSA. IMPRESSION: No active disease. Electronically Signed   By: Minerva Fester M.D.   On: 01/06/2023 20:03   CT Head Wo Contrast  Result Date: 01/06/2023 CLINICAL DATA:  Head trauma EXAM: CT HEAD WITHOUT CONTRAST TECHNIQUE: Contiguous axial images were obtained from the base of the skull through the vertex without intravenous contrast. RADIATION DOSE REDUCTION: This exam was performed according to the departmental dose-optimization program which includes automated exposure control, adjustment of the mA and/or kV according to patient size and/or use of iterative reconstruction technique. COMPARISON:  None Available. FINDINGS: Brain: There is no mass, hemorrhage or extra-axial collection. There is generalized atrophy without lobar predilection. Hypodensity of the white matter is most commonly associated with chronic microvascular disease. Old anterior right MCA territory infarct. Vascular: Atherosclerotic calcification of the vertebral and internal carotid arteries at the skull base. No abnormal hyperdensity of the major intracranial arteries or dural venous sinuses. Skull: The visualized skull base, calvarium and extracranial soft tissues are normal. Sinuses/Orbits: No fluid levels or advanced mucosal thickening of the visualized paranasal sinuses. No mastoid or middle ear effusion. Normal orbits. IMPRESSION: 1. No acute intracranial abnormality. 2. Old anterior right MCA territory infarct  and findings of chronic microvascular disease. Electronically Signed   By: Deatra Robinson M.D.   On: 01/06/2023 19:08  Pending Labs Unresulted Labs (From admission, onward)     Start     Ordered   01/06/23 1543  Blood Culture (routine x 2)  (Septic presentation on arrival (screening labs, nursing and treatment orders for obvious sepsis))  BLOOD CULTURE X 2,   STAT      01/06/23 1543            Vitals/Pain Today's Vitals   01/06/23 1730 01/06/23 1800 01/06/23 1830 01/06/23 2208  BP: 126/64 137/60 (!) 113/50 (!) 126/56  Pulse:  67 66 75  Resp: 18 20 14 12   Temp:    98.1 F (36.7 C)  TempSrc:    Oral  SpO2: 100% 100% 100% 100%  Weight:      Height:      PainSc:        Isolation Precautions Airborne and Contact precautions  Medications Medications  sodium chloride flush (NS) 0.9 % injection 10 mL (10 mLs Intravenous Given 01/06/23 2207)  cefTRIAXone (ROCEPHIN) 1 g in sodium chloride 0.9 % 100 mL IVPB (0 g Intravenous Stopped 01/06/23 1658)  lactated ringers bolus 1,000 mL (0 mLs Intravenous Stopped 01/06/23 1630)    Mobility walks with device     Focused Assessments PT A&O. Family at beds   R Recommendations: See Admitting Provider Note  Report given to:   Additional Notes: ***

## 2023-01-06 NOTE — Progress Notes (Signed)
[facility] is following code sepsis 

## 2023-01-06 NOTE — ED Notes (Signed)
Critical I-Stat given to RN Dahlia Client

## 2023-01-06 NOTE — ED Provider Notes (Signed)
[place] EMERGENCY DEPARTMENT AT [facility] Provider Note  CSN: [id number] Arrival date & time: 01/06/23 1504  Chief Complaint(s) Hypotension and Nausea  HPI Pamela Dunn is a 89 y.o. female here today for weakness.  Patient was unable to get out of bed this morning.  She says that she is felt nauseated, has not been able to eat or drink over the last few days.  She called her son, was unable to get her out of bed, they called EMS.  EMS reported that they observed a elbow wound on the patient.  Patient is unsure of when this happened.  She has a history of rheumatoid arthritis.  Not on any immunotherapeutic's.  Last known well would be last evening.   Past Medical History Past Medical History:  Diagnosis Date   Allergy    seasonal   Anemia    Arthritis    Encounter for care of pacemaker 07/12/2020   GERD (gastroesophageal reflux disease)    Heart block AV complete (HCC) 07/11/2020   Hyperlipidemia    Hypertension    Pacemaker Abbott Assurity MRI model [id number] 07/12/2020 07/12/2020   Patient Active Problem List   Diagnosis Date Noted   Pacemaker Abbott Assurity MRI model [id number] 07/12/2020 07/12/2020   Encounter for care of pacemaker 07/12/2020   Heart block AV complete (HCC) 07/11/2020   Pressure injury of skin 08/27/2019   Hypothyroidism 08/23/2019   Chronic combined systolic and diastolic congestive heart failure (HCC) 08/23/2019   SBO (small bowel obstruction) (HCC) 08/17/2019   Multiple pulmonary nodules 06/19/2017   Metatarsalgia of left foot 07/13/2014   Hx of adenomatous colonic polyps 09/05/2013   Spinal stenosis of lumbar region 09/05/2013   Lumbar disc disease 09/05/2013   Hyperlipidemia 10/23/2009   GERD 10/23/2009   Rheumatoid arthritis (HCC) 10/23/2009   ABDOMINAL PAIN, UNSPECIFIED SITE 10/23/2009   ANEMIA, HX OF 10/23/2009   PEPTIC ULCER DISEASE, HX OF 10/23/2009   Home Medication(s) Prior to Admission medications   Medication Sig Start Date End  Date Taking? Authorizing Provider  Aspirin-Caffeine 500-32.5 MG TABS Take 1 tablet by mouth daily as needed (pain).     [provider]  azithromycin (ZITHROMAX) 250 MG tablet Take 1 tablet (250 mg total) by mouth daily. 03/04/22   Arby Barrette, MD  benzonatate (TESSALON) 100 MG capsule Take 1 capsule (100 mg total) by mouth every 8 (eight) hours. 03/04/22   Arby Barrette, MD  cholecalciferol (VITAMIN D) 1000 UNITS tablet Take 1,000 Units by mouth daily.     [provider]  Coenzyme Q10 (COQ10) 200 MG CAPS Take 200 mg by mouth daily.    [provider]  ezetimibe (ZETIA) 10 MG tablet Take 10 mg by mouth daily.     [provider]  ibuprofen (ADVIL) 400 MG tablet Take 1 tablet (400 mg total) by mouth every 8 (eight) hours as needed for up to 15 doses for mild pain or moderate pain. 09/04/22   Terald Sleeper, MD  isosorbide-hydrALAZINE (BIDIL) 20-37.5 MG tablet TAKE 1 TABLET THREE TIMES DAILY 08/21/22   Yates Decamp, MD  levothyroxine (SYNTHROID) 25 MCG tablet Take 25 mcg by mouth daily.     [provider]  metoprolol succinate (TOPROL-XL) 25 MG 24 hr tablet Take 1 tablet (25 mg total) by mouth daily. 12/15/22   Yates Decamp, MD  mupirocin nasal ointment (BACTROBAN) 2 % Place 1 application into the nose 2 (two) times daily. Use one-half of tube in each nostril  twice daily for five (5) days. After application, press sides of nose together and gently massage.    [provider]  Omega-3 Fatty Acids (OMEGA-3 FISH OIL PO) Take 1 capsule by mouth daily.    [provider]  predniSONE (DELTASONE) 5 MG tablet Take 5 mg by mouth daily. 11/03/18   [provider]  sulfaSALAzine (AZULFIDINE) 500 MG tablet Take 500 mg by mouth daily.    [provider]                                                                                                                                    Past Surgical History Past Surgical History:   Procedure Laterality Date   APPENDECTOMY     BACK SURGERY     BOWEL RESECTION N/A 08/24/2019   Procedure: SMALL BOWEL RESECTION;  Surgeon: Abigail Miyamoto, MD;  Location: [facility];  Service: General;  Laterality: N/A;   LAPAROSCOPY N/A 08/24/2019   Procedure: LAPAROSCOPY DIAGNOSTIC;  Surgeon: Abigail Miyamoto, MD;  Location: [facility];  Service: General;  Laterality: N/A;   LAPAROTOMY N/A 08/24/2019   Procedure: EXPLORATORY LAPAROTOMY;  Surgeon: Abigail Miyamoto, MD;  Location: [facility];  Service: General;  Laterality: N/A;   PACEMAKER IMPLANT N/A 07/12/2020   Procedure: PACEMAKER IMPLANT;  Surgeon: Hillis Range, MD;  Location: [facility];  Service: Cardiovascular;  Laterality: N/A;   Family History Family History  Problem Relation Age of Onset   Cancer Mother    Cancer - Colon Sister    Heart disease Sister    Brain cancer Brother    Cancer - Colon Brother    Cancer - Colon Brother    Breast cancer Maternal Aunt     Social History Social History   Tobacco Use   Smoking status: Never   Smokeless tobacco: Never  Vaping Use   Vaping status: Never Used  Substance Use Topics   Alcohol use: No   Drug use: No   Allergies Coreg [carvedilol] and Lorazepam  Review of Systems Review of Systems  Physical Exam Vital Signs  I have reviewed the triage vital signs BP (!) 101/39   Pulse 70   Temp 97.6 F (36.4 C) (Oral)   Resp (!) 22   Ht 5' (1.524 m)   Wt 56.7 kg   BMI 24.41 kg/m   Physical Exam Vitals reviewed.  HENT:     Head: Normocephalic and atraumatic.     Nose: Nose normal.  Eyes:     Pupils: Pupils are equal, round, and reactive to light.  Cardiovascular:     Rate and Rhythm: Normal rate.  Pulmonary:     Effort: Pulmonary effort is normal.     Breath sounds: No wheezing.  Abdominal:     General: Abdomen is flat. There is no distension.     Palpations: Abdomen is soft.  Musculoskeletal:        General:  Normal range of motion.     Cervical back:  Normal range of motion and neck supple.     Comments: Tear over the right elbow.  Patient Dors is pain in the left elbow.  Chronic deformities present.  Skin:    General: Skin is warm.  Neurological:     General: No focal deficit present.     Mental Status: She is alert and oriented to person, place, and time.     Sensory: No sensory deficit.     Motor: No weakness.     Comments: 5/5 bilateral lower extremity strength.  5/5 upper extremity strength.     ED Results and Treatments Labs (all labs ordered are listed, but only abnormal results are displayed) Labs Reviewed  RESP PANEL BY RT-PCR (RSV, FLU A&B, COVID)  RVPGX2  CULTURE, BLOOD (ROUTINE X 2)  CULTURE, BLOOD (ROUTINE X 2)  SARS CORONAVIRUS 2 BY RT PCR  COMPREHENSIVE METABOLIC PANEL  CBC WITH DIFFERENTIAL/PLATELET  PROTIME-INR  APTT  URINALYSIS, W/ REFLEX TO CULTURE (INFECTION SUSPECTED)  I-STAT CG4 LACTIC ACID, ED                                                                                                                          Radiology No results found.  Pertinent labs & imaging results that were available during my care of the patient were reviewed by me and considered in my medical decision making (see MDM for details).  Medications Ordered in ED Medications  sodium chloride flush (NS) 0.9 % injection 10 mL (has no administration in time range)  lactated ringers bolus 1,000 mL (has no administration in time range)  cefTRIAXone (ROCEPHIN) 1 g in sodium chloride 0.9 % 100 mL IVPB (has no administration in time range)                                                                                                                                     Procedures Procedures  (including critical care time)  Medical Decision Making / ED Course   This patient presents to the ED for concern of generalized weakness, this involves an extensive number of treatment options, and is a complaint that carries with it a high  risk of complications and morbidity.  The differential diagnosis includes dehydration, underlying infection, metabolic abnormalities, sepsis, urinary tract infection, elbow fracture, bowel obstruction.  MDM: Patient profoundly hypotensive for  EMS, did respond to IV fluids.  Do of concern for sepsis.  Have initiated a sepsis workup on the patient.  Will prophylactically provide with antibiotics.  Patient lives alone at home, unable to ambulate.  Will obtain imaging of the patient's head given the trauma on the elbow with no clear memory of how it happened.  CT imaging abdomen pelvis ordered.  Plain films of both elbows ordered.  Nursing is applying wound care to the right elbow.  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  Discussed this with the on-call orthopedic surgeon Dr. Michaelene Song, who consulted with Dr. Yehuda Budd, hand surgery.  They recommended placing the patient in a long-arm splint, and Dr. Yehuda Budd would evaluate in the morning.  Patient appears to have colitis.  I do not believe the patient is sepsis at this time.  Will admit to hospitalist.     Additional history obtained: -Additional history obtained from EMS -External records from outside source obtained and reviewed including: Chart review including previous notes, labs, imaging, consultation notes   Lab Tests: -I ordered, reviewed, and interpreted labs.   The pertinent results include:   Labs Reviewed  RESP PANEL BY RT-PCR (RSV, FLU A&B, COVID)  RVPGX2  CULTURE, BLOOD (ROUTINE X 2)  CULTURE, BLOOD (ROUTINE X 2)  SARS CORONAVIRUS 2 BY RT PCR  COMPREHENSIVE METABOLIC PANEL  CBC WITH DIFFERENTIAL/PLATELET  PROTIME-INR  APTT  URINALYSIS, W/ REFLEX TO CULTURE (INFECTION SUSPECTED)  I-STAT CG4 LACTIC ACID, ED      EKG my independent review shows normal sinus rhythm.  EKG Interpretation Date/Time:    Ventricular Rate:    PR Interval:    QRS Duration:    QT Interval:     QTC Calculation:   R Axis:      Text Interpretation:           Imaging Studies ordered: I ordered imaging studies including plain films of the elbow, chest, CT imaging of the head and neck I independently visualized and interpreted imaging. I agree with the radiologist interpretation   Medicines ordered and prescription drug management: Meds ordered this encounter  Medications   sodium chloride flush (NS) 0.9 % injection 10 mL   lactated ringers bolus 1,000 mL    Order Specific Question:   Reason 30 mL/kg dose is not being ordered    Answer:   Other (see comment below)    Order Specific Question:   Comment    Answer:   Global fluid shortage, patient can accept less volume   cefTRIAXone (ROCEPHIN) 1 g in sodium chloride 0.9 % 100 mL IVPB    Order Specific Question:   Antibiotic Indication:    Answer:   UTI    -I have reviewed the patients home medicines and have made adjustments as needed       Cardiac Monitoring: The patient was maintained on a cardiac monitor.  I personally viewed and interpreted the cardiac monitored which showed an underlying rhythm of: Sinus rhythm     Reevaluation: After the interventions noted above, I reevaluated the patient and found that they have :improved  Co morbidities that complicate the patient evaluation  Past Medical History:  Diagnosis Date   Allergy    seasonal   Anemia    Arthritis    Encounter for care of pacemaker 07/12/2020   GERD (gastroesophageal reflux disease)    Heart block AV complete (HCC) 07/11/2020   Hyperlipidemia    Hypertension  Pacemaker Abbott Assurity MRI model [id number] 07/12/2020 07/12/2020      Dispostion: Patient admitted to hospitalist.     Final Clinical Impression(s) / ED Diagnoses Final diagnoses:  None     @PCDICTATION @    Anders Simmonds T, DO 01/06/23 2323

## 2023-01-06 NOTE — ED Notes (Signed)
Given gatorade per order.

## 2023-01-06 NOTE — ED Triage Notes (Addendum)
BIBM from d/t N/V for a few days, weak, hypotensive for EMS 60/30. Improved to 80/40 with fluids NS. 20g LFA. HR 70 paced. Large skin tear to R FA. Pt denies fall but is unable to explain the tear.

## 2023-01-07 DIAGNOSIS — K5900 Constipation, unspecified: Secondary | ICD-10-CM | POA: Diagnosis present

## 2023-01-07 DIAGNOSIS — S51011A Laceration without foreign body of right elbow, initial encounter: Secondary | ICD-10-CM | POA: Diagnosis present

## 2023-01-07 DIAGNOSIS — S52001A Unspecified fracture of upper end of right ulna, initial encounter for closed fracture: Secondary | ICD-10-CM | POA: Diagnosis present

## 2023-01-07 DIAGNOSIS — I442 Atrioventricular block, complete: Secondary | ICD-10-CM | POA: Diagnosis present

## 2023-01-07 DIAGNOSIS — Z950 Presence of cardiac pacemaker: Secondary | ICD-10-CM | POA: Diagnosis not present

## 2023-01-07 DIAGNOSIS — R262 Difficulty in walking, not elsewhere classified: Secondary | ICD-10-CM | POA: Diagnosis present

## 2023-01-07 DIAGNOSIS — Z79890 Hormone replacement therapy (postmenopausal): Secondary | ICD-10-CM | POA: Diagnosis not present

## 2023-01-07 DIAGNOSIS — X58XXXA Exposure to other specified factors, initial encounter: Secondary | ICD-10-CM | POA: Diagnosis present

## 2023-01-07 DIAGNOSIS — E785 Hyperlipidemia, unspecified: Secondary | ICD-10-CM | POA: Diagnosis present

## 2023-01-07 DIAGNOSIS — E039 Hypothyroidism, unspecified: Secondary | ICD-10-CM | POA: Diagnosis present

## 2023-01-07 DIAGNOSIS — E162 Hypoglycemia, unspecified: Secondary | ICD-10-CM | POA: Diagnosis present

## 2023-01-07 DIAGNOSIS — M25521 Pain in right elbow: Secondary | ICD-10-CM | POA: Diagnosis present

## 2023-01-07 DIAGNOSIS — M064 Inflammatory polyarthropathy: Secondary | ICD-10-CM | POA: Diagnosis present

## 2023-01-07 DIAGNOSIS — E872 Acidosis, unspecified: Secondary | ICD-10-CM | POA: Diagnosis present

## 2023-01-07 DIAGNOSIS — I5042 Chronic combined systolic (congestive) and diastolic (congestive) heart failure: Secondary | ICD-10-CM | POA: Diagnosis present

## 2023-01-07 DIAGNOSIS — S42309A Unspecified fracture of shaft of humerus, unspecified arm, initial encounter for closed fracture: Secondary | ICD-10-CM | POA: Diagnosis present

## 2023-01-07 DIAGNOSIS — N1831 Chronic kidney disease, stage 3a: Secondary | ICD-10-CM | POA: Diagnosis present

## 2023-01-07 DIAGNOSIS — I428 Other cardiomyopathies: Secondary | ICD-10-CM | POA: Diagnosis present

## 2023-01-07 DIAGNOSIS — R911 Solitary pulmonary nodule: Secondary | ICD-10-CM

## 2023-01-07 DIAGNOSIS — I130 Hypertensive heart and chronic kidney disease with heart failure and stage 1 through stage 4 chronic kidney disease, or unspecified chronic kidney disease: Secondary | ICD-10-CM | POA: Diagnosis present

## 2023-01-07 DIAGNOSIS — R918 Other nonspecific abnormal finding of lung field: Secondary | ICD-10-CM | POA: Diagnosis present

## 2023-01-07 DIAGNOSIS — I071 Rheumatic tricuspid insufficiency: Secondary | ICD-10-CM | POA: Diagnosis present

## 2023-01-07 DIAGNOSIS — K529 Noninfective gastroenteritis and colitis, unspecified: Secondary | ICD-10-CM

## 2023-01-07 DIAGNOSIS — E860 Dehydration: Secondary | ICD-10-CM | POA: Diagnosis present

## 2023-01-07 DIAGNOSIS — R531 Weakness: Secondary | ICD-10-CM | POA: Diagnosis present

## 2023-01-07 DIAGNOSIS — Z9181 History of falling: Secondary | ICD-10-CM | POA: Diagnosis not present

## 2023-01-07 DIAGNOSIS — K219 Gastro-esophageal reflux disease without esophagitis: Secondary | ICD-10-CM | POA: Diagnosis present

## 2023-01-07 DIAGNOSIS — Z1152 Encounter for screening for COVID-19: Secondary | ICD-10-CM | POA: Diagnosis not present

## 2023-01-07 LAB — BASIC METABOLIC PANEL
Anion gap: 11 (ref 5–15)
BUN: 23 mg/dL (ref 8–23)
CO2: 23 mmol/L (ref 22–32)
Calcium: 8.2 mg/dL — ABNORMAL LOW (ref 8.9–10.3)
Chloride: 102 mmol/L (ref 98–111)
Creatinine, Ser: 1.12 mg/dL — ABNORMAL HIGH (ref 0.44–1.00)
GFR, Estimated: 47 mL/min — ABNORMAL LOW (ref >60)
Glucose, Bld: 74 mg/dL (ref 70–99)
Potassium: 4 mmol/L (ref 3.5–5.1)
Sodium: 136 mmol/L (ref 135–145)

## 2023-01-07 LAB — CBC
HCT: 33 % — ABNORMAL LOW (ref 36.0–46.0)
Hemoglobin: 11.2 g/dL — ABNORMAL LOW (ref 12.0–15.0)
MCH: 31.4 pg (ref 26.0–34.0)
MCHC: 33.9 g/dL (ref 30.0–36.0)
MCV: 92.4 fL (ref 80.0–100.0)
Platelets: 241 10*3/uL (ref 150–400)
RBC: 3.57 MIL/uL — ABNORMAL LOW (ref 3.87–5.11)
RDW: 14.7 % (ref 11.5–15.5)
WBC: 15.6 10*3/uL — ABNORMAL HIGH (ref 4.0–10.5)
nRBC: 0 % (ref 0.0–0.2)

## 2023-01-07 LAB — GLUCOSE, CAPILLARY
Glucose-Capillary: 72 mg/dL (ref 70–99)
Glucose-Capillary: 65 mg/dL — ABNORMAL LOW (ref 70–99)
Glucose-Capillary: 118 mg/dL — ABNORMAL HIGH (ref 70–99)
Glucose-Capillary: 119 mg/dL — ABNORMAL HIGH (ref 70–99)

## 2023-01-07 LAB — BRAIN NATRIURETIC PEPTIDE: B Natriuretic Peptide: 385.1 pg/mL — ABNORMAL HIGH (ref 0.0–100.0)

## 2023-01-07 MED ORDER — ACETAMINOPHEN 650 MG RE SUPP: 650.0000 mg | Freq: Four times a day (QID) | RECTAL | Status: DC | PRN

## 2023-01-07 MED ORDER — PREDNISONE 10 MG PO TABS
5.0000 mg | ORAL_TABLET | Freq: Every day | ORAL | Status: DC
  Administered 2023-01-07 – 2023-01-08 (×2): 5 mg via ORAL
  Filled 2023-01-07 (×2): qty 1

## 2023-01-07 MED ORDER — METRONIDAZOLE 500 MG/100ML IV SOLN
500.0000 mg | Freq: Two times a day (BID) | INTRAVENOUS | Status: DC
  Administered 2023-01-07 – 2023-01-09 (×5): 500 mg via INTRAVENOUS
  Filled 2023-01-07 (×5): qty 100

## 2023-01-07 MED ORDER — EZETIMIBE 10 MG PO TABS
10.0000 mg | ORAL_TABLET | Freq: Every day | ORAL | Status: DC
  Administered 2023-01-07 – 2023-01-12 (×6): 10 mg via ORAL
  Filled 2023-01-07 (×6): qty 1

## 2023-01-07 MED ORDER — SODIUM CHLORIDE 0.9 % IV SOLN: 1.0000 g | INTRAVENOUS | Status: DC

## 2023-01-07 MED ORDER — DEXTROSE-SODIUM CHLORIDE 5-0.9 % IV SOLN: INTRAVENOUS | Status: AC

## 2023-01-07 MED ORDER — SODIUM CHLORIDE 0.9 % IV SOLN
2.0000 g | INTRAVENOUS | Status: DC
  Administered 2023-01-07 – 2023-01-08 (×2): 2 g via INTRAVENOUS
  Filled 2023-01-07 (×2): qty 20

## 2023-01-07 MED ORDER — ACETAMINOPHEN 325 MG PO TABS
650.0000 mg | ORAL_TABLET | Freq: Four times a day (QID) | ORAL | Status: DC | PRN
  Administered 2023-01-08 – 2023-01-10 (×2): 650 mg via ORAL
  Filled 2023-01-07 (×3): qty 2

## 2023-01-07 MED ORDER — SODIUM CHLORIDE 0.9 % IV SOLN
1.0000 g | Freq: Once | INTRAVENOUS | Status: AC
  Administered 2023-01-07: 1 g via INTRAVENOUS
  Filled 2023-01-07: qty 10

## 2023-01-07 MED ORDER — LEVOTHYROXINE SODIUM 25 MCG PO TABS
25.0000 ug | ORAL_TABLET | Freq: Every day | ORAL | Status: DC
  Administered 2023-01-07 – 2023-01-12 (×6): 25 ug via ORAL
  Filled 2023-01-07 (×6): qty 1

## 2023-01-07 MED ORDER — SULFASALAZINE 500 MG PO TABS
500.0000 mg | ORAL_TABLET | Freq: Every day | ORAL | Status: DC
  Administered 2023-01-07 – 2023-01-12 (×6): 500 mg via ORAL
  Filled 2023-01-07 (×6): qty 1

## 2023-01-07 NOTE — Progress Notes (Signed)
Chronic changes in the elbow on x-ray from inflammatory arthritis.  Plan for follow-up on an outpatient basis.

## 2023-01-07 NOTE — Progress Notes (Signed)
Mobility Specialist Progress Note:    01/07/23 1138  Mobility  Activity Stood at bedside;Dangled on edge of bed  Level of Assistance Maximum assist, patient does 25-49%  Assistive Device Front wheel walker  RUE Weight Bearing WBAT  Activity Response Tolerated well  Mobility Referral Yes  $Mobility charge 1 Mobility  Mobility Specialist Start Time (ACUTE ONLY) 1120  Mobility Specialist Stop Time (ACUTE ONLY) 1138  Mobility Specialist Time Calculation (min) (ACUTE ONLY) 18 min   Pt received in bed, family member at bedside, and agreeable to mobility session. Tolerated well, BUE WBAT and BLE NWB.  Required ModA to sit EOB. Attempted to stand with RW, required MaxA, however pt unable to bear any weight on knees. Returned pt to bed, all needs met, call bell in reach, RN notified.   Feliciana Rossetti Mobility Specialist Please contact via Special educational needs teacher or  Rehab office at [phone]

## 2023-01-07 NOTE — H&P (Signed)
History and Physical    Pamela Dunn VHQ:[id number] DOB: Jul 31, 1931 DOA: 01/06/2023  PCP: Irena Reichmann, DO  Patient coming from: Home  Chief Complaint: Generalized weakness  HPI: Pamela Dunn is a 89 y.o. female with medical history significant of rheumatoid arthritis, LBBB, chronic combined CHF/nonischemic cardiomyopathy, hypertension, hyperlipidemia, CKD stage IIIa, complete heart block status post PPM, hypothyroidism, history of SBO status post small bowel resection in June 2021, GERD presented to the ED for evaluation of nausea, vomiting, generalized weakness, and hypotension.  Blood pressure 60/30 with EMS and was given 850 mL IV fluids.  Skin tear noted on right elbow.  Blood pressure remained low in the ED and was given additional 1 L IV fluids after which blood pressure improved.  Afebrile and not tachycardic.  Not tachypneic or hypoxic.  Labs showing WBC 15.5, hemoglobin 11.7 (baseline 11-12), glucose 65, creatinine 1.3 (baseline 1.0-1.2), normal LFTs, COVID/influenza/RSV PCR negative, lactate 4.2> 2.1, blood cultures collected, UA not suggestive of infection.  Chest x-ray showing no active disease.  X-ray of right elbow showing posterior displacement of the ulna in relation to the humerus.  In addition, showing a large 3.7 cm osseous fragment anterior to the right humerus compatible with age-indeterminate fracture fragment.  X-ray of left elbow negative for acute fracture or dislocation.  CT head negative for acute intracranial abnormality.  CT abdomen pelvis showing: "IMPRESSION: 1. Mildly dilated postsurgical small bowel segment in the midline to right paramidline lower abdomen, measuring 3.6 cm caliber. This could be due to a low-grade partial small bowel obstruction along the surgical segment or possibly due to ileus. 2. Proximal to mid transverse colon wall thickening or underdistention, with colitis favored. 3. Constipation and uncomplicated sigmoid diverticulitis. 4. Trace  ascites. No drainable pocket. 5. Trace pleural effusions with bibasilar atelectasis. 6. Cholelithiasis. 7. Aortic and coronary artery atherosclerosis. 8. Osteopenia and advanced degenerative change. 9. 6 mm and 5 mm right lung base nodules are stable since 09/04/2022 but new since 2021. Nonemergent chest CT is recommended to look for other nodules."  EDP discussed with on-call orthopedic surgeon Dr.Switker who consulted with Dr. Yehuda Budd, hand surgery.  They recommended placing the patient in a long-arm splint and Dr. Yehuda Budd will evaluate in the morning.  Patient was given ceftriaxone.  [facility] called to admit.  Patient states she has been dehydrated as she is vomiting for the past few days and has not been able to eat.  She also had some mild generalized abdominal discomfort which has resolved.  She was constipated but did have a bowel movement yesterday.  Denies fevers or chills.  She denies any pain in her elbow.  Reports having a fall back in August and reportedly was evaluated at that time.  Not reporting any additional falls since then.  Denies chest pain or shortness of breath.  Not able to give any additional history.  Review of Systems:  Review of Systems  All other systems reviewed and are negative.   Past Medical History:  Diagnosis Date   Allergy    seasonal   Anemia    Arthritis    Encounter for care of pacemaker 07/12/2020   GERD (gastroesophageal reflux disease)    Heart block AV complete (HCC) 07/11/2020   Hyperlipidemia    Hypertension    Pacemaker Abbott Assurity MRI model [id number] 07/12/2020 07/12/2020    Past Surgical History:  Procedure Laterality Date   APPENDECTOMY     BACK SURGERY     BOWEL RESECTION N/A 08/24/2019  Procedure: SMALL BOWEL RESECTION;  Surgeon: Abigail Miyamoto, MD;  Location: [facility];  Service: General;  Laterality: N/A;   LAPAROSCOPY N/A 08/24/2019   Procedure: LAPAROSCOPY DIAGNOSTIC;  Surgeon: Abigail Miyamoto, MD;  Location: [facility];  Service:  General;  Laterality: N/A;   LAPAROTOMY N/A 08/24/2019   Procedure: EXPLORATORY LAPAROTOMY;  Surgeon: Abigail Miyamoto, MD;  Location: [facility];  Service: General;  Laterality: N/A;   PACEMAKER IMPLANT N/A 07/12/2020   Procedure: PACEMAKER IMPLANT;  Surgeon: Hillis Range, MD;  Location: [facility];  Service: Cardiovascular;  Laterality: N/A;     reports that she has never smoked. She has never used smokeless tobacco. She reports that she does not drink alcohol and does not use drugs.  Allergies  Allergen Reactions   Coreg [Carvedilol] Diarrhea   Lorazepam Diarrhea    Family History  Problem Relation Age of Onset   Cancer Mother    Cancer - Colon Sister    Heart disease Sister    Brain cancer Brother    Cancer - Colon Brother    Cancer - Colon Brother    Breast cancer Maternal Aunt     Prior to Admission medications   Medication Sig Start Date End Date Taking? Authorizing Provider  Aspirin-Caffeine 500-32.5 MG TABS Take 1 tablet by mouth in the morning and at bedtime. [employer] [facility]   Yes [provider]  cholecalciferol (VITAMIN D) 1000 UNITS tablet Take 1,000 Units by mouth daily.    Yes [provider]  Coenzyme Q10 (COQ10) 200 MG CAPS Take 200 mg by mouth daily.   Yes [provider]  ezetimibe (ZETIA) 10 MG tablet Take 10 mg by mouth daily.    Yes [provider]  isosorbide-hydrALAZINE (BIDIL) 20-37.5 MG tablet TAKE 1 TABLET THREE TIMES DAILY Patient taking differently: Take 1 tablet by mouth in the morning and at bedtime. 08/21/22  Yes Yates Decamp, MD  levothyroxine (SYNTHROID) 25 MCG tablet Take 25 mcg by mouth daily.    Yes [provider]  metoprolol succinate (TOPROL-XL) 25 MG 24 hr tablet Take 1 tablet (25 mg total) by mouth daily. 12/15/22  Yes Yates Decamp, MD  Omega-3 Fatty Acids (OMEGA-3 FISH OIL PO) Take 1 capsule by mouth in the morning and at bedtime.   Yes [provider]  predniSONE (DELTASONE) 5 MG  tablet Take 5 mg by mouth daily. 11/03/18  Yes [provider]  sulfaSALAzine (AZULFIDINE) 500 MG tablet Take 500 mg by mouth daily.   Yes [provider]    Physical Exam: Vitals:   01/06/23 1830 01/06/23 2208 01/07/23 0000 01/07/23 0118  BP: (!) 113/50 (!) 126/56 101/66 (!) 146/55  Pulse: 66 75 73 76  Resp: 14 12 15 18   Temp:  98.1 F (36.7 C)  98.9 F (37.2 C)  TempSrc:  Oral  Oral  SpO2: 100% 100% 100% 99%  Weight:      Height:        Physical Exam Vitals reviewed.  Constitutional:      General: She is not in acute distress. HENT:     Head: Normocephalic and atraumatic.  Cardiovascular:     Rate and Rhythm: Normal rate and regular rhythm.     Pulses: Normal pulses.  Pulmonary:     Effort: Pulmonary effort is normal. No respiratory distress.     Breath sounds: Normal breath sounds. No wheezing or rales.  Abdominal:     General: Bowel sounds are normal. There is  no distension.     Palpations: Abdomen is soft.     Tenderness: There is no abdominal tenderness. There is no guarding.  Musculoskeletal:     Cervical back: Normal range of motion.     Right lower leg: No edema.     Left lower leg: No edema.  Skin:    General: Skin is warm and dry.  Neurological:     General: No focal deficit present.     Mental Status: She is alert and oriented to person, place, and time.     Labs on Admission: I have personally reviewed following labs and imaging studies  CBC: Recent Labs  Lab 01/06/23 1543  WBC 15.5*  NEUTROABS 13.4*  HGB 11.7*  HCT 36.7  MCV 94.6  PLT 281   Basic Metabolic Panel: Recent Labs  Lab 01/06/23 1543  NA 139  K 3.9  CL 102  CO2 26  GLUCOSE 65*  BUN 22  CREATININE 1.31*  CALCIUM 8.4*   GFR: Estimated Creatinine Clearance: 23 mL/min (A) (by C-G formula based on SCr of 1.31 mg/dL (H)). Liver Function Tests: Recent Labs  Lab 01/06/23 1543  AST 27  ALT 9  ALKPHOS 42  BILITOT 1.0  PROT 5.0*  ALBUMIN 2.7*   No  results for input(s): "LIPASE", "AMYLASE" in the last 168 hours. No results for input(s): "AMMONIA" in the last 168 hours. Coagulation Profile: Recent Labs  Lab 01/06/23 1543  INR 1.1   Cardiac Enzymes: No results for input(s): "CKTOTAL", "CKMB", "CKMBINDEX", "TROPONINI" in the last 168 hours. BNP (last 3 results) No results for input(s): "PROBNP" in the last 8760 hours. HbA1C: No results for input(s): "HGBA1C" in the last 72 hours. CBG: No results for input(s): "GLUCAP" in the last 168 hours. Lipid Profile: No results for input(s): "CHOL", "HDL", "LDLCALC", "TRIG", "CHOLHDL", "LDLDIRECT" in the last 72 hours. Thyroid Function Tests: No results for input(s): "TSH", "T4TOTAL", "FREET4", "T3FREE", "THYROIDAB" in the last 72 hours. Anemia Panel: No results for input(s): "VITAMINB12", "FOLATE", "FERRITIN", "TIBC", "IRON", "RETICCTPCT" in the last 72 hours. Urine analysis:    Component Value Date/Time   COLORURINE YELLOW 01/06/2023 2209   APPEARANCEUR CLEAR 01/06/2023 2209   LABSPEC 1.013 01/06/2023 2209   PHURINE 6.0 01/06/2023 2209   GLUCOSEU NEGATIVE 01/06/2023 2209   HGBUR NEGATIVE 01/06/2023 2209   BILIRUBINUR NEGATIVE 01/06/2023 2209   KETONESUR NEGATIVE 01/06/2023 2209   PROTEINUR NEGATIVE 01/06/2023 2209   NITRITE NEGATIVE 01/06/2023 2209   LEUKOCYTESUR NEGATIVE 01/06/2023 2209    Radiological Exams on Admission: CT ABDOMEN PELVIS WO CONTRAST  Result Date: 01/06/2023 CLINICAL DATA:  Hypotension, nausea, and suspected small-bowel obstruction. Previous history of small-bowel obstruction. Questionable sepsis. EXAM: CT ABDOMEN AND PELVIS WITHOUT CONTRAST TECHNIQUE: Multidetector CT imaging of the abdomen and pelvis was performed following the standard protocol without IV contrast. RADIATION DOSE REDUCTION: This exam was performed according to the departmental dose-optimization program which includes automated exposure control, adjustment of the mA and/or kV according to  patient size and/or use of iterative reconstruction technique. COMPARISON:  CTs with IV contrast 09/04/2022 and 08/23/2019. FINDINGS: Lower chest: There are trace pleural effusions. Subpleural linear atelectasis in the lower lobes. Mild chronic elevation right hemidiaphragm. New from 2021 but stable since 09/04/2022, there is a 6 mm right middle lobe noncalcified nodule on 4:2, a 5 mm stable noncalcified right lower lobe anterior basal segment nodule on 4:9, and adjacent 2 mm subpleural right middle lobe nodules on 4:7. Four months of stability is confirmed but  there could be other nodules above the plane of current and prior imaging. Chest CT is recommended to assess for additional nodules. No focal consolidation is seen. There is mild diffuse bronchial thickening. Mild cardiomegaly is unchanged. No pericardial effusion. There is metal artifact from pacemaker wiring in the right heart. Coronary artery calcifications. Small hiatal hernia. Hepatobiliary: No liver masses seen without contrast. There are occasional calcified granulomas. There are they few small stones layering in the proximal gallbladder but no wall thickening no biliary dilatation. Pancreas: Partially atrophic. Otherwise unremarkable without contrast. Spleen: No abnormality is seen without contrast. Adrenals/Urinary Tract: There is no adrenal mass. Bilateral renal cortical thinning is again noted. A 1.7 cm Bosniak 1 cyst is again seen in the midpole left kidney anteriorly, Hounsfield density is 2. No follow-up imaging is recommended. No other contour deforming deformity abnormality is seen of the kidneys. There is no urinary stone or obstruction. The bladder is unremarkable for the degree of distention. Stomach/Bowel: Interval increased thickening at the GE junction warranting endoscopic follow-up. Rest of the gastric wall is contracted. There is a mildly dilated postsurgical small bowel segment in the midline to right paramidline lower abdomen,  measuring 3.6 cm caliber. Remainder of the small bowel is normal caliber. This could be due to a low-grade partial small bowel obstruction along the surgical segment or possibly due to ileus. An appendix is not seen in this patient. There is either wall thickening or underdistention in the proximal transverse colon to the midline, with colitis favored as there are hazy reactive mesenteric inflammatory changes in the area. There is mild fecal stasis in the distal transverse, descending and rectosigmoid colon, without evidence of colitis. There is uncomplicated sigmoid diverticulitis. A moderate-sized stool ball distends the rectum but there is no wall thickening. Vascular/Lymphatic: Extensive aortoiliac and visceral abdominal arterial calcific plaques. No AAA. No adenopathy is seen. Reproductive: Uterus and bilateral adnexa are unremarkable. Other: Small umbilical fat hernia. No incarcerated hernia. Trace ascites in the posterior pelvis and subhepatic space. No drainable pocket is seen. There is no free air, free hemorrhage or localizing collection. Musculoskeletal: Osteopenia and advanced degenerative change lower thoracic and lumbar spine. Bone-on-bone superior joint space loss of both hips with acetabular osteophytes. Acquired spinal stenosis again noted L1-2, L2-3, and L3-4 with multilevel lumbar foraminal stenosis. No suspicious regional osseous lesions. IMPRESSION: 1. Mildly dilated postsurgical small bowel segment in the midline to right paramidline lower abdomen, measuring 3.6 cm caliber. This could be due to a low-grade partial small bowel obstruction along the surgical segment or possibly due to ileus. 2. Proximal to mid transverse colon wall thickening or underdistention, with colitis favored. 3. Constipation and uncomplicated sigmoid diverticulitis. 4. Trace ascites. No drainable pocket. 5. Trace pleural effusions with bibasilar atelectasis. 6. Cholelithiasis. 7. Aortic and coronary artery  atherosclerosis. 8. Osteopenia and advanced degenerative change. 9. 6 mm and 5 mm right lung base nodules are stable since 09/04/2022 but new since 2021. Nonemergent chest CT is recommended to look for other nodules. Aortic Atherosclerosis (ICD10-I70.0). Electronically Signed   By: Almira Bar M.D.   On: 01/06/2023 21:03   DG Elbow Complete Left  Result Date: 01/06/2023 CLINICAL DATA:  Hypotension. Possible fall. Large skin tear to right forearm. Unclear etiology. Unable to right elbow. EXAM: LEFT ELBOW - COMPLETE 3+ VIEW; RIGHT ELBOW - COMPLETE 3+ VIEW COMPARISON:  Radiographs 01/06/2009 FINDINGS: Right: Severe erosive arthritic changes are redemonstrated. There is posterior displacement of the ulna in relation to the humerus. Large 3.7  cm osseous fragment anterior to the humerus compatible with age-indeterminate fracture fragment. Left: Severe erosive changes. Plate and screw fixation distal humerus. No definite acute fracture. No dislocation. IMPRESSION: 1. Posterior displacement of the right ulna in relation to the humerus. 2. Large 3.7 cm osseous fragment anterior to the right humerus compatible with age-indeterminate fracture fragment. 3. Severe erosive arthritic changes bilaterally. Electronically Signed   By: Minerva Fester M.D.   On: 01/06/2023 20:08   DG Elbow Complete Right  Result Date: 01/06/2023 CLINICAL DATA:  Hypotension. Possible fall. Large skin tear to right forearm. Unclear etiology. Unable to right elbow. EXAM: LEFT ELBOW - COMPLETE 3+ VIEW; RIGHT ELBOW - COMPLETE 3+ VIEW COMPARISON:  Radiographs 01/06/2009 FINDINGS: Right: Severe erosive arthritic changes are redemonstrated. There is posterior displacement of the ulna in relation to the humerus. Large 3.7 cm osseous fragment anterior to the humerus compatible with age-indeterminate fracture fragment. Left: Severe erosive changes. Plate and screw fixation distal humerus. No definite acute fracture. No dislocation. IMPRESSION: 1.  Posterior displacement of the right ulna in relation to the humerus. 2. Large 3.7 cm osseous fragment anterior to the right humerus compatible with age-indeterminate fracture fragment. 3. Severe erosive arthritic changes bilaterally. Electronically Signed   By: Minerva Fester M.D.   On: 01/06/2023 20:08   DG Chest Port 1 View  Result Date: 01/06/2023 CLINICAL DATA:  Questionable sepsis-evaluate for abnormality, nausea, vomiting, weakness, hypotension EXAM: PORTABLE CHEST 1 VIEW COMPARISON:  03/04/2022 FINDINGS: Stable cardiomegaly. Aortic atherosclerotic calcification. Left midlung scarring. Left basilar atelectasis. No focal consolidation, pleural effusion, or pneumothorax. No displaced rib fractures. Left chest wall pacemaker. Left TSA. IMPRESSION: No active disease. Electronically Signed   By: Minerva Fester M.D.   On: 01/06/2023 20:03   CT Head Wo Contrast  Result Date: 01/06/2023 CLINICAL DATA:  Head trauma EXAM: CT HEAD WITHOUT CONTRAST TECHNIQUE: Contiguous axial images were obtained from the base of the skull through the vertex without intravenous contrast. RADIATION DOSE REDUCTION: This exam was performed according to the departmental dose-optimization program which includes automated exposure control, adjustment of the mA and/or kV according to patient size and/or use of iterative reconstruction technique. COMPARISON:  None Available. FINDINGS: Brain: There is no mass, hemorrhage or extra-axial collection. There is generalized atrophy without lobar predilection. Hypodensity of the white matter is most commonly associated with chronic microvascular disease. Old anterior right MCA territory infarct. Vascular: Atherosclerotic calcification of the vertebral and internal carotid arteries at the skull base. No abnormal hyperdensity of the major intracranial arteries or dural venous sinuses. Skull: The visualized skull base, calvarium and extracranial soft tissues are normal. Sinuses/Orbits: No fluid  levels or advanced mucosal thickening of the visualized paranasal sinuses. No mastoid or middle ear effusion. Normal orbits. IMPRESSION: 1. No acute intracranial abnormality. 2. Old anterior right MCA territory infarct and findings of chronic microvascular disease. Electronically Signed   By: Deatra Robinson M.D.   On: 01/06/2023 19:08    EKG: Independently reviewed.  Interpretation limited secondary to paced rhythm.  Left bundle branch block is not new.  QTc 552.  Assessment and Plan  Suspected colitis/diverticulitis CT showing low-grade partial SBO versus ileus versus colitis/diverticulitis.  Patient does report constipation but she did have a bowel movement yesterday.  SBO less likely as abdominal exam benign.  Bowel sounds present and no abdominal distention or tenderness.  Her presentation is more consistent with colitis given leukocytosis on labs.  Suspect hypotension and lactic acidosis are due to severe dehydration, resolved after  IV fluids.  No fever or tachycardia.  Patient is not actively vomiting at this time and resting comfortably.  Continue antibiotics for colitis including ceftriaxone and metronidazole.  Continue gentle IV fluid hydration.  Trend WBC count and follow-up blood cultures.  Nonunion of ulna X-ray of right elbow showing posterior displacement of the ulna in relation to the humerus.  In addition, showing a large 3.7 cm osseous fragment anterior to the right humerus compatible with age-indeterminate fracture fragment.  Orthopedics and hand surgery consulted by EDP.  Long-arm splint placed and hand surgery will evaluate in the morning.  Patient reports history of a fall back in August but denies any elbow pain.  Mild hypoglycemia In the setting of vomiting/poor p.o. intake for the past few days.  She has been started on gentle IV fluids with dextrose as she will be n.p.o. until seen by orthopedics in the morning.  Continue CBG checks every 4 hours.  Incidental pulmonary  nodules CT abdomen pelvis showing 6 mm and 5 mm right lung base nodules which appear stable since 09/04/2022 but new since 2021.  Patient will need nonemergent outpatient chest CT to evaluate for other nodules.  Chronic CHF/nonischemic cardiomyopathy Last echo done in April 2023 showing EF 57%, grade 1 diastolic dysfunction, moderate tricuspid regurgitation.  Does not appear volume overloaded on exam.  CT showing trace pleural effusions.  Check BNP.  Hyperlipidemia Continue Zetia  Hypothyroidism Continue Synthroid  Rheumatoid arthritis Continue prednisone and sulfasalazine  Hypertension Hold antihypertensives at this time and monitor blood pressure closely.  DVT prophylaxis: SCDs Code Status: Full Code (discussed with the patient) Family Communication: No family available at this time. Level of care: Telemetry bed Admission status: It is my clinical opinion that referral for OBSERVATION is reasonable and necessary in this patient based on the above information provided. The aforementioned taken together are felt to place the patient at high risk for further clinical deterioration. However, it is anticipated that the patient may be medically stable for discharge from the hospital within 24 to 48 hours.  John Giovanni MD [facility]  If 7PM-7AM, please contact night-coverage [email or web address]  01/07/2023, 3:07 AM

## 2023-01-07 NOTE — Progress Notes (Signed)
PROGRESS NOTE    Pamela Dunn  SAY:[id number] DOB: [birth year] DOA: 01/06/2023 PCP: Irena Reichmann, DO   Brief Narrative:  Pamela Dunn is a 89 y.o. female with medical history significant of rheumatoid arthritis, LBBB, chronic combined CHF/nonischemic cardiomyopathy, hypertension, hyperlipidemia, CKD stage IIIa, complete heart block status post PPM, hypothyroidism, history of SBO status post small bowel resection in June 2021, GERD presented to the ED for evaluation of nausea, vomiting, generalized weakness, and hypotension.  Initial imaging at intake of the right elbow showed posterior displacement of the ulna with large 3.7 cm osseous fragment anterior to the humerus concerning for age-indeterminate fracture.  Hospitalist called for admission, Ortho called in consult.  Assessment & Plan:   Principal Problem:   Colitis Active Problems:   Rheumatoid arthritis (HCC)   Chronic diastolic CHF (congestive heart failure) (HCC)   Hypoglycemia   Incidental pulmonary nodule  Suspected colitis/diverticulitis Rule out partial small bowel obstruction -CT concerning for partial small bowel obstruction, versus inflammation consistent with colitis/diverticulitis -Ceftriaxone/Flagyl ongoing, low threshold to discontinue given patient's marked improvement -Patient's GI symptoms appear to be resolving at this point -she has been n.p.o. for potential surgery which is likely contributing to her lack of symptoms -Pending surgical plan will advance patient's diet and follow-up for symptoms -No indication at this time for further imaging consult or workup pending patient's tolerance of p.o. given improvement in symptoms  Posterior displacement of right ulna with large 3.7 cm osseous fragment noted on imaging, POA -Orthopedic surgery consulted(hand) -follow for further recommendations -Pain currently well-controlled, wrapped and secured at this time with splint -Prior history of fall in August, unclear  timing of elbow fracture given lack of symptoms   Hypoglycemia -In the setting of poor p.o. intake/n.p.o. status, continue IV fluids -Advance diet as tolerated as above once surgical status has been confirmed   Incidental pulmonary nodules CT abdomen pelvis showing 6 mm and 5 mm right lung base nodules which appear stable since 09/04/2022 but new since 2021.  Follow-up outpatient with PCP for nonemergent outpatient chest CT to evaluate for other nodules.   Chronic CHF/nonischemic cardiomyopathy Echo done in April 2023 showing EF 57%, grade 1 diastolic dysfunction, moderate tricuspid regurgitation. No indication for repeat echo at this time, patient euvolemic and asymptomatic   Hyperlipidemia Continue Zetia   Hypothyroidism Continue Synthroid   Rheumatoid arthritis Continue prednisone and sulfasalazine   Hypertension Hold antihypertensives at this time and monitor blood pressure closely.  DVT prophylaxis: SCDs Start: 01/07/23 2355   Code Status:   Code Status: Full Code Family Communication: At bedside  Status is: Inpatient  Dispo: The patient is from: Home              Anticipated d/c is to: Home              Anticipated d/c date is: 24 to 48 hours pending surgical evaluation and p.o. tolerance              Patient currently not medically stable for discharge until cleared by orthopedics   Consultants:  Orthopedic surgery  Procedures:  None  Antimicrobials:  Ceftriaxone/Flagyl  Subjective: No acute issues or events overnight, patient's only complaint today is that she is hungry but understands she cannot eat until cleared by orthopedic surgery; otherwise denies nausea vomiting diarrhea constipation headache fevers chills chest pain shortness of breath  Objective: Vitals:   01/06/23 2208 01/07/23 0000 01/07/23 0118 01/07/23 0420  BP: (!) 126/56 101/66 (!) 146/55 (!) 116/41  Pulse: 75 73 76 73  Resp: 12 15 18 18   Temp: 98.1 F (36.7 C)  98.9 F (37.2 C) 98.8 F  (37.1 C)  TempSrc: Oral  Oral   SpO2: 100% 100% 99% 100%  Weight:      Height:        Intake/Output Summary (Last 24 hours) at 01/07/2023 0728 Last data filed at 01/06/2023 1755 Gross per 24 hour  Intake 1580 ml  Output --  Net 1580 ml   Filed Weights   01/06/23 1516  Weight: 56.7 kg    Examination:  General exam: Appears calm and comfortable  Respiratory system: Clear to auscultation. Respiratory effort normal. Cardiovascular system: S1 & S2 heard, RRR. No JVD, murmurs, rubs, gallops or clicks. No pedal edema. Gastrointestinal system: Abdomen is nondistended, soft and nontender. No organomegaly or masses felt. Normal bowel sounds heard. Central nervous system: Alert and oriented. No focal neurological deficits. Extremities: Symmetric 5 x 5 power. Skin: No rashes, lesions or ulcers Psychiatry: Judgement and insight appear normal. Mood & affect appropriate.     Data Reviewed: I have personally reviewed following labs and imaging studies  CBC: Recent Labs  Lab 01/06/23 1543  WBC 15.5*  NEUTROABS 13.4*  HGB 11.7*  HCT 36.7  MCV 94.6  PLT 281   Basic Metabolic Panel: Recent Labs  Lab 01/06/23 1543  NA 139  K 3.9  CL 102  CO2 26  GLUCOSE 65*  BUN 22  CREATININE 1.31*  CALCIUM 8.4*   GFR: Estimated Creatinine Clearance: 23 mL/min (A) (by C-G formula based on SCr of 1.31 mg/dL (H)). Liver Function Tests: Recent Labs  Lab 01/06/23 1543  AST 27  ALT 9  ALKPHOS 42  BILITOT 1.0  PROT 5.0*  ALBUMIN 2.7*   No results for input(s): "LIPASE", "AMYLASE" in the last 168 hours. No results for input(s): "AMMONIA" in the last 168 hours. Coagulation Profile: Recent Labs  Lab 01/06/23 1543  INR 1.1   Cardiac Enzymes: No results for input(s): "CKTOTAL", "CKMB", "CKMBINDEX", "TROPONINI" in the last 168 hours. BNP (last 3 results) No results for input(s): "PROBNP" in the last 8760 hours. HbA1C: No results for input(s): "HGBA1C" in the last 72  hours. CBG: Recent Labs  Lab 01/07/23 0411  GLUCAP 72   Lipid Profile: No results for input(s): "CHOL", "HDL", "LDLCALC", "TRIG", "CHOLHDL", "LDLDIRECT" in the last 72 hours. Thyroid Function Tests: No results for input(s): "TSH", "T4TOTAL", "FREET4", "T3FREE", "THYROIDAB" in the last 72 hours. Anemia Panel: No results for input(s): "VITAMINB12", "FOLATE", "FERRITIN", "TIBC", "IRON", "RETICCTPCT" in the last 72 hours. Sepsis Labs: Recent Labs  Lab 01/06/23 1629 01/06/23 1742  LATICACIDVEN 4.2* 2.1*    Recent Results (from the past 240 hour(s))  Resp panel by RT-PCR (RSV, Flu A&B, Covid)     Status: None   Collection Time: 01/06/23  3:43 PM   Specimen: Nasal Swab  Result Value Ref Range Status   SARS Coronavirus 2 by RT PCR NEGATIVE NEGATIVE Final   Influenza A by PCR NEGATIVE NEGATIVE Final   Influenza B by PCR NEGATIVE NEGATIVE Final    Comment: (NOTE) The Xpert Xpress SARS-CoV-2/FLU/RSV plus assay is intended as an aid in the diagnosis of influenza from Nasopharyngeal swab specimens and should not be used as a sole basis for treatment. Nasal washings and aspirates are unacceptable for Xpert Xpress SARS-CoV-2/FLU/RSV testing.  Fact Sheet for Patients: BloggerCourse.com  Fact Sheet for Healthcare Providers: SeriousBroker.it  This test is not yet approved or cleared by  the [employer] and has been authorized for detection and/or diagnosis of SARS-CoV-2 by FDA under an Emergency Use Authorization (EUA). This EUA will remain in effect (meaning this test can be used) for the duration of the COVID-19 declaration under Section 564(b)(1) of the Act, 21 U.S.C. section 360bbb-3(b)(1), unless the authorization is terminated or revoked.     Resp Syncytial Virus by PCR NEGATIVE NEGATIVE Final    Comment: (NOTE) Fact Sheet for Patients: BloggerCourse.com  Fact Sheet for Healthcare  Providers: SeriousBroker.it  This test is not yet approved or cleared by the [place] FDA and has been authorized for detection and/or diagnosis of SARS-CoV-2 by FDA under an Emergency Use Authorization (EUA). This EUA will remain in effect (meaning this test can be used) for the duration of the COVID-19 declaration under Section 564(b)(1) of the Act, 21 U.S.C. section 360bbb-3(b)(1), unless the authorization is terminated or revoked.  Performed at [facility], [address], [place], [place] [address]   Blood Culture (routine x 2)     Status: None (Preliminary result)   Collection Time: 01/06/23  3:43 PM   Specimen: BLOOD LEFT FOREARM  Result Value Ref Range Status   Specimen Description BLOOD LEFT FOREARM  Final   Special Requests   Final    BOTTLES DRAWN AEROBIC AND ANAEROBIC Blood Culture adequate volume   Culture   Final    NO GROWTH < 24 HOURS Performed at [facility], [address] [place], [place] [address]    Report Status PENDING  Incomplete  Blood Culture (routine x 2)     Status: None (Preliminary result)   Collection Time: 01/06/23  3:55 PM   Specimen: BLOOD  Result Value Ref Range Status   Specimen Description BLOOD RIGHT ANTECUBITAL  Final   Special Requests   Final    BOTTLES DRAWN AEROBIC AND ANAEROBIC Blood Culture adequate volume   Culture   Final    NO GROWTH < 24 HOURS Performed at [facility], [address] [place], [place] [address]    Report Status PENDING  Incomplete         Radiology Studies: CT ABDOMEN PELVIS WO CONTRAST  Result Date: 01/06/2023 CLINICAL DATA:  Hypotension, nausea, and suspected small-bowel obstruction. Previous history of small-bowel obstruction. Questionable sepsis. EXAM: CT ABDOMEN AND PELVIS WITHOUT CONTRAST TECHNIQUE: Multidetector CT imaging of the abdomen and pelvis was performed following the standard protocol without IV contrast. RADIATION DOSE REDUCTION: This  exam was performed according to the departmental dose-optimization program which includes automated exposure control, adjustment of the mA and/or kV according to patient size and/or use of iterative reconstruction technique. COMPARISON:  CTs with IV contrast 09/04/2022 and 08/23/2019. FINDINGS: Lower chest: There are trace pleural effusions. Subpleural linear atelectasis in the lower lobes. Mild chronic elevation right hemidiaphragm. New from 2021 but stable since 09/04/2022, there is a 6 mm right middle lobe noncalcified nodule on 4:2, a 5 mm stable noncalcified right lower lobe anterior basal segment nodule on 4:9, and adjacent 2 mm subpleural right middle lobe nodules on 4:7. Four months of stability is confirmed but there could be other nodules above the plane of current and prior imaging. Chest CT is recommended to assess for additional nodules. No focal consolidation is seen. There is mild diffuse bronchial thickening. Mild cardiomegaly is unchanged. No pericardial effusion. There is metal artifact from pacemaker wiring in the right heart. Coronary artery calcifications. Small hiatal hernia. Hepatobiliary: No liver masses seen  without contrast. There are occasional calcified granulomas. There are they few small stones layering in the proximal gallbladder but no wall thickening no biliary dilatation. Pancreas: Partially atrophic. Otherwise unremarkable without contrast. Spleen: No abnormality is seen without contrast. Adrenals/Urinary Tract: There is no adrenal mass. Bilateral renal cortical thinning is again noted. A 1.7 cm Bosniak 1 cyst is again seen in the midpole left kidney anteriorly, Hounsfield density is 2. No follow-up imaging is recommended. No other contour deforming deformity abnormality is seen of the kidneys. There is no urinary stone or obstruction. The bladder is unremarkable for the degree of distention. Stomach/Bowel: Interval increased thickening at the GE junction warranting endoscopic  follow-up. Rest of the gastric wall is contracted. There is a mildly dilated postsurgical small bowel segment in the midline to right paramidline lower abdomen, measuring 3.6 cm caliber. Remainder of the small bowel is normal caliber. This could be due to a low-grade partial small bowel obstruction along the surgical segment or possibly due to ileus. An appendix is not seen in this patient. There is either wall thickening or underdistention in the proximal transverse colon to the midline, with colitis favored as there are hazy reactive mesenteric inflammatory changes in the area. There is mild fecal stasis in the distal transverse, descending and rectosigmoid colon, without evidence of colitis. There is uncomplicated sigmoid diverticulitis. A moderate-sized stool ball distends the rectum but there is no wall thickening. Vascular/Lymphatic: Extensive aortoiliac and visceral abdominal arterial calcific plaques. No AAA. No adenopathy is seen. Reproductive: Uterus and bilateral adnexa are unremarkable. Other: Small umbilical fat hernia. No incarcerated hernia. Trace ascites in the posterior pelvis and subhepatic space. No drainable pocket is seen. There is no free air, free hemorrhage or localizing collection. Musculoskeletal: Osteopenia and advanced degenerative change lower thoracic and lumbar spine. Bone-on-bone superior joint space loss of both hips with acetabular osteophytes. Acquired spinal stenosis again noted L1-2, L2-3, and L3-4 with multilevel lumbar foraminal stenosis. No suspicious regional osseous lesions. IMPRESSION: 1. Mildly dilated postsurgical small bowel segment in the midline to right paramidline lower abdomen, measuring 3.6 cm caliber. This could be due to a low-grade partial small bowel obstruction along the surgical segment or possibly due to ileus. 2. Proximal to mid transverse colon wall thickening or underdistention, with colitis favored. 3. Constipation and uncomplicated sigmoid  diverticulitis. 4. Trace ascites. No drainable pocket. 5. Trace pleural effusions with bibasilar atelectasis. 6. Cholelithiasis. 7. Aortic and coronary artery atherosclerosis. 8. Osteopenia and advanced degenerative change. 9. 6 mm and 5 mm right lung base nodules are stable since 09/04/2022 but new since 2021. Nonemergent chest CT is recommended to look for other nodules. Aortic Atherosclerosis (ICD10-I70.0). Electronically Signed   By: Almira Bar M.D.   On: 01/06/2023 21:03   DG Elbow Complete Left  Result Date: 01/06/2023 CLINICAL DATA:  Hypotension. Possible fall. Large skin tear to right forearm. Unclear etiology. Unable to right elbow. EXAM: LEFT ELBOW - COMPLETE 3+ VIEW; RIGHT ELBOW - COMPLETE 3+ VIEW COMPARISON:  Radiographs 01/06/2009 FINDINGS: Right: Severe erosive arthritic changes are redemonstrated. There is posterior displacement of the ulna in relation to the humerus. Large 3.7 cm osseous fragment anterior to the humerus compatible with age-indeterminate fracture fragment. Left: Severe erosive changes. Plate and screw fixation distal humerus. No definite acute fracture. No dislocation. IMPRESSION: 1. Posterior displacement of the right ulna in relation to the humerus. 2. Large 3.7 cm osseous fragment anterior to the right humerus compatible with age-indeterminate fracture fragment. 3. Severe erosive arthritic changes  bilaterally. Electronically Signed   By: Minerva Fester M.D.   On: 01/06/2023 20:08   DG Elbow Complete Right  Result Date: 01/06/2023 CLINICAL DATA:  Hypotension. Possible fall. Large skin tear to right forearm. Unclear etiology. Unable to right elbow. EXAM: LEFT ELBOW - COMPLETE 3+ VIEW; RIGHT ELBOW - COMPLETE 3+ VIEW COMPARISON:  Radiographs 01/06/2009 FINDINGS: Right: Severe erosive arthritic changes are redemonstrated. There is posterior displacement of the ulna in relation to the humerus. Large 3.7 cm osseous fragment anterior to the humerus compatible with  age-indeterminate fracture fragment. Left: Severe erosive changes. Plate and screw fixation distal humerus. No definite acute fracture. No dislocation. IMPRESSION: 1. Posterior displacement of the right ulna in relation to the humerus. 2. Large 3.7 cm osseous fragment anterior to the right humerus compatible with age-indeterminate fracture fragment. 3. Severe erosive arthritic changes bilaterally. Electronically Signed   By: Minerva Fester M.D.   On: 01/06/2023 20:08   DG Chest Port 1 View  Result Date: 01/06/2023 CLINICAL DATA:  Questionable sepsis-evaluate for abnormality, nausea, vomiting, weakness, hypotension EXAM: PORTABLE CHEST 1 VIEW COMPARISON:  03/04/2022 FINDINGS: Stable cardiomegaly. Aortic atherosclerotic calcification. Left midlung scarring. Left basilar atelectasis. No focal consolidation, pleural effusion, or pneumothorax. No displaced rib fractures. Left chest wall pacemaker. Left TSA. IMPRESSION: No active disease. Electronically Signed   By: Minerva Fester M.D.   On: 01/06/2023 20:03   CT Head Wo Contrast  Result Date: 01/06/2023 CLINICAL DATA:  Head trauma EXAM: CT HEAD WITHOUT CONTRAST TECHNIQUE: Contiguous axial images were obtained from the base of the skull through the vertex without intravenous contrast. RADIATION DOSE REDUCTION: This exam was performed according to the departmental dose-optimization program which includes automated exposure control, adjustment of the mA and/or kV according to patient size and/or use of iterative reconstruction technique. COMPARISON:  None Available. FINDINGS: Brain: There is no mass, hemorrhage or extra-axial collection. There is generalized atrophy without lobar predilection. Hypodensity of the white matter is most commonly associated with chronic microvascular disease. Old anterior right MCA territory infarct. Vascular: Atherosclerotic calcification of the vertebral and internal carotid arteries at the skull base. No abnormal hyperdensity of  the major intracranial arteries or dural venous sinuses. Skull: The visualized skull base, calvarium and extracranial soft tissues are normal. Sinuses/Orbits: No fluid levels or advanced mucosal thickening of the visualized paranasal sinuses. No mastoid or middle ear effusion. Normal orbits. IMPRESSION: 1. No acute intracranial abnormality. 2. Old anterior right MCA territory infarct and findings of chronic microvascular disease. Electronically Signed   By: Deatra Robinson M.D.   On: 01/06/2023 19:08        Scheduled Meds:  ezetimibe  10 mg Oral Daily   levothyroxine  25 mcg Oral Daily   predniSONE  5 mg Oral Daily   sodium chloride flush  10 mL Intravenous Q12H   sulfaSALAzine  500 mg Oral Daily   Continuous Infusions:  cefTRIAXone (ROCEPHIN)  IV     dextrose 5 % and 0.9 % NaCl 50 mL/hr at 01/07/23 0511   metronidazole 500 mg (01/07/23 0610)     LOS: 0 days   Time spent:  Azucena Fallen, DO [facility]  If 7PM-7AM, please contact night-coverage [email or web address]  01/07/2023, 7:28 AM

## 2023-01-07 NOTE — Plan of Care (Signed)

## 2023-01-07 NOTE — Plan of Care (Signed)

## 2023-01-07 NOTE — Consult Note (Signed)
Reason for Consult:Right elbow fx Referring Physician: Omelia Blackwater Time called: 0730 Time at bedside: 0856   Pamela Dunn is an 89 y.o. female.  HPI: Karn was admitted yesterday with suspected colitis. She also had a c/o right elbow pain and had a skin tear there. She denies any recent falls or trauma however. She says the elbow began hurting night before last. She had a proper fall in August and then had an episode where she slid out of her chair about 2 weeks ago but did not have any elbow pain associated with those. X-rays showed a possible acute elbow fx and orthopedic surgery was consulted.  Past Medical History:  Diagnosis Date   Allergy    seasonal   Anemia    Arthritis    Encounter for care of pacemaker 07/12/2020   GERD (gastroesophageal reflux disease)    Heart block AV complete (HCC) 07/11/2020   Hyperlipidemia    Hypertension    Pacemaker Abbott Assurity MRI model [id number] 07/12/2020 07/12/2020    Past Surgical History:  Procedure Laterality Date   APPENDECTOMY     BACK SURGERY     BOWEL RESECTION N/A 08/24/2019   Procedure: SMALL BOWEL RESECTION;  Surgeon: Abigail Miyamoto, MD;  Location: [facility];  Service: General;  Laterality: N/A;   LAPAROSCOPY N/A 08/24/2019   Procedure: LAPAROSCOPY DIAGNOSTIC;  Surgeon: Abigail Miyamoto, MD;  Location: [facility];  Service: General;  Laterality: N/A;   LAPAROTOMY N/A 08/24/2019   Procedure: EXPLORATORY LAPAROTOMY;  Surgeon: Abigail Miyamoto, MD;  Location: [facility];  Service: General;  Laterality: N/A;   PACEMAKER IMPLANT N/A 07/12/2020   Procedure: PACEMAKER IMPLANT;  Surgeon: Hillis Range, MD;  Location: [facility];  Service: Cardiovascular;  Laterality: N/A;    Family History  Problem Relation Age of Onset   Cancer Mother    Cancer - Colon Sister    Heart disease Sister    Brain cancer Brother    Cancer - Colon Brother    Cancer - Colon Brother    Breast cancer Maternal Aunt     Social History:  reports that she  has never smoked. She has never used smokeless tobacco. She reports that she does not drink alcohol and does not use drugs.  Allergies:  Allergies  Allergen Reactions   Coreg [Carvedilol] Diarrhea   Lorazepam Diarrhea    Medications: I have reviewed the patient's current medications.  Results for orders placed or performed during the hospital encounter of 01/06/23 (from the past 48 hour(s))  Resp panel by RT-PCR (RSV, Flu A&B, Covid)     Status: None   Collection Time: 01/06/23  3:43 PM   Specimen: Nasal Swab  Result Value Ref Range   SARS Coronavirus 2 by RT PCR NEGATIVE NEGATIVE   Influenza A by PCR NEGATIVE NEGATIVE   Influenza B by PCR NEGATIVE NEGATIVE    Comment: (NOTE) The Xpert Xpress SARS-CoV-2/FLU/RSV plus assay is intended as an aid in the diagnosis of influenza from Nasopharyngeal swab specimens and should not be used as a sole basis for treatment. Nasal washings and aspirates are unacceptable for Xpert Xpress SARS-CoV-2/FLU/RSV testing.  Fact Sheet for Patients: BloggerCourse.com  Fact Sheet for Healthcare Providers: SeriousBroker.it  This test is not yet approved or cleared by the [place] FDA and has been authorized for detection and/or diagnosis of SARS-CoV-2 by FDA under an Emergency Use Authorization (EUA). This EUA will remain in effect (meaning this test can be used) for the duration of  the COVID-19 declaration under Section 564(b)(1) of the Act, 21 U.S.C. section 360bbb-3(b)(1), unless the authorization is terminated or revoked.     Resp Syncytial Virus by PCR NEGATIVE NEGATIVE    Comment: (NOTE) Fact Sheet for Patients: BloggerCourse.com  Fact Sheet for Healthcare Providers: SeriousBroker.it  This test is not yet approved or cleared by the [place] FDA and has been authorized for detection and/or diagnosis of SARS-CoV-2 by FDA under an  Emergency Use Authorization (EUA). This EUA will remain in effect (meaning this test can be used) for the duration of the COVID-19 declaration under Section 564(b)(1) of the Act, 21 U.S.C. section 360bbb-3(b)(1), unless the authorization is terminated or revoked.  Performed at [facility], [address], [place], [place] [address]   Comprehensive metabolic panel     Status: Abnormal   Collection Time: 01/06/23  3:43 PM  Result Value Ref Range   Sodium 139 135 - 145 mmol/L   Potassium 3.9 3.5 - 5.1 mmol/L    Comment: HEMOLYSIS AT THIS LEVEL MAY AFFECT RESULT   Chloride 102 98 - 111 mmol/L   CO2 26 22 - 32 mmol/L   Glucose, Bld 65 (L) 70 - 99 mg/dL    Comment: Glucose reference range applies only to samples taken after fasting for at least 8 hours.   BUN 22 8 - 23 mg/dL   Creatinine, Ser 6.04 (H) 0.44 - 1.00 mg/dL   Calcium 8.4 (L) 8.9 - 10.3 mg/dL   Total Protein 5.0 (L) 6.5 - 8.1 g/dL   Albumin 2.7 (L) 3.5 - 5.0 g/dL   AST 27 15 - 41 U/L    Comment: HEMOLYSIS AT THIS LEVEL MAY AFFECT RESULT   ALT 9 0 - 44 U/L    Comment: HEMOLYSIS AT THIS LEVEL MAY AFFECT RESULT   Alkaline Phosphatase 42 38 - 126 U/L   Total Bilirubin 1.0 0.3 - 1.2 mg/dL    Comment: HEMOLYSIS AT THIS LEVEL MAY AFFECT RESULT   GFR, Estimated 39 (L) >60 mL/min    Comment: (NOTE) Calculated using the CKD-EPI Creatinine Equation (2021)    Anion gap 11 5 - 15    Comment: Performed at [facility], [address] [place], [place] [address]  CBC with Differential     Status: Abnormal   Collection Time: 01/06/23  3:43 PM  Result Value Ref Range   WBC 15.5 (H) 4.0 - 10.5 K/uL   RBC 3.88 3.87 - 5.11 MIL/uL   Hemoglobin 11.7 (L) 12.0 - 15.0 g/dL   HCT 11.9 14.7 - 82.9 %   MCV 94.6 80.0 - 100.0 fL   MCH 30.2 26.0 - 34.0 pg   MCHC 31.9 30.0 - 36.0 g/dL   RDW 56.2 13.0 - 86.5 %   Platelets 281 150 - 400 K/uL   nRBC 0.0 0.0 - 0.2 %   Neutrophils Relative % 87 %   Neutro Abs 13.4 (H) 1.7 - 7.7 K/uL    Lymphocytes Relative 5 %   Lymphs Abs 0.7 0.7 - 4.0 K/uL   Monocytes Relative 5 %   Monocytes Absolute 0.8 0.1 - 1.0 K/uL   Eosinophils Relative 2 %   Eosinophils Absolute 0.4 0.0 - 0.5 K/uL   Basophils Relative 0 %   Basophils Absolute 0.0 0.0 - 0.1 K/uL   Immature Granulocytes 1 %   Abs Immature Granulocytes 0.17 (H) 0.00 - 0.07 K/uL    Comment: Performed at [facility], [address]  [place], [place] [address]  Protime-INR     Status: None   Collection Time: 01/06/23  3:43 PM  Result Value Ref Range   Prothrombin Time 14.3 11.4 - 15.2 seconds   INR 1.1 0.8 - 1.2    Comment: (NOTE) INR goal varies based on device and disease states. Performed at [facility], [address] [place], [place] [address]   APTT     Status: Abnormal   Collection Time: 01/06/23  3:43 PM  Result Value Ref Range   aPTT 21 (L) 24 - 36 seconds    Comment: Performed at [facility], [address] [place], [place] [address]  Blood Culture (routine x 2)     Status: None (Preliminary result)   Collection Time: 01/06/23  3:43 PM   Specimen: BLOOD LEFT FOREARM  Result Value Ref Range   Specimen Description BLOOD LEFT FOREARM    Special Requests      BOTTLES DRAWN AEROBIC AND ANAEROBIC Blood Culture adequate volume   Culture      NO GROWTH < 24 HOURS Performed at [facility], [address], [place], [place] [address]    Report Status PENDING   Blood Culture (routine x 2)     Status: None (Preliminary result)   Collection Time: 01/06/23  3:55 PM   Specimen: BLOOD  Result Value Ref Range   Specimen Description BLOOD RIGHT ANTECUBITAL    Special Requests      BOTTLES DRAWN AEROBIC AND ANAEROBIC Blood Culture adequate volume   Culture      NO GROWTH < 24 HOURS Performed at [facility], [address] [place], [place] [address]    Report Status PENDING   I-Stat Lactic Acid, ED     Status: Abnormal   Collection Time: 01/06/23  4:29 PM  Result Value Ref Range    Lactic Acid, Venous 4.2 (HH) 0.5 - 1.9 mmol/L   Comment NOTIFIED PHYSICIAN   I-Stat Lactic Acid, ED     Status: Abnormal   Collection Time: 01/06/23  5:42 PM  Result Value Ref Range   Lactic Acid, Venous 2.1 (HH) 0.5 - 1.9 mmol/L   Comment NOTIFIED PHYSICIAN   Urinalysis, w/ Reflex to Culture (Infection Suspected) -Urine, Clean Catch     Status: Abnormal   Collection Time: 01/06/23 10:09 PM  Result Value Ref Range   Specimen Source URINE, CLEAN CATCH    Color, Urine YELLOW YELLOW   APPearance CLEAR CLEAR   Specific Gravity, Urine 1.013 1.005 - 1.030   pH 6.0 5.0 - 8.0   Glucose, UA NEGATIVE NEGATIVE mg/dL   Hgb urine dipstick NEGATIVE NEGATIVE   Bilirubin Urine NEGATIVE NEGATIVE   Ketones, ur NEGATIVE NEGATIVE mg/dL   Protein, ur NEGATIVE NEGATIVE mg/dL   Nitrite NEGATIVE NEGATIVE   Leukocytes,Ua NEGATIVE NEGATIVE   RBC / HPF 0-5 0 - 5 RBC/hpf   WBC, UA 0-5 0 - 5 WBC/hpf    Comment:        Reflex urine culture not performed if WBC <=10, OR if Squamous epithelial cells >5. If Squamous epithelial cells >5 suggest recollection.    Bacteria, UA RARE (A) NONE SEEN   Squamous Epithelial / HPF 0-5 0 - 5 /HPF   Mucus PRESENT    Hyaline Casts, UA PRESENT     Comment: Performed at [facility], [address] [place], [place] [address]  Glucose, capillary     Status: None   Collection  Time: 01/07/23  4:11 AM  Result Value Ref Range   Glucose-Capillary 72 70 - 99 mg/dL    Comment: Glucose reference range applies only to samples taken after fasting for at least 8 hours.  CBC     Status: Abnormal   Collection Time: 01/07/23  7:38 AM  Result Value Ref Range   WBC 15.6 (H) 4.0 - 10.5 K/uL   RBC 3.57 (L) 3.87 - 5.11 MIL/uL   Hemoglobin 11.2 (L) 12.0 - 15.0 g/dL   HCT 14.7 (L) 82.9 - 56.2 %   MCV 92.4 80.0 - 100.0 fL   MCH 31.4 26.0 - 34.0 pg   MCHC 33.9 30.0 - 36.0 g/dL   RDW 13.0 86.5 - 78.4 %   Platelets 241 150 - 400 K/uL   nRBC 0.0 0.0 - 0.2 %    Comment: Performed  at [facility], [address] [place], [place] [address]  Basic metabolic panel     Status: Abnormal   Collection Time: 01/07/23  7:38 AM  Result Value Ref Range   Sodium 136 135 - 145 mmol/L   Potassium 4.0 3.5 - 5.1 mmol/L   Chloride 102 98 - 111 mmol/L   CO2 23 22 - 32 mmol/L   Glucose, Bld 74 70 - 99 mg/dL    Comment: Glucose reference range applies only to samples taken after fasting for at least 8 hours.   BUN 23 8 - 23 mg/dL   Creatinine, Ser 5.28 (H) 0.44 - 1.00 mg/dL   Calcium 8.2 (L) 8.9 - 10.3 mg/dL   GFR, Estimated 47 (L) >60 mL/min    Comment: (NOTE) Calculated using the CKD-EPI Creatinine Equation (2021)    Anion gap 11 5 - 15    Comment: Performed at [facility], [address] [place], [place] [address]  Brain natriuretic peptide     Status: Abnormal   Collection Time: 01/07/23  7:38 AM  Result Value Ref Range   B Natriuretic Peptide 385.1 (H) 0.0 - 100.0 pg/mL    Comment: Performed at [facility], [address] [place], [place] [address]  Glucose, capillary     Status: Abnormal   Collection Time: 01/07/23  8:07 AM  Result Value Ref Range   Glucose-Capillary 65 (L) 70 - 99 mg/dL    Comment: Glucose reference range applies only to samples taken after fasting for at least 8 hours.    CT ABDOMEN PELVIS WO CONTRAST  Result Date: 01/06/2023 CLINICAL DATA:  Hypotension, nausea, and suspected small-bowel obstruction. Previous history of small-bowel obstruction. Questionable sepsis. EXAM: CT ABDOMEN AND PELVIS WITHOUT CONTRAST TECHNIQUE: Multidetector CT imaging of the abdomen and pelvis was performed following the standard protocol without IV contrast. RADIATION DOSE REDUCTION: This exam was performed according to the departmental dose-optimization program which includes automated exposure control, adjustment of the mA and/or kV according to patient size and/or use of iterative reconstruction technique. COMPARISON:  CTs with IV contrast 09/04/2022  and 08/23/2019. FINDINGS: Lower chest: There are trace pleural effusions. Subpleural linear atelectasis in the lower lobes. Mild chronic elevation right hemidiaphragm. New from 2021 but stable since 09/04/2022, there is a 6 mm right middle lobe noncalcified nodule on 4:2, a 5 mm stable noncalcified right lower lobe anterior basal segment nodule on 4:9, and adjacent 2 mm subpleural right middle lobe nodules on 4:7. Four months of stability is confirmed but there could be other nodules above the plane of current and prior imaging. Chest CT  is recommended to assess for additional nodules. No focal consolidation is seen. There is mild diffuse bronchial thickening. Mild cardiomegaly is unchanged. No pericardial effusion. There is metal artifact from pacemaker wiring in the right heart. Coronary artery calcifications. Small hiatal hernia. Hepatobiliary: No liver masses seen without contrast. There are occasional calcified granulomas. There are they few small stones layering in the proximal gallbladder but no wall thickening no biliary dilatation. Pancreas: Partially atrophic. Otherwise unremarkable without contrast. Spleen: No abnormality is seen without contrast. Adrenals/Urinary Tract: There is no adrenal mass. Bilateral renal cortical thinning is again noted. A 1.7 cm Bosniak 1 cyst is again seen in the midpole left kidney anteriorly, Hounsfield density is 2. No follow-up imaging is recommended. No other contour deforming deformity abnormality is seen of the kidneys. There is no urinary stone or obstruction. The bladder is unremarkable for the degree of distention. Stomach/Bowel: Interval increased thickening at the GE junction warranting endoscopic follow-up. Rest of the gastric wall is contracted. There is a mildly dilated postsurgical small bowel segment in the midline to right paramidline lower abdomen, measuring 3.6 cm caliber. Remainder of the small bowel is normal caliber. This could be due to a low-grade  partial small bowel obstruction along the surgical segment or possibly due to ileus. An appendix is not seen in this patient. There is either wall thickening or underdistention in the proximal transverse colon to the midline, with colitis favored as there are hazy reactive mesenteric inflammatory changes in the area. There is mild fecal stasis in the distal transverse, descending and rectosigmoid colon, without evidence of colitis. There is uncomplicated sigmoid diverticulitis. A moderate-sized stool ball distends the rectum but there is no wall thickening. Vascular/Lymphatic: Extensive aortoiliac and visceral abdominal arterial calcific plaques. No AAA. No adenopathy is seen. Reproductive: Uterus and bilateral adnexa are unremarkable. Other: Small umbilical fat hernia. No incarcerated hernia. Trace ascites in the posterior pelvis and subhepatic space. No drainable pocket is seen. There is no free air, free hemorrhage or localizing collection. Musculoskeletal: Osteopenia and advanced degenerative change lower thoracic and lumbar spine. Bone-on-bone superior joint space loss of both hips with acetabular osteophytes. Acquired spinal stenosis again noted L1-2, L2-3, and L3-4 with multilevel lumbar foraminal stenosis. No suspicious regional osseous lesions. IMPRESSION: 1. Mildly dilated postsurgical small bowel segment in the midline to right paramidline lower abdomen, measuring 3.6 cm caliber. This could be due to a low-grade partial small bowel obstruction along the surgical segment or possibly due to ileus. 2. Proximal to mid transverse colon wall thickening or underdistention, with colitis favored. 3. Constipation and uncomplicated sigmoid diverticulitis. 4. Trace ascites. No drainable pocket. 5. Trace pleural effusions with bibasilar atelectasis. 6. Cholelithiasis. 7. Aortic and coronary artery atherosclerosis. 8. Osteopenia and advanced degenerative change. 9. 6 mm and 5 mm right lung base nodules are stable since  09/04/2022 but new since 2021. Nonemergent chest CT is recommended to look for other nodules. Aortic Atherosclerosis (ICD10-I70.0). Electronically Signed   By: Almira Bar M.D.   On: 01/06/2023 21:03   DG Elbow Complete Left  Result Date: 01/06/2023 CLINICAL DATA:  Hypotension. Possible fall. Large skin tear to right forearm. Unclear etiology. Unable to right elbow. EXAM: LEFT ELBOW - COMPLETE 3+ VIEW; RIGHT ELBOW - COMPLETE 3+ VIEW COMPARISON:  Radiographs 01/06/2009 FINDINGS: Right: Severe erosive arthritic changes are redemonstrated. There is posterior displacement of the ulna in relation to the humerus. Large 3.7 cm osseous fragment anterior to the humerus compatible with age-indeterminate fracture fragment. Left: Severe erosive  changes. Plate and screw fixation distal humerus. No definite acute fracture. No dislocation. IMPRESSION: 1. Posterior displacement of the right ulna in relation to the humerus. 2. Large 3.7 cm osseous fragment anterior to the right humerus compatible with age-indeterminate fracture fragment. 3. Severe erosive arthritic changes bilaterally. Electronically Signed   By: Minerva Fester M.D.   On: 01/06/2023 20:08   DG Elbow Complete Right  Result Date: 01/06/2023 CLINICAL DATA:  Hypotension. Possible fall. Large skin tear to right forearm. Unclear etiology. Unable to right elbow. EXAM: LEFT ELBOW - COMPLETE 3+ VIEW; RIGHT ELBOW - COMPLETE 3+ VIEW COMPARISON:  Radiographs 01/06/2009 FINDINGS: Right: Severe erosive arthritic changes are redemonstrated. There is posterior displacement of the ulna in relation to the humerus. Large 3.7 cm osseous fragment anterior to the humerus compatible with age-indeterminate fracture fragment. Left: Severe erosive changes. Plate and screw fixation distal humerus. No definite acute fracture. No dislocation. IMPRESSION: 1. Posterior displacement of the right ulna in relation to the humerus. 2. Large 3.7 cm osseous fragment anterior to the right  humerus compatible with age-indeterminate fracture fragment. 3. Severe erosive arthritic changes bilaterally. Electronically Signed   By: Minerva Fester M.D.   On: 01/06/2023 20:08   DG Chest Port 1 View  Result Date: 01/06/2023 CLINICAL DATA:  Questionable sepsis-evaluate for abnormality, nausea, vomiting, weakness, hypotension EXAM: PORTABLE CHEST 1 VIEW COMPARISON:  03/04/2022 FINDINGS: Stable cardiomegaly. Aortic atherosclerotic calcification. Left midlung scarring. Left basilar atelectasis. No focal consolidation, pleural effusion, or pneumothorax. No displaced rib fractures. Left chest wall pacemaker. Left TSA. IMPRESSION: No active disease. Electronically Signed   By: Minerva Fester M.D.   On: 01/06/2023 20:03   CT Head Wo Contrast  Result Date: 01/06/2023 CLINICAL DATA:  Head trauma EXAM: CT HEAD WITHOUT CONTRAST TECHNIQUE: Contiguous axial images were obtained from the base of the skull through the vertex without intravenous contrast. RADIATION DOSE REDUCTION: This exam was performed according to the departmental dose-optimization program which includes automated exposure control, adjustment of the mA and/or kV according to patient size and/or use of iterative reconstruction technique. COMPARISON:  None Available. FINDINGS: Brain: There is no mass, hemorrhage or extra-axial collection. There is generalized atrophy without lobar predilection. Hypodensity of the white matter is most commonly associated with chronic microvascular disease. Old anterior right MCA territory infarct. Vascular: Atherosclerotic calcification of the vertebral and internal carotid arteries at the skull base. No abnormal hyperdensity of the major intracranial arteries or dural venous sinuses. Skull: The visualized skull base, calvarium and extracranial soft tissues are normal. Sinuses/Orbits: No fluid levels or advanced mucosal thickening of the visualized paranasal sinuses. No mastoid or middle ear effusion. Normal orbits.  IMPRESSION: 1. No acute intracranial abnormality. 2. Old anterior right MCA territory infarct and findings of chronic microvascular disease. Electronically Signed   By: Deatra Robinson M.D.   On: 01/06/2023 19:08    Review of Systems  HENT:  Negative for ear discharge, ear pain, hearing loss and tinnitus.   Eyes:  Negative for photophobia and pain.  Respiratory:  Negative for cough and shortness of breath.   Cardiovascular:  Negative for chest pain.  Gastrointestinal:  Negative for abdominal pain, nausea and vomiting.  Genitourinary:  Negative for dysuria, flank pain, frequency and urgency.  Musculoskeletal:  Positive for arthralgias (Right elbow). Negative for back pain, myalgias and neck pain.  Neurological:  Negative for dizziness and headaches.  Hematological:  Does not bruise/bleed easily.  Psychiatric/Behavioral:  The patient is not nervous/anxious.    Blood pressure [name] Kitchen)  131/46, pulse 72, temperature 97.7 F (36.5 C), temperature source Oral, resp. rate 18, height 5' (1.524 m), weight 56.7 kg, SpO2 99%. Physical Exam Constitutional:      General: She is not in acute distress.    Appearance: She is well-developed. She is not diaphoretic.  HENT:     Head: Normocephalic and atraumatic.  Eyes:     General: No scleral icterus.       Right eye: No discharge.        Left eye: No discharge.     Conjunctiva/sclera: Conjunctivae normal.  Cardiovascular:     Rate and Rhythm: Normal rate and regular rhythm.  Pulmonary:     Effort: Pulmonary effort is normal. No respiratory distress.  Musculoskeletal:     Cervical back: Normal range of motion.     Comments: Right shoulder, elbow, wrist, digits- Posterior elbow splint in place, mild diffuse elbow TTP, no instability, no blocks to motion  Sens  Ax/R/M/U intact  Mot   Ax/ R/ PIN/ M/ AIN/ U intact  Fingers perfused  Skin:    General: Skin is warm and dry.  Neurological:     Mental Status: She is alert.  Psychiatric:        Mood and  Affect: Mood normal.        Behavior: Behavior normal.     Assessment/Plan: Right elbow fx -- Given mildness of symptoms and no recent trauma suspect x-ray findings are subacute at best. Dr. Yehuda Budd to evaluate later today for definitive plan.    Freeman Caldron, PA-C Orthopedic Surgery [phone] 01/07/2023, 9:05 AM

## 2023-01-07 NOTE — Progress Notes (Signed)
Orthopedic Tech Progress Note Patient Details:  Pamela Dunn 1931/12/24 [id number]  Ortho Devices Type of Ortho Device: Long arm splint Ortho Device/Splint Location: RUE Ortho Device/Splint Interventions: Ordered, Application, Adjustment   Post Interventions Patient Tolerated: Well Instructions Provided: Care of device Confirmed with EDP that they were not reducing the patients elbow prior to splint application. Splint applied in an as is fashion due to how displaced their arm is. [place] A Leighla Chestnutt 01/07/2023, 12:20 AM

## 2023-01-08 DIAGNOSIS — K529 Noninfective gastroenteritis and colitis, unspecified: Secondary | ICD-10-CM | POA: Diagnosis not present

## 2023-01-08 LAB — GLUCOSE, CAPILLARY: Glucose-Capillary: 89 mg/dL (ref 70–99)

## 2023-01-08 MED ORDER — PREDNISONE 20 MG PO TABS
40.0000 mg | ORAL_TABLET | Freq: Every day | ORAL | Status: AC
  Administered 2023-01-09: 40 mg via ORAL
  Filled 2023-01-08: qty 2

## 2023-01-08 MED ORDER — PREDNISONE 10 MG (21) PO TBPK: 20.0000 mg | ORAL_TABLET | Freq: Every evening | ORAL | Status: DC

## 2023-01-08 MED ORDER — PREDNISONE 10 MG (21) PO TBPK: 10.0000 mg | ORAL_TABLET | ORAL | Status: DC

## 2023-01-08 MED ORDER — PREDNISONE 10 MG (21) PO TBPK: 10.0000 mg | ORAL_TABLET | Freq: Four times a day (QID) | ORAL | Status: DC

## 2023-01-08 MED ORDER — PREDNISONE 10 MG PO TABS
10.0000 mg | ORAL_TABLET | Freq: Every day | ORAL | Status: AC
  Administered 2023-01-12: 10 mg via ORAL
  Filled 2023-01-08: qty 1

## 2023-01-08 MED ORDER — VITAMIN D 25 MCG (1000 UNIT) PO TABS
1000.0000 [IU] | ORAL_TABLET | Freq: Every day | ORAL | Status: DC
  Administered 2023-01-08 – 2023-01-12 (×5): 1000 [IU] via ORAL
  Filled 2023-01-08 (×5): qty 1

## 2023-01-08 MED ORDER — PREDNISONE 10 MG (21) PO TBPK
20.0000 mg | ORAL_TABLET | Freq: Every morning | ORAL | Status: DC
  Filled 2023-01-08: qty 21

## 2023-01-08 MED ORDER — PREDNISONE 10 MG PO TABS: 5.0000 mg | ORAL_TABLET | Freq: Every day | ORAL | Status: DC

## 2023-01-08 MED ORDER — PREDNISONE 10 MG (21) PO TBPK: 10.0000 mg | ORAL_TABLET | Freq: Three times a day (TID) | ORAL | Status: DC

## 2023-01-08 MED ORDER — PREDNISONE 20 MG PO TABS
30.0000 mg | ORAL_TABLET | Freq: Every day | ORAL | Status: AC
  Administered 2023-01-10: 30 mg via ORAL
  Filled 2023-01-08: qty 1

## 2023-01-08 MED ORDER — PREDNISONE 20 MG PO TABS
50.0000 mg | ORAL_TABLET | Freq: Once | ORAL | Status: AC
  Administered 2023-01-08: 50 mg via ORAL
  Filled 2023-01-08: qty 1

## 2023-01-08 MED ORDER — ASPIRIN-CAFFEINE 500-32.5 MG PO TABS: 1.0000 | ORAL_TABLET | Freq: Four times a day (QID) | ORAL | Status: DC | PRN

## 2023-01-08 MED ORDER — COQ10 200 MG PO CAPS: 200.0000 mg | ORAL_CAPSULE | Freq: Every day | ORAL | Status: DC

## 2023-01-08 MED ORDER — DICLOFENAC SODIUM 1 % EX GEL
2.0000 g | Freq: Four times a day (QID) | CUTANEOUS | Status: DC
  Administered 2023-01-08 – 2023-01-12 (×15): 2 g via TOPICAL
  Filled 2023-01-08 (×2): qty 100

## 2023-01-08 MED ORDER — OXYCODONE HCL 5 MG PO TABS
5.0000 mg | ORAL_TABLET | Freq: Once | ORAL | Status: AC
  Administered 2023-01-08: 5 mg via ORAL
  Filled 2023-01-08: qty 1

## 2023-01-08 MED ORDER — PREDNISONE 20 MG PO TABS
20.0000 mg | ORAL_TABLET | Freq: Every day | ORAL | Status: AC
  Administered 2023-01-11: 20 mg via ORAL
  Filled 2023-01-08: qty 1

## 2023-01-08 NOTE — Plan of Care (Signed)

## 2023-01-08 NOTE — Evaluation (Signed)
Physical Therapy Evaluation Patient Details Name: Pamela Dunn MRN: [id number] DOB: 1931/06/08 Today's Date: 01/08/2023  History of Present Illness  Pt is 89 yo female who presents with weakness on 01/06/23. 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 PMH: RA, GERD, HTN, HLD, pacemaker, CKD3, CHF, LBBB  Clinical Impression  Pt admitted with above diagnosis. Pt from home alone with supportive family. Was ambulating with RW and had aide come 1x/wk to assist with shower. Currently pt's mobility is significantly limited due to RUE splint and LUE pain so pt unable to use AD to help with balance. Thus pt is needing 2 person assist to ambulate and do not recommend her going straight home. She is also unable to feed self with current UE limitations. Patient will benefit from continued inpatient follow up therapy, <3 hours/day Pt currently with functional limitations due to the deficits listed below (see PT Problem List). Pt will benefit from acute skilled PT to increase their independence and safety with mobility to allow discharge.           If plan is discharge home, recommend the following: Two people to help with walking and/or transfers;A lot of help with bathing/dressing/bathroom;Assistance with cooking/housework;Assistance with feeding;Assist for transportation;Help with stairs or ramp for entrance   Can travel by private vehicle   Yes    Equipment Recommendations Other (comment) (TBD)  Recommendations for Other Services       Functional Status Assessment Patient has had a recent decline in their functional status and demonstrates the ability to make significant improvements in function in a reasonable and predictable amount of time.     Precautions / Restrictions Precautions Precautions: Fall Precaution Comments: slid out of chair 2 weeks ago Restrictions Weight Bearing Restrictions: No RUE Weight Bearing: Non weight bearing (nonunion of ulna and elbow, but no  formal WB orders) Other Position/Activity Restrictions: R UE splinted      Mobility  Bed Mobility Overal bed mobility: Needs Assistance Bed Mobility: Supine to Sit, Sit to Supine     Supine to sit: Min assist, HOB elevated Sit to supine: Min assist   General bed mobility comments: able to swing LE to EOB and lift trunk w/ Min A to scoot fully EOB. Min A for BLE back to bed    Transfers Overall transfer level: Needs assistance Equipment used: 2 person hand held assist, Ambulation equipment used Transfers: Sit to/from Stand Sit to Stand: Min assist, +2 physical assistance, +2 safety/equipment           General transfer comment: Min A x 2 to stand with Stedy with elevated bed to allow pt to reach LUE to [place] bar more comfortably. able to stand again with +2 assist under arms so she could use her shoulder and didn't have to use hands to help stand    Ambulation/Gait Ambulation/Gait assistance: Mod assist, +2 physical assistance Gait Distance (Feet): 20 Feet Assistive device: 2 person hand held assist Gait Pattern/deviations: Step-through pattern Gait velocity: decreased Gait velocity interpretation: <1.31 ft/sec, indicative of household ambulator   General Gait Details: pt needs UE support to safely ambulate but is limited by decreased ability to use UE's, able to ambulate with support B upper arms and wrists  Stairs            Wheelchair Mobility     Tilt Bed    Modified Rankin (Stroke Patients Only)       Balance Overall balance assessment: Needs assistance Sitting-balance support:  No upper extremity supported, Feet supported Sitting balance-Leahy Scale: Fair     Standing balance support: Bilateral upper extremity supported, Single extremity supported, During functional activity Standing balance-Leahy Scale: Poor Standing balance comment: needs UE support                             Pertinent Vitals/Pain Pain Assessment Pain Assessment:  0-10 Pain Score: 5  Pain Location: LUE Pain Descriptors / Indicators: Sore Pain Intervention(s): Limited activity within patient's tolerance, Monitored during session    Home Living Family/patient expects to be discharged to:: Private residence Living Arrangements: Alone Available Help at Discharge: Family;Available 24 hours/day Type of Home: House Home Access: Ramped entrance       Home Layout: One level Home Equipment: Agricultural consultant (2 wheels);Shower seat;Cane - single point;Grab bars - tub/shower;Rollator (4 wheels) (tray on wheels in kitchen; upright Rollator)      Prior Function Prior Level of Function : Needs assist             Mobility Comments: ambulates with RW and furniture walks in the home. uses upright Rollator in the community ADLs Comments: caregiver comes and helps with shower 1x/wk and has a cleaner 2x/mo. Family looking into getting more care. pt typically able to manage ADLs, light meal prep, etc. Son assists with community tasks     Extremity/Trunk Assessment   Upper Extremity Assessment Upper Extremity Assessment: Left hand dominant;Defer to OT evaluation    Lower Extremity Assessment Lower Extremity Assessment: Generalized weakness;RLE deficits/detail;LLE deficits/detail RLE Deficits / Details: Pt has R chronic knee pain, ROM WFL, arthritic changes at B ankles. Discussed proper footwear with family after session. Hip flex 3/5, knee ext >3/5 RLE Sensation: decreased proprioception RLE Coordination: WNL LLE Deficits / Details: hip flex 3/5. knee ext >3/5. Arthritic changes L ankle LLE Sensation: decreased proprioception LLE Coordination: WNL    Cervical / Trunk Assessment Cervical / Trunk Assessment: Kyphotic  Communication   Communication Communication: No apparent difficulties  Cognition Arousal: Alert Behavior During Therapy: WFL for tasks assessed/performed Overall Cognitive Status: Within Functional Limits for tasks assessed                                           General Comments General comments (skin integrity, edema, etc.): HR 90 bpm    Exercises     Assessment/Plan    PT Assessment Patient needs continued PT services  PT Problem List Decreased strength;Decreased range of motion;Decreased activity tolerance;Decreased balance;Decreased mobility;Decreased cognition;Decreased knowledge of precautions;Pain       PT Treatment Interventions DME instruction;Gait training;Functional mobility training;Therapeutic activities;Therapeutic exercise;Balance training;Patient/family education    PT Goals (Current goals can be found in the Care Plan section)  Acute Rehab PT Goals Patient Stated Goal: return home but agreeable to SNF first for rehab PT Goal Formulation: With patient Time For Goal Achievement: 01/22/23 Potential to Achieve Goals: Good    Frequency Min 1X/week     Co-evaluation PT/OT/SLP Co-Evaluation/Treatment: Yes Reason for Co-Treatment: Complexity of the patient's impairments (multi-system involvement);For patient/therapist safety;To address functional/ADL transfers PT goals addressed during session: Mobility/safety with mobility;Balance OT goals addressed during session: ADL's and self-care;Proper use of Adaptive equipment and DME       AM-PAC PT "6 Clicks" Mobility  Outcome Measure Help needed turning from your back to your side while in a flat  bed without using bedrails?: A Little Help needed moving from lying on your back to sitting on the side of a flat bed without using bedrails?: A Little Help needed moving to and from a bed to a chair (including a wheelchair)?: Total Help needed standing up from a chair using your arms (e.g., wheelchair or bedside chair)?: Total Help needed to walk in hospital room?: Total Help needed climbing 3-5 steps with a railing? : Total 6 Click Score: 10    End of Session Equipment Utilized During Treatment: Gait belt Activity Tolerance: Patient  tolerated treatment well Patient left: in bed;with call bell/phone within reach;with bed alarm set Nurse Communication: Mobility status PT Visit Diagnosis: Unsteadiness on feet (R26.81);Repeated falls (R29.6);Muscle weakness (generalized) (M62.81);Pain;Difficulty in walking, not elsewhere classified (R26.2) Pain - Right/Left: Left Pain - part of body: Arm    Time: 1610-9604 PT Time Calculation (min) (ACUTE ONLY): 39 min   Charges:   PT Evaluation $PT Eval Moderate Complexity: 1 Mod   PT General Charges $$ ACUTE PT VISIT: 1 Visit         Lyanne Co, PT  Acute Rehab Services Secure chat preferred Office [phone]   [name] Chambers Chevon Laufer 01/08/2023, 12:37 PM

## 2023-01-08 NOTE — Progress Notes (Signed)
Patient and son refused to have patient blood pressure/full vitals signs checked. Patient made aware that she is a routine/Q$ vitals check. Patient has a fx to R elbow and swelling and tenderness to L arm and complained of the blood pressure cuff being to tight. Expressed to patient that we could use her lower limbs to check her blood pressure but patient refused to have [place] utilize her lower extremities to check BP.

## 2023-01-08 NOTE — TOC Initial Note (Addendum)
Transition of Care [facility]) - Initial/Assessment Note    Patient Details  Name: Pamela Dunn MRN: [id number] Date of Birth: 09-Aug-1931  Transition of Care [facility]) CM/SW Contact:    Abdiaziz Klahn A [place], Theresia Majors Phone Number: 01/08/2023, 2:26 PM  Clinical Narrative:                  CSW met with pt and pt's family member at bedside. She said she was agreeable to recommendation for SNF and had a preference for [facility].   SNF workup completed. CSW to start insurance authorization when closer to medical stability.   TOC will continue to follow.   Expected Discharge Plan: Skilled Nursing Facility Barriers to Discharge: Continued Medical Work up, English as a second language teacher, SNF Pending bed offer   Patient Goals and CMS Choice Patient states their goals for this hospitalization and ongoing recovery are:: Want to go to [facility]          Expected Discharge Plan and Services                                              Prior Living Arrangements/Services              Need for Family Participation in Patient Care: Yes (Comment) Care giver support system in place?: Yes (comment)      Activities of Daily Living   ADL Screening (condition at time of admission) Independently performs ADLs?: No Does the patient have a NEW difficulty with bathing/dressing/toileting/self-feeding that is expected to last >3 days?: No Does the patient have a NEW difficulty with getting in/out of bed, walking, or climbing stairs that is expected to last >3 days?: No Does the patient have a NEW difficulty with communication that is expected to last >3 days?: No Is the patient deaf or have difficulty hearing?: No Does the patient have difficulty seeing, even when wearing glasses/contacts?: Yes Does the patient have difficulty concentrating, remembering, or making decisions?: No  Permission Sought/Granted                  Emotional Assessment Appearance:: Appears stated  age Attitude/Demeanor/Rapport: Engaged Affect (typically observed): Appropriate Orientation: : Oriented to Self, Oriented to Place, Oriented to  Time, Oriented to Situation Alcohol / Substance Use: Not Applicable Psych Involvement: No (comment)  Admission diagnosis:  Dehydration [E86.0] Colitis [K52.9] Right elbow pain [M25.521] Diarrhea, unspecified type [R19.7] Fracture of upper arm [S42.309A] Patient Active Problem List   Diagnosis Date Noted   Hypoglycemia 01/07/2023   Incidental pulmonary nodule 01/07/2023   Fracture of upper arm 01/07/2023   Colitis 01/06/2023   Pacemaker Abbott Assurity MRI model [id number] 07/12/2020 07/12/2020   Encounter for care of pacemaker 07/12/2020   Heart block AV complete (HCC) 07/11/2020   Pressure injury of skin 08/27/2019   Hypothyroidism 08/23/2019   Chronic diastolic CHF (congestive heart failure) (HCC) 08/23/2019   SBO (small bowel obstruction) (HCC) 08/17/2019   Multiple pulmonary nodules 06/19/2017   Metatarsalgia of left foot 07/13/2014   Hx of adenomatous colonic polyps 09/05/2013   Spinal stenosis of lumbar region 09/05/2013   Lumbar disc disease 09/05/2013   Hyperlipidemia 10/23/2009   GERD 10/23/2009   Rheumatoid arthritis (HCC) 10/23/2009   ABDOMINAL PAIN, UNSPECIFIED SITE 10/23/2009   ANEMIA, HX OF 10/23/2009   PEPTIC ULCER DISEASE, HX OF 10/23/2009   PCP:  Irena Reichmann, DO  Pharmacy:   [id number] [place], [place] - [phone] AT [facility] OF [address] [place] [address] Phone: [phone] Fax: [phone]  [facility] - [place], [place] - [address] [place] [address] Phone: [phone] Fax: [phone]     Social Determinants of Health (SDOH) Social History: SDOH Screenings   Food Insecurity: No Food Insecurity (01/07/2023)  Housing: Low Risk  (01/07/2023)  Transportation Needs: No Transportation Needs (01/07/2023)   Utilities: Not At Risk (01/07/2023)  Tobacco Use: Low Risk  (01/06/2023)   SDOH Interventions:     Readmission Risk Interventions    07/13/2020   10:18 AM  Readmission Risk Prevention Plan  Transportation Screening Complete  PCP or Specialist Appt within 5-7 Days Complete  Home Care Screening Complete  Medication Review (RN CM) Complete

## 2023-01-08 NOTE — NC FL2 (Signed)
[place] MEDICAID FL2 LEVEL OF CARE FORM     IDENTIFICATION  Patient Name: Pamela Dunn Birthdate: 20-Nov-1931 Sex: female Admission Date (Current Location): 01/06/2023  [facility] and [place] Number:  Producer, television/film/video and Address:  [facility] [place], [address], [place], [place] [address]      Provider Number: [id number]  Attending Physician Name and Address:  Azucena Fallen, MD  Relative Name and Phone Number:  Jesselynn Hanas- [phone], daughter in law    Current Level of Care: Hospital Recommended Level of Care: Skilled Nursing Facility Prior Approval Number:    Date Approved/Denied:   PASRR Number: [id number]  Discharge Plan: SNF    Current Diagnoses: Patient Active Problem List   Diagnosis Date Noted   Hypoglycemia 01/07/2023   Incidental pulmonary nodule 01/07/2023   Fracture of upper arm 01/07/2023   Colitis 01/06/2023   Pacemaker Abbott Assurity MRI model [id number] 07/12/2020 07/12/2020   Encounter for care of pacemaker 07/12/2020   Heart block AV complete (HCC) 07/11/2020   Pressure injury of skin 08/27/2019   Hypothyroidism 08/23/2019   Chronic diastolic CHF (congestive heart failure) (HCC) 08/23/2019   SBO (small bowel obstruction) (HCC) 08/17/2019   Multiple pulmonary nodules 06/19/2017   Metatarsalgia of left foot 07/13/2014   Hx of adenomatous colonic polyps 09/05/2013   Spinal stenosis of lumbar region 09/05/2013   Lumbar disc disease 09/05/2013   Hyperlipidemia 10/23/2009   GERD 10/23/2009   Rheumatoid arthritis (HCC) 10/23/2009   ABDOMINAL PAIN, UNSPECIFIED SITE 10/23/2009   ANEMIA, HX OF 10/23/2009   PEPTIC ULCER DISEASE, HX OF 10/23/2009    Orientation RESPIRATION BLADDER Height & Weight     Self, Time, Situation, Place  Normal Continent Weight: 125 lb (56.7 kg) Height:  5' (152.4 cm)  BEHAVIORAL SYMPTOMS/MOOD NEUROLOGICAL BOWEL NUTRITION STATUS      Continent Diet (see discharge summary)  AMBULATORY  STATUS COMMUNICATION OF NEEDS Skin   Extensive Assist Verbally Other (Comment) (Wound / Incision (Open or Dehisced) 01/06/23 Arm Right;Lateral;Mid. Wound / Incision (Open or Dehisced) 01/07/23 Coccyx Anterior)                       Personal Care Assistance Level of Assistance  Bathing, Feeding, Dressing Bathing Assistance: Maximum assistance Feeding assistance: Limited assistance Dressing Assistance: Maximum assistance     Functional Limitations Info  Sight, Hearing, Speech Sight Info: Impaired Hearing Info: Adequate Speech Info: Adequate    SPECIAL CARE FACTORS FREQUENCY  PT (By licensed PT), OT (By licensed OT)     PT Frequency: 5x/week OT Frequency: 5x/week            Contractures Contractures Info: Not present    Additional Factors Info  Code Status, Allergies Code Status Info: FULL Allergies Info: Coreg (Carvedilol)  Lorazepam           Current Medications (01/08/2023):  This is the current hospital active medication list Current Facility-Administered Medications  Medication Dose Route Frequency Provider Last Rate Last Admin   acetaminophen (TYLENOL) tablet 650 mg  650 mg Oral Q6H PRN John Giovanni, MD   650 mg at 01/08/23 0150   Or   acetaminophen (TYLENOL) suppository 650 mg  650 mg Rectal Q6H PRN John Giovanni, MD       cefTRIAXone (ROCEPHIN) 2 g in sodium chloride 0.9 % 100 mL IVPB  2 g Intravenous Q24H John Giovanni, MD   Stopped at 01/07/23 2203   cholecalciferol (VITAMIN D3) 25 MCG (  1000 UNIT) tablet 1,000 Units  1,000 Units Oral Daily Azucena Fallen, MD       diclofenac Sodium (VOLTAREN) 1 % topical gel 2 g  2 g Topical QID Azucena Fallen, MD       ezetimibe (ZETIA) tablet 10 mg  10 mg Oral Daily John Giovanni, MD   10 mg at 01/08/23 1610   levothyroxine (SYNTHROID) tablet 25 mcg  25 mcg Oral Daily John Giovanni, MD   25 mcg at 01/08/23 0533   metroNIDAZOLE (FLAGYL) IVPB 500 mg  500 mg Intravenous Clementeen Hoof, MD 100 mL/hr at 01/08/23 0602 Infusion Verify at 01/08/23 0602   sodium chloride flush (NS) 0.9 % injection 10 mL  10 mL Intravenous Q12H John Giovanni, MD   10 mL at 01/08/23 0910   sulfaSALAzine (AZULFIDINE) tablet 500 mg  500 mg Oral Daily John Giovanni, MD   500 mg at 01/08/23 0909     Discharge Medications: Please see discharge summary for a list of discharge medications.  Relevant Imaging Results:  Relevant Lab Results:   Additional Information SSN: [id number]  Pamela Dunn, [place]

## 2023-01-08 NOTE — Evaluation (Signed)
Occupational Therapy Evaluation Patient Details Name: Pamela Dunn MRN: [id number] DOB: 11/26/1931 Today's Date: 01/08/2023   History of Present Illness Pt is 89 yo female who presents with weakness on 01/06/23. 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 PMH: RA, GERD, HTN, HLD, pacemaker, CKD3, CHF, LBBB   Clinical Impression   PTA, pt lives alone and typically Modified Independent for ADLs and mobility using a walker. Pt has aide assist 1x/wk for showers and family assist for community IADLs. Pt presents now with deficits in standing balance, strength, coordination and pain. Pt severely limited by impaired use of BUE (R UE d/t nonunion and entire arm splinted and LUE due to edema/severe pain). Overall, pt requires Min A for bed mobility, up to Mod A x 2 for mobility in room w/ support underarms. Pt requires Max-Total A for ADLs including self feeding d/t inability to reach to face currently. Pt's son interviewing for additional assistance for pt at home after postacute rehab stay.      If plan is discharge home, recommend the following: A lot of help with walking and/or transfers;A lot of help with bathing/dressing/bathroom    Functional Status Assessment  Patient has had a recent decline in their functional status and demonstrates the ability to make significant improvements in function in a reasonable and predictable amount of time.  Equipment Recommendations  Other (comment) (TBD pending progress)    Recommendations for Other Services       Precautions / Restrictions Precautions Precautions: Fall Restrictions Weight Bearing Restrictions: No RUE Weight Bearing: Non weight bearing (nonunion of ulna and elbow, but no formal WB orders) Other Position/Activity Restrictions: R UE splinted      Mobility Bed Mobility Overal bed mobility: Needs Assistance Bed Mobility: Supine to Sit, Sit to Supine     Supine to sit: Min assist, HOB elevated Sit to supine:  Min assist   General bed mobility comments: able to swing LE to EOB and lift trunk w/ Min A to scoot fully EOB. Min A for BLE back to bed    Transfers Overall transfer level: Needs assistance Equipment used: 2 person hand held assist, Ambulation equipment used Transfers: Sit to/from Stand Sit to Stand: Min assist, +2 physical assistance, +2 safety/equipment           General transfer comment: Min A x 2 to stand with Stedy with elevated bed to allow pt to reach LUE to Libby bar more comfortably. able to stand again with +2 assist under arms to mobilize in room      Balance Overall balance assessment: Needs assistance Sitting-balance support: No upper extremity supported, Feet supported Sitting balance-Leahy Scale: Fair     Standing balance support: Bilateral upper extremity supported, Single extremity supported, During functional activity Standing balance-Leahy Scale: Poor                             ADL either performed or assessed with clinical judgement   ADL Overall ADL's : Needs assistance/impaired Eating/Feeding: Total assistance;Bed level Eating/Feeding Details (indicate cue type and reason): reports son has been assisting with meals as R UE splinted w/ elbow flexed and unable to reach face and LUE swollen and painful - also unable to reach face Grooming: Total assistance;Bed level;Sitting Grooming Details (indicate cue type and reason): unable to reach face Upper Body Bathing: Maximal assistance;Sitting   Lower Body Bathing: Total assistance;Sit to/from stand   Upper Body Dressing :  Maximal assistance;Sitting   Lower Body Dressing: Total assistance;Sit to/from stand   Toilet Transfer: Moderate assistance;+2 for safety/equipment;Ambulation;+2 for physical assistance   Toileting- Clothing Manipulation and Hygiene: Total assistance;Sit to/from stand;Sitting/lateral lean       Functional mobility during ADLs: Moderate assistance;+2 for physical  assistance;+2 for safety/equipment General ADL Comments: able to move fairly well though limited by RUE restrictions and LUE pain so device use limited - required +2 assist under arms for mobility and unable to reach face/head for UB ADLs     Vision Baseline Vision/History: 1 Wears glasses Ability to See in Adequate Light: 0 Adequate Patient Visual Report: No change from baseline Vision Assessment?: No apparent visual deficits     Perception         Praxis         Pertinent Vitals/Pain Pain Assessment Pain Assessment: 0-10 Pain Score: 5  Pain Location: LUE Pain Descriptors / Indicators: Sore Pain Intervention(s): Monitored during session, Limited activity within patient's tolerance, Repositioned     Extremity/Trunk Assessment Upper Extremity Assessment Upper Extremity Assessment: Left hand dominant;RUE deficits/detail;LUE deficits/detail   Lower Extremity Assessment Lower Extremity Assessment: Defer to PT evaluation   Cervical / Trunk Assessment Cervical / Trunk Assessment: Kyphotic   Communication Communication Communication: No apparent difficulties   Cognition Arousal: Alert Behavior During Therapy: WFL for tasks assessed/performed Overall Cognitive Status: Within Functional Limits for tasks assessed                                       General Comments       Exercises     Shoulder Instructions      Home Living Family/patient expects to be discharged to:: Private residence Living Arrangements: Alone Available Help at Discharge: Family;Available 24 hours/day Type of Home: House Home Access: Ramped entrance     Home Layout: One level     Bathroom Shower/Tub: Producer, television/film/video: Handicapped height     Home Equipment: Agricultural consultant (2 wheels);Shower seat;Cane - single point;Grab bars - tub/shower;Rollator (4 wheels) (tray on wheels in kitchen; upright Rollator)          Prior Functioning/Environment Prior Level  of Function : Needs assist             Mobility Comments: ambulates with RW and furniture walks in the home. uses upright Rollator in the community ADLs Comments: caregiver comes and helps with shower 1x/wk and has a cleaner 2x/mo. Family looking into getting more care. pt typically able to manage ADLs, light meal prep, etc. Son assists with community tasks        OT Problem List: Decreased strength;Decreased activity tolerance;Decreased range of motion;Impaired balance (sitting and/or standing);Decreased coordination;Decreased knowledge of use of DME or AE;Impaired UE functional use;Pain;Increased edema      OT Treatment/Interventions: Self-care/ADL training;Therapeutic exercise;Energy conservation;DME and/or AE instruction;Therapeutic activities;Patient/family education;Balance training    OT Goals(Current goals can be found in the care plan section) Acute Rehab OT Goals Patient Stated Goal: resolve LUE pain to improve ADL independence OT Goal Formulation: With patient Time For Goal Achievement: 01/22/23 Potential to Achieve Goals: Good  OT Frequency: Min 1X/week    Co-evaluation PT/OT/SLP Co-Evaluation/Treatment: Yes Reason for Co-Treatment: Complexity of the patient's impairments (multi-system involvement);For patient/therapist safety;To address functional/ADL transfers   OT goals addressed during session: ADL's and self-care;Proper use of Adaptive equipment and DME      AM-PAC OT "  6 Clicks" Daily Activity     Outcome Measure Help from another person eating meals?: Total Help from another person taking care of personal grooming?: Total Help from another person toileting, which includes using toliet, bedpan, or urinal?: Total Help from another person bathing (including washing, rinsing, drying)?: A Lot Help from another person to put on and taking off regular upper body clothing?: A Lot Help from another person to put on and taking off regular lower body clothing?: Total 6  Click Score: 8   End of Session Equipment Utilized During Treatment: Gait belt  Activity Tolerance: Patient tolerated treatment well Patient left: in bed;with call bell/phone within reach;with bed alarm set  OT Visit Diagnosis: Unsteadiness on feet (R26.81);Other abnormalities of gait and mobility (R26.89);Pain Pain - Right/Left: Left Pain - part of body: Arm                Time: 0454-0981 OT Time Calculation (min): 35 min Charges:  OT General Charges $OT Visit: 1 Visit OT Evaluation $OT Eval Moderate Complexity: 1 Mod  Bradd Canary, OTR/L Acute Rehab Services Office: [phone]   Lorre Munroe 01/08/2023, 11:37 AM

## 2023-01-08 NOTE — Progress Notes (Signed)
PROGRESS NOTE    Pamela Dunn  VFI:[id number] DOB: [birth year] DOA: 01/06/2023 PCP: Irena Reichmann, DO   Brief Narrative:  Pamela Dunn is a 89 y.o. female with medical history significant of rheumatoid arthritis, LBBB, chronic combined CHF/nonischemic cardiomyopathy, hypertension, hyperlipidemia, CKD stage IIIa, complete heart block status post PPM, hypothyroidism, history of SBO status post small bowel resection in June 2021, GERD presented to the ED for evaluation of nausea, vomiting, generalized weakness, and hypotension.  Initial imaging at intake of the right elbow showed posterior displacement of the ulna with large 3.7 cm osseous fragment anterior to the humerus concerning for age-indeterminate fracture.  Hospitalist called for admission, Ortho called in consult.  Patient evaluated by orthopedic surgery, no indication for further imaging or procedure at this time, presumed changes on x-ray are secondary to inflammatory arthritis.  Patient otherwise medically stable for discharge however continues to have profound ambulatory dysfunction and weakness in the setting of recent GI illness and arm pain.  PT OT currently recommending placement at skilled nursing facility for ongoing therapy.  Assessment & Plan:   Principal Problem:   Colitis Active Problems:   Rheumatoid arthritis (HCC)   Chronic diastolic CHF (congestive heart failure) (HCC)   Hypoglycemia   Incidental pulmonary nodule   Fracture of upper arm  Suspected colitis/diverticulitis, resolving Rule out partial small bowel obstruction -CT concerning for partial small bowel obstruction, versus inflammation consistent with colitis/diverticulitis -Ceftriaxone/Flagyl ongoing, continue for additional 24 to 48 hours -Patient's GI symptoms appear to be resolving at this point -previously n.p.o. for possible surgery now tolerating p.o. quite well without any difficulty, denies nausea vomiting diarrhea constipation -Continue  diet -Continue supportive care/bowel regimen as needed  Posterior displacement of right ulna with large 3.7 cm osseous fragment noted on imaging, POA -Orthopedic surgery consulted given abnormal imaging, notably chronic changes from inflammatory arthritis per their evaluation, no indication for further imaging or procedure at this time -Pain currently well-controlled, wrapped and secured at this time with splint   Hypoglycemia -In the setting of poor p.o. intake/n.p.o. status -Taking p.o. well, IV fluids discontinued   Incidental pulmonary nodules - CT abdomen pelvis showing 6 mm and 5 mm right lung base nodules which appear stable since 09/04/2022 but new since 2021.  Follow-up outpatient with PCP for nonemergent outpatient chest CT to evaluate for other nodules.   Chronic CHF/nonischemic cardiomyopathy - Echo done in April 2023 showing EF 57%, grade 1 diastolic dysfunction, moderate tricuspid regurgitation. No indication for repeat echo at this time, patient euvolemic and asymptomatic   Hyperlipidemia - Continue Zetia   Hypothyroidism Continue Synthroid   Rheumatoid arthritis Continue prednisone and sulfasalazine   Hypertension Hold antihypertensives at this time and monitor blood pressure closely.  DVT prophylaxis: SCDs Start: 01/07/23 4166   Code Status:   Code Status: Full Code Family Communication: At bedside  Status is: Inpatient  Dispo: The patient is from: Home              Anticipated d/c is to: SNF              Anticipated d/c date is: Imminent              Patient currently is medically stable for discharge -now cleared by orthopedics   Consultants:  Orthopedic surgery  Procedures:  None  Antimicrobials:  Ceftriaxone/Flagyl  Subjective: No acute issues or events overnight, poor ambulatory status with PT today recommending SNF.  Patient denies nausea vomiting diarrhea constipation headache fevers chills  or chest pain  Objective: Vitals:   01/07/23  0420 01/07/23 0811 01/07/23 1700 01/07/23 2017  BP: (!) 116/41 (!) 131/46 (!) 133/48 (!) 134/46  Pulse: 73 72 73 82  Resp: 18 18 18 18   Temp: 98.8 F (37.1 C) 97.7 F (36.5 C)  98.2 F (36.8 C)  TempSrc:  Oral  Oral  SpO2: 100% 99% 99% 97%  Weight:      Height:        Intake/Output Summary (Last 24 hours) at 01/08/2023 1247 Last data filed at 01/08/2023 1006 Gross per 24 hour  Intake 1145.65 ml  Output --  Net 1145.65 ml   Filed Weights   01/06/23 1516  Weight: 56.7 kg    Examination:  General:  Pleasantly resting in bed, No acute distress. HEENT:  Normocephalic atraumatic.  Sclerae nonicteric, noninjected.  Extraocular movements intact bilaterally. Neck:  Without mass or deformity.  Trachea is midline. Lungs:  Clear to auscultate bilaterally without rhonchi, wheeze, or rales. Heart:  Regular rate and rhythm.  Without murmurs, rubs, or gallops. Abdomen:  Soft, nontender, nondistended.  Without guarding or rebound. Extremities: Right arm bandage/brace clean dry intact  Data Reviewed: I have personally reviewed following labs and imaging studies  CBC: Recent Labs  Lab 01/06/23 1543 01/07/23 0738  WBC 15.5* 15.6*  NEUTROABS 13.4*  --   HGB 11.7* 11.2*  HCT 36.7 33.0*  MCV 94.6 92.4  PLT 281 241   Basic Metabolic Panel: Recent Labs  Lab 01/06/23 1543 01/07/23 0738  NA 139 136  K 3.9 4.0  CL 102 102  CO2 26 23  GLUCOSE 65* 74  BUN 22 23  CREATININE 1.31* 1.12*  CALCIUM 8.4* 8.2*   GFR: Estimated Creatinine Clearance: 26.9 mL/min (A) (by C-G formula based on SCr of 1.12 mg/dL (H)). Liver Function Tests: Recent Labs  Lab 01/06/23 1543  AST 27  ALT 9  ALKPHOS 42  BILITOT 1.0  PROT 5.0*  ALBUMIN 2.7*   Coagulation Profile: Recent Labs  Lab 01/06/23 1543  INR 1.1   CBG: Recent Labs  Lab 01/07/23 0411 01/07/23 0807 01/07/23 1701 01/07/23 2020 01/08/23 0417  GLUCAP 72 65* 118* 119* 89   Sepsis Labs: Recent Labs  Lab 01/06/23 1629  01/06/23 1742  LATICACIDVEN 4.2* 2.1*    Recent Results (from the past 240 hour(s))  Resp panel by RT-PCR (RSV, Flu A&B, Covid)     Status: None   Collection Time: 01/06/23  3:43 PM   Specimen: Nasal Swab  Result Value Ref Range Status   SARS Coronavirus 2 by RT PCR NEGATIVE NEGATIVE Final   Influenza A by PCR NEGATIVE NEGATIVE Final   Influenza B by PCR NEGATIVE NEGATIVE Final    Comment: (NOTE) The Xpert Xpress SARS-CoV-2/FLU/RSV plus assay is intended as an aid in the diagnosis of influenza from Nasopharyngeal swab specimens and should not be used as a sole basis for treatment. Nasal washings and aspirates are unacceptable for Xpert Xpress SARS-CoV-2/FLU/RSV testing.  Fact Sheet for Patients: BloggerCourse.com  Fact Sheet for Healthcare Providers: SeriousBroker.it  This test is not yet approved or cleared by the [place] FDA and has been authorized for detection and/or diagnosis of SARS-CoV-2 by FDA under an Emergency Use Authorization (EUA). This EUA will remain in effect (meaning this test can be used) for the duration of the COVID-19 declaration under Section 564(b)(1) of the Act, 21 U.S.C. section 360bbb-3(b)(1), unless the authorization is terminated or revoked.     Resp Syncytial Virus  by PCR NEGATIVE NEGATIVE Final    Comment: (NOTE) Fact Sheet for Patients: BloggerCourse.com  Fact Sheet for Healthcare Providers: SeriousBroker.it  This test is not yet approved or cleared by the [place] FDA and has been authorized for detection and/or diagnosis of SARS-CoV-2 by FDA under an Emergency Use Authorization (EUA). This EUA will remain in effect (meaning this test can be used) for the duration of the COVID-19 declaration under Section 564(b)(1) of the Act, 21 U.S.C. section 360bbb-3(b)(1), unless the authorization is terminated or revoked.  Performed at  [facility], [address] [place], [place] [address]   Blood Culture (routine x 2)     Status: None (Preliminary result)   Collection Time: 01/06/23  3:43 PM   Specimen: BLOOD LEFT FOREARM  Result Value Ref Range Status   Specimen Description BLOOD LEFT FOREARM  Final   Special Requests   Final    BOTTLES DRAWN AEROBIC AND ANAEROBIC Blood Culture adequate volume   Culture   Final    NO GROWTH 2 DAYS Performed at [facility], [address], [place], [place] [address]    Report Status PENDING  Incomplete  Blood Culture (routine x 2)     Status: None (Preliminary result)   Collection Time: 01/06/23  3:55 PM   Specimen: BLOOD  Result Value Ref Range Status   Specimen Description BLOOD RIGHT ANTECUBITAL  Final   Special Requests   Final    BOTTLES DRAWN AEROBIC AND ANAEROBIC Blood Culture adequate volume   Culture   Final    NO GROWTH 2 DAYS Performed at [facility], [address] [place], [place] [address]    Report Status PENDING  Incomplete         Radiology Studies: CT ABDOMEN PELVIS WO CONTRAST  Result Date: 01/06/2023 CLINICAL DATA:  Hypotension, nausea, and suspected small-bowel obstruction. Previous history of small-bowel obstruction. Questionable sepsis. EXAM: CT ABDOMEN AND PELVIS WITHOUT CONTRAST TECHNIQUE: Multidetector CT imaging of the abdomen and pelvis was performed following the standard protocol without IV contrast. RADIATION DOSE REDUCTION: This exam was performed according to the departmental dose-optimization program which includes automated exposure control, adjustment of the mA and/or kV according to patient size and/or use of iterative reconstruction technique. COMPARISON:  CTs with IV contrast 09/04/2022 and 08/23/2019. FINDINGS: Lower chest: There are trace pleural effusions. Subpleural linear atelectasis in the lower lobes. Mild chronic elevation right hemidiaphragm. New from 2021 but stable since 09/04/2022, there is a 6 mm right  middle lobe noncalcified nodule on 4:2, a 5 mm stable noncalcified right lower lobe anterior basal segment nodule on 4:9, and adjacent 2 mm subpleural right middle lobe nodules on 4:7. Four months of stability is confirmed but there could be other nodules above the plane of current and prior imaging. Chest CT is recommended to assess for additional nodules. No focal consolidation is seen. There is mild diffuse bronchial thickening. Mild cardiomegaly is unchanged. No pericardial effusion. There is metal artifact from pacemaker wiring in the right heart. Coronary artery calcifications. Small hiatal hernia. Hepatobiliary: No liver masses seen without contrast. There are occasional calcified granulomas. There are they few small stones layering in the proximal gallbladder but no wall thickening no biliary dilatation. Pancreas: Partially atrophic. Otherwise unremarkable without contrast. Spleen: No abnormality is seen without contrast. Adrenals/Urinary Tract: There is no adrenal mass. Bilateral renal cortical thinning is again noted. A 1.7 cm Bosniak 1 cyst is again seen in the midpole left kidney  anteriorly, Hounsfield density is 2. No follow-up imaging is recommended. No other contour deforming deformity abnormality is seen of the kidneys. There is no urinary stone or obstruction. The bladder is unremarkable for the degree of distention. Stomach/Bowel: Interval increased thickening at the GE junction warranting endoscopic follow-up. Rest of the gastric wall is contracted. There is a mildly dilated postsurgical small bowel segment in the midline to right paramidline lower abdomen, measuring 3.6 cm caliber. Remainder of the small bowel is normal caliber. This could be due to a low-grade partial small bowel obstruction along the surgical segment or possibly due to ileus. An appendix is not seen in this patient. There is either wall thickening or underdistention in the proximal transverse colon to the midline, with colitis  favored as there are hazy reactive mesenteric inflammatory changes in the area. There is mild fecal stasis in the distal transverse, descending and rectosigmoid colon, without evidence of colitis. There is uncomplicated sigmoid diverticulitis. A moderate-sized stool ball distends the rectum but there is no wall thickening. Vascular/Lymphatic: Extensive aortoiliac and visceral abdominal arterial calcific plaques. No AAA. No adenopathy is seen. Reproductive: Uterus and bilateral adnexa are unremarkable. Other: Small umbilical fat hernia. No incarcerated hernia. Trace ascites in the posterior pelvis and subhepatic space. No drainable pocket is seen. There is no free air, free hemorrhage or localizing collection. Musculoskeletal: Osteopenia and advanced degenerative change lower thoracic and lumbar spine. Bone-on-bone superior joint space loss of both hips with acetabular osteophytes. Acquired spinal stenosis again noted L1-2, L2-3, and L3-4 with multilevel lumbar foraminal stenosis. No suspicious regional osseous lesions. IMPRESSION: 1. Mildly dilated postsurgical small bowel segment in the midline to right paramidline lower abdomen, measuring 3.6 cm caliber. This could be due to a low-grade partial small bowel obstruction along the surgical segment or possibly due to ileus. 2. Proximal to mid transverse colon wall thickening or underdistention, with colitis favored. 3. Constipation and uncomplicated sigmoid diverticulitis. 4. Trace ascites. No drainable pocket. 5. Trace pleural effusions with bibasilar atelectasis. 6. Cholelithiasis. 7. Aortic and coronary artery atherosclerosis. 8. Osteopenia and advanced degenerative change. 9. 6 mm and 5 mm right lung base nodules are stable since 09/04/2022 but new since 2021. Nonemergent chest CT is recommended to look for other nodules. Aortic Atherosclerosis (ICD10-I70.0). Electronically Signed   By: Almira Bar M.D.   On: 01/06/2023 21:03   DG Elbow Complete  Left  Result Date: 01/06/2023 CLINICAL DATA:  Hypotension. Possible fall. Large skin tear to right forearm. Unclear etiology. Unable to right elbow. EXAM: LEFT ELBOW - COMPLETE 3+ VIEW; RIGHT ELBOW - COMPLETE 3+ VIEW COMPARISON:  Radiographs 01/06/2009 FINDINGS: Right: Severe erosive arthritic changes are redemonstrated. There is posterior displacement of the ulna in relation to the humerus. Large 3.7 cm osseous fragment anterior to the humerus compatible with age-indeterminate fracture fragment. Left: Severe erosive changes. Plate and screw fixation distal humerus. No definite acute fracture. No dislocation. IMPRESSION: 1. Posterior displacement of the right ulna in relation to the humerus. 2. Large 3.7 cm osseous fragment anterior to the right humerus compatible with age-indeterminate fracture fragment. 3. Severe erosive arthritic changes bilaterally. Electronically Signed   By: Minerva Fester M.D.   On: 01/06/2023 20:08   DG Elbow Complete Right  Result Date: 01/06/2023 CLINICAL DATA:  Hypotension. Possible fall. Large skin tear to right forearm. Unclear etiology. Unable to right elbow. EXAM: LEFT ELBOW - COMPLETE 3+ VIEW; RIGHT ELBOW - COMPLETE 3+ VIEW COMPARISON:  Radiographs 01/06/2009 FINDINGS: Right: Severe erosive arthritic changes are  redemonstrated. There is posterior displacement of the ulna in relation to the humerus. Large 3.7 cm osseous fragment anterior to the humerus compatible with age-indeterminate fracture fragment. Left: Severe erosive changes. Plate and screw fixation distal humerus. No definite acute fracture. No dislocation. IMPRESSION: 1. Posterior displacement of the right ulna in relation to the humerus. 2. Large 3.7 cm osseous fragment anterior to the right humerus compatible with age-indeterminate fracture fragment. 3. Severe erosive arthritic changes bilaterally. Electronically Signed   By: Minerva Fester M.D.   On: 01/06/2023 20:08   DG Chest Port 1 View  Result Date:  01/06/2023 CLINICAL DATA:  Questionable sepsis-evaluate for abnormality, nausea, vomiting, weakness, hypotension EXAM: PORTABLE CHEST 1 VIEW COMPARISON:  03/04/2022 FINDINGS: Stable cardiomegaly. Aortic atherosclerotic calcification. Left midlung scarring. Left basilar atelectasis. No focal consolidation, pleural effusion, or pneumothorax. No displaced rib fractures. Left chest wall pacemaker. Left TSA. IMPRESSION: No active disease. Electronically Signed   By: Minerva Fester M.D.   On: 01/06/2023 20:03   CT Head Wo Contrast  Result Date: 01/06/2023 CLINICAL DATA:  Head trauma EXAM: CT HEAD WITHOUT CONTRAST TECHNIQUE: Contiguous axial images were obtained from the base of the skull through the vertex without intravenous contrast. RADIATION DOSE REDUCTION: This exam was performed according to the departmental dose-optimization program which includes automated exposure control, adjustment of the mA and/or kV according to patient size and/or use of iterative reconstruction technique. COMPARISON:  None Available. FINDINGS: Brain: There is no mass, hemorrhage or extra-axial collection. There is generalized atrophy without lobar predilection. Hypodensity of the white matter is most commonly associated with chronic microvascular disease. Old anterior right MCA territory infarct. Vascular: Atherosclerotic calcification of the vertebral and internal carotid arteries at the skull base. No abnormal hyperdensity of the major intracranial arteries or dural venous sinuses. Skull: The visualized skull base, calvarium and extracranial soft tissues are normal. Sinuses/Orbits: No fluid levels or advanced mucosal thickening of the visualized paranasal sinuses. No mastoid or middle ear effusion. Normal orbits. IMPRESSION: 1. No acute intracranial abnormality. 2. Old anterior right MCA territory infarct and findings of chronic microvascular disease. Electronically Signed   By: Deatra Robinson M.D.   On: 01/06/2023 19:08     Scheduled Meds:  ezetimibe  10 mg Oral Daily   levothyroxine  25 mcg Oral Daily   predniSONE  5 mg Oral Daily   sodium chloride flush  10 mL Intravenous Q12H   sulfaSALAzine  500 mg Oral Daily   Continuous Infusions:  cefTRIAXone (ROCEPHIN)  IV Stopped (01/07/23 2203)   metronidazole 100 mL/hr at 01/08/23 0602     LOS: 1 day   Time spent:  Azucena Fallen, DO [facility]  If 7PM-7AM, please contact night-coverage [email or web address]  01/08/2023, 12:47 PM

## 2023-01-09 DIAGNOSIS — K529 Noninfective gastroenteritis and colitis, unspecified: Secondary | ICD-10-CM | POA: Diagnosis not present

## 2023-01-09 LAB — GLUCOSE, CAPILLARY: Glucose-Capillary: 109 mg/dL — ABNORMAL HIGH (ref 70–99)

## 2023-01-09 MED ORDER — ISOSORB DINITRATE-HYDRALAZINE 20-37.5 MG PO TABS
1.0000 | ORAL_TABLET | Freq: Two times a day (BID) | ORAL | Status: DC
  Administered 2023-01-09 – 2023-01-12 (×7): 1 via ORAL
  Filled 2023-01-09 (×7): qty 1

## 2023-01-09 MED ORDER — DICLOFENAC SODIUM 1 % EX GEL: 2.0000 g | Freq: Four times a day (QID) | CUTANEOUS | 0 refills | Status: DC

## 2023-01-09 NOTE — Plan of Care (Signed)

## 2023-01-09 NOTE — Discharge Summary (Signed)
Physician Discharge Summary  Pamela Dunn:[id number] DOB: [birth year] DOA: 01/06/2023  PCP: Irena Reichmann, DO  Admit date: 01/06/2023 Discharge date: 01/09/2023  Admitted From: Home Disposition: SNF  Recommendations for Outpatient Follow-up:  Follow up with PCP in 1-2 weeks Follow-up with rheumatology as scheduled in the next 1 to 2 weeks Follow-up with orthopedic surgery in 1 week  Discharge Condition: Stable CODE STATUS: Full Diet recommendation: As tolerated  Brief/Interim Summary: Pamela Dunn is a 89 y.o. female with medical history significant of rheumatoid arthritis, LBBB, chronic combined CHF/nonischemic cardiomyopathy, hypertension, hyperlipidemia, CKD stage IIIa, complete heart block status post PPM, hypothyroidism, history of SBO status post small bowel resection in June 2021, GERD presented to the ED for evaluation of nausea, vomiting, generalized weakness, and hypotension.  Initial imaging at intake of the right elbow showed posterior displacement of the ulna with large 3.7 cm osseous fragment anterior to the humerus concerning for age-indeterminate fracture.  Hospitalist called for admission, Ortho called in consult.   Patient evaluated by orthopedic surgery, no indication for further imaging or procedure at this time, presumed changes on x-ray are secondary to inflammatory arthritis.  Patient otherwise medically stable for discharge however continues to have profound ambulatory dysfunction and weakness in the setting of recent GI illness and arm pain.  PT OT currently recommending placement at skilled nursing facility for ongoing therapy.  Patient remains medically stable for discharge, awaiting safe disposition at skilled nursing facility.  Patient has been offered a bed and has insurance approval today 01/09/2023 but bed is apparently not available until Monday, 01/12/2023  Discharge Diagnoses:  Principal Problem:   Colitis Active Problems:   Rheumatoid arthritis  (HCC)   Chronic diastolic CHF (congestive heart failure) (HCC)   Hypoglycemia   Incidental pulmonary nodule   Fracture of upper arm   Suspected colitis/diverticulitis, resolving Rule out partial small bowel obstruction -CT concerning for partial small bowel obstruction, versus inflammation consistent with colitis/diverticulitis -Ceftriaxone/Flagyl completed, discontinue -Patient's GI symptoms resolved rapidly, continue to advance diet as tolerated-Continue supportive care/bowel regimen as needed   Advanced rheumatoid arthritis, intractable diffuse joint pain Posterior displacement of right ulna/osseous fragment noted -Orthopedic surgery consulted given abnormal imaging, notably chronic changes from inflammatory arthritis per their evaluation, no indication for further imaging or procedure at this time -Pain currently well-controlled, wrapped and secured at this time with splint -Follow-up in 1 week with orthopedic surgery as discussed; follow-up with rheumatology as scheduled   Hypoglycemia -In the setting of poor p.o. intake/n.p.o. status -Taking p.o. well, IV fluids discontinued   Incidental pulmonary nodules - CT abdomen pelvis showing 6 mm and 5 mm right lung base nodules which appear stable since 09/04/2022 but new since 2021.  Follow-up outpatient with PCP for nonemergent outpatient chest CT to evaluate for other nodules.   Chronic CHF/nonischemic cardiomyopathy - Echo done in April 2023 showing EF 57%, grade 1 diastolic dysfunction, moderate tricuspid regurgitation. No indication for repeat echo at this time, patient euvolemic and asymptomatic   Hyperlipidemia - Continue Zetia   Hypothyroidism Continue Synthroid   Rheumatoid arthritis Continue prednisone and sulfasalazine   Hypertension Hold antihypertensives at this time and monitor blood pressure closely.   Discharge Instructions  Discharge Instructions     Discharge patient   Complete by: As directed     Discharge disposition: 03-Skilled Nursing Facility   Discharge patient date: 01/09/2023      Allergies as of 01/09/2023       Reactions   Coreg [carvedilol] Diarrhea  Lorazepam Diarrhea        Medication List     TAKE these medications    Aspirin-Caffeine 500-32.5 MG Tabs Take 1 tablet by mouth in the morning and at bedtime. [employer]   cholecalciferol 1000 units tablet Commonly known as: VITAMIN D Take 1,000 Units by mouth daily.   CoQ10 200 MG Caps Take 200 mg by mouth daily.   diclofenac Sodium 1 % Gel Commonly known as: VOLTAREN Apply 2 g topically 4 (four) times daily.   ezetimibe 10 MG tablet Commonly known as: ZETIA Take 10 mg by mouth daily.   isosorbide-hydrALAZINE 20-37.5 MG tablet Commonly known as: BIDIL TAKE 1 TABLET THREE TIMES DAILY What changed: when to take this   levothyroxine 25 MCG tablet Commonly known as: SYNTHROID Take 25 mcg by mouth daily.   metoprolol succinate 25 MG 24 hr tablet Commonly known as: TOPROL-XL Take 1 tablet (25 mg total) by mouth daily.   OMEGA-3 FISH OIL PO Take 1 capsule by mouth in the morning and at bedtime.   predniSONE 5 MG tablet Commonly known as: DELTASONE Take 5 mg by mouth daily.   sulfaSALAzine 500 MG tablet Commonly known as: AZULFIDINE Take 500 mg by mouth daily.        Allergies  Allergen Reactions   Coreg [Carvedilol] Diarrhea   Lorazepam Diarrhea    Consultations: Orthopedic surgery  Procedures/Studies: CT ABDOMEN PELVIS WO CONTRAST  Result Date: 01/06/2023 CLINICAL DATA:  Hypotension, nausea, and suspected small-bowel obstruction. Previous history of small-bowel obstruction. Questionable sepsis. EXAM: CT ABDOMEN AND PELVIS WITHOUT CONTRAST TECHNIQUE: Multidetector CT imaging of the abdomen and pelvis was performed following the standard protocol without IV contrast. RADIATION DOSE REDUCTION: This exam was performed according to the departmental dose-optimization program  which includes automated exposure control, adjustment of the mA and/or kV according to patient size and/or use of iterative reconstruction technique. COMPARISON:  CTs with IV contrast 09/04/2022 and 08/23/2019. FINDINGS: Lower chest: There are trace pleural effusions. Subpleural linear atelectasis in the lower lobes. Mild chronic elevation right hemidiaphragm. New from 2021 but stable since 09/04/2022, there is a 6 mm right middle lobe noncalcified nodule on 4:2, a 5 mm stable noncalcified right lower lobe anterior basal segment nodule on 4:9, and adjacent 2 mm subpleural right middle lobe nodules on 4:7. Four months of stability is confirmed but there could be other nodules above the plane of current and prior imaging. Chest CT is recommended to assess for additional nodules. No focal consolidation is seen. There is mild diffuse bronchial thickening. Mild cardiomegaly is unchanged. No pericardial effusion. There is metal artifact from pacemaker wiring in the right heart. Coronary artery calcifications. Small hiatal hernia. Hepatobiliary: No liver masses seen without contrast. There are occasional calcified granulomas. There are they few small stones layering in the proximal gallbladder but no wall thickening no biliary dilatation. Pancreas: Partially atrophic. Otherwise unremarkable without contrast. Spleen: No abnormality is seen without contrast. Adrenals/Urinary Tract: There is no adrenal mass. Bilateral renal cortical thinning is again noted. A 1.7 cm Bosniak 1 cyst is again seen in the midpole left kidney anteriorly, Hounsfield density is 2. No follow-up imaging is recommended. No other contour deforming deformity abnormality is seen of the kidneys. There is no urinary stone or obstruction. The bladder is unremarkable for the degree of distention. Stomach/Bowel: Interval increased thickening at the GE junction warranting endoscopic follow-up. Rest of the gastric wall is contracted. There is a mildly dilated  postsurgical small bowel segment  in the midline to right paramidline lower abdomen, measuring 3.6 cm caliber. Remainder of the small bowel is normal caliber. This could be due to a low-grade partial small bowel obstruction along the surgical segment or possibly due to ileus. An appendix is not seen in this patient. There is either wall thickening or underdistention in the proximal transverse colon to the midline, with colitis favored as there are hazy reactive mesenteric inflammatory changes in the area. There is mild fecal stasis in the distal transverse, descending and rectosigmoid colon, without evidence of colitis. There is uncomplicated sigmoid diverticulitis. A moderate-sized stool ball distends the rectum but there is no wall thickening. Vascular/Lymphatic: Extensive aortoiliac and visceral abdominal arterial calcific plaques. No AAA. No adenopathy is seen. Reproductive: Uterus and bilateral adnexa are unremarkable. Other: Small umbilical fat hernia. No incarcerated hernia. Trace ascites in the posterior pelvis and subhepatic space. No drainable pocket is seen. There is no free air, free hemorrhage or localizing collection. Musculoskeletal: Osteopenia and advanced degenerative change lower thoracic and lumbar spine. Bone-on-bone superior joint space loss of both hips with acetabular osteophytes. Acquired spinal stenosis again noted L1-2, L2-3, and L3-4 with multilevel lumbar foraminal stenosis. No suspicious regional osseous lesions. IMPRESSION: 1. Mildly dilated postsurgical small bowel segment in the midline to right paramidline lower abdomen, measuring 3.6 cm caliber. This could be due to a low-grade partial small bowel obstruction along the surgical segment or possibly due to ileus. 2. Proximal to mid transverse colon wall thickening or underdistention, with colitis favored. 3. Constipation and uncomplicated sigmoid diverticulitis. 4. Trace ascites. No drainable pocket. 5. Trace pleural effusions with  bibasilar atelectasis. 6. Cholelithiasis. 7. Aortic and coronary artery atherosclerosis. 8. Osteopenia and advanced degenerative change. 9. 6 mm and 5 mm right lung base nodules are stable since 09/04/2022 but new since 2021. Nonemergent chest CT is recommended to look for other nodules. Aortic Atherosclerosis (ICD10-I70.0). Electronically Signed   By: Almira Bar M.D.   On: 01/06/2023 21:03   DG Elbow Complete Left  Result Date: 01/06/2023 CLINICAL DATA:  Hypotension. Possible fall. Large skin tear to right forearm. Unclear etiology. Unable to right elbow. EXAM: LEFT ELBOW - COMPLETE 3+ VIEW; RIGHT ELBOW - COMPLETE 3+ VIEW COMPARISON:  Radiographs 01/06/2009 FINDINGS: Right: Severe erosive arthritic changes are redemonstrated. There is posterior displacement of the ulna in relation to the humerus. Large 3.7 cm osseous fragment anterior to the humerus compatible with age-indeterminate fracture fragment. Left: Severe erosive changes. Plate and screw fixation distal humerus. No definite acute fracture. No dislocation. IMPRESSION: 1. Posterior displacement of the right ulna in relation to the humerus. 2. Large 3.7 cm osseous fragment anterior to the right humerus compatible with age-indeterminate fracture fragment. 3. Severe erosive arthritic changes bilaterally. Electronically Signed   By: Minerva Fester M.D.   On: 01/06/2023 20:08   DG Elbow Complete Right  Result Date: 01/06/2023 CLINICAL DATA:  Hypotension. Possible fall. Large skin tear to right forearm. Unclear etiology. Unable to right elbow. EXAM: LEFT ELBOW - COMPLETE 3+ VIEW; RIGHT ELBOW - COMPLETE 3+ VIEW COMPARISON:  Radiographs 01/06/2009 FINDINGS: Right: Severe erosive arthritic changes are redemonstrated. There is posterior displacement of the ulna in relation to the humerus. Large 3.7 cm osseous fragment anterior to the humerus compatible with age-indeterminate fracture fragment. Left: Severe erosive changes. Plate and screw fixation  distal humerus. No definite acute fracture. No dislocation. IMPRESSION: 1. Posterior displacement of the right ulna in relation to the humerus. 2. Large 3.7 cm osseous fragment anterior to  the right humerus compatible with age-indeterminate fracture fragment. 3. Severe erosive arthritic changes bilaterally. Electronically Signed   By: Minerva Fester M.D.   On: 01/06/2023 20:08   DG Chest Port 1 View  Result Date: 01/06/2023 CLINICAL DATA:  Questionable sepsis-evaluate for abnormality, nausea, vomiting, weakness, hypotension EXAM: PORTABLE CHEST 1 VIEW COMPARISON:  03/04/2022 FINDINGS: Stable cardiomegaly. Aortic atherosclerotic calcification. Left midlung scarring. Left basilar atelectasis. No focal consolidation, pleural effusion, or pneumothorax. No displaced rib fractures. Left chest wall pacemaker. Left TSA. IMPRESSION: No active disease. Electronically Signed   By: Minerva Fester M.D.   On: 01/06/2023 20:03   CT Head Wo Contrast  Result Date: 01/06/2023 CLINICAL DATA:  Head trauma EXAM: CT HEAD WITHOUT CONTRAST TECHNIQUE: Contiguous axial images were obtained from the base of the skull through the vertex without intravenous contrast. RADIATION DOSE REDUCTION: This exam was performed according to the departmental dose-optimization program which includes automated exposure control, adjustment of the mA and/or kV according to patient size and/or use of iterative reconstruction technique. COMPARISON:  None Available. FINDINGS: Brain: There is no mass, hemorrhage or extra-axial collection. There is generalized atrophy without lobar predilection. Hypodensity of the white matter is most commonly associated with chronic microvascular disease. Old anterior right MCA territory infarct. Vascular: Atherosclerotic calcification of the vertebral and internal carotid arteries at the skull base. No abnormal hyperdensity of the major intracranial arteries or dural venous sinuses. Skull: The visualized skull base,  calvarium and extracranial soft tissues are normal. Sinuses/Orbits: No fluid levels or advanced mucosal thickening of the visualized paranasal sinuses. No mastoid or middle ear effusion. Normal orbits. IMPRESSION: 1. No acute intracranial abnormality. 2. Old anterior right MCA territory infarct and findings of chronic microvascular disease. Electronically Signed   By: Deatra Robinson M.D.   On: 01/06/2023 19:08     Subjective: No acute issues or events overnight   Discharge Exam: Vitals:   01/09/23 0752 01/09/23 1218  BP: (!) 158/68 (!) 153/69  Pulse: 81 88  Resp: 17   Temp: 97.6 F (36.4 C)   SpO2: 98%    Vitals:   01/07/23 1700 01/07/23 2017 01/09/23 0752 01/09/23 1218  BP: (!) 133/48 (!) 134/46 (!) 158/68 (!) 153/69  Pulse: 73 82 81 88  Resp: 18 18 17    Temp:   97.6 F (36.4 C)   TempSrc:  Oral Oral   SpO2: 99% 97% 98%   Weight:      Height:        General: Pt is alert, awake, not in acute distress Cardiovascular: RRR, S1/S2 +, no rubs, no gallops Respiratory: CTA bilaterally, no wheezing, no rhonchi Abdominal: Soft, NT, ND, bowel sounds + Extremities: Right arm below bandage clean dry intact    The results of significant diagnostics from this hospitalization (including imaging, microbiology, ancillary and laboratory) are listed below for reference.     Microbiology: Recent Results (from the past 240 hour(s))  Resp panel by RT-PCR (RSV, Flu A&B, Covid)     Status: None   Collection Time: 01/06/23  3:43 PM   Specimen: Nasal Swab  Result Value Ref Range Status   SARS Coronavirus 2 by RT PCR NEGATIVE NEGATIVE Final   Influenza A by PCR NEGATIVE NEGATIVE Final   Influenza B by PCR NEGATIVE NEGATIVE Final    Comment: (NOTE) The Xpert Xpress SARS-CoV-2/FLU/RSV plus assay is intended as an aid in the diagnosis of influenza from Nasopharyngeal swab specimens and should not be used as a sole basis for treatment.  Nasal washings and aspirates are unacceptable for Xpert  Xpress SARS-CoV-2/FLU/RSV testing.  Fact Sheet for Patients: BloggerCourse.com  Fact Sheet for Healthcare Providers: SeriousBroker.it  This test is not yet approved or cleared by the [place] FDA and has been authorized for detection and/or diagnosis of SARS-CoV-2 by FDA under an Emergency Use Authorization (EUA). This EUA will remain in effect (meaning this test can be used) for the duration of the COVID-19 declaration under Section 564(b)(1) of the Act, 21 U.S.C. section 360bbb-3(b)(1), unless the authorization is terminated or revoked.     Resp Syncytial Virus by PCR NEGATIVE NEGATIVE Final    Comment: (NOTE) Fact Sheet for Patients: BloggerCourse.com  Fact Sheet for Healthcare Providers: SeriousBroker.it  This test is not yet approved or cleared by the [place] FDA and has been authorized for detection and/or diagnosis of SARS-CoV-2 by FDA under an Emergency Use Authorization (EUA). This EUA will remain in effect (meaning this test can be used) for the duration of the COVID-19 declaration under Section 564(b)(1) of the Act, 21 U.S.C. section 360bbb-3(b)(1), unless the authorization is terminated or revoked.  Performed at [facility], [address], [place], [place] [address]   Blood Culture (routine x 2)     Status: None (Preliminary result)   Collection Time: 01/06/23  3:43 PM   Specimen: BLOOD LEFT FOREARM  Result Value Ref Range Status   Specimen Description BLOOD LEFT FOREARM  Final   Special Requests   Final    BOTTLES DRAWN AEROBIC AND ANAEROBIC Blood Culture adequate volume   Culture   Final    NO GROWTH 3 DAYS Performed at [facility], [address] [place], [place] [address]    Report Status PENDING  Incomplete  Blood Culture (routine x 2)     Status: None (Preliminary result)   Collection Time: 01/06/23  3:55 PM   Specimen: BLOOD   Result Value Ref Range Status   Specimen Description BLOOD RIGHT ANTECUBITAL  Final   Special Requests   Final    BOTTLES DRAWN AEROBIC AND ANAEROBIC Blood Culture adequate volume   Culture   Final    NO GROWTH 3 DAYS Performed at [facility], [address] [place], [place] [address]    Report Status PENDING  Incomplete     Labs: BNP (last 3 results) Recent Labs    01/07/23 0738  BNP 385.1*   Basic Metabolic Panel: Recent Labs  Lab 01/06/23 1543 01/07/23 0738  NA 139 136  K 3.9 4.0  CL 102 102  CO2 26 23  GLUCOSE 65* 74  BUN 22 23  CREATININE 1.31* 1.12*  CALCIUM 8.4* 8.2*   Liver Function Tests: Recent Labs  Lab 01/06/23 1543  AST 27  ALT 9  ALKPHOS 42  BILITOT 1.0  PROT 5.0*  ALBUMIN 2.7*   No results for input(s): "LIPASE", "AMYLASE" in the last 168 hours. No results for input(s): "AMMONIA" in the last 168 hours. CBC: Recent Labs  Lab 01/06/23 1543 01/07/23 0738  WBC 15.5* 15.6*  NEUTROABS 13.4*  --   HGB 11.7* 11.2*  HCT 36.7 33.0*  MCV 94.6 92.4  PLT 281 241   Cardiac Enzymes: No results for input(s): "CKTOTAL", "CKMB", "CKMBINDEX", "TROPONINI" in the last 168 hours. BNP: Invalid input(s): "POCBNP" CBG: Recent Labs  Lab 01/07/23 0807 01/07/23 1701 01/07/23 2020 01/08/23 0417 01/09/23 0750  GLUCAP 65* 118* 119* 89 109*   D-Dimer No results for input(s): "DDIMER" in the  last 72 hours. Hgb A1c No results for input(s): "HGBA1C" in the last 72 hours. Lipid Profile No results for input(s): "CHOL", "HDL", "LDLCALC", "TRIG", "CHOLHDL", "LDLDIRECT" in the last 72 hours. Thyroid function studies No results for input(s): "TSH", "T4TOTAL", "T3FREE", "THYROIDAB" in the last 72 hours.  Invalid input(s): "FREET3" Anemia work up No results for input(s): "VITAMINB12", "FOLATE", "FERRITIN", "TIBC", "IRON", "RETICCTPCT" in the last 72 hours. Urinalysis    Component Value Date/Time   COLORURINE YELLOW 01/06/2023 2209   APPEARANCEUR  CLEAR 01/06/2023 2209   LABSPEC 1.013 01/06/2023 2209   PHURINE 6.0 01/06/2023 2209   GLUCOSEU NEGATIVE 01/06/2023 2209   HGBUR NEGATIVE 01/06/2023 2209   BILIRUBINUR NEGATIVE 01/06/2023 2209   KETONESUR NEGATIVE 01/06/2023 2209   PROTEINUR NEGATIVE 01/06/2023 2209   NITRITE NEGATIVE 01/06/2023 2209   LEUKOCYTESUR NEGATIVE 01/06/2023 2209   Sepsis Labs Recent Labs  Lab 01/06/23 1543 01/07/23 0738  WBC 15.5* 15.6*   Microbiology Recent Results (from the past 240 hour(s))  Resp panel by RT-PCR (RSV, Flu A&B, Covid)     Status: None   Collection Time: 01/06/23  3:43 PM   Specimen: Nasal Swab  Result Value Ref Range Status   SARS Coronavirus 2 by RT PCR NEGATIVE NEGATIVE Final   Influenza A by PCR NEGATIVE NEGATIVE Final   Influenza B by PCR NEGATIVE NEGATIVE Final    Comment: (NOTE) The Xpert Xpress SARS-CoV-2/FLU/RSV plus assay is intended as an aid in the diagnosis of influenza from Nasopharyngeal swab specimens and should not be used as a sole basis for treatment. Nasal washings and aspirates are unacceptable for Xpert Xpress SARS-CoV-2/FLU/RSV testing.  Fact Sheet for Patients: BloggerCourse.com  Fact Sheet for Healthcare Providers: SeriousBroker.it  This test is not yet approved or cleared by the [place] FDA and has been authorized for detection and/or diagnosis of SARS-CoV-2 by FDA under an Emergency Use Authorization (EUA). This EUA will remain in effect (meaning this test can be used) for the duration of the COVID-19 declaration under Section 564(b)(1) of the Act, 21 U.S.C. section 360bbb-3(b)(1), unless the authorization is terminated or revoked.     Resp Syncytial Virus by PCR NEGATIVE NEGATIVE Final    Comment: (NOTE) Fact Sheet for Patients: BloggerCourse.com  Fact Sheet for Healthcare Providers: SeriousBroker.it  This test is not yet approved or  cleared by the [place] FDA and has been authorized for detection and/or diagnosis of SARS-CoV-2 by FDA under an Emergency Use Authorization (EUA). This EUA will remain in effect (meaning this test can be used) for the duration of the COVID-19 declaration under Section 564(b)(1) of the Act, 21 U.S.C. section 360bbb-3(b)(1), unless the authorization is terminated or revoked.  Performed at [facility], [address] [place], [place] [address]   Blood Culture (routine x 2)     Status: None (Preliminary result)   Collection Time: 01/06/23  3:43 PM   Specimen: BLOOD LEFT FOREARM  Result Value Ref Range Status   Specimen Description BLOOD LEFT FOREARM  Final   Special Requests   Final    BOTTLES DRAWN AEROBIC AND ANAEROBIC Blood Culture adequate volume   Culture   Final    NO GROWTH 3 DAYS Performed at [facility], [address] [place], [place] [address]    Report Status PENDING  Incomplete  Blood Culture (routine x 2)     Status: None (Preliminary result)   Collection Time: 01/06/23  3:55 PM   Specimen: BLOOD  Result Value Ref  Range Status   Specimen Description BLOOD RIGHT ANTECUBITAL  Final   Special Requests   Final    BOTTLES DRAWN AEROBIC AND ANAEROBIC Blood Culture adequate volume   Culture   Final    NO GROWTH 3 DAYS Performed at [facility], [address], [place], [place] [address]    Report Status PENDING  Incomplete     Time coordinating discharge: Over 30 minutes  SIGNED:   Azucena Fallen, DO [facility] 01/09/2023, 3:43 PM Pager   If 7PM-7AM, please contact night-coverage [email or web address]

## 2023-01-09 NOTE — TOC Progression Note (Signed)
Transition of Care [facility]) - Progression Note    Patient Details  Name: Pamela Dunn MRN: [id number] Date of Birth: 1931/04/20  [facility] CM/SW Contact  Mela Perham A [place], [place] Phone Number: 01/09/2023, 10:58 AM  Clinical Narrative:     Update 11/1 1047 Pt accepted to [facility], [facility] informed CSW that bed was not available until Monday. Provider notified. CSW started authorization for pt, medically stable.  Status pending. Auth ID: [id number]  TOC will continue to follow.   Expected Discharge Plan: Skilled Nursing Facility Barriers to Discharge: Continued Medical Work up, English as a second language teacher, SNF Pending bed offer  Expected Discharge Plan and Services         Expected Discharge Date: 01/09/23                                     Social Determinants of Health (SDOH) Interventions SDOH Screenings   Food Insecurity: No Food Insecurity (01/07/2023)  Housing: Low Risk  (01/07/2023)  Transportation Needs: No Transportation Needs (01/07/2023)  Utilities: Not At Risk (01/07/2023)  Tobacco Use: Low Risk  (01/06/2023)    Readmission Risk Interventions    07/13/2020   10:18 AM  Readmission Risk Prevention Plan  Transportation Screening Complete  PCP or Specialist Appt within 5-7 Days Complete  Home Care Screening Complete  Medication Review (RN CM) Complete

## 2023-01-10 DIAGNOSIS — K529 Noninfective gastroenteritis and colitis, unspecified: Secondary | ICD-10-CM | POA: Diagnosis not present

## 2023-01-10 MED ORDER — CALCIUM CARBONATE ANTACID 500 MG PO CHEW
1.0000 | CHEWABLE_TABLET | Freq: Three times a day (TID) | ORAL | Status: DC | PRN
  Administered 2023-01-10 – 2023-01-11 (×3): 200 mg via ORAL
  Filled 2023-01-10 (×3): qty 1

## 2023-01-10 MED ORDER — DICLOFENAC SODIUM 25 MG PO TBEC
50.0000 mg | DELAYED_RELEASE_TABLET | Freq: Four times a day (QID) | ORAL | Status: DC | PRN
  Administered 2023-01-10 – 2023-01-12 (×4): 50 mg via ORAL
  Filled 2023-01-10 (×8): qty 2

## 2023-01-10 MED ORDER — POLYETHYLENE GLYCOL 3350 17 G PO PACK
17.0000 g | PACK | Freq: Every day | ORAL | Status: DC | PRN
  Administered 2023-01-10: 17 g via ORAL
  Filled 2023-01-10: qty 1

## 2023-01-10 NOTE — Progress Notes (Addendum)
Notified Dr. Natale Milch of pt's refusal of BP taken. MD reported ok to give BP med.

## 2023-01-10 NOTE — Plan of Care (Signed)

## 2023-01-10 NOTE — Discharge Summary (Signed)
Physician Discharge Summary  Pamela Dunn ZOX:[id number] DOB: 1931/11/28 DOA: 01/06/2023  PCP: Irena Reichmann, DO  Admit date: 01/06/2023 Discharge date: 01/10/2023  Admitted From: Home Disposition: SNF  Recommendations for Outpatient Follow-up:  Follow up with PCP in 1-2 weeks Follow-up with rheumatology as scheduled in the next 1 to 2 weeks Follow-up with orthopedic surgery in 1 week  Discharge Condition: Stable CODE STATUS: Full Diet recommendation: As tolerated  Brief/Interim Summary: Pamela Dunn is a 89 y.o. female with medical history significant of rheumatoid arthritis, LBBB, chronic combined CHF/nonischemic cardiomyopathy, hypertension, hyperlipidemia, CKD stage IIIa, complete heart block status post PPM, hypothyroidism, history of SBO status post small bowel resection in June 2021, GERD presented to the ED for evaluation of nausea, vomiting, generalized weakness, and hypotension.  Initial imaging at intake of the right elbow showed posterior displacement of the ulna with large 3.7 cm osseous fragment anterior to the humerus concerning for age-indeterminate fracture.  Hospitalist called for admission, Ortho called in consult.   Patient evaluated by orthopedic surgery, no indication for further imaging or procedure at this time, presumed changes on x-ray are secondary to inflammatory arthritis.  Patient otherwise medically stable for discharge however continues to have profound ambulatory dysfunction and weakness in the setting of recent GI illness and arm pain.  PT OT currently recommending placement at skilled nursing facility for ongoing therapy.  Patient remains medically stable for discharge, awaiting safe disposition at skilled nursing facility.  Patient has been offered a bed and has insurance approval today 01/09/2023 but bed is apparently not available until Monday, 01/12/2023  Discharge Diagnoses:  Principal Problem:   Colitis Active Problems:   Rheumatoid arthritis  (HCC)   Chronic diastolic CHF (congestive heart failure) (HCC)   Hypoglycemia   Incidental pulmonary nodule   Fracture of upper arm   Suspected colitis/diverticulitis, resolving Rule out partial small bowel obstruction -CT concerning for partial small bowel obstruction, versus inflammation consistent with colitis/diverticulitis -Ceftriaxone/Flagyl completed, discontinue -Patient's GI symptoms resolved rapidly, continue to advance diet as tolerated-Continue supportive care/bowel regimen as needed   Advanced rheumatoid arthritis, intractable diffuse joint pain Posterior displacement of right ulna/osseous fragment noted -Orthopedic surgery consulted given abnormal imaging, notably chronic changes from inflammatory arthritis per their evaluation, no indication for further imaging or procedure at this time -Pain currently well-controlled, wrapped and secured at this time with splint -Follow-up in 1 week with orthopedic surgery as discussed; follow-up with rheumatology as scheduled   Hypoglycemia -In the setting of poor p.o. intake/n.p.o. status -Taking p.o. well, IV fluids discontinued   Incidental pulmonary nodules - CT abdomen pelvis showing 6 mm and 5 mm right lung base nodules which appear stable since 09/04/2022 but new since 2021.  Follow-up outpatient with PCP for nonemergent outpatient chest CT to evaluate for other nodules.   Chronic CHF/nonischemic cardiomyopathy - Echo done in April 2023 showing EF 57%, grade 1 diastolic dysfunction, moderate tricuspid regurgitation. No indication for repeat echo at this time, patient euvolemic and asymptomatic   Hyperlipidemia - Continue Zetia   Hypothyroidism Continue Synthroid   Rheumatoid arthritis Continue prednisone and sulfasalazine   Hypertension Hold antihypertensives at this time and monitor blood pressure closely.   Discharge Instructions  Discharge Instructions     Discharge patient   Complete by: As directed     Discharge disposition: 03-Skilled Nursing Facility   Discharge patient date: 01/09/2023      Allergies as of 01/10/2023       Reactions   Coreg [carvedilol] Diarrhea  Lorazepam Diarrhea        Medication List     TAKE these medications    Aspirin-Caffeine 500-32.5 MG Tabs Take 1 tablet by mouth in the morning and at bedtime. [employer]   cholecalciferol 1000 units tablet Commonly known as: VITAMIN D Take 1,000 Units by mouth daily.   CoQ10 200 MG Caps Take 200 mg by mouth daily.   diclofenac Sodium 1 % Gel Commonly known as: VOLTAREN Apply 2 g topically 4 (four) times daily.   ezetimibe 10 MG tablet Commonly known as: ZETIA Take 10 mg by mouth daily.   isosorbide-hydrALAZINE 20-37.5 MG tablet Commonly known as: BIDIL TAKE 1 TABLET THREE TIMES DAILY What changed: when to take this   levothyroxine 25 MCG tablet Commonly known as: SYNTHROID Take 25 mcg by mouth daily.   metoprolol succinate 25 MG 24 hr tablet Commonly known as: TOPROL-XL Take 1 tablet (25 mg total) by mouth daily.   OMEGA-3 FISH OIL PO Take 1 capsule by mouth in the morning and at bedtime.   predniSONE 5 MG tablet Commonly known as: DELTASONE Take 5 mg by mouth daily.   sulfaSALAzine 500 MG tablet Commonly known as: AZULFIDINE Take 500 mg by mouth daily.         Allergies  Allergen Reactions   Coreg [Carvedilol] Diarrhea   Lorazepam Diarrhea    Consultations: Orthopedic surgery  Procedures/Studies: CT ABDOMEN PELVIS WO CONTRAST  Result Date: 01/06/2023 CLINICAL DATA:  Hypotension, nausea, and suspected small-bowel obstruction. Previous history of small-bowel obstruction. Questionable sepsis. EXAM: CT ABDOMEN AND PELVIS WITHOUT CONTRAST TECHNIQUE: Multidetector CT imaging of the abdomen and pelvis was performed following the standard protocol without IV contrast. RADIATION DOSE REDUCTION: This exam was performed according to the departmental dose-optimization program  which includes automated exposure control, adjustment of the mA and/or kV according to patient size and/or use of iterative reconstruction technique. COMPARISON:  CTs with IV contrast 09/04/2022 and 08/23/2019. FINDINGS: Lower chest: There are trace pleural effusions. Subpleural linear atelectasis in the lower lobes. Mild chronic elevation right hemidiaphragm. New from 2021 but stable since 09/04/2022, there is a 6 mm right middle lobe noncalcified nodule on 4:2, a 5 mm stable noncalcified right lower lobe anterior basal segment nodule on 4:9, and adjacent 2 mm subpleural right middle lobe nodules on 4:7. Four months of stability is confirmed but there could be other nodules above the plane of current and prior imaging. Chest CT is recommended to assess for additional nodules. No focal consolidation is seen. There is mild diffuse bronchial thickening. Mild cardiomegaly is unchanged. No pericardial effusion. There is metal artifact from pacemaker wiring in the right heart. Coronary artery calcifications. Small hiatal hernia. Hepatobiliary: No liver masses seen without contrast. There are occasional calcified granulomas. There are they few small stones layering in the proximal gallbladder but no wall thickening no biliary dilatation. Pancreas: Partially atrophic. Otherwise unremarkable without contrast. Spleen: No abnormality is seen without contrast. Adrenals/Urinary Tract: There is no adrenal mass. Bilateral renal cortical thinning is again noted. A 1.7 cm Bosniak 1 cyst is again seen in the midpole left kidney anteriorly, Hounsfield density is 2. No follow-up imaging is recommended. No other contour deforming deformity abnormality is seen of the kidneys. There is no urinary stone or obstruction. The bladder is unremarkable for the degree of distention. Stomach/Bowel: Interval increased thickening at the GE junction warranting endoscopic follow-up. Rest of the gastric wall is contracted. There is a mildly dilated  postsurgical small bowel  segment in the midline to right paramidline lower abdomen, measuring 3.6 cm caliber. Remainder of the small bowel is normal caliber. This could be due to a low-grade partial small bowel obstruction along the surgical segment or possibly due to ileus. An appendix is not seen in this patient. There is either wall thickening or underdistention in the proximal transverse colon to the midline, with colitis favored as there are hazy reactive mesenteric inflammatory changes in the area. There is mild fecal stasis in the distal transverse, descending and rectosigmoid colon, without evidence of colitis. There is uncomplicated sigmoid diverticulitis. A moderate-sized stool ball distends the rectum but there is no wall thickening. Vascular/Lymphatic: Extensive aortoiliac and visceral abdominal arterial calcific plaques. No AAA. No adenopathy is seen. Reproductive: Uterus and bilateral adnexa are unremarkable. Other: Small umbilical fat hernia. No incarcerated hernia. Trace ascites in the posterior pelvis and subhepatic space. No drainable pocket is seen. There is no free air, free hemorrhage or localizing collection. Musculoskeletal: Osteopenia and advanced degenerative change lower thoracic and lumbar spine. Bone-on-bone superior joint space loss of both hips with acetabular osteophytes. Acquired spinal stenosis again noted L1-2, L2-3, and L3-4 with multilevel lumbar foraminal stenosis. No suspicious regional osseous lesions. IMPRESSION: 1. Mildly dilated postsurgical small bowel segment in the midline to right paramidline lower abdomen, measuring 3.6 cm caliber. This could be due to a low-grade partial small bowel obstruction along the surgical segment or possibly due to ileus. 2. Proximal to mid transverse colon wall thickening or underdistention, with colitis favored. 3. Constipation and uncomplicated sigmoid diverticulitis. 4. Trace ascites. No drainable pocket. 5. Trace pleural effusions with  bibasilar atelectasis. 6. Cholelithiasis. 7. Aortic and coronary artery atherosclerosis. 8. Osteopenia and advanced degenerative change. 9. 6 mm and 5 mm right lung base nodules are stable since 09/04/2022 but new since 2021. Nonemergent chest CT is recommended to look for other nodules. Aortic Atherosclerosis (ICD10-I70.0). Electronically Signed   By: Almira Bar M.D.   On: 01/06/2023 21:03   DG Elbow Complete Left  Result Date: 01/06/2023 CLINICAL DATA:  Hypotension. Possible fall. Large skin tear to right forearm. Unclear etiology. Unable to right elbow. EXAM: LEFT ELBOW - COMPLETE 3+ VIEW; RIGHT ELBOW - COMPLETE 3+ VIEW COMPARISON:  Radiographs 01/06/2009 FINDINGS: Right: Severe erosive arthritic changes are redemonstrated. There is posterior displacement of the ulna in relation to the humerus. Large 3.7 cm osseous fragment anterior to the humerus compatible with age-indeterminate fracture fragment. Left: Severe erosive changes. Plate and screw fixation distal humerus. No definite acute fracture. No dislocation. IMPRESSION: 1. Posterior displacement of the right ulna in relation to the humerus. 2. Large 3.7 cm osseous fragment anterior to the right humerus compatible with age-indeterminate fracture fragment. 3. Severe erosive arthritic changes bilaterally. Electronically Signed   By: Minerva Fester M.D.   On: 01/06/2023 20:08   DG Elbow Complete Right  Result Date: 01/06/2023 CLINICAL DATA:  Hypotension. Possible fall. Large skin tear to right forearm. Unclear etiology. Unable to right elbow. EXAM: LEFT ELBOW - COMPLETE 3+ VIEW; RIGHT ELBOW - COMPLETE 3+ VIEW COMPARISON:  Radiographs 01/06/2009 FINDINGS: Right: Severe erosive arthritic changes are redemonstrated. There is posterior displacement of the ulna in relation to the humerus. Large 3.7 cm osseous fragment anterior to the humerus compatible with age-indeterminate fracture fragment. Left: Severe erosive changes. Plate and screw fixation  distal humerus. No definite acute fracture. No dislocation. IMPRESSION: 1. Posterior displacement of the right ulna in relation to the humerus. 2. Large 3.7 cm osseous fragment anterior  to the right humerus compatible with age-indeterminate fracture fragment. 3. Severe erosive arthritic changes bilaterally. Electronically Signed   By: Minerva Fester M.D.   On: 01/06/2023 20:08   DG Chest Port 1 View  Result Date: 01/06/2023 CLINICAL DATA:  Questionable sepsis-evaluate for abnormality, nausea, vomiting, weakness, hypotension EXAM: PORTABLE CHEST 1 VIEW COMPARISON:  03/04/2022 FINDINGS: Stable cardiomegaly. Aortic atherosclerotic calcification. Left midlung scarring. Left basilar atelectasis. No focal consolidation, pleural effusion, or pneumothorax. No displaced rib fractures. Left chest wall pacemaker. Left TSA. IMPRESSION: No active disease. Electronically Signed   By: Minerva Fester M.D.   On: 01/06/2023 20:03   CT Head Wo Contrast  Result Date: 01/06/2023 CLINICAL DATA:  Head trauma EXAM: CT HEAD WITHOUT CONTRAST TECHNIQUE: Contiguous axial images were obtained from the base of the skull through the vertex without intravenous contrast. RADIATION DOSE REDUCTION: This exam was performed according to the departmental dose-optimization program which includes automated exposure control, adjustment of the mA and/or kV according to patient size and/or use of iterative reconstruction technique. COMPARISON:  None Available. FINDINGS: Brain: There is no mass, hemorrhage or extra-axial collection. There is generalized atrophy without lobar predilection. Hypodensity of the white matter is most commonly associated with chronic microvascular disease. Old anterior right MCA territory infarct. Vascular: Atherosclerotic calcification of the vertebral and internal carotid arteries at the skull base. No abnormal hyperdensity of the major intracranial arteries or dural venous sinuses. Skull: The visualized skull base,  calvarium and extracranial soft tissues are normal. Sinuses/Orbits: No fluid levels or advanced mucosal thickening of the visualized paranasal sinuses. No mastoid or middle ear effusion. Normal orbits. IMPRESSION: 1. No acute intracranial abnormality. 2. Old anterior right MCA territory infarct and findings of chronic microvascular disease. Electronically Signed   By: Deatra Robinson M.D.   On: 01/06/2023 19:08     Subjective: No acute issues or events overnight   Discharge Exam: Vitals:   01/09/23 0752 01/10/23 0735  Pulse:  70  Resp: 17 18  Temp: 97.6 F (36.4 C) 97.8 F (36.6 C)  SpO2: 98% 99%   Vitals:   01/07/23 2017 01/09/23 0752 01/09/23 1218 01/10/23 0735  BP:  (!) 158/68 (!) 153/69   Pulse:  81 88 70  Resp: 18 17  18   Temp:  97.6 F (36.4 C)  97.8 F (36.6 C)  TempSrc: Oral Oral  Oral  SpO2: 97% 98%  99%  Weight:      Height:        General: Pt is alert, awake, not in acute distress Cardiovascular: RRR, S1/S2 +, no rubs, no gallops Respiratory: CTA bilaterally, no wheezing, no rhonchi Abdominal: Soft, NT, ND, bowel sounds + Extremities: Right arm below bandage clean dry intact    The results of significant diagnostics from this hospitalization (including imaging, microbiology, ancillary and laboratory) are listed below for reference.     Microbiology: Recent Results (from the past 240 hour(s))  Resp panel by RT-PCR (RSV, Flu A&B, Covid)     Status: None   Collection Time: 01/06/23  3:43 PM   Specimen: Nasal Swab  Result Value Ref Range Status   SARS Coronavirus 2 by RT PCR NEGATIVE NEGATIVE Final   Influenza A by PCR NEGATIVE NEGATIVE Final   Influenza B by PCR NEGATIVE NEGATIVE Final    Comment: (NOTE) The Xpert Xpress SARS-CoV-2/FLU/RSV plus assay is intended as an aid in the diagnosis of influenza from Nasopharyngeal swab specimens and should not be used as a sole basis for treatment. Nasal  washings and aspirates are unacceptable for Xpert Xpress  SARS-CoV-2/FLU/RSV testing.  Fact Sheet for Patients: BloggerCourse.com  Fact Sheet for Healthcare Providers: SeriousBroker.it  This test is not yet approved or cleared by the [place] FDA and has been authorized for detection and/or diagnosis of SARS-CoV-2 by FDA under an Emergency Use Authorization (EUA). This EUA will remain in effect (meaning this test can be used) for the duration of the COVID-19 declaration under Section 564(b)(1) of the Act, 21 U.S.C. section 360bbb-3(b)(1), unless the authorization is terminated or revoked.     Resp Syncytial Virus by PCR NEGATIVE NEGATIVE Final    Comment: (NOTE) Fact Sheet for Patients: BloggerCourse.com  Fact Sheet for Healthcare Providers: SeriousBroker.it  This test is not yet approved or cleared by the [place] FDA and has been authorized for detection and/or diagnosis of SARS-CoV-2 by FDA under an Emergency Use Authorization (EUA). This EUA will remain in effect (meaning this test can be used) for the duration of the COVID-19 declaration under Section 564(b)(1) of the Act, 21 U.S.C. section 360bbb-3(b)(1), unless the authorization is terminated or revoked.  Performed at [facility], [address], [place], [place] [address]   Blood Culture (routine x 2)     Status: None (Preliminary result)   Collection Time: 01/06/23  3:43 PM   Specimen: BLOOD LEFT FOREARM  Result Value Ref Range Status   Specimen Description BLOOD LEFT FOREARM  Final   Special Requests   Final    BOTTLES DRAWN AEROBIC AND ANAEROBIC Blood Culture adequate volume   Culture   Final    NO GROWTH 4 DAYS Performed at [facility], [address], [place], [place] [address]    Report Status PENDING  Incomplete  Blood Culture (routine x 2)     Status: None (Preliminary result)   Collection Time: 01/06/23  3:55 PM   Specimen: BLOOD   Result Value Ref Range Status   Specimen Description BLOOD RIGHT ANTECUBITAL  Final   Special Requests   Final    BOTTLES DRAWN AEROBIC AND ANAEROBIC Blood Culture adequate volume   Culture   Final    NO GROWTH 4 DAYS Performed at [facility], [address] [place], [place] [address]    Report Status PENDING  Incomplete     Labs: BNP (last 3 results) Recent Labs    01/07/23 0738  BNP 385.1*   Basic Metabolic Panel: Recent Labs  Lab 01/06/23 1543 01/07/23 0738  NA 139 136  K 3.9 4.0  CL 102 102  CO2 26 23  GLUCOSE 65* 74  BUN 22 23  CREATININE 1.31* 1.12*  CALCIUM 8.4* 8.2*   Liver Function Tests: Recent Labs  Lab 01/06/23 1543  AST 27  ALT 9  ALKPHOS 42  BILITOT 1.0  PROT 5.0*  ALBUMIN 2.7*   No results for input(s): "LIPASE", "AMYLASE" in the last 168 hours. No results for input(s): "AMMONIA" in the last 168 hours. CBC: Recent Labs  Lab 01/06/23 1543 01/07/23 0738  WBC 15.5* 15.6*  NEUTROABS 13.4*  --   HGB 11.7* 11.2*  HCT 36.7 33.0*  MCV 94.6 92.4  PLT 281 241   Cardiac Enzymes: No results for input(s): "CKTOTAL", "CKMB", "CKMBINDEX", "TROPONINI" in the last 168 hours. BNP: Invalid input(s): "POCBNP" CBG: Recent Labs  Lab 01/07/23 0807 01/07/23 1701 01/07/23 2020 01/08/23 0417 01/09/23 0750  GLUCAP 65* 118* 119* 89 109*   D-Dimer No results for input(s): "DDIMER" in the last  72 hours. Hgb A1c No results for input(s): "HGBA1C" in the last 72 hours. Lipid Profile No results for input(s): "CHOL", "HDL", "LDLCALC", "TRIG", "CHOLHDL", "LDLDIRECT" in the last 72 hours. Thyroid function studies No results for input(s): "TSH", "T4TOTAL", "T3FREE", "THYROIDAB" in the last 72 hours.  Invalid input(s): "FREET3" Anemia work up No results for input(s): "VITAMINB12", "FOLATE", "FERRITIN", "TIBC", "IRON", "RETICCTPCT" in the last 72 hours. Urinalysis    Component Value Date/Time   COLORURINE YELLOW 01/06/2023 2209   APPEARANCEUR  CLEAR 01/06/2023 2209   LABSPEC 1.013 01/06/2023 2209   PHURINE 6.0 01/06/2023 2209   GLUCOSEU NEGATIVE 01/06/2023 2209   HGBUR NEGATIVE 01/06/2023 2209   BILIRUBINUR NEGATIVE 01/06/2023 2209   KETONESUR NEGATIVE 01/06/2023 2209   PROTEINUR NEGATIVE 01/06/2023 2209   NITRITE NEGATIVE 01/06/2023 2209   LEUKOCYTESUR NEGATIVE 01/06/2023 2209   Sepsis Labs Recent Labs  Lab 01/06/23 1543 01/07/23 0738  WBC 15.5* 15.6*   Microbiology Recent Results (from the past 240 hour(s))  Resp panel by RT-PCR (RSV, Flu A&B, Covid)     Status: None   Collection Time: 01/06/23  3:43 PM   Specimen: Nasal Swab  Result Value Ref Range Status   SARS Coronavirus 2 by RT PCR NEGATIVE NEGATIVE Final   Influenza A by PCR NEGATIVE NEGATIVE Final   Influenza B by PCR NEGATIVE NEGATIVE Final    Comment: (NOTE) The Xpert Xpress SARS-CoV-2/FLU/RSV plus assay is intended as an aid in the diagnosis of influenza from Nasopharyngeal swab specimens and should not be used as a sole basis for treatment. Nasal washings and aspirates are unacceptable for Xpert Xpress SARS-CoV-2/FLU/RSV testing.  Fact Sheet for Patients: BloggerCourse.com  Fact Sheet for Healthcare Providers: SeriousBroker.it  This test is not yet approved or cleared by the [place] FDA and has been authorized for detection and/or diagnosis of SARS-CoV-2 by FDA under an Emergency Use Authorization (EUA). This EUA will remain in effect (meaning this test can be used) for the duration of the COVID-19 declaration under Section 564(b)(1) of the Act, 21 U.S.C. section 360bbb-3(b)(1), unless the authorization is terminated or revoked.     Resp Syncytial Virus by PCR NEGATIVE NEGATIVE Final    Comment: (NOTE) Fact Sheet for Patients: BloggerCourse.com  Fact Sheet for Healthcare Providers: SeriousBroker.it  This test is not yet approved or  cleared by the [place] FDA and has been authorized for detection and/or diagnosis of SARS-CoV-2 by FDA under an Emergency Use Authorization (EUA). This EUA will remain in effect (meaning this test can be used) for the duration of the COVID-19 declaration under Section 564(b)(1) of the Act, 21 U.S.C. section 360bbb-3(b)(1), unless the authorization is terminated or revoked.  Performed at [facility], [address], [place], [place] [address]   Blood Culture (routine x 2)     Status: None (Preliminary result)   Collection Time: 01/06/23  3:43 PM   Specimen: BLOOD LEFT FOREARM  Result Value Ref Range Status   Specimen Description BLOOD LEFT FOREARM  Final   Special Requests   Final    BOTTLES DRAWN AEROBIC AND ANAEROBIC Blood Culture adequate volume   Culture   Final    NO GROWTH 4 DAYS Performed at [facility], [address], [place], [place] [address]    Report Status PENDING  Incomplete  Blood Culture (routine x 2)     Status: None (Preliminary result)   Collection Time: 01/06/23  3:55 PM   Specimen: BLOOD  Result Value Ref Range  Status   Specimen Description BLOOD RIGHT ANTECUBITAL  Final   Special Requests   Final    BOTTLES DRAWN AEROBIC AND ANAEROBIC Blood Culture adequate volume   Culture   Final    NO GROWTH 4 DAYS Performed at [facility], [address] [place], [place] [address]    Report Status PENDING  Incomplete     Time coordinating discharge: Over 30 minutes  SIGNED:   Azucena Fallen, DO [facility] 01/10/2023, 12:27 PM Pager   If 7PM-7AM, please contact night-coverage [email or web address]

## 2023-01-11 DIAGNOSIS — K529 Noninfective gastroenteritis and colitis, unspecified: Secondary | ICD-10-CM | POA: Diagnosis not present

## 2023-01-11 LAB — CULTURE, BLOOD (ROUTINE X 2)
Culture: NO GROWTH
Culture: NO GROWTH
Special Requests: ADEQUATE
Special Requests: ADEQUATE

## 2023-01-11 NOTE — Progress Notes (Signed)
Physician Discharge Summary  Pamela Dunn ION:[id number] DOB: 1932-02-19 DOA: 01/06/2023  PCP: Irena Reichmann, DO  Admit date: 01/06/2023 Discharge date: 01/11/2023  Admitted From: Home Disposition: SNF  Recommendations for Outpatient Follow-up:  Follow up with PCP in 1-2 weeks Follow-up with rheumatology as scheduled in the next 1 to 2 weeks Follow-up with orthopedic surgery in 1 week  Discharge Condition: Stable CODE STATUS: Full Diet recommendation: As tolerated  Brief/Interim Summary: Pamela Dunn is a 89 y.o. female with medical history significant of rheumatoid arthritis, LBBB, chronic combined CHF/nonischemic cardiomyopathy, hypertension, hyperlipidemia, CKD stage IIIa, complete heart block status post PPM, hypothyroidism, history of SBO status post small bowel resection in June 2021, GERD presented to the ED for evaluation of nausea, vomiting, generalized weakness, and hypotension.  Initial imaging at intake of the right elbow showed posterior displacement of the ulna with large 3.7 cm osseous fragment anterior to the humerus concerning for age-indeterminate fracture.  Hospitalist called for admission, Ortho called in consult.   Patient evaluated by orthopedic surgery, no indication for further imaging or procedure at this time, presumed changes on x-ray are secondary to inflammatory arthritis.  Patient otherwise medically stable for discharge however continues to have profound ambulatory dysfunction and weakness in the setting of recent GI illness and arm pain.  PT OT currently recommending placement at skilled nursing facility for ongoing therapy.  Patient remains medically stable for discharge, awaiting safe disposition at skilled nursing facility.  Patient has been offered a bed and has insurance approval today 01/09/2023 but bed is apparently not available until Monday, 01/12/2023  Discharge Diagnoses:  Principal Problem:   Colitis Active Problems:   Rheumatoid arthritis  (HCC)   Chronic diastolic CHF (congestive heart failure) (HCC)   Hypoglycemia   Incidental pulmonary nodule   Fracture of upper arm   Suspected colitis/diverticulitis, resolving Rule out partial small bowel obstruction -CT concerning for partial small bowel obstruction, versus inflammation consistent with colitis/diverticulitis -Ceftriaxone/Flagyl completed, discontinue -Patient's GI symptoms resolved rapidly, continue to advance diet as tolerated-Continue supportive care/bowel regimen as needed   Advanced rheumatoid arthritis, intractable diffuse joint pain Posterior displacement of right ulna/osseous fragment noted -Orthopedic surgery consulted given abnormal imaging, notably chronic changes from inflammatory arthritis per their evaluation, no indication for further imaging or procedure at this time -Pain currently well-controlled, wrapped and secured at this time with splint -Follow-up in 1 week with orthopedic surgery as discussed; follow-up with rheumatology as scheduled   Hypoglycemia -In the setting of poor p.o. intake/n.p.o. status -Taking p.o. well, IV fluids discontinued   Incidental pulmonary nodules - CT abdomen pelvis showing 6 mm and 5 mm right lung base nodules which appear stable since 09/04/2022 but new since 2021.  Follow-up outpatient with PCP for nonemergent outpatient chest CT to evaluate for other nodules.   Chronic CHF/nonischemic cardiomyopathy - Echo done in April 2023 showing EF 57%, grade 1 diastolic dysfunction, moderate tricuspid regurgitation. No indication for repeat echo at this time, patient euvolemic and asymptomatic   Hyperlipidemia - Continue Zetia   Hypothyroidism Continue Synthroid   Rheumatoid arthritis Continue prednisone and sulfasalazine   Hypertension Hold antihypertensives at this time and monitor blood pressure closely.   Discharge Instructions  Discharge Instructions     Discharge patient   Complete by: As directed     Discharge disposition: 03-Skilled Nursing Facility   Discharge patient date: 01/09/2023      Allergies as of 01/11/2023       Reactions   Coreg [carvedilol] Diarrhea  Lorazepam Diarrhea        Medication List     TAKE these medications    Aspirin-Caffeine 500-32.5 MG Tabs Take 1 tablet by mouth in the morning and at bedtime. [employer]   cholecalciferol 1000 units tablet Commonly known as: VITAMIN D Take 1,000 Units by mouth daily.   CoQ10 200 MG Caps Take 200 mg by mouth daily.   diclofenac Sodium 1 % Gel Commonly known as: VOLTAREN Apply 2 g topically 4 (four) times daily.   ezetimibe 10 MG tablet Commonly known as: ZETIA Take 10 mg by mouth daily.   isosorbide-hydrALAZINE 20-37.5 MG tablet Commonly known as: BIDIL TAKE 1 TABLET THREE TIMES DAILY What changed: when to take this   levothyroxine 25 MCG tablet Commonly known as: SYNTHROID Take 25 mcg by mouth daily.   metoprolol succinate 25 MG 24 hr tablet Commonly known as: TOPROL-XL Take 1 tablet (25 mg total) by mouth daily.   OMEGA-3 FISH OIL PO Take 1 capsule by mouth in the morning and at bedtime.   predniSONE 5 MG tablet Commonly known as: DELTASONE Take 5 mg by mouth daily.   sulfaSALAzine 500 MG tablet Commonly known as: AZULFIDINE Take 500 mg by mouth daily.          Allergies  Allergen Reactions   Coreg [Carvedilol] Diarrhea   Lorazepam Diarrhea    Consultations: Orthopedic surgery  Procedures/Studies: CT ABDOMEN PELVIS WO CONTRAST  Result Date: 01/06/2023 CLINICAL DATA:  Hypotension, nausea, and suspected small-bowel obstruction. Previous history of small-bowel obstruction. Questionable sepsis. EXAM: CT ABDOMEN AND PELVIS WITHOUT CONTRAST TECHNIQUE: Multidetector CT imaging of the abdomen and pelvis was performed following the standard protocol without IV contrast. RADIATION DOSE REDUCTION: This exam was performed according to the departmental dose-optimization  program which includes automated exposure control, adjustment of the mA and/or kV according to patient size and/or use of iterative reconstruction technique. COMPARISON:  CTs with IV contrast 09/04/2022 and 08/23/2019. FINDINGS: Lower chest: There are trace pleural effusions. Subpleural linear atelectasis in the lower lobes. Mild chronic elevation right hemidiaphragm. New from 2021 but stable since 09/04/2022, there is a 6 mm right middle lobe noncalcified nodule on 4:2, a 5 mm stable noncalcified right lower lobe anterior basal segment nodule on 4:9, and adjacent 2 mm subpleural right middle lobe nodules on 4:7. Four months of stability is confirmed but there could be other nodules above the plane of current and prior imaging. Chest CT is recommended to assess for additional nodules. No focal consolidation is seen. There is mild diffuse bronchial thickening. Mild cardiomegaly is unchanged. No pericardial effusion. There is metal artifact from pacemaker wiring in the right heart. Coronary artery calcifications. Small hiatal hernia. Hepatobiliary: No liver masses seen without contrast. There are occasional calcified granulomas. There are they few small stones layering in the proximal gallbladder but no wall thickening no biliary dilatation. Pancreas: Partially atrophic. Otherwise unremarkable without contrast. Spleen: No abnormality is seen without contrast. Adrenals/Urinary Tract: There is no adrenal mass. Bilateral renal cortical thinning is again noted. A 1.7 cm Bosniak 1 cyst is again seen in the midpole left kidney anteriorly, Hounsfield density is 2. No follow-up imaging is recommended. No other contour deforming deformity abnormality is seen of the kidneys. There is no urinary stone or obstruction. The bladder is unremarkable for the degree of distention. Stomach/Bowel: Interval increased thickening at the GE junction warranting endoscopic follow-up. Rest of the gastric wall is contracted. There is a mildly  dilated postsurgical small  bowel segment in the midline to right paramidline lower abdomen, measuring 3.6 cm caliber. Remainder of the small bowel is normal caliber. This could be due to a low-grade partial small bowel obstruction along the surgical segment or possibly due to ileus. An appendix is not seen in this patient. There is either wall thickening or underdistention in the proximal transverse colon to the midline, with colitis favored as there are hazy reactive mesenteric inflammatory changes in the area. There is mild fecal stasis in the distal transverse, descending and rectosigmoid colon, without evidence of colitis. There is uncomplicated sigmoid diverticulitis. A moderate-sized stool ball distends the rectum but there is no wall thickening. Vascular/Lymphatic: Extensive aortoiliac and visceral abdominal arterial calcific plaques. No AAA. No adenopathy is seen. Reproductive: Uterus and bilateral adnexa are unremarkable. Other: Small umbilical fat hernia. No incarcerated hernia. Trace ascites in the posterior pelvis and subhepatic space. No drainable pocket is seen. There is no free air, free hemorrhage or localizing collection. Musculoskeletal: Osteopenia and advanced degenerative change lower thoracic and lumbar spine. Bone-on-bone superior joint space loss of both hips with acetabular osteophytes. Acquired spinal stenosis again noted L1-2, L2-3, and L3-4 with multilevel lumbar foraminal stenosis. No suspicious regional osseous lesions. IMPRESSION: 1. Mildly dilated postsurgical small bowel segment in the midline to right paramidline lower abdomen, measuring 3.6 cm caliber. This could be due to a low-grade partial small bowel obstruction along the surgical segment or possibly due to ileus. 2. Proximal to mid transverse colon wall thickening or underdistention, with colitis favored. 3. Constipation and uncomplicated sigmoid diverticulitis. 4. Trace ascites. No drainable pocket. 5. Trace pleural effusions  with bibasilar atelectasis. 6. Cholelithiasis. 7. Aortic and coronary artery atherosclerosis. 8. Osteopenia and advanced degenerative change. 9. 6 mm and 5 mm right lung base nodules are stable since 09/04/2022 but new since 2021. Nonemergent chest CT is recommended to look for other nodules. Aortic Atherosclerosis (ICD10-I70.0). Electronically Signed   By: Almira Bar M.D.   On: 01/06/2023 21:03   DG Elbow Complete Left  Result Date: 01/06/2023 CLINICAL DATA:  Hypotension. Possible fall. Large skin tear to right forearm. Unclear etiology. Unable to right elbow. EXAM: LEFT ELBOW - COMPLETE 3+ VIEW; RIGHT ELBOW - COMPLETE 3+ VIEW COMPARISON:  Radiographs 01/06/2009 FINDINGS: Right: Severe erosive arthritic changes are redemonstrated. There is posterior displacement of the ulna in relation to the humerus. Large 3.7 cm osseous fragment anterior to the humerus compatible with age-indeterminate fracture fragment. Left: Severe erosive changes. Plate and screw fixation distal humerus. No definite acute fracture. No dislocation. IMPRESSION: 1. Posterior displacement of the right ulna in relation to the humerus. 2. Large 3.7 cm osseous fragment anterior to the right humerus compatible with age-indeterminate fracture fragment. 3. Severe erosive arthritic changes bilaterally. Electronically Signed   By: Minerva Fester M.D.   On: 01/06/2023 20:08   DG Elbow Complete Right  Result Date: 01/06/2023 CLINICAL DATA:  Hypotension. Possible fall. Large skin tear to right forearm. Unclear etiology. Unable to right elbow. EXAM: LEFT ELBOW - COMPLETE 3+ VIEW; RIGHT ELBOW - COMPLETE 3+ VIEW COMPARISON:  Radiographs 01/06/2009 FINDINGS: Right: Severe erosive arthritic changes are redemonstrated. There is posterior displacement of the ulna in relation to the humerus. Large 3.7 cm osseous fragment anterior to the humerus compatible with age-indeterminate fracture fragment. Left: Severe erosive changes. Plate and screw fixation  distal humerus. No definite acute fracture. No dislocation. IMPRESSION: 1. Posterior displacement of the right ulna in relation to the humerus. 2. Large 3.7 cm osseous fragment  anterior to the right humerus compatible with age-indeterminate fracture fragment. 3. Severe erosive arthritic changes bilaterally. Electronically Signed   By: Minerva Fester M.D.   On: 01/06/2023 20:08   DG Chest Port 1 View  Result Date: 01/06/2023 CLINICAL DATA:  Questionable sepsis-evaluate for abnormality, nausea, vomiting, weakness, hypotension EXAM: PORTABLE CHEST 1 VIEW COMPARISON:  03/04/2022 FINDINGS: Stable cardiomegaly. Aortic atherosclerotic calcification. Left midlung scarring. Left basilar atelectasis. No focal consolidation, pleural effusion, or pneumothorax. No displaced rib fractures. Left chest wall pacemaker. Left TSA. IMPRESSION: No active disease. Electronically Signed   By: Minerva Fester M.D.   On: 01/06/2023 20:03   CT Head Wo Contrast  Result Date: 01/06/2023 CLINICAL DATA:  Head trauma EXAM: CT HEAD WITHOUT CONTRAST TECHNIQUE: Contiguous axial images were obtained from the base of the skull through the vertex without intravenous contrast. RADIATION DOSE REDUCTION: This exam was performed according to the departmental dose-optimization program which includes automated exposure control, adjustment of the mA and/or kV according to patient size and/or use of iterative reconstruction technique. COMPARISON:  None Available. FINDINGS: Brain: There is no mass, hemorrhage or extra-axial collection. There is generalized atrophy without lobar predilection. Hypodensity of the white matter is most commonly associated with chronic microvascular disease. Old anterior right MCA territory infarct. Vascular: Atherosclerotic calcification of the vertebral and internal carotid arteries at the skull base. No abnormal hyperdensity of the major intracranial arteries or dural venous sinuses. Skull: The visualized skull base,  calvarium and extracranial soft tissues are normal. Sinuses/Orbits: No fluid levels or advanced mucosal thickening of the visualized paranasal sinuses. No mastoid or middle ear effusion. Normal orbits. IMPRESSION: 1. No acute intracranial abnormality. 2. Old anterior right MCA territory infarct and findings of chronic microvascular disease. Electronically Signed   By: Deatra Robinson M.D.   On: 01/06/2023 19:08     Subjective: No acute issues or events overnight   Discharge Exam: Vitals:   01/11/23 0345 01/11/23 0734  Pulse: 83 80  Resp: 18 16  Temp: 98.4 F (36.9 C) 97.9 F (36.6 C)  SpO2: 99% 96%   Vitals:   01/10/23 1645 01/10/23 1945 01/11/23 0345 01/11/23 0734  Pulse: 77 76 83 80  Resp: 18 18 18 16   Temp: 97.9 F (36.6 C) 97.9 F (36.6 C) 98.4 F (36.9 C) 97.9 F (36.6 C)  TempSrc: Oral Oral  Oral  SpO2: 97% 98% 99% 96%  Weight:      Height:        General: Pt is alert, awake, not in acute distress Cardiovascular: RRR, S1/S2 +, no rubs, no gallops Respiratory: CTA bilaterally, no wheezing, no rhonchi Abdominal: Soft, NT, ND, bowel sounds + Extremities: Right arm below bandage clean dry intact    The results of significant diagnostics from this hospitalization (including imaging, microbiology, ancillary and laboratory) are listed below for reference.     Microbiology: Recent Results (from the past 240 hour(s))  Resp panel by RT-PCR (RSV, Flu A&B, Covid)     Status: None   Collection Time: 01/06/23  3:43 PM   Specimen: Nasal Swab  Result Value Ref Range Status   SARS Coronavirus 2 by RT PCR NEGATIVE NEGATIVE Final   Influenza A by PCR NEGATIVE NEGATIVE Final   Influenza B by PCR NEGATIVE NEGATIVE Final    Comment: (NOTE) The Xpert Xpress SARS-CoV-2/FLU/RSV plus assay is intended as an aid in the diagnosis of influenza from Nasopharyngeal swab specimens and should not be used as a sole basis for treatment. Nasal washings  and aspirates are unacceptable for Xpert  Xpress SARS-CoV-2/FLU/RSV testing.  Fact Sheet for Patients: BloggerCourse.com  Fact Sheet for Healthcare Providers: SeriousBroker.it  This test is not yet approved or cleared by the [place] FDA and has been authorized for detection and/or diagnosis of SARS-CoV-2 by FDA under an Emergency Use Authorization (EUA). This EUA will remain in effect (meaning this test can be used) for the duration of the COVID-19 declaration under Section 564(b)(1) of the Act, 21 U.S.C. section 360bbb-3(b)(1), unless the authorization is terminated or revoked.     Resp Syncytial Virus by PCR NEGATIVE NEGATIVE Final    Comment: (NOTE) Fact Sheet for Patients: BloggerCourse.com  Fact Sheet for Healthcare Providers: SeriousBroker.it  This test is not yet approved or cleared by the [place] FDA and has been authorized for detection and/or diagnosis of SARS-CoV-2 by FDA under an Emergency Use Authorization (EUA). This EUA will remain in effect (meaning this test can be used) for the duration of the COVID-19 declaration under Section 564(b)(1) of the Act, 21 U.S.C. section 360bbb-3(b)(1), unless the authorization is terminated or revoked.  Performed at [facility], [address] [place], [place] [address]   Blood Culture (routine x 2)     Status: None   Collection Time: 01/06/23  3:43 PM   Specimen: BLOOD LEFT FOREARM  Result Value Ref Range Status   Specimen Description BLOOD LEFT FOREARM  Final   Special Requests   Final    BOTTLES DRAWN AEROBIC AND ANAEROBIC Blood Culture adequate volume   Culture   Final    NO GROWTH 5 DAYS Performed at [facility], [address] [place], [place] [address]    Report Status 01/11/2023 FINAL  Final  Blood Culture (routine x 2)     Status: None   Collection Time: 01/06/23  3:55 PM   Specimen: BLOOD  Result Value Ref Range Status    Specimen Description BLOOD RIGHT ANTECUBITAL  Final   Special Requests   Final    BOTTLES DRAWN AEROBIC AND ANAEROBIC Blood Culture adequate volume   Culture   Final    NO GROWTH 5 DAYS Performed at [facility], [address], [place], [place] [address]    Report Status 01/11/2023 FINAL  Final     Labs: BNP (last 3 results) Recent Labs    01/07/23 0738  BNP 385.1*   Basic Metabolic Panel: Recent Labs  Lab 01/06/23 1543 01/07/23 0738  NA 139 136  K 3.9 4.0  CL 102 102  CO2 26 23  GLUCOSE 65* 74  BUN 22 23  CREATININE 1.31* 1.12*  CALCIUM 8.4* 8.2*   Liver Function Tests: Recent Labs  Lab 01/06/23 1543  AST 27  ALT 9  ALKPHOS 42  BILITOT 1.0  PROT 5.0*  ALBUMIN 2.7*   No results for input(s): "LIPASE", "AMYLASE" in the last 168 hours. No results for input(s): "AMMONIA" in the last 168 hours. CBC: Recent Labs  Lab 01/06/23 1543 01/07/23 0738  WBC 15.5* 15.6*  NEUTROABS 13.4*  --   HGB 11.7* 11.2*  HCT 36.7 33.0*  MCV 94.6 92.4  PLT 281 241   Cardiac Enzymes: No results for input(s): "CKTOTAL", "CKMB", "CKMBINDEX", "TROPONINI" in the last 168 hours. BNP: Invalid input(s): "POCBNP" CBG: Recent Labs  Lab 01/07/23 0807 01/07/23 1701 01/07/23 2020 01/08/23 0417 01/09/23 0750  GLUCAP 65* 118* 119* 89 109*   D-Dimer No results for input(s): "DDIMER" in the last 72 hours. Hgb  A1c No results for input(s): "HGBA1C" in the last 72 hours. Lipid Profile No results for input(s): "CHOL", "HDL", "LDLCALC", "TRIG", "CHOLHDL", "LDLDIRECT" in the last 72 hours. Thyroid function studies No results for input(s): "TSH", "T4TOTAL", "T3FREE", "THYROIDAB" in the last 72 hours.  Invalid input(s): "FREET3" Anemia work up No results for input(s): "VITAMINB12", "FOLATE", "FERRITIN", "TIBC", "IRON", "RETICCTPCT" in the last 72 hours. Urinalysis    Component Value Date/Time   COLORURINE YELLOW 01/06/2023 2209   APPEARANCEUR CLEAR 01/06/2023 2209   LABSPEC  1.013 01/06/2023 2209   PHURINE 6.0 01/06/2023 2209   GLUCOSEU NEGATIVE 01/06/2023 2209   HGBUR NEGATIVE 01/06/2023 2209   BILIRUBINUR NEGATIVE 01/06/2023 2209   KETONESUR NEGATIVE 01/06/2023 2209   PROTEINUR NEGATIVE 01/06/2023 2209   NITRITE NEGATIVE 01/06/2023 2209   LEUKOCYTESUR NEGATIVE 01/06/2023 2209   Sepsis Labs Recent Labs  Lab 01/06/23 1543 01/07/23 0738  WBC 15.5* 15.6*   Microbiology Recent Results (from the past 240 hour(s))  Resp panel by RT-PCR (RSV, Flu A&B, Covid)     Status: None   Collection Time: 01/06/23  3:43 PM   Specimen: Nasal Swab  Result Value Ref Range Status   SARS Coronavirus 2 by RT PCR NEGATIVE NEGATIVE Final   Influenza A by PCR NEGATIVE NEGATIVE Final   Influenza B by PCR NEGATIVE NEGATIVE Final    Comment: (NOTE) The Xpert Xpress SARS-CoV-2/FLU/RSV plus assay is intended as an aid in the diagnosis of influenza from Nasopharyngeal swab specimens and should not be used as a sole basis for treatment. Nasal washings and aspirates are unacceptable for Xpert Xpress SARS-CoV-2/FLU/RSV testing.  Fact Sheet for Patients: BloggerCourse.com  Fact Sheet for Healthcare Providers: SeriousBroker.it  This test is not yet approved or cleared by the [place] FDA and has been authorized for detection and/or diagnosis of SARS-CoV-2 by FDA under an Emergency Use Authorization (EUA). This EUA will remain in effect (meaning this test can be used) for the duration of the COVID-19 declaration under Section 564(b)(1) of the Act, 21 U.S.C. section 360bbb-3(b)(1), unless the authorization is terminated or revoked.     Resp Syncytial Virus by PCR NEGATIVE NEGATIVE Final    Comment: (NOTE) Fact Sheet for Patients: BloggerCourse.com  Fact Sheet for Healthcare Providers: SeriousBroker.it  This test is not yet approved or cleared by the [place] FDA  and has been authorized for detection and/or diagnosis of SARS-CoV-2 by FDA under an Emergency Use Authorization (EUA). This EUA will remain in effect (meaning this test can be used) for the duration of the COVID-19 declaration under Section 564(b)(1) of the Act, 21 U.S.C. section 360bbb-3(b)(1), unless the authorization is terminated or revoked.  Performed at [facility], [address] [place], [place] [address]   Blood Culture (routine x 2)     Status: None   Collection Time: 01/06/23  3:43 PM   Specimen: BLOOD LEFT FOREARM  Result Value Ref Range Status   Specimen Description BLOOD LEFT FOREARM  Final   Special Requests   Final    BOTTLES DRAWN AEROBIC AND ANAEROBIC Blood Culture adequate volume   Culture   Final    NO GROWTH 5 DAYS Performed at [facility], [address] [place], [place] [address]    Report Status 01/11/2023 FINAL  Final  Blood Culture (routine x 2)     Status: None   Collection Time: 01/06/23  3:55 PM   Specimen: BLOOD  Result Value Ref Range Status   Specimen Description BLOOD  RIGHT ANTECUBITAL  Final   Special Requests   Final    BOTTLES DRAWN AEROBIC AND ANAEROBIC Blood Culture adequate volume   Culture   Final    NO GROWTH 5 DAYS Performed at [facility], [address] [place], [place] [address]    Report Status 01/11/2023 FINAL  Final     Time coordinating discharge: Over 30 minutes  SIGNED:   Azucena Fallen, DO [facility] 01/11/2023, 11:29 AM Pager   If 7PM-7AM, please contact night-coverage [email or web address]

## 2023-01-11 NOTE — Plan of Care (Signed)

## 2023-01-11 NOTE — Plan of Care (Signed)

## 2023-01-12 DIAGNOSIS — I82492 Acute embolism and thrombosis of other specified deep vein of left lower extremity: Secondary | ICD-10-CM | POA: Diagnosis not present

## 2023-01-12 DIAGNOSIS — N183 Chronic kidney disease, stage 3 unspecified: Secondary | ICD-10-CM | POA: Diagnosis not present

## 2023-01-12 DIAGNOSIS — I428 Other cardiomyopathies: Secondary | ICD-10-CM | POA: Diagnosis not present

## 2023-01-12 DIAGNOSIS — M069 Rheumatoid arthritis, unspecified: Secondary | ICD-10-CM | POA: Diagnosis not present

## 2023-01-12 DIAGNOSIS — S51811A Laceration without foreign body of right forearm, initial encounter: Secondary | ICD-10-CM | POA: Diagnosis not present

## 2023-01-12 DIAGNOSIS — D72829 Elevated white blood cell count, unspecified: Secondary | ICD-10-CM | POA: Diagnosis not present

## 2023-01-12 DIAGNOSIS — S52201D Unspecified fracture of shaft of right ulna, subsequent encounter for closed fracture with routine healing: Secondary | ICD-10-CM | POA: Diagnosis not present

## 2023-01-12 DIAGNOSIS — E039 Hypothyroidism, unspecified: Secondary | ICD-10-CM | POA: Diagnosis not present

## 2023-01-12 DIAGNOSIS — R911 Solitary pulmonary nodule: Secondary | ICD-10-CM | POA: Diagnosis not present

## 2023-01-12 DIAGNOSIS — R41841 Cognitive communication deficit: Secondary | ICD-10-CM | POA: Diagnosis not present

## 2023-01-12 DIAGNOSIS — E785 Hyperlipidemia, unspecified: Secondary | ICD-10-CM | POA: Diagnosis not present

## 2023-01-12 DIAGNOSIS — R52 Pain, unspecified: Secondary | ICD-10-CM | POA: Diagnosis not present

## 2023-01-12 DIAGNOSIS — R112 Nausea with vomiting, unspecified: Secondary | ICD-10-CM | POA: Diagnosis not present

## 2023-01-12 DIAGNOSIS — I10 Essential (primary) hypertension: Secondary | ICD-10-CM | POA: Diagnosis not present

## 2023-01-12 DIAGNOSIS — M25522 Pain in left elbow: Secondary | ICD-10-CM | POA: Diagnosis not present

## 2023-01-12 DIAGNOSIS — Z950 Presence of cardiac pacemaker: Secondary | ICD-10-CM | POA: Diagnosis not present

## 2023-01-12 DIAGNOSIS — I442 Atrioventricular block, complete: Secondary | ICD-10-CM | POA: Diagnosis not present

## 2023-01-12 DIAGNOSIS — K219 Gastro-esophageal reflux disease without esophagitis: Secondary | ICD-10-CM | POA: Diagnosis not present

## 2023-01-12 DIAGNOSIS — R1311 Dysphagia, oral phase: Secondary | ICD-10-CM | POA: Diagnosis not present

## 2023-01-12 DIAGNOSIS — K529 Noninfective gastroenteritis and colitis, unspecified: Secondary | ICD-10-CM | POA: Diagnosis not present

## 2023-01-12 DIAGNOSIS — Z7401 Bed confinement status: Secondary | ICD-10-CM | POA: Diagnosis not present

## 2023-01-12 DIAGNOSIS — E860 Dehydration: Secondary | ICD-10-CM | POA: Diagnosis not present

## 2023-01-12 DIAGNOSIS — R531 Weakness: Secondary | ICD-10-CM | POA: Diagnosis not present

## 2023-01-12 DIAGNOSIS — R2689 Other abnormalities of gait and mobility: Secondary | ICD-10-CM | POA: Diagnosis not present

## 2023-01-12 DIAGNOSIS — S53124D Posterior dislocation of right ulnohumeral joint, subsequent encounter: Secondary | ICD-10-CM | POA: Diagnosis not present

## 2023-01-12 DIAGNOSIS — R600 Localized edema: Secondary | ICD-10-CM | POA: Diagnosis not present

## 2023-01-12 DIAGNOSIS — I5040 Unspecified combined systolic (congestive) and diastolic (congestive) heart failure: Secondary | ICD-10-CM | POA: Diagnosis not present

## 2023-01-12 DIAGNOSIS — M25521 Pain in right elbow: Secondary | ICD-10-CM | POA: Diagnosis not present

## 2023-01-12 DIAGNOSIS — E46 Unspecified protein-calorie malnutrition: Secondary | ICD-10-CM | POA: Diagnosis not present

## 2023-01-12 DIAGNOSIS — D649 Anemia, unspecified: Secondary | ICD-10-CM | POA: Diagnosis not present

## 2023-01-12 DIAGNOSIS — R2681 Unsteadiness on feet: Secondary | ICD-10-CM | POA: Diagnosis not present

## 2023-01-12 DIAGNOSIS — R1312 Dysphagia, oropharyngeal phase: Secondary | ICD-10-CM | POA: Diagnosis not present

## 2023-01-12 DIAGNOSIS — M6281 Muscle weakness (generalized): Secondary | ICD-10-CM | POA: Diagnosis not present

## 2023-01-12 DIAGNOSIS — R109 Unspecified abdominal pain: Secondary | ICD-10-CM | POA: Diagnosis not present

## 2023-01-12 DIAGNOSIS — M79601 Pain in right arm: Secondary | ICD-10-CM | POA: Diagnosis not present

## 2023-01-12 NOTE — Plan of Care (Signed)

## 2023-01-12 NOTE — Discharge Summary (Signed)
Physician Discharge Summary  Pamela Dunn MVH:[id number] DOB: 12/09/1931 DOA: 01/06/2023  PCP: Irena Reichmann, DO  Admit date: 01/06/2023 Discharge date: 01/12/2023  Admitted From: Home Disposition: SNF  Recommendations for Outpatient Follow-up:  Follow up with PCP in 1-2 weeks Follow-up outpatient with PCP for nonemergent outpatient chest CT to evaluate for other nodules. Follow-up with rheumatology as scheduled in the next 1 to 2 weeks Follow-up with orthopedic surgery in 1 week  Discharge Condition: Stable CODE STATUS: Full Diet recommendation: As tolerated  Brief/Interim Summary: Pamela Dunn is a 89 y.o. female with medical history significant of rheumatoid arthritis, LBBB, chronic combined CHF/nonischemic cardiomyopathy, hypertension, hyperlipidemia, CKD stage IIIa, complete heart block status post PPM, hypothyroidism, history of SBO status post small bowel resection in June 2021, GERD presented to the ED for evaluation of nausea, vomiting, generalized weakness, and hypotension.  Initial imaging at intake of the right elbow showed posterior displacement of the ulna with large 3.7 cm osseous fragment anterior to the humerus concerning for age-indeterminate fracture.  Hospitalist called for admission, Ortho called in consult.   Patient evaluated by orthopedic surgery, no indication for further imaging or procedure at this time, presumed changes on x-ray are secondary to inflammatory arthritis.  Patient otherwise medically stable for discharge however continues to have profound ambulatory dysfunction and weakness in the setting of recent GI illness and arm pain.  PT OT currently recommending placement at skilled nursing facility for ongoing therapy.  Patient remains medically stable for discharge, awaiting safe disposition at skilled nursing facility.  Patient has been offered a bed and has insurance approval as of 01/09/2023 but bed is apparently not available until Monday, 01/12/2023 -  patient discharged per prior discussion with SNF.  Discharge Diagnoses:  Principal Problem:   Colitis Active Problems:   Rheumatoid arthritis (HCC)   Chronic diastolic CHF (congestive heart failure) (HCC)   Hypoglycemia   Incidental pulmonary nodule   Fracture of upper arm   Suspected colitis/diverticulitis, resolving Rule out partial small bowel obstruction -CT concerning for partial small bowel obstruction, versus inflammation consistent with colitis/diverticulitis -Ceftriaxone/Flagyl completed -Patient's GI symptoms resolved rapidly, continue to advance diet as tolerated-Continue supportive care/bowel regimen as needed   Advanced rheumatoid arthritis, intractable diffuse joint pain Posterior displacement of right ulna/osseous fragment noted -Orthopedic surgery consulted given abnormal imaging, notably chronic changes from inflammatory arthritis per their evaluation, no indication for further imaging or procedure at this time -Pain currently well-controlled, wrapped and secured at this time with splint -Follow-up in 1 week with orthopedic surgery as discussed; follow-up with rheumatology as scheduled   Hypoglycemia, resolved -In the setting of poor p.o. intake/n.p.o. status -Taking p.o. well, IV fluids discontinued   Incidental pulmonary nodules - CT abdomen pelvis showing 6 mm and 5 mm right lung base nodules which appear stable since 09/04/2022 but new since 2021.  Follow-up outpatient with PCP for nonemergent outpatient chest CT to evaluate for other nodules.   Chronic CHF/nonischemic cardiomyopathy - Echo done in April 2023 showing EF 57%, grade 1 diastolic dysfunction, moderate tricuspid regurgitation. No indication for repeat echo at this time, patient euvolemic and asymptomatic   Hyperlipidemia - Continue Zetia   Hypothyroidism Continue Synthroid   Rheumatoid arthritis Continue prednisone and sulfasalazine   Hypertension Hold antihypertensives at this time and  monitor blood pressure closely.  Discharge Instructions  Discharge Instructions     Discharge patient   Complete by: As directed    Discharge disposition: 03-Skilled Nursing Facility   Discharge patient date:  01/09/2023      Allergies as of 01/12/2023       Reactions   Coreg [carvedilol] Diarrhea   Lorazepam Diarrhea        Medication List     TAKE these medications    Aspirin-Caffeine 500-32.5 MG Tabs Take 1 tablet by mouth in the morning and at bedtime. [employer]   cholecalciferol 1000 units tablet Commonly known as: VITAMIN D Take 1,000 Units by mouth daily.   CoQ10 200 MG Caps Take 200 mg by mouth daily.   diclofenac Sodium 1 % Gel Commonly known as: VOLTAREN Apply 2 g topically 4 (four) times daily.   ezetimibe 10 MG tablet Commonly known as: ZETIA Take 10 mg by mouth daily.   isosorbide-hydrALAZINE 20-37.5 MG tablet Commonly known as: BIDIL TAKE 1 TABLET THREE TIMES DAILY What changed: when to take this   levothyroxine 25 MCG tablet Commonly known as: SYNTHROID Take 25 mcg by mouth daily.   metoprolol succinate 25 MG 24 hr tablet Commonly known as: TOPROL-XL Take 1 tablet (25 mg total) by mouth daily.   OMEGA-3 FISH OIL PO Take 1 capsule by mouth in the morning and at bedtime.   predniSONE 5 MG tablet Commonly known as: DELTASONE Take 5 mg by mouth daily.   sulfaSALAzine 500 MG tablet Commonly known as: AZULFIDINE Take 500 mg by mouth daily.        Allergies  Allergen Reactions   Coreg [Carvedilol] Diarrhea   Lorazepam Diarrhea    Consultations: Orthopedic surgery  Procedures/Studies: CT ABDOMEN PELVIS WO CONTRAST  Result Date: 01/06/2023 CLINICAL DATA:  Hypotension, nausea, and suspected small-bowel obstruction. Previous history of small-bowel obstruction. Questionable sepsis. EXAM: CT ABDOMEN AND PELVIS WITHOUT CONTRAST TECHNIQUE: Multidetector CT imaging of the abdomen and pelvis was performed following the  standard protocol without IV contrast. RADIATION DOSE REDUCTION: This exam was performed according to the departmental dose-optimization program which includes automated exposure control, adjustment of the mA and/or kV according to patient size and/or use of iterative reconstruction technique. COMPARISON:  CTs with IV contrast 09/04/2022 and 08/23/2019. FINDINGS: Lower chest: There are trace pleural effusions. Subpleural linear atelectasis in the lower lobes. Mild chronic elevation right hemidiaphragm. New from 2021 but stable since 09/04/2022, there is a 6 mm right middle lobe noncalcified nodule on 4:2, a 5 mm stable noncalcified right lower lobe anterior basal segment nodule on 4:9, and adjacent 2 mm subpleural right middle lobe nodules on 4:7. Four months of stability is confirmed but there could be other nodules above the plane of current and prior imaging. Chest CT is recommended to assess for additional nodules. No focal consolidation is seen. There is mild diffuse bronchial thickening. Mild cardiomegaly is unchanged. No pericardial effusion. There is metal artifact from pacemaker wiring in the right heart. Coronary artery calcifications. Small hiatal hernia. Hepatobiliary: No liver masses seen without contrast. There are occasional calcified granulomas. There are they few small stones layering in the proximal gallbladder but no wall thickening no biliary dilatation. Pancreas: Partially atrophic. Otherwise unremarkable without contrast. Spleen: No abnormality is seen without contrast. Adrenals/Urinary Tract: There is no adrenal mass. Bilateral renal cortical thinning is again noted. A 1.7 cm Bosniak 1 cyst is again seen in the midpole left kidney anteriorly, Hounsfield density is 2. No follow-up imaging is recommended. No other contour deforming deformity abnormality is seen of the kidneys. There is no urinary stone or obstruction. The bladder is unremarkable for the degree of distention. Stomach/Bowel:  Interval increased  thickening at the GE junction warranting endoscopic follow-up. Rest of the gastric wall is contracted. There is a mildly dilated postsurgical small bowel segment in the midline to right paramidline lower abdomen, measuring 3.6 cm caliber. Remainder of the small bowel is normal caliber. This could be due to a low-grade partial small bowel obstruction along the surgical segment or possibly due to ileus. An appendix is not seen in this patient. There is either wall thickening or underdistention in the proximal transverse colon to the midline, with colitis favored as there are hazy reactive mesenteric inflammatory changes in the area. There is mild fecal stasis in the distal transverse, descending and rectosigmoid colon, without evidence of colitis. There is uncomplicated sigmoid diverticulitis. A moderate-sized stool ball distends the rectum but there is no wall thickening. Vascular/Lymphatic: Extensive aortoiliac and visceral abdominal arterial calcific plaques. No AAA. No adenopathy is seen. Reproductive: Uterus and bilateral adnexa are unremarkable. Other: Small umbilical fat hernia. No incarcerated hernia. Trace ascites in the posterior pelvis and subhepatic space. No drainable pocket is seen. There is no free air, free hemorrhage or localizing collection. Musculoskeletal: Osteopenia and advanced degenerative change lower thoracic and lumbar spine. Bone-on-bone superior joint space loss of both hips with acetabular osteophytes. Acquired spinal stenosis again noted L1-2, L2-3, and L3-4 with multilevel lumbar foraminal stenosis. No suspicious regional osseous lesions. IMPRESSION: 1. Mildly dilated postsurgical small bowel segment in the midline to right paramidline lower abdomen, measuring 3.6 cm caliber. This could be due to a low-grade partial small bowel obstruction along the surgical segment or possibly due to ileus. 2. Proximal to mid transverse colon wall thickening or underdistention, with  colitis favored. 3. Constipation and uncomplicated sigmoid diverticulitis. 4. Trace ascites. No drainable pocket. 5. Trace pleural effusions with bibasilar atelectasis. 6. Cholelithiasis. 7. Aortic and coronary artery atherosclerosis. 8. Osteopenia and advanced degenerative change. 9. 6 mm and 5 mm right lung base nodules are stable since 09/04/2022 but new since 2021. Nonemergent chest CT is recommended to look for other nodules. Aortic Atherosclerosis (ICD10-I70.0). Electronically Signed   By: Almira Bar M.D.   On: 01/06/2023 21:03   DG Elbow Complete Left  Result Date: 01/06/2023 CLINICAL DATA:  Hypotension. Possible fall. Large skin tear to right forearm. Unclear etiology. Unable to right elbow. EXAM: LEFT ELBOW - COMPLETE 3+ VIEW; RIGHT ELBOW - COMPLETE 3+ VIEW COMPARISON:  Radiographs 01/06/2009 FINDINGS: Right: Severe erosive arthritic changes are redemonstrated. There is posterior displacement of the ulna in relation to the humerus. Large 3.7 cm osseous fragment anterior to the humerus compatible with age-indeterminate fracture fragment. Left: Severe erosive changes. Plate and screw fixation distal humerus. No definite acute fracture. No dislocation. IMPRESSION: 1. Posterior displacement of the right ulna in relation to the humerus. 2. Large 3.7 cm osseous fragment anterior to the right humerus compatible with age-indeterminate fracture fragment. 3. Severe erosive arthritic changes bilaterally. Electronically Signed   By: Minerva Fester M.D.   On: 01/06/2023 20:08   DG Elbow Complete Right  Result Date: 01/06/2023 CLINICAL DATA:  Hypotension. Possible fall. Large skin tear to right forearm. Unclear etiology. Unable to right elbow. EXAM: LEFT ELBOW - COMPLETE 3+ VIEW; RIGHT ELBOW - COMPLETE 3+ VIEW COMPARISON:  Radiographs 01/06/2009 FINDINGS: Right: Severe erosive arthritic changes are redemonstrated. There is posterior displacement of the ulna in relation to the humerus. Large 3.7 cm osseous  fragment anterior to the humerus compatible with age-indeterminate fracture fragment. Left: Severe erosive changes. Plate and screw fixation distal humerus. No definite acute  fracture. No dislocation. IMPRESSION: 1. Posterior displacement of the right ulna in relation to the humerus. 2. Large 3.7 cm osseous fragment anterior to the right humerus compatible with age-indeterminate fracture fragment. 3. Severe erosive arthritic changes bilaterally. Electronically Signed   By: Minerva Fester M.D.   On: 01/06/2023 20:08   DG Chest Port 1 View  Result Date: 01/06/2023 CLINICAL DATA:  Questionable sepsis-evaluate for abnormality, nausea, vomiting, weakness, hypotension EXAM: PORTABLE CHEST 1 VIEW COMPARISON:  03/04/2022 FINDINGS: Stable cardiomegaly. Aortic atherosclerotic calcification. Left midlung scarring. Left basilar atelectasis. No focal consolidation, pleural effusion, or pneumothorax. No displaced rib fractures. Left chest wall pacemaker. Left TSA. IMPRESSION: No active disease. Electronically Signed   By: Minerva Fester M.D.   On: 01/06/2023 20:03   CT Head Wo Contrast  Result Date: 01/06/2023 CLINICAL DATA:  Head trauma EXAM: CT HEAD WITHOUT CONTRAST TECHNIQUE: Contiguous axial images were obtained from the base of the skull through the vertex without intravenous contrast. RADIATION DOSE REDUCTION: This exam was performed according to the departmental dose-optimization program which includes automated exposure control, adjustment of the mA and/or kV according to patient size and/or use of iterative reconstruction technique. COMPARISON:  None Available. FINDINGS: Brain: There is no mass, hemorrhage or extra-axial collection. There is generalized atrophy without lobar predilection. Hypodensity of the white matter is most commonly associated with chronic microvascular disease. Old anterior right MCA territory infarct. Vascular: Atherosclerotic calcification of the vertebral and internal carotid arteries  at the skull base. No abnormal hyperdensity of the major intracranial arteries or dural venous sinuses. Skull: The visualized skull base, calvarium and extracranial soft tissues are normal. Sinuses/Orbits: No fluid levels or advanced mucosal thickening of the visualized paranasal sinuses. No mastoid or middle ear effusion. Normal orbits. IMPRESSION: 1. No acute intracranial abnormality. 2. Old anterior right MCA territory infarct and findings of chronic microvascular disease. Electronically Signed   By: Deatra Robinson M.D.   On: 01/06/2023 19:08     Subjective: No acute issues or events overnight   Discharge Exam: Vitals:   01/11/23 1935 01/12/23 0400  Pulse: 81 80  Resp:  18  Temp: 98.3 F (36.8 C) 98 F (36.7 C)  SpO2: 97% 99%   Vitals:   01/11/23 0734 01/11/23 1639 01/11/23 1935 01/12/23 0400  Pulse: 80 83 81 80  Resp: 16 15  18   Temp: 97.9 F (36.6 C) 98.7 F (37.1 C) 98.3 F (36.8 C) 98 F (36.7 C)  TempSrc: Oral Oral Oral   SpO2: 96% 97% 97% 99%  Weight:      Height:        General: Pt is alert, awake, not in acute distress Cardiovascular: RRR, S1/S2 +, no rubs, no gallops Respiratory: CTA bilaterally, no wheezing, no rhonchi Abdominal: Soft, NT, ND, bowel sounds + Extremities: Right arm below bandage clean dry intact    The results of significant diagnostics from this hospitalization (including imaging, microbiology, ancillary and laboratory) are listed below for reference.     Microbiology: Recent Results (from the past 240 hour(s))  Resp panel by RT-PCR (RSV, Flu A&B, Covid)     Status: None   Collection Time: 01/06/23  3:43 PM   Specimen: Nasal Swab  Result Value Ref Range Status   SARS Coronavirus 2 by RT PCR NEGATIVE NEGATIVE Final   Influenza A by PCR NEGATIVE NEGATIVE Final   Influenza B by PCR NEGATIVE NEGATIVE Final    Comment: (NOTE) The Xpert Xpress SARS-CoV-2/FLU/RSV plus assay is intended as an aid  in the diagnosis of influenza from  Nasopharyngeal swab specimens and should not be used as a sole basis for treatment. Nasal washings and aspirates are unacceptable for Xpert Xpress SARS-CoV-2/FLU/RSV testing.  Fact Sheet for Patients: BloggerCourse.com  Fact Sheet for Healthcare Providers: SeriousBroker.it  This test is not yet approved or cleared by the [place] FDA and has been authorized for detection and/or diagnosis of SARS-CoV-2 by FDA under an Emergency Use Authorization (EUA). This EUA will remain in effect (meaning this test can be used) for the duration of the COVID-19 declaration under Section 564(b)(1) of the Act, 21 U.S.C. section 360bbb-3(b)(1), unless the authorization is terminated or revoked.     Resp Syncytial Virus by PCR NEGATIVE NEGATIVE Final    Comment: (NOTE) Fact Sheet for Patients: BloggerCourse.com  Fact Sheet for Healthcare Providers: SeriousBroker.it  This test is not yet approved or cleared by the [place] FDA and has been authorized for detection and/or diagnosis of SARS-CoV-2 by FDA under an Emergency Use Authorization (EUA). This EUA will remain in effect (meaning this test can be used) for the duration of the COVID-19 declaration under Section 564(b)(1) of the Act, 21 U.S.C. section 360bbb-3(b)(1), unless the authorization is terminated or revoked.  Performed at [facility], [address] [place], [place] [address]   Blood Culture (routine x 2)     Status: None   Collection Time: 01/06/23  3:43 PM   Specimen: BLOOD LEFT FOREARM  Result Value Ref Range Status   Specimen Description BLOOD LEFT FOREARM  Final   Special Requests   Final    BOTTLES DRAWN AEROBIC AND ANAEROBIC Blood Culture adequate volume   Culture   Final    NO GROWTH 5 DAYS Performed at [facility], [address], [place], [place] [address]    Report Status 01/11/2023 FINAL  Final   Blood Culture (routine x 2)     Status: None   Collection Time: 01/06/23  3:55 PM   Specimen: BLOOD  Result Value Ref Range Status   Specimen Description BLOOD RIGHT ANTECUBITAL  Final   Special Requests   Final    BOTTLES DRAWN AEROBIC AND ANAEROBIC Blood Culture adequate volume   Culture   Final    NO GROWTH 5 DAYS Performed at [facility], [address], [place], [place] [address]    Report Status 01/11/2023 FINAL  Final     Labs: BNP (last 3 results) Recent Labs    01/07/23 0738  BNP 385.1*   Basic Metabolic Panel: Recent Labs  Lab 01/06/23 1543 01/07/23 0738  NA 139 136  K 3.9 4.0  CL 102 102  CO2 26 23  GLUCOSE 65* 74  BUN 22 23  CREATININE 1.31* 1.12*  CALCIUM 8.4* 8.2*   Liver Function Tests: Recent Labs  Lab 01/06/23 1543  AST 27  ALT 9  ALKPHOS 42  BILITOT 1.0  PROT 5.0*  ALBUMIN 2.7*   No results for input(s): "LIPASE", "AMYLASE" in the last 168 hours. No results for input(s): "AMMONIA" in the last 168 hours. CBC: Recent Labs  Lab 01/06/23 1543 01/07/23 0738  WBC 15.5* 15.6*  NEUTROABS 13.4*  --   HGB 11.7* 11.2*  HCT 36.7 33.0*  MCV 94.6 92.4  PLT 281 241   CBG: Recent Labs  Lab 01/07/23 0807 01/07/23 1701 01/07/23 2020 01/08/23 0417 01/09/23 0750  GLUCAP 65* 118* 119* 89 109*   Urinalysis    Component Value Date/Time  COLORURINE YELLOW 01/06/2023 2209   APPEARANCEUR CLEAR 01/06/2023 2209   LABSPEC 1.013 01/06/2023 2209   PHURINE 6.0 01/06/2023 2209   GLUCOSEU NEGATIVE 01/06/2023 2209   HGBUR NEGATIVE 01/06/2023 2209   BILIRUBINUR NEGATIVE 01/06/2023 2209   KETONESUR NEGATIVE 01/06/2023 2209   PROTEINUR NEGATIVE 01/06/2023 2209   NITRITE NEGATIVE 01/06/2023 2209   LEUKOCYTESUR NEGATIVE 01/06/2023 2209   Sepsis Labs Recent Labs  Lab 01/06/23 1543 01/07/23 0738  WBC 15.5* 15.6*   Microbiology Recent Results (from the past 240 hour(s))  Resp panel by RT-PCR (RSV, Flu A&B, Covid)     Status: None    Collection Time: 01/06/23  3:43 PM   Specimen: Nasal Swab  Result Value Ref Range Status   SARS Coronavirus 2 by RT PCR NEGATIVE NEGATIVE Final   Influenza A by PCR NEGATIVE NEGATIVE Final   Influenza B by PCR NEGATIVE NEGATIVE Final    Comment: (NOTE) The Xpert Xpress SARS-CoV-2/FLU/RSV plus assay is intended as an aid in the diagnosis of influenza from Nasopharyngeal swab specimens and should not be used as a sole basis for treatment. Nasal washings and aspirates are unacceptable for Xpert Xpress SARS-CoV-2/FLU/RSV testing.  Fact Sheet for Patients: BloggerCourse.com  Fact Sheet for Healthcare Providers: SeriousBroker.it  This test is not yet approved or cleared by the [place] FDA and has been authorized for detection and/or diagnosis of SARS-CoV-2 by FDA under an Emergency Use Authorization (EUA). This EUA will remain in effect (meaning this test can be used) for the duration of the COVID-19 declaration under Section 564(b)(1) of the Act, 21 U.S.C. section 360bbb-3(b)(1), unless the authorization is terminated or revoked.     Resp Syncytial Virus by PCR NEGATIVE NEGATIVE Final    Comment: (NOTE) Fact Sheet for Patients: BloggerCourse.com  Fact Sheet for Healthcare Providers: SeriousBroker.it  This test is not yet approved or cleared by the [place] FDA and has been authorized for detection and/or diagnosis of SARS-CoV-2 by FDA under an Emergency Use Authorization (EUA). This EUA will remain in effect (meaning this test can be used) for the duration of the COVID-19 declaration under Section 564(b)(1) of the Act, 21 U.S.C. section 360bbb-3(b)(1), unless the authorization is terminated or revoked.  Performed at [facility], [address], [place], [place] [address]   Blood Culture (routine x 2)     Status: None   Collection Time: 01/06/23  3:43 PM    Specimen: BLOOD LEFT FOREARM  Result Value Ref Range Status   Specimen Description BLOOD LEFT FOREARM  Final   Special Requests   Final    BOTTLES DRAWN AEROBIC AND ANAEROBIC Blood Culture adequate volume   Culture   Final    NO GROWTH 5 DAYS Performed at [facility], [address] [place], [place] [address]    Report Status 01/11/2023 FINAL  Final  Blood Culture (routine x 2)     Status: None   Collection Time: 01/06/23  3:55 PM   Specimen: BLOOD  Result Value Ref Range Status   Specimen Description BLOOD RIGHT ANTECUBITAL  Final   Special Requests   Final    BOTTLES DRAWN AEROBIC AND ANAEROBIC Blood Culture adequate volume   Culture   Final    NO GROWTH 5 DAYS Performed at [facility], [address] [place], [place] [address]    Report Status 01/11/2023 FINAL  Final   Time coordinating discharge: Over 30 minutes  SIGNED:  Azucena Fallen, DO [facility]  01/12/2023, 7:03 AM Pager   If 7PM-7AM, please contact night-coverage [email or web address]

## 2023-01-12 NOTE — Care Management Important Message (Signed)
Important Message  Patient Details  Name: Pamela Dunn MRN: [id number] Date of Birth: [birth year]   Important Message Given:  Yes - Medicare IM  Patient left prior to IM delivery will send a copy to the patient home address.     Ashdon Gillson 01/12/2023, 3:45 PM

## 2023-01-12 NOTE — Care Management Important Message (Signed)
Important Message  Patient Details  Name: Pamela Dunn MRN: [id number] Date of Birth: April 27, 1931   Important Message Given:    PT'S HAS DISCHARGE. IM MAILED    Mardene Sayer 01/12/2023, 3:08 PM

## 2023-01-12 NOTE — TOC Transition Note (Signed)
Transition of Care [facility]) - CM/SW Discharge Note   Patient Details  Name: Pamela Dunn MRN: [id number] Date of Birth: 07-Nov-1931  Transition of Care [facility]) CM/SW Contact:  Bibi Economos A [place], LCSWA Phone Number: 01/12/2023, 11:01 AM   Clinical Narrative:     Patient will DC to: [facility]  Anticipated DC date: 01/12/23  Family notified: Lenard Simmer  Transport bySharin Mons    Reference ID [id number] Auth ID [id number]  Approval Dates:  01/10/2023-01/13/2023  Per MD patient ready for DC to [facility]. RN, patient, patient's family, and facility notified of DC. Discharge Summary and FL2 sent to facility. RN to call report prior to discharge (room 1206p, [phone] ). DC packet on chart. Ambulance transport requested for patient.     CSW will sign off for now as social work intervention is no longer needed. Please consult [place] again if new needs arise.   Final next level of care: Skilled Nursing Facility Barriers to Discharge: Barriers Resolved   Patient Goals and CMS Choice      Discharge Placement                Patient chooses bed at: [facility] Patient to be transferred to facility by: [facility] Name of family member notified: Jamielynn Wigley Patient and family notified of of transfer: 01/12/23  Discharge Plan and Services Additional resources added to the After Visit Summary for                                       Social Determinants of Health (SDOH) Interventions SDOH Screenings   Food Insecurity: No Food Insecurity (01/07/2023)  Housing: Low Risk  (01/07/2023)  Transportation Needs: No Transportation Needs (01/07/2023)  Utilities: Not At Risk (01/07/2023)  Tobacco Use: Low Risk  (01/06/2023)     Readmission Risk Interventions    07/13/2020   10:18 AM  Readmission Risk Prevention Plan  Transportation Screening Complete  PCP or Specialist Appt within 5-7 Days Complete  Home Care Screening Complete  Medication  Review (RN CM) Complete

## 2023-01-12 NOTE — Progress Notes (Signed)
Report given to [place] at [facility]. All questions answered. Pt transported via stretcher with transport personnel. All belonging removed from room by son.

## 2023-01-13 DIAGNOSIS — S52201D Unspecified fracture of shaft of right ulna, subsequent encounter for closed fracture with routine healing: Secondary | ICD-10-CM | POA: Diagnosis not present

## 2023-01-13 DIAGNOSIS — M069 Rheumatoid arthritis, unspecified: Secondary | ICD-10-CM | POA: Diagnosis not present

## 2023-01-13 DIAGNOSIS — E039 Hypothyroidism, unspecified: Secondary | ICD-10-CM | POA: Diagnosis not present

## 2023-01-13 DIAGNOSIS — I5040 Unspecified combined systolic (congestive) and diastolic (congestive) heart failure: Secondary | ICD-10-CM | POA: Diagnosis not present

## 2023-01-15 ENCOUNTER — Ambulatory Visit (INDEPENDENT_AMBULATORY_CARE_PROVIDER_SITE_OTHER): Payer: Medicare PPO

## 2023-01-15 DIAGNOSIS — S53124D Posterior dislocation of right ulnohumeral joint, subsequent encounter: Secondary | ICD-10-CM | POA: Diagnosis not present

## 2023-01-15 DIAGNOSIS — E785 Hyperlipidemia, unspecified: Secondary | ICD-10-CM | POA: Diagnosis not present

## 2023-01-15 DIAGNOSIS — R911 Solitary pulmonary nodule: Secondary | ICD-10-CM | POA: Diagnosis not present

## 2023-01-15 DIAGNOSIS — M79601 Pain in right arm: Secondary | ICD-10-CM | POA: Diagnosis not present

## 2023-01-15 DIAGNOSIS — M069 Rheumatoid arthritis, unspecified: Secondary | ICD-10-CM | POA: Diagnosis not present

## 2023-01-15 DIAGNOSIS — R600 Localized edema: Secondary | ICD-10-CM | POA: Diagnosis not present

## 2023-01-15 DIAGNOSIS — I5040 Unspecified combined systolic (congestive) and diastolic (congestive) heart failure: Secondary | ICD-10-CM | POA: Diagnosis not present

## 2023-01-15 DIAGNOSIS — I442 Atrioventricular block, complete: Secondary | ICD-10-CM | POA: Diagnosis not present

## 2023-01-15 DIAGNOSIS — I428 Other cardiomyopathies: Secondary | ICD-10-CM | POA: Diagnosis not present

## 2023-01-15 DIAGNOSIS — K529 Noninfective gastroenteritis and colitis, unspecified: Secondary | ICD-10-CM | POA: Diagnosis not present

## 2023-01-16 LAB — CUP PACEART REMOTE DEVICE CHECK
Battery Remaining Longevity: 85 mo
Battery Remaining Percentage: 80 %
Battery Voltage: 3.01 V
Brady Statistic AP VP Percent: 66 %
Brady Statistic AP VS Percent: 1 %
Brady Statistic AS VP Percent: 34 %
Brady Statistic AS VS Percent: 1 %
Brady Statistic RA Percent Paced: 65 %
Brady Statistic RV Percent Paced: 99 %
Date Time Interrogation Session: 20241107020014
Implantable Lead Connection Status: 753985
Implantable Lead Connection Status: 753985
Implantable Lead Implant Date: 20220505
Implantable Lead Implant Date: 20220505
Implantable Lead Location: 753859
Implantable Lead Location: 753860
Implantable Pulse Generator Implant Date: 20220505
Lead Channel Impedance Value: 390 Ohm
Lead Channel Impedance Value: 510 Ohm
Lead Channel Pacing Threshold Amplitude: 0.625 V
Lead Channel Pacing Threshold Amplitude: 0.75 V
Lead Channel Pacing Threshold Pulse Width: 0.5 ms
Lead Channel Pacing Threshold Pulse Width: 0.5 ms
Lead Channel Sensing Intrinsic Amplitude: 12 mV
Lead Channel Sensing Intrinsic Amplitude: 2.6 mV
Lead Channel Setting Pacing Amplitude: 1.625
Lead Channel Setting Pacing Amplitude: 2 V
Lead Channel Setting Pacing Pulse Width: 0.5 ms
Lead Channel Setting Sensing Sensitivity: 2 mV
Pulse Gen Model: 2272
Pulse Gen Serial Number: 3920272

## 2023-01-19 DIAGNOSIS — R2681 Unsteadiness on feet: Secondary | ICD-10-CM | POA: Diagnosis not present

## 2023-01-19 DIAGNOSIS — I428 Other cardiomyopathies: Secondary | ICD-10-CM | POA: Diagnosis not present

## 2023-01-19 DIAGNOSIS — R2689 Other abnormalities of gait and mobility: Secondary | ICD-10-CM | POA: Diagnosis not present

## 2023-01-19 DIAGNOSIS — S53124D Posterior dislocation of right ulnohumeral joint, subsequent encounter: Secondary | ICD-10-CM | POA: Diagnosis not present

## 2023-01-19 DIAGNOSIS — M6281 Muscle weakness (generalized): Secondary | ICD-10-CM | POA: Diagnosis not present

## 2023-01-19 DIAGNOSIS — M25521 Pain in right elbow: Secondary | ICD-10-CM | POA: Diagnosis not present

## 2023-01-19 DIAGNOSIS — I5040 Unspecified combined systolic (congestive) and diastolic (congestive) heart failure: Secondary | ICD-10-CM | POA: Diagnosis not present

## 2023-01-19 DIAGNOSIS — M25522 Pain in left elbow: Secondary | ICD-10-CM | POA: Diagnosis not present

## 2023-01-19 DIAGNOSIS — R52 Pain, unspecified: Secondary | ICD-10-CM | POA: Diagnosis not present

## 2023-01-19 DIAGNOSIS — K219 Gastro-esophageal reflux disease without esophagitis: Secondary | ICD-10-CM | POA: Diagnosis not present

## 2023-01-19 DIAGNOSIS — K529 Noninfective gastroenteritis and colitis, unspecified: Secondary | ICD-10-CM | POA: Diagnosis not present

## 2023-01-20 DIAGNOSIS — D72829 Elevated white blood cell count, unspecified: Secondary | ICD-10-CM | POA: Diagnosis not present

## 2023-01-20 DIAGNOSIS — E46 Unspecified protein-calorie malnutrition: Secondary | ICD-10-CM | POA: Diagnosis not present

## 2023-01-20 DIAGNOSIS — M069 Rheumatoid arthritis, unspecified: Secondary | ICD-10-CM | POA: Diagnosis not present

## 2023-01-20 DIAGNOSIS — D649 Anemia, unspecified: Secondary | ICD-10-CM | POA: Diagnosis not present

## 2023-01-21 DIAGNOSIS — R2681 Unsteadiness on feet: Secondary | ICD-10-CM | POA: Diagnosis not present

## 2023-01-21 DIAGNOSIS — R2689 Other abnormalities of gait and mobility: Secondary | ICD-10-CM | POA: Diagnosis not present

## 2023-01-21 DIAGNOSIS — R52 Pain, unspecified: Secondary | ICD-10-CM | POA: Diagnosis not present

## 2023-01-21 DIAGNOSIS — S53124D Posterior dislocation of right ulnohumeral joint, subsequent encounter: Secondary | ICD-10-CM | POA: Diagnosis not present

## 2023-01-21 DIAGNOSIS — M6281 Muscle weakness (generalized): Secondary | ICD-10-CM | POA: Diagnosis not present

## 2023-01-22 DIAGNOSIS — M069 Rheumatoid arthritis, unspecified: Secondary | ICD-10-CM | POA: Diagnosis not present

## 2023-01-22 DIAGNOSIS — E46 Unspecified protein-calorie malnutrition: Secondary | ICD-10-CM | POA: Diagnosis not present

## 2023-01-26 ENCOUNTER — Ambulatory Visit: Payer: Medicare PPO | Attending: Cardiology | Admitting: Cardiology

## 2023-01-26 ENCOUNTER — Encounter: Payer: Self-pay | Admitting: Cardiology

## 2023-01-26 VITALS — HR 60 | Resp 16 | Ht 65.0 in | Wt 131.0 lb

## 2023-01-26 DIAGNOSIS — I442 Atrioventricular block, complete: Secondary | ICD-10-CM | POA: Diagnosis not present

## 2023-01-26 DIAGNOSIS — Z950 Presence of cardiac pacemaker: Secondary | ICD-10-CM | POA: Diagnosis not present

## 2023-01-26 DIAGNOSIS — I10 Essential (primary) hypertension: Secondary | ICD-10-CM

## 2023-01-26 DIAGNOSIS — R600 Localized edema: Secondary | ICD-10-CM | POA: Diagnosis not present

## 2023-01-26 DIAGNOSIS — R2681 Unsteadiness on feet: Secondary | ICD-10-CM | POA: Diagnosis not present

## 2023-01-26 DIAGNOSIS — M6281 Muscle weakness (generalized): Secondary | ICD-10-CM | POA: Diagnosis not present

## 2023-01-26 DIAGNOSIS — S53124D Posterior dislocation of right ulnohumeral joint, subsequent encounter: Secondary | ICD-10-CM | POA: Diagnosis not present

## 2023-01-26 DIAGNOSIS — R2689 Other abnormalities of gait and mobility: Secondary | ICD-10-CM | POA: Diagnosis not present

## 2023-01-26 DIAGNOSIS — R52 Pain, unspecified: Secondary | ICD-10-CM | POA: Diagnosis not present

## 2023-01-26 NOTE — Patient Instructions (Signed)
Medication Instructions:  Your physician recommends that you continue on your current medications as directed. Please refer to the Current Medication list given to you today.  *If you need a refill on your cardiac medications before your next appointment, please call your pharmacy*   Lab Work: none If you have labs (blood work) drawn today and your tests are completely normal, you will receive your results only by: MyChart Message (if you have MyChart) OR A paper copy in the mail If you have any lab test that is abnormal or we need to change your treatment, we will call you to review the results.   Testing/Procedures: Your physician has requested that you have a  upper extremity venous duplex. This test is an ultrasound of the veins in the arms. It looks at venous blood flow that carries blood from the heart to the  arms. . Allow thirty minutes for an Upper Venous exam. There are no restrictions or special instructions.  Please note: We ask at that you not bring children with you during ultrasound (echo/ vascular) testing. Due to room size and safety concerns, children are not allowed in the ultrasound rooms during exams. Our front office staff cannot provide observation of children in our lobby area while testing is being conducted. An adult accompanying a patient to their appointment will only be allowed in the ultrasound room at the discretion of the ultrasound technician under special circumstances. We apologize for any inconvenience.    Follow-Up: At [facility], you and your health needs are our priority.  As part of our continuing mission to provide you with exceptional heart care, we have created designated Provider Care Teams.  These Care Teams include your primary Cardiologist (physician) and Advanced Practice Providers (APPs -  Physician Assistants and Nurse Practitioners) who all work together to provide you with the care you need, when you need it.  We recommend signing  up for the patient portal called "MyChart".  Sign up information is provided on this After Visit Summary.  MyChart is used to connect with patients for Virtual Visits (Telemedicine).  Patients are able to view lab/test results, encounter notes, upcoming appointments, etc.  Non-urgent messages can be sent to your provider as well.   To learn more about what you can do with MyChart, go to [email or web address].    Your next appointment:   As needed  Provider:   Dr Jacinto Halim    Other Instructions You have been referred to Electrophysiology.  Please schedule new patient appointment

## 2023-01-26 NOTE — Progress Notes (Signed)
Cardiology Office Note:  .   Date:  01/26/2023  ID:  Pamela Dunn, DOB 1932-03-08, MRN [id number] PCP: Irena Reichmann, DO  [facility] Providers Cardiologist:  None    History of Present Illness: [name] Kitchen   Pamela Dunn is a 89 y.o. Caucasian  female  with with long-standing history of recurrent rheumatoid arthritis, left bundle branch block, nonischemic cardiomyopathy with negative nuclear stress test and EF 20% in July 2020, EF improved to 45% by medical therapy, latest echo on 2023 revealed normal LVEF.   PMH significant for hypertension, hyperlipidemia, chronic stage IIIa kidney disease, syncope and complete heart block on 07/11/2020 SP Abbott dual-chamber pacemaker implantation on 07/12/2020, severe DJD and RA with arthritis and her physical disability due to arthritis and using a cane to walk, also has pain in her hand joints.   Admitted to the hospital on 01/06/2023 and discharged 5 days later secondary to dehydration, hypotension related to acute diverticulitis of the colon.  This was her third admission in the last 6 months, initial admission and June with acute cystitis and again in August with accidental fall.  Discussed the use of AI scribe software for clinical note transcription with the patient, who gave verbal consent to proceed.  History of Present Illness   The patient, with a history of pacemaker placement for heart block, presents with left arm swelling. She is currently in a rehabilitation facility following a recent hospital admission and is expected to be discharged home soon. She has been ambulating with a walker and has noted swelling in her left arm, extending from her fingers upwards. She is unsure of the cause, but speculates it may be related to arthritis. The swelling was previously managed by an orthopedic doctor who wrapped the arm, leading to some fluid leakage. She also reports inflammation in her right elbow. She has been advised to keep her left arm elevated above  her heart, but finds this difficult due to shoulder discomfort. She sleeps on her back, but has been advised to try sleeping on her right side to aid in fluid drainage from her left arm.      Review of Systems  Cardiovascular:  Negative for chest pain, dyspnea on exertion and leg swelling.  Musculoskeletal:  Positive for joint pain and joint swelling.    Risk Assessment/Calculations:     No BP recorded.  Patient has significant upper extremity deformity and left arm edema and hands blood pressure not checked.       Lab Results  Component Value Date   NA 136 01/07/2023   K 4.0 01/07/2023   CO2 23 01/07/2023   GLUCOSE 74 01/07/2023   BUN 23 01/07/2023   CREATININE 1.12 (H) 01/07/2023   CALCIUM 8.2 (L) 01/07/2023   GFR 51.35 (L) 09/05/2013   GFRNONAA 47 (L) 01/07/2023      Latest Ref Rng & Units 01/07/2023    7:38 AM 01/06/2023    3:43 PM 09/04/2022    1:04 PM  BMP  Glucose 70 - 99 mg/dL 74  65  865   BUN 8 - 23 mg/dL 23  22  23    Creatinine 0.44 - 1.00 mg/dL 7.84  6.96  2.95   Sodium 135 - 145 mmol/L 136  139  131   Potassium 3.5 - 5.1 mmol/L 4.0  3.9  3.8   Chloride 98 - 111 mmol/L 102  102  96   CO2 22 - 32 mmol/L 23  26  26  Calcium 8.9 - 10.3 mg/dL 8.2  8.4  8.6        Latest Ref Rng & Units 01/07/2023    7:38 AM 01/06/2023    3:43 PM 09/04/2022    1:04 PM  CBC  WBC 4.0 - 10.5 K/uL 15.6  15.5  9.0   Hemoglobin 12.0 - 15.0 g/dL 24.4  01.0  27.2   Hematocrit 36.0 - 46.0 % 33.0  36.7  36.1   Platelets 150 - 400 K/uL 241  281  263     Physical Exam:   VS:  Pulse 60   Resp 16   Ht 5\' 5"  (1.651 m)   Wt 131 lb (59.4 kg)   SpO2 97%   BMI 21.80 kg/m    Wt Readings from Last 3 Encounters:  01/26/23 131 lb (59.4 kg)  01/06/23 125 lb (56.7 kg)  09/04/22 148 lb (67.1 kg)     Physical Exam Neck:     Vascular: No carotid bruit or JVD.  Cardiovascular:     Rate and Rhythm: Normal rate and regular rhythm.     Pulses: Intact distal pulses.     Heart sounds:  Normal heart sounds. No murmur heard.    No gallop.  Pulmonary:     Effort: Pulmonary effort is normal.     Breath sounds: Normal breath sounds.  Abdominal:     General: Bowel sounds are normal.     Palpations: Abdomen is soft.  Musculoskeletal:        General: Swelling (left arm edema 2-3+) and deformity (burnt out RA in hands) present.     Right lower leg: No edema.     Left lower leg: No edema.     Studies Reviewed: .    Remote dual-chamber pacemaker check 01/16/2023: Normal pacemaker function.  AP 66%, VP 34%.  Echocardiogram 06/10/2021: Left ventricle cavity is normal in size. Mild concentric hypertrophy of the left ventricle. Abnormal septal wall motion due to left bundle branch block. Normal LV systolic function with EF 57%. Doppler evidence of grade I (impaired) diastolic dysfunction, normal LAP.  Moderate tricuspid regurgitation. Estimated pulmonary artery systolic pressure 27 mmHg. Unlike previous study in 2039m mitral regurgitation not appreciated. No other significant change noted.  EKG:    EKG 11/06/2021: Normal sinus rhythm at the rate of 68 bpm, demand atrially paced rhythm. Underlying left bundle branch block. Occasional PVCs   Current Meds  Medication Sig   Aspirin-Caffeine 500-32.5 MG TABS Take 1 tablet by mouth in the morning and at bedtime. Bayer Back and Body   calcium carbonate (TUMS - DOSED IN MG ELEMENTAL CALCIUM) 500 MG chewable tablet Chew 1 tablet by mouth daily.   cholecalciferol (VITAMIN D) 1000 UNITS tablet Take 1,000 Units by mouth daily.    Coenzyme Q10 (COQ10) 200 MG CAPS Take 200 mg by mouth daily.   diclofenac Sodium (VOLTAREN) 1 % GEL Apply 2 g topically 4 (four) times daily.   ezetimibe (ZETIA) 10 MG tablet Take 10 mg by mouth daily.    isosorbide-hydrALAZINE (BIDIL) 20-37.5 MG tablet TAKE 1 TABLET THREE TIMES DAILY (Patient taking differently: Take 1 tablet by mouth in the morning and at bedtime.)   levothyroxine (SYNTHROID) 25 MCG tablet Take  25 mcg by mouth daily.    metoprolol succinate (TOPROL-XL) 25 MG 24 hr tablet Take 1 tablet (25 mg total) by mouth daily.   Omega-3 Fatty Acids (OMEGA-3 FISH OIL PO) Take 1 capsule by mouth in the morning and at bedtime.  ondansetron (ZOFRAN-ODT) 4 MG disintegrating tablet Take 4 mg by mouth every 8 (eight) hours as needed.   polyethylene glycol (MIRALAX / GLYCOLAX) 17 g packet Take 17 g by mouth daily.   predniSONE (DELTASONE) 5 MG tablet Take 5 mg by mouth daily.   sulfaSALAzine (AZULFIDINE) 500 MG tablet Take 500 mg by mouth daily.     ASSESSMENT AND PLAN: .      ICD-10-CM   1. Heart block AV complete (HCC)  I44.2     2. Pacemaker Abbott Assurity MRI model [id number] 07/12/2020  Z95.0 Ambulatory referral to Cardiac Electrophysiology    3. Primary hypertension  I10     4. Edema of left upper arm  R60.0 VAS [place] UPPER EXTREMITY VENOUS DUPLEX      Assessment and Plan    Left Arm Swelling New onset, possibly due to fluid accumulation. No clear etiology identified. -Order venous duplex ultrasound of left arm to rule out blood clot. -Advise patient to sleep on right side to facilitate fluid drainage from left arm.  Complete heart block SP permanent pacemaker implantation with no recurrence of syncope or near syncope since 07/12/2020.  Reviewed her recent remote transmission. No issues reported since implantation for heart block. -Refer to electrophysiology for routine pacemaker check and future management of the device. -Continue current medications.  Primary hypertension Blood pressure was not checked, however upon review of her recent hospitalization and also prior office visit, blood pressure has been well-controlled.  Could not check her blood pressure today due to upper extremity left arm swelling for which DVT is being ruled out as she also has pacemaker leads in the left subclavian vein, significant deformity right upper extremity from arthritis/burnt out rheumatoid arthritis.  No  changes in the medications were done today.  As she has remained stable from cardiac standpoint with no active cardiac issues except has complete heart block which is now being managed by pacemaker, I will see her back on a as needed basis.  Signed,  Yates Decamp, MD, [facility] 01/26/2023, 6:13 PM [facility] [address] [place], [place] [address] Phone: [phone]. Fax:  [phone]

## 2023-01-27 NOTE — Progress Notes (Signed)
Remote pacemaker transmission.   

## 2023-01-28 DIAGNOSIS — M069 Rheumatoid arthritis, unspecified: Secondary | ICD-10-CM | POA: Diagnosis not present

## 2023-01-28 DIAGNOSIS — M6281 Muscle weakness (generalized): Secondary | ICD-10-CM | POA: Diagnosis not present

## 2023-01-28 DIAGNOSIS — M79601 Pain in right arm: Secondary | ICD-10-CM | POA: Diagnosis not present

## 2023-01-28 DIAGNOSIS — R600 Localized edema: Secondary | ICD-10-CM | POA: Diagnosis not present

## 2023-01-28 DIAGNOSIS — N183 Chronic kidney disease, stage 3 unspecified: Secondary | ICD-10-CM | POA: Diagnosis not present

## 2023-01-28 DIAGNOSIS — S51811A Laceration without foreign body of right forearm, initial encounter: Secondary | ICD-10-CM | POA: Diagnosis not present

## 2023-01-28 DIAGNOSIS — R2681 Unsteadiness on feet: Secondary | ICD-10-CM | POA: Diagnosis not present

## 2023-01-28 DIAGNOSIS — D72829 Elevated white blood cell count, unspecified: Secondary | ICD-10-CM | POA: Diagnosis not present

## 2023-01-28 DIAGNOSIS — R911 Solitary pulmonary nodule: Secondary | ICD-10-CM | POA: Diagnosis not present

## 2023-01-28 DIAGNOSIS — R2689 Other abnormalities of gait and mobility: Secondary | ICD-10-CM | POA: Diagnosis not present

## 2023-01-28 DIAGNOSIS — R52 Pain, unspecified: Secondary | ICD-10-CM | POA: Diagnosis not present

## 2023-01-28 DIAGNOSIS — S53124D Posterior dislocation of right ulnohumeral joint, subsequent encounter: Secondary | ICD-10-CM | POA: Diagnosis not present

## 2023-01-28 DIAGNOSIS — E039 Hypothyroidism, unspecified: Secondary | ICD-10-CM | POA: Diagnosis not present

## 2023-01-30 DIAGNOSIS — D631 Anemia in chronic kidney disease: Secondary | ICD-10-CM | POA: Diagnosis not present

## 2023-01-30 DIAGNOSIS — K529 Noninfective gastroenteritis and colitis, unspecified: Secondary | ICD-10-CM | POA: Diagnosis not present

## 2023-01-30 DIAGNOSIS — M069 Rheumatoid arthritis, unspecified: Secondary | ICD-10-CM | POA: Diagnosis not present

## 2023-01-30 DIAGNOSIS — S53124D Posterior dislocation of right ulnohumeral joint, subsequent encounter: Secondary | ICD-10-CM | POA: Diagnosis not present

## 2023-01-30 DIAGNOSIS — N1831 Chronic kidney disease, stage 3a: Secondary | ICD-10-CM | POA: Diagnosis not present

## 2023-01-30 DIAGNOSIS — S51001D Unspecified open wound of right elbow, subsequent encounter: Secondary | ICD-10-CM | POA: Diagnosis not present

## 2023-01-30 DIAGNOSIS — I130 Hypertensive heart and chronic kidney disease with heart failure and stage 1 through stage 4 chronic kidney disease, or unspecified chronic kidney disease: Secondary | ICD-10-CM | POA: Diagnosis not present

## 2023-01-30 DIAGNOSIS — E46 Unspecified protein-calorie malnutrition: Secondary | ICD-10-CM | POA: Diagnosis not present

## 2023-01-30 DIAGNOSIS — I5042 Chronic combined systolic (congestive) and diastolic (congestive) heart failure: Secondary | ICD-10-CM | POA: Diagnosis not present

## 2023-02-02 DIAGNOSIS — K529 Noninfective gastroenteritis and colitis, unspecified: Secondary | ICD-10-CM | POA: Diagnosis not present

## 2023-02-02 DIAGNOSIS — I130 Hypertensive heart and chronic kidney disease with heart failure and stage 1 through stage 4 chronic kidney disease, or unspecified chronic kidney disease: Secondary | ICD-10-CM | POA: Diagnosis not present

## 2023-02-02 DIAGNOSIS — S51001D Unspecified open wound of right elbow, subsequent encounter: Secondary | ICD-10-CM | POA: Diagnosis not present

## 2023-02-02 DIAGNOSIS — S53124D Posterior dislocation of right ulnohumeral joint, subsequent encounter: Secondary | ICD-10-CM | POA: Diagnosis not present

## 2023-02-02 DIAGNOSIS — D631 Anemia in chronic kidney disease: Secondary | ICD-10-CM | POA: Diagnosis not present

## 2023-02-02 DIAGNOSIS — M069 Rheumatoid arthritis, unspecified: Secondary | ICD-10-CM | POA: Diagnosis not present

## 2023-02-02 DIAGNOSIS — I5042 Chronic combined systolic (congestive) and diastolic (congestive) heart failure: Secondary | ICD-10-CM | POA: Diagnosis not present

## 2023-02-02 DIAGNOSIS — E46 Unspecified protein-calorie malnutrition: Secondary | ICD-10-CM | POA: Diagnosis not present

## 2023-02-02 DIAGNOSIS — N1831 Chronic kidney disease, stage 3a: Secondary | ICD-10-CM | POA: Diagnosis not present

## 2023-02-04 ENCOUNTER — Other Ambulatory Visit: Payer: Self-pay | Admitting: Cardiology

## 2023-02-04 ENCOUNTER — Telehealth: Payer: Self-pay | Admitting: Pharmacist

## 2023-02-04 ENCOUNTER — Ambulatory Visit (HOSPITAL_COMMUNITY)
Admission: RE | Admit: 2023-02-04 | Discharge: 2023-02-04 | Disposition: A | Discharge status: Home / Self Care | Payer: Medicare PPO | Source: Ambulatory Visit | Attending: Cardiology | Admitting: Cardiology

## 2023-02-04 DIAGNOSIS — R600 Localized edema: Secondary | ICD-10-CM

## 2023-02-04 MED ORDER — APIXABAN 5 MG PO TABS: 5.0000 mg | ORAL_TABLET | Freq: Two times a day (BID) | ORAL | 1 refills | Status: DC

## 2023-02-04 MED ORDER — APIXABAN 5 MG PO TABS: ORAL_TABLET | ORAL | 0 refills | Status: DC

## 2023-02-04 NOTE — Progress Notes (Signed)
Eliquis sent as Rx and stopping ASA . Will f/u with DVT clinic. I will see her in the office in 2-3 months.

## 2023-02-04 NOTE — Telephone Encounter (Signed)
Pamela Decamp, MD 02/04/2023  2:19 PM EST     Eliquis sent as Rx and stopping ASA . Will f/u with DVT clinic. I will see her in the office in 2-3 months.   I spoke with patient.  She is aware of DVT, DVT clinic referral and that blood thinner will be started.  I gave patient Eliquis instructions and let her know prescription has been sent to [facility] on [address].  Patient made aware to stop the ASA/Caffeine medication she takes and to not take any ASA.  Also made patient aware not to take any NSAIDs.   Appointment made for patient to see Dr Jacinto Halim on April 08, 2023 at 11:00

## 2023-02-04 NOTE — Telephone Encounter (Signed)
[facility] on [address] doesn't [facility] Eliquis. Called pt she is ok with going to [facility].  Pt educated on taking 2 tablets BID (12 hr apart) x 7 days then 1 BID. She knows that we had additional rx on file  Free 30 day coupon given to [facility]

## 2023-02-06 DIAGNOSIS — I5042 Chronic combined systolic (congestive) and diastolic (congestive) heart failure: Secondary | ICD-10-CM | POA: Diagnosis not present

## 2023-02-06 DIAGNOSIS — S51001D Unspecified open wound of right elbow, subsequent encounter: Secondary | ICD-10-CM | POA: Diagnosis not present

## 2023-02-06 DIAGNOSIS — S53124D Posterior dislocation of right ulnohumeral joint, subsequent encounter: Secondary | ICD-10-CM | POA: Diagnosis not present

## 2023-02-06 DIAGNOSIS — N1831 Chronic kidney disease, stage 3a: Secondary | ICD-10-CM | POA: Diagnosis not present

## 2023-02-06 DIAGNOSIS — E46 Unspecified protein-calorie malnutrition: Secondary | ICD-10-CM | POA: Diagnosis not present

## 2023-02-06 DIAGNOSIS — D631 Anemia in chronic kidney disease: Secondary | ICD-10-CM | POA: Diagnosis not present

## 2023-02-06 DIAGNOSIS — K529 Noninfective gastroenteritis and colitis, unspecified: Secondary | ICD-10-CM | POA: Diagnosis not present

## 2023-02-06 DIAGNOSIS — I130 Hypertensive heart and chronic kidney disease with heart failure and stage 1 through stage 4 chronic kidney disease, or unspecified chronic kidney disease: Secondary | ICD-10-CM | POA: Diagnosis not present

## 2023-02-06 DIAGNOSIS — M069 Rheumatoid arthritis, unspecified: Secondary | ICD-10-CM | POA: Diagnosis not present

## 2023-02-09 ENCOUNTER — Other Ambulatory Visit: Payer: Self-pay

## 2023-02-09 DIAGNOSIS — T148XXA Other injury of unspecified body region, initial encounter: Secondary | ICD-10-CM | POA: Diagnosis not present

## 2023-02-09 DIAGNOSIS — S51001D Unspecified open wound of right elbow, subsequent encounter: Secondary | ICD-10-CM | POA: Diagnosis not present

## 2023-02-09 DIAGNOSIS — G8929 Other chronic pain: Secondary | ICD-10-CM | POA: Diagnosis not present

## 2023-02-09 DIAGNOSIS — D631 Anemia in chronic kidney disease: Secondary | ICD-10-CM | POA: Diagnosis not present

## 2023-02-09 DIAGNOSIS — Z09 Encounter for follow-up examination after completed treatment for conditions other than malignant neoplasm: Secondary | ICD-10-CM | POA: Diagnosis not present

## 2023-02-09 DIAGNOSIS — E46 Unspecified protein-calorie malnutrition: Secondary | ICD-10-CM | POA: Diagnosis not present

## 2023-02-09 DIAGNOSIS — N1831 Chronic kidney disease, stage 3a: Secondary | ICD-10-CM | POA: Diagnosis not present

## 2023-02-09 DIAGNOSIS — M059 Rheumatoid arthritis with rheumatoid factor, unspecified: Secondary | ICD-10-CM | POA: Diagnosis not present

## 2023-02-09 DIAGNOSIS — R918 Other nonspecific abnormal finding of lung field: Secondary | ICD-10-CM | POA: Diagnosis not present

## 2023-02-09 DIAGNOSIS — I5042 Chronic combined systolic (congestive) and diastolic (congestive) heart failure: Secondary | ICD-10-CM | POA: Diagnosis not present

## 2023-02-09 DIAGNOSIS — I130 Hypertensive heart and chronic kidney disease with heart failure and stage 1 through stage 4 chronic kidney disease, or unspecified chronic kidney disease: Secondary | ICD-10-CM | POA: Diagnosis not present

## 2023-02-09 DIAGNOSIS — R911 Solitary pulmonary nodule: Secondary | ICD-10-CM

## 2023-02-09 DIAGNOSIS — K529 Noninfective gastroenteritis and colitis, unspecified: Secondary | ICD-10-CM | POA: Diagnosis not present

## 2023-02-09 DIAGNOSIS — M069 Rheumatoid arthritis, unspecified: Secondary | ICD-10-CM | POA: Diagnosis not present

## 2023-02-09 DIAGNOSIS — S53124D Posterior dislocation of right ulnohumeral joint, subsequent encounter: Secondary | ICD-10-CM | POA: Diagnosis not present

## 2023-02-09 DIAGNOSIS — I742 Embolism and thrombosis of arteries of the upper extremities: Secondary | ICD-10-CM | POA: Diagnosis not present

## 2023-02-11 ENCOUNTER — Other Ambulatory Visit: Payer: Self-pay | Admitting: *Deleted

## 2023-02-11 ENCOUNTER — Other Ambulatory Visit (HOSPITAL_COMMUNITY): Payer: Self-pay

## 2023-02-11 DIAGNOSIS — N1831 Chronic kidney disease, stage 3a: Secondary | ICD-10-CM | POA: Diagnosis not present

## 2023-02-11 DIAGNOSIS — S53124D Posterior dislocation of right ulnohumeral joint, subsequent encounter: Secondary | ICD-10-CM | POA: Diagnosis not present

## 2023-02-11 DIAGNOSIS — S51001D Unspecified open wound of right elbow, subsequent encounter: Secondary | ICD-10-CM | POA: Diagnosis not present

## 2023-02-11 DIAGNOSIS — I82622 Acute embolism and thrombosis of deep veins of left upper extremity: Secondary | ICD-10-CM

## 2023-02-11 DIAGNOSIS — I5042 Chronic combined systolic (congestive) and diastolic (congestive) heart failure: Secondary | ICD-10-CM | POA: Diagnosis not present

## 2023-02-11 DIAGNOSIS — E46 Unspecified protein-calorie malnutrition: Secondary | ICD-10-CM | POA: Diagnosis not present

## 2023-02-11 DIAGNOSIS — K529 Noninfective gastroenteritis and colitis, unspecified: Secondary | ICD-10-CM | POA: Diagnosis not present

## 2023-02-11 DIAGNOSIS — D631 Anemia in chronic kidney disease: Secondary | ICD-10-CM | POA: Diagnosis not present

## 2023-02-11 DIAGNOSIS — I130 Hypertensive heart and chronic kidney disease with heart failure and stage 1 through stage 4 chronic kidney disease, or unspecified chronic kidney disease: Secondary | ICD-10-CM | POA: Diagnosis not present

## 2023-02-11 DIAGNOSIS — M069 Rheumatoid arthritis, unspecified: Secondary | ICD-10-CM | POA: Diagnosis not present

## 2023-02-12 DIAGNOSIS — M069 Rheumatoid arthritis, unspecified: Secondary | ICD-10-CM | POA: Diagnosis not present

## 2023-02-12 DIAGNOSIS — I5042 Chronic combined systolic (congestive) and diastolic (congestive) heart failure: Secondary | ICD-10-CM | POA: Diagnosis not present

## 2023-02-12 DIAGNOSIS — I130 Hypertensive heart and chronic kidney disease with heart failure and stage 1 through stage 4 chronic kidney disease, or unspecified chronic kidney disease: Secondary | ICD-10-CM | POA: Diagnosis not present

## 2023-02-12 DIAGNOSIS — E46 Unspecified protein-calorie malnutrition: Secondary | ICD-10-CM | POA: Diagnosis not present

## 2023-02-12 DIAGNOSIS — S53124D Posterior dislocation of right ulnohumeral joint, subsequent encounter: Secondary | ICD-10-CM | POA: Diagnosis not present

## 2023-02-12 DIAGNOSIS — D631 Anemia in chronic kidney disease: Secondary | ICD-10-CM | POA: Diagnosis not present

## 2023-02-12 DIAGNOSIS — N1831 Chronic kidney disease, stage 3a: Secondary | ICD-10-CM | POA: Diagnosis not present

## 2023-02-12 DIAGNOSIS — S51001D Unspecified open wound of right elbow, subsequent encounter: Secondary | ICD-10-CM | POA: Diagnosis not present

## 2023-02-12 DIAGNOSIS — K529 Noninfective gastroenteritis and colitis, unspecified: Secondary | ICD-10-CM | POA: Diagnosis not present

## 2023-02-13 ENCOUNTER — Ambulatory Visit (HOSPITAL_COMMUNITY)
Admission: RE | Admit: 2023-02-13 | Discharge: 2023-02-13 | Disposition: A | Discharge status: Home / Self Care | Payer: Medicare PPO | Source: Ambulatory Visit | Attending: Vascular Surgery | Admitting: Vascular Surgery

## 2023-02-13 DIAGNOSIS — I82A12 Acute embolism and thrombosis of left axillary vein: Secondary | ICD-10-CM | POA: Diagnosis not present

## 2023-02-13 NOTE — Progress Notes (Signed)
DVT Clinic Note  Name: Pamela Dunn     MRN: [id number]     DOB: [birth year]     Sex: female  PCP: Irena Reichmann, DO  Today's Visit: Visit Information: Initial Visit  Referred to DVT Clinic by:  Cardiology  - Dr. Jacinto Halim [facility]) Referred to CPP by: Dr. Hetty Blend Reason for referral:  Chief Complaint  Patient presents with   Med Management - DVT   HISTORY OF PRESENT ILLNESS: Pamela Dunn is a 89 y.o. female with PMH CHF, complete heart block s/p pacemaker, HTN, HLD, CKD, rheumatoid arthritis, hypothyroidism, who presents after diagnosis of DVT for follow up medication management. Patient was recently admitted to [facility] 10/30-11/4/24 for dehydration related to vomiting and not eating for the prior few days which resolved with IV fluids. X-ray of the right elbow showed posterior displacement of the ulna. She reported her last fall as being in August 2024. X-ray also showed significant destruction of both elbows consistent with underlying RA. She was discharged to a SNF. She saw ortho 01/19/23 who noticed edema in her left hand and had her elevate and wear a compression glove. She was then seen by Dr. Jacinto Halim on 01/26/23 with worsening left arm swelling. Ultrasound showed chronic DVT in the left subclavian vein and acute occlusive DVT in the left axillary and brachial veins. Dr. Jacinto Halim stopped her aspirin, started Eliquis, and referred her to the DVT Clinic for follow up.   Today, patient arrives in a wheelchair accompanied by her son, Rocky Link. She typically ambulates with a walker and is now back to living at home. She began to have a severe RA flare in the days leading up to her recent hospitalization, affecting her left arm worse than right. She was unable to move her left arm. She has had RA since she was 89 years old but this was the most severe flare she has experienced. Following her discharge, she developed swelling that started in her fingers and traveled up her arm to her shoulder. She  had an ultrasound performed at the SNF (results unavailable in Epic) about 1 week after discharge and they found no DVT at that time. Per chart review of her recent hospitalization, she had peripheral IV's in her left forearm, antecubital, wrist, and hand while she was admitted. Patient tells me she had an IV blown while she was admitted. She asks if she is allowed to continue physical therapy exercises for her left arm now that she has a DVT. She has been taking Eliquis as prescribed and is having no abnormal bruising or bleeding. She takes it at 8am and 8pm with no missed doses so far. She has been elevating her arm and she has already seen improvements in her pain and swelling.   Positive Thrombotic Risk Factors: Recent admission to hospital with acute illness (within 3 months), Older Age, Non-malignant, chronic inflammatory condition Bleeding Risk Factors: Age >65 years, Anticoagulant therapy  Negative Thrombotic Risk Factors: Previous VTE, Recent surgery (within 3 months), Recent trauma (within 3 months), Paralysis, paresis, or recent plaster cast immobilization of lower extremity, Central venous catheterization, Bed rest >72 hours within 3 months, Sedentary journey lasting >8 hours within 4 weeks, Pregnancy, Within 6 weeks postpartum, Recent cesarean section (within 3 months), Estrogen therapy, Testosterone therapy, Erythropoiesis-stimulating agent, Recent COVID diagnosis (within 3 months), Active cancer, Known thrombophilic condition, Smoking, Obesity  Rx Insurance Coverage: Medicare Rx Affordability: Eliquis is $40 per 30 day supply or $80 per 90 day supply, which  is affordable for her.  Preferred Pharmacy: [facility].   Past Medical History:  Diagnosis Date   Allergy    seasonal   Anemia    Arthritis    Encounter for care of pacemaker 07/12/2020   GERD (gastroesophageal reflux disease)    Heart block AV complete (HCC) 07/11/2020    Hyperlipidemia    Hypertension    Pacemaker Abbott Assurity MRI model [id number] 07/12/2020 07/12/2020    Past Surgical History:  Procedure Laterality Date   APPENDECTOMY     BACK SURGERY     BOWEL RESECTION N/A 08/24/2019   Procedure: SMALL BOWEL RESECTION;  Surgeon: Abigail Miyamoto, MD;  Location: [facility];  Service: General;  Laterality: N/A;   LAPAROSCOPY N/A 08/24/2019   Procedure: LAPAROSCOPY DIAGNOSTIC;  Surgeon: Abigail Miyamoto, MD;  Location: [facility];  Service: General;  Laterality: N/A;   LAPAROTOMY N/A 08/24/2019   Procedure: EXPLORATORY LAPAROTOMY;  Surgeon: Abigail Miyamoto, MD;  Location: [facility];  Service: General;  Laterality: N/A;   PACEMAKER IMPLANT N/A 07/12/2020   Procedure: PACEMAKER IMPLANT;  Surgeon: Hillis Range, MD;  Location: [facility];  Service: Cardiovascular;  Laterality: N/A;    Social History   Socioeconomic History   Marital status: Widowed    Spouse name: Not on file   Number of children: 2   Years of education: Not on file   Highest education level: Not on file  Occupational History   Not on file  Tobacco Use   Smoking status: Never   Smokeless tobacco: Never  Vaping Use   Vaping status: Never Used  Substance and Sexual Activity   Alcohol use: No   Drug use: No   Sexual activity: Not on file  Other Topics Concern   Not on file  Social History Narrative   Not on file   Social Determinants of Health   Financial Resource Strain: Not on file  Food Insecurity: No Food Insecurity (01/07/2023)   Hunger Vital Sign    Worried About Running Out of Food in the Last Year: Never true    Ran Out of Food in the Last Year: Never true  Transportation Needs: No Transportation Needs (01/07/2023)   PRAPARE - Administrator, Civil Service (Medical): No    Lack of Transportation (Non-Medical): No  Physical Activity: Not on file  Stress: Not on file  Social Connections: Not on file  Intimate Partner Violence: Not At Risk (01/07/2023)    Humiliation, Afraid, Rape, and Kick questionnaire    Fear of Current or Ex-Partner: No    Emotionally Abused: No    Physically Abused: No    Sexually Abused: No    Family History  Problem Relation Age of Onset   Cancer Mother    Cancer - Colon Sister    Heart disease Sister    Brain cancer Brother    Cancer - Colon Brother    Cancer - Colon Brother    Breast cancer Maternal Aunt     Allergies as of 02/13/2023 - Review Complete 02/13/2023  Allergen Reaction Noted   Coreg [carvedilol] Diarrhea 09/23/2018   Lorazepam Diarrhea 10/18/2020    Current Outpatient Medications on File Prior to Encounter  Medication Sig Dispense Refill   apixaban (ELIQUIS) 5 MG TABS tablet Take 2 tablets (10mg ) twice daily for 7 days, then 1 tablet (5mg ) twice daily 74 tablet 0   calcium carbonate (TUMS - DOSED IN MG ELEMENTAL  CALCIUM) 500 MG chewable tablet Chew 1 tablet by mouth daily.     cholecalciferol (VITAMIN D) 1000 UNITS tablet Take 1,000 Units by mouth daily.      Coenzyme Q10 (COQ10) 200 MG CAPS Take 200 mg by mouth daily.     diclofenac Sodium (VOLTAREN) 1 % GEL Apply 2 g topically 4 (four) times daily. 300 g 0   ezetimibe (ZETIA) 10 MG tablet Take 10 mg by mouth daily.      folic acid (FOLVITE) 1 MG tablet Take 1 mg by mouth 2 (two) times daily.     isosorbide-hydrALAZINE (BIDIL) 20-37.5 MG tablet TAKE 1 TABLET THREE TIMES DAILY (Patient taking differently: Take 1 tablet by mouth in the morning and at bedtime.) 270 tablet 3   levothyroxine (SYNTHROID) 25 MCG tablet Take 25 mcg by mouth daily.      metoprolol succinate (TOPROL-XL) 25 MG 24 hr tablet Take 1 tablet (25 mg total) by mouth daily. 90 tablet 0   Omega-3 Fatty Acids (OMEGA-3 FISH OIL PO) Take 1 capsule by mouth in the morning and at bedtime.     ondansetron (ZOFRAN-ODT) 4 MG disintegrating tablet Take 4 mg by mouth every 8 (eight) hours as needed.     polyethylene glycol (MIRALAX / GLYCOLAX) 17 g packet Take 17 g by mouth daily.      predniSONE (DELTASONE) 5 MG tablet Take 5 mg by mouth daily.     apixaban (ELIQUIS) 5 MG TABS tablet Take 1 tablet (5 mg total) by mouth 2 (two) times daily. Place on hold- fill after starter dose 60 tablet 1   No current facility-administered medications on file prior to encounter.   REVIEW OF SYSTEMS:  Review of Systems  Respiratory:  Negative for shortness of breath.   Cardiovascular:  Negative for chest pain and palpitations.  Musculoskeletal:  Positive for joint pain (related to RA).  Neurological:  Negative for dizziness and tingling.   PHYSICAL EXAMINATION:  Unable to check vitals today due to RA pain in both arms and DVT in left arm.   Physical Exam Musculoskeletal:        General: Swelling (LUE from fingers to shoulder) and tenderness present.  Skin:    Findings: No bruising or erythema.  Psychiatric:        Mood and Affect: Mood normal.        Behavior: Behavior normal.        Thought Content: Thought content normal.   Villalta Score for Post-Thrombotic Syndrome: Pain: Mild Cramps: Absent Heaviness: Severe Paresthesia: Mild Pruritus: Absent Pretibial Edema: Moderate Skin Induration: Absent Hyperpigmentation: Absent Redness: Mild Venous Ectasia: Mild Pain on calf compression: Absent Villalta Preliminary Score: 9 Is venous ulcer present?: No If venous ulcer is present and score is <15, then 15 points total are assigned: Absent Villalta Total Score: 9  LABS:  CBC     Component Value Date/Time   WBC 15.6 (H) 01/07/2023 0738   RBC 3.57 (L) 01/07/2023 0738   HGB 11.2 (L) 01/07/2023 0738   HCT 33.0 (L) 01/07/2023 0738   PLT 241 01/07/2023 0738   MCV 92.4 01/07/2023 0738   MCH 31.4 01/07/2023 0738   MCHC 33.9 01/07/2023 0738   RDW 14.7 01/07/2023 0738   LYMPHSABS 0.7 01/06/2023 1543   MONOABS 0.8 01/06/2023 1543   EOSABS 0.4 01/06/2023 1543   BASOSABS 0.0 01/06/2023 1543    Hepatic Function      Component Value Date/Time   PROT 5.0 (L) 01/06/2023 1543  ALBUMIN 2.7 (L) 01/06/2023 1543   AST 27 01/06/2023 1543   ALT 9 01/06/2023 1543   ALKPHOS 42 01/06/2023 1543   BILITOT 1.0 01/06/2023 1543    Renal Function   Lab Results  Component Value Date   CREATININE 1.12 (H) 01/07/2023   CREATININE 1.31 (H) 01/06/2023   CREATININE 1.08 (H) 09/04/2022    CrCl cannot be calculated (Patient's most recent lab result is older than the maximum 21 days allowed.).   [facility] Vascular Lab Studies:  02/04/23 VAS [place] UPPER EXTREMITY VENOUS DUPLEX LEFT Summary:  Right:  -No evidence of thrombosis in the subclavian.    Left:  -Findings consistent with acute deep vein thrombosis involving the left axillary vein and left brachial veins. Findings consistent with chronic deep vein thrombosis involving the left subclavian vein.   Technician findings:  Significant difference in waveforms comparing left to right subclavian  veins. More chronic appearing thrombus in the subclavian vein, with  brighter echo's and some recanalization. The axillary and brachial veins are dilated with softly echogenic material, more suggestive of acute DVT.   ASSESSMENT: Location of DVT: Left upper extremity Cause of DVT: provoked by a transient risk factor - recent hospital admission with 4 IV's placed in the left arm, which occurred at the same time as a severe rheumatoid arthritis flare. Since her DVT is in the upper extremity, discussed patient with vascular surgeon Dr. Hetty Blend, who recommends treating with anticoagulation for a total of 3 months. No need for vascular surgery intervention in light of her age and the clot in the subclavian vein being chronic. Regarding her question about physical therapy, we are okay with her continuing physical therapy for her left arm. Will have her continue elevation to help improve swelling. She is tolerating Eliquis well thus far and has refills available at her pharmacy to complete 3 months of treatment. No concerns for medication access or  affordability at this time. All of her questions have been answered.   PLAN: -Continue apixaban (Eliquis) 5 mg twice daily. -Expected duration of therapy: 3 months. Therapy started on 02/04/23. -Patient educated on purpose, proper use and potential adverse effects of apixaban (Eliquis). -Discussed importance of taking medication around the same time every day. -Advised patient of medications to avoid (NSAIDs, aspirin doses >100 mg daily). -Educated that Tylenol (acetaminophen) is the preferred analgesic to lower the risk of bleeding. -Advised patient to alert all providers of anticoagulation therapy prior to starting a new medication or having a procedure. -Emphasized importance of monitoring for signs and symptoms of bleeding (abnormal bruising, prolonged bleeding, nose bleeds, bleeding from gums, discolored urine, black tarry stools). -Educated patient to present to the ED if emergent signs and symptoms of new thrombosis occur. -Continue elevation to help improve swelling.   Follow up: With Dr. Jacinto Halim in January. DVT Clinic available as needed.   Pervis Hocking, PharmD, [place]y [place], CPP Deep Vein Thrombosis Clinic Clinical Pharmacist Practitioner Office: [phone]

## 2023-02-13 NOTE — Patient Instructions (Addendum)
-  Continue Eliquis 5 mg (1 tablet) twice daily for a total of 3 months. This will end around the end of February. [facility] to [facility].  -Dr. Hetty Blend (vascular surgeon) is okay with you continuing physical therapy for your left arm. He has no restrictions for you at this time.  -Continue elevating your arm daily to help improve the swelling.  -It is important to take your medication around the same time every day.  -Avoid NSAIDs like ibuprofen (Advil, Motrin) and naproxen (Aleve) as well as aspirin doses over 100 mg daily. -Tylenol (acetaminophen) is the preferred over the counter pain medication to lower the risk of bleeding. -Be sure to alert all of your health care providers that you are taking an anticoagulant prior to starting a new medication or having a procedure. -Monitor for signs and symptoms of bleeding (abnormal bruising, prolonged bleeding, nose bleeds, bleeding from gums, discolored urine, black tarry stools). If you have fallen and hit your head OR if your bleeding is severe or not stopping, seek emergency care.  -Go to the emergency room if emergent signs and symptoms of new clot occur (new or worse swelling and pain in an arm or leg, shortness of breath, chest pain, fast or irregular heartbeats, lightheadedness, dizziness, fainting, coughing up blood) or if you experience a significant color change (pale or blue) in the extremity that has the DVT.   If you have any questions or need to reschedule an appointment, please call [phone] [facility].  If you are having an emergency, call 911 or present to the nearest emergency room.   What is a DVT?  -Deep vein thrombosis (DVT) is a condition in which a blood clot forms in a vein of the deep venous system which can occur in the lower leg, thigh, pelvis, arm, or neck. This condition is serious and can be life-threatening if the clot travels to the arteries of the lungs and causing a blockage (pulmonary  embolism, PE). A DVT can also damage veins in the leg, which can lead to long-term venous disease, leg pain, swelling, discoloration, and ulcers or sores (post-thrombotic syndrome).  -Treatment may include taking an anticoagulant medication to prevent more clots from forming and the current clot from growing, wearing compression stockings, and/or surgical procedures to remove or dissolve the clot.

## 2023-02-16 DIAGNOSIS — I130 Hypertensive heart and chronic kidney disease with heart failure and stage 1 through stage 4 chronic kidney disease, or unspecified chronic kidney disease: Secondary | ICD-10-CM | POA: Diagnosis not present

## 2023-02-16 DIAGNOSIS — M069 Rheumatoid arthritis, unspecified: Secondary | ICD-10-CM | POA: Diagnosis not present

## 2023-02-16 DIAGNOSIS — N1831 Chronic kidney disease, stage 3a: Secondary | ICD-10-CM | POA: Diagnosis not present

## 2023-02-16 DIAGNOSIS — S53124D Posterior dislocation of right ulnohumeral joint, subsequent encounter: Secondary | ICD-10-CM | POA: Diagnosis not present

## 2023-02-16 DIAGNOSIS — K529 Noninfective gastroenteritis and colitis, unspecified: Secondary | ICD-10-CM | POA: Diagnosis not present

## 2023-02-16 DIAGNOSIS — S51001D Unspecified open wound of right elbow, subsequent encounter: Secondary | ICD-10-CM | POA: Diagnosis not present

## 2023-02-16 DIAGNOSIS — I5042 Chronic combined systolic (congestive) and diastolic (congestive) heart failure: Secondary | ICD-10-CM | POA: Diagnosis not present

## 2023-02-16 DIAGNOSIS — D631 Anemia in chronic kidney disease: Secondary | ICD-10-CM | POA: Diagnosis not present

## 2023-02-16 DIAGNOSIS — E46 Unspecified protein-calorie malnutrition: Secondary | ICD-10-CM | POA: Diagnosis not present

## 2023-02-18 DIAGNOSIS — E46 Unspecified protein-calorie malnutrition: Secondary | ICD-10-CM | POA: Diagnosis not present

## 2023-02-18 DIAGNOSIS — I5042 Chronic combined systolic (congestive) and diastolic (congestive) heart failure: Secondary | ICD-10-CM | POA: Diagnosis not present

## 2023-02-18 DIAGNOSIS — N1831 Chronic kidney disease, stage 3a: Secondary | ICD-10-CM | POA: Diagnosis not present

## 2023-02-18 DIAGNOSIS — I130 Hypertensive heart and chronic kidney disease with heart failure and stage 1 through stage 4 chronic kidney disease, or unspecified chronic kidney disease: Secondary | ICD-10-CM | POA: Diagnosis not present

## 2023-02-18 DIAGNOSIS — S51001D Unspecified open wound of right elbow, subsequent encounter: Secondary | ICD-10-CM | POA: Diagnosis not present

## 2023-02-18 DIAGNOSIS — D631 Anemia in chronic kidney disease: Secondary | ICD-10-CM | POA: Diagnosis not present

## 2023-02-18 DIAGNOSIS — K529 Noninfective gastroenteritis and colitis, unspecified: Secondary | ICD-10-CM | POA: Diagnosis not present

## 2023-02-18 DIAGNOSIS — S53124D Posterior dislocation of right ulnohumeral joint, subsequent encounter: Secondary | ICD-10-CM | POA: Diagnosis not present

## 2023-02-18 DIAGNOSIS — M069 Rheumatoid arthritis, unspecified: Secondary | ICD-10-CM | POA: Diagnosis not present

## 2023-02-19 DIAGNOSIS — E46 Unspecified protein-calorie malnutrition: Secondary | ICD-10-CM | POA: Diagnosis not present

## 2023-02-19 DIAGNOSIS — I130 Hypertensive heart and chronic kidney disease with heart failure and stage 1 through stage 4 chronic kidney disease, or unspecified chronic kidney disease: Secondary | ICD-10-CM | POA: Diagnosis not present

## 2023-02-19 DIAGNOSIS — I5042 Chronic combined systolic (congestive) and diastolic (congestive) heart failure: Secondary | ICD-10-CM | POA: Diagnosis not present

## 2023-02-19 DIAGNOSIS — M069 Rheumatoid arthritis, unspecified: Secondary | ICD-10-CM | POA: Diagnosis not present

## 2023-02-19 DIAGNOSIS — N1831 Chronic kidney disease, stage 3a: Secondary | ICD-10-CM | POA: Diagnosis not present

## 2023-02-19 DIAGNOSIS — D631 Anemia in chronic kidney disease: Secondary | ICD-10-CM | POA: Diagnosis not present

## 2023-02-19 DIAGNOSIS — S51001D Unspecified open wound of right elbow, subsequent encounter: Secondary | ICD-10-CM | POA: Diagnosis not present

## 2023-02-19 DIAGNOSIS — S53124D Posterior dislocation of right ulnohumeral joint, subsequent encounter: Secondary | ICD-10-CM | POA: Diagnosis not present

## 2023-02-19 DIAGNOSIS — K529 Noninfective gastroenteritis and colitis, unspecified: Secondary | ICD-10-CM | POA: Diagnosis not present

## 2023-02-20 DIAGNOSIS — E46 Unspecified protein-calorie malnutrition: Secondary | ICD-10-CM | POA: Diagnosis not present

## 2023-02-20 DIAGNOSIS — I130 Hypertensive heart and chronic kidney disease with heart failure and stage 1 through stage 4 chronic kidney disease, or unspecified chronic kidney disease: Secondary | ICD-10-CM | POA: Diagnosis not present

## 2023-02-20 DIAGNOSIS — K529 Noninfective gastroenteritis and colitis, unspecified: Secondary | ICD-10-CM | POA: Diagnosis not present

## 2023-02-20 DIAGNOSIS — M069 Rheumatoid arthritis, unspecified: Secondary | ICD-10-CM | POA: Diagnosis not present

## 2023-02-23 DIAGNOSIS — M810 Age-related osteoporosis without current pathological fracture: Secondary | ICD-10-CM | POA: Diagnosis not present

## 2023-02-23 DIAGNOSIS — M25572 Pain in left ankle and joints of left foot: Secondary | ICD-10-CM | POA: Diagnosis not present

## 2023-02-23 DIAGNOSIS — M1990 Unspecified osteoarthritis, unspecified site: Secondary | ICD-10-CM | POA: Diagnosis not present

## 2023-02-23 DIAGNOSIS — R202 Paresthesia of skin: Secondary | ICD-10-CM | POA: Diagnosis not present

## 2023-02-23 DIAGNOSIS — E46 Unspecified protein-calorie malnutrition: Secondary | ICD-10-CM | POA: Diagnosis not present

## 2023-02-23 DIAGNOSIS — I130 Hypertensive heart and chronic kidney disease with heart failure and stage 1 through stage 4 chronic kidney disease, or unspecified chronic kidney disease: Secondary | ICD-10-CM | POA: Diagnosis not present

## 2023-02-23 DIAGNOSIS — K529 Noninfective gastroenteritis and colitis, unspecified: Secondary | ICD-10-CM | POA: Diagnosis not present

## 2023-02-23 DIAGNOSIS — M79643 Pain in unspecified hand: Secondary | ICD-10-CM | POA: Diagnosis not present

## 2023-02-23 DIAGNOSIS — M0579 Rheumatoid arthritis with rheumatoid factor of multiple sites without organ or systems involvement: Secondary | ICD-10-CM | POA: Diagnosis not present

## 2023-02-23 DIAGNOSIS — I5042 Chronic combined systolic (congestive) and diastolic (congestive) heart failure: Secondary | ICD-10-CM | POA: Diagnosis not present

## 2023-02-23 DIAGNOSIS — M069 Rheumatoid arthritis, unspecified: Secondary | ICD-10-CM | POA: Diagnosis not present

## 2023-02-23 DIAGNOSIS — S53124D Posterior dislocation of right ulnohumeral joint, subsequent encounter: Secondary | ICD-10-CM | POA: Diagnosis not present

## 2023-02-23 DIAGNOSIS — M109 Gout, unspecified: Secondary | ICD-10-CM | POA: Diagnosis not present

## 2023-02-23 DIAGNOSIS — S51001D Unspecified open wound of right elbow, subsequent encounter: Secondary | ICD-10-CM | POA: Diagnosis not present

## 2023-02-23 DIAGNOSIS — Z79899 Other long term (current) drug therapy: Secondary | ICD-10-CM | POA: Diagnosis not present

## 2023-02-23 DIAGNOSIS — D631 Anemia in chronic kidney disease: Secondary | ICD-10-CM | POA: Diagnosis not present

## 2023-02-23 DIAGNOSIS — N1831 Chronic kidney disease, stage 3a: Secondary | ICD-10-CM | POA: Diagnosis not present

## 2023-02-24 DIAGNOSIS — I130 Hypertensive heart and chronic kidney disease with heart failure and stage 1 through stage 4 chronic kidney disease, or unspecified chronic kidney disease: Secondary | ICD-10-CM | POA: Diagnosis not present

## 2023-02-24 DIAGNOSIS — S51001D Unspecified open wound of right elbow, subsequent encounter: Secondary | ICD-10-CM | POA: Diagnosis not present

## 2023-02-24 DIAGNOSIS — M069 Rheumatoid arthritis, unspecified: Secondary | ICD-10-CM | POA: Diagnosis not present

## 2023-02-24 DIAGNOSIS — K529 Noninfective gastroenteritis and colitis, unspecified: Secondary | ICD-10-CM | POA: Diagnosis not present

## 2023-02-24 DIAGNOSIS — E46 Unspecified protein-calorie malnutrition: Secondary | ICD-10-CM | POA: Diagnosis not present

## 2023-02-24 DIAGNOSIS — I5042 Chronic combined systolic (congestive) and diastolic (congestive) heart failure: Secondary | ICD-10-CM | POA: Diagnosis not present

## 2023-02-24 DIAGNOSIS — S53124D Posterior dislocation of right ulnohumeral joint, subsequent encounter: Secondary | ICD-10-CM | POA: Diagnosis not present

## 2023-02-24 DIAGNOSIS — D631 Anemia in chronic kidney disease: Secondary | ICD-10-CM | POA: Diagnosis not present

## 2023-02-24 DIAGNOSIS — N1831 Chronic kidney disease, stage 3a: Secondary | ICD-10-CM | POA: Diagnosis not present

## 2023-02-28 DIAGNOSIS — K529 Noninfective gastroenteritis and colitis, unspecified: Secondary | ICD-10-CM | POA: Diagnosis not present

## 2023-02-28 DIAGNOSIS — E46 Unspecified protein-calorie malnutrition: Secondary | ICD-10-CM | POA: Diagnosis not present

## 2023-02-28 DIAGNOSIS — M069 Rheumatoid arthritis, unspecified: Secondary | ICD-10-CM | POA: Diagnosis not present

## 2023-02-28 DIAGNOSIS — I130 Hypertensive heart and chronic kidney disease with heart failure and stage 1 through stage 4 chronic kidney disease, or unspecified chronic kidney disease: Secondary | ICD-10-CM | POA: Diagnosis not present

## 2023-02-28 DIAGNOSIS — N1831 Chronic kidney disease, stage 3a: Secondary | ICD-10-CM | POA: Diagnosis not present

## 2023-02-28 DIAGNOSIS — I5042 Chronic combined systolic (congestive) and diastolic (congestive) heart failure: Secondary | ICD-10-CM | POA: Diagnosis not present

## 2023-02-28 DIAGNOSIS — S53124D Posterior dislocation of right ulnohumeral joint, subsequent encounter: Secondary | ICD-10-CM | POA: Diagnosis not present

## 2023-02-28 DIAGNOSIS — S51001D Unspecified open wound of right elbow, subsequent encounter: Secondary | ICD-10-CM | POA: Diagnosis not present

## 2023-02-28 DIAGNOSIS — D631 Anemia in chronic kidney disease: Secondary | ICD-10-CM | POA: Diagnosis not present

## 2023-03-01 DIAGNOSIS — K529 Noninfective gastroenteritis and colitis, unspecified: Secondary | ICD-10-CM | POA: Diagnosis not present

## 2023-03-01 DIAGNOSIS — N1831 Chronic kidney disease, stage 3a: Secondary | ICD-10-CM | POA: Diagnosis not present

## 2023-03-01 DIAGNOSIS — M069 Rheumatoid arthritis, unspecified: Secondary | ICD-10-CM | POA: Diagnosis not present

## 2023-03-01 DIAGNOSIS — E46 Unspecified protein-calorie malnutrition: Secondary | ICD-10-CM | POA: Diagnosis not present

## 2023-03-01 DIAGNOSIS — I5042 Chronic combined systolic (congestive) and diastolic (congestive) heart failure: Secondary | ICD-10-CM | POA: Diagnosis not present

## 2023-03-01 DIAGNOSIS — I130 Hypertensive heart and chronic kidney disease with heart failure and stage 1 through stage 4 chronic kidney disease, or unspecified chronic kidney disease: Secondary | ICD-10-CM | POA: Diagnosis not present

## 2023-03-01 DIAGNOSIS — S51001D Unspecified open wound of right elbow, subsequent encounter: Secondary | ICD-10-CM | POA: Diagnosis not present

## 2023-03-01 DIAGNOSIS — S53124D Posterior dislocation of right ulnohumeral joint, subsequent encounter: Secondary | ICD-10-CM | POA: Diagnosis not present

## 2023-03-01 DIAGNOSIS — D631 Anemia in chronic kidney disease: Secondary | ICD-10-CM | POA: Diagnosis not present

## 2023-03-02 ENCOUNTER — Encounter: Payer: Self-pay | Admitting: Cardiology

## 2023-03-02 ENCOUNTER — Ambulatory Visit: Payer: Medicare PPO | Attending: Cardiology | Admitting: Cardiology

## 2023-03-02 VITALS — HR 72 | Ht 60.0 in | Wt 131.0 lb

## 2023-03-02 DIAGNOSIS — I442 Atrioventricular block, complete: Secondary | ICD-10-CM

## 2023-03-02 DIAGNOSIS — Z950 Presence of cardiac pacemaker: Secondary | ICD-10-CM | POA: Diagnosis not present

## 2023-03-02 DIAGNOSIS — I428 Other cardiomyopathies: Secondary | ICD-10-CM

## 2023-03-02 LAB — CUP PACEART INCLINIC DEVICE CHECK
Battery Remaining Longevity: 82 mo
Battery Voltage: 3.01 V
Brady Statistic RA Percent Paced: 73 %
Brady Statistic RV Percent Paced: 99.68 %
Date Time Interrogation Session: 20241223143543
Implantable Lead Connection Status: 753985
Implantable Lead Connection Status: 753985
Implantable Lead Implant Date: 20220505
Implantable Lead Implant Date: 20220505
Implantable Lead Location: 753859
Implantable Lead Location: 753860
Implantable Pulse Generator Implant Date: 20220505
Lead Channel Impedance Value: 387.5 Ohm
Lead Channel Impedance Value: 550 Ohm
Lead Channel Pacing Threshold Amplitude: 0.75 V
Lead Channel Pacing Threshold Amplitude: 0.75 V
Lead Channel Pacing Threshold Amplitude: 0.75 V
Lead Channel Pacing Threshold Amplitude: 0.75 V
Lead Channel Pacing Threshold Pulse Width: 0.5 ms
Lead Channel Pacing Threshold Pulse Width: 0.5 ms
Lead Channel Pacing Threshold Pulse Width: 0.5 ms
Lead Channel Pacing Threshold Pulse Width: 0.5 ms
Lead Channel Sensing Intrinsic Amplitude: 1.2 mV
Lead Channel Setting Pacing Amplitude: 1.625
Lead Channel Setting Pacing Amplitude: 2 V
Lead Channel Setting Pacing Pulse Width: 0.5 ms
Lead Channel Setting Sensing Sensitivity: 2 mV
Pulse Gen Model: 2272
Pulse Gen Serial Number: 3920272

## 2023-03-02 NOTE — Patient Instructions (Signed)
 Medication Instructions:  Your physician recommends that you continue on your current medications as directed. Please refer to the Current Medication list given to you today.  *If you need a refill on your cardiac medications before your next appointment, please call your pharmacy*  Follow-Up: At [facility], you and your health needs are our priority.  As part of our continuing mission to provide you with exceptional heart care, we have created designated Provider Care Teams.  These Care Teams include your primary Cardiologist (physician) and Advanced Practice Providers (APPs -  Physician Assistants and Nurse Practitioners) who all work together to provide you with the care you need, when you need it.  Your next appointment:   1 year  Provider:   You may see Nobie Putnam, MD or one of the following Advanced Practice Providers on your designated Care Team:   Francis Dowse, [place] [address] [place], [place] Sherie Don, NP Canary Brim, NP

## 2023-03-02 NOTE — Progress Notes (Signed)
  Electrophysiology Office Note:   Date:  03/02/2023  ID:  Pamela Dunn, DOB 05-Oct-1931, MRN [id number]  Primary Cardiologist: None Primary Heart Failure: None Electrophysiologist: Nobie Putnam, MD      History of Present Illness:   Pamela Dunn is a 89 y.o. female with h/o hypertension, hyperlipidemia, chronic stage IIIa kidney disease, severe DJD and RA with arthritis who is being seen today for to establish care for her pacemaker.   Patient had an episode of syncope on 07/11/2020 and was found to have complete heart block. She underwent Abbott dual-chamber pacemaker implantation on 07/12/2020. She denies any syncope since. Her biggest concern at this time is related to joint pains secondary to her rheumatoid arthritis, which recently required hospitalization for flare in October.  She is on a prednisone taper currently.  Overall her symptoms have improved with this.  She has no new or acute complaints otherwise.  She denies palpitations, lower extremity edema, shortness of breath, chest pain.   Review of systems complete and found to be negative unless listed in HPI.   EP Information / Studies Reviewed:    EKG is not ordered today. EKG from 01/09/23 reviewed which showed sinus rhythm with V pacing. RV apical pacing morphology, QRS .      [facility] Echocardiogram 06/10/2021:  Left ventricle cavity is normal in size. Mild concentric hypertrophy of  the left ventricle. Abnormal septal wall motion due to left bundle branch  block. Normal LV systolic function with EF 57%. Doppler evidence of grade  I (impaired) diastolic dysfunction, normal LAP.  Moderate tricuspid regurgitation. Estimated pulmonary artery systolic  pressure 27 mmHg.  Unlike previous study in 2063m mitral regurgitation not appreciated. No  other significant change noted.    Physical Exam:   VS:  Pulse 72   Ht 5' (1.524 m)   Wt 131 lb (59.4 kg) Comment: per pt  SpO2 99%   BMI 25.58 kg/m    Wt Readings from Last 3  Encounters:  03/02/23 131 lb (59.4 kg)  01/26/23 131 lb (59.4 kg)  01/06/23 125 lb (56.7 kg)     GEN: Well nourished, well developed in no acute distress NECK: No JVD CARDIAC: Normal rate and regular rhythm.  Left chest pacer incision well-healed with normal pocket. RESPIRATORY:  Clear to auscultation without rales, wheezing or rhonchi  ABDOMEN: Soft, non-tender, non-distended EXTREMITIES:  No edema; No deformity   ASSESSMENT AND PLAN:   Pamela Dunn is a 89 y.o. female with h/o hypertension, hyperlipidemia, chronic stage IIIa kidney disease, syncope and complete heart block on 07/11/2020 SP Abbott dual-chamber pacemaker implantation on 07/12/2020, severe DJD and RA with arthritis who is being seen today for to establish care for her pacemaker.   #Complete heart block s/p pacemaker:  -In clinic device interrogation was performed.  Normal device function and stable lead parameters. -Estimated longevity 7 years. -RA: Threshold 0.75V @0 .5ms, sensing 1.44mV, impedance 390ohms -RV: Threshold 0.75V@0 .5ms, sensing none (dependent at VVI 30bpm), impedance 550ohms -AP 73%, VP >99% -AT/AF burden 0%  -Continue regular remote monitoring.  #Nonischemic cardiomyopathy with recovered LVEF: -Well compensated currently. -NYHA class I. -Continue follow-up with Dr. Jacinto Halim.  Follow up with Dr. Jimmey Ralph in 12 months.  Signed, Nobie Putnam, MD

## 2023-03-03 DIAGNOSIS — N1831 Chronic kidney disease, stage 3a: Secondary | ICD-10-CM | POA: Diagnosis not present

## 2023-03-03 DIAGNOSIS — E46 Unspecified protein-calorie malnutrition: Secondary | ICD-10-CM | POA: Diagnosis not present

## 2023-03-03 DIAGNOSIS — I130 Hypertensive heart and chronic kidney disease with heart failure and stage 1 through stage 4 chronic kidney disease, or unspecified chronic kidney disease: Secondary | ICD-10-CM | POA: Diagnosis not present

## 2023-03-03 DIAGNOSIS — I5042 Chronic combined systolic (congestive) and diastolic (congestive) heart failure: Secondary | ICD-10-CM | POA: Diagnosis not present

## 2023-03-03 DIAGNOSIS — D631 Anemia in chronic kidney disease: Secondary | ICD-10-CM | POA: Diagnosis not present

## 2023-03-03 DIAGNOSIS — K529 Noninfective gastroenteritis and colitis, unspecified: Secondary | ICD-10-CM | POA: Diagnosis not present

## 2023-03-03 DIAGNOSIS — S51001D Unspecified open wound of right elbow, subsequent encounter: Secondary | ICD-10-CM | POA: Diagnosis not present

## 2023-03-03 DIAGNOSIS — S53124D Posterior dislocation of right ulnohumeral joint, subsequent encounter: Secondary | ICD-10-CM | POA: Diagnosis not present

## 2023-03-03 DIAGNOSIS — M069 Rheumatoid arthritis, unspecified: Secondary | ICD-10-CM | POA: Diagnosis not present

## 2023-03-06 ENCOUNTER — Ambulatory Visit
Admission: RE | Admit: 2023-03-06 | Discharge: 2023-03-06 | Disposition: A | Discharge status: Home / Self Care | Payer: Medicare PPO | Source: Ambulatory Visit

## 2023-03-06 DIAGNOSIS — R911 Solitary pulmonary nodule: Secondary | ICD-10-CM

## 2023-03-06 DIAGNOSIS — R918 Other nonspecific abnormal finding of lung field: Secondary | ICD-10-CM | POA: Diagnosis not present

## 2023-03-09 DIAGNOSIS — E46 Unspecified protein-calorie malnutrition: Secondary | ICD-10-CM | POA: Diagnosis not present

## 2023-03-09 DIAGNOSIS — S51001D Unspecified open wound of right elbow, subsequent encounter: Secondary | ICD-10-CM | POA: Diagnosis not present

## 2023-03-09 DIAGNOSIS — I5042 Chronic combined systolic (congestive) and diastolic (congestive) heart failure: Secondary | ICD-10-CM | POA: Diagnosis not present

## 2023-03-09 DIAGNOSIS — S53124D Posterior dislocation of right ulnohumeral joint, subsequent encounter: Secondary | ICD-10-CM | POA: Diagnosis not present

## 2023-03-09 DIAGNOSIS — N1831 Chronic kidney disease, stage 3a: Secondary | ICD-10-CM | POA: Diagnosis not present

## 2023-03-09 DIAGNOSIS — D631 Anemia in chronic kidney disease: Secondary | ICD-10-CM | POA: Diagnosis not present

## 2023-03-09 DIAGNOSIS — M069 Rheumatoid arthritis, unspecified: Secondary | ICD-10-CM | POA: Diagnosis not present

## 2023-03-09 DIAGNOSIS — K529 Noninfective gastroenteritis and colitis, unspecified: Secondary | ICD-10-CM | POA: Diagnosis not present

## 2023-03-09 DIAGNOSIS — I130 Hypertensive heart and chronic kidney disease with heart failure and stage 1 through stage 4 chronic kidney disease, or unspecified chronic kidney disease: Secondary | ICD-10-CM | POA: Diagnosis not present

## 2023-03-10 DIAGNOSIS — S51001D Unspecified open wound of right elbow, subsequent encounter: Secondary | ICD-10-CM | POA: Diagnosis not present

## 2023-03-10 DIAGNOSIS — N1831 Chronic kidney disease, stage 3a: Secondary | ICD-10-CM | POA: Diagnosis not present

## 2023-03-10 DIAGNOSIS — K529 Noninfective gastroenteritis and colitis, unspecified: Secondary | ICD-10-CM | POA: Diagnosis not present

## 2023-03-10 DIAGNOSIS — I130 Hypertensive heart and chronic kidney disease with heart failure and stage 1 through stage 4 chronic kidney disease, or unspecified chronic kidney disease: Secondary | ICD-10-CM | POA: Diagnosis not present

## 2023-03-10 DIAGNOSIS — S53124D Posterior dislocation of right ulnohumeral joint, subsequent encounter: Secondary | ICD-10-CM | POA: Diagnosis not present

## 2023-03-10 DIAGNOSIS — D631 Anemia in chronic kidney disease: Secondary | ICD-10-CM | POA: Diagnosis not present

## 2023-03-10 DIAGNOSIS — I5042 Chronic combined systolic (congestive) and diastolic (congestive) heart failure: Secondary | ICD-10-CM | POA: Diagnosis not present

## 2023-03-10 DIAGNOSIS — M069 Rheumatoid arthritis, unspecified: Secondary | ICD-10-CM | POA: Diagnosis not present

## 2023-03-10 DIAGNOSIS — E46 Unspecified protein-calorie malnutrition: Secondary | ICD-10-CM | POA: Diagnosis not present

## 2023-03-13 DIAGNOSIS — S53124D Posterior dislocation of right ulnohumeral joint, subsequent encounter: Secondary | ICD-10-CM | POA: Diagnosis not present

## 2023-03-13 DIAGNOSIS — K529 Noninfective gastroenteritis and colitis, unspecified: Secondary | ICD-10-CM | POA: Diagnosis not present

## 2023-03-13 DIAGNOSIS — M069 Rheumatoid arthritis, unspecified: Secondary | ICD-10-CM | POA: Diagnosis not present

## 2023-03-13 DIAGNOSIS — E46 Unspecified protein-calorie malnutrition: Secondary | ICD-10-CM | POA: Diagnosis not present

## 2023-03-13 DIAGNOSIS — N1831 Chronic kidney disease, stage 3a: Secondary | ICD-10-CM | POA: Diagnosis not present

## 2023-03-13 DIAGNOSIS — I130 Hypertensive heart and chronic kidney disease with heart failure and stage 1 through stage 4 chronic kidney disease, or unspecified chronic kidney disease: Secondary | ICD-10-CM | POA: Diagnosis not present

## 2023-03-13 DIAGNOSIS — I5042 Chronic combined systolic (congestive) and diastolic (congestive) heart failure: Secondary | ICD-10-CM | POA: Diagnosis not present

## 2023-03-13 DIAGNOSIS — D631 Anemia in chronic kidney disease: Secondary | ICD-10-CM | POA: Diagnosis not present

## 2023-03-13 DIAGNOSIS — S51001D Unspecified open wound of right elbow, subsequent encounter: Secondary | ICD-10-CM | POA: Diagnosis not present

## 2023-03-16 ENCOUNTER — Emergency Department (HOSPITAL_COMMUNITY)
Admission: EM | Admit: 2023-03-16 | Discharge: 2023-03-17 | Disposition: A | Discharge status: Home / Self Care | Payer: Medicare PPO | Attending: Emergency Medicine | Admitting: Emergency Medicine

## 2023-03-16 ENCOUNTER — Emergency Department (HOSPITAL_COMMUNITY): Payer: Medicare PPO

## 2023-03-16 ENCOUNTER — Encounter (HOSPITAL_COMMUNITY): Payer: Self-pay

## 2023-03-16 ENCOUNTER — Other Ambulatory Visit: Payer: Self-pay

## 2023-03-16 DIAGNOSIS — E039 Hypothyroidism, unspecified: Secondary | ICD-10-CM | POA: Insufficient documentation

## 2023-03-16 DIAGNOSIS — Z7901 Long term (current) use of anticoagulants: Secondary | ICD-10-CM | POA: Diagnosis not present

## 2023-03-16 DIAGNOSIS — M06079 Rheumatoid arthritis without rheumatoid factor, unspecified ankle and foot: Secondary | ICD-10-CM | POA: Diagnosis not present

## 2023-03-16 DIAGNOSIS — I5042 Chronic combined systolic (congestive) and diastolic (congestive) heart failure: Secondary | ICD-10-CM | POA: Diagnosis not present

## 2023-03-16 DIAGNOSIS — M79671 Pain in right foot: Secondary | ICD-10-CM | POA: Diagnosis not present

## 2023-03-16 DIAGNOSIS — M25571 Pain in right ankle and joints of right foot: Secondary | ICD-10-CM | POA: Diagnosis not present

## 2023-03-16 DIAGNOSIS — M069 Rheumatoid arthritis, unspecified: Secondary | ICD-10-CM | POA: Insufficient documentation

## 2023-03-16 DIAGNOSIS — M19072 Primary osteoarthritis, left ankle and foot: Secondary | ICD-10-CM | POA: Diagnosis not present

## 2023-03-16 DIAGNOSIS — I509 Heart failure, unspecified: Secondary | ICD-10-CM | POA: Diagnosis not present

## 2023-03-16 DIAGNOSIS — Z950 Presence of cardiac pacemaker: Secondary | ICD-10-CM | POA: Insufficient documentation

## 2023-03-16 DIAGNOSIS — M79673 Pain in unspecified foot: Secondary | ICD-10-CM | POA: Diagnosis not present

## 2023-03-16 DIAGNOSIS — M25572 Pain in left ankle and joints of left foot: Secondary | ICD-10-CM | POA: Diagnosis not present

## 2023-03-16 DIAGNOSIS — E46 Unspecified protein-calorie malnutrition: Secondary | ICD-10-CM | POA: Diagnosis not present

## 2023-03-16 DIAGNOSIS — S51001D Unspecified open wound of right elbow, subsequent encounter: Secondary | ICD-10-CM | POA: Diagnosis not present

## 2023-03-16 DIAGNOSIS — I130 Hypertensive heart and chronic kidney disease with heart failure and stage 1 through stage 4 chronic kidney disease, or unspecified chronic kidney disease: Secondary | ICD-10-CM | POA: Diagnosis not present

## 2023-03-16 DIAGNOSIS — M19071 Primary osteoarthritis, right ankle and foot: Secondary | ICD-10-CM | POA: Diagnosis not present

## 2023-03-16 DIAGNOSIS — N1831 Chronic kidney disease, stage 3a: Secondary | ICD-10-CM | POA: Diagnosis not present

## 2023-03-16 DIAGNOSIS — S53124D Posterior dislocation of right ulnohumeral joint, subsequent encounter: Secondary | ICD-10-CM | POA: Diagnosis not present

## 2023-03-16 DIAGNOSIS — D631 Anemia in chronic kidney disease: Secondary | ICD-10-CM | POA: Diagnosis not present

## 2023-03-16 DIAGNOSIS — M79672 Pain in left foot: Secondary | ICD-10-CM | POA: Diagnosis not present

## 2023-03-16 DIAGNOSIS — K529 Noninfective gastroenteritis and colitis, unspecified: Secondary | ICD-10-CM | POA: Diagnosis not present

## 2023-03-16 MED ORDER — FENTANYL CITRATE PF 50 MCG/ML IJ SOSY
25.0000 ug | PREFILLED_SYRINGE | Freq: Once | INTRAMUSCULAR | Status: AC
  Administered 2023-03-16: 25 ug via INTRAVENOUS
  Filled 2023-03-16: qty 1

## 2023-03-16 NOTE — ED Notes (Signed)
 Phlebotomy and RN unable to attain blood samples because PT is filled with fluid and will not allow [place] to tie the tourniquet.

## 2023-03-16 NOTE — ED Notes (Signed)
Pt refused BP to be taken.

## 2023-03-16 NOTE — ED Notes (Signed)
 Unsuccessful venipuncture...KM

## 2023-03-16 NOTE — ED Provider Notes (Signed)
 [place] [facility] Provider Note   CSN: [id number] Arrival date & time: 03/16/23  1716     History  Chief Complaint  Patient presents with   Foot Pain    Pamela Dunn is a 89 y.o. female.   Foot Pain  Patient presents to the ED for bilateral foot and ankle pain that has been going on for the last 10 days after her rheumatoid a doctor lowered her prednisone  for her rheumatoid arthritis due to unhealing wounds on her left lower leg and her right forearm.  Previous medical history of rheumatoid arthritis, GERD, peptic ulcer disease, pulmonary nodules, hypothyroidism, CHF, pacemaker, complete AV block.  Currently on Eliquis  for DVTs bilaterally.   Denies chest pain, shortness of breath.     Home Medications Prior to Admission medications   Medication Sig Start Date End Date Taking? Authorizing Provider  apixaban  (ELIQUIS ) 5 MG TABS tablet Take 2 tablets (10mg ) twice daily for 7 days, then 1 tablet (5mg ) twice daily 02/04/23   Ladona Heinz, MD  apixaban  (ELIQUIS ) 5 MG TABS tablet Take 1 tablet (5 mg total) by mouth 2 (two) times daily. Place on hold- fill after starter dose 02/04/23   Ladona Heinz, MD  calcium  carbonate (TUMS - DOSED IN MG ELEMENTAL CALCIUM ) 500 MG chewable tablet Chew 1 tablet by mouth daily.    [provider]  cholecalciferol  (VITAMIN D ) 1000 UNITS tablet Take 1,000 Units by mouth daily.     [provider]  Coenzyme Q10 (COQ10) 200 MG CAPS Take 200 mg by mouth daily.    [provider]  diclofenac  Sodium (VOLTAREN ) 1 % GEL Apply 2 g topically 4 (four) times daily. 01/09/23   Lue Elsie BROCKS, MD  ezetimibe  (ZETIA ) 10 MG tablet Take 10 mg by mouth daily.     [provider]  folic acid  (FOLVITE ) 1 MG tablet Take 1 mg by mouth 2 (two) times daily. 02/09/23   [provider]  isosorbide -hydrALAZINE  (BIDIL ) 20-37.5 MG tablet TAKE 1 TABLET THREE TIMES DAILY Patient taking differently:  Take 1 tablet by mouth in the morning and at bedtime. 08/21/22   Ladona Heinz, MD  levothyroxine  (SYNTHROID ) 25 MCG tablet Take 25 mcg by mouth daily.     [provider]  metoprolol  succinate (TOPROL -XL) 25 MG 24 hr tablet Take 1 tablet (25 mg total) by mouth daily. 12/15/22   Ladona Heinz, MD  Omega-3 Fatty Acids (OMEGA-3 FISH OIL PO) Take 1 capsule by mouth in the morning and at bedtime.    [provider]  ondansetron  (ZOFRAN -ODT) 4 MG disintegrating tablet Take 4 mg by mouth every 8 (eight) hours as needed. 01/20/23   [provider]  polyethylene glycol (MIRALAX  / GLYCOLAX ) 17 g packet Take 17 g by mouth daily.    [provider]  predniSONE  (DELTASONE ) 5 MG tablet Take 5 mg by mouth daily. Taking 30 mg now that sulfasalazine  has been stopped 11/03/18   [provider]      Allergies    Coreg  [carvedilol ] and Lorazepam    Review of Systems   Review of Systems  Musculoskeletal:  Positive for arthralgias and joint swelling.  Skin:  Positive for wound.  All other systems reviewed and are negative.   Physical Exam Updated Vital Signs Pulse 81   Temp 99.2 F (37.3 C) (Oral)   Resp 17   SpO2 97%  Physical Exam Vitals and nursing note reviewed.  Constitutional:  Appearance: Normal appearance.  HENT:     Head: Normocephalic and atraumatic.  Eyes:     Extraocular Movements: Extraocular movements intact.     Conjunctiva/sclera: Conjunctivae normal.  Cardiovascular:     Rate and Rhythm: Normal rate and regular rhythm.     Pulses: Normal pulses.     Heart sounds: Normal heart sounds. No murmur heard.    No friction rub. No gallop.  Pulmonary:     Effort: Pulmonary effort is normal. No respiratory distress.     Breath sounds: Normal breath sounds.  Abdominal:     General: Abdomen is flat.     Palpations: Abdomen is soft.     Tenderness: There is no abdominal tenderness.  Musculoskeletal:        General: Tenderness (Tenderness noted  over calves bilaterally as well as plantar aspects of the feet and lateral malleolus bilaterally), deformity (Ankles bilaterally are edematous and hot to the touch.) and signs of injury (Unhealing ulcers noted to the left lower leg and right forearm) present.  Skin:    General: Skin is warm and dry.     Coloration: Skin is not jaundiced.     Findings: Bruising (Bruising noted over hands bilaterally and feet bilaterally) present.  Neurological:     General: No focal deficit present.     Mental Status: She is alert. Mental status is at baseline.  Psychiatric:        Mood and Affect: Mood normal.     ED Results / Procedures / Treatments   Labs (all labs ordered are listed, but only abnormal results are displayed) Labs Reviewed  CBC WITH DIFFERENTIAL/PLATELET  SEDIMENTATION RATE  C-REACTIVE PROTEIN  COMPREHENSIVE METABOLIC PANEL  BRAIN NATRIURETIC PEPTIDE  CK    EKG None  Radiology DG Ankle Complete Left Result Date: 03/16/2023 CLINICAL DATA:  Pain.  History of rheumatoid arthritis EXAM: LEFT ANKLE COMPLETE - 3+ VIEW COMPARISON:  10/13/2022 FINDINGS: Old healed distal fibular shaft fracture. Advanced arthritis of the ankle joint and subtalar joint with partial collapse of the talar dome. Findings are stable since prior study. No visible acute fracture, subluxation or dislocation. IMPRESSION: Advanced arthritis of the left ankle and hindfoot. No visible acute bony abnormality. Electronically Signed   By: Franky Crease M.D.   On: 03/16/2023 21:14   DG Ankle Complete Right Result Date: 03/16/2023 CLINICAL DATA:  Pain.  History of rheumatoid arthritis EXAM: RIGHT ANKLE - COMPLETE 3+ VIEW COMPARISON:  08/04/2018 FINDINGS: Old healed distal fibular shaft fracture. Advanced arthritic changes in the ankle joint and subtalar joint. There appears to be partial collapse of the talar dome. No acute fracture, subluxation or dislocation. IMPRESSION: Advanced arthritic changes in the right ankle with  partial collapse of the talar dome. No acute bony abnormality. Electronically Signed   By: Franky Crease M.D.   On: 03/16/2023 21:13   DG Foot Complete Right Result Date: 03/16/2023 CLINICAL DATA:  Pain, rheumatoid arthritis EXAM: RIGHT FOOT COMPLETE - 3+ VIEW COMPARISON:  None Available. FINDINGS: The toes are suboptimally evaluated due to positioning. Advanced arthritic changes throughout the foot including MCP joints, midfoot and hindfoot. Advanced arthritic changes at the ankle joint. No visible acute fracture, subluxation or dislocation. IMPRESSION: Advanced arthritic changes throughout the right foot. No visible definite acute process. Electronically Signed   By: Franky Crease M.D.   On: 03/16/2023 21:12   DG Foot Complete Left Result Date: 03/16/2023 CLINICAL DATA:  Foot pain.  History of rheumatoid arthritis. EXAM: LEFT FOOT -  COMPLETE 3+ VIEW COMPARISON:  None Available. FINDINGS: The toes are suboptimally visualized due to positioning. There appears to be advanced arthritic changes in the MCP joints. Advanced arthritic changes in the midfoot and hindfoot. No acute fracture, subluxation or dislocation visualized. IMPRESSION: Advanced arthritic changes throughout the left foot. No visible acute bony abnormality. Electronically Signed   By: Franky Crease M.D.   On: 03/16/2023 21:10    Procedures Procedures    Medications Ordered in ED Medications  fentaNYL  (SUBLIMAZE ) injection 25 mcg (has no administration in time range)    ED Course/ Medical Decision Making/ A&P                                 Medical Decision Making Amount and/or Complexity of Data Reviewed Labs: ordered. Radiology: ordered.   This patient is a 89 year old female who presents to the ED for concern of bilateral lower ankle and foot pain.   Differential diagnoses prior to evaluation: The emergent differential diagnosis includes, but is not limited to, rheumatoid arthritis flare, septic joint, worsening DVT, gout,  prednisone  withdrawal. This is not an exhaustive differential.   Past Medical History / Co-morbidities / Social History: rheumatoid arthritis, GERD, peptic ulcer disease, pulmonary nodules, hypothyroidism, CHF, pacemaker, complete AV block.  Additional history: Chart reviewed. Pertinent results include: Last ultrasound of upper extremities on 02/13/2023 showed chronic DVT in the left subclavian vein and acute occlusive DVT in the left axillary and brachial veins.   Lab Tests/Imaging studies: I personally interpreted labs/imaging and the pertinent results include:   CK pending BMP pending CMP pending Sed rate pending C-reactive protein pending CT CBC  Left ankle x-rays show advanced arthritis of the bone with no acute abnormality.  X-rays right ankle show arthritic changes with partial collapse of the talar dome but no acute bony abnormalities.  X-ray of the right foot showed advanced with any changes but no acute abnormality  X-ray of left left foot shows arthritic changes of the left foot with no acute abnormality I agree with the radiologist interpretation.    Medications: I ordered medication including fentanyl  was given for pain.  I have reviewed the patients home medicines and have made adjustments as needed.   ED Course:   Patient is an 89 year old female presents to the ED for bilateral ankle swelling and pain with bilateral foot pain.  She has been diagnosed with rheumatoid arthritis and had her prednisone  lowered by her rheumatologist due to unhealing wounds on her right forearm and left calf.  However when she stopped taking the medication, the pain began to become so severe that she could not walk.  She has not walked for the last 10 days.  Currently on Eliquis  for chronic DVTs.  She was last evaluated and shown to have an upper extremity DVT on 02/13/2023.  Denies chest pain, shortness of breath, abdominal pain, and otherwise appears very stable at this time.  On physical  exam both ankles were edematous and hot to the touch.  She has noticeable wounds on her right arm and left lower leg that have been wrapped.  Patient did have lower extremity tenderness to both calves when squeezed.  X-rays were done of her feet and ankle bilaterally which showed chronic degenerative changes from the arthritis with no acute abnormalities.  Labs were put in to be done however due to her habitus became very difficult for labs to be drawn and thus needed  IV team for ultrasound IV.  Labs are unable to be obtained at this time.  Pain medication was also ordered to help with the bilateral lower limb pain.  Low suspicion for PE, stroke as patient is able to move limbs bilaterally and endorses sensation bilaterally.  However will most likely move towards trying to admit patient due to failure to thrive, not walking for the last 10 days, chronic DVTs.  Patient patient care is going to be transferred over to attending.   Disposition: 10:18 PM Care of Pamela Dunn transferred to Dr. Ginger  at the end of my shift as the patient will require reassessment once labs/imaging have resulted. Patient presentation, ED course, and plan of care discussed with review of all pertinent labs and imaging. Please see his/her note for further details regarding further ED course and disposition. Plan at time of handoff is to obtain labs and move towards admission as patient has had no ability to walk due to pain and due to chronic DVTs is at a high risk for PE, stroke plus needing pain management or care for failure to thrive. This may be altered or completely changed at the discretion of the oncoming team pending results of further workup.   Final Clinical Impression(s) / ED Diagnoses Final diagnoses:  Rheumatoid arthritis flare [facility])    Rx / DC Orders ED Discharge Orders     None         Shannon Kirkendall S, PA-C 03/16/23 2227    Tegeler, Lonni PARAS, MD 03/16/23 [phone]

## 2023-03-16 NOTE — ED Triage Notes (Signed)
 Patient c/o foot pain from RA after her provider decreased her prednisone . Patient has hx of same and was taken off prednisone  to encourage healing of wounds but has resulted in her being bed bound for 5 days d/t pain. Patient refuses BP measurement. HR 84, 96% RA, RR 20,

## 2023-03-16 NOTE — ED Provider Notes (Signed)
 Care assumed from [facility].  At time of transfer of care, patient is awaiting results of lab testing and reassessment prior to admission for bilateral leg pain due to known bilateral DVTs and suspected rheumatoid arthritis flare due to the tapering steroid regimen by her rheumatologist over the last 2 weeks.  Patient has not been able to ambulate in 10 days and thus is not safe for discharge home.  When labs have returned, will call for admission.     Sharif Rendell, Lonni PARAS, MD 03/17/23 0030

## 2023-03-17 DIAGNOSIS — S51001D Unspecified open wound of right elbow, subsequent encounter: Secondary | ICD-10-CM | POA: Diagnosis not present

## 2023-03-17 DIAGNOSIS — S53124D Posterior dislocation of right ulnohumeral joint, subsequent encounter: Secondary | ICD-10-CM | POA: Diagnosis not present

## 2023-03-17 DIAGNOSIS — I130 Hypertensive heart and chronic kidney disease with heart failure and stage 1 through stage 4 chronic kidney disease, or unspecified chronic kidney disease: Secondary | ICD-10-CM | POA: Diagnosis not present

## 2023-03-17 DIAGNOSIS — M069 Rheumatoid arthritis, unspecified: Secondary | ICD-10-CM | POA: Diagnosis not present

## 2023-03-17 DIAGNOSIS — D631 Anemia in chronic kidney disease: Secondary | ICD-10-CM | POA: Diagnosis not present

## 2023-03-17 DIAGNOSIS — E46 Unspecified protein-calorie malnutrition: Secondary | ICD-10-CM | POA: Diagnosis not present

## 2023-03-17 DIAGNOSIS — I5042 Chronic combined systolic (congestive) and diastolic (congestive) heart failure: Secondary | ICD-10-CM | POA: Diagnosis not present

## 2023-03-17 DIAGNOSIS — K529 Noninfective gastroenteritis and colitis, unspecified: Secondary | ICD-10-CM | POA: Diagnosis not present

## 2023-03-17 DIAGNOSIS — N1831 Chronic kidney disease, stage 3a: Secondary | ICD-10-CM | POA: Diagnosis not present

## 2023-03-17 LAB — CBC WITH DIFFERENTIAL/PLATELET
Abs Immature Granulocytes: 0.64 10*3/uL — ABNORMAL HIGH (ref 0.00–0.07)
Basophils Absolute: 0.1 10*3/uL (ref 0.0–0.1)
Basophils Relative: 1 %
Eosinophils Absolute: 0.1 10*3/uL (ref 0.0–0.5)
Eosinophils Relative: 1 %
HCT: 32 % — ABNORMAL LOW (ref 36.0–46.0)
Hemoglobin: 10.8 g/dL — ABNORMAL LOW (ref 12.0–15.0)
Immature Granulocytes: 8 %
Lymphocytes Relative: 12 %
Lymphs Abs: 1 10*3/uL (ref 0.7–4.0)
MCH: 31.9 pg (ref 26.0–34.0)
MCHC: 33.8 g/dL (ref 30.0–36.0)
MCV: 94.4 fL (ref 80.0–100.0)
Monocytes Absolute: 0.6 10*3/uL (ref 0.1–1.0)
Monocytes Relative: 7 %
Neutro Abs: 5.9 10*3/uL (ref 1.7–7.7)
Neutrophils Relative %: 71 %
Platelets: 315 10*3/uL (ref 150–400)
RBC: 3.39 MIL/uL — ABNORMAL LOW (ref 3.87–5.11)
RDW: 16.8 % — ABNORMAL HIGH (ref 11.5–15.5)
Smear Review: NORMAL
WBC: 8.3 10*3/uL (ref 4.0–10.5)
nRBC: 0 % (ref 0.0–0.2)

## 2023-03-17 LAB — COMPREHENSIVE METABOLIC PANEL
ALT: 22 U/L (ref 0–44)
AST: 23 U/L (ref 15–41)
Albumin: 1.9 g/dL — ABNORMAL LOW (ref 3.5–5.0)
Alkaline Phosphatase: 40 U/L (ref 38–126)
Anion gap: 9 (ref 5–15)
BUN: 24 mg/dL — ABNORMAL HIGH (ref 8–23)
CO2: 24 mmol/L (ref 22–32)
Calcium: 8.3 mg/dL — ABNORMAL LOW (ref 8.9–10.3)
Chloride: 102 mmol/L (ref 98–111)
Creatinine, Ser: 1.16 mg/dL — ABNORMAL HIGH (ref 0.44–1.00)
GFR, Estimated: 45 mL/min — ABNORMAL LOW (ref >60)
Glucose, Bld: 80 mg/dL (ref 70–99)
Potassium: 4.3 mmol/L (ref 3.5–5.1)
Sodium: 135 mmol/L (ref 135–145)
Total Bilirubin: 0.9 mg/dL (ref 0.0–1.2)
Total Protein: 5 g/dL — ABNORMAL LOW (ref 6.5–8.1)

## 2023-03-17 LAB — CK: Total CK: 21 U/L — ABNORMAL LOW (ref 38–234)

## 2023-03-17 LAB — C-REACTIVE PROTEIN: CRP: 21.9 mg/dL — ABNORMAL HIGH (ref <1.0)

## 2023-03-17 LAB — BRAIN NATRIURETIC PEPTIDE: B Natriuretic Peptide: 120.3 pg/mL — ABNORMAL HIGH (ref 0.0–100.0)

## 2023-03-17 LAB — SEDIMENTATION RATE: Sed Rate: 76 mm/h — ABNORMAL HIGH (ref 0–22)

## 2023-03-17 MED ORDER — OXYCODONE-ACETAMINOPHEN 5-325 MG PO TABS
1.0000 | ORAL_TABLET | ORAL | Status: DC | PRN
  Administered 2023-03-17: 1 via ORAL
  Filled 2023-03-17: qty 1

## 2023-03-17 MED ORDER — OXYCODONE-ACETAMINOPHEN 5-325 MG PO TABS: 1.0000 | ORAL_TABLET | Freq: Four times a day (QID) | ORAL | 0 refills | Status: AC | PRN

## 2023-03-17 MED ORDER — PREDNISONE 20 MG PO TABS
20.0000 mg | ORAL_TABLET | Freq: Once | ORAL | Status: AC
  Administered 2023-03-17: 20 mg via ORAL
  Filled 2023-03-17: qty 1

## 2023-03-17 NOTE — Discharge Instructions (Signed)
 Resume taking your normal dose of prednisone .  Call your rheumatologist today to discuss medications for better management of your rheumatoid arthritis.  A prescription for narcotic pain medication was sent to your pharmacy.  Take this only as needed.  Return to the emergency department for any new or worsening symptoms of concern.

## 2023-03-17 NOTE — ED Provider Notes (Signed)
 Care of patient assumed from Dr. Imelda.  This patient has a history of rheumatoid arthritis.  She has had difficulty walking over the past week and a half due to pain in her feet.  She was recently tapered down on her anti-inflammatory medications due to poor wound healing of an elbow wound. Physical Exam  Pulse 81   Temp 99.2 F (37.3 C) (Oral)   Resp 17   Ht 5' (1.524 m)   Wt 59.4 kg   SpO2 97%   BMI 25.58 kg/m   Physical Exam Vitals and nursing note reviewed.  Constitutional:      General: She is not in acute distress.    Appearance: Normal appearance. She is well-developed. She is not toxic-appearing or diaphoretic.  HENT:     Head: Normocephalic and atraumatic.     Right Ear: External ear normal.     Left Ear: External ear normal.     Nose: Nose normal.     Mouth/Throat:     Mouth: Mucous membranes are moist.  Eyes:     Extraocular Movements: Extraocular movements intact.     Conjunctiva/sclera: Conjunctivae normal.  Cardiovascular:     Rate and Rhythm: Normal rate and regular rhythm.  Pulmonary:     Effort: Pulmonary effort is normal. No respiratory distress.  Abdominal:     General: There is no distension.     Palpations: Abdomen is soft.  Musculoskeletal:        General: No swelling.     Cervical back: Normal range of motion and neck supple.     Comments: Chronic joint deformities consistent with history of rheumatoid arthritis.  Skin:    General: Skin is warm and dry.     Findings: Bruising present.     Comments: Superficial wounds to the right elbow and left lower leg.  No purulent drainage, no surrounding erythema  Neurological:     General: No focal deficit present.     Mental Status: She is alert and oriented to person, place, and time.  Psychiatric:        Mood and Affect: Mood normal.        Behavior: Behavior normal.        Procedures  Procedures  ED Course / MDM    Medical Decision Making Amount and/or Complexity of Data Reviewed Labs:  ordered. Radiology: ordered.  Risk Prescription drug management.   On assessment, patient is resting on ED stretcher.  Her family member is at bedside.  Currently, she reports improved pain after ED dose of fentanyl .  Recent history is as follows: She was ambulatory and living independently up until late October.  In late October, she was admitted to the hospital for generalized weakness.  She was treated for colitis with antibiotics.  She had right elbow fracture which Ortho felt was subacute.  She had continued ambulatory dysfunction and weakness and was discharged to nursing facility.  She has since returned home.  Currently, she has multiple home health caregivers during the day, home PT, and home OT.  She has had a progressive decline in her ambulation and transfer ability over the past week and a half.  This is due to pain in her bilateral feet.  She has a history of rheumatoid arthritis and was previously on 30 mg daily prednisone  as well as salicylate.  Her rheumatologist weaned her down to 5 mg of prednisone  daily over the past 2 weeks due to poor wound healing of right elbow skin tear and  left lower leg hematoma.  Her left leg hematoma has improved.  Her elbow wound has not worsened but is slow healing.  I suspect that the worsening of foot pain leading to increasing difficulty ambulating is secondary to what is now undertreated rheumatoid arthritis.  20 mg dose of prednisone  was given in the ED.  As needed Percocet was ordered.  I spoke with hospitalist, Dr. Sundil, regarding admission.  Given that there are no rheumatologists on-call, Dr. Lee does not feel comfortable with this admission.  I had a shared decision-making discussion with patient and family member.  Patient feels that her difficulty with walking is only secondary to pain.  She is currently not taking anything for pain.  She was recently started on Eliquis  for DVTs.  Because of this, she has not wanted to take any pain medication.   She does not have any narcotic pain medication at home.  Patient had a recent stay in nursing facility and has no desire to go back to a nursing facility.  Patient and son state that she does have multiple caregivers at home that can continue to help her with walking and transfers.  She does request discharge home with as needed pain medication.  Plan will be to prescribe Percocet and have patient resume her higher dose of prednisone .  Patient to reach out to her rheumatologist for further guidance on management of her RA.  Although the immunosuppressive medications may cause delayed wound healing, there is likely more benefit than risk given her decline over the past week.  Patient and family member requested discharge home at 8:00, when her home health aides will be there to help her.  Patient remained in the ED overnight.  She was discharged in the morning in stable condition.       Melvenia Motto, MD 03/17/23 [phone]

## 2023-03-17 NOTE — ED Notes (Signed)
 Pt refused blood pressure at this time. Stated her arms were painful.

## 2023-03-17 NOTE — Plan of Care (Addendum)
 Pamela Dunn is a 89 y.o. female with medical history significant of history of DVT on Eliquis , essential hypertension, hyperlipidemia, CKD stage III, rheumatoid arthritis, complete heart block status post pacemaker, nonischemic cardiomyopathy recovered EF, hyperlipidemia, hypothyroidism, history of SBO status post small bowel resection June 2021, and GERD presented to emergency department ongoing foot and ankle pain which has been lasting for last 10 days since patient's rheumatologist has tapered down prednisone  due to unhealing wound of the left lower leg and right forearm.  Due to severe bilateral foot and ankle pain patient is currently unable to bear  weight and bedbound.   Lab work in the ED, CMP showed creatinine 1.16 and GFR 45 which is baseline, calcium  8.3. BNP 120.  CK21.  C-reactive protein 21.9.  ESR pending.  X-ray of the right ankle showed: Advanced arthritic changes in the right ankle with partial collapse of the talar dome. No acute bony abnormality.  X-ray of the left ankle: Advanced arthritis of the left ankle and hindfoot. No visible acute bony abnormality.  Patient used to be on prednisone  30 mg  daily which has been gradually tapered down to 5 mg.  Patient has been off prednisone  for last 10 days days given patient has concern nonhealing chronic wounds.  Patient used to be on sulfasalazine  which has been stopped by rheumatologist as there is high risk of interaction of bleeding due to concurrent use of Eliquis .  Patient follows rheumatology with [facility].    Given patient's clinical presentation correlating with rheumatoid arthritis flare and at this moment we do not have any rheumatologist consulting service available in our hospital I have requested ED physician Dr. Melvenia to transfer patient to facility with rheumatology service/consult unless rheumatologist recommends to manage the patient at [facility] with recommendation to treat as well.    Deferring admission at this time.

## 2023-03-19 DIAGNOSIS — I5042 Chronic combined systolic (congestive) and diastolic (congestive) heart failure: Secondary | ICD-10-CM | POA: Diagnosis not present

## 2023-03-19 DIAGNOSIS — S53124D Posterior dislocation of right ulnohumeral joint, subsequent encounter: Secondary | ICD-10-CM | POA: Diagnosis not present

## 2023-03-19 DIAGNOSIS — I130 Hypertensive heart and chronic kidney disease with heart failure and stage 1 through stage 4 chronic kidney disease, or unspecified chronic kidney disease: Secondary | ICD-10-CM | POA: Diagnosis not present

## 2023-03-19 DIAGNOSIS — K529 Noninfective gastroenteritis and colitis, unspecified: Secondary | ICD-10-CM | POA: Diagnosis not present

## 2023-03-19 DIAGNOSIS — E46 Unspecified protein-calorie malnutrition: Secondary | ICD-10-CM | POA: Diagnosis not present

## 2023-03-19 DIAGNOSIS — N1831 Chronic kidney disease, stage 3a: Secondary | ICD-10-CM | POA: Diagnosis not present

## 2023-03-19 DIAGNOSIS — D631 Anemia in chronic kidney disease: Secondary | ICD-10-CM | POA: Diagnosis not present

## 2023-03-19 DIAGNOSIS — M069 Rheumatoid arthritis, unspecified: Secondary | ICD-10-CM | POA: Diagnosis not present

## 2023-03-19 DIAGNOSIS — S51001D Unspecified open wound of right elbow, subsequent encounter: Secondary | ICD-10-CM | POA: Diagnosis not present

## 2023-03-24 DIAGNOSIS — N1831 Chronic kidney disease, stage 3a: Secondary | ICD-10-CM | POA: Diagnosis not present

## 2023-03-24 DIAGNOSIS — I5042 Chronic combined systolic (congestive) and diastolic (congestive) heart failure: Secondary | ICD-10-CM | POA: Diagnosis not present

## 2023-03-24 DIAGNOSIS — S51001D Unspecified open wound of right elbow, subsequent encounter: Secondary | ICD-10-CM | POA: Diagnosis not present

## 2023-03-24 DIAGNOSIS — S53124D Posterior dislocation of right ulnohumeral joint, subsequent encounter: Secondary | ICD-10-CM | POA: Diagnosis not present

## 2023-03-24 DIAGNOSIS — M069 Rheumatoid arthritis, unspecified: Secondary | ICD-10-CM | POA: Diagnosis not present

## 2023-03-24 DIAGNOSIS — I130 Hypertensive heart and chronic kidney disease with heart failure and stage 1 through stage 4 chronic kidney disease, or unspecified chronic kidney disease: Secondary | ICD-10-CM | POA: Diagnosis not present

## 2023-03-24 DIAGNOSIS — E46 Unspecified protein-calorie malnutrition: Secondary | ICD-10-CM | POA: Diagnosis not present

## 2023-03-24 DIAGNOSIS — K529 Noninfective gastroenteritis and colitis, unspecified: Secondary | ICD-10-CM | POA: Diagnosis not present

## 2023-03-24 DIAGNOSIS — D631 Anemia in chronic kidney disease: Secondary | ICD-10-CM | POA: Diagnosis not present

## 2023-03-26 DIAGNOSIS — S53124D Posterior dislocation of right ulnohumeral joint, subsequent encounter: Secondary | ICD-10-CM | POA: Diagnosis not present

## 2023-03-26 DIAGNOSIS — I130 Hypertensive heart and chronic kidney disease with heart failure and stage 1 through stage 4 chronic kidney disease, or unspecified chronic kidney disease: Secondary | ICD-10-CM | POA: Diagnosis not present

## 2023-03-26 DIAGNOSIS — N1831 Chronic kidney disease, stage 3a: Secondary | ICD-10-CM | POA: Diagnosis not present

## 2023-03-26 DIAGNOSIS — K529 Noninfective gastroenteritis and colitis, unspecified: Secondary | ICD-10-CM | POA: Diagnosis not present

## 2023-03-26 DIAGNOSIS — S51001D Unspecified open wound of right elbow, subsequent encounter: Secondary | ICD-10-CM | POA: Diagnosis not present

## 2023-03-26 DIAGNOSIS — E46 Unspecified protein-calorie malnutrition: Secondary | ICD-10-CM | POA: Diagnosis not present

## 2023-03-26 DIAGNOSIS — M069 Rheumatoid arthritis, unspecified: Secondary | ICD-10-CM | POA: Diagnosis not present

## 2023-03-26 DIAGNOSIS — I5042 Chronic combined systolic (congestive) and diastolic (congestive) heart failure: Secondary | ICD-10-CM | POA: Diagnosis not present

## 2023-03-26 DIAGNOSIS — D631 Anemia in chronic kidney disease: Secondary | ICD-10-CM | POA: Diagnosis not present

## 2023-03-31 DIAGNOSIS — I130 Hypertensive heart and chronic kidney disease with heart failure and stage 1 through stage 4 chronic kidney disease, or unspecified chronic kidney disease: Secondary | ICD-10-CM | POA: Diagnosis not present

## 2023-03-31 DIAGNOSIS — E039 Hypothyroidism, unspecified: Secondary | ICD-10-CM | POA: Diagnosis not present

## 2023-03-31 DIAGNOSIS — S81802D Unspecified open wound, left lower leg, subsequent encounter: Secondary | ICD-10-CM | POA: Diagnosis not present

## 2023-03-31 DIAGNOSIS — D631 Anemia in chronic kidney disease: Secondary | ICD-10-CM | POA: Diagnosis not present

## 2023-03-31 DIAGNOSIS — I5042 Chronic combined systolic (congestive) and diastolic (congestive) heart failure: Secondary | ICD-10-CM | POA: Diagnosis not present

## 2023-03-31 DIAGNOSIS — N1831 Chronic kidney disease, stage 3a: Secondary | ICD-10-CM | POA: Diagnosis not present

## 2023-03-31 DIAGNOSIS — S51001D Unspecified open wound of right elbow, subsequent encounter: Secondary | ICD-10-CM | POA: Diagnosis not present

## 2023-03-31 DIAGNOSIS — M069 Rheumatoid arthritis, unspecified: Secondary | ICD-10-CM | POA: Diagnosis not present

## 2023-03-31 DIAGNOSIS — E46 Unspecified protein-calorie malnutrition: Secondary | ICD-10-CM | POA: Diagnosis not present

## 2023-04-02 DIAGNOSIS — E039 Hypothyroidism, unspecified: Secondary | ICD-10-CM | POA: Diagnosis not present

## 2023-04-02 DIAGNOSIS — I5042 Chronic combined systolic (congestive) and diastolic (congestive) heart failure: Secondary | ICD-10-CM | POA: Diagnosis not present

## 2023-04-02 DIAGNOSIS — S81802D Unspecified open wound, left lower leg, subsequent encounter: Secondary | ICD-10-CM | POA: Diagnosis not present

## 2023-04-02 DIAGNOSIS — E46 Unspecified protein-calorie malnutrition: Secondary | ICD-10-CM | POA: Diagnosis not present

## 2023-04-02 DIAGNOSIS — D631 Anemia in chronic kidney disease: Secondary | ICD-10-CM | POA: Diagnosis not present

## 2023-04-02 DIAGNOSIS — N1831 Chronic kidney disease, stage 3a: Secondary | ICD-10-CM | POA: Diagnosis not present

## 2023-04-02 DIAGNOSIS — I130 Hypertensive heart and chronic kidney disease with heart failure and stage 1 through stage 4 chronic kidney disease, or unspecified chronic kidney disease: Secondary | ICD-10-CM | POA: Diagnosis not present

## 2023-04-02 DIAGNOSIS — S51001D Unspecified open wound of right elbow, subsequent encounter: Secondary | ICD-10-CM | POA: Diagnosis not present

## 2023-04-02 DIAGNOSIS — M069 Rheumatoid arthritis, unspecified: Secondary | ICD-10-CM | POA: Diagnosis not present

## 2023-04-03 DIAGNOSIS — S81802D Unspecified open wound, left lower leg, subsequent encounter: Secondary | ICD-10-CM | POA: Diagnosis not present

## 2023-04-03 DIAGNOSIS — D631 Anemia in chronic kidney disease: Secondary | ICD-10-CM | POA: Diagnosis not present

## 2023-04-03 DIAGNOSIS — E039 Hypothyroidism, unspecified: Secondary | ICD-10-CM | POA: Diagnosis not present

## 2023-04-03 DIAGNOSIS — N1831 Chronic kidney disease, stage 3a: Secondary | ICD-10-CM | POA: Diagnosis not present

## 2023-04-03 DIAGNOSIS — I130 Hypertensive heart and chronic kidney disease with heart failure and stage 1 through stage 4 chronic kidney disease, or unspecified chronic kidney disease: Secondary | ICD-10-CM | POA: Diagnosis not present

## 2023-04-03 DIAGNOSIS — I5042 Chronic combined systolic (congestive) and diastolic (congestive) heart failure: Secondary | ICD-10-CM | POA: Diagnosis not present

## 2023-04-03 DIAGNOSIS — E46 Unspecified protein-calorie malnutrition: Secondary | ICD-10-CM | POA: Diagnosis not present

## 2023-04-03 DIAGNOSIS — S51001D Unspecified open wound of right elbow, subsequent encounter: Secondary | ICD-10-CM | POA: Diagnosis not present

## 2023-04-03 DIAGNOSIS — M069 Rheumatoid arthritis, unspecified: Secondary | ICD-10-CM | POA: Diagnosis not present

## 2023-04-07 ENCOUNTER — Other Ambulatory Visit: Payer: Self-pay

## 2023-04-07 DIAGNOSIS — M069 Rheumatoid arthritis, unspecified: Secondary | ICD-10-CM | POA: Diagnosis not present

## 2023-04-07 DIAGNOSIS — S51001D Unspecified open wound of right elbow, subsequent encounter: Secondary | ICD-10-CM | POA: Diagnosis not present

## 2023-04-07 DIAGNOSIS — E46 Unspecified protein-calorie malnutrition: Secondary | ICD-10-CM | POA: Diagnosis not present

## 2023-04-07 DIAGNOSIS — D631 Anemia in chronic kidney disease: Secondary | ICD-10-CM | POA: Diagnosis not present

## 2023-04-07 DIAGNOSIS — I5042 Chronic combined systolic (congestive) and diastolic (congestive) heart failure: Secondary | ICD-10-CM | POA: Diagnosis not present

## 2023-04-07 DIAGNOSIS — I130 Hypertensive heart and chronic kidney disease with heart failure and stage 1 through stage 4 chronic kidney disease, or unspecified chronic kidney disease: Secondary | ICD-10-CM | POA: Diagnosis not present

## 2023-04-07 DIAGNOSIS — N1831 Chronic kidney disease, stage 3a: Secondary | ICD-10-CM | POA: Diagnosis not present

## 2023-04-07 DIAGNOSIS — S81802D Unspecified open wound, left lower leg, subsequent encounter: Secondary | ICD-10-CM | POA: Diagnosis not present

## 2023-04-07 DIAGNOSIS — E039 Hypothyroidism, unspecified: Secondary | ICD-10-CM | POA: Diagnosis not present

## 2023-04-07 MED ORDER — METOPROLOL SUCCINATE ER 25 MG PO TB24: 25.0000 mg | ORAL_TABLET | Freq: Every day | ORAL | 3 refills | Status: DC

## 2023-04-08 ENCOUNTER — Ambulatory Visit: Payer: Medicare PPO | Attending: Cardiology | Admitting: Cardiology

## 2023-04-08 ENCOUNTER — Encounter: Payer: Self-pay | Admitting: Cardiology

## 2023-04-08 VITALS — HR 66 | Resp 16 | Ht 60.0 in | Wt 131.0 lb

## 2023-04-08 DIAGNOSIS — I442 Atrioventricular block, complete: Secondary | ICD-10-CM | POA: Diagnosis not present

## 2023-04-08 DIAGNOSIS — I82A12 Acute embolism and thrombosis of left axillary vein: Secondary | ICD-10-CM

## 2023-04-08 DIAGNOSIS — I10 Essential (primary) hypertension: Secondary | ICD-10-CM | POA: Diagnosis not present

## 2023-04-08 MED ORDER — APIXABAN 5 MG PO TABS: ORAL_TABLET | ORAL | 0 refills | Status: DC

## 2023-04-08 NOTE — Progress Notes (Signed)
Cardiology Office Note:  .   Date:  04/08/2023  ID:  Pamela Dunn, DOB June 23, 1931, MRN [id number] PCP: Irena Reichmann, DO  [facility] Providers Cardiologist:  Yates Decamp, MD Electrophysiologist:  Nobie Putnam, MD   History of Present Illness: Pamela Dunn Kitchen   Pamela Dunn is a 89 y.o. Caucasian  female  with with long-standing history of recurrent rheumatoid arthritis, left bundle branch block, nonischemic cardiomyopathy with negative nuclear stress test and EF 20% in July 2020, EF improved to 45% by medical therapy, latest echo on 2023 revealed normal LVEF.    PMH significant for hypertension, hyperlipidemia, chronic stage IIIa kidney disease, syncope and complete heart block on 07/11/2020 SP Abbott dual-chamber pacemaker implantation on 07/12/2020, severe DJD and RA with arthritis and her physical disability due to arthritis and using a cane to walk, also has pain in her hand joints.  I had seen her on 01/26/2023 and due to left arm swelling, upper extremity Dopplers which was positive for DVT.,  Was started on Eliquis 5 mg p.o. twice daily.  She now presents for 90-month office visit.  Upper extremity venous duplex 02/04/2023: Right:  No evidence of thrombosis in the subclavian.    Left: Findings consistent with acute deep vein thrombosis involving the left  axillary  vein and left brachial veins. Findings consistent with chronic deep vein thrombosis involving the left subclavian vein.    Discussed the use of AI scribe software for clinical note transcription with the patient, who gave verbal consent to proceed.  History of Present Illness   The patient, with a history of rheumatoid arthritis, presents with left arm swelling and pain.  She has experienced significant improvement in the swelling of her left arm since the last visit, with approximately 95% of the swelling resolved. However, mild swelling and tenderness persist around the elbow area, affecting her ability to use the arm  comfortably.  She is currently taking Eliquis, a blood thinner, twice daily. She ran out of her medication and took her last dose on Sunday, necessitating a refill.  She has a history of rheumatoid arthritis, which contributes to pain in both upper arms and affects her mobility. An injury on her leg is not healing well due to thin skin, a side effect of previous prednisone use. She is not currently walking but is standing and improving daily.     Labs   No results found for: "CHOL", "HDL", "LDLCALC", "LDLDIRECT", "TRIG", "CHOLHDL" Lab Results  Component Value Date   NA 135 03/16/2023   K 4.3 03/16/2023   CO2 24 03/16/2023   GLUCOSE 80 03/16/2023   BUN 24 (H) 03/16/2023   CREATININE 1.16 (H) 03/16/2023   CALCIUM 8.3 (L) 03/16/2023   GFR 51.35 (L) 09/05/2013   GFRNONAA 45 (L) 03/16/2023      Latest Ref Rng & Units 03/16/2023   11:40 PM 01/07/2023    7:38 AM 01/06/2023    3:43 PM  BMP  Glucose 70 - 99 mg/dL 80  74  65   BUN 8 - 23 mg/dL 24  23  22    Creatinine 0.44 - 1.00 mg/dL 6.29  5.28  4.13   Sodium 135 - 145 mmol/L 135  136  139   Potassium 3.5 - 5.1 mmol/L 4.3  4.0  3.9   Chloride 98 - 111 mmol/L 102  102  102   CO2 22 - 32 mmol/L 24  23  26    Calcium 8.9 - 10.3 mg/dL 8.3  8.2  8.4       Latest Ref Rng & Units 03/16/2023   11:40 PM 01/07/2023    7:38 AM 01/06/2023    3:43 PM  CBC  WBC 4.0 - 10.5 K/uL 8.3  15.6  15.5   Hemoglobin 12.0 - 15.0 g/dL 16.1  09.6  04.5   Hematocrit 36.0 - 46.0 % 32.0  33.0  36.7   Platelets 150 - 400 K/uL 315  241  281     Review of Systems  Cardiovascular:  Negative for chest pain, dyspnea on exertion and leg swelling.  Musculoskeletal:  Positive for arthritis and joint pain.    Physical Exam:   VS:  Pulse 66   Resp 16   Ht 5' (1.524 m)   Wt 131 lb (59.4 kg)   SpO2 98%   BMI 25.58 kg/m    Wt Readings from Last 3 Encounters:  04/08/23 131 lb (59.4 kg)  03/17/23 130 lb 15.3 oz (59.4 kg)  03/02/23 131 lb (59.4 kg)    Physical  Exam Neck:     Vascular: No carotid bruit or JVD.  Cardiovascular:     Rate and Rhythm: Normal rate and regular rhythm.     Pulses: Intact distal pulses.     Heart sounds: Normal heart sounds. No murmur heard.    No gallop.  Pulmonary:     Effort: Pulmonary effort is normal.     Breath sounds: Normal breath sounds.  Abdominal:     General: Bowel sounds are normal.     Palpations: Abdomen is soft.  Musculoskeletal:        General: Deformity (burnt out RA in hands) present. No swelling.     Right lower leg: No edema.     Left lower leg: No edema.     Studies Reviewed: .    Upper extremity venous duplex 02/03/2023: Right:  No evidence of thrombosis in the subclavian.    Left: Findings consistent with acute deep vein thrombosis involving the left  axillary vein and left brachial veins. Findings consistent with chronic deep vein thrombosis involving the left subclavian vein.   EKG:     Medications and allergies    Allergies  Allergen Reactions   Coreg [Carvedilol] Diarrhea   Lorazepam Diarrhea     Current Outpatient Medications:    cholecalciferol (VITAMIN D) 1000 UNITS tablet, Take 1,000 Units by mouth daily. , Disp: , Rfl:    Coenzyme Q10 (COQ10) 200 MG CAPS, Take 200 mg by mouth daily., Disp: , Rfl:    diclofenac Sodium (VOLTAREN) 1 % GEL, Apply 2 g topically 4 (four) times daily., Disp: 300 g, Rfl: 0   ezetimibe (ZETIA) 10 MG tablet, Take 10 mg by mouth daily. , Disp: , Rfl:    folic acid (FOLVITE) 1 MG tablet, Take 1 mg by mouth 2 (two) times daily., Disp: , Rfl:    isosorbide-hydrALAZINE (BIDIL) 20-37.5 MG tablet, TAKE 1 TABLET THREE TIMES DAILY, Disp: 270 tablet, Rfl: 3   levothyroxine (SYNTHROID) 25 MCG tablet, Take 25 mcg by mouth daily. , Disp: , Rfl:    metoprolol succinate (TOPROL-XL) 25 MG 24 hr tablet, Take 1 tablet (25 mg total) by mouth daily., Disp: 90 tablet, Rfl: 3   Omega-3 Fatty Acids (OMEGA-3 FISH OIL PO), Take 1 capsule by mouth in the morning and  at bedtime., Disp: , Rfl:    polyethylene glycol (MIRALAX / GLYCOLAX) 17 g packet, Take 17 g by mouth daily., Disp: , Rfl:  predniSONE (DELTASONE) 5 MG tablet, Take 5 mg by mouth daily. Taking 30 mg now that sulfasalazine has been stopped, Disp: , Rfl:    apixaban (ELIQUIS) 5 MG TABS tablet, Take 2 tablets (10mg ) twice daily for 7 days, then 1 tablet (5mg ) twice daily, Disp: 90 tablet, Rfl: 0   ASSESSMENT AND PLAN: .      ICD-10-CM   1. Acute deep vein thrombosis (DVT) of axillary vein of left upper extremity (HCC)  I82.A12 apixaban (ELIQUIS) 5 MG TABS tablet    VAS [place] UPPER EXTREMITY VENOUS DUPLEX    2. Primary hypertension  I10     3. Atrioventricular block, complete (HCC)  I44.2       Assessment and Plan    Deep Vein Thrombosis (DVT) DVT in the left arm with significant improvement and reduced swelling. Mild residual swelling persists. Continued on Eliquis for another three months to ensure complete resolution. Risks of Eliquis include bleeding; benefits include preventing further thrombotic events. If ultrasound is negative, Eliquis will be discontinued. - Continue Eliquis for three months - Order upper extremity venous duplex ultrasound in two months - Refill Eliquis prescription for 90 days, twice a day - Schedule follow-up with PA or NP in three months to review ultrasound results  Rheumatoid Arthritis Rheumatoid arthritis causing pain in both upper arms, contributing to limited mobility and difficulty in checking blood pressure. - Continue current management - Encourage gentle mobility exercises to prevent further loss of function  Hypertension Blood pressure not checked due to left arm pain. Requires monitoring. - Monitor blood pressure at next visit  Heart Block with Pacemaker Pacemaker functioning well. Managed by Dr. Jimmey Ralph. Well-controlled cardiac status. - Continue annual follow-up with Dr. Jimmey Ralph for pacemaker management  General Health Maintenance Advised to  maintain physical activity to prevent deconditioning. Needs to increase walking to avoid losing mobility. - Encourage daily walking and physical activity as tolerated  Follow-up - Follow-up with PA or NP in three months - Follow-up with Dr. Jimmey Ralph annually for pacemaker management.       Signed,  Yates Decamp, MD, [facility] 04/08/2023, 9:40 PM [facility] [address] [place], [place] [address] Phone: [phone]. Fax:  [phone]

## 2023-04-08 NOTE — Patient Instructions (Addendum)
Medication Instructions:  Your physician recommends that you continue on your current medications as directed. Please refer to the Current Medication list given to you today.  *If you need a refill on your cardiac medications before your next appointment, please call your pharmacy*   Lab Work: none If you have labs (blood work) drawn today and your tests are completely normal, you will receive your results only by: MyChart Message (if you have MyChart) OR A paper copy in the mail If you have any lab test that is abnormal or we need to change your treatment, we will call you to review the results.   Testing/Procedures: Your physician has requested that you have a  upper extremity venous duplex in 2 months. This test is an ultrasound of the veins in the l arms. It looks at venous blood flow that carries blood from the heart to the  arms.  Allow thirty minutes for an Upper Venous exam. There are no restrictions or special instructions.  Please note: We ask at that you not bring children with you during ultrasound (echo/ vascular) testing. Due to room size and safety concerns, children are not allowed in the ultrasound rooms during exams. Our front office staff cannot provide observation of children in our lobby area while testing is being conducted. An adult accompanying a patient to their appointment will only be allowed in the ultrasound room at the discretion of the ultrasound technician under special circumstances. We apologize for any inconvenience.    Follow-Up: At [facility], you and your health needs are our priority.  As part of our continuing mission to provide you with exceptional heart care, we have created designated Provider Care Teams.  These Care Teams include your primary Cardiologist (physician) and Advanced Practice Providers (APPs -  Physician Assistants and Nurse Practitioners) who all work together to provide you with the care you need, when you need it.  We  recommend signing up for the patient portal called "MyChart".  Sign up information is provided on this After Visit Summary.  MyChart is used to connect with patients for Virtual Visits (Telemedicine).  Patients are able to view lab/test results, encounter notes, upcoming appointments, etc.  Non-urgent messages can be sent to your provider as well.   To learn more about what you can do with MyChart, go to [email or web address].    Your next appointment:   10 week(s)  Provider:   Jari Favre, PA-C, Ronie Spies, PA-C, Robin Searing, NP, Jacolyn Reedy, PA-C, Eligha Bridegroom, NP, Tereso Newcomer, PA-C, or Perlie Gold, PA-C     Then, Yates Decamp, MD will plan to see you again as needed   Other Instructions

## 2023-04-09 DIAGNOSIS — E039 Hypothyroidism, unspecified: Secondary | ICD-10-CM | POA: Diagnosis not present

## 2023-04-09 DIAGNOSIS — S81802D Unspecified open wound, left lower leg, subsequent encounter: Secondary | ICD-10-CM | POA: Diagnosis not present

## 2023-04-09 DIAGNOSIS — S51001D Unspecified open wound of right elbow, subsequent encounter: Secondary | ICD-10-CM | POA: Diagnosis not present

## 2023-04-09 DIAGNOSIS — I130 Hypertensive heart and chronic kidney disease with heart failure and stage 1 through stage 4 chronic kidney disease, or unspecified chronic kidney disease: Secondary | ICD-10-CM | POA: Diagnosis not present

## 2023-04-09 DIAGNOSIS — I5042 Chronic combined systolic (congestive) and diastolic (congestive) heart failure: Secondary | ICD-10-CM | POA: Diagnosis not present

## 2023-04-09 DIAGNOSIS — D631 Anemia in chronic kidney disease: Secondary | ICD-10-CM | POA: Diagnosis not present

## 2023-04-09 DIAGNOSIS — M069 Rheumatoid arthritis, unspecified: Secondary | ICD-10-CM | POA: Diagnosis not present

## 2023-04-09 DIAGNOSIS — N1831 Chronic kidney disease, stage 3a: Secondary | ICD-10-CM | POA: Diagnosis not present

## 2023-04-09 DIAGNOSIS — E46 Unspecified protein-calorie malnutrition: Secondary | ICD-10-CM | POA: Diagnosis not present

## 2023-04-14 DIAGNOSIS — I130 Hypertensive heart and chronic kidney disease with heart failure and stage 1 through stage 4 chronic kidney disease, or unspecified chronic kidney disease: Secondary | ICD-10-CM | POA: Diagnosis not present

## 2023-04-14 DIAGNOSIS — E46 Unspecified protein-calorie malnutrition: Secondary | ICD-10-CM | POA: Diagnosis not present

## 2023-04-14 DIAGNOSIS — I5042 Chronic combined systolic (congestive) and diastolic (congestive) heart failure: Secondary | ICD-10-CM | POA: Diagnosis not present

## 2023-04-14 DIAGNOSIS — M069 Rheumatoid arthritis, unspecified: Secondary | ICD-10-CM | POA: Diagnosis not present

## 2023-04-14 DIAGNOSIS — N1831 Chronic kidney disease, stage 3a: Secondary | ICD-10-CM | POA: Diagnosis not present

## 2023-04-14 DIAGNOSIS — D631 Anemia in chronic kidney disease: Secondary | ICD-10-CM | POA: Diagnosis not present

## 2023-04-14 DIAGNOSIS — S81802D Unspecified open wound, left lower leg, subsequent encounter: Secondary | ICD-10-CM | POA: Diagnosis not present

## 2023-04-14 DIAGNOSIS — E039 Hypothyroidism, unspecified: Secondary | ICD-10-CM | POA: Diagnosis not present

## 2023-04-14 DIAGNOSIS — S51001D Unspecified open wound of right elbow, subsequent encounter: Secondary | ICD-10-CM | POA: Diagnosis not present

## 2023-04-16 ENCOUNTER — Ambulatory Visit (INDEPENDENT_AMBULATORY_CARE_PROVIDER_SITE_OTHER): Payer: Medicare PPO

## 2023-04-16 DIAGNOSIS — I5042 Chronic combined systolic (congestive) and diastolic (congestive) heart failure: Secondary | ICD-10-CM | POA: Diagnosis not present

## 2023-04-16 DIAGNOSIS — E039 Hypothyroidism, unspecified: Secondary | ICD-10-CM | POA: Diagnosis not present

## 2023-04-16 DIAGNOSIS — M069 Rheumatoid arthritis, unspecified: Secondary | ICD-10-CM | POA: Diagnosis not present

## 2023-04-16 DIAGNOSIS — I442 Atrioventricular block, complete: Secondary | ICD-10-CM

## 2023-04-16 DIAGNOSIS — I130 Hypertensive heart and chronic kidney disease with heart failure and stage 1 through stage 4 chronic kidney disease, or unspecified chronic kidney disease: Secondary | ICD-10-CM | POA: Diagnosis not present

## 2023-04-16 DIAGNOSIS — D631 Anemia in chronic kidney disease: Secondary | ICD-10-CM | POA: Diagnosis not present

## 2023-04-16 DIAGNOSIS — E46 Unspecified protein-calorie malnutrition: Secondary | ICD-10-CM | POA: Diagnosis not present

## 2023-04-16 DIAGNOSIS — N1831 Chronic kidney disease, stage 3a: Secondary | ICD-10-CM | POA: Diagnosis not present

## 2023-04-16 DIAGNOSIS — S51001D Unspecified open wound of right elbow, subsequent encounter: Secondary | ICD-10-CM | POA: Diagnosis not present

## 2023-04-16 DIAGNOSIS — S81802D Unspecified open wound, left lower leg, subsequent encounter: Secondary | ICD-10-CM | POA: Diagnosis not present

## 2023-04-17 LAB — CUP PACEART REMOTE DEVICE CHECK
Battery Remaining Longevity: 85 mo
Battery Remaining Percentage: 77 %
Battery Voltage: 3.01 V
Brady Statistic AP VP Percent: 34 %
Brady Statistic AP VS Percent: 1 %
Brady Statistic AS VP Percent: 65 %
Brady Statistic AS VS Percent: 1 %
Brady Statistic RA Percent Paced: 33 %
Brady Statistic RV Percent Paced: 99 %
Date Time Interrogation Session: 20250207020013
Implantable Lead Connection Status: 753985
Implantable Lead Connection Status: 753985
Implantable Lead Implant Date: 20220505
Implantable Lead Implant Date: 20220505
Implantable Lead Location: 753859
Implantable Lead Location: 753860
Implantable Pulse Generator Implant Date: 20220505
Lead Channel Impedance Value: 350 Ohm
Lead Channel Impedance Value: 540 Ohm
Lead Channel Pacing Threshold Amplitude: 0.5 V
Lead Channel Pacing Threshold Amplitude: 0.75 V
Lead Channel Pacing Threshold Pulse Width: 0.5 ms
Lead Channel Pacing Threshold Pulse Width: 0.5 ms
Lead Channel Sensing Intrinsic Amplitude: 1.5 mV
Lead Channel Sensing Intrinsic Amplitude: 12 mV
Lead Channel Setting Pacing Amplitude: 1.5 V
Lead Channel Setting Pacing Amplitude: 2 V
Lead Channel Setting Pacing Pulse Width: 0.5 ms
Lead Channel Setting Sensing Sensitivity: 2 mV
Pulse Gen Model: 2272
Pulse Gen Serial Number: 3920272

## 2023-04-20 DIAGNOSIS — N1831 Chronic kidney disease, stage 3a: Secondary | ICD-10-CM | POA: Diagnosis not present

## 2023-04-20 DIAGNOSIS — I130 Hypertensive heart and chronic kidney disease with heart failure and stage 1 through stage 4 chronic kidney disease, or unspecified chronic kidney disease: Secondary | ICD-10-CM | POA: Diagnosis not present

## 2023-04-20 DIAGNOSIS — I5042 Chronic combined systolic (congestive) and diastolic (congestive) heart failure: Secondary | ICD-10-CM | POA: Diagnosis not present

## 2023-04-20 DIAGNOSIS — M069 Rheumatoid arthritis, unspecified: Secondary | ICD-10-CM | POA: Diagnosis not present

## 2023-04-20 DIAGNOSIS — D631 Anemia in chronic kidney disease: Secondary | ICD-10-CM | POA: Diagnosis not present

## 2023-04-20 DIAGNOSIS — E039 Hypothyroidism, unspecified: Secondary | ICD-10-CM | POA: Diagnosis not present

## 2023-04-20 DIAGNOSIS — S51001D Unspecified open wound of right elbow, subsequent encounter: Secondary | ICD-10-CM | POA: Diagnosis not present

## 2023-04-20 DIAGNOSIS — E46 Unspecified protein-calorie malnutrition: Secondary | ICD-10-CM | POA: Diagnosis not present

## 2023-04-20 DIAGNOSIS — S81802D Unspecified open wound, left lower leg, subsequent encounter: Secondary | ICD-10-CM | POA: Diagnosis not present

## 2023-04-21 DIAGNOSIS — I130 Hypertensive heart and chronic kidney disease with heart failure and stage 1 through stage 4 chronic kidney disease, or unspecified chronic kidney disease: Secondary | ICD-10-CM | POA: Diagnosis not present

## 2023-04-21 DIAGNOSIS — E039 Hypothyroidism, unspecified: Secondary | ICD-10-CM | POA: Diagnosis not present

## 2023-04-21 DIAGNOSIS — S51001D Unspecified open wound of right elbow, subsequent encounter: Secondary | ICD-10-CM | POA: Diagnosis not present

## 2023-04-21 DIAGNOSIS — E46 Unspecified protein-calorie malnutrition: Secondary | ICD-10-CM | POA: Diagnosis not present

## 2023-04-21 DIAGNOSIS — N1831 Chronic kidney disease, stage 3a: Secondary | ICD-10-CM | POA: Diagnosis not present

## 2023-04-21 DIAGNOSIS — M069 Rheumatoid arthritis, unspecified: Secondary | ICD-10-CM | POA: Diagnosis not present

## 2023-04-21 DIAGNOSIS — D631 Anemia in chronic kidney disease: Secondary | ICD-10-CM | POA: Diagnosis not present

## 2023-04-21 DIAGNOSIS — S81802D Unspecified open wound, left lower leg, subsequent encounter: Secondary | ICD-10-CM | POA: Diagnosis not present

## 2023-04-21 DIAGNOSIS — I5042 Chronic combined systolic (congestive) and diastolic (congestive) heart failure: Secondary | ICD-10-CM | POA: Diagnosis not present

## 2023-04-22 DIAGNOSIS — D631 Anemia in chronic kidney disease: Secondary | ICD-10-CM | POA: Diagnosis not present

## 2023-04-22 DIAGNOSIS — S51001D Unspecified open wound of right elbow, subsequent encounter: Secondary | ICD-10-CM | POA: Diagnosis not present

## 2023-04-22 DIAGNOSIS — I5042 Chronic combined systolic (congestive) and diastolic (congestive) heart failure: Secondary | ICD-10-CM | POA: Diagnosis not present

## 2023-04-22 DIAGNOSIS — S81802D Unspecified open wound, left lower leg, subsequent encounter: Secondary | ICD-10-CM | POA: Diagnosis not present

## 2023-04-22 DIAGNOSIS — M069 Rheumatoid arthritis, unspecified: Secondary | ICD-10-CM | POA: Diagnosis not present

## 2023-04-22 DIAGNOSIS — E46 Unspecified protein-calorie malnutrition: Secondary | ICD-10-CM | POA: Diagnosis not present

## 2023-04-22 DIAGNOSIS — N1831 Chronic kidney disease, stage 3a: Secondary | ICD-10-CM | POA: Diagnosis not present

## 2023-04-22 DIAGNOSIS — E039 Hypothyroidism, unspecified: Secondary | ICD-10-CM | POA: Diagnosis not present

## 2023-04-22 DIAGNOSIS — I130 Hypertensive heart and chronic kidney disease with heart failure and stage 1 through stage 4 chronic kidney disease, or unspecified chronic kidney disease: Secondary | ICD-10-CM | POA: Diagnosis not present

## 2023-04-28 DIAGNOSIS — S51001D Unspecified open wound of right elbow, subsequent encounter: Secondary | ICD-10-CM | POA: Diagnosis not present

## 2023-04-28 DIAGNOSIS — E46 Unspecified protein-calorie malnutrition: Secondary | ICD-10-CM | POA: Diagnosis not present

## 2023-04-28 DIAGNOSIS — S81802D Unspecified open wound, left lower leg, subsequent encounter: Secondary | ICD-10-CM | POA: Diagnosis not present

## 2023-04-28 DIAGNOSIS — E039 Hypothyroidism, unspecified: Secondary | ICD-10-CM | POA: Diagnosis not present

## 2023-04-28 DIAGNOSIS — I130 Hypertensive heart and chronic kidney disease with heart failure and stage 1 through stage 4 chronic kidney disease, or unspecified chronic kidney disease: Secondary | ICD-10-CM | POA: Diagnosis not present

## 2023-04-28 DIAGNOSIS — M069 Rheumatoid arthritis, unspecified: Secondary | ICD-10-CM | POA: Diagnosis not present

## 2023-04-28 DIAGNOSIS — I5042 Chronic combined systolic (congestive) and diastolic (congestive) heart failure: Secondary | ICD-10-CM | POA: Diagnosis not present

## 2023-04-28 DIAGNOSIS — D631 Anemia in chronic kidney disease: Secondary | ICD-10-CM | POA: Diagnosis not present

## 2023-04-28 DIAGNOSIS — N1831 Chronic kidney disease, stage 3a: Secondary | ICD-10-CM | POA: Diagnosis not present

## 2023-04-30 DIAGNOSIS — M069 Rheumatoid arthritis, unspecified: Secondary | ICD-10-CM | POA: Diagnosis not present

## 2023-04-30 DIAGNOSIS — E039 Hypothyroidism, unspecified: Secondary | ICD-10-CM | POA: Diagnosis not present

## 2023-04-30 DIAGNOSIS — S51001D Unspecified open wound of right elbow, subsequent encounter: Secondary | ICD-10-CM | POA: Diagnosis not present

## 2023-04-30 DIAGNOSIS — N1831 Chronic kidney disease, stage 3a: Secondary | ICD-10-CM | POA: Diagnosis not present

## 2023-04-30 DIAGNOSIS — S81802D Unspecified open wound, left lower leg, subsequent encounter: Secondary | ICD-10-CM | POA: Diagnosis not present

## 2023-04-30 DIAGNOSIS — D631 Anemia in chronic kidney disease: Secondary | ICD-10-CM | POA: Diagnosis not present

## 2023-04-30 DIAGNOSIS — I130 Hypertensive heart and chronic kidney disease with heart failure and stage 1 through stage 4 chronic kidney disease, or unspecified chronic kidney disease: Secondary | ICD-10-CM | POA: Diagnosis not present

## 2023-04-30 DIAGNOSIS — E46 Unspecified protein-calorie malnutrition: Secondary | ICD-10-CM | POA: Diagnosis not present

## 2023-04-30 DIAGNOSIS — I5042 Chronic combined systolic (congestive) and diastolic (congestive) heart failure: Secondary | ICD-10-CM | POA: Diagnosis not present

## 2023-05-01 ENCOUNTER — Telehealth: Payer: Self-pay | Admitting: Cardiology

## 2023-05-01 DIAGNOSIS — I5022 Chronic systolic (congestive) heart failure: Secondary | ICD-10-CM

## 2023-05-01 MED ORDER — ISOSORB DINITRATE-HYDRALAZINE 20-37.5 MG PO TABS: 1.0000 | ORAL_TABLET | Freq: Three times a day (TID) | ORAL | 3 refills | Status: DC

## 2023-05-01 NOTE — Telephone Encounter (Signed)
*  STAT* If patient is at the pharmacy, call can be transferred to refill team.   1. Which medications need to be refilled? (please list name of each medication and dose if known)   isosorbide-hydrALAZINE (BIDIL) 20-37.5 MG tablet   2. Would you like to learn more about the convenience, safety, & potential cost savings by using the [facility]?   3. Are you open to using the [facility] (Type [facility]. ).  4. Which pharmacy/location (including street and city if local pharmacy) is medication to be sent to?  [id number] - [place], [place] - [address]   5. Do they need a 30 day or 90 day supply?   90 day  Patient stated she is completely out of this medication.

## 2023-05-05 DIAGNOSIS — D631 Anemia in chronic kidney disease: Secondary | ICD-10-CM | POA: Diagnosis not present

## 2023-05-05 DIAGNOSIS — M069 Rheumatoid arthritis, unspecified: Secondary | ICD-10-CM | POA: Diagnosis not present

## 2023-05-05 DIAGNOSIS — S51001D Unspecified open wound of right elbow, subsequent encounter: Secondary | ICD-10-CM | POA: Diagnosis not present

## 2023-05-05 DIAGNOSIS — S81802D Unspecified open wound, left lower leg, subsequent encounter: Secondary | ICD-10-CM | POA: Diagnosis not present

## 2023-05-05 DIAGNOSIS — I130 Hypertensive heart and chronic kidney disease with heart failure and stage 1 through stage 4 chronic kidney disease, or unspecified chronic kidney disease: Secondary | ICD-10-CM | POA: Diagnosis not present

## 2023-05-05 DIAGNOSIS — N1831 Chronic kidney disease, stage 3a: Secondary | ICD-10-CM | POA: Diagnosis not present

## 2023-05-05 DIAGNOSIS — E039 Hypothyroidism, unspecified: Secondary | ICD-10-CM | POA: Diagnosis not present

## 2023-05-05 DIAGNOSIS — I5042 Chronic combined systolic (congestive) and diastolic (congestive) heart failure: Secondary | ICD-10-CM | POA: Diagnosis not present

## 2023-05-05 DIAGNOSIS — E46 Unspecified protein-calorie malnutrition: Secondary | ICD-10-CM | POA: Diagnosis not present

## 2023-05-06 DIAGNOSIS — D72828 Other elevated white blood cell count: Secondary | ICD-10-CM | POA: Diagnosis not present

## 2023-05-06 DIAGNOSIS — E785 Hyperlipidemia, unspecified: Secondary | ICD-10-CM | POA: Diagnosis not present

## 2023-05-06 DIAGNOSIS — R5383 Other fatigue: Secondary | ICD-10-CM | POA: Diagnosis not present

## 2023-05-06 DIAGNOSIS — E559 Vitamin D deficiency, unspecified: Secondary | ICD-10-CM | POA: Diagnosis not present

## 2023-05-06 DIAGNOSIS — N3000 Acute cystitis without hematuria: Secondary | ICD-10-CM | POA: Diagnosis not present

## 2023-05-06 DIAGNOSIS — E039 Hypothyroidism, unspecified: Secondary | ICD-10-CM | POA: Diagnosis not present

## 2023-05-12 DIAGNOSIS — I5042 Chronic combined systolic (congestive) and diastolic (congestive) heart failure: Secondary | ICD-10-CM | POA: Diagnosis not present

## 2023-05-12 DIAGNOSIS — D631 Anemia in chronic kidney disease: Secondary | ICD-10-CM | POA: Diagnosis not present

## 2023-05-12 DIAGNOSIS — S81802D Unspecified open wound, left lower leg, subsequent encounter: Secondary | ICD-10-CM | POA: Diagnosis not present

## 2023-05-12 DIAGNOSIS — N1831 Chronic kidney disease, stage 3a: Secondary | ICD-10-CM | POA: Diagnosis not present

## 2023-05-12 DIAGNOSIS — M069 Rheumatoid arthritis, unspecified: Secondary | ICD-10-CM | POA: Diagnosis not present

## 2023-05-12 DIAGNOSIS — E46 Unspecified protein-calorie malnutrition: Secondary | ICD-10-CM | POA: Diagnosis not present

## 2023-05-12 DIAGNOSIS — E039 Hypothyroidism, unspecified: Secondary | ICD-10-CM | POA: Diagnosis not present

## 2023-05-12 DIAGNOSIS — I130 Hypertensive heart and chronic kidney disease with heart failure and stage 1 through stage 4 chronic kidney disease, or unspecified chronic kidney disease: Secondary | ICD-10-CM | POA: Diagnosis not present

## 2023-05-12 DIAGNOSIS — S51001D Unspecified open wound of right elbow, subsequent encounter: Secondary | ICD-10-CM | POA: Diagnosis not present

## 2023-05-14 DIAGNOSIS — M109 Gout, unspecified: Secondary | ICD-10-CM | POA: Diagnosis not present

## 2023-05-14 DIAGNOSIS — M810 Age-related osteoporosis without current pathological fracture: Secondary | ICD-10-CM | POA: Diagnosis not present

## 2023-05-14 DIAGNOSIS — M0579 Rheumatoid arthritis with rheumatoid factor of multiple sites without organ or systems involvement: Secondary | ICD-10-CM | POA: Diagnosis not present

## 2023-05-14 DIAGNOSIS — E039 Hypothyroidism, unspecified: Secondary | ICD-10-CM | POA: Diagnosis not present

## 2023-05-14 DIAGNOSIS — R918 Other nonspecific abnormal finding of lung field: Secondary | ICD-10-CM | POA: Diagnosis not present

## 2023-05-14 DIAGNOSIS — E559 Vitamin D deficiency, unspecified: Secondary | ICD-10-CM | POA: Diagnosis not present

## 2023-05-14 DIAGNOSIS — I429 Cardiomyopathy, unspecified: Secondary | ICD-10-CM | POA: Diagnosis not present

## 2023-05-14 DIAGNOSIS — N390 Urinary tract infection, site not specified: Secondary | ICD-10-CM | POA: Diagnosis not present

## 2023-05-14 DIAGNOSIS — Z950 Presence of cardiac pacemaker: Secondary | ICD-10-CM | POA: Diagnosis not present

## 2023-05-19 DIAGNOSIS — D631 Anemia in chronic kidney disease: Secondary | ICD-10-CM | POA: Diagnosis not present

## 2023-05-19 DIAGNOSIS — E46 Unspecified protein-calorie malnutrition: Secondary | ICD-10-CM | POA: Diagnosis not present

## 2023-05-19 DIAGNOSIS — S81802D Unspecified open wound, left lower leg, subsequent encounter: Secondary | ICD-10-CM | POA: Diagnosis not present

## 2023-05-19 DIAGNOSIS — E039 Hypothyroidism, unspecified: Secondary | ICD-10-CM | POA: Diagnosis not present

## 2023-05-19 DIAGNOSIS — I130 Hypertensive heart and chronic kidney disease with heart failure and stage 1 through stage 4 chronic kidney disease, or unspecified chronic kidney disease: Secondary | ICD-10-CM | POA: Diagnosis not present

## 2023-05-19 DIAGNOSIS — N1831 Chronic kidney disease, stage 3a: Secondary | ICD-10-CM | POA: Diagnosis not present

## 2023-05-19 DIAGNOSIS — S51001D Unspecified open wound of right elbow, subsequent encounter: Secondary | ICD-10-CM | POA: Diagnosis not present

## 2023-05-19 DIAGNOSIS — M069 Rheumatoid arthritis, unspecified: Secondary | ICD-10-CM | POA: Diagnosis not present

## 2023-05-19 DIAGNOSIS — I5042 Chronic combined systolic (congestive) and diastolic (congestive) heart failure: Secondary | ICD-10-CM | POA: Diagnosis not present

## 2023-05-21 NOTE — Addendum Note (Signed)
 Addended by: Elease Etienne A on: 05/21/2023 09:24 AM   Modules accepted: Orders

## 2023-05-21 NOTE — Progress Notes (Signed)
 Remote pacemaker transmission.

## 2023-05-23 DIAGNOSIS — E46 Unspecified protein-calorie malnutrition: Secondary | ICD-10-CM | POA: Diagnosis not present

## 2023-05-23 DIAGNOSIS — N1831 Chronic kidney disease, stage 3a: Secondary | ICD-10-CM | POA: Diagnosis not present

## 2023-05-23 DIAGNOSIS — I5042 Chronic combined systolic (congestive) and diastolic (congestive) heart failure: Secondary | ICD-10-CM | POA: Diagnosis not present

## 2023-05-23 DIAGNOSIS — S81802D Unspecified open wound, left lower leg, subsequent encounter: Secondary | ICD-10-CM | POA: Diagnosis not present

## 2023-05-23 DIAGNOSIS — S51001D Unspecified open wound of right elbow, subsequent encounter: Secondary | ICD-10-CM | POA: Diagnosis not present

## 2023-05-23 DIAGNOSIS — I130 Hypertensive heart and chronic kidney disease with heart failure and stage 1 through stage 4 chronic kidney disease, or unspecified chronic kidney disease: Secondary | ICD-10-CM | POA: Diagnosis not present

## 2023-05-23 DIAGNOSIS — M069 Rheumatoid arthritis, unspecified: Secondary | ICD-10-CM | POA: Diagnosis not present

## 2023-05-23 DIAGNOSIS — D631 Anemia in chronic kidney disease: Secondary | ICD-10-CM | POA: Diagnosis not present

## 2023-05-23 DIAGNOSIS — E039 Hypothyroidism, unspecified: Secondary | ICD-10-CM | POA: Diagnosis not present

## 2023-05-25 ENCOUNTER — Other Ambulatory Visit: Payer: Self-pay | Admitting: Cardiology

## 2023-05-25 DIAGNOSIS — M0579 Rheumatoid arthritis with rheumatoid factor of multiple sites without organ or systems involvement: Secondary | ICD-10-CM | POA: Diagnosis not present

## 2023-05-25 DIAGNOSIS — M1990 Unspecified osteoarthritis, unspecified site: Secondary | ICD-10-CM | POA: Diagnosis not present

## 2023-05-25 DIAGNOSIS — Z79899 Other long term (current) drug therapy: Secondary | ICD-10-CM | POA: Diagnosis not present

## 2023-05-25 DIAGNOSIS — M79643 Pain in unspecified hand: Secondary | ICD-10-CM | POA: Diagnosis not present

## 2023-05-25 DIAGNOSIS — I82A12 Acute embolism and thrombosis of left axillary vein: Secondary | ICD-10-CM

## 2023-05-25 DIAGNOSIS — M810 Age-related osteoporosis without current pathological fracture: Secondary | ICD-10-CM | POA: Diagnosis not present

## 2023-05-25 DIAGNOSIS — M109 Gout, unspecified: Secondary | ICD-10-CM | POA: Diagnosis not present

## 2023-05-26 DIAGNOSIS — I5042 Chronic combined systolic (congestive) and diastolic (congestive) heart failure: Secondary | ICD-10-CM | POA: Diagnosis not present

## 2023-05-26 DIAGNOSIS — E46 Unspecified protein-calorie malnutrition: Secondary | ICD-10-CM | POA: Diagnosis not present

## 2023-05-26 DIAGNOSIS — M069 Rheumatoid arthritis, unspecified: Secondary | ICD-10-CM | POA: Diagnosis not present

## 2023-05-26 DIAGNOSIS — I130 Hypertensive heart and chronic kidney disease with heart failure and stage 1 through stage 4 chronic kidney disease, or unspecified chronic kidney disease: Secondary | ICD-10-CM | POA: Diagnosis not present

## 2023-05-26 DIAGNOSIS — D631 Anemia in chronic kidney disease: Secondary | ICD-10-CM | POA: Diagnosis not present

## 2023-05-26 DIAGNOSIS — E039 Hypothyroidism, unspecified: Secondary | ICD-10-CM | POA: Diagnosis not present

## 2023-05-26 DIAGNOSIS — S51001D Unspecified open wound of right elbow, subsequent encounter: Secondary | ICD-10-CM | POA: Diagnosis not present

## 2023-05-26 DIAGNOSIS — N1831 Chronic kidney disease, stage 3a: Secondary | ICD-10-CM | POA: Diagnosis not present

## 2023-05-26 DIAGNOSIS — S81802D Unspecified open wound, left lower leg, subsequent encounter: Secondary | ICD-10-CM | POA: Diagnosis not present

## 2023-05-26 NOTE — Telephone Encounter (Signed)
 Prescription refill request for Eliquis received. Indication:dvt Last office visit:1/25 Scr:1.16  1/25 Age: 89 Weight:59.4  kg  Prescription refilled

## 2023-05-28 DIAGNOSIS — E039 Hypothyroidism, unspecified: Secondary | ICD-10-CM | POA: Diagnosis not present

## 2023-05-28 DIAGNOSIS — E46 Unspecified protein-calorie malnutrition: Secondary | ICD-10-CM | POA: Diagnosis not present

## 2023-05-28 DIAGNOSIS — S51001D Unspecified open wound of right elbow, subsequent encounter: Secondary | ICD-10-CM | POA: Diagnosis not present

## 2023-05-28 DIAGNOSIS — I5042 Chronic combined systolic (congestive) and diastolic (congestive) heart failure: Secondary | ICD-10-CM | POA: Diagnosis not present

## 2023-05-28 DIAGNOSIS — D631 Anemia in chronic kidney disease: Secondary | ICD-10-CM | POA: Diagnosis not present

## 2023-05-28 DIAGNOSIS — I130 Hypertensive heart and chronic kidney disease with heart failure and stage 1 through stage 4 chronic kidney disease, or unspecified chronic kidney disease: Secondary | ICD-10-CM | POA: Diagnosis not present

## 2023-05-28 DIAGNOSIS — S81802D Unspecified open wound, left lower leg, subsequent encounter: Secondary | ICD-10-CM | POA: Diagnosis not present

## 2023-05-28 DIAGNOSIS — M069 Rheumatoid arthritis, unspecified: Secondary | ICD-10-CM | POA: Diagnosis not present

## 2023-05-28 DIAGNOSIS — N1831 Chronic kidney disease, stage 3a: Secondary | ICD-10-CM | POA: Diagnosis not present

## 2023-05-30 DIAGNOSIS — I5042 Chronic combined systolic (congestive) and diastolic (congestive) heart failure: Secondary | ICD-10-CM | POA: Diagnosis not present

## 2023-05-30 DIAGNOSIS — D631 Anemia in chronic kidney disease: Secondary | ICD-10-CM | POA: Diagnosis not present

## 2023-05-30 DIAGNOSIS — N1831 Chronic kidney disease, stage 3a: Secondary | ICD-10-CM | POA: Diagnosis not present

## 2023-05-30 DIAGNOSIS — E039 Hypothyroidism, unspecified: Secondary | ICD-10-CM | POA: Diagnosis not present

## 2023-05-30 DIAGNOSIS — I130 Hypertensive heart and chronic kidney disease with heart failure and stage 1 through stage 4 chronic kidney disease, or unspecified chronic kidney disease: Secondary | ICD-10-CM | POA: Diagnosis not present

## 2023-05-30 DIAGNOSIS — M069 Rheumatoid arthritis, unspecified: Secondary | ICD-10-CM | POA: Diagnosis not present

## 2023-05-30 DIAGNOSIS — E46 Unspecified protein-calorie malnutrition: Secondary | ICD-10-CM | POA: Diagnosis not present

## 2023-05-30 DIAGNOSIS — S51001D Unspecified open wound of right elbow, subsequent encounter: Secondary | ICD-10-CM | POA: Diagnosis not present

## 2023-05-30 DIAGNOSIS — S81802D Unspecified open wound, left lower leg, subsequent encounter: Secondary | ICD-10-CM | POA: Diagnosis not present

## 2023-06-01 DIAGNOSIS — E46 Unspecified protein-calorie malnutrition: Secondary | ICD-10-CM | POA: Diagnosis not present

## 2023-06-01 DIAGNOSIS — S51001D Unspecified open wound of right elbow, subsequent encounter: Secondary | ICD-10-CM | POA: Diagnosis not present

## 2023-06-01 DIAGNOSIS — S81802D Unspecified open wound, left lower leg, subsequent encounter: Secondary | ICD-10-CM | POA: Diagnosis not present

## 2023-06-01 DIAGNOSIS — M069 Rheumatoid arthritis, unspecified: Secondary | ICD-10-CM | POA: Diagnosis not present

## 2023-06-02 DIAGNOSIS — S81802D Unspecified open wound, left lower leg, subsequent encounter: Secondary | ICD-10-CM | POA: Diagnosis not present

## 2023-06-02 DIAGNOSIS — I5042 Chronic combined systolic (congestive) and diastolic (congestive) heart failure: Secondary | ICD-10-CM | POA: Diagnosis not present

## 2023-06-02 DIAGNOSIS — E039 Hypothyroidism, unspecified: Secondary | ICD-10-CM | POA: Diagnosis not present

## 2023-06-02 DIAGNOSIS — D631 Anemia in chronic kidney disease: Secondary | ICD-10-CM | POA: Diagnosis not present

## 2023-06-02 DIAGNOSIS — M069 Rheumatoid arthritis, unspecified: Secondary | ICD-10-CM | POA: Diagnosis not present

## 2023-06-02 DIAGNOSIS — N1831 Chronic kidney disease, stage 3a: Secondary | ICD-10-CM | POA: Diagnosis not present

## 2023-06-02 DIAGNOSIS — E46 Unspecified protein-calorie malnutrition: Secondary | ICD-10-CM | POA: Diagnosis not present

## 2023-06-02 DIAGNOSIS — S51001D Unspecified open wound of right elbow, subsequent encounter: Secondary | ICD-10-CM | POA: Diagnosis not present

## 2023-06-02 DIAGNOSIS — I130 Hypertensive heart and chronic kidney disease with heart failure and stage 1 through stage 4 chronic kidney disease, or unspecified chronic kidney disease: Secondary | ICD-10-CM | POA: Diagnosis not present

## 2023-06-04 DIAGNOSIS — E46 Unspecified protein-calorie malnutrition: Secondary | ICD-10-CM | POA: Diagnosis not present

## 2023-06-04 DIAGNOSIS — S81802D Unspecified open wound, left lower leg, subsequent encounter: Secondary | ICD-10-CM | POA: Diagnosis not present

## 2023-06-04 DIAGNOSIS — D631 Anemia in chronic kidney disease: Secondary | ICD-10-CM | POA: Diagnosis not present

## 2023-06-04 DIAGNOSIS — E039 Hypothyroidism, unspecified: Secondary | ICD-10-CM | POA: Diagnosis not present

## 2023-06-04 DIAGNOSIS — I5042 Chronic combined systolic (congestive) and diastolic (congestive) heart failure: Secondary | ICD-10-CM | POA: Diagnosis not present

## 2023-06-04 DIAGNOSIS — M069 Rheumatoid arthritis, unspecified: Secondary | ICD-10-CM | POA: Diagnosis not present

## 2023-06-04 DIAGNOSIS — N1831 Chronic kidney disease, stage 3a: Secondary | ICD-10-CM | POA: Diagnosis not present

## 2023-06-04 DIAGNOSIS — S51001D Unspecified open wound of right elbow, subsequent encounter: Secondary | ICD-10-CM | POA: Diagnosis not present

## 2023-06-04 DIAGNOSIS — I130 Hypertensive heart and chronic kidney disease with heart failure and stage 1 through stage 4 chronic kidney disease, or unspecified chronic kidney disease: Secondary | ICD-10-CM | POA: Diagnosis not present

## 2023-06-05 ENCOUNTER — Ambulatory Visit (HOSPITAL_COMMUNITY)
Admission: RE | Admit: 2023-06-05 | Discharge: 2023-06-05 | Disposition: A | Discharge status: Home / Self Care | Payer: Medicare PPO | Source: Ambulatory Visit | Attending: Cardiology | Admitting: Cardiology

## 2023-06-05 DIAGNOSIS — I82A12 Acute embolism and thrombosis of left axillary vein: Secondary | ICD-10-CM

## 2023-06-06 NOTE — Progress Notes (Signed)
 Can discontinue Eliquis. No DVT noted

## 2023-06-08 DIAGNOSIS — N3020 Other chronic cystitis without hematuria: Secondary | ICD-10-CM | POA: Diagnosis not present

## 2023-06-09 ENCOUNTER — Other Ambulatory Visit: Payer: Self-pay | Admitting: *Deleted

## 2023-06-09 DIAGNOSIS — I130 Hypertensive heart and chronic kidney disease with heart failure and stage 1 through stage 4 chronic kidney disease, or unspecified chronic kidney disease: Secondary | ICD-10-CM | POA: Diagnosis not present

## 2023-06-09 DIAGNOSIS — D631 Anemia in chronic kidney disease: Secondary | ICD-10-CM | POA: Diagnosis not present

## 2023-06-09 DIAGNOSIS — E46 Unspecified protein-calorie malnutrition: Secondary | ICD-10-CM | POA: Diagnosis not present

## 2023-06-09 DIAGNOSIS — N1831 Chronic kidney disease, stage 3a: Secondary | ICD-10-CM | POA: Diagnosis not present

## 2023-06-09 DIAGNOSIS — S81802D Unspecified open wound, left lower leg, subsequent encounter: Secondary | ICD-10-CM | POA: Diagnosis not present

## 2023-06-09 DIAGNOSIS — M069 Rheumatoid arthritis, unspecified: Secondary | ICD-10-CM | POA: Diagnosis not present

## 2023-06-09 DIAGNOSIS — S51001D Unspecified open wound of right elbow, subsequent encounter: Secondary | ICD-10-CM | POA: Diagnosis not present

## 2023-06-09 DIAGNOSIS — I5042 Chronic combined systolic (congestive) and diastolic (congestive) heart failure: Secondary | ICD-10-CM | POA: Diagnosis not present

## 2023-06-09 DIAGNOSIS — E039 Hypothyroidism, unspecified: Secondary | ICD-10-CM | POA: Diagnosis not present

## 2023-06-09 NOTE — Progress Notes (Signed)
 Yates Decamp, MD 06/06/2023 11:00 AM EDT     Can discontinue Eliquis. No DVT noted

## 2023-06-16 DIAGNOSIS — N1831 Chronic kidney disease, stage 3a: Secondary | ICD-10-CM | POA: Diagnosis not present

## 2023-06-16 DIAGNOSIS — E46 Unspecified protein-calorie malnutrition: Secondary | ICD-10-CM | POA: Diagnosis not present

## 2023-06-16 DIAGNOSIS — S51001D Unspecified open wound of right elbow, subsequent encounter: Secondary | ICD-10-CM | POA: Diagnosis not present

## 2023-06-16 DIAGNOSIS — M069 Rheumatoid arthritis, unspecified: Secondary | ICD-10-CM | POA: Diagnosis not present

## 2023-06-16 DIAGNOSIS — D631 Anemia in chronic kidney disease: Secondary | ICD-10-CM | POA: Diagnosis not present

## 2023-06-16 DIAGNOSIS — S81802D Unspecified open wound, left lower leg, subsequent encounter: Secondary | ICD-10-CM | POA: Diagnosis not present

## 2023-06-16 DIAGNOSIS — I5042 Chronic combined systolic (congestive) and diastolic (congestive) heart failure: Secondary | ICD-10-CM | POA: Diagnosis not present

## 2023-06-16 DIAGNOSIS — E039 Hypothyroidism, unspecified: Secondary | ICD-10-CM | POA: Diagnosis not present

## 2023-06-16 DIAGNOSIS — I130 Hypertensive heart and chronic kidney disease with heart failure and stage 1 through stage 4 chronic kidney disease, or unspecified chronic kidney disease: Secondary | ICD-10-CM | POA: Diagnosis not present

## 2023-06-16 NOTE — Progress Notes (Unsigned)
 Cardiology Office Note    Patient Name: Pamela Dunn Date of Encounter: 06/16/2023  Primary Care Provider:  Irena Reichmann, DO Primary Cardiologist:  Yates Decamp, MD Primary Electrophysiologist: Nobie Putnam, MD   Past Medical History    Past Medical History:  Diagnosis Date   Allergy    seasonal   Anemia    Arthritis    Encounter for care of pacemaker 07/12/2020   GERD (gastroesophageal reflux disease)    Heart block AV complete (HCC) 07/11/2020   Hyperlipidemia    Hypertension    Pacemaker Abbott Assurity MRI model [id number] 07/12/2020 07/12/2020    History of Present Illness  Pamela Dunn is a 89 y.o. female with a PMH of HTN, chronic combined CHF HLD, CHB s/p dual-chamber PPM 07/2020, CKD stage IIIa, and ICM, LBBB, rheumatoid arthritis who presents today for 10-week follow-up.  [name] was seen initially by Dr. Jacinto Halim in 05/2018 for abnormal EKG.  She was found to have new LBBB and underwent 2D echo showing EF of 20% with moderate concentric LVH and diffuse hypokinesis and moderate to severe MR.  She underwent Lexiscan Myoview that showed apical ischemia with global hypokinesis and septal defect suggestive of ischemia.  She was admitted on 08/16/2019 with small bowel obstruction and went resection.  She was seen on 07/2020 with recurrent syncope via EMS and found to have complete heart block.  She underwent PPM placement on 07/12/2020 with Abbott Assurity device.  She did well following her procedure with no further syncopal complaints.  She was seen 01/26/2023 with left arm swelling and underwent upper extremity Doppler which revealed DVT and patient was started on Eliquis 5 mg.  She was previously followed by Dr. Johney Frame and is currently followed by Dr. Jimmey Ralph for management of PPM.  She was last seen by Dr. Jimmey Ralph on 03/02/2023 and noted her biggest concern being rheumatoid joint pain.  She was last seen by Dr. Jacinto Halim on 04/08/2023 for 77-month follow-up of upper extremity DVT.  She noted mild  residual swelling with Eliquis discontinued following upper extremity ultrasound that revealed no residual DVT.  [name] presents today for 2-week follow-up. She continues to experience some residual swelling in the arm, which she manages by elevating it on a pillow. She has a cough that occurs primarily at night when lying down, producing clear sputum. No significant swelling is noted except in her feet, which she attributes to rheumatoid arthritis. She has not been on diuretics before, and her fluid intake is adequate. Her cholesterol levels were checked in February, showing an LDL of 128, which is above the target. She has a history of taking statins but is currently on ezetimibe. Her cholesterol was previously well-controlled. She is currently on ezetimibe and Bidil, and has no issues with her medications. Her blood pressure is monitored weekly by a visiting nurse and therapist, and she reports stable readings. She engages in physical therapy at home, focusing on walking with a walker and leg exercises, and performs arm and hand movements to promote circulation. She does not weigh herself daily and has not noticed any significant swelling beyond her feet. Patient denies chest pain, palpitations, dyspnea, PND, orthopnea, nausea, vomiting, dizziness, syncope, edema, weight gain, or early satiety.  Discussed the use of AI scribe software for clinical note transcription with the patient, who gave verbal consent to proceed.  History of Present Illness   Review of Systems  Please see the history of present illness.    All other systems  reviewed and are otherwise negative except as noted above.  Physical Exam    Wt Readings from Last 3 Encounters:  04/08/23 131 lb (59.4 kg)  03/17/23 130 lb 15.3 oz (59.4 kg)  03/02/23 131 lb (59.4 kg)   AV:[id number] were no vitals filed for this visit.,There is no height or weight on file to calculate BMI. GEN: Well nourished, well developed in no acute  distress Neck: No JVD; No carotid bruits Pulmonary: Clear to auscultation without rales, wheezing or rhonchi  Cardiovascular: Normal rate. Regular rhythm. Normal S1. Normal S2.   Murmurs: There is no murmur.  ABDOMEN: Soft, non-tender, non-distended EXTREMITIES:  No edema; No deformity   EKG/LABS/ Recent Cardiac Studies   ECG personally reviewed by me today -none completed today  Risk Assessment/Calculations:     Lab Results  Component Value Date   WBC 8.3 03/16/2023   HGB 10.8 (L) 03/16/2023   HCT 32.0 (L) 03/16/2023   MCV 94.4 03/16/2023   PLT 315 03/16/2023   Lab Results  Component Value Date   CREATININE 1.16 (H) 03/16/2023   BUN 24 (H) 03/16/2023   NA 135 03/16/2023   K 4.3 03/16/2023   CL 102 03/16/2023   CO2 24 03/16/2023   No results found for: "CHOL", "HDL", "LDLCALC", "LDLDIRECT", "TRIG", "CHOLHDL"  No results found for: "HGBA1C" Assessment & Plan    1.  HFrEF/NICM: -2D echo completed 2022 showing EF of 45% and mildly decreased LV function with grade 1 DD -Fluid in the lungs noted on auscultation possibly due to fluid retention.  -We will check BMET today - Prescribe Lasix 20 mg as needed for fluid retention. -Continue current GDMT with Toprol-XL 25 mg, BiDil 20-37.5 mg -Low sodium diet, fluid restriction <2L, and daily weights encouraged. Educated to contact our office for weight gain of 2 lbs overnight or 5 lbs in one week.    2.  History of CHB: -s/p PPM placement on 07/12/2020 with Abbott Assurity device due to complete heart block -Normal Paceart report and histograms  3.  Essential hypertension: -Patient's blood pressure was unable to be documented or checked during today's visit but noted to be stable at 120/80 at physical therapy today -Continue BiDil 20-37.5 mg twice daily and Toprol-XL 25 mg daily  4.  History of DVT: -Recently discontinued Eliquis 5 mg twice daily Blood clot in the left upper arm resolved as per recent Doppler ultrasound.  Swelling decreased but still present. - Encourage elevation of the arm to reduce swelling.  5.  Hyperlipidemia: LDL at 128 mg/dL, above target of less than 70 mg/dL. Currently on ezetimibe with a history of statin intolerance. - Discuss alternative cholesterol-lowering medications with primary care provider. - Consider referral to a clinical pharmacist for medication management.  Disposition: Follow-up with Yates Decamp, MD or APP in 6 months   Signed, Napoleon Form, Leodis Rains, NP 06/16/2023, 7:16 PM [facility]

## 2023-06-17 ENCOUNTER — Ambulatory Visit: Payer: Medicare PPO | Attending: Nurse Practitioner | Admitting: Nurse Practitioner

## 2023-06-17 ENCOUNTER — Encounter: Payer: Self-pay | Admitting: Nurse Practitioner

## 2023-06-17 VITALS — HR 60 | Ht 60.0 in

## 2023-06-17 DIAGNOSIS — I5022 Chronic systolic (congestive) heart failure: Secondary | ICD-10-CM | POA: Diagnosis not present

## 2023-06-17 DIAGNOSIS — I442 Atrioventricular block, complete: Secondary | ICD-10-CM | POA: Diagnosis not present

## 2023-06-17 DIAGNOSIS — I428 Other cardiomyopathies: Secondary | ICD-10-CM | POA: Diagnosis not present

## 2023-06-17 DIAGNOSIS — I10 Essential (primary) hypertension: Secondary | ICD-10-CM

## 2023-06-17 DIAGNOSIS — E785 Hyperlipidemia, unspecified: Secondary | ICD-10-CM | POA: Diagnosis not present

## 2023-06-17 MED ORDER — FUROSEMIDE 20 MG PO TABS: 20.0000 mg | ORAL_TABLET | Freq: Every day | ORAL | 0 refills | Status: DC | PRN

## 2023-06-17 NOTE — Patient Instructions (Addendum)
 Medication Instructions:  START Lasix 20mg  Take as needed *If you need a refill on your cardiac medications before your next appointment, please call your pharmacy*  Lab Work: TODAY-BMET If you have labs (blood work) drawn today and your tests are completely normal, you will receive your results only by: MyChart Message (if you have MyChart) OR A paper copy in the mail If you have any lab test that is abnormal or we need to change your treatment, we will call you to review the results.  Testing/Procedures: NONE ORDERED  Follow-Up: At [facility], you and your health needs are our priority.  As part of our continuing mission to provide you with exceptional heart care, our providers are all part of one team.  This team includes your primary Cardiologist (physician) and Advanced Practice Providers or APPs (Physician Assistants and Nurse Practitioners) who all work together to provide you with the care you need, when you need it.  Your next appointment:   6 month(s)  Provider:   Yates Decamp, MD     We recommend signing up for the patient portal called "MyChart".  Sign up information is provided on this After Visit Summary.  MyChart is used to connect with patients for Virtual Visits (Telemedicine).  Patients are able to view lab/test results, encounter notes, upcoming appointments, etc.  Non-urgent messages can be sent to your provider as well.   To learn more about what you can do with MyChart, go to [email or web address].   Other Instructions       1st Floor: - Lobby - Registration  - Pharmacy  - Lab - Cafe  2nd Floor: - PV Lab - Diagnostic Testing (echo, CT, nuclear med)  3rd Floor: - Vacant  4th Floor: - TCTS (cardiothoracic surgery) - AFib Clinic - Structural Heart Clinic - Vascular Surgery  - Vascular Ultrasound  5th Floor: - HeartCare Cardiology (general and EP) - Clinical Pharmacy for coumadin, hypertension, lipid, weight-loss medications, and med  management appointments    Valet parking services will be available as well.

## 2023-06-19 DIAGNOSIS — I428 Other cardiomyopathies: Secondary | ICD-10-CM | POA: Diagnosis not present

## 2023-06-19 DIAGNOSIS — E785 Hyperlipidemia, unspecified: Secondary | ICD-10-CM | POA: Diagnosis not present

## 2023-06-19 DIAGNOSIS — I442 Atrioventricular block, complete: Secondary | ICD-10-CM | POA: Diagnosis not present

## 2023-06-19 DIAGNOSIS — I10 Essential (primary) hypertension: Secondary | ICD-10-CM | POA: Diagnosis not present

## 2023-06-19 DIAGNOSIS — I5022 Chronic systolic (congestive) heart failure: Secondary | ICD-10-CM | POA: Diagnosis not present

## 2023-06-20 LAB — BASIC METABOLIC PANEL WITH GFR
BUN/Creatinine Ratio: 22 (ref 12–28)
BUN: 18 mg/dL (ref 8–27)
CO2: 23 mmol/L (ref 20–29)
Calcium: 8.9 mg/dL (ref 8.7–10.3)
Chloride: 94 mmol/L — ABNORMAL LOW (ref 96–106)
Creatinine, Ser: 0.82 mg/dL (ref 0.57–1.00)
Glucose: 91 mg/dL (ref 70–99)
Potassium: 5 mmol/L (ref 3.5–5.2)
Sodium: 130 mmol/L — ABNORMAL LOW (ref 134–144)
eGFR: 68 mL/min/{1.73_m2} (ref >59)

## 2023-06-22 DIAGNOSIS — I130 Hypertensive heart and chronic kidney disease with heart failure and stage 1 through stage 4 chronic kidney disease, or unspecified chronic kidney disease: Secondary | ICD-10-CM | POA: Diagnosis not present

## 2023-06-22 DIAGNOSIS — D631 Anemia in chronic kidney disease: Secondary | ICD-10-CM | POA: Diagnosis not present

## 2023-06-22 DIAGNOSIS — E039 Hypothyroidism, unspecified: Secondary | ICD-10-CM | POA: Diagnosis not present

## 2023-06-22 DIAGNOSIS — N1831 Chronic kidney disease, stage 3a: Secondary | ICD-10-CM | POA: Diagnosis not present

## 2023-06-22 DIAGNOSIS — S51001D Unspecified open wound of right elbow, subsequent encounter: Secondary | ICD-10-CM | POA: Diagnosis not present

## 2023-06-22 DIAGNOSIS — M069 Rheumatoid arthritis, unspecified: Secondary | ICD-10-CM | POA: Diagnosis not present

## 2023-06-22 DIAGNOSIS — I5042 Chronic combined systolic (congestive) and diastolic (congestive) heart failure: Secondary | ICD-10-CM | POA: Diagnosis not present

## 2023-06-22 DIAGNOSIS — E46 Unspecified protein-calorie malnutrition: Secondary | ICD-10-CM | POA: Diagnosis not present

## 2023-06-22 DIAGNOSIS — S81802D Unspecified open wound, left lower leg, subsequent encounter: Secondary | ICD-10-CM | POA: Diagnosis not present

## 2023-06-23 DIAGNOSIS — E46 Unspecified protein-calorie malnutrition: Secondary | ICD-10-CM | POA: Diagnosis not present

## 2023-06-23 DIAGNOSIS — N1831 Chronic kidney disease, stage 3a: Secondary | ICD-10-CM | POA: Diagnosis not present

## 2023-06-23 DIAGNOSIS — I5042 Chronic combined systolic (congestive) and diastolic (congestive) heart failure: Secondary | ICD-10-CM | POA: Diagnosis not present

## 2023-06-23 DIAGNOSIS — S81802D Unspecified open wound, left lower leg, subsequent encounter: Secondary | ICD-10-CM | POA: Diagnosis not present

## 2023-06-23 DIAGNOSIS — D631 Anemia in chronic kidney disease: Secondary | ICD-10-CM | POA: Diagnosis not present

## 2023-06-23 DIAGNOSIS — I130 Hypertensive heart and chronic kidney disease with heart failure and stage 1 through stage 4 chronic kidney disease, or unspecified chronic kidney disease: Secondary | ICD-10-CM | POA: Diagnosis not present

## 2023-06-23 DIAGNOSIS — S51001D Unspecified open wound of right elbow, subsequent encounter: Secondary | ICD-10-CM | POA: Diagnosis not present

## 2023-06-23 DIAGNOSIS — M069 Rheumatoid arthritis, unspecified: Secondary | ICD-10-CM | POA: Diagnosis not present

## 2023-06-23 DIAGNOSIS — E039 Hypothyroidism, unspecified: Secondary | ICD-10-CM | POA: Diagnosis not present

## 2023-06-29 DIAGNOSIS — D631 Anemia in chronic kidney disease: Secondary | ICD-10-CM | POA: Diagnosis not present

## 2023-06-29 DIAGNOSIS — I5042 Chronic combined systolic (congestive) and diastolic (congestive) heart failure: Secondary | ICD-10-CM | POA: Diagnosis not present

## 2023-06-29 DIAGNOSIS — I130 Hypertensive heart and chronic kidney disease with heart failure and stage 1 through stage 4 chronic kidney disease, or unspecified chronic kidney disease: Secondary | ICD-10-CM | POA: Diagnosis not present

## 2023-06-29 DIAGNOSIS — E46 Unspecified protein-calorie malnutrition: Secondary | ICD-10-CM | POA: Diagnosis not present

## 2023-06-29 DIAGNOSIS — E039 Hypothyroidism, unspecified: Secondary | ICD-10-CM | POA: Diagnosis not present

## 2023-06-29 DIAGNOSIS — N1831 Chronic kidney disease, stage 3a: Secondary | ICD-10-CM | POA: Diagnosis not present

## 2023-06-29 DIAGNOSIS — S51001D Unspecified open wound of right elbow, subsequent encounter: Secondary | ICD-10-CM | POA: Diagnosis not present

## 2023-06-29 DIAGNOSIS — M069 Rheumatoid arthritis, unspecified: Secondary | ICD-10-CM | POA: Diagnosis not present

## 2023-06-29 DIAGNOSIS — S81802D Unspecified open wound, left lower leg, subsequent encounter: Secondary | ICD-10-CM | POA: Diagnosis not present

## 2023-06-30 DIAGNOSIS — E039 Hypothyroidism, unspecified: Secondary | ICD-10-CM | POA: Diagnosis not present

## 2023-06-30 DIAGNOSIS — S81802D Unspecified open wound, left lower leg, subsequent encounter: Secondary | ICD-10-CM | POA: Diagnosis not present

## 2023-06-30 DIAGNOSIS — N1831 Chronic kidney disease, stage 3a: Secondary | ICD-10-CM | POA: Diagnosis not present

## 2023-06-30 DIAGNOSIS — E46 Unspecified protein-calorie malnutrition: Secondary | ICD-10-CM | POA: Diagnosis not present

## 2023-06-30 DIAGNOSIS — S51001D Unspecified open wound of right elbow, subsequent encounter: Secondary | ICD-10-CM | POA: Diagnosis not present

## 2023-06-30 DIAGNOSIS — I5042 Chronic combined systolic (congestive) and diastolic (congestive) heart failure: Secondary | ICD-10-CM | POA: Diagnosis not present

## 2023-06-30 DIAGNOSIS — D631 Anemia in chronic kidney disease: Secondary | ICD-10-CM | POA: Diagnosis not present

## 2023-06-30 DIAGNOSIS — M069 Rheumatoid arthritis, unspecified: Secondary | ICD-10-CM | POA: Diagnosis not present

## 2023-06-30 DIAGNOSIS — I130 Hypertensive heart and chronic kidney disease with heart failure and stage 1 through stage 4 chronic kidney disease, or unspecified chronic kidney disease: Secondary | ICD-10-CM | POA: Diagnosis not present

## 2023-07-07 DIAGNOSIS — I5042 Chronic combined systolic (congestive) and diastolic (congestive) heart failure: Secondary | ICD-10-CM | POA: Diagnosis not present

## 2023-07-07 DIAGNOSIS — S51001D Unspecified open wound of right elbow, subsequent encounter: Secondary | ICD-10-CM | POA: Diagnosis not present

## 2023-07-07 DIAGNOSIS — D631 Anemia in chronic kidney disease: Secondary | ICD-10-CM | POA: Diagnosis not present

## 2023-07-07 DIAGNOSIS — E46 Unspecified protein-calorie malnutrition: Secondary | ICD-10-CM | POA: Diagnosis not present

## 2023-07-07 DIAGNOSIS — E039 Hypothyroidism, unspecified: Secondary | ICD-10-CM | POA: Diagnosis not present

## 2023-07-07 DIAGNOSIS — N1831 Chronic kidney disease, stage 3a: Secondary | ICD-10-CM | POA: Diagnosis not present

## 2023-07-07 DIAGNOSIS — I130 Hypertensive heart and chronic kidney disease with heart failure and stage 1 through stage 4 chronic kidney disease, or unspecified chronic kidney disease: Secondary | ICD-10-CM | POA: Diagnosis not present

## 2023-07-07 DIAGNOSIS — M069 Rheumatoid arthritis, unspecified: Secondary | ICD-10-CM | POA: Diagnosis not present

## 2023-07-07 DIAGNOSIS — S81802D Unspecified open wound, left lower leg, subsequent encounter: Secondary | ICD-10-CM | POA: Diagnosis not present

## 2023-07-11 ENCOUNTER — Other Ambulatory Visit: Payer: Self-pay | Admitting: Nurse Practitioner

## 2023-07-11 DIAGNOSIS — I5042 Chronic combined systolic (congestive) and diastolic (congestive) heart failure: Secondary | ICD-10-CM | POA: Diagnosis not present

## 2023-07-11 DIAGNOSIS — E039 Hypothyroidism, unspecified: Secondary | ICD-10-CM | POA: Diagnosis not present

## 2023-07-11 DIAGNOSIS — S81802D Unspecified open wound, left lower leg, subsequent encounter: Secondary | ICD-10-CM | POA: Diagnosis not present

## 2023-07-11 DIAGNOSIS — E46 Unspecified protein-calorie malnutrition: Secondary | ICD-10-CM | POA: Diagnosis not present

## 2023-07-11 DIAGNOSIS — M069 Rheumatoid arthritis, unspecified: Secondary | ICD-10-CM | POA: Diagnosis not present

## 2023-07-11 DIAGNOSIS — N1831 Chronic kidney disease, stage 3a: Secondary | ICD-10-CM | POA: Diagnosis not present

## 2023-07-11 DIAGNOSIS — I130 Hypertensive heart and chronic kidney disease with heart failure and stage 1 through stage 4 chronic kidney disease, or unspecified chronic kidney disease: Secondary | ICD-10-CM | POA: Diagnosis not present

## 2023-07-11 DIAGNOSIS — S51001D Unspecified open wound of right elbow, subsequent encounter: Secondary | ICD-10-CM | POA: Diagnosis not present

## 2023-07-11 DIAGNOSIS — D631 Anemia in chronic kidney disease: Secondary | ICD-10-CM | POA: Diagnosis not present

## 2023-07-14 DIAGNOSIS — N1831 Chronic kidney disease, stage 3a: Secondary | ICD-10-CM | POA: Diagnosis not present

## 2023-07-14 DIAGNOSIS — S81802D Unspecified open wound, left lower leg, subsequent encounter: Secondary | ICD-10-CM | POA: Diagnosis not present

## 2023-07-14 DIAGNOSIS — I130 Hypertensive heart and chronic kidney disease with heart failure and stage 1 through stage 4 chronic kidney disease, or unspecified chronic kidney disease: Secondary | ICD-10-CM | POA: Diagnosis not present

## 2023-07-14 DIAGNOSIS — E039 Hypothyroidism, unspecified: Secondary | ICD-10-CM | POA: Diagnosis not present

## 2023-07-14 DIAGNOSIS — M069 Rheumatoid arthritis, unspecified: Secondary | ICD-10-CM | POA: Diagnosis not present

## 2023-07-14 DIAGNOSIS — E46 Unspecified protein-calorie malnutrition: Secondary | ICD-10-CM | POA: Diagnosis not present

## 2023-07-14 DIAGNOSIS — D631 Anemia in chronic kidney disease: Secondary | ICD-10-CM | POA: Diagnosis not present

## 2023-07-14 DIAGNOSIS — I5042 Chronic combined systolic (congestive) and diastolic (congestive) heart failure: Secondary | ICD-10-CM | POA: Diagnosis not present

## 2023-07-14 DIAGNOSIS — S51001D Unspecified open wound of right elbow, subsequent encounter: Secondary | ICD-10-CM | POA: Diagnosis not present

## 2023-07-16 ENCOUNTER — Ambulatory Visit (INDEPENDENT_AMBULATORY_CARE_PROVIDER_SITE_OTHER): Payer: Medicare PPO

## 2023-07-16 DIAGNOSIS — D631 Anemia in chronic kidney disease: Secondary | ICD-10-CM | POA: Diagnosis not present

## 2023-07-16 DIAGNOSIS — E46 Unspecified protein-calorie malnutrition: Secondary | ICD-10-CM | POA: Diagnosis not present

## 2023-07-16 DIAGNOSIS — I428 Other cardiomyopathies: Secondary | ICD-10-CM

## 2023-07-16 DIAGNOSIS — E039 Hypothyroidism, unspecified: Secondary | ICD-10-CM | POA: Diagnosis not present

## 2023-07-16 DIAGNOSIS — M069 Rheumatoid arthritis, unspecified: Secondary | ICD-10-CM | POA: Diagnosis not present

## 2023-07-16 DIAGNOSIS — N1831 Chronic kidney disease, stage 3a: Secondary | ICD-10-CM | POA: Diagnosis not present

## 2023-07-16 DIAGNOSIS — S81802D Unspecified open wound, left lower leg, subsequent encounter: Secondary | ICD-10-CM | POA: Diagnosis not present

## 2023-07-16 DIAGNOSIS — I5042 Chronic combined systolic (congestive) and diastolic (congestive) heart failure: Secondary | ICD-10-CM | POA: Diagnosis not present

## 2023-07-16 DIAGNOSIS — S51001D Unspecified open wound of right elbow, subsequent encounter: Secondary | ICD-10-CM | POA: Diagnosis not present

## 2023-07-16 DIAGNOSIS — I130 Hypertensive heart and chronic kidney disease with heart failure and stage 1 through stage 4 chronic kidney disease, or unspecified chronic kidney disease: Secondary | ICD-10-CM | POA: Diagnosis not present

## 2023-07-16 LAB — CUP PACEART REMOTE DEVICE CHECK
Battery Remaining Longevity: 82 mo
Battery Remaining Percentage: 74 %
Battery Voltage: 3.01 V
Brady Statistic AP VP Percent: 47 %
Brady Statistic AP VS Percent: 1 %
Brady Statistic AS VP Percent: 52 %
Brady Statistic AS VS Percent: 1 %
Brady Statistic RA Percent Paced: 46 %
Brady Statistic RV Percent Paced: 99 %
Date Time Interrogation Session: 20250508020015
Implantable Lead Connection Status: 753985
Implantable Lead Connection Status: 753985
Implantable Lead Implant Date: 20220505
Implantable Lead Implant Date: 20220505
Implantable Lead Location: 753859
Implantable Lead Location: 753860
Implantable Pulse Generator Implant Date: 20220505
Lead Channel Impedance Value: 350 Ohm
Lead Channel Impedance Value: 560 Ohm
Lead Channel Pacing Threshold Amplitude: 0.5 V
Lead Channel Pacing Threshold Amplitude: 0.75 V
Lead Channel Pacing Threshold Pulse Width: 0.5 ms
Lead Channel Pacing Threshold Pulse Width: 0.5 ms
Lead Channel Sensing Intrinsic Amplitude: 1.7 mV
Lead Channel Sensing Intrinsic Amplitude: 12 mV
Lead Channel Setting Pacing Amplitude: 1.5 V
Lead Channel Setting Pacing Amplitude: 2 V
Lead Channel Setting Pacing Pulse Width: 0.5 ms
Lead Channel Setting Sensing Sensitivity: 2 mV
Pulse Gen Model: 2272
Pulse Gen Serial Number: 3920272

## 2023-07-21 DIAGNOSIS — I5042 Chronic combined systolic (congestive) and diastolic (congestive) heart failure: Secondary | ICD-10-CM | POA: Diagnosis not present

## 2023-07-21 DIAGNOSIS — E039 Hypothyroidism, unspecified: Secondary | ICD-10-CM | POA: Diagnosis not present

## 2023-07-21 DIAGNOSIS — E46 Unspecified protein-calorie malnutrition: Secondary | ICD-10-CM | POA: Diagnosis not present

## 2023-07-21 DIAGNOSIS — N1831 Chronic kidney disease, stage 3a: Secondary | ICD-10-CM | POA: Diagnosis not present

## 2023-07-21 DIAGNOSIS — S81802D Unspecified open wound, left lower leg, subsequent encounter: Secondary | ICD-10-CM | POA: Diagnosis not present

## 2023-07-21 DIAGNOSIS — I130 Hypertensive heart and chronic kidney disease with heart failure and stage 1 through stage 4 chronic kidney disease, or unspecified chronic kidney disease: Secondary | ICD-10-CM | POA: Diagnosis not present

## 2023-07-21 DIAGNOSIS — M069 Rheumatoid arthritis, unspecified: Secondary | ICD-10-CM | POA: Diagnosis not present

## 2023-07-21 DIAGNOSIS — D631 Anemia in chronic kidney disease: Secondary | ICD-10-CM | POA: Diagnosis not present

## 2023-07-21 DIAGNOSIS — S51001D Unspecified open wound of right elbow, subsequent encounter: Secondary | ICD-10-CM | POA: Diagnosis not present

## 2023-07-23 DIAGNOSIS — E46 Unspecified protein-calorie malnutrition: Secondary | ICD-10-CM | POA: Diagnosis not present

## 2023-07-23 DIAGNOSIS — S81802D Unspecified open wound, left lower leg, subsequent encounter: Secondary | ICD-10-CM | POA: Diagnosis not present

## 2023-07-23 DIAGNOSIS — D631 Anemia in chronic kidney disease: Secondary | ICD-10-CM | POA: Diagnosis not present

## 2023-07-23 DIAGNOSIS — M069 Rheumatoid arthritis, unspecified: Secondary | ICD-10-CM | POA: Diagnosis not present

## 2023-07-23 DIAGNOSIS — S51001D Unspecified open wound of right elbow, subsequent encounter: Secondary | ICD-10-CM | POA: Diagnosis not present

## 2023-07-23 DIAGNOSIS — N1831 Chronic kidney disease, stage 3a: Secondary | ICD-10-CM | POA: Diagnosis not present

## 2023-07-23 DIAGNOSIS — E039 Hypothyroidism, unspecified: Secondary | ICD-10-CM | POA: Diagnosis not present

## 2023-07-23 DIAGNOSIS — I5042 Chronic combined systolic (congestive) and diastolic (congestive) heart failure: Secondary | ICD-10-CM | POA: Diagnosis not present

## 2023-07-23 DIAGNOSIS — I130 Hypertensive heart and chronic kidney disease with heart failure and stage 1 through stage 4 chronic kidney disease, or unspecified chronic kidney disease: Secondary | ICD-10-CM | POA: Diagnosis not present

## 2023-07-27 DIAGNOSIS — I130 Hypertensive heart and chronic kidney disease with heart failure and stage 1 through stage 4 chronic kidney disease, or unspecified chronic kidney disease: Secondary | ICD-10-CM | POA: Diagnosis not present

## 2023-07-27 DIAGNOSIS — D631 Anemia in chronic kidney disease: Secondary | ICD-10-CM | POA: Diagnosis not present

## 2023-07-27 DIAGNOSIS — E46 Unspecified protein-calorie malnutrition: Secondary | ICD-10-CM | POA: Diagnosis not present

## 2023-07-27 DIAGNOSIS — I5042 Chronic combined systolic (congestive) and diastolic (congestive) heart failure: Secondary | ICD-10-CM | POA: Diagnosis not present

## 2023-07-27 DIAGNOSIS — N1831 Chronic kidney disease, stage 3a: Secondary | ICD-10-CM | POA: Diagnosis not present

## 2023-07-27 DIAGNOSIS — S81802D Unspecified open wound, left lower leg, subsequent encounter: Secondary | ICD-10-CM | POA: Diagnosis not present

## 2023-07-27 DIAGNOSIS — M069 Rheumatoid arthritis, unspecified: Secondary | ICD-10-CM | POA: Diagnosis not present

## 2023-07-27 DIAGNOSIS — S51001D Unspecified open wound of right elbow, subsequent encounter: Secondary | ICD-10-CM | POA: Diagnosis not present

## 2023-07-27 DIAGNOSIS — E039 Hypothyroidism, unspecified: Secondary | ICD-10-CM | POA: Diagnosis not present

## 2023-07-29 DIAGNOSIS — I5042 Chronic combined systolic (congestive) and diastolic (congestive) heart failure: Secondary | ICD-10-CM | POA: Diagnosis not present

## 2023-07-29 DIAGNOSIS — D631 Anemia in chronic kidney disease: Secondary | ICD-10-CM | POA: Diagnosis not present

## 2023-07-29 DIAGNOSIS — N1831 Chronic kidney disease, stage 3a: Secondary | ICD-10-CM | POA: Diagnosis not present

## 2023-07-29 DIAGNOSIS — M069 Rheumatoid arthritis, unspecified: Secondary | ICD-10-CM | POA: Diagnosis not present

## 2023-07-29 DIAGNOSIS — S81802D Unspecified open wound, left lower leg, subsequent encounter: Secondary | ICD-10-CM | POA: Diagnosis not present

## 2023-07-29 DIAGNOSIS — S51001D Unspecified open wound of right elbow, subsequent encounter: Secondary | ICD-10-CM | POA: Diagnosis not present

## 2023-07-29 DIAGNOSIS — I130 Hypertensive heart and chronic kidney disease with heart failure and stage 1 through stage 4 chronic kidney disease, or unspecified chronic kidney disease: Secondary | ICD-10-CM | POA: Diagnosis not present

## 2023-07-29 DIAGNOSIS — E039 Hypothyroidism, unspecified: Secondary | ICD-10-CM | POA: Diagnosis not present

## 2023-07-29 DIAGNOSIS — E46 Unspecified protein-calorie malnutrition: Secondary | ICD-10-CM | POA: Diagnosis not present

## 2023-08-04 DIAGNOSIS — M069 Rheumatoid arthritis, unspecified: Secondary | ICD-10-CM | POA: Diagnosis not present

## 2023-08-04 DIAGNOSIS — N1831 Chronic kidney disease, stage 3a: Secondary | ICD-10-CM | POA: Diagnosis not present

## 2023-08-04 DIAGNOSIS — S81802D Unspecified open wound, left lower leg, subsequent encounter: Secondary | ICD-10-CM | POA: Diagnosis not present

## 2023-08-04 DIAGNOSIS — E46 Unspecified protein-calorie malnutrition: Secondary | ICD-10-CM | POA: Diagnosis not present

## 2023-08-04 DIAGNOSIS — D631 Anemia in chronic kidney disease: Secondary | ICD-10-CM | POA: Diagnosis not present

## 2023-08-04 DIAGNOSIS — E039 Hypothyroidism, unspecified: Secondary | ICD-10-CM | POA: Diagnosis not present

## 2023-08-04 DIAGNOSIS — S51001D Unspecified open wound of right elbow, subsequent encounter: Secondary | ICD-10-CM | POA: Diagnosis not present

## 2023-08-04 DIAGNOSIS — I130 Hypertensive heart and chronic kidney disease with heart failure and stage 1 through stage 4 chronic kidney disease, or unspecified chronic kidney disease: Secondary | ICD-10-CM | POA: Diagnosis not present

## 2023-08-04 DIAGNOSIS — I5042 Chronic combined systolic (congestive) and diastolic (congestive) heart failure: Secondary | ICD-10-CM | POA: Diagnosis not present

## 2023-08-06 DIAGNOSIS — I130 Hypertensive heart and chronic kidney disease with heart failure and stage 1 through stage 4 chronic kidney disease, or unspecified chronic kidney disease: Secondary | ICD-10-CM | POA: Diagnosis not present

## 2023-08-06 DIAGNOSIS — N1831 Chronic kidney disease, stage 3a: Secondary | ICD-10-CM | POA: Diagnosis not present

## 2023-08-06 DIAGNOSIS — E46 Unspecified protein-calorie malnutrition: Secondary | ICD-10-CM | POA: Diagnosis not present

## 2023-08-06 DIAGNOSIS — D631 Anemia in chronic kidney disease: Secondary | ICD-10-CM | POA: Diagnosis not present

## 2023-08-06 DIAGNOSIS — E039 Hypothyroidism, unspecified: Secondary | ICD-10-CM | POA: Diagnosis not present

## 2023-08-06 DIAGNOSIS — M069 Rheumatoid arthritis, unspecified: Secondary | ICD-10-CM | POA: Diagnosis not present

## 2023-08-06 DIAGNOSIS — I5042 Chronic combined systolic (congestive) and diastolic (congestive) heart failure: Secondary | ICD-10-CM | POA: Diagnosis not present

## 2023-08-06 DIAGNOSIS — S51001D Unspecified open wound of right elbow, subsequent encounter: Secondary | ICD-10-CM | POA: Diagnosis not present

## 2023-08-06 DIAGNOSIS — S81802D Unspecified open wound, left lower leg, subsequent encounter: Secondary | ICD-10-CM | POA: Diagnosis not present

## 2023-08-11 DIAGNOSIS — S81802D Unspecified open wound, left lower leg, subsequent encounter: Secondary | ICD-10-CM | POA: Diagnosis not present

## 2023-08-11 DIAGNOSIS — I5042 Chronic combined systolic (congestive) and diastolic (congestive) heart failure: Secondary | ICD-10-CM | POA: Diagnosis not present

## 2023-08-11 DIAGNOSIS — E039 Hypothyroidism, unspecified: Secondary | ICD-10-CM | POA: Diagnosis not present

## 2023-08-11 DIAGNOSIS — M069 Rheumatoid arthritis, unspecified: Secondary | ICD-10-CM | POA: Diagnosis not present

## 2023-08-11 DIAGNOSIS — N1831 Chronic kidney disease, stage 3a: Secondary | ICD-10-CM | POA: Diagnosis not present

## 2023-08-11 DIAGNOSIS — D631 Anemia in chronic kidney disease: Secondary | ICD-10-CM | POA: Diagnosis not present

## 2023-08-11 DIAGNOSIS — S51001D Unspecified open wound of right elbow, subsequent encounter: Secondary | ICD-10-CM | POA: Diagnosis not present

## 2023-08-11 DIAGNOSIS — I130 Hypertensive heart and chronic kidney disease with heart failure and stage 1 through stage 4 chronic kidney disease, or unspecified chronic kidney disease: Secondary | ICD-10-CM | POA: Diagnosis not present

## 2023-08-11 DIAGNOSIS — E46 Unspecified protein-calorie malnutrition: Secondary | ICD-10-CM | POA: Diagnosis not present

## 2023-08-18 DIAGNOSIS — E46 Unspecified protein-calorie malnutrition: Secondary | ICD-10-CM | POA: Diagnosis not present

## 2023-08-18 DIAGNOSIS — S81802D Unspecified open wound, left lower leg, subsequent encounter: Secondary | ICD-10-CM | POA: Diagnosis not present

## 2023-08-18 DIAGNOSIS — S51001D Unspecified open wound of right elbow, subsequent encounter: Secondary | ICD-10-CM | POA: Diagnosis not present

## 2023-08-18 DIAGNOSIS — M069 Rheumatoid arthritis, unspecified: Secondary | ICD-10-CM | POA: Diagnosis not present

## 2023-08-18 DIAGNOSIS — E039 Hypothyroidism, unspecified: Secondary | ICD-10-CM | POA: Diagnosis not present

## 2023-08-18 DIAGNOSIS — D631 Anemia in chronic kidney disease: Secondary | ICD-10-CM | POA: Diagnosis not present

## 2023-08-18 DIAGNOSIS — N1831 Chronic kidney disease, stage 3a: Secondary | ICD-10-CM | POA: Diagnosis not present

## 2023-08-18 DIAGNOSIS — I130 Hypertensive heart and chronic kidney disease with heart failure and stage 1 through stage 4 chronic kidney disease, or unspecified chronic kidney disease: Secondary | ICD-10-CM | POA: Diagnosis not present

## 2023-08-18 DIAGNOSIS — I5042 Chronic combined systolic (congestive) and diastolic (congestive) heart failure: Secondary | ICD-10-CM | POA: Diagnosis not present

## 2023-08-19 DIAGNOSIS — E039 Hypothyroidism, unspecified: Secondary | ICD-10-CM | POA: Diagnosis not present

## 2023-08-19 DIAGNOSIS — D631 Anemia in chronic kidney disease: Secondary | ICD-10-CM | POA: Diagnosis not present

## 2023-08-19 DIAGNOSIS — N1831 Chronic kidney disease, stage 3a: Secondary | ICD-10-CM | POA: Diagnosis not present

## 2023-08-19 DIAGNOSIS — M069 Rheumatoid arthritis, unspecified: Secondary | ICD-10-CM | POA: Diagnosis not present

## 2023-08-19 DIAGNOSIS — S81802D Unspecified open wound, left lower leg, subsequent encounter: Secondary | ICD-10-CM | POA: Diagnosis not present

## 2023-08-19 DIAGNOSIS — E46 Unspecified protein-calorie malnutrition: Secondary | ICD-10-CM | POA: Diagnosis not present

## 2023-08-19 DIAGNOSIS — I5042 Chronic combined systolic (congestive) and diastolic (congestive) heart failure: Secondary | ICD-10-CM | POA: Diagnosis not present

## 2023-08-19 DIAGNOSIS — I130 Hypertensive heart and chronic kidney disease with heart failure and stage 1 through stage 4 chronic kidney disease, or unspecified chronic kidney disease: Secondary | ICD-10-CM | POA: Diagnosis not present

## 2023-08-19 DIAGNOSIS — S51001D Unspecified open wound of right elbow, subsequent encounter: Secondary | ICD-10-CM | POA: Diagnosis not present

## 2023-08-20 DIAGNOSIS — H04222 Epiphora due to insufficient drainage, left lacrimal gland: Secondary | ICD-10-CM | POA: Diagnosis not present

## 2023-08-20 DIAGNOSIS — H04552 Acquired stenosis of left nasolacrimal duct: Secondary | ICD-10-CM | POA: Diagnosis not present

## 2023-08-21 NOTE — Progress Notes (Signed)
 Remote pacemaker transmission.

## 2023-08-24 DIAGNOSIS — D631 Anemia in chronic kidney disease: Secondary | ICD-10-CM | POA: Diagnosis not present

## 2023-08-24 DIAGNOSIS — E46 Unspecified protein-calorie malnutrition: Secondary | ICD-10-CM | POA: Diagnosis not present

## 2023-08-24 DIAGNOSIS — N1831 Chronic kidney disease, stage 3a: Secondary | ICD-10-CM | POA: Diagnosis not present

## 2023-08-24 DIAGNOSIS — E039 Hypothyroidism, unspecified: Secondary | ICD-10-CM | POA: Diagnosis not present

## 2023-08-24 DIAGNOSIS — S81802D Unspecified open wound, left lower leg, subsequent encounter: Secondary | ICD-10-CM | POA: Diagnosis not present

## 2023-08-24 DIAGNOSIS — M069 Rheumatoid arthritis, unspecified: Secondary | ICD-10-CM | POA: Diagnosis not present

## 2023-08-24 DIAGNOSIS — I130 Hypertensive heart and chronic kidney disease with heart failure and stage 1 through stage 4 chronic kidney disease, or unspecified chronic kidney disease: Secondary | ICD-10-CM | POA: Diagnosis not present

## 2023-08-24 DIAGNOSIS — S51001D Unspecified open wound of right elbow, subsequent encounter: Secondary | ICD-10-CM | POA: Diagnosis not present

## 2023-08-24 DIAGNOSIS — I5042 Chronic combined systolic (congestive) and diastolic (congestive) heart failure: Secondary | ICD-10-CM | POA: Diagnosis not present

## 2023-08-25 DIAGNOSIS — S81802D Unspecified open wound, left lower leg, subsequent encounter: Secondary | ICD-10-CM | POA: Diagnosis not present

## 2023-08-25 DIAGNOSIS — I5042 Chronic combined systolic (congestive) and diastolic (congestive) heart failure: Secondary | ICD-10-CM | POA: Diagnosis not present

## 2023-08-25 DIAGNOSIS — M069 Rheumatoid arthritis, unspecified: Secondary | ICD-10-CM | POA: Diagnosis not present

## 2023-08-25 DIAGNOSIS — I130 Hypertensive heart and chronic kidney disease with heart failure and stage 1 through stage 4 chronic kidney disease, or unspecified chronic kidney disease: Secondary | ICD-10-CM | POA: Diagnosis not present

## 2023-08-25 DIAGNOSIS — E46 Unspecified protein-calorie malnutrition: Secondary | ICD-10-CM | POA: Diagnosis not present

## 2023-08-25 DIAGNOSIS — E039 Hypothyroidism, unspecified: Secondary | ICD-10-CM | POA: Diagnosis not present

## 2023-08-25 DIAGNOSIS — S51001D Unspecified open wound of right elbow, subsequent encounter: Secondary | ICD-10-CM | POA: Diagnosis not present

## 2023-08-25 DIAGNOSIS — N1831 Chronic kidney disease, stage 3a: Secondary | ICD-10-CM | POA: Diagnosis not present

## 2023-08-25 DIAGNOSIS — D631 Anemia in chronic kidney disease: Secondary | ICD-10-CM | POA: Diagnosis not present

## 2023-08-28 DIAGNOSIS — E46 Unspecified protein-calorie malnutrition: Secondary | ICD-10-CM | POA: Diagnosis not present

## 2023-08-28 DIAGNOSIS — S81802D Unspecified open wound, left lower leg, subsequent encounter: Secondary | ICD-10-CM | POA: Diagnosis not present

## 2023-08-28 DIAGNOSIS — S51001D Unspecified open wound of right elbow, subsequent encounter: Secondary | ICD-10-CM | POA: Diagnosis not present

## 2023-08-28 DIAGNOSIS — N1831 Chronic kidney disease, stage 3a: Secondary | ICD-10-CM | POA: Diagnosis not present

## 2023-08-28 DIAGNOSIS — D631 Anemia in chronic kidney disease: Secondary | ICD-10-CM | POA: Diagnosis not present

## 2023-08-28 DIAGNOSIS — M069 Rheumatoid arthritis, unspecified: Secondary | ICD-10-CM | POA: Diagnosis not present

## 2023-08-28 DIAGNOSIS — I130 Hypertensive heart and chronic kidney disease with heart failure and stage 1 through stage 4 chronic kidney disease, or unspecified chronic kidney disease: Secondary | ICD-10-CM | POA: Diagnosis not present

## 2023-08-28 DIAGNOSIS — E039 Hypothyroidism, unspecified: Secondary | ICD-10-CM | POA: Diagnosis not present

## 2023-08-28 DIAGNOSIS — I5042 Chronic combined systolic (congestive) and diastolic (congestive) heart failure: Secondary | ICD-10-CM | POA: Diagnosis not present

## 2023-09-01 DIAGNOSIS — I130 Hypertensive heart and chronic kidney disease with heart failure and stage 1 through stage 4 chronic kidney disease, or unspecified chronic kidney disease: Secondary | ICD-10-CM | POA: Diagnosis not present

## 2023-09-01 DIAGNOSIS — M069 Rheumatoid arthritis, unspecified: Secondary | ICD-10-CM | POA: Diagnosis not present

## 2023-09-01 DIAGNOSIS — S81802D Unspecified open wound, left lower leg, subsequent encounter: Secondary | ICD-10-CM | POA: Diagnosis not present

## 2023-09-01 DIAGNOSIS — N1831 Chronic kidney disease, stage 3a: Secondary | ICD-10-CM | POA: Diagnosis not present

## 2023-09-01 DIAGNOSIS — E46 Unspecified protein-calorie malnutrition: Secondary | ICD-10-CM | POA: Diagnosis not present

## 2023-09-01 DIAGNOSIS — S51001D Unspecified open wound of right elbow, subsequent encounter: Secondary | ICD-10-CM | POA: Diagnosis not present

## 2023-09-01 DIAGNOSIS — D631 Anemia in chronic kidney disease: Secondary | ICD-10-CM | POA: Diagnosis not present

## 2023-09-01 DIAGNOSIS — I5042 Chronic combined systolic (congestive) and diastolic (congestive) heart failure: Secondary | ICD-10-CM | POA: Diagnosis not present

## 2023-09-01 DIAGNOSIS — E039 Hypothyroidism, unspecified: Secondary | ICD-10-CM | POA: Diagnosis not present

## 2023-09-04 ENCOUNTER — Other Ambulatory Visit: Payer: Self-pay

## 2023-09-04 DIAGNOSIS — R918 Other nonspecific abnormal finding of lung field: Secondary | ICD-10-CM

## 2023-09-07 DIAGNOSIS — N3020 Other chronic cystitis without hematuria: Secondary | ICD-10-CM | POA: Diagnosis not present

## 2023-09-08 DIAGNOSIS — M069 Rheumatoid arthritis, unspecified: Secondary | ICD-10-CM | POA: Diagnosis not present

## 2023-09-08 DIAGNOSIS — Z87440 Personal history of urinary (tract) infections: Secondary | ICD-10-CM | POA: Diagnosis not present

## 2023-09-08 DIAGNOSIS — E039 Hypothyroidism, unspecified: Secondary | ICD-10-CM | POA: Diagnosis not present

## 2023-09-08 DIAGNOSIS — I700 Atherosclerosis of aorta: Secondary | ICD-10-CM | POA: Diagnosis not present

## 2023-09-08 DIAGNOSIS — N1831 Chronic kidney disease, stage 3a: Secondary | ICD-10-CM | POA: Diagnosis not present

## 2023-09-08 DIAGNOSIS — M0579 Rheumatoid arthritis with rheumatoid factor of multiple sites without organ or systems involvement: Secondary | ICD-10-CM | POA: Diagnosis not present

## 2023-09-08 DIAGNOSIS — I10 Essential (primary) hypertension: Secondary | ICD-10-CM | POA: Diagnosis not present

## 2023-09-08 DIAGNOSIS — E46 Unspecified protein-calorie malnutrition: Secondary | ICD-10-CM | POA: Diagnosis not present

## 2023-09-08 DIAGNOSIS — I5042 Chronic combined systolic (congestive) and diastolic (congestive) heart failure: Secondary | ICD-10-CM | POA: Diagnosis not present

## 2023-09-08 DIAGNOSIS — S51001D Unspecified open wound of right elbow, subsequent encounter: Secondary | ICD-10-CM | POA: Diagnosis not present

## 2023-09-08 DIAGNOSIS — N390 Urinary tract infection, site not specified: Secondary | ICD-10-CM | POA: Diagnosis not present

## 2023-09-08 DIAGNOSIS — I429 Cardiomyopathy, unspecified: Secondary | ICD-10-CM | POA: Diagnosis not present

## 2023-09-08 DIAGNOSIS — I130 Hypertensive heart and chronic kidney disease with heart failure and stage 1 through stage 4 chronic kidney disease, or unspecified chronic kidney disease: Secondary | ICD-10-CM | POA: Diagnosis not present

## 2023-09-08 DIAGNOSIS — D631 Anemia in chronic kidney disease: Secondary | ICD-10-CM | POA: Diagnosis not present

## 2023-09-08 DIAGNOSIS — E559 Vitamin D deficiency, unspecified: Secondary | ICD-10-CM | POA: Diagnosis not present

## 2023-09-08 DIAGNOSIS — S81802D Unspecified open wound, left lower leg, subsequent encounter: Secondary | ICD-10-CM | POA: Diagnosis not present

## 2023-09-09 DIAGNOSIS — M1990 Unspecified osteoarthritis, unspecified site: Secondary | ICD-10-CM | POA: Diagnosis not present

## 2023-09-09 DIAGNOSIS — M109 Gout, unspecified: Secondary | ICD-10-CM | POA: Diagnosis not present

## 2023-09-09 DIAGNOSIS — M0579 Rheumatoid arthritis with rheumatoid factor of multiple sites without organ or systems involvement: Secondary | ICD-10-CM | POA: Diagnosis not present

## 2023-09-09 DIAGNOSIS — M25473 Effusion, unspecified ankle: Secondary | ICD-10-CM | POA: Diagnosis not present

## 2023-09-09 DIAGNOSIS — M810 Age-related osteoporosis without current pathological fracture: Secondary | ICD-10-CM | POA: Diagnosis not present

## 2023-09-09 DIAGNOSIS — M25579 Pain in unspecified ankle and joints of unspecified foot: Secondary | ICD-10-CM | POA: Diagnosis not present

## 2023-09-09 DIAGNOSIS — Z79899 Other long term (current) drug therapy: Secondary | ICD-10-CM | POA: Diagnosis not present

## 2023-09-09 DIAGNOSIS — M79643 Pain in unspecified hand: Secondary | ICD-10-CM | POA: Diagnosis not present

## 2023-09-10 DIAGNOSIS — H04552 Acquired stenosis of left nasolacrimal duct: Secondary | ICD-10-CM | POA: Diagnosis not present

## 2023-09-14 ENCOUNTER — Ambulatory Visit
Admission: RE | Admit: 2023-09-14 | Discharge: 2023-09-14 | Disposition: A | Discharge status: Home / Self Care | Source: Ambulatory Visit

## 2023-09-14 DIAGNOSIS — R918 Other nonspecific abnormal finding of lung field: Secondary | ICD-10-CM

## 2023-09-15 DIAGNOSIS — I10 Essential (primary) hypertension: Secondary | ICD-10-CM | POA: Diagnosis not present

## 2023-09-15 DIAGNOSIS — M109 Gout, unspecified: Secondary | ICD-10-CM | POA: Diagnosis not present

## 2023-09-15 DIAGNOSIS — I429 Cardiomyopathy, unspecified: Secondary | ICD-10-CM | POA: Diagnosis not present

## 2023-09-15 DIAGNOSIS — N1831 Chronic kidney disease, stage 3a: Secondary | ICD-10-CM | POA: Diagnosis not present

## 2023-09-15 DIAGNOSIS — M0579 Rheumatoid arthritis with rheumatoid factor of multiple sites without organ or systems involvement: Secondary | ICD-10-CM | POA: Diagnosis not present

## 2023-09-15 DIAGNOSIS — E039 Hypothyroidism, unspecified: Secondary | ICD-10-CM | POA: Diagnosis not present

## 2023-09-15 DIAGNOSIS — I5042 Chronic combined systolic (congestive) and diastolic (congestive) heart failure: Secondary | ICD-10-CM | POA: Diagnosis not present

## 2023-09-15 DIAGNOSIS — S51001D Unspecified open wound of right elbow, subsequent encounter: Secondary | ICD-10-CM | POA: Diagnosis not present

## 2023-09-15 DIAGNOSIS — Z0000 Encounter for general adult medical examination without abnormal findings: Secondary | ICD-10-CM | POA: Diagnosis not present

## 2023-09-15 DIAGNOSIS — M810 Age-related osteoporosis without current pathological fracture: Secondary | ICD-10-CM | POA: Diagnosis not present

## 2023-09-15 DIAGNOSIS — E559 Vitamin D deficiency, unspecified: Secondary | ICD-10-CM | POA: Diagnosis not present

## 2023-09-15 DIAGNOSIS — M069 Rheumatoid arthritis, unspecified: Secondary | ICD-10-CM | POA: Diagnosis not present

## 2023-09-15 DIAGNOSIS — S81802D Unspecified open wound, left lower leg, subsequent encounter: Secondary | ICD-10-CM | POA: Diagnosis not present

## 2023-09-15 DIAGNOSIS — I700 Atherosclerosis of aorta: Secondary | ICD-10-CM | POA: Diagnosis not present

## 2023-09-15 DIAGNOSIS — D631 Anemia in chronic kidney disease: Secondary | ICD-10-CM | POA: Diagnosis not present

## 2023-09-15 DIAGNOSIS — I130 Hypertensive heart and chronic kidney disease with heart failure and stage 1 through stage 4 chronic kidney disease, or unspecified chronic kidney disease: Secondary | ICD-10-CM | POA: Diagnosis not present

## 2023-09-15 DIAGNOSIS — E46 Unspecified protein-calorie malnutrition: Secondary | ICD-10-CM | POA: Diagnosis not present

## 2023-09-23 DIAGNOSIS — S51001D Unspecified open wound of right elbow, subsequent encounter: Secondary | ICD-10-CM | POA: Diagnosis not present

## 2023-09-23 DIAGNOSIS — I130 Hypertensive heart and chronic kidney disease with heart failure and stage 1 through stage 4 chronic kidney disease, or unspecified chronic kidney disease: Secondary | ICD-10-CM | POA: Diagnosis not present

## 2023-09-23 DIAGNOSIS — N1831 Chronic kidney disease, stage 3a: Secondary | ICD-10-CM | POA: Diagnosis not present

## 2023-09-23 DIAGNOSIS — S81802D Unspecified open wound, left lower leg, subsequent encounter: Secondary | ICD-10-CM | POA: Diagnosis not present

## 2023-09-23 DIAGNOSIS — M069 Rheumatoid arthritis, unspecified: Secondary | ICD-10-CM | POA: Diagnosis not present

## 2023-09-23 DIAGNOSIS — I5042 Chronic combined systolic (congestive) and diastolic (congestive) heart failure: Secondary | ICD-10-CM | POA: Diagnosis not present

## 2023-09-23 DIAGNOSIS — E46 Unspecified protein-calorie malnutrition: Secondary | ICD-10-CM | POA: Diagnosis not present

## 2023-09-23 DIAGNOSIS — E039 Hypothyroidism, unspecified: Secondary | ICD-10-CM | POA: Diagnosis not present

## 2023-09-23 DIAGNOSIS — D631 Anemia in chronic kidney disease: Secondary | ICD-10-CM | POA: Diagnosis not present

## 2023-10-15 ENCOUNTER — Ambulatory Visit: Payer: Medicare PPO

## 2023-10-15 DIAGNOSIS — I428 Other cardiomyopathies: Secondary | ICD-10-CM | POA: Diagnosis not present

## 2023-10-15 LAB — CUP PACEART REMOTE DEVICE CHECK
Battery Remaining Longevity: 80 mo
Battery Remaining Percentage: 72 %
Battery Voltage: 2.99 V
Brady Statistic AP VP Percent: 45 %
Brady Statistic AP VS Percent: 1 %
Brady Statistic AS VP Percent: 54 %
Brady Statistic AS VS Percent: 1 %
Brady Statistic RA Percent Paced: 44 %
Brady Statistic RV Percent Paced: 99 %
Date Time Interrogation Session: 20250807020014
Implantable Lead Connection Status: 753985
Implantable Lead Connection Status: 753985
Implantable Lead Implant Date: 20220505
Implantable Lead Implant Date: 20220505
Implantable Lead Location: 753859
Implantable Lead Location: 753860
Implantable Pulse Generator Implant Date: 20220505
Lead Channel Impedance Value: 390 Ohm
Lead Channel Impedance Value: 580 Ohm
Lead Channel Pacing Threshold Amplitude: 0.5 V
Lead Channel Pacing Threshold Amplitude: 0.75 V
Lead Channel Pacing Threshold Pulse Width: 0.5 ms
Lead Channel Pacing Threshold Pulse Width: 0.5 ms
Lead Channel Sensing Intrinsic Amplitude: 1.5 mV
Lead Channel Sensing Intrinsic Amplitude: 5.1 mV
Lead Channel Setting Pacing Amplitude: 1.5 V
Lead Channel Setting Pacing Amplitude: 2 V
Lead Channel Setting Pacing Pulse Width: 0.5 ms
Lead Channel Setting Sensing Sensitivity: 2 mV
Pulse Gen Model: 2272
Pulse Gen Serial Number: 3920272

## 2023-10-21 ENCOUNTER — Ambulatory Visit: Payer: Self-pay | Admitting: Cardiology

## 2023-11-07 ENCOUNTER — Other Ambulatory Visit: Payer: Self-pay

## 2023-11-07 ENCOUNTER — Emergency Department (HOSPITAL_COMMUNITY)

## 2023-11-07 ENCOUNTER — Observation Stay (HOSPITAL_COMMUNITY)
Admission: EM | Admit: 2023-11-07 | Discharge: 2023-11-09 | Disposition: A | Discharge status: Home Health | Attending: Gastroenterology | Admitting: Gastroenterology

## 2023-11-07 ENCOUNTER — Encounter (HOSPITAL_COMMUNITY): Payer: Self-pay | Admitting: Family Medicine

## 2023-11-07 DIAGNOSIS — R1084 Generalized abdominal pain: Secondary | ICD-10-CM | POA: Diagnosis not present

## 2023-11-07 DIAGNOSIS — E039 Hypothyroidism, unspecified: Secondary | ICD-10-CM | POA: Diagnosis not present

## 2023-11-07 DIAGNOSIS — K269 Duodenal ulcer, unspecified as acute or chronic, without hemorrhage or perforation: Secondary | ICD-10-CM | POA: Diagnosis present

## 2023-11-07 DIAGNOSIS — E785 Hyperlipidemia, unspecified: Secondary | ICD-10-CM | POA: Diagnosis not present

## 2023-11-07 DIAGNOSIS — M6281 Muscle weakness (generalized): Secondary | ICD-10-CM | POA: Insufficient documentation

## 2023-11-07 DIAGNOSIS — K921 Melena: Secondary | ICD-10-CM | POA: Diagnosis not present

## 2023-11-07 DIAGNOSIS — K449 Diaphragmatic hernia without obstruction or gangrene: Secondary | ICD-10-CM | POA: Diagnosis not present

## 2023-11-07 DIAGNOSIS — D62 Acute posthemorrhagic anemia: Secondary | ICD-10-CM | POA: Diagnosis not present

## 2023-11-07 DIAGNOSIS — K625 Hemorrhage of anus and rectum: Secondary | ICD-10-CM

## 2023-11-07 DIAGNOSIS — R42 Dizziness and giddiness: Secondary | ICD-10-CM | POA: Diagnosis not present

## 2023-11-07 DIAGNOSIS — R58 Hemorrhage, not elsewhere classified: Secondary | ICD-10-CM | POA: Diagnosis not present

## 2023-11-07 DIAGNOSIS — K259 Gastric ulcer, unspecified as acute or chronic, without hemorrhage or perforation: Secondary | ICD-10-CM | POA: Diagnosis present

## 2023-11-07 DIAGNOSIS — R2681 Unsteadiness on feet: Secondary | ICD-10-CM | POA: Insufficient documentation

## 2023-11-07 DIAGNOSIS — I10 Essential (primary) hypertension: Secondary | ICD-10-CM | POA: Diagnosis not present

## 2023-11-07 DIAGNOSIS — D509 Iron deficiency anemia, unspecified: Secondary | ICD-10-CM | POA: Diagnosis not present

## 2023-11-07 DIAGNOSIS — R578 Other shock: Secondary | ICD-10-CM

## 2023-11-07 DIAGNOSIS — I701 Atherosclerosis of renal artery: Secondary | ICD-10-CM | POA: Diagnosis not present

## 2023-11-07 DIAGNOSIS — K274 Chronic or unspecified peptic ulcer, site unspecified, with hemorrhage: Secondary | ICD-10-CM | POA: Diagnosis not present

## 2023-11-07 DIAGNOSIS — K922 Gastrointestinal hemorrhage, unspecified: Secondary | ICD-10-CM | POA: Diagnosis not present

## 2023-11-07 DIAGNOSIS — K8689 Other specified diseases of pancreas: Secondary | ICD-10-CM | POA: Diagnosis not present

## 2023-11-07 DIAGNOSIS — I959 Hypotension, unspecified: Secondary | ICD-10-CM | POA: Diagnosis not present

## 2023-11-07 DIAGNOSIS — Q630 Accessory kidney: Secondary | ICD-10-CM | POA: Diagnosis not present

## 2023-11-07 DIAGNOSIS — R55 Syncope and collapse: Secondary | ICD-10-CM | POA: Diagnosis not present

## 2023-11-07 DIAGNOSIS — R918 Other nonspecific abnormal finding of lung field: Secondary | ICD-10-CM | POA: Diagnosis not present

## 2023-11-07 LAB — PROTIME-INR
INR: 1.1 (ref 0.8–1.2)
Prothrombin Time: 15 s (ref 11.4–15.2)

## 2023-11-07 LAB — CBC WITH DIFFERENTIAL/PLATELET
Abs Immature Granulocytes: 1.48 K/uL — ABNORMAL HIGH (ref 0.00–0.07)
Basophils Absolute: 0.1 K/uL (ref 0.0–0.1)
Basophils Relative: 1 %
Eosinophils Absolute: 0.2 K/uL (ref 0.0–0.5)
Eosinophils Relative: 1 %
HCT: 25.9 % — ABNORMAL LOW (ref 36.0–46.0)
Hemoglobin: 8 g/dL — ABNORMAL LOW (ref 12.0–15.0)
Immature Granulocytes: 8 %
Lymphocytes Relative: 14 %
Lymphs Abs: 2.5 K/uL (ref 0.7–4.0)
MCH: 32 pg (ref 26.0–34.0)
MCHC: 30.9 g/dL (ref 30.0–36.0)
MCV: 103.6 fL — ABNORMAL HIGH (ref 80.0–100.0)
Monocytes Absolute: 1.2 K/uL — ABNORMAL HIGH (ref 0.1–1.0)
Monocytes Relative: 7 %
Neutro Abs: 12.5 K/uL — ABNORMAL HIGH (ref 1.7–7.7)
Neutrophils Relative %: 69 %
Platelets: 217 K/uL (ref 150–400)
RBC: 2.5 MIL/uL — ABNORMAL LOW (ref 3.87–5.11)
RDW: 16.3 % — ABNORMAL HIGH (ref 11.5–15.5)
Smear Review: NORMAL
WBC: 18 K/uL — ABNORMAL HIGH (ref 4.0–10.5)
nRBC: 0.3 % — ABNORMAL HIGH (ref 0.0–0.2)

## 2023-11-07 LAB — C DIFFICILE QUICK SCREEN W PCR REFLEX
C Diff antigen: NEGATIVE
C Diff interpretation: NOT DETECTED
C Diff toxin: NEGATIVE

## 2023-11-07 LAB — COMPREHENSIVE METABOLIC PANEL WITH GFR
ALT: 16 U/L (ref 0–44)
AST: 22 U/L (ref 15–41)
Albumin: 1.9 g/dL — ABNORMAL LOW (ref 3.5–5.0)
Alkaline Phosphatase: 44 U/L (ref 38–126)
Anion gap: 13 (ref 5–15)
BUN: 48 mg/dL — ABNORMAL HIGH (ref 8–23)
CO2: 20 mmol/L — ABNORMAL LOW (ref 22–32)
Calcium: 8 mg/dL — ABNORMAL LOW (ref 8.9–10.3)
Chloride: 100 mmol/L (ref 98–111)
Creatinine, Ser: 0.97 mg/dL (ref 0.44–1.00)
GFR, Estimated: 56 mL/min — ABNORMAL LOW (ref >60)
Glucose, Bld: 121 mg/dL — ABNORMAL HIGH (ref 70–99)
Potassium: 4.7 mmol/L (ref 3.5–5.1)
Sodium: 133 mmol/L — ABNORMAL LOW (ref 135–145)
Total Bilirubin: 0.5 mg/dL (ref 0.0–1.2)
Total Protein: 4.6 g/dL — ABNORMAL LOW (ref 6.5–8.1)

## 2023-11-07 LAB — CBC
HCT: 32 % — ABNORMAL LOW (ref 36.0–46.0)
Hemoglobin: 11.1 g/dL — ABNORMAL LOW (ref 12.0–15.0)
MCH: 31.2 pg (ref 26.0–34.0)
MCHC: 34.7 g/dL (ref 30.0–36.0)
MCV: 89.9 fL (ref 80.0–100.0)
Platelets: 159 K/uL (ref 150–400)
RBC: 3.56 MIL/uL — ABNORMAL LOW (ref 3.87–5.11)
RDW: 17.1 % — ABNORMAL HIGH (ref 11.5–15.5)
WBC: 15 K/uL — ABNORMAL HIGH (ref 4.0–10.5)
nRBC: 0.3 % — ABNORMAL HIGH (ref 0.0–0.2)

## 2023-11-07 LAB — I-STAT CG4 LACTIC ACID, ED
Lactic Acid, Venous: 3.4 mmol/L (ref 0.5–1.9)
Lactic Acid, Venous: 2.6 mmol/L (ref 0.5–1.9)

## 2023-11-07 LAB — I-STAT CHEM 8, ED
BUN: 45 mg/dL — ABNORMAL HIGH (ref 8–23)
Calcium, Ion: 1.02 mmol/L — ABNORMAL LOW (ref 1.15–1.40)
Chloride: 102 mmol/L (ref 98–111)
Creatinine, Ser: 1 mg/dL (ref 0.44–1.00)
Glucose, Bld: 121 mg/dL — ABNORMAL HIGH (ref 70–99)
HCT: 22 % — ABNORMAL LOW (ref 36.0–46.0)
Hemoglobin: 7.5 g/dL — ABNORMAL LOW (ref 12.0–15.0)
Potassium: 4.6 mmol/L (ref 3.5–5.1)
Sodium: 133 mmol/L — ABNORMAL LOW (ref 135–145)
TCO2: 22 mmol/L (ref 22–32)

## 2023-11-07 LAB — APTT: aPTT: 24 s (ref 24–36)

## 2023-11-07 LAB — HEMOGLOBIN AND HEMATOCRIT, BLOOD
HCT: 34.3 % — ABNORMAL LOW (ref 36.0–46.0)
Hemoglobin: 11.3 g/dL — ABNORMAL LOW (ref 12.0–15.0)

## 2023-11-07 LAB — PREPARE RBC (CROSSMATCH)

## 2023-11-07 LAB — POC OCCULT BLOOD, ED: Fecal Occult Bld: POSITIVE — AB

## 2023-11-07 MED ORDER — PANTOPRAZOLE SODIUM 40 MG IV SOLR: 40.0000 mg | Freq: Once | INTRAVENOUS | Status: DC

## 2023-11-07 MED ORDER — ONDANSETRON HCL 4 MG PO TABS: 4.0000 mg | ORAL_TABLET | Freq: Four times a day (QID) | ORAL | Status: DC | PRN

## 2023-11-07 MED ORDER — PANTOPRAZOLE SODIUM 40 MG IV SOLR
80.0000 mg | Freq: Once | INTRAVENOUS | Status: AC
  Administered 2023-11-07: 80 mg via INTRAVENOUS
  Filled 2023-11-07: qty 20

## 2023-11-07 MED ORDER — LEVOTHYROXINE SODIUM 25 MCG PO TABS
25.0000 ug | ORAL_TABLET | Freq: Every day | ORAL | Status: DC
  Administered 2023-11-07 – 2023-11-09 (×3): 25 ug via ORAL
  Filled 2023-11-07 (×3): qty 1

## 2023-11-07 MED ORDER — CALCIUM GLUCONATE-NACL 1-0.675 GM/50ML-% IV SOLN
1.0000 g | Freq: Once | INTRAVENOUS | Status: AC
  Administered 2023-11-07: 1000 mg via INTRAVENOUS
  Filled 2023-11-07: qty 50

## 2023-11-07 MED ORDER — PREDNISONE 10 MG PO TABS
5.0000 mg | ORAL_TABLET | Freq: Every day | ORAL | Status: DC
  Administered 2023-11-07 – 2023-11-09 (×3): 5 mg via ORAL
  Filled 2023-11-07 (×3): qty 1

## 2023-11-07 MED ORDER — PANTOPRAZOLE SODIUM 40 MG IV SOLR
40.0000 mg | Freq: Two times a day (BID) | INTRAVENOUS | Status: DC
  Administered 2023-11-07 – 2023-11-09 (×4): 40 mg via INTRAVENOUS
  Filled 2023-11-07 (×6): qty 10

## 2023-11-07 MED ORDER — ONDANSETRON HCL 4 MG/2ML IJ SOLN: 4.0000 mg | Freq: Four times a day (QID) | INTRAMUSCULAR | Status: DC | PRN

## 2023-11-07 MED ORDER — SODIUM CHLORIDE 0.9% IV SOLUTION: Freq: Once | INTRAVENOUS | Status: DC

## 2023-11-07 MED ORDER — SODIUM CHLORIDE 0.9 % IV SOLN: INTRAVENOUS | Status: DC

## 2023-11-07 MED ORDER — SODIUM CHLORIDE 0.9 % IV BOLUS
500.0000 mL | Freq: Once | INTRAVENOUS | Status: AC
  Administered 2023-11-07: 500 mL via INTRAVENOUS

## 2023-11-07 MED ORDER — EZETIMIBE 10 MG PO TABS
10.0000 mg | ORAL_TABLET | Freq: Every day | ORAL | Status: DC
  Administered 2023-11-07 – 2023-11-09 (×3): 10 mg via ORAL
  Filled 2023-11-07 (×3): qty 1

## 2023-11-07 MED ORDER — FOLIC ACID 1 MG PO TABS
1.0000 mg | ORAL_TABLET | Freq: Two times a day (BID) | ORAL | Status: DC
  Administered 2023-11-07 – 2023-11-09 (×4): 1 mg via ORAL
  Filled 2023-11-07 (×5): qty 1

## 2023-11-07 MED ORDER — IOHEXOL 350 MG/ML SOLN
75.0000 mL | Freq: Once | INTRAVENOUS | Status: AC | PRN
  Administered 2023-11-07: 75 mL via INTRAVENOUS

## 2023-11-07 NOTE — ED Notes (Addendum)
 Back from CT. No changes. Pt received 2 units O neg, emergency release PRBCs.  Holding on giving any more T&C units at this time per EDP.

## 2023-11-07 NOTE — ED Notes (Signed)
 Lab called to notify ED that: they are able to run c-diff, but will need another sample to run GI panel. Quantity was insufficient.

## 2023-11-07 NOTE — ED Notes (Signed)
 Pt alert, NAD, calm, interactive, talkative, able to communicate her needs, skin/ arms sensitive, TTP, ppropriate, speaking in clear complete sentences, EDP at [facility].

## 2023-11-07 NOTE — ED Triage Notes (Signed)
 Patient from home for eval of GI bleeding this morning. Copious clots and blood noted in bed by EMS. BP 65/30 on arrival to ED. No PIV with EMS.

## 2023-11-07 NOTE — ED Provider Notes (Signed)
 Pamela Dunn Provider Note   CSN: [id number] Arrival date & time: 11/07/23  1031     Patient presents with: GI Bleeding   Pamela Dunn is a 89 y.o. female.   89 year old female with a history of CHF, complete heart block status post pacemaker, CKD, ischemic cardiomyopathy, and peptic ulcer disease who presents to the emergency department with rectal bleeding.  History obtained per EMS and the patient's son.  Had some shortness of breath yesterday and then this morning was found covered in loose stools that were bloody.  911 was called.  Is not on any blood thinners currently but says that she does take aspirin  frequently is on prednisone .  Last EGD was in 2011 where she had a stomach ulcer and tubular adenoma of the colon but repeat colonoscopy in 2015 was normal.  Did go on Eliquis  for an upper extremity DVT but has been off of it for months       Prior to Admission medications   Medication Sig Start Date End Date Taking? Authorizing Provider  cholecalciferol  (VITAMIN D ) 1000 UNITS tablet Take 1,000 Units by mouth daily.    Yes [provider]  ezetimibe  (ZETIA ) 10 MG tablet Take 10 mg by mouth daily.    Yes [provider]  folic acid  (FOLVITE ) 1 MG tablet Take 1 mg by mouth 2 (two) times daily. 02/09/23  Yes [provider]  furosemide  (LASIX ) 20 MG tablet TAKE 1 TABLET BY MOUTH EVERY DAY AS NEEDED 07/14/23  Yes Dick, Ernest H Jr., NP  guaiFENesin (MUCINEX) 600 MG 12 hr tablet Take 600 mg by mouth 2 (two) times daily as needed for cough or to loosen phlegm.   Yes [provider]  isosorbide -hydrALAZINE  (BIDIL ) 20-37.5 MG tablet Take 1 tablet by mouth 3 (three) times daily. 05/01/23  Yes Ladona Heinz, MD  levothyroxine  (SYNTHROID ) 25 MCG tablet Take 25 mcg by mouth daily.    Yes [provider]  metoprolol  succinate (TOPROL -XL) 25 MG 24 hr tablet Take 1 tablet (25 mg total) by mouth daily. 04/07/23  Yes  Ladona Heinz, MD  Multiple Vitamins-Minerals (PRESERVISION AREDS PO) Take 1 Dose by mouth 2 (two) times daily.   Yes [provider]  Omega-3 Fatty Acids (OMEGA-3 FISH OIL PO) Take 1 capsule by mouth in the morning and at bedtime.   Yes [provider]  polyethylene glycol (MIRALAX  / GLYCOLAX ) 17 g packet Take 17 g by mouth daily.   Yes [provider]  predniSONE  (DELTASONE ) 5 MG tablet Take 5 mg by mouth daily. 11/03/18  Yes [provider]    Allergies: Coreg  [carvedilol ] and Lorazepam    Review of Systems  Updated Vital Signs BP (!) 147/80   Pulse (!) 102   Temp 98.5 F (36.9 C) (Oral)   Resp 18   SpO2 100%   Physical Exam Vitals and nursing note reviewed.  Constitutional:      General: She is not in acute distress.    Appearance: She is well-developed.  HENT:     Head: Normocephalic and atraumatic.     Right Ear: External ear normal.     Left Ear: External ear normal.     Nose: Nose normal.  Eyes:     Extraocular Movements: Extraocular movements intact.     Conjunctiva/sclera: Conjunctivae normal.     Pupils: Pupils are equal, round, and reactive to light.  Cardiovascular:     Rate and Rhythm: Normal  rate and regular rhythm.     Heart sounds: No murmur heard. Pulmonary:     Effort: Pulmonary effort is normal. No respiratory distress.     Breath sounds: Normal breath sounds.  Abdominal:     General: Abdomen is flat. There is no distension.     Palpations: Abdomen is soft. There is no mass.     Tenderness: There is no abdominal tenderness. There is no guarding.  Genitourinary:    Comments: Chaperoned by patient's RN Pamela Dunn. No external hemorrhoids or fissures noted on external inspection. No internal masses or hemorroids noted. Normal rectal tone.  Maroon-colored stool noted Musculoskeletal:     Cervical back: Normal range of motion and neck supple.     Right lower leg: Edema present.     Left lower leg: Edema present.  Skin:     General: Skin is warm and dry.  Neurological:     Mental Status: She is alert and oriented to person, place, and time. Mental status is at baseline.  Psychiatric:        Mood and Affect: Mood normal.     (all labs ordered are listed, but only abnormal results are displayed) Labs Reviewed  COMPREHENSIVE METABOLIC PANEL WITH GFR - Abnormal; Notable for the following components:      Result Value   Sodium 133 (*)    CO2 20 (*)    Glucose, Bld 121 (*)    BUN 48 (*)    Calcium  8.0 (*)    Total Protein 4.6 (*)    Albumin 1.9 (*)    GFR, Estimated 56 (*)    All other components within normal limits  CBC WITH DIFFERENTIAL/PLATELET - Abnormal; Notable for the following components:   WBC 18.0 (*)    RBC 2.50 (*)    Hemoglobin 8.0 (*)    HCT 25.9 (*)    MCV 103.6 (*)    RDW 16.3 (*)    nRBC 0.3 (*)    Neutro Abs 12.5 (*)    Monocytes Absolute 1.2 (*)    Abs Immature Granulocytes 1.48 (*)    All other components within normal limits  HEMOGLOBIN AND HEMATOCRIT, BLOOD - Abnormal; Notable for the following components:   Hemoglobin 11.3 (*)    HCT 34.3 (*)    All other components within normal limits  I-STAT CHEM 8, ED - Abnormal; Notable for the following components:   Sodium 133 (*)    BUN 45 (*)    Glucose, Bld 121 (*)    Calcium , Ion 1.02 (*)    Hemoglobin 7.5 (*)    HCT 22.0 (*)    All other components within normal limits  I-STAT CG4 LACTIC ACID, ED - Abnormal; Notable for the following components:   Lactic Acid, Venous 3.4 (*)    All other components within normal limits  POC OCCULT BLOOD, ED - Abnormal; Notable for the following components:   Fecal Occult Bld POSITIVE (*)    All other components within normal limits  I-STAT CG4 LACTIC ACID, ED - Abnormal; Notable for the following components:   Lactic Acid, Venous 2.6 (*)    All other components within normal limits  C DIFFICILE QUICK SCREEN W PCR REFLEX    GASTROINTESTINAL PANEL BY PCR, STOOL (REPLACES STOOL CULTURE)   PROTIME-INR  APTT  CBC  TYPE AND SCREEN  PREPARE RBC (CROSSMATCH)    EKG: None  Radiology: CT ANGIO GI BLEED Result Date: 11/07/2023 EXAM: CTA ABDOMEN AND PELVIS WITH CONTRAST 11/07/2023  01:22:13 PM TECHNIQUE: CTA images of the abdomen and pelvis with intravenous contrast. Three-dimensional MIP/volume rendered formations were performed. Automated exposure control, iterative reconstruction, and/or weight based adjustment of the mA/kV was utilized to reduce the radiation dose to as low as reasonably achievable. COMPARISON: CT 01/06/2023 CLINICAL HISTORY: GI bleed. FINDINGS: VASCULATURE: Extensive calcified aortoiliac atheromatous plaque without aneurysm. Calcified plaque at the origin of the right renal artery resulting in short segment stenosis of at least moderate severity, patent distally. Duplicated left renal arteries, superior dominant, both patent. GI BLEED: No evidence of active GI bleed or acute GI pathology. AORTA: No acute finding. No abdominal aortic aneurysm. No dissection. CELIAC TRUNK: No acute finding. No occlusion or significant stenosis. SUPERIOR MESEMTRIC ARTERY: No acute finding. No occlusion or significant stenosis. INFERIOR MESENTERIC ARTERY: No acute finding. No occlusion or significant stenosis. RENAL ARTERIES: Calcified plaque at the origin of the right renal artery resulting in short segment stenosis of at least moderate severity, patent distally. Duplicated left renal arteries, superior dominant, both patent. ILIAC ARTERIES: No acute finding. Extensive calcified plaque. No occlusion or significant stenosis. ABDOMEN/PELVIS: LOWER CHEST: New small left pleural effusion with dependent consolidation/atelectasis posteriorly in the left lower lobe. There is some patchy opacities peripherally at the right lung base. Transvenous pacing leads partially visualized. LIVER: The liver is unremarkable. GALLBLADDER AND BILE DUCTS: Subcentimeter partially calcified gallstone in the dependent  aspect of the nondilated gallbladder. No biliary ductal dilatation. SPLEEN: The spleen is unremarkable. PANCREAS: main duct dilatation in the pancreatic body, progressive since prior exam without discrete obstructing lesion or regional inflammatory changes. ADRENAL GLANDS: Bilateral adrenal glands demonstrate no acute abnormality. KIDNEYS, URETERS AND BLADDER: Stable left upper pole renal cortical cyst. No stones in the kidneys or ureters. No hydronephrosis. No perinephric or periureteral stranding. Urinary bladder is unremarkable. GI AND BOWEL: Anastomotic staple lines in the right lower quadrant. Small hiatal hernia. A few small diverticula near the descending/sigmoid junction without adjacent inflammatory change. No evidence of active GI bleed or acute GI pathology. REPRODUCTIVE: Reproductive organs are unremarkable. PERITONEUM AND RETROPERITONEUM: No ascites or free air. LYMPH NODES: No lymphadenopathy. BONES AND SOFT TISSUES: Bilateral hip DJD. Multilevel lumbar spondylitic change. No acute abnormality of the bones. No acute soft tissue abnormality. IMPRESSION: 1. No evidence of active GI bleed or acute GI pathology. 2. Main duct dilatation in the pancreatic body, progressive since prior exam without discrete obstructing lesion or regional inflammatory changes. Elective MRCP or ERCP may be useful for further evaluation. 3. New small left pleural effusion with dependent consolidation/atelectasis posteriorly in the left lower lobe. Patchy opacities peripherally at the right lung base. Electronically signed by: Dayne Hassell MD 11/07/2023 01:42 PM EDT RP Workstation: [id number]     Procedures   Medications Ordered in the ED  0.9 %  sodium chloride  infusion (Manually program via Guardrails IV Fluids) (has no administration in time range)  ezetimibe  (ZETIA ) tablet 10 mg (has no administration in time range)  levothyroxine  (SYNTHROID ) tablet 25 mcg (has no administration in time range)  predniSONE   (DELTASONE ) tablet 5 mg (has no administration in time range)  folic acid  (FOLVITE ) tablet 1 mg (has no administration in time range)  ondansetron  (ZOFRAN ) tablet 4 mg (has no administration in time range)    Or  ondansetron  (ZOFRAN ) injection 4 mg (has no administration in time range)  pantoprazole  (PROTONIX ) injection 40 mg (has no administration in time range)  sodium chloride  0.9 % bolus 500 mL (0 mLs Intravenous Stopped 11/07/23 1150)  pantoprazole  (PROTONIX ) injection 80 mg (80 mg Intravenous Given 11/07/23 1136)  calcium  gluconate 1 g/ 50 mL sodium chloride  IVPB (0 mg Intravenous Stopped 11/07/23 1310)  iohexol  (OMNIPAQUE ) 350 MG/ML injection 75 mL (75 mLs Intravenous Contrast Given 11/07/23 1321)    Clinical Course as of 11/07/23 1552  Sat Nov 07, 2023  1058 Attempted to call Pamela Dunn and Pamela Dunn x2 without response.  [RP]  1100 Per son Pamela pt makes her own medical decision.  [RP]  1159 Full code verified with patient.  [RP]  1404 Discussed with Harris NP from ICU who feels that progressive would be suitable for the patient.  [RP]  1415 Dr Rollin from GI consulted. Dr Phebe from hospitalist consulted from admission.  [RP]    Clinical Course User Index [RP] Yolande Lamar BROCKS, MD                                 Medical Decision Making Amount and/or Complexity of Data Reviewed Labs: ordered. Radiology: ordered.  Risk Prescription drug management. Decision regarding hospitalization.   DEMARIE UHLIG is a 89 year old female with a history of CHF, complete heart block status post pacemaker, CKD, ischemic cardiomyopathy, and peptic ulcer disease who presents to the emergency department with rectal bleeding.  Initial Ddx:  Hemorrhagic shock, GI bleed, coagulopathy, hypovolemic shock, infectious diarrhea  MDM/Course:  Patient resents emergency department with maroon-colored stool and hypotension.  On exam initial blood pressure was in the 60s systolics.  She did have Hemoccult  positive maroon-colored stool as well.  Initial hemoglobin was 8 with a baseline of 11 but since she appeared to be in shock she was given 2 emergency release units of blood as well as some IV fluids and upon re-evaluation blood pressure improved.  Not on any blood thinners that would require reversal.  She was given Protonix  as well and had a CTA that did not show any active bleeding.  Discussed with GI who will see the patient.  Admitted to hospitalist (Dr Vernon) for further management.  This patient presents to the ED for concern of complaints listed in HPI, this involves an extensive number of treatment options, and is a complaint that carries with it a high risk of complications and morbidity. Disposition including potential need for admission considered.   Dispo: Admit to Step Down  Additional history obtained from son Records reviewed Outpatient Clinic Notes The following labs were independently interpreted: hemoglobin and show acute anemia I independently reviewed the following imaging with scope of interpretation limited to determining acute life threatening conditions related to emergency care: CT Abdomen/Pelvis and agree with the radiologist interpretation with the following exceptions: none I personally reviewed and interpreted cardiac monitoring: Paced rhythm I have reviewed the patients home medications and made adjustments as needed Consults: Gastroenterology and Hospitalist Social Determinants of health:  Geriatric  CRITICAL CARE Performed by: Lamar BROCKS Yolande   Total critical care time: 30 minutes  Critical care time was exclusive of separately billable procedures and treating other patients.  Critical care was necessary to treat or prevent imminent or life-threatening deterioration.  Critical care was time spent personally by me on the following activities: development of treatment plan with patient and/or surrogate as well as nursing, discussions with consultants,  evaluation of patient's response to treatment, examination of patient, obtaining history from patient or surrogate, ordering and performing treatments and interventions, ordering and review of laboratory studies, ordering and  review of radiographic studies, pulse oximetry and re-evaluation of patient's condition.   Portions of this note were generated with Scientist, clinical (histocompatibility and immunogenetics). Dictation errors may occur despite best attempts at proofreading.     Final diagnoses:  Gastrointestinal hemorrhage, unspecified gastrointestinal hemorrhage type  Hemorrhagic shock Pamela Dunn)    ED Discharge Orders     None          Yolande Lamar BROCKS, MD 11/07/23 [phone]

## 2023-11-07 NOTE — ED Notes (Addendum)
 Casually talking with family and personal/ private caregiver. VSS. VS improved. No untoward changes.

## 2023-11-07 NOTE — ED Notes (Addendum)
 VSS. Pt next for CT. Alert, NAD, calm, interactive, talkative. Asymptomatic while lying supine. HOB 15 degrees. Calcium  infusing.

## 2023-11-07 NOTE — ED Notes (Signed)
 Admitting MD at [facility]. Family x1 at [facility] with pt's private caregiver. No changes. VSS. Stool sample sent.

## 2023-11-07 NOTE — Consult Note (Signed)
 Reason for Consult: Melena, hematochezia, and anemia Referring Physician: [facility]  Pamela Dunn HPI: This is a 89 year old female with a PMH of PUD with GI bleed (2005), GERD, complete heart block, HTN, and hyperlipidemia admitted for a GI bleed.  Her symptoms started acutely at home when she experience severe bilateral rib pain.  This was followed by diarrhea bowel movements, which were a mix of melenic stool and maroon stool.  The bleeding was noted by her two caretakers.  She was brought to the ER and she was noted to be hypotensive.  Her HGB declined from 10-111 g/dL down to a nadir of 7.5 g/dL.  She was also hypotensive and fluid resuscitation improved her blood pressure from 65/31 mmHg up to 147/80.  On a routine basis she uses prednisone  5 mg every day, but she denies using any NSAIDS.  The prednisone  is for her RA.  She had EGDs with Dr. Obie, but there were no acute findings.  The last coloscopy was on 07/06/2013 and it was a normal examination.  No evidence of diverticula.  TLC  Date Value Ref Range Status  07/15/2017 8.74 L Final  .    Past Medical History:  Diagnosis Date   Allergy    seasonal   Anemia    Arthritis    Encounter for care of pacemaker 07/12/2020   GERD (gastroesophageal reflux disease)    Heart block AV complete (HCC) 07/11/2020   Hyperlipidemia    Hypertension    Pacemaker [employer] Assurity MRI model [id number] 07/12/2020 07/12/2020    Past Surgical History:  Procedure Laterality Date   APPENDECTOMY     BACK SURGERY     BOWEL RESECTION N/A 08/24/2019   Procedure: SMALL BOWEL RESECTION;  Surgeon: Vernetta Berg, MD;  Location: [facility];  Service: General;  Laterality: N/A;   LAPAROSCOPY N/A 08/24/2019   Procedure: LAPAROSCOPY DIAGNOSTIC;  Surgeon: Vernetta Berg, MD;  Location: [facility];  Service: General;  Laterality: N/A;   LAPAROTOMY N/A 08/24/2019   Procedure: EXPLORATORY LAPAROTOMY;  Surgeon: Vernetta Berg, MD;  Location: [facility];  Service: General;   Laterality: N/A;   PACEMAKER IMPLANT N/A 07/12/2020   Procedure: PACEMAKER IMPLANT;  Surgeon: Kelsie Agent, MD;  Location: [facility];  Service: Cardiovascular;  Laterality: N/A;    Family History  Problem Relation Age of Onset   Cancer Mother    Cancer - Colon Sister    Heart disease Sister    Brain cancer Brother    Cancer - Colon Brother    Cancer - Colon Brother    Breast cancer Maternal Aunt     Social History:  reports that she has never smoked. She has never used smokeless tobacco. She reports that she does not drink alcohol  and does not use drugs.  Allergies:  Allergies  Allergen Reactions   Coreg  [Carvedilol ] Diarrhea   Lorazepam Diarrhea    Medications: Scheduled:  sodium chloride    Intravenous Once   ezetimibe   10 mg Oral Daily   folic acid   1 mg Oral BID   levothyroxine   25 mcg Oral Daily   pantoprazole  (PROTONIX ) IV  40 mg Intravenous BID   predniSONE   5 mg Oral Daily   Continuous:  Results for orders placed or performed during the hospital encounter of 11/07/23 (from the past 24 hours)  Comprehensive metabolic panel     Status: Abnormal   Collection Time: 11/07/23 10:52 AM  Result Value Ref Range   Sodium 133 (L)  135 - 145 mmol/L   Potassium 4.7 3.5 - 5.1 mmol/L   Chloride 100 98 - 111 mmol/L   CO2 20 (L) 22 - 32 mmol/L   Glucose, Bld 121 (H) 70 - 99 mg/dL   BUN 48 (H) 8 - 23 mg/dL   Creatinine, Ser 9.02 0.44 - 1.00 mg/dL   Calcium  8.0 (L) 8.9 - 10.3 mg/dL   Total Protein 4.6 (L) 6.5 - 8.1 g/dL   Albumin 1.9 (L) 3.5 - 5.0 g/dL   AST 22 15 - 41 U/L   ALT 16 0 - 44 U/L   Alkaline Phosphatase 44 38 - 126 U/L   Total Bilirubin 0.5 0.0 - 1.2 mg/dL   GFR, Estimated 56 (L) >60 mL/min   Anion gap 13 5 - 15  CBC with Differential     Status: Abnormal   Collection Time: 11/07/23 10:52 AM  Result Value Ref Range   WBC 18.0 (H) 4.0 - 10.5 K/uL   RBC 2.50 (L) 3.87 - 5.11 MIL/uL   Hemoglobin 8.0 (L) 12.0 - 15.0 g/dL   HCT 74.0 (L) 63.9 - 53.9 %    MCV 103.6 (H) 80.0 - 100.0 fL   MCH 32.0 26.0 - 34.0 pg   MCHC 30.9 30.0 - 36.0 g/dL   RDW 83.6 (H) 88.4 - 84.4 %   Platelets 217 150 - 400 K/uL   nRBC 0.3 (H) 0.0 - 0.2 %   Neutrophils Relative % 69 %   Neutro Abs 12.5 (H) 1.7 - 7.7 K/uL   Lymphocytes Relative 14 %   Lymphs Abs 2.5 0.7 - 4.0 K/uL   Monocytes Relative 7 %   Monocytes Absolute 1.2 (H) 0.1 - 1.0 K/uL   Eosinophils Relative 1 %   Eosinophils Absolute 0.2 0.0 - 0.5 K/uL   Basophils Relative 1 %   Basophils Absolute 0.1 0.0 - 0.1 K/uL   WBC Morphology See Note    RBC Morphology MORPHOLOGY UNREMARKABLE    Smear Review Normal platelet morphology    Immature Granulocytes 8 %   Abs Immature Granulocytes 1.48 (H) 0.00 - 0.07 K/uL  Protime-INR     Status: None   Collection Time: 11/07/23 10:52 AM  Result Value Ref Range   Prothrombin Time 15.0 11.4 - 15.2 seconds   INR 1.1 0.8 - 1.2  APTT     Status: None   Collection Time: 11/07/23 10:52 AM  Result Value Ref Range   aPTT 24 24 - 36 seconds  Prepare RBC (crossmatch)     Status: None   Collection Time: 11/07/23 10:54 AM  Result Value Ref Range   Order Confirmation      ORDER PROCESSED BY BLOOD BANK Performed at [facility], [address], [place], [place] [address]   POC occult blood, ED     Status: Abnormal   Collection Time: 11/07/23 10:56 AM  Result Value Ref Range   Fecal Occult Bld POSITIVE (A) NEGATIVE  Type and screen [facility]     Status: None (Preliminary result)   Collection Time: 11/07/23 11:07 AM  Result Value Ref Range   ABO/RH(D) AB POS    Antibody Screen NEG    Sample Expiration 11/10/2023,2359    Unit Number [id number]    Blood Component Type RED CELLS,LR    Unit division 00    Status of Unit ALLOCATED    Transfusion Status OK TO TRANSFUSE    Crossmatch Result Compatible    Unit Number [id number]  Blood Component Type RED CELLS,LR    Unit division 00    Status of Unit ALLOCATED    Transfusion Status OK TO  TRANSFUSE    Crossmatch Result Compatible    Unit Number [id number]    Blood Component Type RED CELLS,LR    Unit division 00    Status of Unit ISSUED    Unit tag comment EMERGENCY RELEASE    Transfusion Status OK TO TRANSFUSE    Crossmatch Result COMPATIBLE    Unit Number [id number]    Blood Component Type RED CELLS,LR    Unit division 00    Status of Unit ISSUED    Unit tag comment EMERGENCY RELEASE    Transfusion Status OK TO TRANSFUSE    Crossmatch Result COMPATIBLE   I-Stat Chem 8, ED     Status: Abnormal   Collection Time: 11/07/23 11:18 AM  Result Value Ref Range   Sodium 133 (L) 135 - 145 mmol/L   Potassium 4.6 3.5 - 5.1 mmol/L   Chloride 102 98 - 111 mmol/L   BUN 45 (H) 8 - 23 mg/dL   Creatinine, Ser 8.99 0.44 - 1.00 mg/dL   Glucose, Bld 878 (H) 70 - 99 mg/dL   Calcium , Ion 1.02 (L) 1.15 - 1.40 mmol/L   TCO2 22 22 - 32 mmol/L   Hemoglobin 7.5 (L) 12.0 - 15.0 g/dL   HCT 77.9 (L) 63.9 - 53.9 %  I-Stat Lactic Acid, ED     Status: Abnormal   Collection Time: 11/07/23 11:18 AM  Result Value Ref Range   Lactic Acid, Venous 3.4 (HH) 0.5 - 1.9 mmol/L   Comment NOTIFIED PHYSICIAN   Hemoglobin and hematocrit, blood     Status: Abnormal   Collection Time: 11/07/23  1:27 PM  Result Value Ref Range   Hemoglobin 11.3 (L) 12.0 - 15.0 g/dL   HCT 65.6 (L) 63.9 - 53.9 %  I-Stat Lactic Acid, ED     Status: Abnormal   Collection Time: 11/07/23  1:38 PM  Result Value Ref Range   Lactic Acid, Venous 2.6 (HH) 0.5 - 1.9 mmol/L   Comment NOTIFIED PHYSICIAN   C Difficile Quick Screen w PCR reflex     Status: None   Collection Time: 11/07/23  1:57 PM   Specimen: STOOL  Result Value Ref Range   C Diff antigen NEGATIVE NEGATIVE   C Diff toxin NEGATIVE NEGATIVE   C Diff interpretation No C. difficile detected.      CT ANGIO GI BLEED Result Date: 11/07/2023 EXAM: CTA ABDOMEN AND PELVIS WITH CONTRAST 11/07/2023 01:22:13 PM TECHNIQUE: CTA images of the abdomen and pelvis with  intravenous contrast. Three-dimensional MIP/volume rendered formations were performed. Automated exposure control, iterative reconstruction, and/or weight based adjustment of the mA/kV was utilized to reduce the radiation dose to as low as reasonably achievable. COMPARISON: CT 01/06/2023 CLINICAL HISTORY: GI bleed. FINDINGS: VASCULATURE: Extensive calcified aortoiliac atheromatous plaque without aneurysm. Calcified plaque at the origin of the right renal artery resulting in short segment stenosis of at least moderate severity, patent distally. Duplicated left renal arteries, superior dominant, both patent. GI BLEED: No evidence of active GI bleed or acute GI pathology. AORTA: No acute finding. No abdominal aortic aneurysm. No dissection. CELIAC TRUNK: No acute finding. No occlusion or significant stenosis. SUPERIOR MESEMTRIC ARTERY: No acute finding. No occlusion or significant stenosis. INFERIOR MESENTERIC ARTERY: No acute finding. No occlusion or significant stenosis. RENAL ARTERIES: Calcified plaque at the origin of the right renal artery resulting in  short segment stenosis of at least moderate severity, patent distally. Duplicated left renal arteries, superior dominant, both patent. ILIAC ARTERIES: No acute finding. Extensive calcified plaque. No occlusion or significant stenosis. ABDOMEN/PELVIS: LOWER CHEST: New small left pleural effusion with dependent consolidation/atelectasis posteriorly in the left lower lobe. There is some patchy opacities peripherally at the right lung base. Transvenous pacing leads partially visualized. LIVER: The liver is unremarkable. GALLBLADDER AND BILE DUCTS: Subcentimeter partially calcified gallstone in the dependent aspect of the nondilated gallbladder. No biliary ductal dilatation. SPLEEN: The spleen is unremarkable. PANCREAS: main duct dilatation in the pancreatic body, progressive since prior exam without discrete obstructing lesion or regional inflammatory changes. ADRENAL  GLANDS: Bilateral adrenal glands demonstrate no acute abnormality. KIDNEYS, URETERS AND BLADDER: Stable left upper pole renal cortical cyst. No stones in the kidneys or ureters. No hydronephrosis. No perinephric or periureteral stranding. Urinary bladder is unremarkable. GI AND BOWEL: Anastomotic staple lines in the right lower quadrant. Small hiatal hernia. A few small diverticula near the descending/sigmoid junction without adjacent inflammatory change. No evidence of active GI bleed or acute GI pathology. REPRODUCTIVE: Reproductive organs are unremarkable. PERITONEUM AND RETROPERITONEUM: No ascites or free air. LYMPH NODES: No lymphadenopathy. BONES AND SOFT TISSUES: Bilateral hip DJD. Multilevel lumbar spondylitic change. No acute abnormality of the bones. No acute soft tissue abnormality. IMPRESSION: 1. No evidence of active GI bleed or acute GI pathology. 2. Main duct dilatation in the pancreatic body, progressive since prior exam without discrete obstructing lesion or regional inflammatory changes. Elective MRCP or ERCP may be useful for further evaluation. 3. New small left pleural effusion with dependent consolidation/atelectasis posteriorly in the left lower lobe. Patchy opacities peripherally at the right lung base. Electronically signed by: Dayne Hassell MD 11/07/2023 01:42 PM EDT RP Workstation: [id number]    ROS:  As stated above in the HPI otherwise negative.  Blood pressure (!) 147/80, pulse (!) 102, temperature 98.5 F (36.9 C), temperature source Oral, resp. rate 18, height 5' (1.524 m), weight 61.2 kg, SpO2 100%.    PE: Gen: NAD, Alert and Oriented HEENT:  [place]/AT, EOMI Neck: Supple, no LAD Lungs: CTA Bilaterally CV: RRR without M/G/R ABD: Soft, NTND, +BS Ext: No C/C/E  Assessment/Plan: 1) Probable upper GI bleed. 2) Chronic use of prednisone , but previously H pylori negative. 3) Anemia. 4) Hypotension - resolved.   The patient is hemodynamically stable.  It is best to  continue with resuscitation and perform an EGD tomorrow.  Plan: 1) Continue with IV hydration. 2) Follow HGB and transfuse as necessary. 3) Maintain PPI. 4) EGD tomorrow AM.  ROLLIN DOVER D 11/07/2023, 5:41 PM

## 2023-11-07 NOTE — ED Notes (Signed)
 No changes, remains alert, NAD, calm, interactive, talkative, able to communicate her needs, skin/ arms sensitive, TTP, ppropriate, speaking in clear complete sentences, EDP at [facility]. 1st unit of [facility] emerg blood infused. 2nd ordered and initiated. Family x2 at [facility].

## 2023-11-07 NOTE — ED Notes (Signed)
 O neg blood initiated emergency release.

## 2023-11-07 NOTE — H&P (View-Only) (Signed)
 Reason for Consult: Melena, hematochezia, and anemia Referring Physician: [facility]  Pamela Dunn HPI: This is a 89 year old female with a PMH of PUD with GI bleed (2005), GERD, complete heart block, HTN, and hyperlipidemia admitted for a GI bleed.  Her symptoms started acutely at home when she experience severe bilateral rib pain.  This was followed by diarrhea bowel movements, which were a mix of melenic stool and maroon stool.  The bleeding was noted by her two caretakers.  She was brought to the ER and she was noted to be hypotensive.  Her HGB declined from 10-111 g/dL down to a nadir of 7.5 g/dL.  She was also hypotensive and fluid resuscitation improved her blood pressure from 65/31 mmHg up to 147/80.  On a routine basis she uses prednisone  5 mg every day, but she denies using any NSAIDS.  The prednisone  is for her RA.  She had EGDs with Dr. Obie, but there were no acute findings.  The last coloscopy was on 07/06/2013 and it was a normal examination.  No evidence of diverticula.  TLC  Date Value Ref Range Status  07/15/2017 8.74 L Final  .    Past Medical History:  Diagnosis Date   Allergy    seasonal   Anemia    Arthritis    Encounter for care of pacemaker 07/12/2020   GERD (gastroesophageal reflux disease)    Heart block AV complete (HCC) 07/11/2020   Hyperlipidemia    Hypertension    Pacemaker [employer] Assurity MRI model [id number] 07/12/2020 07/12/2020    Past Surgical History:  Procedure Laterality Date   APPENDECTOMY     BACK SURGERY     BOWEL RESECTION N/A 08/24/2019   Procedure: SMALL BOWEL RESECTION;  Surgeon: Vernetta Berg, MD;  Location: [facility];  Service: General;  Laterality: N/A;   LAPAROSCOPY N/A 08/24/2019   Procedure: LAPAROSCOPY DIAGNOSTIC;  Surgeon: Vernetta Berg, MD;  Location: [facility];  Service: General;  Laterality: N/A;   LAPAROTOMY N/A 08/24/2019   Procedure: EXPLORATORY LAPAROTOMY;  Surgeon: Vernetta Berg, MD;  Location: [facility];  Service: General;   Laterality: N/A;   PACEMAKER IMPLANT N/A 07/12/2020   Procedure: PACEMAKER IMPLANT;  Surgeon: Kelsie Agent, MD;  Location: [facility];  Service: Cardiovascular;  Laterality: N/A;    Family History  Problem Relation Age of Onset   Cancer Mother    Cancer - Colon Sister    Heart disease Sister    Brain cancer Brother    Cancer - Colon Brother    Cancer - Colon Brother    Breast cancer Maternal Aunt     Social History:  reports that she has never smoked. She has never used smokeless tobacco. She reports that she does not drink alcohol  and does not use drugs.  Allergies:  Allergies  Allergen Reactions   Coreg  [Carvedilol ] Diarrhea   Lorazepam Diarrhea    Medications: Scheduled:  sodium chloride    Intravenous Once   ezetimibe   10 mg Oral Daily   folic acid   1 mg Oral BID   levothyroxine   25 mcg Oral Daily   pantoprazole  (PROTONIX ) IV  40 mg Intravenous BID   predniSONE   5 mg Oral Daily   Continuous:  Results for orders placed or performed during the hospital encounter of 11/07/23 (from the past 24 hours)  Comprehensive metabolic panel     Status: Abnormal   Collection Time: 11/07/23 10:52 AM  Result Value Ref Range   Sodium 133 (L)  135 - 145 mmol/L   Potassium 4.7 3.5 - 5.1 mmol/L   Chloride 100 98 - 111 mmol/L   CO2 20 (L) 22 - 32 mmol/L   Glucose, Bld 121 (H) 70 - 99 mg/dL   BUN 48 (H) 8 - 23 mg/dL   Creatinine, Ser 9.02 0.44 - 1.00 mg/dL   Calcium  8.0 (L) 8.9 - 10.3 mg/dL   Total Protein 4.6 (L) 6.5 - 8.1 g/dL   Albumin 1.9 (L) 3.5 - 5.0 g/dL   AST 22 15 - 41 U/L   ALT 16 0 - 44 U/L   Alkaline Phosphatase 44 38 - 126 U/L   Total Bilirubin 0.5 0.0 - 1.2 mg/dL   GFR, Estimated 56 (L) >60 mL/min   Anion gap 13 5 - 15  CBC with Differential     Status: Abnormal   Collection Time: 11/07/23 10:52 AM  Result Value Ref Range   WBC 18.0 (H) 4.0 - 10.5 K/uL   RBC 2.50 (L) 3.87 - 5.11 MIL/uL   Hemoglobin 8.0 (L) 12.0 - 15.0 g/dL   HCT 74.0 (L) 63.9 - 53.9 %    MCV 103.6 (H) 80.0 - 100.0 fL   MCH 32.0 26.0 - 34.0 pg   MCHC 30.9 30.0 - 36.0 g/dL   RDW 83.6 (H) 88.4 - 84.4 %   Platelets 217 150 - 400 K/uL   nRBC 0.3 (H) 0.0 - 0.2 %   Neutrophils Relative % 69 %   Neutro Abs 12.5 (H) 1.7 - 7.7 K/uL   Lymphocytes Relative 14 %   Lymphs Abs 2.5 0.7 - 4.0 K/uL   Monocytes Relative 7 %   Monocytes Absolute 1.2 (H) 0.1 - 1.0 K/uL   Eosinophils Relative 1 %   Eosinophils Absolute 0.2 0.0 - 0.5 K/uL   Basophils Relative 1 %   Basophils Absolute 0.1 0.0 - 0.1 K/uL   WBC Morphology See Note    RBC Morphology MORPHOLOGY UNREMARKABLE    Smear Review Normal platelet morphology    Immature Granulocytes 8 %   Abs Immature Granulocytes 1.48 (H) 0.00 - 0.07 K/uL  Protime-INR     Status: None   Collection Time: 11/07/23 10:52 AM  Result Value Ref Range   Prothrombin Time 15.0 11.4 - 15.2 seconds   INR 1.1 0.8 - 1.2  APTT     Status: None   Collection Time: 11/07/23 10:52 AM  Result Value Ref Range   aPTT 24 24 - 36 seconds  Prepare RBC (crossmatch)     Status: None   Collection Time: 11/07/23 10:54 AM  Result Value Ref Range   Order Confirmation      ORDER PROCESSED BY BLOOD BANK Performed at [facility], [address], [place], [place] [address]   POC occult blood, ED     Status: Abnormal   Collection Time: 11/07/23 10:56 AM  Result Value Ref Range   Fecal Occult Bld POSITIVE (A) NEGATIVE  Type and screen [facility]     Status: None (Preliminary result)   Collection Time: 11/07/23 11:07 AM  Result Value Ref Range   ABO/RH(D) AB POS    Antibody Screen NEG    Sample Expiration 11/10/2023,2359    Unit Number [id number]    Blood Component Type RED CELLS,LR    Unit division 00    Status of Unit ALLOCATED    Transfusion Status OK TO TRANSFUSE    Crossmatch Result Compatible    Unit Number [id number]  Blood Component Type RED CELLS,LR    Unit division 00    Status of Unit ALLOCATED    Transfusion Status OK TO  TRANSFUSE    Crossmatch Result Compatible    Unit Number [id number]    Blood Component Type RED CELLS,LR    Unit division 00    Status of Unit ISSUED    Unit tag comment EMERGENCY RELEASE    Transfusion Status OK TO TRANSFUSE    Crossmatch Result COMPATIBLE    Unit Number [id number]    Blood Component Type RED CELLS,LR    Unit division 00    Status of Unit ISSUED    Unit tag comment EMERGENCY RELEASE    Transfusion Status OK TO TRANSFUSE    Crossmatch Result COMPATIBLE   I-Stat Chem 8, ED     Status: Abnormal   Collection Time: 11/07/23 11:18 AM  Result Value Ref Range   Sodium 133 (L) 135 - 145 mmol/L   Potassium 4.6 3.5 - 5.1 mmol/L   Chloride 102 98 - 111 mmol/L   BUN 45 (H) 8 - 23 mg/dL   Creatinine, Ser 8.99 0.44 - 1.00 mg/dL   Glucose, Bld 878 (H) 70 - 99 mg/dL   Calcium , Ion 1.02 (L) 1.15 - 1.40 mmol/L   TCO2 22 22 - 32 mmol/L   Hemoglobin 7.5 (L) 12.0 - 15.0 g/dL   HCT 77.9 (L) 63.9 - 53.9 %  I-Stat Lactic Acid, ED     Status: Abnormal   Collection Time: 11/07/23 11:18 AM  Result Value Ref Range   Lactic Acid, Venous 3.4 (HH) 0.5 - 1.9 mmol/L   Comment NOTIFIED PHYSICIAN   Hemoglobin and hematocrit, blood     Status: Abnormal   Collection Time: 11/07/23  1:27 PM  Result Value Ref Range   Hemoglobin 11.3 (L) 12.0 - 15.0 g/dL   HCT 65.6 (L) 63.9 - 53.9 %  I-Stat Lactic Acid, ED     Status: Abnormal   Collection Time: 11/07/23  1:38 PM  Result Value Ref Range   Lactic Acid, Venous 2.6 (HH) 0.5 - 1.9 mmol/L   Comment NOTIFIED PHYSICIAN   C Difficile Quick Screen w PCR reflex     Status: None   Collection Time: 11/07/23  1:57 PM   Specimen: STOOL  Result Value Ref Range   C Diff antigen NEGATIVE NEGATIVE   C Diff toxin NEGATIVE NEGATIVE   C Diff interpretation No C. difficile detected.      CT ANGIO GI BLEED Result Date: 11/07/2023 EXAM: CTA ABDOMEN AND PELVIS WITH CONTRAST 11/07/2023 01:22:13 PM TECHNIQUE: CTA images of the abdomen and pelvis with  intravenous contrast. Three-dimensional MIP/volume rendered formations were performed. Automated exposure control, iterative reconstruction, and/or weight based adjustment of the mA/kV was utilized to reduce the radiation dose to as low as reasonably achievable. COMPARISON: CT 01/06/2023 CLINICAL HISTORY: GI bleed. FINDINGS: VASCULATURE: Extensive calcified aortoiliac atheromatous plaque without aneurysm. Calcified plaque at the origin of the right renal artery resulting in short segment stenosis of at least moderate severity, patent distally. Duplicated left renal arteries, superior dominant, both patent. GI BLEED: No evidence of active GI bleed or acute GI pathology. AORTA: No acute finding. No abdominal aortic aneurysm. No dissection. CELIAC TRUNK: No acute finding. No occlusion or significant stenosis. SUPERIOR MESEMTRIC ARTERY: No acute finding. No occlusion or significant stenosis. INFERIOR MESENTERIC ARTERY: No acute finding. No occlusion or significant stenosis. RENAL ARTERIES: Calcified plaque at the origin of the right renal artery resulting in  short segment stenosis of at least moderate severity, patent distally. Duplicated left renal arteries, superior dominant, both patent. ILIAC ARTERIES: No acute finding. Extensive calcified plaque. No occlusion or significant stenosis. ABDOMEN/PELVIS: LOWER CHEST: New small left pleural effusion with dependent consolidation/atelectasis posteriorly in the left lower lobe. There is some patchy opacities peripherally at the right lung base. Transvenous pacing leads partially visualized. LIVER: The liver is unremarkable. GALLBLADDER AND BILE DUCTS: Subcentimeter partially calcified gallstone in the dependent aspect of the nondilated gallbladder. No biliary ductal dilatation. SPLEEN: The spleen is unremarkable. PANCREAS: main duct dilatation in the pancreatic body, progressive since prior exam without discrete obstructing lesion or regional inflammatory changes. ADRENAL  GLANDS: Bilateral adrenal glands demonstrate no acute abnormality. KIDNEYS, URETERS AND BLADDER: Stable left upper pole renal cortical cyst. No stones in the kidneys or ureters. No hydronephrosis. No perinephric or periureteral stranding. Urinary bladder is unremarkable. GI AND BOWEL: Anastomotic staple lines in the right lower quadrant. Small hiatal hernia. A few small diverticula near the descending/sigmoid junction without adjacent inflammatory change. No evidence of active GI bleed or acute GI pathology. REPRODUCTIVE: Reproductive organs are unremarkable. PERITONEUM AND RETROPERITONEUM: No ascites or free air. LYMPH NODES: No lymphadenopathy. BONES AND SOFT TISSUES: Bilateral hip DJD. Multilevel lumbar spondylitic change. No acute abnormality of the bones. No acute soft tissue abnormality. IMPRESSION: 1. No evidence of active GI bleed or acute GI pathology. 2. Main duct dilatation in the pancreatic body, progressive since prior exam without discrete obstructing lesion or regional inflammatory changes. Elective MRCP or ERCP may be useful for further evaluation. 3. New small left pleural effusion with dependent consolidation/atelectasis posteriorly in the left lower lobe. Patchy opacities peripherally at the right lung base. Electronically signed by: Dayne Hassell MD 11/07/2023 01:42 PM EDT RP Workstation: [id number]    ROS:  As stated above in the HPI otherwise negative.  Blood pressure (!) 147/80, pulse (!) 102, temperature 98.5 F (36.9 C), temperature source Oral, resp. rate 18, height 5' (1.524 m), weight 61.2 kg, SpO2 100%.    PE: Gen: NAD, Alert and Oriented HEENT:  [place]/AT, EOMI Neck: Supple, no LAD Lungs: CTA Bilaterally CV: RRR without M/G/R ABD: Soft, NTND, +BS Ext: No C/C/E  Assessment/Plan: 1) Probable upper GI bleed. 2) Chronic use of prednisone , but previously H pylori negative. 3) Anemia. 4) Hypotension - resolved.   The patient is hemodynamically stable.  It is best to  continue with resuscitation and perform an EGD tomorrow.  Plan: 1) Continue with IV hydration. 2) Follow HGB and transfuse as necessary. 3) Maintain PPI. 4) EGD tomorrow AM.  ROLLIN DOVER D 11/07/2023, 5:41 PM

## 2023-11-07 NOTE — H&P (Signed)
 History and Physical    Pamela Dunn FMW:[id number] DOB: 08-04-1931 DOA: 11/07/2023  PCP: Gerome Brunet, DO  Patient coming from: Home  I have personally briefly reviewed patient's old medical records in [facility]  Chief Complaint: Rectal bleeding  HPI: Pamela Dunn is a 89 y.o. female with medical history significant of GERD, RA on prednisone , hyperlipidemia, history of complete heart block status post pacemaker, hypothyroidism, combined systolic and diastolic congestive heart failure, cardiomyopathy, peptic ulcer disease who presented to ED with the complaint of rectal bleeding.  History was provided by the patient and verified by patient's caretaker as well as son who are at the bedside.  According to the history, patient initially complained of some shortness of breath yesterday.  That was more exertional and she was also feeling weak.  She took a flight all day yesterday.  However today, she had rectal incontinence and when she checked, she found dark red blood in it.  She was brought into the emergency department.  She had no other complaints.  She currently has no complaints at all.  No further episodes of rectal bleeding.  She denies any fever, chills, sweating, abdominal pain, any problem with urination, any recent travel.  Patient only takes aspirin  81 mg, not on any other anticoagulants.  ED Course: Initially upon arrival to ED, she was hypotensive with blood pressure of 65/31.  Her hemoglobin quickly dropped from 8.0-7.5.  Patient received 500 cc of IV fluid bolus as well as 2 units of PRBC transfusion.  Patient's blood pressure improved and recent blood pressure reading is 113/78.  FOBT was positive.  GI was consulted who requested admission for possible EGD or colonoscopy.  EDP initially discussed the case with ICU but since patient was not requiring and blood pressure improved with blood transfusion, they recommended admission under hospital service.  Review of Systems: As per  HPI otherwise negative.    Past Medical History:  Diagnosis Date   Allergy    seasonal   Anemia    Arthritis    Encounter for care of pacemaker 07/12/2020   GERD (gastroesophageal reflux disease)    Heart block AV complete (HCC) 07/11/2020   Hyperlipidemia    Hypertension    Pacemaker Abbott Assurity MRI model [id number] 07/12/2020 07/12/2020    Past Surgical History:  Procedure Laterality Date   APPENDECTOMY     BACK SURGERY     BOWEL RESECTION N/A 08/24/2019   Procedure: SMALL BOWEL RESECTION;  Surgeon: Vernetta Berg, MD;  Location: [facility];  Service: General;  Laterality: N/A;   LAPAROSCOPY N/A 08/24/2019   Procedure: LAPAROSCOPY DIAGNOSTIC;  Surgeon: Vernetta Berg, MD;  Location: [facility];  Service: General;  Laterality: N/A;   LAPAROTOMY N/A 08/24/2019   Procedure: EXPLORATORY LAPAROTOMY;  Surgeon: Vernetta Berg, MD;  Location: [facility];  Service: General;  Laterality: N/A;   PACEMAKER IMPLANT N/A 07/12/2020   Procedure: PACEMAKER IMPLANT;  Surgeon: Kelsie Agent, MD;  Location: [facility];  Service: Cardiovascular;  Laterality: N/A;     reports that she has never smoked. She has never used smokeless tobacco. She reports that she does not drink alcohol  and does not use drugs.  Allergies  Allergen Reactions   Coreg  [Carvedilol ] Diarrhea   Lorazepam Diarrhea    Family History  Problem Relation Age of Onset   Cancer Mother    Cancer - Colon Sister    Heart disease Sister    Brain cancer Brother    Cancer -  Colon Brother    Cancer - Colon Brother    Breast cancer Maternal Aunt     Prior to Admission medications   Medication Sig Start Date End Date Taking? Authorizing Provider  cholecalciferol  (VITAMIN D ) 1000 UNITS tablet Take 1,000 Units by mouth daily.    Yes [provider]  ezetimibe  (ZETIA ) 10 MG tablet Take 10 mg by mouth daily.    Yes [provider]  folic acid  (FOLVITE ) 1 MG tablet Take 1 mg by mouth 2 (two) times daily. 02/09/23  Yes  [provider]  furosemide  (LASIX ) 20 MG tablet TAKE 1 TABLET BY MOUTH EVERY DAY AS NEEDED 07/14/23  Yes Dick, Ernest H Jr., NP  guaiFENesin (MUCINEX) 600 MG 12 hr tablet Take 600 mg by mouth 2 (two) times daily as needed for cough or to loosen phlegm.   Yes [provider]  isosorbide -hydrALAZINE  (BIDIL ) 20-37.5 MG tablet Take 1 tablet by mouth 3 (three) times daily. 05/01/23  Yes Ladona Heinz, MD  levothyroxine  (SYNTHROID ) 25 MCG tablet Take 25 mcg by mouth daily.    Yes [provider]  metoprolol  succinate (TOPROL -XL) 25 MG 24 hr tablet Take 1 tablet (25 mg total) by mouth daily. 04/07/23  Yes Ladona Heinz, MD  Multiple Vitamins-Minerals (PRESERVISION AREDS PO) Take 1 Dose by mouth 2 (two) times daily.   Yes [provider]  Omega-3 Fatty Acids (OMEGA-3 FISH OIL PO) Take 1 capsule by mouth in the morning and at bedtime.   Yes [provider]  polyethylene glycol (MIRALAX  / GLYCOLAX ) 17 g packet Take 17 g by mouth daily.   Yes [provider]  predniSONE  (DELTASONE ) 5 MG tablet Take 5 mg by mouth daily. 11/03/18  Yes [provider]    Physical Exam: Vitals:   11/07/23 1330 11/07/23 1345 11/07/23 1400 11/07/23 1415  BP: (!) 162/104 (!) 152/95 139/77 113/78  Pulse: 96 100    Resp: 18 (!) 24 (!) 21 19  Temp:      TempSrc:      SpO2: 97% 96%      Constitutional: NAD, calm, comfortable Vitals:   11/07/23 1330 11/07/23 1345 11/07/23 1400 11/07/23 1415  BP: (!) 162/104 (!) 152/95 139/77 113/78  Pulse: 96 100    Resp: 18 (!) 24 (!) 21 19  Temp:      TempSrc:      SpO2: 97% 96%     Eyes: PERRL, lids and conjunctivae normal ENMT: Mucous membranes are moist. Posterior pharynx clear of any exudate or lesions.Normal dentition.  Neck: normal, supple, no masses, no thyromegaly Respiratory: clear to auscultation bilaterally, no wheezing, no crackles. Normal respiratory effort. No accessory muscle use.  Cardiovascular: Regular rate and  rhythm, no murmurs / rubs / gallops. No extremity edema. 2+ pedal pulses. No carotid bruits.  Abdomen: no tenderness, no masses palpated. No hepatosplenomegaly. Bowel sounds positive.  Musculoskeletal: no clubbing / cyanosis. No joint deformity upper and lower extremities. Good ROM, no contractures. Normal muscle tone.  Skin: no rashes, lesions, ulcers. No induration Neurologic: CN 2-12 grossly intact. Sensation intact, DTR normal. Strength 5/5 in all 4.  Psychiatric: Normal judgment and insight. Alert and oriented x 3. Normal mood.    Labs on Admission: I have personally reviewed following labs and imaging studies  CBC: Recent Labs  Lab 11/07/23 1052 11/07/23 1118 11/07/23 1327  WBC 18.0*  --   --   NEUTROABS 12.5*  --   --   HGB 8.0* 7.5* 11.3*  HCT 25.9* 22.0* 34.3*  MCV 103.6*  --   --   PLT 217  --   --    Basic Metabolic Panel: Recent Labs  Lab 11/07/23 1052 11/07/23 1118  NA 133* 133*  K 4.7 4.6  CL 100 102  CO2 20*  --   GLUCOSE 121* 121*  BUN 48* 45*  CREATININE 0.97 1.00  CALCIUM  8.0*  --    GFR: CrCl cannot be calculated (Unknown ideal weight.). Liver Function Tests: Recent Labs  Lab 11/07/23 1052  AST 22  ALT 16  ALKPHOS 44  BILITOT 0.5  PROT 4.6*  ALBUMIN 1.9*   No results for input(s): LIPASE, AMYLASE in the last 168 hours. No results for input(s): AMMONIA in the last 168 hours. Coagulation Profile: Recent Labs  Lab 11/07/23 1052  INR 1.1   Cardiac Enzymes: No results for input(s): CKTOTAL, CKMB, CKMBINDEX, TROPONINI in the last 168 hours. BNP (last 3 results) No results for input(s): PROBNP in the last 8760 hours. HbA1C: No results for input(s): HGBA1C in the last 72 hours. CBG: No results for input(s): GLUCAP in the last 168 hours. Lipid Profile: No results for input(s): CHOL, HDL, LDLCALC, TRIG, CHOLHDL, LDLDIRECT in the last 72 hours. Thyroid  Function Tests: No results for input(s): TSH,  T4TOTAL, FREET4, T3FREE, THYROIDAB in the last 72 hours. Anemia Panel: No results for input(s): VITAMINB12, FOLATE, FERRITIN, TIBC, IRON, RETICCTPCT in the last 72 hours. Urine analysis:    Component Value Date/Time   COLORURINE YELLOW 01/06/2023 2209   APPEARANCEUR CLEAR 01/06/2023 2209   LABSPEC 1.013 01/06/2023 2209   PHURINE 6.0 01/06/2023 2209   GLUCOSEU NEGATIVE 01/06/2023 2209   HGBUR NEGATIVE 01/06/2023 2209   BILIRUBINUR NEGATIVE 01/06/2023 2209   KETONESUR NEGATIVE 01/06/2023 2209   PROTEINUR NEGATIVE 01/06/2023 2209   NITRITE NEGATIVE 01/06/2023 2209   LEUKOCYTESUR NEGATIVE 01/06/2023 2209    Radiological Exams on Admission: CT ANGIO GI BLEED Result Date: 11/07/2023 EXAM: CTA ABDOMEN AND PELVIS WITH CONTRAST 11/07/2023 01:22:13 PM TECHNIQUE: CTA images of the abdomen and pelvis with intravenous contrast. Three-dimensional MIP/volume rendered formations were performed. Automated exposure control, iterative reconstruction, and/or weight based adjustment of the mA/kV was utilized to reduce the radiation dose to as low as reasonably achievable. COMPARISON: CT 01/06/2023 CLINICAL HISTORY: GI bleed. FINDINGS: VASCULATURE: Extensive calcified aortoiliac atheromatous plaque without aneurysm. Calcified plaque at the origin of the right renal artery resulting in short segment stenosis of at least moderate severity, patent distally. Duplicated left renal arteries, superior dominant, both patent. GI BLEED: No evidence of active GI bleed or acute GI pathology. AORTA: No acute finding. No abdominal aortic aneurysm. No dissection. CELIAC TRUNK: No acute finding. No occlusion or significant stenosis. SUPERIOR MESEMTRIC ARTERY: No acute finding. No occlusion or significant stenosis. INFERIOR MESENTERIC ARTERY: No acute finding. No occlusion or significant stenosis. RENAL ARTERIES: Calcified plaque at the origin of the right renal artery resulting in short segment stenosis of at  least moderate severity, patent distally. Duplicated left renal arteries, superior dominant, both patent. ILIAC ARTERIES: No acute finding. Extensive calcified plaque. No occlusion or significant stenosis. ABDOMEN/PELVIS: LOWER CHEST: New small left pleural effusion with dependent consolidation/atelectasis posteriorly in the left lower lobe. There is some patchy opacities peripherally at the right lung base. Transvenous pacing leads partially visualized. LIVER: The liver is unremarkable. GALLBLADDER AND BILE DUCTS: Subcentimeter partially calcified gallstone in the dependent aspect of the nondilated gallbladder. No biliary ductal dilatation. SPLEEN: The spleen is unremarkable. PANCREAS: main duct dilatation  in the pancreatic body, progressive since prior exam without discrete obstructing lesion or regional inflammatory changes. ADRENAL GLANDS: Bilateral adrenal glands demonstrate no acute abnormality. KIDNEYS, URETERS AND BLADDER: Stable left upper pole renal cortical cyst. No stones in the kidneys or ureters. No hydronephrosis. No perinephric or periureteral stranding. Urinary bladder is unremarkable. GI AND BOWEL: Anastomotic staple lines in the right lower quadrant. Small hiatal hernia. A few small diverticula near the descending/sigmoid junction without adjacent inflammatory change. No evidence of active GI bleed or acute GI pathology. REPRODUCTIVE: Reproductive organs are unremarkable. PERITONEUM AND RETROPERITONEUM: No ascites or free air. LYMPH NODES: No lymphadenopathy. BONES AND SOFT TISSUES: Bilateral hip DJD. Multilevel lumbar spondylitic change. No acute abnormality of the bones. No acute soft tissue abnormality. IMPRESSION: 1. No evidence of active GI bleed or acute GI pathology. 2. Main duct dilatation in the pancreatic body, progressive since prior exam without discrete obstructing lesion or regional inflammatory changes. Elective MRCP or ERCP may be useful for further evaluation. 3. New small left  pleural effusion with dependent consolidation/atelectasis posteriorly in the left lower lobe. Patchy opacities peripherally at the right lung base. Electronically signed by: Katheleen Faes MD 11/07/2023 01:42 PM EDT RP Workstation: [id number]    Assessment/Plan Principal Problem:   GIB (gastrointestinal bleeding)   Acute blood loss anemia presumably secondary to lower GI bleed: Based on history, this seems to be lower GI bleed.  Hemoglobin as low as 7.5, status post 2 units of PRBC transfusion, posttransfusion hemoglobin is 11.3.  Unsure if there is any component of upper GI bleed, will start on Protonix  twice daily.  Monitor H&H every 12 hours and transfuse if less than 7.5.  GI will see patient.  Lactic acidosis: Lactic acid was 3.4 initially, repeat is 2.6 after getting some fluids.  Will repeat in few hours again and hope that it might have improved.  History of hypertension but presented with hypotension: Initial blood pressure 65/31.  Currently 113/78 after half liter of fluid bolus and blood transfusion.  Will hold BiDil  and Toprol -XL due to risk of hypotension.  Mild hyponatremia: 133.  She is asymptomatic.  Chronic systolic and diastolic congestive heart failure: Appears euvolemic.  Hold diuretics for now.  Hypothyroidism: Resume Synthroid .  Hyperlipidemia: Resume medications.  DVT prophylaxis: SCD Code Status: Full code per patient's wish Family Communication: Son and caretaker present at bedside.  Plan of care discussed with patient in length and he verbalized understanding and agreed with it. Disposition Plan: May discharge in 1 to 2 days. Consults called: GI  Fredia Skeeter MD [facility]  *Please note that this is a verbal dictation therefore any spelling or grammatical errors are due to the Dragon Medical One system interpretation.  Please page via Amion and do not message via secure chat for urgent patient care matters. Secure chat can be used for non urgent  patient care matters. 11/07/2023, 2:30 PM  To contact the attending provider between 7A-7P or the covering provider during after hours 7P-7A, please log into the web site [email or web address]

## 2023-11-07 NOTE — Progress Notes (Signed)
 SCD removed per patent request.

## 2023-11-07 NOTE — Plan of Care (Signed)
  Problem: Education: Goal: Knowledge of General Education information will improve Description: Including pain rating scale, medication(s)/side effects and non-pharmacologic comfort measures Outcome: Progressing   Problem: Clinical Measurements: Goal: Respiratory complications will improve Outcome: Progressing   Problem: Clinical Measurements: Goal: Cardiovascular complication will be avoided Outcome: Progressing   Problem: Nutrition: Goal: Adequate nutrition will be maintained Outcome: Progressing   Problem: Coping: Goal: Level of anxiety will decrease Outcome: Progressing   Problem: Pain Managment: Goal: General experience of comfort will improve and/or be controlled Outcome: Progressing   Problem: Safety: Goal: Ability to remain free from injury will improve Outcome: Progressing   Problem: Skin Integrity: Goal: Risk for impaired skin integrity will decrease Outcome: Progressing

## 2023-11-07 NOTE — ED Notes (Signed)
 Oxisensor still with poo signal/ erroneous numbers. NAD, calm, interactive. Denies sob. LS CTA. Resps e/u, speaking in clear complete sentences.

## 2023-11-07 NOTE — Plan of Care (Signed)

## 2023-11-07 NOTE — ED Notes (Signed)
 EDP into see

## 2023-11-07 NOTE — Progress Notes (Signed)
 SCD placed per order.

## 2023-11-08 ENCOUNTER — Observation Stay (HOSPITAL_COMMUNITY)

## 2023-11-08 ENCOUNTER — Encounter (HOSPITAL_COMMUNITY)
Admission: EM | Disposition: A | Discharge status: Home Health | Payer: Self-pay | Source: Home / Self Care | Attending: Emergency Medicine

## 2023-11-08 ENCOUNTER — Encounter (HOSPITAL_COMMUNITY): Payer: Self-pay | Admitting: Family Medicine

## 2023-11-08 DIAGNOSIS — I959 Hypotension, unspecified: Secondary | ICD-10-CM | POA: Diagnosis not present

## 2023-11-08 DIAGNOSIS — K254 Chronic or unspecified gastric ulcer with hemorrhage: Secondary | ICD-10-CM | POA: Diagnosis not present

## 2023-11-08 DIAGNOSIS — D509 Iron deficiency anemia, unspecified: Secondary | ICD-10-CM | POA: Diagnosis not present

## 2023-11-08 DIAGNOSIS — K921 Melena: Secondary | ICD-10-CM

## 2023-11-08 DIAGNOSIS — I5032 Chronic diastolic (congestive) heart failure: Secondary | ICD-10-CM

## 2023-11-08 DIAGNOSIS — R1084 Generalized abdominal pain: Secondary | ICD-10-CM | POA: Diagnosis not present

## 2023-11-08 DIAGNOSIS — E039 Hypothyroidism, unspecified: Secondary | ICD-10-CM | POA: Diagnosis not present

## 2023-11-08 DIAGNOSIS — K449 Diaphragmatic hernia without obstruction or gangrene: Secondary | ICD-10-CM | POA: Diagnosis not present

## 2023-11-08 DIAGNOSIS — K259 Gastric ulcer, unspecified as acute or chronic, without hemorrhage or perforation: Secondary | ICD-10-CM | POA: Diagnosis not present

## 2023-11-08 DIAGNOSIS — K625 Hemorrhage of anus and rectum: Secondary | ICD-10-CM | POA: Diagnosis not present

## 2023-11-08 DIAGNOSIS — I110 Hypertensive heart disease with heart failure: Secondary | ICD-10-CM | POA: Diagnosis not present

## 2023-11-08 DIAGNOSIS — K3189 Other diseases of stomach and duodenum: Secondary | ICD-10-CM | POA: Diagnosis not present

## 2023-11-08 HISTORY — PX: ESOPHAGOGASTRODUODENOSCOPY: SHX5428

## 2023-11-08 LAB — CBC WITH DIFFERENTIAL/PLATELET
Abs Immature Granulocytes: 0.63 K/uL — ABNORMAL HIGH (ref 0.00–0.07)
Basophils Absolute: 0.1 K/uL (ref 0.0–0.1)
Basophils Relative: 1 %
Eosinophils Absolute: 0 K/uL (ref 0.0–0.5)
Eosinophils Relative: 0 %
HCT: 32.6 % — ABNORMAL LOW (ref 36.0–46.0)
Hemoglobin: 11.1 g/dL — ABNORMAL LOW (ref 12.0–15.0)
Immature Granulocytes: 5 %
Lymphocytes Relative: 5 %
Lymphs Abs: 0.7 K/uL (ref 0.7–4.0)
MCH: 31.4 pg (ref 26.0–34.0)
MCHC: 34 g/dL (ref 30.0–36.0)
MCV: 92.1 fL (ref 80.0–100.0)
Monocytes Absolute: 0.8 K/uL (ref 0.1–1.0)
Monocytes Relative: 6 %
Neutro Abs: 11 K/uL — ABNORMAL HIGH (ref 1.7–7.7)
Neutrophils Relative %: 83 %
Platelets: 192 K/uL (ref 150–400)
RBC: 3.54 MIL/uL — ABNORMAL LOW (ref 3.87–5.11)
RDW: 17.2 % — ABNORMAL HIGH (ref 11.5–15.5)
WBC: 13.2 K/uL — ABNORMAL HIGH (ref 4.0–10.5)
nRBC: 0.5 % — ABNORMAL HIGH (ref 0.0–0.2)

## 2023-11-08 LAB — COMPREHENSIVE METABOLIC PANEL WITH GFR
ALT: 11 U/L (ref 0–44)
AST: 23 U/L (ref 15–41)
Albumin: 1.7 g/dL — ABNORMAL LOW (ref 3.5–5.0)
Alkaline Phosphatase: 43 U/L (ref 38–126)
Anion gap: 11 (ref 5–15)
BUN: 32 mg/dL — ABNORMAL HIGH (ref 8–23)
CO2: 18 mmol/L — ABNORMAL LOW (ref 22–32)
Calcium: 7.9 mg/dL — ABNORMAL LOW (ref 8.9–10.3)
Chloride: 106 mmol/L (ref 98–111)
Creatinine, Ser: 0.82 mg/dL (ref 0.44–1.00)
GFR, Estimated: >60 mL/min (ref >60)
Glucose, Bld: 76 mg/dL (ref 70–99)
Potassium: 4.9 mmol/L (ref 3.5–5.1)
Sodium: 135 mmol/L (ref 135–145)
Total Bilirubin: 1.2 mg/dL (ref 0.0–1.2)
Total Protein: 4.2 g/dL — ABNORMAL LOW (ref 6.5–8.1)

## 2023-11-08 LAB — LACTIC ACID, PLASMA: Lactic Acid, Venous: 1.6 mmol/L (ref 0.5–1.9)

## 2023-11-08 SURGERY — EGD (ESOPHAGOGASTRODUODENOSCOPY)
Anesthesia: Monitor Anesthesia Care

## 2023-11-08 MED ORDER — PROPOFOL 500 MG/50ML IV EMUL
INTRAVENOUS | Status: DC | PRN
  Administered 2023-11-08: 75 ug/kg/min via INTRAVENOUS

## 2023-11-08 MED ORDER — LIDOCAINE HCL (CARDIAC) PF 100 MG/5ML IV SOSY
PREFILLED_SYRINGE | INTRAVENOUS | Status: DC | PRN
  Administered 2023-11-08: 60 mg via INTRAVENOUS

## 2023-11-08 MED ORDER — HYDRALAZINE HCL 20 MG/ML IJ SOLN
10.0000 mg | Freq: Four times a day (QID) | INTRAMUSCULAR | Status: DC | PRN
  Administered 2023-11-08: 10 mg via INTRAVENOUS
  Filled 2023-11-08: qty 1

## 2023-11-08 MED ORDER — PROPOFOL 10 MG/ML IV BOLUS
INTRAVENOUS | Status: DC | PRN
  Administered 2023-11-08 (×2): 20 mg via INTRAVENOUS

## 2023-11-08 NOTE — Op Note (Signed)
 [facility] Patient Name: Pamela Dunn Procedure Date : 11/08/2023 MRN: [id number] Attending MD: Belvie Just , MD, [id number] Date of Birth: [birth year] CSN: [id number] Age: 89 Admit Type: Inpatient Procedure:                Upper GI endoscopy Indications:              Melena Providers:                Belvie Just, MD, Collene Edu, RN, [facility] [place],                            Technician, Donald Hasty Referring MD:              Medicines:                Propofol  per Anesthesia Complications:            No immediate complications. Estimated Blood Loss:     Estimated blood loss: none. Procedure:                Pre-Anesthesia Assessment:                           - Prior to the procedure, a History and Physical                            was performed, and patient medications and                            allergies were reviewed. The patient's tolerance of                            previous anesthesia was also reviewed. The risks                            and benefits of the procedure and the sedation                            options and risks were discussed with the patient.                            All questions were answered, and informed consent                            was obtained. Prior Anticoagulants: The patient has                            taken no anticoagulant or antiplatelet agents. ASA                            Grade Assessment: III - A patient with severe                            systemic disease. After reviewing the risks and  benefits, the patient was deemed in satisfactory                            condition to undergo the procedure.                           - Sedation was administered by an anesthesia                            professional. Deep sedation was attained.                           After obtaining informed consent, the endoscope was                            passed under direct vision. Throughout  the                            procedure, the patient's blood pressure, pulse, and                            oxygen saturations were monitored continuously. The                            [id number]-[id number] ([id number]) Olympus endoscope was introduced                            through the mouth, and advanced to the second part                            of duodenum. The upper GI endoscopy was                            accomplished without difficulty. The patient                            tolerated the procedure well. Scope In: Scope Out: Findings:      A non-obstructing Schatzki ring was found at the gastroesophageal       junction.      A 3 cm hiatal hernia was present.      Few non-bleeding superficial gastric ulcers with a clean ulcer base       (Forrest Class III) were found in the gastric body and in the gastric       antrum. The largest lesion was 2 mm in largest dimension. Biopsies were       taken with a cold forceps for Helicobacter pylori testing.      Two non-bleeding superficial duodenal ulcers with a clean ulcer base       (Forrest Class III) were found in the duodenal bulb. The largest lesion       was 3 mm in largest dimension. Impression:               - 3 cm hiatal hernia.                           -  Non-bleeding gastric ulcers with a clean ulcer                            base (Forrest Class III). Biopsied.                           - Non-bleeding duodenal ulcers with a clean ulcer                            base (Forrest Class III). Recommendation:           - Return patient to hospital ward for ongoing care.                           - Resume regular diet.                           - Continue present medications.                           - Await pathology results.                           - PPI QD.                           - If negative for H. pylori she will need a PPI QD                            indefinitely.                           - Follow up with [facility] in  2-4 weeks.                           - Signing off. Procedure Code(s):        --- Professional ---                           [phone], Esophagogastroduodenoscopy, flexible,                            transoral; with biopsy, single or multiple Diagnosis Code(s):        --- Professional ---                           K25.9, Gastric ulcer, unspecified as acute or                            chronic, without hemorrhage or perforation                           K44.9, Diaphragmatic hernia without obstruction or                            gangrene  K26.9, Duodenal ulcer, unspecified as acute or                            chronic, without hemorrhage or perforation                           K92.1, Melena (includes Hematochezia) CPT copyright 2022 American Medical Association. All rights reserved. The codes documented in this report are preliminary and upon coder review may  be revised to meet current compliance requirements. Belvie Just, MD Belvie Just, MD 11/08/2023 4:38:04 PM This report has been signed electronically. Number of Addenda: 0

## 2023-11-08 NOTE — Plan of Care (Signed)

## 2023-11-08 NOTE — Interval H&P Note (Signed)
 History and Physical Interval Note:  11/08/2023 4:35 PM  Pamela Dunn  has presented today for surgery, with the diagnosis of GI bleed.  The various methods of treatment have been discussed with the patient and family. After consideration of risks, benefits and other options for treatment, the patient has consented to  Procedure(s): EGD (ESOPHAGOGASTRODUODENOSCOPY) (N/A) as a surgical intervention.  The patient's history has been reviewed, patient examined, no change in status, stable for surgery.  I have reviewed the patient's chart and labs.  Questions were answered to the patient's satisfaction.     Detrich Rakestraw D

## 2023-11-08 NOTE — Anesthesia Postprocedure Evaluation (Signed)
 Anesthesia Post Note  Patient: Pamela Dunn  Procedure(s) Performed: EGD (ESOPHAGOGASTRODUODENOSCOPY)     Patient location during evaluation: PACU Anesthesia Type: MAC Level of consciousness: awake and alert Pain management: pain level controlled Vital Signs Assessment: post-procedure vital signs reviewed and stable Respiratory status: spontaneous breathing, nonlabored ventilation, respiratory function stable and patient connected to nasal cannula oxygen Cardiovascular status: stable and blood pressure returned to baseline Postop Assessment: no apparent nausea or vomiting Anesthetic complications: no   No notable events documented.  Last Vitals:  Vitals:   11/08/23 1645 11/08/23 1700  BP: (!) 144/81 134/79  Pulse: 87 87  Resp: (!) 23 20  Temp:  36.9 C  SpO2: 95% 95%    Last Pain:  Vitals:   11/08/23 1700  TempSrc:   PainSc: 0-No pain                 Thom JONELLE Peoples

## 2023-11-08 NOTE — Interval H&P Note (Signed)
 History and Physical Interval Note:  11/08/2023 4:01 PM  Pamela Dunn  has presented today for surgery, with the diagnosis of GI bleed.  The various methods of treatment have been discussed with the patient and family. After consideration of risks, benefits and other options for treatment, the patient has consented to  Procedure(s): EGD (ESOPHAGOGASTRODUODENOSCOPY) (N/A) as a surgical intervention.  The patient's history has been reviewed, patient examined, no change in status, stable for surgery.  I have reviewed the patient's chart and labs.  Questions were answered to the patient's satisfaction.     Zoya Sprecher D

## 2023-11-08 NOTE — Anesthesia Preprocedure Evaluation (Signed)
 Anesthesia Evaluation  Patient identified by MRN, date of birth, ID band Patient awake    Reviewed: Allergy & Precautions, H&P , NPO status , Patient's Chart, lab work & pertinent test results  History of Anesthesia Complications Negative for: history of anesthetic complications  Airway Mallampati: II  TM Distance: >3 FB Neck ROM: Full    Dental no notable dental hx.    Pulmonary neg pulmonary ROS   Pulmonary exam normal breath sounds clear to auscultation       Cardiovascular hypertension, +CHF  Normal cardiovascular exam+ dysrhythmias + pacemaker  Rhythm:Regular Rate:Normal  Echocardiogram 06/10/2021:  Left ventricle cavity is normal in size. Mild concentric hypertrophy of  the left ventricle. Abnormal septal wall motion due to left bundle branch  block. Normal LV systolic function with EF 57%. Doppler evidence of grade  I (impaired) diastolic dysfunction, normal LAP.  Moderate tricuspid regurgitation. Estimated pulmonary artery systolic  pressure 27 mmHg.  Unlike previous study in 2019m mitral regurgitation not appreciated. No  other significant change noted.     Neuro/Psych negative neurological ROS  negative psych ROS   GI/Hepatic Neg liver ROS,,,GI bleed. S/p 2 pRBC   Endo/Other  Hypothyroidism    Renal/GU negative Renal ROS  negative genitourinary   Musculoskeletal negative musculoskeletal ROS (+)    Abdominal   Peds negative pediatric ROS (+)  Hematology  (+) Blood dyscrasia, anemia   Anesthesia Other Findings   Reproductive/Obstetrics negative OB ROS                              Anesthesia Physical Anesthesia Plan  ASA: 3  Anesthesia Plan: MAC   Post-op Pain Management:    Induction: Intravenous  PONV Risk Score and Plan: 2 and Propofol  infusion and Treatment may vary due to age or medical condition  Airway Management Planned: Natural Airway  Additional  Equipment:   Intra-op Plan:   Post-operative Plan:   Informed Consent: I have reviewed the patients History and Physical, chart, labs and discussed the procedure including the risks, benefits and alternatives for the proposed anesthesia with the patient or authorized representative who has indicated his/her understanding and acceptance.     Dental advisory given  Plan Discussed with: CRNA  Anesthesia Plan Comments: Pamela Dunn is a 89 y.o. female with medical history significant of GERD, RA on prednisone , hyperlipidemia, history of complete heart block status post pacemaker, hypothyroidism, combined systolic and diastolic congestive heart failure, cardiomyopathy, peptic ulcer disease who presented to ED with the complaint of rectal bleeding. )         Anesthesia Quick Evaluation

## 2023-11-08 NOTE — TOC Progression Note (Signed)
 Transition of Care [facility]) - Progression Note    Patient Details  Name: Pamela Dunn MRN: [id number] Date of Birth: [birth year]  Transition of Care [facility]) CM/SW Contact  Marval Gell, RN Phone Number: 11/08/2023, 3:36 PM  Clinical Narrative:     Patient off unit unable to do MOON                    Expected Discharge Plan and Services                                               Social Drivers of Health (SDOH) Interventions SDOH Screenings   Food Insecurity: No Food Insecurity (11/07/2023)  Housing: Low Risk  (11/07/2023)  Transportation Needs: No Transportation Needs (11/07/2023)  Utilities: Not At Risk (11/07/2023)  Social Connections: Moderately Isolated (11/07/2023)  Tobacco Use: Low Risk  (11/07/2023)    Readmission Risk Interventions     No data to display

## 2023-11-08 NOTE — Transfer of Care (Signed)
 Immediate Anesthesia Transfer of Care Note  Patient: Pamela Dunn  Procedure(s) Performed: EGD (ESOPHAGOGASTRODUODENOSCOPY)  Patient Location: PACU  Anesthesia Type:MAC  Level of Consciousness: awake, alert , oriented, drowsy, and patient cooperative  Airway & Oxygen Therapy: Patient Spontanous Breathing  Post-op Assessment: Report given to RN and Post -op Vital signs reviewed and stable  Post vital signs: Reviewed and stable  Last Vitals:  Vitals Value Taken Time  BP 104/62 1636  Temp    Pulse 96 11/08/23 16:36  Resp 22 11/08/23 16:36  SpO2 98 % 11/08/23 16:36  Vitals shown include unfiled device data.  Last Pain:  Vitals:   11/08/23 1545  TempSrc: Temporal  PainSc: 0-No pain         Complications: No notable events documented.

## 2023-11-08 NOTE — Progress Notes (Signed)
 PROGRESS NOTE    DAMIEN CISAR  FMW:[id number] DOB: 02/15/1932 DOA: 11/07/2023 PCP: Gerome Brunet, DO   Brief Narrative:  HPI: Pamela Dunn is a 89 y.o. female with medical history significant of GERD, RA on prednisone , hyperlipidemia, history of complete heart block status post pacemaker, hypothyroidism, combined systolic and diastolic congestive heart failure, cardiomyopathy, peptic ulcer disease who presented to ED with the complaint of rectal bleeding.  History was provided by the patient and verified by patient's caretaker as well as son who are at the bedside.  According to the history, patient initially complained of some shortness of breath yesterday.  That was more exertional and she was also feeling weak.  She took a flight all day yesterday.  However today, she had rectal incontinence and when she checked, she found dark red blood in it.  She was brought into the emergency department.  She had no other complaints.  She currently has no complaints at all.  No further episodes of rectal bleeding.  She denies any fever, chills, sweating, abdominal pain, any problem with urination, any recent travel.  Patient only takes aspirin  81 mg, not on any other anticoagulants.   ED Course: Initially upon arrival to ED, she was hypotensive with blood pressure of 65/31.  Her hemoglobin quickly dropped from 8.0-7.5.  Patient received 500 cc of IV fluid bolus as well as 2 units of PRBC transfusion.  Patient's blood pressure improved and recent blood pressure reading is 113/78.  FOBT was positive.  GI was consulted who requested admission for possible EGD or colonoscopy.  EDP initially discussed the case with ICU but since patient was not requiring and blood pressure improved with blood transfusion, they recommended admission under hospital service.  Assessment & Plan:   Principal Problem:   GIB (gastrointestinal bleeding)  Acute blood loss anemia either secondary to lower GI or upper GI bleed: Based on  history, this seems to be lower GI bleed.  However she told the GI doctor that there was some melanotic stool as well.  Hemoglobin as low as 7.5 upon presentation, status post 2 units of PRBC transfusion, posttransfusion hemoglobin is 11.3.  Seen by GI, plan for EGD today.  Continue Protonix .   Lactic acidosis: Lactic acid was 3.4 initially, repeat is 2.6 after getting some fluids.  Likely in the setting of GI bleed.  Repeat lactic acid today.     History of hypertension but presented with hypotension: Initial blood pressure 65/31.  Pressure still within normal range for most part.  Will hold BiDil  and Toprol -XL due to risk of hypotension.   Mild hyponatremia: Resolved   Chronic systolic and diastolic congestive heart failure: Appears euvolemic.  Hold diuretics for now.   Hypothyroidism: Resume Synthroid .   Hyperlipidemia: Resume medications.  DVT prophylaxis: SCDs Start: 11/07/23 1441   Code Status: Full Code  Family Communication:  None present at bedside.  Plan of care discussed with patient in length and he/she verbalized understanding and agreed with it.  Status is: Observation The patient will require care spanning > 2 midnights and should be moved to inpatient because: Scheduled for EGD today.  Will need monitoring for another night as well.   Estimated body mass index is 26.35 kg/m as calculated from the following:   Height as of this encounter: 5' (1.524 m).   Weight as of this encounter: 61.2 kg.    Nutritional Assessment: Body mass index is 26.35 kg/m.SABRA Seen by dietician.  I agree with the assessment and  plan as outlined below: Nutrition Status:        . Skin Assessment: I have examined the patient's skin and I agree with the wound assessment as performed by the wound care RN as outlined below:    Consultants:  GI  Procedures:  As above  Antimicrobials:  Anti-infectives (From admission, onward)    None         Subjective: Seen and examined, she is  feeling well, no complaints at all.  She has not had any bowel movement since admission.  Objective: Vitals:   11/07/23 1400 11/07/23 1415 11/07/23 1545 11/07/23 1606  BP: 139/77 113/78 (!) 147/80   Pulse:   (!) 102   Resp: (!) 21 19 18    Temp:   98.5 F (36.9 C)   TempSrc:   Oral   SpO2:   100%   Weight:    61.2 kg  Height:    5' (1.524 m)    Intake/Output Summary (Last 24 hours) at 11/08/2023 0737 Last data filed at 11/07/2023 1310 Gross per 24 hour  Intake 1180 ml  Output --  Net 1180 ml   Filed Weights   11/07/23 1606  Weight: 61.2 kg    Examination:  General exam: Appears calm and comfortable  Respiratory system: Clear to auscultation. Respiratory effort normal. Cardiovascular system: S1 & S2 heard, RRR. No JVD, murmurs, rubs, gallops or clicks. No pedal edema. Gastrointestinal system: Abdomen is nondistended, soft and nontender. No organomegaly or masses felt. Normal bowel sounds heard. Central nervous system: Alert and oriented. No focal neurological deficits. Extremities: Symmetric 5 x 5 power. Skin: No rashes, lesions or ulcers Psychiatry: Judgement and insight appear normal. Mood & affect appropriate.    Data Reviewed: I have personally reviewed following labs and imaging studies  CBC: Recent Labs  Lab 11/07/23 1052 11/07/23 1118 11/07/23 1327 11/07/23 2020  WBC 18.0*  --   --  15.0*  NEUTROABS 12.5*  --   --   --   HGB 8.0* 7.5* 11.3* 11.1*  HCT 25.9* 22.0* 34.3* 32.0*  MCV 103.6*  --   --  89.9  PLT 217  --   --  159   Basic Metabolic Panel: Recent Labs  Lab 11/07/23 1052 11/07/23 1118 11/08/23 0534  NA 133* 133* 135  K 4.7 4.6 4.9  CL 100 102 106  CO2 20*  --  18*  GLUCOSE 121* 121* 76  BUN 48* 45* 32*  CREATININE 0.97 1.00 0.82  CALCIUM  8.0*  --  7.9*   GFR: Estimated Creatinine Clearance: 37.3 mL/min (by C-G formula based on SCr of 0.82 mg/dL). Liver Function Tests: Recent Labs  Lab 11/07/23 1052 11/08/23 0534  AST 22 23  ALT  16 11  ALKPHOS 44 43  BILITOT 0.5 1.2  PROT 4.6* 4.2*  ALBUMIN 1.9* 1.7*   No results for input(s): LIPASE, AMYLASE in the last 168 hours. No results for input(s): AMMONIA in the last 168 hours. Coagulation Profile: Recent Labs  Lab 11/07/23 1052  INR 1.1   Cardiac Enzymes: No results for input(s): CKTOTAL, CKMB, CKMBINDEX, TROPONINI in the last 168 hours. BNP (last 3 results) No results for input(s): PROBNP in the last 8760 hours. HbA1C: No results for input(s): HGBA1C in the last 72 hours. CBG: No results for input(s): GLUCAP in the last 168 hours. Lipid Profile: No results for input(s): CHOL, HDL, LDLCALC, TRIG, CHOLHDL, LDLDIRECT in the last 72 hours. Thyroid  Function Tests: No results for input(s): TSH, T4TOTAL, FREET4,  T3FREE, THYROIDAB in the last 72 hours. Anemia Panel: No results for input(s): VITAMINB12, FOLATE, FERRITIN, TIBC, IRON, RETICCTPCT in the last 72 hours. Sepsis Labs: Recent Labs  Lab 11/07/23 1118 11/07/23 1338  LATICACIDVEN 3.4* 2.6*    Recent Results (from the past 240 hours)  C Difficile Quick Screen w PCR reflex     Status: None   Collection Time: 11/07/23  1:57 PM   Specimen: STOOL  Result Value Ref Range Status   C Diff antigen NEGATIVE NEGATIVE Final   C Diff toxin NEGATIVE NEGATIVE Final   C Diff interpretation No C. difficile detected.  Final    Comment: Performed at [facility], [address] [place], [place] [address]     Radiology Studies: CT ANGIO GI BLEED Result Date: 11/07/2023 EXAM: CTA ABDOMEN AND PELVIS WITH CONTRAST 11/07/2023 01:22:13 PM TECHNIQUE: CTA images of the abdomen and pelvis with intravenous contrast. Three-dimensional MIP/volume rendered formations were performed. Automated exposure control, iterative reconstruction, and/or weight based adjustment of the mA/kV was utilized to reduce the radiation dose to as low as reasonably achievable. COMPARISON: CT  01/06/2023 CLINICAL HISTORY: GI bleed. FINDINGS: VASCULATURE: Extensive calcified aortoiliac atheromatous plaque without aneurysm. Calcified plaque at the origin of the right renal artery resulting in short segment stenosis of at least moderate severity, patent distally. Duplicated left renal arteries, superior dominant, both patent. GI BLEED: No evidence of active GI bleed or acute GI pathology. AORTA: No acute finding. No abdominal aortic aneurysm. No dissection. CELIAC TRUNK: No acute finding. No occlusion or significant stenosis. SUPERIOR MESEMTRIC ARTERY: No acute finding. No occlusion or significant stenosis. INFERIOR MESENTERIC ARTERY: No acute finding. No occlusion or significant stenosis. RENAL ARTERIES: Calcified plaque at the origin of the right renal artery resulting in short segment stenosis of at least moderate severity, patent distally. Duplicated left renal arteries, superior dominant, both patent. ILIAC ARTERIES: No acute finding. Extensive calcified plaque. No occlusion or significant stenosis. ABDOMEN/PELVIS: LOWER CHEST: New small left pleural effusion with dependent consolidation/atelectasis posteriorly in the left lower lobe. There is some patchy opacities peripherally at the right lung base. Transvenous pacing leads partially visualized. LIVER: The liver is unremarkable. GALLBLADDER AND BILE DUCTS: Subcentimeter partially calcified gallstone in the dependent aspect of the nondilated gallbladder. No biliary ductal dilatation. SPLEEN: The spleen is unremarkable. PANCREAS: main duct dilatation in the pancreatic body, progressive since prior exam without discrete obstructing lesion or regional inflammatory changes. ADRENAL GLANDS: Bilateral adrenal glands demonstrate no acute abnormality. KIDNEYS, URETERS AND BLADDER: Stable left upper pole renal cortical cyst. No stones in the kidneys or ureters. No hydronephrosis. No perinephric or periureteral stranding. Urinary bladder is unremarkable. GI AND  BOWEL: Anastomotic staple lines in the right lower quadrant. Small hiatal hernia. A few small diverticula near the descending/sigmoid junction without adjacent inflammatory change. No evidence of active GI bleed or acute GI pathology. REPRODUCTIVE: Reproductive organs are unremarkable. PERITONEUM AND RETROPERITONEUM: No ascites or free air. LYMPH NODES: No lymphadenopathy. BONES AND SOFT TISSUES: Bilateral hip DJD. Multilevel lumbar spondylitic change. No acute abnormality of the bones. No acute soft tissue abnormality. IMPRESSION: 1. No evidence of active GI bleed or acute GI pathology. 2. Main duct dilatation in the pancreatic body, progressive since prior exam without discrete obstructing lesion or regional inflammatory changes. Elective MRCP or ERCP may be useful for further evaluation. 3. New small left pleural effusion with dependent consolidation/atelectasis posteriorly in the left lower lobe. Patchy opacities peripherally at the right lung base. Electronically signed by:  Dayne Hassell MD 11/07/2023 01:42 PM EDT RP Workstation: [id number]    Scheduled Meds:  sodium chloride    Intravenous Once   ezetimibe   10 mg Oral Daily   folic acid   1 mg Oral BID   levothyroxine   25 mcg Oral Daily   pantoprazole  (PROTONIX ) IV  40 mg Intravenous BID   predniSONE   5 mg Oral Daily   Continuous Infusions:  sodium chloride  20 mL/hr at 11/07/23 2139     LOS: 0 days   Fredia Skeeter, MD [facility]  11/08/2023, 7:37 AM   *Please note that this is a verbal dictation therefore any spelling or grammatical errors are due to the Dragon Medical One system interpretation.  Please page via Amion and do not message via secure chat for urgent patient care matters. Secure chat can be used for non urgent patient care matters.  How to contact the [facility] Attending or Consulting provider 7A - 7P or covering provider during after hours 7P -7A, for this patient?  Check the care team in [facility] and look for a)  attending/consulting [facility] provider listed and b) the [facility] team listed. Page or secure chat 7A-7P. Log into [email or web address] and use [facility]'s universal password to access. If you do not have the password, please contact the hospital operator. Locate the [facility] provider you are looking for under [facility] and page to a number that you can be directly reached. If you still have difficulty reaching the provider, please page the [facility] (Director on Call) for the Hospitalists listed on amion for assistance.

## 2023-11-09 DIAGNOSIS — K269 Duodenal ulcer, unspecified as acute or chronic, without hemorrhage or perforation: Secondary | ICD-10-CM | POA: Diagnosis present

## 2023-11-09 DIAGNOSIS — R531 Weakness: Secondary | ICD-10-CM | POA: Diagnosis not present

## 2023-11-09 DIAGNOSIS — D62 Acute posthemorrhagic anemia: Secondary | ICD-10-CM | POA: Diagnosis present

## 2023-11-09 DIAGNOSIS — K259 Gastric ulcer, unspecified as acute or chronic, without hemorrhage or perforation: Secondary | ICD-10-CM | POA: Diagnosis present

## 2023-11-09 DIAGNOSIS — K922 Gastrointestinal hemorrhage, unspecified: Secondary | ICD-10-CM | POA: Diagnosis not present

## 2023-11-09 DIAGNOSIS — Z7401 Bed confinement status: Secondary | ICD-10-CM | POA: Diagnosis not present

## 2023-11-09 DIAGNOSIS — K625 Hemorrhage of anus and rectum: Secondary | ICD-10-CM | POA: Diagnosis not present

## 2023-11-09 LAB — BPAM RBC
Blood Product Expiration Date: 202509192359
Blood Product Expiration Date: 202509192359
Blood Product Expiration Date: 202509252359
Blood Product Expiration Date: 202509252359
ISSUE DATE / TIME: 202508301056
ISSUE DATE / TIME: 202508301056
Unit Type and Rh: 5100
Unit Type and Rh: 5100
Unit Type and Rh: 6200
Unit Type and Rh: 6200

## 2023-11-09 LAB — TYPE AND SCREEN
ABO/RH(D): AB POS
Antibody Screen: NEGATIVE
Unit division: 0
Unit division: 0
Unit division: 0
Unit division: 0

## 2023-11-09 LAB — CBC WITH DIFFERENTIAL/PLATELET
Abs Immature Granulocytes: 0.68 K/uL — ABNORMAL HIGH (ref 0.00–0.07)
Basophils Absolute: 0.1 K/uL (ref 0.0–0.1)
Basophils Relative: 1 %
Eosinophils Absolute: 0.2 K/uL (ref 0.0–0.5)
Eosinophils Relative: 1 %
HCT: 33.4 % — ABNORMAL LOW (ref 36.0–46.0)
Hemoglobin: 11.1 g/dL — ABNORMAL LOW (ref 12.0–15.0)
Immature Granulocytes: 7 %
Lymphocytes Relative: 10 %
Lymphs Abs: 1 K/uL (ref 0.7–4.0)
MCH: 30.9 pg (ref 26.0–34.0)
MCHC: 33.2 g/dL (ref 30.0–36.0)
MCV: 93 fL (ref 80.0–100.0)
Monocytes Absolute: 0.7 K/uL (ref 0.1–1.0)
Monocytes Relative: 7 %
Neutro Abs: 7.9 K/uL — ABNORMAL HIGH (ref 1.7–7.7)
Neutrophils Relative %: 74 %
Platelets: 197 K/uL (ref 150–400)
RBC: 3.59 MIL/uL — ABNORMAL LOW (ref 3.87–5.11)
RDW: 16.9 % — ABNORMAL HIGH (ref 11.5–15.5)
WBC: 10.5 K/uL (ref 4.0–10.5)
nRBC: 0.8 % — ABNORMAL HIGH (ref 0.0–0.2)

## 2023-11-09 LAB — BASIC METABOLIC PANEL WITH GFR
Anion gap: 16 — ABNORMAL HIGH (ref 5–15)
BUN: 26 mg/dL — ABNORMAL HIGH (ref 8–23)
CO2: 17 mmol/L — ABNORMAL LOW (ref 22–32)
Calcium: 8.1 mg/dL — ABNORMAL LOW (ref 8.9–10.3)
Chloride: 106 mmol/L (ref 98–111)
Creatinine, Ser: 0.94 mg/dL (ref 0.44–1.00)
GFR, Estimated: 58 mL/min — ABNORMAL LOW (ref >60)
Glucose, Bld: 87 mg/dL (ref 70–99)
Potassium: 4.1 mmol/L (ref 3.5–5.1)
Sodium: 139 mmol/L (ref 135–145)

## 2023-11-09 MED ORDER — MUSCLE RUB 10-15 % EX CREA
TOPICAL_CREAM | CUTANEOUS | Status: DC | PRN
  Filled 2023-11-09: qty 85

## 2023-11-09 MED ORDER — SODIUM BICARBONATE 650 MG PO TABS: 650.0000 mg | ORAL_TABLET | Freq: Two times a day (BID) | ORAL | 0 refills | Status: AC

## 2023-11-09 MED ORDER — OMEPRAZOLE 40 MG PO CPDR: DELAYED_RELEASE_CAPSULE | ORAL | 0 refills | Status: AC

## 2023-11-09 NOTE — Discharge Summary (Signed)
 Physician Discharge Summary  Pamela Dunn FMW:[id number] DOB: September 01, 1931 DOA: 11/07/2023  PCP: Gerome Brunet, DO  Admit date: 11/07/2023 Discharge date: 11/09/2023    Admitted From: Home Disposition: Home  Recommendations for Outpatient Follow-up:  Follow up with PCP in 1-2 weeks Please obtain BMP/CBC in one week Follow-up with your primary GI and 2 to 4 weeks Please follow up with your PCP on the following pending results: Unresulted Labs (From admission, onward)     Start     Ordered   11/08/23 1700  CBC with Differential/Platelet  5A & 5P,   R (with TIMED occurrences)      11/08/23 0911   11/07/23 1406  Gastrointestinal Panel by PCR , Stool  (Gastrointestinal Panel by PCR, Stool                                                                                                                                                     **Does Not include CLOSTRIDIUM DIFFICILE testing. **If CDIFF testing is needed, place order from the C Difficile Testing order set.**)  Once,   URGENT        11/07/23 1405              Home Health: Yes Equipment/Devices: Wheelchair, slide board, question seat  Discharge Condition: Stable CODE STATUS: Full code Diet recommendation:  Diet Order             Diet regular Room service appropriate? Yes; Fluid consistency: Thin  Diet effective now                   Subjective: Seen and examined, she has no complaints.  Son and daughter-in-law at the bedside.  Patient is ready to go home.  Brief/Interim Summary: Pamela Dunn is a 89 y.o. female with medical history significant of GERD, RA on prednisone , hyperlipidemia, history of complete heart block status post pacemaker, hypothyroidism, combined systolic and diastolic congestive heart failure, cardiomyopathy, peptic ulcer disease who presented to ED with the complaint of rectal bleeding as well as melanotic stool.  She was brought into the emergency department.  She had no other complaints.    Initially upon arrival to ED, she was hypotensive with blood pressure of 65/31.  Her hemoglobin quickly dropped from 8.0-7.5.  Patient received 500 cc of IV fluid bolus as well as 2 units of PRBC transfusion.  Patient's blood pressure improved and recent blood pressure reading is 113/78.  FOBT was positive.  GI was consulted who requested admission for possible EGD or colonoscopy.  EDP initially discussed the case with ICU but since patient was not requiring and blood pressure improved with blood transfusion, they recommended admission under hospital service.  Details of hospitalization as below.  Acute blood loss anemia either secondary to lower GI or upper GI bleed: Patient started on Protonix   twice daily for presumed upper GI bleed as well as lower GI bleed.  She received 2 units of PRBC transfusion, her hemoglobin improved from 7.5 pretransfusion to 11.1 posttransfusion and remained stable for last 4 blood draws.  Patient seen by GI, underwent EGD by Dr. Rollin on 11/08/2023, was found to have nonbleeding gastric and duodenal ulcer.  GI cleared her for discharge with recommendations to continue Protonix  40 mg once daily, biopsies were taken which GI will follow.  They recommended patient to follow-up with her primary GI doctors in 2 to 4 weeks.  However I am going to discharge this patient on Protonix  twice daily for 1 month followed by once daily.  Will further defer to patient's primary GI after completion of this 29-month regimen.   Lactic acidosis: Lactic acid was 3.4 initially, repeat is 2.6 after getting some fluids.  Likely in the setting of GI bleed.  Lactic acidosis resolved.   History of hypertension but presented with hypotension: Initial blood pressure 65/31.  Blood pressure now has improved.  Resume home medications.  Mild metabolic acidosis.  CO2 17.  Discharging on 10 days worth of sodium bicarb.  Recommend repeating BMP at PCPs office and further recommendations per PCP based on the  results.   Mild hyponatremia: Resolved   Chronic systolic and diastolic congestive heart failure: Appears euvolemic.  Hold diuretics for now.   Hypothyroidism: Resume Synthroid .   Hyperlipidemia: Resume medications.  Discharge plan was discussed with patient and/or family member and they verbalized understanding and agreed with it.  Discharge Diagnoses:  Principal Problem:   GIB (gastrointestinal bleeding) Active Problems:   Hyperlipidemia   Hypothyroidism   Gastric ulcer   Duodenal ulcer   Acute blood loss anemia (ABLA)    Discharge Instructions   Allergies as of 11/09/2023       Reactions   Coreg  [carvedilol ] Diarrhea   Lorazepam Diarrhea        Medication List     TAKE these medications    cholecalciferol  1000 units tablet Commonly known as: VITAMIN D  Take 1,000 Units by mouth daily.   ezetimibe  10 MG tablet Commonly known as: ZETIA  Take 10 mg by mouth daily.   folic acid  1 MG tablet Commonly known as: FOLVITE  Take 1 mg by mouth 2 (two) times daily.   furosemide  20 MG tablet Commonly known as: LASIX  TAKE 1 TABLET BY MOUTH EVERY DAY AS NEEDED   guaiFENesin 600 MG 12 hr tablet Commonly known as: MUCINEX Take 600 mg by mouth 2 (two) times daily as needed for cough or to loosen phlegm.   isosorbide -hydrALAZINE  20-37.5 MG tablet Commonly known as: BIDIL  Take 1 tablet by mouth 3 (three) times daily.   levothyroxine  25 MCG tablet Commonly known as: SYNTHROID  Take 25 mcg by mouth daily.   metoprolol  succinate 25 MG 24 hr tablet Commonly known as: TOPROL -XL Take 1 tablet (25 mg total) by mouth daily.   OMEGA-3 FISH OIL PO Take 1 capsule by mouth in the morning and at bedtime.   omeprazole  40 MG capsule Commonly known as: PRILOSEC Take 1 capsule (40 mg total) by mouth 2 (two) times daily for 30 days, THEN 1 capsule (40 mg total) daily. Start taking on: November 09, 2023   polyethylene glycol 17 g packet Commonly known as: MIRALAX  /  GLYCOLAX  Take 17 g by mouth daily.   predniSONE  5 MG tablet Commonly known as: DELTASONE  Take 5 mg by mouth daily.   PRESERVISION AREDS PO Take 1 Dose by  mouth 2 (two) times daily.   sodium bicarbonate  650 MG tablet Take 1 tablet (650 mg total) by mouth 2 (two) times daily for 10 days.        Follow-up Information     Gerome Brunet, DO Follow up in 1 week(s).   Specialty: Family Medicine Contact information: [address] [place] [address] [phone]         [facility] Follow up in 1 month(s).                 Allergies  Allergen Reactions   Coreg  [Carvedilol ] Diarrhea   Lorazepam Diarrhea    Consultations: GI   Procedures/Studies: CT ANGIO GI BLEED Result Date: 11/07/2023 EXAM: CTA ABDOMEN AND PELVIS WITH CONTRAST 11/07/2023 01:22:13 PM TECHNIQUE: CTA images of the abdomen and pelvis with intravenous contrast. Three-dimensional MIP/volume rendered formations were performed. Automated exposure control, iterative reconstruction, and/or weight based adjustment of the mA/kV was utilized to reduce the radiation dose to as low as reasonably achievable. COMPARISON: CT 01/06/2023 CLINICAL HISTORY: GI bleed. FINDINGS: VASCULATURE: Extensive calcified aortoiliac atheromatous plaque without aneurysm. Calcified plaque at the origin of the right renal artery resulting in short segment stenosis of at least moderate severity, patent distally. Duplicated left renal arteries, superior dominant, both patent. GI BLEED: No evidence of active GI bleed or acute GI pathology. AORTA: No acute finding. No abdominal aortic aneurysm. No dissection. CELIAC TRUNK: No acute finding. No occlusion or significant stenosis. SUPERIOR MESEMTRIC ARTERY: No acute finding. No occlusion or significant stenosis. INFERIOR MESENTERIC ARTERY: No acute finding. No occlusion or significant stenosis. RENAL ARTERIES: Calcified plaque at the origin of the right renal artery  resulting in short segment stenosis of at least moderate severity, patent distally. Duplicated left renal arteries, superior dominant, both patent. ILIAC ARTERIES: No acute finding. Extensive calcified plaque. No occlusion or significant stenosis. ABDOMEN/PELVIS: LOWER CHEST: New small left pleural effusion with dependent consolidation/atelectasis posteriorly in the left lower lobe. There is some patchy opacities peripherally at the right lung base. Transvenous pacing leads partially visualized. LIVER: The liver is unremarkable. GALLBLADDER AND BILE DUCTS: Subcentimeter partially calcified gallstone in the dependent aspect of the nondilated gallbladder. No biliary ductal dilatation. SPLEEN: The spleen is unremarkable. PANCREAS: main duct dilatation in the pancreatic body, progressive since prior exam without discrete obstructing lesion or regional inflammatory changes. ADRENAL GLANDS: Bilateral adrenal glands demonstrate no acute abnormality. KIDNEYS, URETERS AND BLADDER: Stable left upper pole renal cortical cyst. No stones in the kidneys or ureters. No hydronephrosis. No perinephric or periureteral stranding. Urinary bladder is unremarkable. GI AND BOWEL: Anastomotic staple lines in the right lower quadrant. Small hiatal hernia. A few small diverticula near the descending/sigmoid junction without adjacent inflammatory change. No evidence of active GI bleed or acute GI pathology. REPRODUCTIVE: Reproductive organs are unremarkable. PERITONEUM AND RETROPERITONEUM: No ascites or free air. LYMPH NODES: No lymphadenopathy. BONES AND SOFT TISSUES: Bilateral hip DJD. Multilevel lumbar spondylitic change. No acute abnormality of the bones. No acute soft tissue abnormality. IMPRESSION: 1. No evidence of active GI bleed or acute GI pathology. 2. Main duct dilatation in the pancreatic body, progressive since prior exam without discrete obstructing lesion or regional inflammatory changes. Elective MRCP or ERCP may be useful  for further evaluation. 3. New small left pleural effusion with dependent consolidation/atelectasis posteriorly in the left lower lobe. Patchy opacities peripherally at the right lung base. Electronically signed by: Dayne Hassell MD 11/07/2023 01:42 PM EDT RP Workstation: [id number]   CUP PACEART  REMOTE DEVICE CHECK Result Date: 10/15/2023 PPM Scheduled remote reviewed. Normal device function.  Presenting rhythm: mostly AS/VP Next remote 91 days. [place], CVRS    Discharge Exam: Vitals:   11/08/23 2103 11/09/23 0756  BP:  (!) 130/59  Pulse: 97 98  Resp:  18  Temp: 97.6 F (36.4 C) 98.1 F (36.7 C)  SpO2: 96% 97%   Vitals:   11/08/23 1700 11/08/23 2059 11/08/23 2103 11/09/23 0756  BP: 134/79 (!) 115/42 (!) 115/42 (!) 130/59  Pulse: 87 100 97 98  Resp: 20 18  18   Temp: 98.5 F (36.9 C) 97.6 F (36.4 C) 97.6 F (36.4 C) 98.1 F (36.7 C)  TempSrc:  Oral Oral   SpO2: 95% 96% 96% 97%  Weight:      Height:        General: Pt is alert, awake, not in acute distress Cardiovascular: RRR, S1/S2 +, no rubs, no gallops Respiratory: CTA bilaterally, no wheezing, no rhonchi Abdominal: Soft, NT, ND, bowel sounds + Extremities: no edema, no cyanosis    The results of significant diagnostics from this hospitalization (including imaging, microbiology, ancillary and laboratory) are listed below for reference.     Microbiology: Recent Results (from the past 240 hours)  C Difficile Quick Screen w PCR reflex     Status: None   Collection Time: 11/07/23  1:57 PM   Specimen: STOOL  Result Value Ref Range Status   C Diff antigen NEGATIVE NEGATIVE Final   C Diff toxin NEGATIVE NEGATIVE Final   C Diff interpretation No C. difficile detected.  Final    Comment: Performed at [facility], [address] [place], [place] [address]     Labs: BNP (last 3 results) Recent Labs    01/07/23 0738 03/16/23 2340  BNP 385.1* 120.3*   Basic Metabolic Panel: Recent Labs  Lab 11/07/23 1052  11/07/23 1118 11/08/23 0534 11/09/23 0943  NA 133* 133* 135 139  K 4.7 4.6 4.9 4.1  CL 100 102 106 106  CO2 20*  --  18* 17*  GLUCOSE 121* 121* 76 87  BUN 48* 45* 32* 26*  CREATININE 0.97 1.00 0.82 0.94  CALCIUM  8.0*  --  7.9* 8.1*   Liver Function Tests: Recent Labs  Lab 11/07/23 1052 11/08/23 0534  AST 22 23  ALT 16 11  ALKPHOS 44 43  BILITOT 0.5 1.2  PROT 4.6* 4.2*  ALBUMIN 1.9* 1.7*   No results for input(s): LIPASE, AMYLASE in the last 168 hours. No results for input(s): AMMONIA in the last 168 hours. CBC: Recent Labs  Lab 11/07/23 1052 11/07/23 1118 11/07/23 1327 11/07/23 2020 11/08/23 1948 11/09/23 0943  WBC 18.0*  --   --  15.0* 13.2* 10.5  NEUTROABS 12.5*  --   --   --  11.0* 7.9*  HGB 8.0* 7.5* 11.3* 11.1* 11.1* 11.1*  HCT 25.9* 22.0* 34.3* 32.0* 32.6* 33.4*  MCV 103.6*  --   --  89.9 92.1 93.0  PLT 217  --   --  159 192 197   Cardiac Enzymes: No results for input(s): CKTOTAL, CKMB, CKMBINDEX, TROPONINI in the last 168 hours. BNP: Invalid input(s): POCBNP CBG: No results for input(s): GLUCAP in the last 168 hours. D-Dimer No results for input(s): DDIMER in the last 72 hours. Hgb A1c No results for input(s): HGBA1C in the last 72 hours. Lipid Profile No results for input(s): CHOL, HDL, LDLCALC, TRIG, CHOLHDL, LDLDIRECT in the last 72 hours. Thyroid  function studies No results  for input(s): TSH, T4TOTAL, T3FREE, THYROIDAB in the last 72 hours.  Invalid input(s): FREET3 Anemia work up No results for input(s): VITAMINB12, FOLATE, FERRITIN, TIBC, IRON, RETICCTPCT in the last 72 hours. Urinalysis    Component Value Date/Time   COLORURINE YELLOW 01/06/2023 2209   APPEARANCEUR CLEAR 01/06/2023 2209   LABSPEC 1.013 01/06/2023 2209   PHURINE 6.0 01/06/2023 2209   GLUCOSEU NEGATIVE 01/06/2023 2209   HGBUR NEGATIVE 01/06/2023 2209   BILIRUBINUR NEGATIVE 01/06/2023 2209   KETONESUR NEGATIVE  01/06/2023 2209   PROTEINUR NEGATIVE 01/06/2023 2209   NITRITE NEGATIVE 01/06/2023 2209   LEUKOCYTESUR NEGATIVE 01/06/2023 2209   Sepsis Labs Recent Labs  Lab 11/07/23 1052 11/07/23 2020 11/08/23 1948 11/09/23 0943  WBC 18.0* 15.0* 13.2* 10.5   Microbiology Recent Results (from the past 240 hours)  C Difficile Quick Screen w PCR reflex     Status: None   Collection Time: 11/07/23  1:57 PM   Specimen: STOOL  Result Value Ref Range Status   C Diff antigen NEGATIVE NEGATIVE Final   C Diff toxin NEGATIVE NEGATIVE Final   C Diff interpretation No C. difficile detected.  Final    Comment: Performed at [facility], [address], [place], [place] [address]    FURTHER DISCHARGE INSTRUCTIONS:   Get Medicines reviewed and adjusted: Please take all your medications with you for your next visit with your Primary MD   Laboratory/radiological data: Please request your Primary MD to go over all hospital tests and procedure/radiological results at the follow up, please ask your Primary MD to get all Hospital records sent to his/her office.   In some cases, they will be blood work, cultures and biopsy results pending at the time of your discharge. Please request that your primary care M.D. goes through all the records of your hospital data and follows up on these results.   Also Note the following: If you experience worsening of your admission symptoms, develop shortness of breath, life threatening emergency, suicidal or homicidal thoughts you must seek medical attention immediately by calling 911 or calling your MD immediately  if symptoms less severe.   You must read complete instructions/literature along with all the possible adverse reactions/side effects for all the Medicines you take and that have been prescribed to you. Take any new Medicines after you have completely understood and accpet all the possible adverse reactions/side effects.    patient was instructed, not to drive,  operate heavy machinery, perform activities at heights, swimming or participation in water activities or provide baby-sitting services while on Pain, Sleep and Anxiety Medications; until their outpatient Physician has advised to do so again. Also recommended to not to take more than prescribed Pain, Sleep and Anxiety Medications.  It is not advisable to combine anxiety, sleep and pain medications without talking with your primary care provider.     Wear Seat belts while driving.   Please note: You were cared for by a hospitalist during your hospital stay. Once you are discharged, your primary care physician will handle any further medical issues. Please note that NO REFILLS for any discharge medications will be authorized once you are discharged, as it is imperative that you return to your primary care physician (or establish a relationship with a primary care physician if you do not have one) for your post hospital discharge needs so that they can reassess your need for medications and monitor your lab values  Time coordinating discharge: Over 30 minutes  SIGNED:  Fredia Skeeter, MD  [facility] 11/09/2023, 10:44 AM *Please note that this is a verbal dictation therefore any spelling or grammatical errors are due to the Dragon Medical One system interpretation. If 7PM-7AM, please contact night-coverage [email or web address]

## 2023-11-09 NOTE — Evaluation (Signed)
 Physical Therapy Evaluation  Patient Details Name: Pamela Dunn MRN: [id number] DOB: [birth year] Today's Date: 11/09/2023  History of Present Illness  Pt is a 89 y.o. female presenting 8/30 rectal bleeding and concern for GIB. S/p EGD 8/31. PMH: CHF, complete heart block status post pacemaker, CKD, ischemic cardiomyopathy, HLD, RA on prednisone , hypothyroidism, and peptic ulcer disease   Clinical Impression  Pt admitted with above. PTA pt lives alone with caregivers for 12 hours daily during the day. Pt requiring maxA for transfers, bathing, dressing, and meal prep. Pt using transport chair primarily at the moment. Educated caregiver on proper use of gait belt and how to transfer pt optimally to minimize injury to herself and patient. Recommend home health PT, w/c with cushion and drop arm in addition to slide board to optimize safety for both pt and caregiver. Acute PT to cont to follow.        If plan is discharge home, recommend the following: A lot of help with walking and/or transfers;A lot of help with bathing/dressing/bathroom;Assist for transportation   Can travel by private vehicle        Equipment Recommendations Wheelchair (measurements PT);Wheelchair cushion (measurements PT) (slide board)  Recommendations for Other Services       Functional Status Assessment Patient has had a recent decline in their functional status and demonstrates the ability to make significant improvements in function in a reasonable and predictable amount of time.     Precautions / Restrictions Precautions Precautions: Fall Restrictions Weight Bearing Restrictions Per Provider Order: No      Mobility  Bed Mobility Overal bed mobility: Needs Assistance Bed Mobility: Supine to Sit     Supine to sit: Min assist     General bed mobility comments: for trunk and scooting toward EOB    Transfers                   General transfer comment: educated caregiver [name] on how to use gait  belt and transition weight anteriorly vs elevating causing gait belt to slide up towards pts shoulders, pt would not attempt to stand on feet but did lean forward to showcase anterior weight shift    Ambulation/Gait               General Gait Details: unable  Stairs            Wheelchair Mobility     Tilt Bed    Modified Rankin (Stroke Patients Only)       Balance Overall balance assessment: Needs assistance Sitting-balance support: Feet supported, No upper extremity supported Sitting balance-Leahy Scale: Fair                                       Pertinent Vitals/Pain Pain Assessment Pain Assessment: Faces Faces Pain Scale: Hurts even more Pain Location: BLE, back Pain Descriptors / Indicators: Aching, Sore, Tender    Home Living Family/patient expects to be discharged to:: Private residence Living Arrangements: Alone Available Help at Discharge: Family;Available 24 hours/day Type of Home: House Home Access: Ramped entrance       Home Layout: One level Home Equipment: Agricultural consultant (2 wheels);Shower seat;Cane - single point;Grab bars - tub/shower;Rollator (4 wheels);Transport chair;Lift chair;Hand held shower head;Grab bars - toilet (tray on wheels in kitchen, upright rollator, adjustable bed but sleeps in regular bed)      Prior Function Prior Level of Function :  Needs assist             Mobility Comments: recently receiving mod-total assist for squat pivot transfer to and from transport chair and from transport chair to other home surfaces ADLs Comments: Pt with caregiver 14 hours/day M-F and 12 hours saturday and sunday. caregivers assist with BADL, home managament, meals, medication management, transfers, etc as pt needs. recent DVT in UEs with caregivers recently asissting with self feeding but pt recently returned to this.     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 unable to bring hand to face at this time due to weakness. deformity from RA    Lower Extremity Assessment Lower Extremity Assessment: Generalized weakness (pt with RA, bilat LEs in external rotation, grossly 3-/5, minimal active ankle ROM)    Cervical / Trunk Assessment Cervical / Trunk Assessment: Kyphotic  Communication   Communication Communication: No apparent difficulties    Cognition Arousal: Alert Behavior During Therapy: Anxious (particular in her ways, only trusts caregiver [name])                             Following commands: Intact (for one step commands)       Cueing Cueing Techniques: Verbal cues     General Comments General comments (skin integrity, edema, etc.): VSS    Exercises     Assessment/Plan    PT Assessment    PT Problem List         PT Treatment Interventions      PT Goals (Current goals can be found in the Care Plan section)  Acute Rehab PT Goals Patient Stated Goal: walk down my hall PT Goal Formulation: With patient Time For Goal Achievement: 11/23/23 Potential to Achieve Goals: Fair    Frequency       Co-evaluation               AM-PAC PT 6 Clicks Mobility  Outcome Measure                  End of Session Equipment Utilized During Treatment: Gait belt Activity Tolerance: Patient tolerated treatment well          Time: 1045-1105 PT Time Calculation (min) (ACUTE ONLY): 20 min   Charges:   PT Evaluation $PT Eval Low Complexity: 1 Low   PT General Charges $$ ACUTE PT VISIT: 1 Visit         Norene Ames, PT, DPT Acute Rehabilitation Services Secure chat preferred Office #:  [phone]   Norene CHRISTELLA Ames 11/09/2023, 12:09 PM

## 2023-11-09 NOTE — Progress Notes (Signed)
 Transition of Care [facility]) - Inpatient Brief Assessment   Patient Details  Name: RENATTA SHRIEVES MRN: [id number] Date of Birth: June 24, 1931  Transition of Care [facility]) CM/SW Contact:    Rosaline JONELLE Joe, RN Phone Number: 11/09/2023, 11:54 AM   Clinical Narrative: Patient admitted for generalized weakness and GI bleed.  Patient is discharging to home today.  Caregiver [facility] is at the bedside.  The son is available at the home once PTAR is arranged.  PTAr was called for transportation to home.  Patient was provide choice regarding home health agency and she did not have a preference.  I called [facility] and patient is set up with [facility] services.  I called [employer] and ordered wheelchair, slide board and drop arm bedside commode and requested delivery to the home today.  No other TOC needs and patient will discharge home via PTAR.   Transition of Care Asessment: Insurance and Status: (P) Insurance coverage has been reviewed Patient has primary care physician: (P) Yes Home environment has been reviewed: (P) from home with caregiver Prior level of function:: (P) caregiver Prior/Current Home Services: (P)  (private HHA at the home) Social Drivers of Health Review: (P) SDOH reviewed interventions complete Readmission risk has been reviewed: (P) Yes Transition of care needs: (P) transition of care needs identified, TOC will continue to follow

## 2023-11-09 NOTE — Evaluation (Signed)
 Occupational Therapy Evaluation Patient Details Name: Pamela Dunn MRN: [id number] DOB: 12-20-1931 Today's Date: 11/09/2023   History of Present Illness   Pt is a 89 y.o. female presenting 8/30 rectal bleeding and concern for GIB. S/p EGD 8/31. PMH: CHF, complete heart block status post pacemaker, CKD, ischemic cardiomyopathy, HLD, RA on prednisone , hypothyroidism, and peptic ulcer disease     Clinical Impressions PTA, pt lived alone and had caregivers to assist her 14 hours/day M-F and 12 hours/day Saturday/Sunday with BADL, transfers, meal prep, medication management. Per pt, she spends most of her day OOB in a wheelchair, at the kitchen table, or in her lift chair. 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 Pt more recently able to feed self R handed but is L dominant with continued inability to reach her face with her L hand. Upon eval, pt with weakness, decreased strength, balance, safety, and functional participation in BADL. Per family, pt was ambulatory prior to January this year. Recommending HHOT at discharge.      If plan is discharge home, recommend the following:   A lot of help with walking and/or transfers;A lot of help with bathing/dressing/bathroom;Assistance with cooking/housework;Direct supervision/assist for medications management;Direct supervision/assist for financial management;Assist for transportation;Help with stairs or ramp for entrance;Assistance with feeding     Functional Status Assessment   Patient has had a recent decline in their functional status and demonstrates the ability to make significant improvements in function in a reasonable and predictable amount of time.     Equipment Recommendations   Wheelchair (measurements OT);Wheelchair cushion (measurements OT);BSC/3in1 (preferable wheelchair with drop arm or detachable arm)     Recommendations for Other  Services         Precautions/Restrictions   Precautions Precautions: Fall Restrictions Weight Bearing Restrictions Per Provider Order: No     Mobility Bed Mobility Overal bed mobility: Needs Assistance Bed Mobility: Supine to Sit     Supine to sit: Min assist     General bed mobility comments: for trunk and scooting toward EOB    Transfers                   General transfer comment: pt deferred reporting that caregiver can assist her when she dc home; caregiver confirms comfort even if pt total A      Balance                                           ADL either performed or assessed with clinical judgement   ADL Overall ADL's : Needs assistance/impaired Eating/Feeding: Minimal assistance;Sitting   Grooming: Minimal assistance;Sitting   Upper Body Bathing: Moderate assistance;Sitting   Lower Body Bathing: Total assistance;Sitting/lateral leans   Upper Body Dressing : Moderate assistance;Sitting   Lower Body Dressing: Total assistance;Sitting/lateral leans                       Vision Patient Visual Report: No change from baseline       Perception Perception: Not tested       Praxis Praxis: Not tested       Pertinent Vitals/Pain Pain Assessment Pain Assessment: Faces Faces Pain Scale: Hurts even more Pain Location: BLE, back Pain Descriptors / Indicators: Aching, Sore, Tender Pain Intervention(s): Monitored during session  Extremity/Trunk Assessment Upper Extremity Assessment Upper Extremity Assessment: Generalized weakness;Left hand dominant;RUE deficits/detail;LUE deficits/detail RUE Deficits / Details: per pt recent DVT and 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 unable to bring hand to face  at this time due to weakness. deformity from RA   Lower Extremity Assessment Lower Extremity Assessment: Defer to PT evaluation   Cervical / Trunk Assessment Cervical / Trunk Assessment: Kyphotic   Communication Communication Communication: No apparent difficulties   Cognition Arousal: Alert Behavior During Therapy: WFL for tasks assessed/performed Cognition: No apparent impairments             OT - Cognition Comments: WFL for basic tasks assessed. able to answer PLOF quetsions with caregiver and family confirming accuracy                 Following commands: Intact (for one step commands)       Cueing  General Comments   Cueing Techniques: Verbal cues  VSS   Exercises     Shoulder Instructions      Home Living Family/patient expects to be discharged to:: Private residence Living Arrangements: Alone Available Help at Discharge: Family;Available 24 hours/day Type of Home: House Home Access: Ramped entrance     Home Layout: One level     Bathroom Shower/Tub: Producer, television/film/video: Handicapped height     Home Equipment: Agricultural consultant (2 wheels);Shower seat;Cane - single point;Grab bars - tub/shower;Rollator (4 wheels);Transport chair;Lift chair;Hand held shower head;Grab bars - toilet (tray on wheels in kitchen, upright rollator, adjustable bed but sleeps in regular bed)          Prior Functioning/Environment Prior Level of Function : Needs assist             Mobility Comments: recently receiving mod-total assist for squat pivot transfer to and from transport chair and from transport chair to other home surfaces ADLs Comments: Pt with caregiver 14 hours/day M-F and 12 hours saturday and sunday. caregivers assist with BADL, home managament, meals, medication management, transfers, etc as pt needs. recent DVT in UEs with caregivers recently asissting with self feeding but pt recently returned to this.    OT Problem List: Decreased  strength;Decreased activity tolerance;Impaired balance (sitting and/or standing);Decreased safety awareness;Decreased knowledge of use of DME or AE;Impaired UE functional use   OT Treatment/Interventions: Self-care/ADL training;Therapeutic exercise;Therapeutic activities;Patient/family education;Balance training;DME and/or AE instruction      OT Goals(Current goals can be found in the care plan section)   Acute Rehab OT Goals Patient Stated Goal: I want to be able to stand at my sink and groom and walk down my hall OT Goal Formulation: With patient Time For Goal Achievement: 11/23/23 Potential to Achieve Goals: Good   OT Frequency:  Min 1X/week    Co-evaluation              AM-PAC OT 6 Clicks Daily Activity     Outcome Measure Help from another person eating meals?: A Little Help from another person taking care of personal grooming?: A Little Help from another person toileting, which includes using toliet, bedpan, or urinal?: Total Help from another person bathing (including washing, rinsing, drying)?: A Lot Help from another person to put on and taking off regular upper body clothing?:  A Lot Help from another person to put on and taking off regular lower body clothing?: Total 6 Click Score: 12   End of Session Nurse Communication: Mobility status  Activity Tolerance: Patient tolerated treatment well Patient left: in bed;with call bell/phone within reach;with bed alarm set;with family/visitor present  OT Visit Diagnosis: Unsteadiness on feet (R26.81);Muscle weakness (generalized) (M62.81);Pain                Time: 9053-8983 OT Time Calculation (min): 30 min Charges:  OT General Charges $OT Visit: 1 Visit OT Evaluation $OT Eval Low Complexity: 1 Low OT Treatments $Self Care/Home Management : 8-22 mins  Elma JONETTA Lebron FREDERICK, OTR/L [facility] Acute Rehabilitation Office: [phone]   Elma JONETTA Lebron 11/09/2023, 10:37 AM

## 2023-11-09 NOTE — Progress Notes (Signed)
 DISCHARGE NOTE [facility] to be discharged Home per MD order. Patient verbalized understanding.  Skin clean, dry and intact without evidence of skin break down, no evidence of skin tears noted. IV catheter discontinued intact. Site without signs and symptoms of complications. Dressing and pressure applied. Pt denies pain at the site currently. No complaints noted.  SeeLDA for wounds Patient free of lines, drains, and wounds.   Discharge packet assembled. An After Visit Summary (AVS) was printed and given to the EMS personnel. Patient escorted via stretcher and discharged to Home via ambulance.  all questions and concerns addressed.   Peyton SHAUNNA Pepper, RN

## 2023-11-09 NOTE — Progress Notes (Signed)
    Durable Medical Equipment  (From admission, onward)           Start     Ordered   11/09/23 1139  For home use only DME standard manual wheelchair with seat cushion  Once       Comments: Patient suffers from rheumatoid arthritis, physical deconditioning which impairs their ability to perform daily activities like toileting in the home.  A walker will not resolve issue with performing activities of daily living. A wheelchair will allow patient to safely perform daily activities. Patient can safely propel the wheelchair in the home or has a caregiver who can provide assistance. Length of need Lifetime. Accessories: elevating leg rests (ELRs), wheel locks, extensions and anti-tippers.  Patient is 5 ft, weight:  61 kg.   11/09/23 1140   11/09/23 1138  For home use only DME Other see comment  Once       Comments: Slide board is needed/  Question:  Length of Need  Answer:  6 Months   11/09/23 1140

## 2023-11-10 ENCOUNTER — Encounter (HOSPITAL_COMMUNITY): Payer: Self-pay | Admitting: Gastroenterology

## 2023-11-10 DIAGNOSIS — R2689 Other abnormalities of gait and mobility: Secondary | ICD-10-CM | POA: Diagnosis not present

## 2023-11-10 DIAGNOSIS — K259 Gastric ulcer, unspecified as acute or chronic, without hemorrhage or perforation: Secondary | ICD-10-CM | POA: Diagnosis not present

## 2023-11-10 DIAGNOSIS — M069 Rheumatoid arthritis, unspecified: Secondary | ICD-10-CM | POA: Diagnosis not present

## 2023-11-12 DIAGNOSIS — I110 Hypertensive heart disease with heart failure: Secondary | ICD-10-CM | POA: Diagnosis not present

## 2023-11-12 DIAGNOSIS — I5042 Chronic combined systolic (congestive) and diastolic (congestive) heart failure: Secondary | ICD-10-CM | POA: Diagnosis not present

## 2023-11-12 DIAGNOSIS — I429 Cardiomyopathy, unspecified: Secondary | ICD-10-CM | POA: Diagnosis not present

## 2023-11-12 DIAGNOSIS — D62 Acute posthemorrhagic anemia: Secondary | ICD-10-CM | POA: Diagnosis not present

## 2023-11-12 DIAGNOSIS — K279 Peptic ulcer, site unspecified, unspecified as acute or chronic, without hemorrhage or perforation: Secondary | ICD-10-CM | POA: Diagnosis not present

## 2023-11-12 DIAGNOSIS — J9811 Atelectasis: Secondary | ICD-10-CM | POA: Diagnosis not present

## 2023-11-12 DIAGNOSIS — M069 Rheumatoid arthritis, unspecified: Secondary | ICD-10-CM | POA: Diagnosis not present

## 2023-11-12 DIAGNOSIS — M1990 Unspecified osteoarthritis, unspecified site: Secondary | ICD-10-CM | POA: Diagnosis not present

## 2023-11-12 DIAGNOSIS — I442 Atrioventricular block, complete: Secondary | ICD-10-CM | POA: Diagnosis not present

## 2023-11-12 LAB — SURGICAL PATHOLOGY

## 2023-11-13 ENCOUNTER — Encounter: Payer: Self-pay | Admitting: Gastroenterology

## 2023-11-13 DIAGNOSIS — I5042 Chronic combined systolic (congestive) and diastolic (congestive) heart failure: Secondary | ICD-10-CM | POA: Diagnosis not present

## 2023-11-13 DIAGNOSIS — I429 Cardiomyopathy, unspecified: Secondary | ICD-10-CM | POA: Diagnosis not present

## 2023-11-13 DIAGNOSIS — M1990 Unspecified osteoarthritis, unspecified site: Secondary | ICD-10-CM | POA: Diagnosis not present

## 2023-11-13 DIAGNOSIS — I110 Hypertensive heart disease with heart failure: Secondary | ICD-10-CM | POA: Diagnosis not present

## 2023-11-13 DIAGNOSIS — I442 Atrioventricular block, complete: Secondary | ICD-10-CM | POA: Diagnosis not present

## 2023-11-13 DIAGNOSIS — D62 Acute posthemorrhagic anemia: Secondary | ICD-10-CM | POA: Diagnosis not present

## 2023-11-13 DIAGNOSIS — K279 Peptic ulcer, site unspecified, unspecified as acute or chronic, without hemorrhage or perforation: Secondary | ICD-10-CM | POA: Diagnosis not present

## 2023-11-13 DIAGNOSIS — M069 Rheumatoid arthritis, unspecified: Secondary | ICD-10-CM | POA: Diagnosis not present

## 2023-11-13 DIAGNOSIS — J9811 Atelectasis: Secondary | ICD-10-CM | POA: Diagnosis not present

## 2023-11-17 ENCOUNTER — Telehealth: Payer: Self-pay | Admitting: Cardiology

## 2023-11-17 DIAGNOSIS — I442 Atrioventricular block, complete: Secondary | ICD-10-CM | POA: Diagnosis not present

## 2023-11-17 DIAGNOSIS — I110 Hypertensive heart disease with heart failure: Secondary | ICD-10-CM | POA: Diagnosis not present

## 2023-11-17 DIAGNOSIS — K279 Peptic ulcer, site unspecified, unspecified as acute or chronic, without hemorrhage or perforation: Secondary | ICD-10-CM | POA: Diagnosis not present

## 2023-11-17 DIAGNOSIS — I429 Cardiomyopathy, unspecified: Secondary | ICD-10-CM | POA: Diagnosis not present

## 2023-11-17 DIAGNOSIS — M1990 Unspecified osteoarthritis, unspecified site: Secondary | ICD-10-CM | POA: Diagnosis not present

## 2023-11-17 DIAGNOSIS — M069 Rheumatoid arthritis, unspecified: Secondary | ICD-10-CM | POA: Diagnosis not present

## 2023-11-17 DIAGNOSIS — J9811 Atelectasis: Secondary | ICD-10-CM | POA: Diagnosis not present

## 2023-11-17 DIAGNOSIS — D62 Acute posthemorrhagic anemia: Secondary | ICD-10-CM | POA: Diagnosis not present

## 2023-11-17 DIAGNOSIS — I5042 Chronic combined systolic (congestive) and diastolic (congestive) heart failure: Secondary | ICD-10-CM | POA: Diagnosis not present

## 2023-11-17 NOTE — Progress Notes (Deleted)
 Cardiology Office Note    Patient Name: Pamela Dunn Date of Encounter: 11/17/2023  Primary Care Provider:  Gerome Brunet, DO Primary Cardiologist:  Gordy Bergamo, MD Primary Electrophysiologist: Fonda Kitty, MD   Past Medical History    Past Medical History:  Diagnosis Date   Allergy    seasonal   Anemia    Arthritis    Encounter for care of pacemaker 07/12/2020   GERD (gastroesophageal reflux disease)    Heart block AV complete (HCC) 07/11/2020   Hyperlipidemia    Hypertension    Pacemaker Abbott Assurity MRI model [id number] 07/12/2020 07/12/2020    History of Present Illness  Pamela Dunn is a 89 y.o. female with a PMH ofHTN, chronic combined CHF HLD, CHB s/p dual-chamber PPM 07/2020, CKD stage IIIa, and ICM, LBBB, rheumatoid arthritis who presents today for posthospital follow-up.  Pamela Dunn was last seen on/9/25 for 2-week follow-up.  During her visit she reported some residual swelling in her arm that was responding well to elevating with a pillow.  She had recently discontinued Eliquis  and was encouraged to continue current BP medications due to stable BP.  She was recently admitted and treated for GI bleed with hospitalization from 11/07/2023 to 11/09/2023.  She was noted to be hypotensive on arrival and was treated with fluid bolus and 2 units of PRBC.  She had FOBT that was positive for GI bleed.  Hemoglobin was 7.5 prior to transfusion and was 11.1 posttransfusion.  She was evaluated by GI and underwent a EGD by Dr. Rollin on 11/08/2023 and was found to have a nonbleeding gastric and duodenal ulcer.  She was discharged with Protonix  40 mg.  She was also noted to have mild metabolic acidosis and was discharged with 10 days of sodium bicarb.   Patient denies chest pain, palpitations, dyspnea, PND, orthopnea, nausea, vomiting, dizziness, syncope, edema, weight gain, or early satiety.   Discussed the use of AI scribe software for clinical note transcription with the patient, who gave  verbal consent to proceed.  History of Present Illness    ***Notes:   Review of Systems  Please see the history of present illness.    All other systems reviewed and are otherwise negative except as noted above.  Physical Exam    Wt Readings from Last 3 Encounters:  11/07/23 134 lb 14.7 oz (61.2 kg)  04/08/23 131 lb (59.4 kg)  03/17/23 130 lb 15.3 oz (59.4 kg)   CD:Uyzmz were no vitals filed for this visit.,There is no height or weight on file to calculate BMI. GEN: Well nourished, well developed in no acute distress Neck: No JVD; No carotid bruits Pulmonary: Clear to auscultation without rales, wheezing or rhonchi  Cardiovascular: Normal rate. Regular rhythm. Normal S1. Normal S2.   Murmurs: There is no murmur.  ABDOMEN: Soft, non-tender, non-distended EXTREMITIES:  No edema; No deformity   EKG/LABS/ Recent Cardiac Studies   ECG personally reviewed by me today - ***  Risk Assessment/Calculations:   {Does this patient have ATRIAL FIBRILLATION?[phone]}      Lab Results  Component Value Date   WBC 10.5 11/09/2023   HGB 11.1 (L) 11/09/2023   HCT 33.4 (L) 11/09/2023   MCV 93.0 11/09/2023   PLT 197 11/09/2023   Lab Results  Component Value Date   CREATININE 0.94 11/09/2023   BUN 26 (H) 11/09/2023   NA 139 11/09/2023   K 4.1 11/09/2023   CL 106 11/09/2023   CO2 17 (L) 11/09/2023   No  results found for: CHOL, HDL, LDLCALC, LDLDIRECT, TRIG, CHOLHDL  No results found for: HGBA1C Assessment & Plan    Assessment and Plan Assessment & Plan     1.***  2.HFrEF/NICM: -2D echo completed 2022 showing EF of 45% and mildly decreased LV function with grade 1 DD  3.Essential hypertension: -Patient's blood pressure was  4.History of DVT:   5.  History of CHB:      Disposition: Follow-up with Gordy Bergamo, MD or APP in *** months {Are you ordering a CV Procedure (e.g. stress test, cath, DCCV, TEE, etc)?   Press F2        :[id number]}    Signed, Wyn Raddle, Jackee Shove, NP 11/17/2023, 4:44 PM [facility]

## 2023-11-17 NOTE — Telephone Encounter (Signed)
 Spoke with Home Health nurse who reports increasing non- pitting generalized edema and some intermittent SOB on exertion since being discharged from the hospital on 11/09/23.  Pt has been taking Furosemide  20mg  -1 tablet by mouth daily.  She will not allow home health to check her blood pressure and she is unable to weigh daily.  Home Health nurse advised RN will also contact pt to discuss further and she thanked this RN for the callback.

## 2023-11-17 NOTE — Telephone Encounter (Signed)
 Pt c/o swelling/edema: STAT if pt has developed SOB within 24 hours  If swelling, where is the swelling located? Both arms, legs, face   How much weight have you gained and in what time span? Unable to weigh  Have you gained 2 pounds in a day or 5 pounds in a week? Unable to weigh   Do you have a log of your daily weights (if so, list)? Unable to weigh   Are you currently taking a fluid pill? Yes   Are you currently SOB? Intermittent   Have you traveled recently in a car or plane for an extended period of time? No

## 2023-11-17 NOTE — Telephone Encounter (Signed)
 PCP is fine for f/u and they can reach out to us  if needed

## 2023-11-17 NOTE — Telephone Encounter (Addendum)
 Spoke with pt and her caregiver who report increased swelling of her left arm since hospital discharge on 11/09/23.  She reports no additional swelling or new SOB.  Pt with history of DVT - left arm.  Pt has a follow up appointment with her PCP on 11/19/23.  Offered pt appointment with Jackee Wyn PIETY on 11/18/23.  Pt declines stating her son is out of town until next week.   Pt advised will forward message to Dr Ladona and Jackee Wyn PIETY who she last saw to further advise.  Reviewed ED precautions.  Pt verbalizes understanding and thanked Charity fundraiser for the call.

## 2023-11-18 ENCOUNTER — Ambulatory Visit: Admitting: Nurse Practitioner

## 2023-11-18 DIAGNOSIS — J9811 Atelectasis: Secondary | ICD-10-CM | POA: Diagnosis not present

## 2023-11-18 DIAGNOSIS — M069 Rheumatoid arthritis, unspecified: Secondary | ICD-10-CM | POA: Diagnosis not present

## 2023-11-18 DIAGNOSIS — I110 Hypertensive heart disease with heart failure: Secondary | ICD-10-CM | POA: Diagnosis not present

## 2023-11-18 DIAGNOSIS — I442 Atrioventricular block, complete: Secondary | ICD-10-CM | POA: Diagnosis not present

## 2023-11-18 DIAGNOSIS — K279 Peptic ulcer, site unspecified, unspecified as acute or chronic, without hemorrhage or perforation: Secondary | ICD-10-CM | POA: Diagnosis not present

## 2023-11-18 DIAGNOSIS — M1990 Unspecified osteoarthritis, unspecified site: Secondary | ICD-10-CM | POA: Diagnosis not present

## 2023-11-18 DIAGNOSIS — D62 Acute posthemorrhagic anemia: Secondary | ICD-10-CM | POA: Diagnosis not present

## 2023-11-18 DIAGNOSIS — I5042 Chronic combined systolic (congestive) and diastolic (congestive) heart failure: Secondary | ICD-10-CM | POA: Diagnosis not present

## 2023-11-18 DIAGNOSIS — I429 Cardiomyopathy, unspecified: Secondary | ICD-10-CM | POA: Diagnosis not present

## 2023-11-20 ENCOUNTER — Emergency Department (EMERGENCY_DEPARTMENT_HOSPITAL)

## 2023-11-20 ENCOUNTER — Other Ambulatory Visit: Payer: Self-pay

## 2023-11-20 ENCOUNTER — Emergency Department (HOSPITAL_COMMUNITY)

## 2023-11-20 ENCOUNTER — Encounter (HOSPITAL_COMMUNITY): Payer: Self-pay | Admitting: *Deleted

## 2023-11-20 ENCOUNTER — Inpatient Hospital Stay (HOSPITAL_COMMUNITY)

## 2023-11-20 ENCOUNTER — Inpatient Hospital Stay (HOSPITAL_COMMUNITY)
Admission: EM | Admit: 2023-11-20 | Discharge: 2023-11-26 | DRG: 177 | Disposition: A | Discharge status: Home / Self Care | Attending: Internal Medicine | Admitting: Internal Medicine

## 2023-11-20 ENCOUNTER — Inpatient Hospital Stay (HOSPITAL_COMMUNITY)
Admit: 2023-11-20 | Discharge: 2023-11-20 | Disposition: A | Discharge status: Home / Self Care | Attending: Internal Medicine | Admitting: Internal Medicine

## 2023-11-20 DIAGNOSIS — Z79890 Hormone replacement therapy (postmenopausal): Secondary | ICD-10-CM | POA: Diagnosis not present

## 2023-11-20 DIAGNOSIS — E039 Hypothyroidism, unspecified: Secondary | ICD-10-CM | POA: Diagnosis not present

## 2023-11-20 DIAGNOSIS — I428 Other cardiomyopathies: Secondary | ICD-10-CM | POA: Diagnosis present

## 2023-11-20 DIAGNOSIS — I959 Hypotension, unspecified: Secondary | ICD-10-CM | POA: Diagnosis not present

## 2023-11-20 DIAGNOSIS — I5033 Acute on chronic diastolic (congestive) heart failure: Secondary | ICD-10-CM

## 2023-11-20 DIAGNOSIS — I2609 Other pulmonary embolism with acute cor pulmonale: Secondary | ICD-10-CM

## 2023-11-20 DIAGNOSIS — I2693 Single subsegmental pulmonary embolism without acute cor pulmonale: Secondary | ICD-10-CM | POA: Diagnosis present

## 2023-11-20 DIAGNOSIS — Z7189 Other specified counseling: Secondary | ICD-10-CM | POA: Diagnosis not present

## 2023-11-20 DIAGNOSIS — M0579 Rheumatoid arthritis with rheumatoid factor of multiple sites without organ or systems involvement: Secondary | ICD-10-CM | POA: Diagnosis not present

## 2023-11-20 DIAGNOSIS — I82409 Acute embolism and thrombosis of unspecified deep veins of unspecified lower extremity: Secondary | ICD-10-CM | POA: Diagnosis present

## 2023-11-20 DIAGNOSIS — G9389 Other specified disorders of brain: Secondary | ICD-10-CM | POA: Diagnosis not present

## 2023-11-20 DIAGNOSIS — T17920A Food in respiratory tract, part unspecified causing asphyxiation, initial encounter: Secondary | ICD-10-CM | POA: Diagnosis not present

## 2023-11-20 DIAGNOSIS — G9349 Other encephalopathy: Secondary | ICD-10-CM | POA: Diagnosis not present

## 2023-11-20 DIAGNOSIS — Z79899 Other long term (current) drug therapy: Secondary | ICD-10-CM

## 2023-11-20 DIAGNOSIS — E871 Hypo-osmolality and hyponatremia: Principal | ICD-10-CM | POA: Diagnosis present

## 2023-11-20 DIAGNOSIS — I5040 Unspecified combined systolic (congestive) and diastolic (congestive) heart failure: Secondary | ICD-10-CM | POA: Diagnosis not present

## 2023-11-20 DIAGNOSIS — D649 Anemia, unspecified: Secondary | ICD-10-CM | POA: Diagnosis not present

## 2023-11-20 DIAGNOSIS — R140 Abdominal distension (gaseous): Secondary | ICD-10-CM | POA: Diagnosis not present

## 2023-11-20 DIAGNOSIS — E785 Hyperlipidemia, unspecified: Secondary | ICD-10-CM | POA: Diagnosis present

## 2023-11-20 DIAGNOSIS — I10 Essential (primary) hypertension: Secondary | ICD-10-CM | POA: Diagnosis not present

## 2023-11-20 DIAGNOSIS — G9341 Metabolic encephalopathy: Secondary | ICD-10-CM | POA: Diagnosis not present

## 2023-11-20 DIAGNOSIS — I2699 Other pulmonary embolism without acute cor pulmonale: Secondary | ICD-10-CM | POA: Diagnosis not present

## 2023-11-20 DIAGNOSIS — Z950 Presence of cardiac pacemaker: Secondary | ICD-10-CM

## 2023-11-20 DIAGNOSIS — R0602 Shortness of breath: Secondary | ICD-10-CM | POA: Diagnosis not present

## 2023-11-20 DIAGNOSIS — Z888 Allergy status to other drugs, medicaments and biological substances status: Secondary | ICD-10-CM

## 2023-11-20 DIAGNOSIS — R609 Edema, unspecified: Secondary | ICD-10-CM | POA: Diagnosis not present

## 2023-11-20 DIAGNOSIS — I5043 Acute on chronic combined systolic (congestive) and diastolic (congestive) heart failure: Secondary | ICD-10-CM | POA: Diagnosis present

## 2023-11-20 DIAGNOSIS — I5042 Chronic combined systolic (congestive) and diastolic (congestive) heart failure: Secondary | ICD-10-CM | POA: Diagnosis not present

## 2023-11-20 DIAGNOSIS — R627 Adult failure to thrive: Secondary | ICD-10-CM | POA: Diagnosis present

## 2023-11-20 DIAGNOSIS — Z7401 Bed confinement status: Secondary | ICD-10-CM | POA: Diagnosis not present

## 2023-11-20 DIAGNOSIS — U071 COVID-19: Secondary | ICD-10-CM | POA: Diagnosis not present

## 2023-11-20 DIAGNOSIS — Z8719 Personal history of other diseases of the digestive system: Secondary | ICD-10-CM | POA: Diagnosis not present

## 2023-11-20 DIAGNOSIS — I442 Atrioventricular block, complete: Secondary | ICD-10-CM | POA: Diagnosis present

## 2023-11-20 DIAGNOSIS — M069 Rheumatoid arthritis, unspecified: Secondary | ICD-10-CM | POA: Diagnosis present

## 2023-11-20 DIAGNOSIS — Z86711 Personal history of pulmonary embolism: Secondary | ICD-10-CM

## 2023-11-20 DIAGNOSIS — D62 Acute posthemorrhagic anemia: Secondary | ICD-10-CM | POA: Diagnosis not present

## 2023-11-20 DIAGNOSIS — R5383 Other fatigue: Secondary | ICD-10-CM | POA: Diagnosis not present

## 2023-11-20 DIAGNOSIS — Z515 Encounter for palliative care: Secondary | ICD-10-CM

## 2023-11-20 DIAGNOSIS — L89311 Pressure ulcer of right buttock, stage 1: Secondary | ICD-10-CM | POA: Diagnosis present

## 2023-11-20 DIAGNOSIS — I130 Hypertensive heart and chronic kidney disease with heart failure and stage 1 through stage 4 chronic kidney disease, or unspecified chronic kidney disease: Secondary | ICD-10-CM | POA: Diagnosis present

## 2023-11-20 DIAGNOSIS — Z8249 Family history of ischemic heart disease and other diseases of the circulatory system: Secondary | ICD-10-CM | POA: Diagnosis not present

## 2023-11-20 DIAGNOSIS — Z8711 Personal history of peptic ulcer disease: Secondary | ICD-10-CM | POA: Diagnosis not present

## 2023-11-20 DIAGNOSIS — E8809 Other disorders of plasma-protein metabolism, not elsewhere classified: Secondary | ICD-10-CM | POA: Diagnosis present

## 2023-11-20 DIAGNOSIS — Z1152 Encounter for screening for COVID-19: Secondary | ICD-10-CM | POA: Diagnosis not present

## 2023-11-20 DIAGNOSIS — R918 Other nonspecific abnormal finding of lung field: Secondary | ICD-10-CM | POA: Diagnosis not present

## 2023-11-20 DIAGNOSIS — L89321 Pressure ulcer of left buttock, stage 1: Secondary | ICD-10-CM | POA: Diagnosis present

## 2023-11-20 DIAGNOSIS — I110 Hypertensive heart disease with heart failure: Secondary | ICD-10-CM | POA: Diagnosis not present

## 2023-11-20 DIAGNOSIS — I2510 Atherosclerotic heart disease of native coronary artery without angina pectoris: Secondary | ICD-10-CM | POA: Diagnosis not present

## 2023-11-20 DIAGNOSIS — I5023 Acute on chronic systolic (congestive) heart failure: Secondary | ICD-10-CM | POA: Diagnosis not present

## 2023-11-20 DIAGNOSIS — I82411 Acute embolism and thrombosis of right femoral vein: Secondary | ICD-10-CM | POA: Diagnosis not present

## 2023-11-20 DIAGNOSIS — J90 Pleural effusion, not elsewhere classified: Secondary | ICD-10-CM | POA: Diagnosis not present

## 2023-11-20 DIAGNOSIS — I509 Heart failure, unspecified: Secondary | ICD-10-CM

## 2023-11-20 DIAGNOSIS — I700 Atherosclerosis of aorta: Secondary | ICD-10-CM | POA: Diagnosis not present

## 2023-11-20 DIAGNOSIS — Z66 Do not resuscitate: Secondary | ICD-10-CM | POA: Diagnosis not present

## 2023-11-20 DIAGNOSIS — N261 Atrophy of kidney (terminal): Secondary | ICD-10-CM | POA: Diagnosis present

## 2023-11-20 DIAGNOSIS — F05 Delirium due to known physiological condition: Secondary | ICD-10-CM | POA: Diagnosis not present

## 2023-11-20 DIAGNOSIS — N1831 Chronic kidney disease, stage 3a: Secondary | ICD-10-CM | POA: Diagnosis present

## 2023-11-20 DIAGNOSIS — R109 Unspecified abdominal pain: Secondary | ICD-10-CM | POA: Diagnosis not present

## 2023-11-20 DIAGNOSIS — N281 Cyst of kidney, acquired: Secondary | ICD-10-CM | POA: Diagnosis not present

## 2023-11-20 DIAGNOSIS — D631 Anemia in chronic kidney disease: Secondary | ICD-10-CM | POA: Diagnosis present

## 2023-11-20 DIAGNOSIS — I5030 Unspecified diastolic (congestive) heart failure: Secondary | ICD-10-CM | POA: Diagnosis present

## 2023-11-20 DIAGNOSIS — K279 Peptic ulcer, site unspecified, unspecified as acute or chronic, without hemorrhage or perforation: Secondary | ICD-10-CM | POA: Diagnosis not present

## 2023-11-20 DIAGNOSIS — R195 Other fecal abnormalities: Secondary | ICD-10-CM | POA: Diagnosis present

## 2023-11-20 DIAGNOSIS — K219 Gastro-esophageal reflux disease without esophagitis: Secondary | ICD-10-CM | POA: Diagnosis present

## 2023-11-20 DIAGNOSIS — R54 Age-related physical debility: Secondary | ICD-10-CM | POA: Diagnosis present

## 2023-11-20 DIAGNOSIS — R911 Solitary pulmonary nodule: Secondary | ICD-10-CM | POA: Diagnosis present

## 2023-11-20 DIAGNOSIS — Z7952 Long term (current) use of systemic steroids: Secondary | ICD-10-CM

## 2023-11-20 DIAGNOSIS — J9811 Atelectasis: Secondary | ICD-10-CM | POA: Diagnosis not present

## 2023-11-20 DIAGNOSIS — I6782 Cerebral ischemia: Secondary | ICD-10-CM | POA: Diagnosis not present

## 2023-11-20 DIAGNOSIS — R0689 Other abnormalities of breathing: Secondary | ICD-10-CM | POA: Diagnosis not present

## 2023-11-20 LAB — CBC
HCT: 33.5 % — ABNORMAL LOW (ref 36.0–46.0)
Hemoglobin: 11.4 g/dL — ABNORMAL LOW (ref 12.0–15.0)
MCH: 31 pg (ref 26.0–34.0)
MCHC: 34 g/dL (ref 30.0–36.0)
MCV: 91 fL (ref 80.0–100.0)
Platelets: 381 K/uL (ref 150–400)
RBC: 3.68 MIL/uL — ABNORMAL LOW (ref 3.87–5.11)
RDW: 15.4 % (ref 11.5–15.5)
WBC: 7.9 K/uL (ref 4.0–10.5)
nRBC: 0 % (ref 0.0–0.2)

## 2023-11-20 LAB — RAPID URINE DRUG SCREEN, HOSP PERFORMED
Amphetamines: NOT DETECTED
Barbiturates: NOT DETECTED
Benzodiazepines: NOT DETECTED
Cocaine: NOT DETECTED
Opiates: NOT DETECTED
Tetrahydrocannabinol: NOT DETECTED

## 2023-11-20 LAB — C-REACTIVE PROTEIN: CRP: 7 mg/dL — ABNORMAL HIGH (ref <1.0)

## 2023-11-20 LAB — ECHOCARDIOGRAM COMPLETE
AR max vel: 2.28 cm2
AV Peak grad: 4.9 mmHg
Ao pk vel: 1.11 m/s
Height: 60 in
MV VTI: 1.78 cm2
S' Lateral: 2.6 cm
Weight: 2313.95 [oz_av]

## 2023-11-20 LAB — COMPREHENSIVE METABOLIC PANEL WITH GFR
ALT: 13 U/L (ref 0–44)
AST: 23 U/L (ref 15–41)
Albumin: 2.1 g/dL — ABNORMAL LOW (ref 3.5–5.0)
Alkaline Phosphatase: 58 U/L (ref 38–126)
Anion gap: 13 (ref 5–15)
BUN: 23 mg/dL (ref 8–23)
CO2: 23 mmol/L (ref 22–32)
Calcium: 8.1 mg/dL — ABNORMAL LOW (ref 8.9–10.3)
Chloride: 85 mmol/L — ABNORMAL LOW (ref 98–111)
Creatinine, Ser: 0.98 mg/dL (ref 0.44–1.00)
GFR, Estimated: 55 mL/min — ABNORMAL LOW (ref >60)
Glucose, Bld: 73 mg/dL (ref 70–99)
Potassium: 3.5 mmol/L (ref 3.5–5.1)
Sodium: 121 mmol/L — ABNORMAL LOW (ref 135–145)
Total Bilirubin: 0.6 mg/dL (ref 0.0–1.2)
Total Protein: 4.7 g/dL — ABNORMAL LOW (ref 6.5–8.1)

## 2023-11-20 LAB — OSMOLALITY: Osmolality: 262 mosm/kg — ABNORMAL LOW (ref 275–295)

## 2023-11-20 LAB — I-STAT CHEM 8, ED
BUN: 23 mg/dL (ref 8–23)
Calcium, Ion: 1.05 mmol/L — ABNORMAL LOW (ref 1.15–1.40)
Chloride: 84 mmol/L — ABNORMAL LOW (ref 98–111)
Creatinine, Ser: 1.2 mg/dL — ABNORMAL HIGH (ref 0.44–1.00)
Glucose, Bld: 75 mg/dL (ref 70–99)
HCT: 33 % — ABNORMAL LOW (ref 36.0–46.0)
Hemoglobin: 11.2 g/dL — ABNORMAL LOW (ref 12.0–15.0)
Potassium: 3.3 mmol/L — ABNORMAL LOW (ref 3.5–5.1)
Sodium: 121 mmol/L — ABNORMAL LOW (ref 135–145)
TCO2: 26 mmol/L (ref 22–32)

## 2023-11-20 LAB — BRAIN NATRIURETIC PEPTIDE: B Natriuretic Peptide: 441 pg/mL — ABNORMAL HIGH (ref 0.0–100.0)

## 2023-11-20 LAB — URINALYSIS, W/ REFLEX TO CULTURE (INFECTION SUSPECTED)
Bacteria, UA: NONE SEEN
Bilirubin Urine: NEGATIVE
Glucose, UA: NEGATIVE mg/dL
Hgb urine dipstick: NEGATIVE
Ketones, ur: NEGATIVE mg/dL
Nitrite: NEGATIVE
Protein, ur: NEGATIVE mg/dL
Specific Gravity, Urine: 1.025 (ref 1.005–1.030)
pH: 5 (ref 5.0–8.0)

## 2023-11-20 LAB — SEDIMENTATION RATE: Sed Rate: 14 mm/h (ref 0–22)

## 2023-11-20 LAB — TSH: TSH: 7.726 u[IU]/mL — ABNORMAL HIGH (ref 0.350–4.500)

## 2023-11-20 LAB — LIPASE, BLOOD: Lipase: 339 U/L — ABNORMAL HIGH (ref 11–51)

## 2023-11-20 LAB — AMMONIA: Ammonia: 16 umol/L (ref 9–35)

## 2023-11-20 LAB — I-STAT CG4 LACTIC ACID, ED
Lactic Acid, Venous: 1.4 mmol/L (ref 0.5–1.9)
Lactic Acid, Venous: 1.2 mmol/L (ref 0.5–1.9)

## 2023-11-20 LAB — TROPONIN I (HIGH SENSITIVITY)
Troponin I (High Sensitivity): 60 ng/L — ABNORMAL HIGH (ref <18)
Troponin I (High Sensitivity): 51 ng/L — ABNORMAL HIGH (ref <18)

## 2023-11-20 LAB — ETHANOL: Alcohol, Ethyl (B): <15 mg/dL (ref <15)

## 2023-11-20 LAB — T4, FREE: Free T4: 1.46 ng/dL — ABNORMAL HIGH (ref 0.61–1.12)

## 2023-11-20 LAB — SARS CORONAVIRUS 2 BY RT PCR: SARS Coronavirus 2 by RT PCR: POSITIVE — AB

## 2023-11-20 MED ORDER — GUAIFENESIN ER 600 MG PO TB12
600.0000 mg | ORAL_TABLET | Freq: Two times a day (BID) | ORAL | Status: DC | PRN
  Administered 2023-11-20 – 2023-11-24 (×2): 600 mg via ORAL
  Filled 2023-11-20 (×2): qty 1

## 2023-11-20 MED ORDER — PREDNISONE 5 MG PO TABS
5.0000 mg | ORAL_TABLET | Freq: Every day | ORAL | Status: DC
  Administered 2023-11-20 – 2023-11-26 (×7): 5 mg via ORAL
  Filled 2023-11-20 (×7): qty 1

## 2023-11-20 MED ORDER — EZETIMIBE 10 MG PO TABS
10.0000 mg | ORAL_TABLET | Freq: Every day | ORAL | Status: DC
  Administered 2023-11-20 – 2023-11-26 (×7): 10 mg via ORAL
  Filled 2023-11-20 (×7): qty 1

## 2023-11-20 MED ORDER — ISOSORB DINITRATE-HYDRALAZINE 20-37.5 MG PO TABS
1.0000 | ORAL_TABLET | Freq: Three times a day (TID) | ORAL | Status: DC
  Administered 2023-11-20 – 2023-11-22 (×5): 1 via ORAL
  Filled 2023-11-20 (×6): qty 1

## 2023-11-20 MED ORDER — LEVOTHYROXINE SODIUM 25 MCG PO TABS: 50.0000 ug | ORAL_TABLET | Freq: Every day | ORAL | Status: DC

## 2023-11-20 MED ORDER — ACETAMINOPHEN 325 MG PO TABS
650.0000 mg | ORAL_TABLET | Freq: Four times a day (QID) | ORAL | Status: DC | PRN
  Administered 2023-11-24: 650 mg via ORAL
  Filled 2023-11-20 (×2): qty 2

## 2023-11-20 MED ORDER — ENOXAPARIN SODIUM 40 MG/0.4ML IJ SOSY: 40.0000 mg | PREFILLED_SYRINGE | INTRAMUSCULAR | Status: DC

## 2023-11-20 MED ORDER — HEPARIN BOLUS VIA INFUSION
3500.0000 [IU] | Freq: Once | INTRAVENOUS | Status: DC
  Filled 2023-11-20: qty 3500

## 2023-11-20 MED ORDER — ALBUTEROL SULFATE (2.5 MG/3ML) 0.083% IN NEBU: 2.5000 mg | INHALATION_SOLUTION | Freq: Four times a day (QID) | RESPIRATORY_TRACT | Status: DC | PRN

## 2023-11-20 MED ORDER — PANTOPRAZOLE SODIUM 40 MG PO TBEC
40.0000 mg | DELAYED_RELEASE_TABLET | Freq: Two times a day (BID) | ORAL | Status: DC
  Administered 2023-11-20 – 2023-11-26 (×13): 40 mg via ORAL
  Filled 2023-11-20 (×13): qty 1

## 2023-11-20 MED ORDER — FUROSEMIDE 10 MG/ML IJ SOLN
40.0000 mg | Freq: Once | INTRAMUSCULAR | Status: AC
  Administered 2023-11-20: 40 mg via INTRAVENOUS
  Filled 2023-11-20: qty 4

## 2023-11-20 MED ORDER — HEPARIN (PORCINE) 25000 UT/250ML-% IV SOLN
900.0000 [IU]/h | INTRAVENOUS | Status: DC
  Administered 2023-11-20: 900 [IU]/h via INTRAVENOUS
  Filled 2023-11-20 (×2): qty 250

## 2023-11-20 MED ORDER — IOHEXOL 350 MG/ML SOLN
75.0000 mL | Freq: Once | INTRAVENOUS | Status: AC | PRN
Start: 2023-11-20 — End: 2023-11-20
  Administered 2023-11-20: 75 mL via INTRAVENOUS

## 2023-11-20 MED ORDER — METOPROLOL SUCCINATE ER 25 MG PO TB24
25.0000 mg | ORAL_TABLET | Freq: Every day | ORAL | Status: DC
  Administered 2023-11-20 – 2023-11-26 (×6): 25 mg via ORAL
  Filled 2023-11-20 (×7): qty 1

## 2023-11-20 MED ORDER — FOLIC ACID 1 MG PO TABS
1.0000 mg | ORAL_TABLET | Freq: Two times a day (BID) | ORAL | Status: DC
  Administered 2023-11-20 – 2023-11-26 (×13): 1 mg via ORAL
  Filled 2023-11-20 (×13): qty 1

## 2023-11-20 MED ORDER — SODIUM CHLORIDE 0.9% FLUSH
3.0000 mL | Freq: Two times a day (BID) | INTRAVENOUS | Status: DC
  Administered 2023-11-20 – 2023-11-26 (×10): 3 mL via INTRAVENOUS

## 2023-11-20 MED ORDER — POTASSIUM CHLORIDE 20 MEQ PO PACK
40.0000 meq | PACK | Freq: Two times a day (BID) | ORAL | Status: DC
  Administered 2023-11-20 – 2023-11-25 (×12): 40 meq via ORAL
  Filled 2023-11-20 (×13): qty 2

## 2023-11-20 MED ORDER — HEPARIN (PORCINE) 25000 UT/250ML-% IV SOLN
550.0000 [IU]/h | INTRAVENOUS | Status: DC
  Administered 2023-11-20: 900 [IU]/h via INTRAVENOUS
  Administered 2023-11-23: 550 [IU]/h via INTRAVENOUS
  Filled 2023-11-20 (×3): qty 250

## 2023-11-20 MED ORDER — ACETAMINOPHEN 650 MG RE SUPP: 650.0000 mg | Freq: Four times a day (QID) | RECTAL | Status: DC | PRN

## 2023-11-20 MED ORDER — FUROSEMIDE 10 MG/ML IJ SOLN
40.0000 mg | Freq: Two times a day (BID) | INTRAMUSCULAR | Status: DC
  Administered 2023-11-20 – 2023-11-23 (×6): 40 mg via INTRAVENOUS
  Filled 2023-11-20 (×6): qty 4

## 2023-11-20 MED ORDER — LEVOTHYROXINE SODIUM 25 MCG PO TABS
25.0000 ug | ORAL_TABLET | Freq: Every day | ORAL | Status: DC
  Administered 2023-11-21 – 2023-11-26 (×6): 25 ug via ORAL
  Filled 2023-11-20 (×6): qty 1

## 2023-11-20 NOTE — Progress Notes (Addendum)
 PHARMACY - ANTICOAGULATION CONSULT NOTE  Pharmacy Consult for heparin  Indication: pulmonary embolus and DVT  Allergies  Allergen Reactions   Coreg  [Carvedilol ] Diarrhea   Lorazepam Diarrhea    Patient Measurements:    Vital Signs: Temp: 98 F (36.7 C) (09/12 1032) BP: 154/86 (09/12 0703) Pulse Rate: 60 (09/12 1115)  Labs: Recent Labs    11/20/23 0815 11/20/23 1004 11/20/23 1005 11/20/23 1016  HGB 11.4*  --   --  11.2*  HCT 33.5*  --   --  33.0*  PLT 381  --   --   --   CREATININE  --  0.98  --  1.20*  TROPONINIHS 60*  --  51*  --     Estimated Creatinine Clearance: 25.5 mL/min (A) (by C-G formula based on SCr of 1.2 mg/dL (H)).   Medical History: Past Medical History:  Diagnosis Date   Allergy    seasonal   Anemia    Arthritis    Encounter for care of pacemaker 07/12/2020   GERD (gastroesophageal reflux disease)    Heart block AV complete (HCC) 07/11/2020   Hyperlipidemia    Hypertension    Pacemaker Abbott Assurity MRI model [id number] 07/12/2020 07/12/2020    Medications:  See electronic med rec  Assessment: 89 y.o. F presents after choking on a.m. meds and now with AMS. Noted pt with recent admission for anemia- worked up for presumed GIB - EGD showed nonbleeding gastric and duodenal ulcer. CT today shows single small segmental pulmonary embolus in the right lower lobe. No findings of right heart strain. To start heparin . Hgb 11.2 on admission. No AC PTA.  Update: Heparin  was d/c shortly afterward, however now doppler with new DVT and pt agreeable to heparin  so will resume  Goal of Therapy:  Heparin  level 0.3-0.7 units/ml Monitor platelets by anticoagulation protocol: Yes   Plan:  Restart heparin  gtt at 900 units/hr F/u 8 hour heparin  level F/u long term AC plan  Dorn Poot, PharmD, [facility] Clinical Pharmacist ED Pharmacist Phone # [phone] 11/20/2023 3:12 PM

## 2023-11-20 NOTE — Progress Notes (Signed)
 LUE venous duplex has been completed.  Preliminary results given to Dr. Francesca.    Results can be found under chart review under CV PROC. 11/20/2023 9:51 AM Eustacio Ellen RVT, RDMS

## 2023-11-20 NOTE — ED Triage Notes (Signed)
 Pt arrived from home with [facility] . Pt's aide called EMS after patient choked on her morning meds. On EMS arrival, pt had patent airway. Aide requesting pt be sent to ER for eval. Pt confused on arrival; c/o leg pain however cannot recall any injuries    Pt refused blood pressure enroute  Pulse 70  97%RA   Family at bedside: reports pt has been talking out of head for the past 3-4 days, worse yesterday. Confusion has been increasing worse since discharge from hospital

## 2023-11-20 NOTE — Progress Notes (Signed)
 Bilateral lower extremity venous duplex has been completed. Preliminary results can be found in CV Proc through chart review.  Results were given to Dr. Claudene.  11/20/23 2:59 PM Cathlyn Collet RVT

## 2023-11-20 NOTE — Progress Notes (Signed)
 Echocardiogram 2D Echocardiogram has been performed.  Pamela Dunn 11/20/2023, 5:53 PM

## 2023-11-20 NOTE — ED Notes (Signed)
 Patient was in the hospital 09/1 after being admitted for GI bleed , had 2 units of blood at that time. Left hand very swollen

## 2023-11-20 NOTE — Progress Notes (Signed)
 Pt refuses to get blood drawn for BMP at this time. Phlebotomist will try again later.  Wendi Dash, RN

## 2023-11-20 NOTE — ED Notes (Signed)
 Patient transported to CT

## 2023-11-20 NOTE — ED Provider Notes (Signed)
 [facility] AT [facility] Provider Note  CSN: [id number] Arrival date & time: 11/20/23 0555  Chief Complaint(s) No chief complaint on file.  HPI Pamela Dunn is a 89 y.o. female history of hypertension, hyperlipidemia, CHF, rheumatoid arthritis, recent admission for possible GI bleed presenting to the emergency department with confusion.  Patient's family reports that she has been progressively more confused since leaving the hospital.  Have also noticed that she has developed swelling to the left upper extremity.  Have been giving her Lasix .  Still having some dark stool.  Have not noticed any fevers or chills.  No vomiting.  Has been having cough.  This morning patient choked on her meds and was unable to get them down.  Has home health aide at home.  Patient reports that her stomach hurts but denies any complaints but also very confused.  No falls, but does have a bruise to the bridge of the nose.  Patient's daughter-in-law reports that the patient hit her nose against her caregivers collarbone. She witness History limited due to confusion   Past Medical History Past Medical History:  Diagnosis Date   Allergy    seasonal   Anemia    Arthritis    Encounter for care of pacemaker 07/12/2020   GERD (gastroesophageal reflux disease)    Heart block AV complete (HCC) 07/11/2020   Hyperlipidemia    Hypertension    Pacemaker [employer] Assurity MRI model [id number] 07/12/2020 07/12/2020   Patient Active Problem List   Diagnosis Date Noted   Pleural effusion 11/20/2023   Gastric ulcer 11/09/2023   Duodenal ulcer 11/09/2023   Acute blood loss anemia (ABLA) 11/09/2023   GIB (gastrointestinal bleeding) 11/07/2023   Hypoglycemia 01/07/2023   Incidental pulmonary nodule 01/07/2023   Fracture of upper arm 01/07/2023   Colitis 01/06/2023   Pacemaker Abbott Assurity MRI model [id number] 07/12/2020 07/12/2020   Encounter for care of pacemaker 07/12/2020   Heart block AV  complete (HCC) 07/11/2020   Pressure injury of skin 08/27/2019   Hypothyroidism 08/23/2019   Chronic diastolic CHF (congestive heart failure) (HCC) 08/23/2019   SBO (small bowel obstruction) (HCC) 08/17/2019   Multiple pulmonary nodules 06/19/2017   Metatarsalgia of left foot 07/13/2014   Hx of adenomatous colonic polyps 09/05/2013   Spinal stenosis of lumbar region 09/05/2013   Lumbar disc disease 09/05/2013   Hyperlipidemia 10/23/2009   GERD 10/23/2009   Rheumatoid arthritis (HCC) 10/23/2009   ABDOMINAL PAIN, UNSPECIFIED SITE 10/23/2009   ANEMIA, HX OF 10/23/2009   PEPTIC ULCER DISEASE, HX OF 10/23/2009   Home Medication(s) Prior to Admission medications   Medication Sig Start Date End Date Taking? Authorizing Provider  cholecalciferol  (VITAMIN D ) 1000 UNITS tablet Take 1,000 Units by mouth daily.    Yes [provider]  ezetimibe  (ZETIA ) 10 MG tablet Take 10 mg by mouth daily.    Yes [provider]  folic acid  (FOLVITE ) 1 MG tablet Take 1 mg by mouth 2 (two) times daily. 02/09/23  Yes [provider]  furosemide  (LASIX ) 20 MG tablet TAKE 1 TABLET BY MOUTH EVERY DAY AS NEEDED Patient taking differently: Take 20 mg by mouth daily. 07/14/23  Yes Wyn Jackee VEAR Mickey., NP  Glycerin , Laxative, (ADULT SUPPOSITORY RE) Place 1 Dose rectally daily as needed (Constipation).   Yes [provider]  guaiFENesin  (MUCINEX ) 600 MG 12 hr tablet Take 600 mg by mouth 2 (two) times daily as needed for cough or to loosen phlegm.   Yes  [provider]  isosorbide -hydrALAZINE  (BIDIL ) 20-37.5 MG tablet Take 1 tablet by mouth 3 (three) times daily. 05/01/23  Yes Ladona Heinz, MD  levothyroxine  (SYNTHROID ) 25 MCG tablet Take 25 mcg by mouth daily.    Yes [provider]  metoprolol  succinate (TOPROL -XL) 25 MG 24 hr tablet Take 1 tablet (25 mg total) by mouth daily. 04/07/23  Yes Ladona Heinz, MD  Multiple Vitamins-Minerals (PRESERVISION AREDS PO) Take 1 Dose by mouth  2 (two) times daily.   Yes [provider]  Omega-3 Fatty Acids (OMEGA-3 FISH OIL PO) Take 1 capsule by mouth in the morning and at bedtime.   Yes [provider]  omeprazole  (PRILOSEC) 40 MG capsule Take 1 capsule (40 mg total) by mouth 2 (two) times daily for 30 days, THEN 1 capsule (40 mg total) daily. 11/09/23 01/08/24 Yes Pahwani, Ravi, MD  polyethylene glycol (MIRALAX  / GLYCOLAX ) 17 g packet Take 17 g by mouth daily as needed for moderate constipation.   Yes [provider]  predniSONE  (DELTASONE ) 5 MG tablet Take 5 mg by mouth daily. 11/03/18  Yes [provider]  sodium bicarbonate  650 MG tablet Take 650 mg by mouth 2 (two) times daily. Patient not taking: Reported on 11/20/2023    [provider]                                                                                                                                    Past Surgical History Past Surgical History:  Procedure Laterality Date   APPENDECTOMY     BACK SURGERY     BOWEL RESECTION N/A 08/24/2019   Procedure: SMALL BOWEL RESECTION;  Surgeon: Vernetta Berg, MD;  Location: [facility] ORS;  Service: General;  Laterality: N/A;   ESOPHAGOGASTRODUODENOSCOPY N/A 11/08/2023   Procedure: EGD (ESOPHAGOGASTRODUODENOSCOPY);  Surgeon: Rollin Dover, MD;  Location: [facility] ENDOSCOPY;  Service: Gastroenterology;  Laterality: N/A;   LAPAROSCOPY N/A 08/24/2019   Procedure: LAPAROSCOPY DIAGNOSTIC;  Surgeon: Vernetta Berg, MD;  Location: [facility] ORS;  Service: General;  Laterality: N/A;   LAPAROTOMY N/A 08/24/2019   Procedure: EXPLORATORY LAPAROTOMY;  Surgeon: Vernetta Berg, MD;  Location: [facility] ORS;  Service: General;  Laterality: N/A;   PACEMAKER IMPLANT N/A 07/12/2020   Procedure: PACEMAKER IMPLANT;  Surgeon: Kelsie Agent, MD;  Location: [facility] INVASIVE CV LAB;  Service: Cardiovascular;  Laterality: N/A;   Family History Family History  Problem Relation Age of Onset   Cancer Mother    Cancer - Colon Sister     Heart disease Sister    Brain cancer Brother    Cancer - Colon Brother    Cancer - Colon Brother    Breast cancer Maternal Aunt     Social History Social History   Tobacco Use   Smoking status: Never   Smokeless tobacco: Never  Vaping Use   Vaping status: Never Used  Substance Use Topics   Alcohol  use: No  Drug use: No   Allergies Coreg  [carvedilol ] and Lorazepam  Review of Systems Review of Systems  All other systems reviewed and are negative.   Physical Exam Vital Signs  I have reviewed the triage vital signs BP (!) 154/86 (BP Location: Right Arm)   Pulse 60   Temp 98 F (36.7 C)   Resp 18   SpO2 98%  Physical Exam Vitals and nursing note reviewed.  Constitutional:      General: She is not in acute distress.    Appearance: She is well-developed. She is ill-appearing.  HENT:     Head: Normocephalic.     Comments: Small bruise over bridge of nose    Mouth/Throat:     Mouth: Mucous membranes are moist.  Eyes:     Pupils: Pupils are equal, round, and reactive to light.  Cardiovascular:     Rate and Rhythm: Normal rate and regular rhythm.     Heart sounds: No murmur heard. Pulmonary:     Effort: Pulmonary effort is normal. No respiratory distress.     Breath sounds: Rales (bibasilar, L>R) present.  Abdominal:     General: Abdomen is flat.     Palpations: Abdomen is soft.     Tenderness: There is abdominal tenderness (generalized).  Musculoskeletal:        General: No tenderness.     Comments: 1+ bilateral lower extremity edema.  No right upper extremity edema.  Left upper extremity with 4+ pitting edema, no warmth.  2+ distal radial pulse bilaterally  Skin:    General: Skin is warm and dry.  Neurological:     Mental Status: She is alert.     Comments: Moves all 4 extremities, no cranial nerve deficit.  Oriented to time, but states her name is Cheryl, says she is here to see [employer], does not know where she is currently.  Follows commands equally in  all 4 extremities  Psychiatric:        Mood and Affect: Mood normal.        Behavior: Behavior normal.     ED Results and Treatments Labs (all labs ordered are listed, but only abnormal results are displayed) Labs Reviewed  SARS CORONAVIRUS 2 BY RT PCR - Abnormal; Notable for the following components:      Result Value   SARS Coronavirus 2 by RT PCR POSITIVE (*)    All other components within normal limits  CBC - Abnormal; Notable for the following components:   RBC 3.68 (*)    Hemoglobin 11.4 (*)    HCT 33.5 (*)    All other components within normal limits  TSH - Abnormal; Notable for the following components:   TSH 7.726 (*)    All other components within normal limits  URINALYSIS, W/ REFLEX TO CULTURE (INFECTION SUSPECTED) - Abnormal; Notable for the following components:   Leukocytes,Ua TRACE (*)    All other components within normal limits  BRAIN NATRIURETIC PEPTIDE - Abnormal; Notable for the following components:   B Natriuretic Peptide 441.0 (*)    All other components within normal limits  COMPREHENSIVE METABOLIC PANEL WITH GFR - Abnormal; Notable for the following components:   Sodium 121 (*)    Chloride 85 (*)    Calcium  8.1 (*)    Total Protein 4.7 (*)    Albumin  2.1 (*)    GFR, Estimated 55 (*)    All other components within normal limits  LIPASE, BLOOD - Abnormal; Notable for the following components:  Lipase 339 (*)    All other components within normal limits  I-STAT CHEM 8, ED - Abnormal; Notable for the following components:   Sodium 121 (*)    Potassium 3.3 (*)    Chloride 84 (*)    Creatinine, Ser 1.20 (*)    Calcium , Ion 1.05 (*)    Hemoglobin 11.2 (*)    HCT 33.0 (*)    All other components within normal limits  TROPONIN I (HIGH SENSITIVITY) - Abnormal; Notable for the following components:   Troponin I (High Sensitivity) 60 (*)    All other components within normal limits  TROPONIN I (HIGH SENSITIVITY) - Abnormal; Notable for the following  components:   Troponin I (High Sensitivity) 51 (*)    All other components within normal limits  CULTURE, BLOOD (ROUTINE X 2)  CULTURE, BLOOD (ROUTINE X 2)  AMMONIA  ETHANOL  RAPID URINE DRUG SCREEN, HOSP PERFORMED  T4, FREE  SODIUM, URINE, RANDOM  OSMOLALITY, URINE  OSMOLALITY  SEDIMENTATION RATE  C-REACTIVE PROTEIN  BASIC METABOLIC PANEL WITH GFR  I-STAT CG4 LACTIC ACID, ED  I-STAT CG4 LACTIC ACID, ED                                                                                                                          Radiology CT Angio Chest Pulmonary Embolism (PE) W or WO Contrast Result Date: 11/20/2023 CLINICAL DATA:  Pulmonary embolism (PE) suspected, high prob EXAM: CT ANGIOGRAPHY CHEST WITH CONTRAST TECHNIQUE: Multidetector CT imaging of the chest was performed using the standard protocol during bolus administration of intravenous contrast. Multiplanar CT image reconstructions and MIPs were obtained to evaluate the vascular anatomy. RADIATION DOSE REDUCTION: This exam was performed according to the departmental dose-optimization program which includes automated exposure control, adjustment of the mA and/or kV according to patient size and/or use of iterative reconstruction technique. CONTRAST:  75mL OMNIPAQUE  IOHEXOL  350 MG/ML SOLN COMPARISON:  09/14/2023, 03/06/2023 FINDINGS: Pulmonary Embolism: Single segmental pulmonary embolus within the right lower lobe. The RV-LV ratio is less than 1. No findings of right heart strain. Cardiovascular: No cardiomegaly or pericardial effusion.Left chest pacemaker/AICD with leads terminating in the right atrium and right ventricle.No aortic aneurysm. Diffuse aortic and dense multi-vessel coronary atherosclerosis. Mediastinum/Nodes: No mediastinal mass.Small hiatal hernia.No mediastinal, hilar, or axillary lymphadenopathy. Lungs/Pleura: The midline trachea and bronchi are patent. Biapical pleuroparenchymal scarring. Moderate volume left pleural  effusion, which may be partially loculated in the more superior aspects, with near complete compressive atelectasis of the left lower lobe. Small right pleural effusion. No pneumothorax. Unchanged posterior right lung apex nodules, measuring 4 mm and 6 mm respectively (axial 45 and 50). Lobular nodule in the posterior right upper lobe, measuring 1.4 x 1.1 cm (axial 62), increased in size in the interim, measuring 6 mm previously. Unchanged 4.5 mm anterobasal right lower lobe nodule (axial 106). Musculoskeletal: Diffuse osteopenia. No acute fracture or destructive bone lesion. Moderate osteoarthritis of the right glenohumeral joint. Left shoulder arthroplasty  is anatomically aligned without dislocation. Multilevel degenerative disc disease of the spine. Upper Abdomen: No acute abnormality in the partially visualized upper abdomen. Review of the MIP images confirms the above findings. IMPRESSION: 1. Single small segmental pulmonary embolus in the right lower lobe. No findings of right heart strain. 2. Moderate volume left pleural effusion, which may be partially loculated in the more superior aspect. Dense consolidation filling the majority of the left lower lobe, favored to represent near complete compressive atelectasis. 3. Lobular nodule in the posterior right upper lobe (axial 62), measuring 1.4 x 1.1 cm. This has increased in size since March 06, 2023, worrisome for enlarging neoplasm. Critical Value/emergent results were called by telephone at the time of interpretation on 11/20/2023 at 11:05 am to provider [facility] , who verbally acknowledged these results. Aortic Atherosclerosis (ICD10-I70.0). Electronically Signed   By: Rogelia Myers M.D.   On: 11/20/2023 11:11   UE VENOUS DUPLEX (7am - 7pm) Result Date: 11/20/2023 UPPER VENOUS STUDY  Patient Name:  TAVONNA WORTHINGTON  Date of Exam:   11/20/2023 Medical Rec #: [id number]      Accession #:    [id number] Date of Birth: [birth year]      Patient Gender: F  Patient Age:   27 years Exam Location:  [facility] Procedure:      VAS US  UPPER EXTREMITY VENOUS DUPLEX Referring Phys: ELSIE BODY --------------------------------------------------------------------------------  Indications: Edema Other Indications: Recent hospitalization with LUE IV. Risk Factors: DVT LUE (02/04/2023). Limitations: Poor ultrasound/tissue interface and patient unable to cooperate due to altered mental status, diffuse subcutaneous edema. Comparison Study: Previous exam on 06/05/2023 was negative for DVT Performing Technologist: Ezzie Potters RVT, RDMS  Examination Guidelines: A complete evaluation includes B-mode imaging, spectral Doppler, color Doppler, and power Doppler as needed of all accessible portions of each vessel. Bilateral testing is considered an integral part of a complete examination. Limited examinations for reoccurring indications may be performed as noted.  Right Findings: +----------+------------+---------+-----------+----------+--------------------+ RIGHT     CompressiblePhasicitySpontaneousProperties      Summary        +----------+------------+---------+-----------+----------+--------------------+ Subclavian               Yes       Yes                   patent by                                                              color/doppler     +----------+------------+---------+-----------+----------+--------------------+  Left Findings: +----------+------------+---------+-----------+----------+--------------------+ LEFT      CompressiblePhasicitySpontaneousProperties      Summary        +----------+------------+---------+-----------+----------+--------------------+ IJV                      Yes       Yes                   patent by  color/doppler     +----------+------------+---------+-----------+----------+--------------------+ Subclavian               Yes       Yes                    patent by                                                              color/doppler     +----------+------------+---------+-----------+----------+--------------------+ Axillary      Full       Yes       Yes                                   +----------+------------+---------+-----------+----------+--------------------+ Brachial      Full       Yes       Yes                                   +----------+------------+---------+-----------+----------+--------------------+ Radial                                                 Not visualized    +----------+------------+---------+-----------+----------+--------------------+ Ulnar                                                  Not visualized    +----------+------------+---------+-----------+----------+--------------------+ Cephalic                                               Not visualized    +----------+------------+---------+-----------+----------+--------------------+ Basilic       Full       Yes       Yes                                   +----------+------------+---------+-----------+----------+--------------------+  Summary:  Right: No evidence of thrombosis in the subclavian.  Left: Limited exam due to above stated limitations, negative for DVT and SVT in all areas visualized.  *See table(s) above for measurements and observations.  Diagnosing physician: Debby Robertson Electronically signed by Debby Robertson on 11/20/2023 at 11:05:18 AM.    Final    CT ABDOMEN PELVIS W CONTRAST Result Date: 11/20/2023 CLINICAL DATA:  Acute generalized abdominal pain. EXAM: CT ABDOMEN AND PELVIS WITH CONTRAST TECHNIQUE: Multidetector CT imaging of the abdomen and pelvis was performed using the standard protocol following bolus administration of intravenous contrast. RADIATION DOSE REDUCTION: This exam was performed according to the departmental dose-optimization program which includes automated exposure  control, adjustment of the mA and/or kV according to patient size and/or use of iterative reconstruction technique. CONTRAST:  75mL OMNIPAQUE  IOHEXOL  350 MG/ML SOLN COMPARISON:  November 07, 2023.  FINDINGS: Hepatobiliary: No focal liver abnormality is seen. No gallstones, gallbladder wall thickening, or biliary dilatation. Pancreas: No acute inflammation is noted. Stable ductal dilatation is noted in body of pancreas. Spleen: Normal in size without focal abnormality. Adrenals/Urinary Tract: Adrenal glands appear normal. Mild bilateral renal atrophy is noted. Left renal cyst is noted. No hydronephrosis or renal obstruction is noted. Urinary bladder is unremarkable. Stomach/Bowel: The stomach is unremarkable. Status post appendectomy. No evidence of bowel obstruction or inflammation. Vascular/Lymphatic: Aortic atherosclerosis. No enlarged abdominal or pelvic lymph nodes. Reproductive: Uterus and bilateral adnexa are unremarkable. Other: No ascites or hernia is noted. Musculoskeletal: No acute or significant osseous findings. IMPRESSION: 1. Stable ductal dilatation is noted in body of pancreas. 2. Mild bilateral renal atrophy. 3. No acute abnormality seen in the abdomen or pelvis. 4. Aortic atherosclerosis. Aortic Atherosclerosis (ICD10-I70.0). Electronically Signed   By: Lynwood Landy Raddle M.D.   On: 11/20/2023 11:03   CT Head Wo Contrast Result Date: 11/20/2023 EXAM: CT HEAD WITHOUT CONTRAST 11/20/2023 10:48:00 AM TECHNIQUE: CT of the head was performed without the administration of intravenous contrast. Automated exposure control, iterative reconstruction, and/or weight based adjustment of the mA/kV was utilized to reduce the radiation dose to as low as reasonably achievable. COMPARISON: 01/06/2023 CLINICAL HISTORY: Memory loss; Mental status change, unknown cause. Pt arrived from home with [facility]. Pt's aide called EMS after patient choked on her morning meds. On EMS arrival, pt had patent airway. Aide requesting pt be  sent to ER for eval. Pt confused on arrival; c/o leg pain however cannot recall any injuries. FINDINGS: BRAIN AND VENTRICLES: Nonspecific hypoattenuation in the periventricular and subcortical white matter, most likely representing chronic microvascular ischemic changes. Mild generalized parenchymal volume loss. 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 ORBITS: Bilaterally lateral lens replacement. SINUSES: Complete opacification of the visualized left maxillary sinus. SOFT TISSUES AND SKULL: No acute soft tissue abnormality. No skull fracture. Bilateral mastoid effusions, left greater than right. Degenerative changes of the mandibular condyle, greater on the left. Cerclage wires noted along the posterior elements of C1 and C2. VASCULATURE: Atherosclerosis of the carotid siphons and intracranial vertebral arteries. IMPRESSION: 1. No acute intracranial abnormality. 2. Moderate chronic microvascular ischemic changes. 3. Mild generalized parenchymal volume loss without lobar specific atrophy. 4. 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 Electronically signed by: Donnice Mania MD 11/20/2023 10:59 AM EDT RP Workstation: [id number]   DG Chest Portable 1 View Result Date: 11/20/2023 CLINICAL DATA:  89 year old female with shortness of breath and possible aspiration. EXAM: PORTABLE CHEST 1 VIEW COMPARISON:  CTA abdomen 11/07/2023.  Portable chest 01/06/2023. FINDINGS: Portable AP view at 0641 hours. Advanced Calcified aortic atherosclerosis. Chronic left chest dual lead cardiac pacemaker. Stable cardiac size and mediastinal contours. Densely opacified left lung base, new since portable exam last year, and appears related to progressive left pleural effusion since the CTA last month (small layering effusion at that time). No air bronchograms. No superimposed pneumothorax or pulmonary edema. Right lung appears stable, negative. Paucity of bowel gas in the upper  abdomen. Stable visualized osseous structures. IMPRESSION: 1. Progressed Left pleural effusion since CTA last month, now moderate. 2. No other acute cardiopulmonary abnormality. Aortic Atherosclerosis (ICD10-I70.0). Electronically Signed   By: VEAR Hurst M.D.   On: 11/20/2023 06:58    Pertinent labs & imaging results that were available during my care of the patient were reviewed by me and considered in my medical decision making (see MDM for details).  Medications Ordered in ED Medications  furosemide  (LASIX ) injection 40 mg (has no administration in time range)  potassium chloride  (KLOR-CON ) packet 40 mEq (has no administration in time range)  levothyroxine  (SYNTHROID ) tablet 50 mcg (has no administration in time range)  sodium chloride  flush (NS) 0.9 % injection 3 mL (has no administration in time range)  acetaminophen  (TYLENOL ) tablet 650 mg (has no administration in time range)    Or  acetaminophen  (TYLENOL ) suppository 650 mg (has no administration in time range)  albuterol  (PROVENTIL ) (2.5 MG/3ML) 0.083% nebulizer solution 2.5 mg (has no administration in time range)  iohexol  (OMNIPAQUE ) 350 MG/ML injection 75 mL (75 mLs Intravenous Contrast Given 11/20/23 1036)                                                                                                                                     Procedures .Critical Care  Performed by: Francesca Elsie CROME, MD Authorized by: Francesca Elsie CROME, MD   Critical care provider statement:    Critical care time (minutes):  30   Critical care was necessary to treat or prevent imminent or life-threatening deterioration of the following conditions:  Metabolic crisis and cardiac failure   Critical care was time spent personally by me on the following activities:  Development of treatment plan with patient or surrogate, discussions with consultants, evaluation of patient's response to treatment, examination of patient, ordering and review of laboratory  studies, ordering and review of radiographic studies, ordering and performing treatments and interventions, pulse oximetry, re-evaluation of patient's condition and review of old charts   Care discussed with: admitting provider     (including critical care time)  Medical Decision Making / ED Course   MDM:  89 year old presenting to the emergency department with confusion.  Examination notable for left upper extremity swelling.  Patient also has basilar crackles, mild diffuse abdominal tenderness.  Differential includes intracranial process.  No falls but apparently struck head against caregiver.  Will obtain CT head.  No apparent focal deficit.  Differential also includes toxic or metabolic process.  Will check labs including CMP, ammonia, TSH.  Differential also includes infectious process.  Will check urinalysis.  She does have some crackles, chest x-ray with pleural effusion, will obtain CT for further analysis.  She also has abdominal tenderness, may have intra-abdominal process.  Will obtain CT abdomen check labs for this also.  Also obtain urinalysis.  Examination also notable for marked swelling to the left upper extremity.  Distal circulation intact.  Significantly out of proportion compared to other extremities.  Will obtain ultrasound to evaluate for underlying DVT.  Did have previous left upper extremity DVT which had resolved on last ultrasound  Clinical Course as of 11/20/23 1215  Fri Nov 20, 2023  1152 Workup notable for multiple abnormalities.  Labs notable for hyponatremia, elevated BNP.  Patient does appear somewhat volume overloaded, suspect hyponatremia more likely due to volume  overload than hypovolemia or SIADH.  Will give dose of Lasix .  TSH mildly elevated, will add on T4.  Patient's COVID test is also positive which likely explains patient's cough and some of her encephalopathy.  CT angio chest shows very small pulmonary embolism, single embolism, patient is recently  admitted for GI bleed, does not have any tachycardia, hypoxia, hypotension, so we will defer treatment to hospitalist.  Troponin mildly elevated likely demand.  CT chest also shows lung nodule which is worrisome for malignancy.  Discussed with family, since she is 9 they do not want to pursue this any further.  Given complicated presentation patient will need admission [WS]  1213 Patient has been admitted by Dr. Claudene [WS]    Clinical Course User Index [WS] Francesca Elsie CROME, MD     Additional history obtained: -Additional history obtained from family and ems -External records from outside source obtained and reviewed including: Chart review including previous notes, labs, imaging, consultation notes including prior admission   Lab Tests: -I ordered, reviewed, and interpreted labs.   The pertinent results include:   Labs Reviewed  SARS CORONAVIRUS 2 BY RT PCR - Abnormal; Notable for the following components:      Result Value   SARS Coronavirus 2 by RT PCR POSITIVE (*)    All other components within normal limits  CBC - Abnormal; Notable for the following components:   RBC 3.68 (*)    Hemoglobin 11.4 (*)    HCT 33.5 (*)    All other components within normal limits  TSH - Abnormal; Notable for the following components:   TSH 7.726 (*)    All other components within normal limits  URINALYSIS, W/ REFLEX TO CULTURE (INFECTION SUSPECTED) - Abnormal; Notable for the following components:   Leukocytes,Ua TRACE (*)    All other components within normal limits  BRAIN NATRIURETIC PEPTIDE - Abnormal; Notable for the following components:   B Natriuretic Peptide 441.0 (*)    All other components within normal limits  COMPREHENSIVE METABOLIC PANEL WITH GFR - Abnormal; Notable for the following components:   Sodium 121 (*)    Chloride 85 (*)    Calcium  8.1 (*)    Total Protein 4.7 (*)    Albumin  2.1 (*)    GFR, Estimated 55 (*)    All other components within normal limits  LIPASE,  BLOOD - Abnormal; Notable for the following components:   Lipase 339 (*)    All other components within normal limits  I-STAT CHEM 8, ED - Abnormal; Notable for the following components:   Sodium 121 (*)    Potassium 3.3 (*)    Chloride 84 (*)    Creatinine, Ser 1.20 (*)    Calcium , Ion 1.05 (*)    Hemoglobin 11.2 (*)    HCT 33.0 (*)    All other components within normal limits  TROPONIN I (HIGH SENSITIVITY) - Abnormal; Notable for the following components:   Troponin I (High Sensitivity) 60 (*)    All other components within normal limits  TROPONIN I (HIGH SENSITIVITY) - Abnormal; Notable for the following components:   Troponin I (High Sensitivity) 51 (*)    All other components within normal limits  CULTURE, BLOOD (ROUTINE X 2)  CULTURE, BLOOD (ROUTINE X 2)  AMMONIA  ETHANOL  RAPID URINE DRUG SCREEN, HOSP PERFORMED  T4, FREE  SODIUM, URINE, RANDOM  OSMOLALITY, URINE  OSMOLALITY  SEDIMENTATION RATE  C-REACTIVE PROTEIN  BASIC METABOLIC PANEL WITH GFR  I-STAT CG4  LACTIC ACID, ED  I-STAT CG4 LACTIC ACID, ED    Notable for see mdm  EKG   EKG Interpretation Date/Time:  Friday November 20 2023 07:33:13 EDT Ventricular Rate:  62 PR Interval:  215 QRS Duration:  207 QT Interval:  531 QTC Calculation: 540 R Axis:   -84  Text Interpretation: AV dual-paced rhythm Confirmed by Francesca Fallow ([id number]) on 11/20/2023 7:34:50 AM         Imaging Studies ordered: I ordered imaging studies including CT head, CT c/a/P On my interpretation imaging demonstrates tiny PE, pleural effusion, lung nodule  I independently visualized and interpreted imaging. I agree with the radiologist interpretation   Medicines ordered and prescription drug management: Meds ordered this encounter  Medications   iohexol  (OMNIPAQUE ) 350 MG/ML injection 75 mL   furosemide  (LASIX ) injection 40 mg   potassium chloride  (KLOR-CON ) packet 40 mEq   levothyroxine  (SYNTHROID ) tablet 50 mcg   sodium  chloride flush (NS) 0.9 % injection 3 mL   OR Linked Order Group    acetaminophen  (TYLENOL ) tablet 650 mg    acetaminophen  (TYLENOL ) suppository 650 mg   albuterol  (PROVENTIL ) (2.5 MG/3ML) 0.083% nebulizer solution 2.5 mg    -I have reviewed the patients home medicines and have made adjustments as needed   Consultations Obtained: I requested consultation with the hospitalist,  and discussed lab and imaging findings as well as pertinent plan - they recommend: admission   Cardiac Monitoring: The patient was maintained on a cardiac monitor.  I personally viewed and interpreted the cardiac monitored which showed an underlying rhythm of: Paced  Social Determinants of Health:  Diagnosis or treatment significantly limited by social determinants of health: lives alone   Reevaluation: After the interventions noted above, I reevaluated the patient and found that their symptoms have improved  Co morbidities that complicate the patient evaluation  Past Medical History:  Diagnosis Date   Allergy    seasonal   Anemia    Arthritis    Encounter for care of pacemaker 07/12/2020   GERD (gastroesophageal reflux disease)    Heart block AV complete (HCC) 07/11/2020   Hyperlipidemia    Hypertension    Pacemaker Abbott Assurity MRI model [id number] 07/12/2020 07/12/2020      Dispostion: Disposition decision including need for hospitalization was considered, and patient admitted to the hospital.    Final Clinical Impression(s) / ED Diagnoses Final diagnoses:  Acute metabolic encephalopathy  Single subsegmental pulmonary embolism without acute cor pulmonale (HCC)  Hyponatremia  Acute congestive heart failure, unspecified heart failure type (HCC)     This chart was dictated using voice recognition software.  Despite best efforts to proofread,  errors can occur which can change the documentation meaning.    Francesca Fallow CROME, MD 11/20/23 1215

## 2023-11-20 NOTE — ED Provider Triage Note (Signed)
 Emergency Medicine Provider Triage Evaluation Note  Pamela Dunn , a 89 y.o. female  was evaluated in triage.  Pt here with son who provides the majority of the history.  Per son, patient was taking medication this morning and apparently choked on her food.  Her home health aide gave her the Heimlich maneuver which apparently dislodged what ever she was choking on.  The patient reports to the ED with her son who states that over the last 3 days the patient is had progressively worsening loss of memory.  [place] but reports that he faced times and calls his mom every day and in the last 3 days she has not been able to remember his name.  He reports that she lives up with a home health aide.  He reports that she has had worsening issues with swallowing recently.  He states that she was recently admitted to the hospital for a GI bleed and she continues to have dark and stool.  The patient does have abdominal tenderness on exam.  She denies any complaints to me.  Workup initiated with CBC, CMP, lipase, urinalysis, blood cultures, i-STAT lactic acids, point-of-care occult card, viral panel, chest x-ray, CT head, CT chest abdomen pelvis.  Review of Systems  Positive:  Negative:   Physical Exam  Pulse 67   Temp 97.8 F (36.6 C)   Resp 20   SpO2 97%  Gen:   Awake, no distress   Resp:  Normal effort  MSK:   Moves extremities without difficulty  Other:  Patient disoriented which is not patient baseline per son.  Abdominal tenderness in right upper quadrant, right lower quadrant.  Medical Decision Making  Medically screening exam initiated at 6:38 AM.  Appropriate orders placed.  ADDILYNNE OLHEISER was informed that the remainder of the evaluation will be completed by another provider, this initial triage assessment does not replace that evaluation, and the importance of remaining in the ED until their evaluation is complete.     Ruthell Lonni JULIANNA, [place] 11/20/23 [phone]

## 2023-11-20 NOTE — H&P (Signed)
 History and Physical    Patient: Pamela Dunn FMW:[id number] DOB: 09/15/1931 DOA: 11/20/2023 DOS: the patient was seen and examined on 11/20/2023 PCP: Gerome Brunet, DO  Patient coming from: Home  Chief Complaint: Confusion  HPI: Pamela Dunn is a 89 y.o. female with medical history significant of hypertension, hyperlipidemia, combined systolic and diastolic CHF, complete heart block s/p PPM, GERD who presents due to  worsening confusion and recently getting choked up on her pills. History is obtained mostly from her son who is present at bedside.  She had just recently been recently hospitalized from 8/30-9/1 after coming in with complaints of rectal bleeding for which hemoglobin was noted to be down to 7.5 with low blood pressures for which she was transfused 2 units of packed red blood cells.  Dr. Rollin of GI was consulted and patient underwent EGD which revealed nonbleeding gastric and duodenal ulcers and patient was placed on Protonix  twice daily for 1 month followed by once daily.  Since getting home patient son noted that over the last few days she had becoming more more confused.  Normally she is able to recognize her son but had gotten to the point where she seemed to not even know who he was.  This morning her caregivers reported that she got choked up on her meds and they had to perform the Heimlich.  The patient complains of some stomach pain.  Her left arm has been swollen and blue for which family was concerned for possible blood clot as she had had 1 in this arm previously a year ago.  She was treated with anticoagulation but family was discontinued after resolution of the clot.  For the swelling she had been on Lasix  for this over the last couple of days.  Patient has not had any significant fever or cough to the son's knowledge.  She has not fallen or injured the arm to onset the swelling.  In the ED patient was noted to be mildly tachypneic with blood pressures elevated up to  154/86, and all other vital signs maintained.  Labs noted hemoglobin 11.4, sodium 121, chloride 85, calcium  8.1, ionized calcium  1.05, lipase 339, BNP 441, high-sensitivity troponins 60->51, and lactic acid 1.4.  Chest x-ray noted progressive left pleural effusion since CTA last month now  moderate.  CT scan of the head did not CT angiogram of the chest have been obtained which showed a single small segmental pulmonary embolus in the right lower lobe without significant signs of right heart strain, moderate volume left pleural effusion, and lobulated nodule measuring 1.4 x 1.1 increased in size since December worrisome for enlarging neoplasm.  CT scan of the abdomen pelvis noted stable ductal dilation in the body of the pancreas with mild bilateral renal atrophy.  Patient was given Lasix  40 mg IV and potassium chloride  40 meq p.o.   Review of Systems: As mentioned in the history of present illness. All other systems reviewed and are negative. Past Medical History:  Diagnosis Date   Allergy    seasonal   Anemia    Arthritis    Encounter for care of pacemaker 07/12/2020   GERD (gastroesophageal reflux disease)    Heart block AV complete (HCC) 07/11/2020   Hyperlipidemia    Hypertension    Pacemaker Abbott Assurity MRI model [id number] 07/12/2020 07/12/2020   Past Surgical History:  Procedure Laterality Date   APPENDECTOMY     BACK SURGERY     BOWEL RESECTION N/A 08/24/2019   Procedure:  SMALL BOWEL RESECTION;  Surgeon: Vernetta Berg, MD;  Location: [facility] ORS;  Service: General;  Laterality: N/A;   ESOPHAGOGASTRODUODENOSCOPY N/A 11/08/2023   Procedure: EGD (ESOPHAGOGASTRODUODENOSCOPY);  Surgeon: Rollin Dover, MD;  Location[facility] ENDOSCOPY;  Service: Gastroenterology;  Laterality: N/A;   LAPAROSCOPY N/A 08/24/2019   Procedure: LAPAROSCOPY DIAGNOSTIC;  Surgeon: Vernetta Berg, MD;  Location: [facility] ORS;  Service: General;  Laterality: N/A;   LAPAROTOMY N/A 08/24/2019   Procedure: EXPLORATORY LAPAROTOMY;   Surgeon: Vernetta Berg, MD;  Location: [facility] ORS;  Service: General;  Laterality: N/A;   PACEMAKER IMPLANT N/A 07/12/2020   Procedure: PACEMAKER IMPLANT;  Surgeon: Kelsie Agent, MD;  Location: [facility] INVASIVE CV LAB;  Service: Cardiovascular;  Laterality: N/A;   Social History:  reports that she has never smoked. She has never used smokeless tobacco. She reports that she does not drink alcohol  and does not use drugs.  Allergies  Allergen Reactions   Coreg  [Carvedilol ] Diarrhea   Lorazepam Diarrhea    Family History  Problem Relation Age of Onset   Cancer Mother    Cancer - Colon Sister    Heart disease Sister    Brain cancer Brother    Cancer - Colon Brother    Cancer - Colon Brother    Breast cancer Maternal Aunt     Prior to Admission medications   Medication Sig Start Date End Date Taking? Authorizing Provider  cholecalciferol  (VITAMIN D ) 1000 UNITS tablet Take 1,000 Units by mouth daily.    Yes [provider]  ezetimibe  (ZETIA ) 10 MG tablet Take 10 mg by mouth daily.    Yes [provider]  folic acid  (FOLVITE ) 1 MG tablet Take 1 mg by mouth 2 (two) times daily. 02/09/23  Yes [provider]  furosemide  (LASIX ) 20 MG tablet TAKE 1 TABLET BY MOUTH EVERY DAY AS NEEDED Patient taking differently: Take 20 mg by mouth daily. 07/14/23  Yes Wyn Jackee VEAR Mickey., NP  Glycerin , Laxative, (ADULT SUPPOSITORY RE) Place 1 Dose rectally daily as needed (Constipation).   Yes [provider]  guaiFENesin  (MUCINEX ) 600 MG 12 hr tablet Take 600 mg by mouth 2 (two) times daily as needed for cough or to loosen phlegm.   Yes [provider]  isosorbide -hydrALAZINE  (BIDIL ) 20-37.5 MG tablet Take 1 tablet by mouth 3 (three) times daily. 05/01/23  Yes Ladona Heinz, MD  levothyroxine  (SYNTHROID ) 25 MCG tablet Take 25 mcg by mouth daily.    Yes [provider]  metoprolol  succinate (TOPROL -XL) 25 MG 24 hr tablet Take 1 tablet (25 mg total) by mouth daily.  04/07/23  Yes Ladona Heinz, MD  Multiple Vitamins-Minerals (PRESERVISION AREDS PO) Take 1 Dose by mouth 2 (two) times daily.   Yes [provider]  Omega-3 Fatty Acids (OMEGA-3 FISH OIL PO) Take 1 capsule by mouth in the morning and at bedtime.   Yes [provider]  omeprazole  (PRILOSEC) 40 MG capsule Take 1 capsule (40 mg total) by mouth 2 (two) times daily for 30 days, THEN 1 capsule (40 mg total) daily. 11/09/23 01/08/24 Yes Pahwani, Ravi, MD  polyethylene glycol (MIRALAX  / GLYCOLAX ) 17 g packet Take 17 g by mouth daily as needed for moderate constipation.   Yes [provider]  predniSONE  (DELTASONE ) 5 MG tablet Take 5 mg by mouth daily. 11/03/18  Yes [provider]  sodium bicarbonate  650 MG tablet Take 650 mg by mouth 2 (two) times daily. Patient not taking: Reported on 11/20/2023    [provider]  Physical Exam: Vitals:   11/20/23 1032 11/20/23 1045 11/20/23 1100 11/20/23 1115  BP:      Pulse:  63 66 60  Resp:  13 15 18   Temp: 98 F (36.7 C)     SpO2:  96% 98% 98%    Constitutional: Ill-appearing elderly female currently in no acute distress Eyes: PERRL, lids and conjunctivae normal ENMT: Mucous membranes are moist.  Bruise to the bridge of the nose.  Fair dentition Neck: normal, supple, no masses, no thyromegaly Respiratory: Decreased aeration noted on the left lung field.  No significant wheezes or rhonchi Cardiovascular: Regular rate and rhythm, no murmurs / rubs / gallops.  Swelling noted of the left upper extremity.  +1 pitting bilateral lower extremity edema. Abdomen: no tenderness, no masses palpated.  Bowel sounds positive.  Musculoskeletal: no clubbing.   Normal muscle tone.  Skin: Discoloration noted to the left arm. Neurologic: CN 2-12 grossly intact.  Appears able to move all extremities. Psychiatric: Patient is alert and oriented to self but confuses her son for her husband.  Normal mood.  Data Reviewed:  EKG revealed  a AV dual paced rhythm at 62 bpm.  Reviewed labs, images, and pertinent records as documented.  Assessment and Plan:  Acute metabolic encephalopathy COVID-19 infection Patient presented after having progressively worsening confusion since leaving the hospital.  Found to be positive for COVID-19.  O2 saturations currently maintained on room air.  Chest x-ray showing pleural effusion without signs for pneumonia. - Admit to a telemetry bed - Delirium precautions  Hyponatremia Acute.  Sodium noted to be elevated at 121.  Suspect hypervolemic hyponatremia. - Check urine sodium, urine osmolarity and serum osmolarity - Serial monitoring of BMPs  Heart failure with preserved ejection fraction Left pleural effusion Patient noted to have a large moderate pleural effusion on the left on CT imaging.  Patient had recently been started on Lasix  due to swelling in her left arm.  BNP noted to be elevated at 400 review of records note last echocardiogram noted EF to be around 57% with grade 1 diastolic dysfunction when last checked in 2022. - Strict I&Os and daily weights - Check echocardiogram - Lasix  40 mg IV twice daily.  Reassess and adjust diuresis in a.m.  Pulmonary embolism History of DVT Patient found to have a single small subsegmental pulmonary embolism in the right lower lobe with no findings of right heart strain.  Son requested patient not be placed on anticoagulation unless signs of significant clot burden noted.  Prior history of DVT of the left upper extremity in the past for which patient had been on Eliquis  last year - Checking Doppler ultrasound of the lower extremity(found to have acute right femoral vein DVT) - Follow-up echocardiogram - Heparin  drip per pharmacy  Essential hypertension Blood pressures elevated up to 154/86. - Continue to current medication regimen as tolerated  Normocytic anemia Hemoglobin noted to be 11.4 which appears stable from most recent  hospitalization. - Continue to monitor H&H  Hypothyroidism TSH noted to be elevated 7.726.  Home medication regimen includes levothyroxine  25 mcg daily. -Follow-up free T4 - Continue levothyroxine  - Recommend rechecking thyroid  levels and 4 to 6 weeks  History of GI bleed secondary to duodenal and gastric ulcer Patient just recently discharged on 9/1 after having GI bleeding secondary to duodenal ulcers. - Continue Protonix  40 mg twice daily - Monitor for signs of bleeding.  Rheumatoid arthritis - Continue prednisone   Pulmonary nodule Acute on chronic.  Lobulated nodule measuring  1.4 x 1.1 increased in size since December of 2024 that is concerning for malignancy.  Family note that they would not want this worked up any further.  DVT prophylaxis: Lovenox  Advance Care Planning:   Code Status: Full Code    Consults: None  Family Communication: Son and daughter-in-law updated at bedside  Severity of Illness: The appropriate patient status for this patient is INPATIENT. Inpatient status is judged to be reasonable and necessary in order to provide the required intensity of service to ensure the patient's safety. The patient's presenting symptoms, physical exam findings, and initial radiographic and laboratory data in the context of their chronic comorbidities is felt to place them at high risk for further clinical deterioration. Furthermore, it is not anticipated that the patient will be medically stable for discharge from the hospital within 2 midnights of admission.   * I certify that at the point of admission it is my clinical judgment that the patient will require inpatient hospital care spanning beyond 2 midnights from the point of admission due to high intensity of service, high risk for further deterioration and high frequency of surveillance required.*  Author: Maximino DELENA Sharps, MD 11/20/2023 11:42 AM  For on call review [email or web address].

## 2023-11-21 ENCOUNTER — Inpatient Hospital Stay (HOSPITAL_COMMUNITY)

## 2023-11-21 ENCOUNTER — Other Ambulatory Visit (HOSPITAL_COMMUNITY): Payer: Self-pay

## 2023-11-21 DIAGNOSIS — I5023 Acute on chronic systolic (congestive) heart failure: Secondary | ICD-10-CM | POA: Diagnosis not present

## 2023-11-21 DIAGNOSIS — U071 COVID-19: Secondary | ICD-10-CM | POA: Diagnosis not present

## 2023-11-21 DIAGNOSIS — G9341 Metabolic encephalopathy: Secondary | ICD-10-CM

## 2023-11-21 DIAGNOSIS — I509 Heart failure, unspecified: Secondary | ICD-10-CM

## 2023-11-21 DIAGNOSIS — Z950 Presence of cardiac pacemaker: Secondary | ICD-10-CM

## 2023-11-21 DIAGNOSIS — I2693 Single subsegmental pulmonary embolism without acute cor pulmonale: Secondary | ICD-10-CM | POA: Diagnosis not present

## 2023-11-21 DIAGNOSIS — E871 Hypo-osmolality and hyponatremia: Secondary | ICD-10-CM | POA: Diagnosis not present

## 2023-11-21 DIAGNOSIS — G9349 Other encephalopathy: Secondary | ICD-10-CM | POA: Diagnosis not present

## 2023-11-21 LAB — BLOOD CULTURE ID PANEL (REFLEXED) - BCID2

## 2023-11-21 LAB — CBC
HCT: 30.1 % — ABNORMAL LOW (ref 36.0–46.0)
Hemoglobin: 10.5 g/dL — ABNORMAL LOW (ref 12.0–15.0)
MCH: 30.8 pg (ref 26.0–34.0)
MCHC: 34.9 g/dL (ref 30.0–36.0)
MCV: 88.3 fL (ref 80.0–100.0)
Platelets: 378 K/uL (ref 150–400)
RBC: 3.41 MIL/uL — ABNORMAL LOW (ref 3.87–5.11)
RDW: 15.3 % (ref 11.5–15.5)
WBC: 7.8 K/uL (ref 4.0–10.5)
nRBC: 0.3 % — ABNORMAL HIGH (ref 0.0–0.2)

## 2023-11-21 LAB — BASIC METABOLIC PANEL WITH GFR
Anion gap: 13 (ref 5–15)
Anion gap: 13 (ref 5–15)
Anion gap: 14 (ref 5–15)
Anion gap: 15 (ref 5–15)
BUN: 18 mg/dL (ref 8–23)
BUN: 19 mg/dL (ref 8–23)
BUN: 19 mg/dL (ref 8–23)
BUN: 20 mg/dL (ref 8–23)
CO2: 22 mmol/L (ref 22–32)
CO2: 23 mmol/L (ref 22–32)
CO2: 26 mmol/L (ref 22–32)
CO2: 27 mmol/L (ref 22–32)
Calcium: 7.6 mg/dL — ABNORMAL LOW (ref 8.9–10.3)
Calcium: 7.7 mg/dL — ABNORMAL LOW (ref 8.9–10.3)
Calcium: 7.8 mg/dL — ABNORMAL LOW (ref 8.9–10.3)
Calcium: 8 mg/dL — ABNORMAL LOW (ref 8.9–10.3)
Chloride: 83 mmol/L — ABNORMAL LOW (ref 98–111)
Chloride: 83 mmol/L — ABNORMAL LOW (ref 98–111)
Chloride: 83 mmol/L — ABNORMAL LOW (ref 98–111)
Chloride: 85 mmol/L — ABNORMAL LOW (ref 98–111)
Creatinine, Ser: 0.84 mg/dL (ref 0.44–1.00)
Creatinine, Ser: 0.88 mg/dL (ref 0.44–1.00)
Creatinine, Ser: 0.91 mg/dL (ref 0.44–1.00)
Creatinine, Ser: 0.91 mg/dL (ref 0.44–1.00)
GFR, Estimated: 60 mL/min — ABNORMAL LOW (ref >60)
GFR, Estimated: 60 mL/min — ABNORMAL LOW (ref >60)
GFR, Estimated: >60 mL/min (ref >60)
GFR, Estimated: >60 mL/min (ref >60)
Glucose, Bld: 103 mg/dL — ABNORMAL HIGH (ref 70–99)
Glucose, Bld: 128 mg/dL — ABNORMAL HIGH (ref 70–99)
Glucose, Bld: 136 mg/dL — ABNORMAL HIGH (ref 70–99)
Glucose, Bld: 84 mg/dL (ref 70–99)
Potassium: 3.8 mmol/L (ref 3.5–5.1)
Potassium: 3.8 mmol/L (ref 3.5–5.1)
Potassium: 4.1 mmol/L (ref 3.5–5.1)
Potassium: 4.7 mmol/L (ref 3.5–5.1)
Sodium: 120 mmol/L — ABNORMAL LOW (ref 135–145)
Sodium: 122 mmol/L — ABNORMAL LOW (ref 135–145)
Sodium: 122 mmol/L — ABNORMAL LOW (ref 135–145)
Sodium: 123 mmol/L — ABNORMAL LOW (ref 135–145)

## 2023-11-21 LAB — HEPATIC FUNCTION PANEL
ALT: 12 U/L (ref 0–44)
AST: 30 U/L (ref 15–41)
Albumin: 1.9 g/dL — ABNORMAL LOW (ref 3.5–5.0)
Alkaline Phosphatase: 57 U/L (ref 38–126)
Bilirubin, Direct: 0.3 mg/dL — ABNORMAL HIGH (ref 0.0–0.2)
Indirect Bilirubin: 0.5 mg/dL (ref 0.3–0.9)
Total Bilirubin: 0.8 mg/dL (ref 0.0–1.2)
Total Protein: 4.1 g/dL — ABNORMAL LOW (ref 6.5–8.1)

## 2023-11-21 LAB — HEPARIN LEVEL (UNFRACTIONATED)
Heparin Unfractionated: 0.64 [IU]/mL (ref 0.30–0.70)
Heparin Unfractionated: 0.81 [IU]/mL — ABNORMAL HIGH (ref 0.30–0.70)
Heparin Unfractionated: 0.83 [IU]/mL — ABNORMAL HIGH (ref 0.30–0.70)

## 2023-11-21 LAB — SODIUM, URINE, RANDOM: Sodium, Ur: 76 mmol/L

## 2023-11-21 LAB — BRAIN NATRIURETIC PEPTIDE: B Natriuretic Peptide: 212 pg/mL — ABNORMAL HIGH (ref 0.0–100.0)

## 2023-11-21 LAB — OSMOLALITY, URINE: Osmolality, Ur: 320 mosm/kg (ref 300–900)

## 2023-11-21 MED ORDER — SODIUM CHLORIDE 0.9% FLUSH: 10.0000 mL | INTRAVENOUS | Status: DC | PRN

## 2023-11-21 MED ORDER — SODIUM CHLORIDE 0.9% FLUSH
10.0000 mL | Freq: Two times a day (BID) | INTRAVENOUS | Status: DC
  Administered 2023-11-21: 20 mL
  Administered 2023-11-21 – 2023-11-26 (×10): 10 mL

## 2023-11-21 NOTE — Progress Notes (Signed)

## 2023-11-21 NOTE — Progress Notes (Addendum)
 BP is soft. We will hold Bidil  (Isosorbide -Hydralazine  20-37.5 mg) tonight. Continue to monitor.   11/20/23 1956  Vitals  Temp 97.7 F (36.5 C)  Temp Source Oral  BP 89/60 -  94/60 mmHg  MAP (mmHg) 69-71 mmHg  BP Location Left Leg  BP Method Automatic  Patient Position (if appropriate) Lying  Pulse Rate 60  Pulse Rate Source Monitor  ECG Heart Rate 64  Resp 15  Level of Consciousness  Level of Consciousness Alert  MEWS COLOR  MEWS Score Color Green  Oxygen Therapy  SpO2 99 %  O2 Device Room Air  Pain Assessment  Pain Scale 0-10  Pain Score 1  Pain Type Acute pain  Pain Location Leg  Pain Orientation Left  Pain Intervention(s) Repositioned;Elevated extremity;Emotional support;Rest;Relaxation  Glasgow Coma Scale  Eye Opening 4  Best Verbal Response (NON-intubated) 4  Best Motor Response 6  Glasgow Coma Scale Score 14    Wendi Dash, RN

## 2023-11-21 NOTE — Plan of Care (Signed)
  Problem: Nutrition: Goal: Adequate nutrition will be maintained Outcome: Progressing   Problem: Elimination: Goal: Will not experience complications related to bowel motility Outcome: Progressing Goal: Will not experience complications related to urinary retention Outcome: Progressing   Problem: Pain Managment: Goal: General experience of comfort will improve and/or be controlled Outcome: Progressing   Problem: Safety: Goal: Ability to remain free from injury will improve Outcome: Progressing   Problem: Education: Goal: Knowledge of General Education information will improve Description: Including pain rating scale, medication(s)/side effects and non-pharmacologic comfort measures Outcome: Not Progressing   Problem: Health Behavior/Discharge Planning: Goal: Ability to manage health-related needs will improve Outcome: Not Progressing   Problem: Clinical Measurements: Goal: Ability to maintain clinical measurements within normal limits will improve Outcome: Not Progressing Goal: Cardiovascular complication will be avoided Outcome: Not Progressing   Problem: Activity: Goal: Risk for activity intolerance will decrease Outcome: Not Progressing   Problem: Skin Integrity: Goal: Risk for impaired skin integrity will decrease Outcome: Not Progressing

## 2023-11-21 NOTE — Progress Notes (Signed)
 PHARMACY - ANTICOAGULATION CONSULT NOTE  Pharmacy Consult for Heparin  Indication: pulmonary embolus and DVT  Allergies  Allergen Reactions   Coreg  [Carvedilol ] Diarrhea   Lorazepam Diarrhea    Patient Measurements: Height: 5' (152.4 cm) Weight: 65.6 kg (144 lb 10 oz) IBW/kg (Calculated) : 45.5 HEPARIN  DW (KG): 59.5  Vital Signs: Temp: 97.7 F (36.5 C) (09/13 0810) Temp Source: Oral (09/13 0810) BP: 136/74 (09/13 0810) Pulse Rate: 64 (09/13 0810)  Labs: Recent Labs    11/20/23 0815 11/20/23 1004 11/20/23 1005 11/20/23 1016 11/20/23 2355 11/21/23 0511 11/21/23 0555  HGB 11.4*  --   --  11.2*  --  10.5*  --   HCT 33.5*  --   --  33.0*  --  30.1*  --   PLT 381  --   --   --   --  378  --   HEPARINUNFRC  --   --   --   --  0.83*  --  0.81*  CREATININE  --    < >  --  1.20* 0.84 0.88  --   TROPONINIHS 60*  --  51*  --   --   --   --    < > = values in this interval not displayed.    Estimated Creatinine Clearance: 35.9 mL/min (by C-G formula based on SCr of 0.88 mg/dL).   Medical History: Past Medical History:  Diagnosis Date   Allergy    seasonal   Anemia    Arthritis    Encounter for care of pacemaker 07/12/2020   GERD (gastroesophageal reflux disease)    Heart block AV complete (HCC) 07/11/2020   Hyperlipidemia    Hypertension    Pacemaker Abbott Assurity MRI model [id number] 07/12/2020 07/12/2020    Medications:  Medications Prior to Admission  Medication Sig Dispense Refill Last Dose/Taking   cholecalciferol  (VITAMIN D ) 1000 UNITS tablet Take 1,000 Units by mouth daily.    11/19/2023   ezetimibe  (ZETIA ) 10 MG tablet Take 10 mg by mouth daily.    11/19/2023   folic acid  (FOLVITE ) 1 MG tablet Take 1 mg by mouth 2 (two) times daily.   11/19/2023   furosemide  (LASIX ) 20 MG tablet TAKE 1 TABLET BY MOUTH EVERY DAY AS NEEDED (Patient taking differently: Take 20 mg by mouth daily.) 90 tablet 3 11/19/2023   Glycerin , Laxative, (ADULT SUPPOSITORY RE) Place 1 Dose  rectally daily as needed (Constipation).   Past Week   guaiFENesin  (MUCINEX ) 600 MG 12 hr tablet Take 600 mg by mouth 2 (two) times daily as needed for cough or to loosen phlegm.   Unknown   isosorbide -hydrALAZINE  (BIDIL ) 20-37.5 MG tablet Take 1 tablet by mouth 3 (three) times daily. 270 tablet 3 11/19/2023   levothyroxine  (SYNTHROID ) 25 MCG tablet Take 25 mcg by mouth daily.    11/19/2023   metoprolol  succinate (TOPROL -XL) 25 MG 24 hr tablet Take 1 tablet (25 mg total) by mouth daily. 90 tablet 3 11/19/2023   Multiple Vitamins-Minerals (PRESERVISION AREDS PO) Take 1 Dose by mouth 2 (two) times daily.   Past Week   Omega-3 Fatty Acids (OMEGA-3 FISH OIL PO) Take 1 capsule by mouth in the morning and at bedtime.   11/19/2023   omeprazole  (PRILOSEC) 40 MG capsule Take 1 capsule (40 mg total) by mouth 2 (two) times daily for 30 days, THEN 1 capsule (40 mg total) daily. 90 capsule 0 11/19/2023   polyethylene glycol (MIRALAX  / GLYCOLAX ) 17 g packet Take 17 g by  mouth daily as needed for moderate constipation.   Unknown   predniSONE  (DELTASONE ) 5 MG tablet Take 5 mg by mouth daily.   11/19/2023   Scheduled:   ezetimibe   10 mg Oral Daily   folic acid   1 mg Oral BID   furosemide   40 mg Intravenous BID   isosorbide -hydrALAZINE   1 tablet Oral TID   levothyroxine   25 mcg Oral Q0600   metoprolol  succinate  25 mg Oral Daily   pantoprazole   40 mg Oral BID   potassium chloride   40 mEq Oral BID   predniSONE   5 mg Oral Daily   sodium chloride  flush  10-40 mL Intracatheter Q12H   sodium chloride  flush  3 mL Intravenous Q12H   Infusions:   heparin  700 Units/hr (11/21/23 0317)   PRN: acetaminophen  **OR** acetaminophen , albuterol , guaiFENesin , sodium chloride  flush  Assessment: 89 YO F with PMH significant for recent GIB- EGD showing nonbleeding gastric and duodenal ulcer, HTN, HLD, combined systolic and diastolic CHF, GERD presented with AMS and choking on AM medications. Pharmacy consulted for Heparin  for  PE/DVT/  09/13 AM update: Heparin  still supratherapeutic this AM at 0.81. No s/sx of bleeding noted per RN. No issues with line. CBC stable.  Goal of Therapy:  Heparin  level 0.3-0.7 units/ml Monitor platelets by anticoagulation protocol: Yes   Plan:  Decrease Heparin  infusion to 550 units/hr Check anti-Xa level in 8 hours and daily while on heparin  Continue to monitor H&H and platelets  R. Samual Satterfield, PharmD PGY-1 Acute Care Pharmacy Resident [facility] Please refer to [facility] for [facility] Pharmacy numbers 11/21/2023 9:15 AM

## 2023-11-21 NOTE — Procedures (Signed)
 Patient presents for diagnostic thoracentesis. I was present during limited US  of the chest to evaluate for pleural effusion. US  limited shows minimal pleural fluid with consolidation/atelectatic lung. There is no adequate pocket of fluid to safely proceed with thoracentesis. Due to circumstances unable to perform a safe thoracentesis.   After discussion of the risks versus the benefits of the procedure the patient elected to defer the thoracentesis at this time.  Procedure not performed.   Pamela Dunn M Kelee Cunningham PA-C 11/21/2023 2:30 PM

## 2023-11-21 NOTE — Progress Notes (Signed)
 OT Cancellation Note  Patient Details Name: Pamela Dunn MRN: [id number] DOB: Jun 17, 1931   Cancelled Treatment:    Reason Eval/Treat Not Completed: Medical issues which prohibited therapy Patient started on heparin  at 1300 on 9/12 for acute DVT. Per acute rehab therapy guidelines, OT is to hold until 24 hours of heparin  administration. OT will complete evaluation when medically appropriate.   [name] Gift E. Petro Talent, OTR/L Acute Rehabilitation Services [phone]   [name] 11/21/2023, 9:58 AM

## 2023-11-21 NOTE — Progress Notes (Signed)
 Pt is alert, oriented x 1 to self, cooperative, able to follow commands, on room air, normal respiratory effort, no SOB, stable hemodynamically, AV-paced on the monitor, no acute distress. Pt is able to rest well overnight with no major complaints. Pt has compromised skin integrity due to bowel and urinary incontinent, bilateral arms swelling and deformities from RA, with presenting of stage 1 pressure injury prior this admission. Foam dressing is applied. Skin care is provided. Pt is total care and requiring maximum assistance. Continue with airborne isolation due to COVID positive.  Plan of care is reviewed. We will continue to monitor.  Problem: Clinical Measurements: Goal: Ability to maintain clinical measurements within normal limits will improve Outcome: Progressing Goal: Will remain free from infection Outcome: Progressing Goal: Diagnostic test results will improve Outcome: Progressing Goal: Respiratory complications will improve Outcome: Progressing Goal: Cardiovascular complication will be avoided Outcome: Progressing   Problem: Elimination: Goal: Will not experience complications related to bowel motility Outcome: Progressing Goal: Will not experience complications related to urinary retention Outcome: Progressing   Problem: Skin Integrity: Goal: Risk for impaired skin integrity will decrease Outcome: Progressing   Wendi Dash, RN

## 2023-11-21 NOTE — Progress Notes (Signed)
 [facility]                                                                              Pamela Dunn, is a 89 y.o. female, DOB - 12/19/1931, FMW:[id number] Admit date - 11/20/2023    Outpatient Primary MD for the patient is Gerome Brunet, DO  LOS - 1  days  Chief Complaint  Patient presents with   Choking       Brief summary   Patient is a 89 year old female with HTN, HLP, combined systolic and diastolic CHF, complete heart block s/p pacemaker, GERD, recently discharged on 9/1 (admitted for GI bleed, EGD with nonbleeding gastric and duodenal ulcers), presented with confusion.  Per family, over the last few days, she was becoming more confused, not recognizing family.  On the morning of admission had caregivers noted that she got choked up on her medications, complained of abdominal pain, her left arm has been swollen and blue, previously has been treated with anticoagulation for DVT.  She also has been on Lasix  over the last couple of days.  No fevers or cough.  Chest x-ray showed progressive left pleural effusion. CTA chest showed small segmental pulmonary embolus in the right lower lobe, without significant signs of right heart strain, moderate volume left pleural effusion, lobulated nodule 1.4x 1.1 cm increased in size since December worrisome for neoplasm.  CT abdomen noted stable ductal dilation in the body of the pancreas with mild bilateral renal atrophy Sodium 121, BNP 441, troponin 60-51 Received Lasix  40 mg IV x 1 in ED and admitted for further workup  Assessment & Plan    Acute metabolic encephalopathy COVID-19 infection - Worsening confusion since leaving the hospital from the previous admission on 9/1  - Positive for COVID, no acute hypoxia chest x-ray showed pleural effusion, no infiltrates and with acute hyponatremia.   - Continue delirium precautions, airborne precautions.   - per son, mental status is getting better, recognizes him today   Acute  hyponatremia - Acute, sodium 121, suspect hypervolemic hyponatremia with anasarca, pleural effusions - On IV Lasix , sodium slightly improving 123 - Urine osmolality, UNa still pending, recheck BMET   Acute on chronic combined systolic and diastolic CHF heart failure Left pleural effusion - 2D echo showed EF of 45 to 50%, diastolic parameters indeterminate, moderate asymmetric LVH of the basal septal segment, normal RV SF, moderate pleural effusion in the left atrial region - Continue IV Lasix , strict I's and O's and daily weights - Cardiology consulted -IR eval for left-sided thoracentesis, discussed with the son    Acute small subsegmental pulmonary embolism Acute RLE DVT with history of DVT -CTA chest-single small subsegmental pulmonary embolism in the right lower lobe with no findings of right heart strain.  - Venous Dopplers upper extremity showed no DVT or SVT  - venous doppler US  LE showed acute DVT in the right common femoral vein and right femoral vein, no DVT in the left lower extremity - Placed on IV heparin  per pharmacy, discussed with the patient's son, okay with anticoagulation given acute DVT and PE   Essential hypertension -BP currently stable, continue BiDil , Lasix , Toprol -XL  Normocytic anemia Hemoglobin noted to be 11.4 which appears stable from most recent hospitalization. - Continue to monitor H&H   Hypothyroidism TSH 7.726, free T4 1.46 - Continue levothyroxine  25 mcg, outpatient thyroid  levels in 4 to 6 weeks   History of GI bleed secondary to duodenal and gastric ulcer - discharged on 9/1 after having GI bleeding secondary to duodenal ulcers. - Continue Protonix  40 mg twice daily - Monitor for signs of bleeding.   Rheumatoid arthritis - Continue prednisone    Pulmonary nodule Acute on chronic.  Lobulated nodule measuring 1.4 x 1.1 increased in size since December of 2024 that is concerning for malignancy.  Family note that they would not want this worked  up any further.  Generalized debility, anasarca, hypoalbuminemia - With advanced age, comorbidities, frail, palliative medicine consulted for GOC (discussed with patient's son) - SLP evaluation  Estimated body mass index is 28.24 kg/m as calculated from the following:   Height as of this encounter: 5' (1.524 m).   Weight as of this encounter: 65.6 kg.  Code Status: Dunn code DVT Prophylaxis:  Heparin  drip   Level of Care: Level of care: Telemetry Medical Family Communication: Updated patient's son, Mr Mavi Un on the phone Disposition Plan:      Remains inpatient appropriate:      Procedures:  2D echo  Consultants:   Cardiology  Antimicrobials:   Anti-infectives (From admission, onward)    None          Medications  ezetimibe   10 mg Oral Daily   folic acid   1 mg Oral BID   furosemide   40 mg Intravenous BID   isosorbide -hydrALAZINE   1 tablet Oral TID   levothyroxine   25 mcg Oral Q0600   metoprolol  succinate  25 mg Oral Daily   pantoprazole   40 mg Oral BID   potassium chloride   40 mEq Oral BID   predniSONE   5 mg Oral Daily   sodium chloride  flush  10-40 mL Intracatheter Q12H   sodium chloride  flush  3 mL Intravenous Q12H      Subjective:   Pamela Dunn was seen and examined today.  Much more alert and oriented, unclear of baseline.  Appears to have pain in the right lower extremity, shortness of breath, anasarca, arms are swollen.  No acute chest pain, fever chills, productive cough.     Objective:   Vitals:   11/20/23 2200 11/21/23 0052 11/21/23 0410 11/21/23 0810  BP: 115/86 (!) 116/36 (!) 121/43 136/74  Pulse: 63 64 66 64  Resp: 15 18 16 20   Temp:  97.7 F (36.5 C) 97.8 F (36.6 C) 97.7 F (36.5 C)  TempSrc:  Oral Oral Oral  SpO2: 98% 98% 97% 94%  Weight:      Height:        Intake/Output Summary (Last 24 hours) at 11/21/2023 1035 Last data filed at 11/21/2023 0419 Gross per 24 hour  Intake 377.91 ml  Output 1950 ml  Net -1572.09 ml      Wt Readings from Last 3 Encounters:  11/20/23 65.6 kg  11/07/23 61.2 kg  04/08/23 59.4 kg     Exam General: Alert and oriented x 3, NAD Cardiovascular: S1 S2 auscultated,  RRR Respiratory: Decreased breath sound at the bases, L>R Gastrointestinal: Anasarca, NT, ND, NBS, n Ext: 1+ pitting edema bilaterally, arms are also swollen  Neuro: No new deficits Skin: RLE with dressing intact lower leg, patient refused exam, arms are swollen  Psych: Responding to questions, commands.  Data Reviewed:  I have personally reviewed following labs    CBC Lab Results  Component Value Date   WBC 7.8 11/21/2023   RBC 3.41 (L) 11/21/2023   HGB 10.5 (L) 11/21/2023   HCT 30.1 (L) 11/21/2023   MCV 88.3 11/21/2023   MCH 30.8 11/21/2023   PLT 378 11/21/2023   MCHC 34.9 11/21/2023   RDW 15.3 11/21/2023   LYMPHSABS 1.0 11/09/2023   MONOABS 0.7 11/09/2023   EOSABS 0.2 11/09/2023   BASOSABS 0.1 11/09/2023     Last metabolic panel Lab Results  Component Value Date   NA 123 (L) 11/21/2023   K 3.8 11/21/2023   CL 83 (L) 11/21/2023   CO2 27 11/21/2023   BUN 19 11/21/2023   CREATININE 0.88 11/21/2023   GLUCOSE 84 11/21/2023   GFRNONAA >60 11/21/2023   GFRAA 46 (L) 09/02/2019   CALCIUM  7.8 (L) 11/21/2023   PHOS 3.8 03/04/2022   PROT 4.7 (L) 11/20/2023   ALBUMIN  2.1 (L) 11/20/2023   BILITOT 0.6 11/20/2023   ALKPHOS 58 11/20/2023   AST 23 11/20/2023   ALT 13 11/20/2023   ANIONGAP 13 11/21/2023    CBG (last 3)  No results for input(s): GLUCAP in the last 72 hours.    Coagulation Profile: No results for input(s): INR, PROTIME in the last 168 hours.   Radiology Studies: I have personally reviewed the imaging studies  VAS US  LOWER EXTREMITY VENOUS (DVT) Result Date: 11/21/2023  Lower Venous DVT Study Patient Name:  Pamela Dunn  Date of Exam:   11/20/2023 Medical Rec #: [id number]      Accession #:    [id number] Date of Birth: 1931/07/11      Patient Gender: F Patient  Age:   80 years Exam Location:  [facility] Procedure:      VAS US  LOWER EXTREMITY VENOUS (DVT) Referring Phys: MAXIMINO SHARPS --------------------------------------------------------------------------------  Indications: Pulmonary embolism.  Risk Factors: Confirmed PE. Limitations: Poor ultrasound/tissue interface, body habitus and patient pain tolerance, patient movement. Comparison Study: No prior studies. Performing Technologist: Cordella Collet RVT  Examination Guidelines: A complete evaluation includes B-mode imaging, spectral Doppler, color Doppler, and power Doppler as needed of all accessible portions of each vessel. Bilateral testing is considered an integral part of a complete examination. Limited examinations for reoccurring indications may be performed as noted. The reflux portion of the exam is performed with the patient in reverse Trendelenburg.  +---------+---------------+---------+-----------+----------+-------------------+ RIGHT    CompressibilityPhasicitySpontaneityPropertiesThrombus Aging      +---------+---------------+---------+-----------+----------+-------------------+ CFV      Partial        Yes      Yes                  Acute               +---------+---------------+---------+-----------+----------+-------------------+ SFJ      Dunn                                                             +---------+---------------+---------+-----------+----------+-------------------+ FV Prox  Partial        Yes      Yes                  Acute               +---------+---------------+---------+-----------+----------+-------------------+  FV Mid   Partial        No       No                   Acute               +---------+---------------+---------+-----------+----------+-------------------+ FV DistalPartial        No       No                   Acute               +---------+---------------+---------+-----------+----------+-------------------+ POP      Dunn            No       No                                       +---------+---------------+---------+-----------+----------+-------------------+ PTV                                                   Not well visualized +---------+---------------+---------+-----------+----------+-------------------+ PERO                                                  Not well visualized +---------+---------------+---------+-----------+----------+-------------------+   +---------+---------------+---------+-----------+----------+-------------------+ LEFT     CompressibilityPhasicitySpontaneityPropertiesThrombus Aging      +---------+---------------+---------+-----------+----------+-------------------+ CFV      Dunn           Yes      Yes                                      +---------+---------------+---------+-----------+----------+-------------------+ SFJ      Dunn                                                             +---------+---------------+---------+-----------+----------+-------------------+ FV Prox  Dunn                                                             +---------+---------------+---------+-----------+----------+-------------------+ FV Mid   Dunn                                                             +---------+---------------+---------+-----------+----------+-------------------+ FV Distal               Yes      Yes                                      +---------+---------------+---------+-----------+----------+-------------------+  PFV      Dunn                                                             +---------+---------------+---------+-----------+----------+-------------------+ POP      Dunn           Yes      Yes                                      +---------+---------------+---------+-----------+----------+-------------------+ PTV      Dunn                                                              +---------+---------------+---------+-----------+----------+-------------------+ PERO                                                  Not well visualized +---------+---------------+---------+-----------+----------+-------------------+     Summary: RIGHT: - Findings consistent with acute deep vein thrombosis involving the right common femoral vein, and right femoral vein.  - No cystic structure found in the popliteal fossa.  LEFT: - There is no evidence of deep vein thrombosis in the lower extremity. However, portions of this examination were limited- see technologist comments above.  - No cystic structure found in the popliteal fossa.  *See table(s) above for measurements and observations. Electronically signed by Fonda Rim on 11/21/2023 at 8:28:47 AM.    Final    ECHOCARDIOGRAM COMPLETE Result Date: 11/20/2023    ECHOCARDIOGRAM REPORT   Patient Name:   Pamela Dunn Date of Exam: 11/20/2023 Medical Rec #:  [id number]     Height:       60.0 in Accession #:    [id number]    Weight:       144.6 lb Date of Birth:  October 22, 1931     BSA:          1.626 m Patient Age:    89 years      BP:           171/80 mmHg Patient Gender: F             HR:           63 bpm. Exam Location:  Inpatient Procedure: 2D Echo, Cardiac Doppler and Color Doppler (Both Spectral and Color            Flow Doppler were utilized during procedure). Indications:    Pulmonary Embolus I26.09  History:        Patient has prior history of Echocardiogram examinations, most                 recent 06/11/2021. CHF; Risk Factors:Dyslipidemia.  Sonographer:    Thea Norlander RCS Referring Phys: [id number] RONDELL A SMITH IMPRESSIONS  1. Left ventricular ejection fraction, by estimation, is 45 to 50%. The left ventricle has mildly decreased function. The left ventricle demonstrates regional wall motion  abnormalities. There is moderate asymmetric left ventricular hypertrophy of the basal-septal segment. Left ventricular diastolic parameters are  indeterminate. Abnormal (paradoxical) septal motion, consistent with RV pacemaker  2. Right ventricular systolic function is normal. The right ventricular size is normal.  3. Moderate pleural effusion in the left lateral region.  4. The mitral valve is degenerative. Mild mitral valve regurgitation. No evidence of mitral stenosis.  5. Tricuspid valve regurgitation is moderate.  6. The aortic valve is tricuspid. Aortic valve regurgitation is not visualized. No aortic stenosis is present.  7. The inferior vena cava is normal in size with greater than 50% respiratory variability, suggesting right atrial pressure of 3 mmHg. FINDINGS  Left Ventricle: Left ventricular ejection fraction, by estimation, is 45 to 50%. The left ventricle has mildly decreased function. The left ventricle demonstrates regional wall motion abnormalities. The left ventricular internal cavity size was small. There is moderate asymmetric left ventricular hypertrophy of the basal-septal segment. Abnormal (paradoxical) septal motion, consistent with RV pacemaker. Left ventricular diastolic parameters are indeterminate. Right Ventricle: The right ventricular size is normal. No increase in right ventricular wall thickness. Right ventricular systolic function is normal. Left Atrium: Left atrial size was normal in size. Right Atrium: Right atrial size was normal in size. Pericardium: Trivial pericardial effusion is present. Mitral Valve: The mitral valve is degenerative in appearance. Mild mitral valve regurgitation. No evidence of mitral valve stenosis. MV peak gradient, 4.3 mmHg. The mean mitral valve gradient is 2.0 mmHg. Tricuspid Valve: The tricuspid valve is normal in structure. Tricuspid valve regurgitation is moderate. Aortic Valve: The aortic valve is tricuspid. Aortic valve regurgitation is not visualized. No aortic stenosis is present. Aortic valve peak gradient measures 4.9 mmHg. Pulmonic Valve: The pulmonic valve was not well visualized.  Pulmonic valve regurgitation is trivial. Aorta: The aortic root and ascending aorta are structurally normal, with no evidence of dilitation. Venous: The inferior vena cava is normal in size with greater than 50% respiratory variability, suggesting right atrial pressure of 3 mmHg. IAS/Shunts: The interatrial septum was not well visualized. Additional Comments: There is a moderate pleural effusion in the left lateral region.  LEFT VENTRICLE PLAX 2D LVIDd:         3.50 cm   Diastology LVIDs:         2.60 cm   LV e' medial:  4.90 cm/s LV PW:         0.80 cm   LV e' lateral: 4.35 cm/s LV IVS:        1.00 cm LVOT diam:     1.90 cm LV SV:         51 LV SV Index:   31 LVOT Area:     2.84 cm  RIGHT VENTRICLE             IVC RV S prime:     10.10 cm/s  IVC diam: 1.50 cm TAPSE (M-mode): 2.0 cm LEFT ATRIUM           Index        RIGHT ATRIUM           Index LA diam:      2.50 cm 1.54 cm/m   RA Area:     11.90 cm LA Vol (A4C): 23.5 ml 14.45 ml/m  RA Volume:   24.90 ml  15.31 ml/m  AORTIC VALVE AV Area (Vmax): 2.28 cm AV Vmax:        111.00 cm/s AV Peak Grad:   4.9 mmHg LVOT Vmax:  89.10 cm/s LVOT Vmean:     54.500 cm/s LVOT VTI:       0.179 m  AORTA Ao Root diam: 2.90 cm Ao Asc diam:  3.40 cm MITRAL VALVE MV Area VTI:  1.78 cm   SHUNTS MV Peak grad: 4.3 mmHg   Systemic VTI:  0.18 m MV Mean grad: 2.0 mmHg   Systemic Diam: 1.90 cm MV Vmax:      1.04 m/s MV Vmean:     70.8 cm/s Lonni Nanas MD Electronically signed by Lonni Nanas MD Signature Date/Time: 11/20/2023/9:07:39 PM    Final    CT Angio Chest Pulmonary Embolism (PE) W or WO Contrast Result Date: 11/20/2023 CLINICAL DATA:  Pulmonary embolism (PE) suspected, high prob EXAM: CT ANGIOGRAPHY CHEST WITH CONTRAST TECHNIQUE: Multidetector CT imaging of the chest was performed using the standard protocol during bolus administration of intravenous contrast. Multiplanar CT image reconstructions and MIPs were obtained to evaluate the vascular anatomy.  RADIATION DOSE REDUCTION: This exam was performed according to the departmental dose-optimization program which includes automated exposure control, adjustment of the mA and/or kV according to patient size and/or use of iterative reconstruction technique. CONTRAST:  75mL OMNIPAQUE  IOHEXOL  350 MG/ML SOLN COMPARISON:  09/14/2023, 03/06/2023 FINDINGS: Pulmonary Embolism: Single segmental pulmonary embolus within the right lower lobe. The RV-LV ratio is less than 1. No findings of right heart strain. Cardiovascular: No cardiomegaly or pericardial effusion.Left chest pacemaker/AICD with leads terminating in the right atrium and right ventricle.No aortic aneurysm. Diffuse aortic and dense multi-vessel coronary atherosclerosis. Mediastinum/Nodes: No mediastinal mass.Small hiatal hernia.No mediastinal, hilar, or axillary lymphadenopathy. Lungs/Pleura: The midline trachea and bronchi are patent. Biapical pleuroparenchymal scarring. Moderate volume left pleural effusion, which may be partially loculated in the more superior aspects, with near complete compressive atelectasis of the left lower lobe. Small right pleural effusion. No pneumothorax. Unchanged posterior right lung apex nodules, measuring 4 mm and 6 mm respectively (axial 45 and 50). Lobular nodule in the posterior right upper lobe, measuring 1.4 x 1.1 cm (axial 62), increased in size in the interim, measuring 6 mm previously. Unchanged 4.5 mm anterobasal right lower lobe nodule (axial 106). Musculoskeletal: Diffuse osteopenia. No acute fracture or destructive bone lesion. Moderate osteoarthritis of the right glenohumeral joint. Left shoulder arthroplasty is anatomically aligned without dislocation. Multilevel degenerative disc disease of the spine. Upper Abdomen: No acute abnormality in the partially visualized upper abdomen. Review of the MIP images confirms the above findings. IMPRESSION: 1. Single small segmental pulmonary embolus in the right lower lobe. No  findings of right heart strain. 2. Moderate volume left pleural effusion, which may be partially loculated in the more superior aspect. Dense consolidation filling the majority of the left lower lobe, favored to represent near complete compressive atelectasis. 3. Lobular nodule in the posterior right upper lobe (axial 62), measuring 1.4 x 1.1 cm. This has increased in size since March 06, 2023, worrisome for enlarging neoplasm. Critical Value/emergent results were called by telephone at the time of interpretation on 11/20/2023 at 11:05 am to provider [facility] , who verbally acknowledged these results. Aortic Atherosclerosis (ICD10-I70.0). Electronically Signed   By: Rogelia Myers M.D.   On: 11/20/2023 11:11   UE VENOUS DUPLEX (7am - 7pm) Result Date: 11/20/2023 UPPER VENOUS STUDY  Patient Name:  Pamela Dunn  Date of Exam:   11/20/2023 Medical Rec #: [id number]      Accession #:    [id number] Date of Birth: March 02, 1932      Patient Gender: F  Patient Age:   6 years Exam Location:  [facility] Procedure:      VAS US  UPPER EXTREMITY VENOUS DUPLEX Referring Phys: ELSIE BODY --------------------------------------------------------------------------------  Indications: Edema Other Indications: Recent hospitalization with LUE IV. Risk Factors: DVT LUE (02/04/2023). Limitations: Poor ultrasound/tissue interface and patient unable to cooperate due to altered mental status, diffuse subcutaneous edema. Comparison Study: Previous exam on 06/05/2023 was negative for DVT Performing Technologist: Ezzie Potters RVT, RDMS  Examination Guidelines: A complete evaluation includes B-mode imaging, spectral Doppler, color Doppler, and power Doppler as needed of all accessible portions of each vessel. Bilateral testing is considered an integral part of a complete examination. Limited examinations for reoccurring indications may be performed as noted.  Right Findings:  +----------+------------+---------+-----------+----------+--------------------+ RIGHT     CompressiblePhasicitySpontaneousProperties      Summary        +----------+------------+---------+-----------+----------+--------------------+ Subclavian               Yes       Yes                   patent by                                                              color/doppler     +----------+------------+---------+-----------+----------+--------------------+  Left Findings: +----------+------------+---------+-----------+----------+--------------------+ LEFT      CompressiblePhasicitySpontaneousProperties      Summary        +----------+------------+---------+-----------+----------+--------------------+ IJV                      Yes       Yes                   patent by                                                              color/doppler     +----------+------------+---------+-----------+----------+--------------------+ Subclavian               Yes       Yes                   patent by                                                              color/doppler     +----------+------------+---------+-----------+----------+--------------------+ Axillary      Dunn       Yes       Yes                                   +----------+------------+---------+-----------+----------+--------------------+ Brachial      Dunn       Yes       Yes                                   +----------+------------+---------+-----------+----------+--------------------+  Radial                                                 Not visualized    +----------+------------+---------+-----------+----------+--------------------+ Ulnar                                                  Not visualized    +----------+------------+---------+-----------+----------+--------------------+ Cephalic                                               Not visualized     +----------+------------+---------+-----------+----------+--------------------+ Basilic       Dunn       Yes       Yes                                   +----------+------------+---------+-----------+----------+--------------------+  Summary:  Right: No evidence of thrombosis in the subclavian.  Left: Limited exam due to above stated limitations, negative for DVT and SVT in all areas visualized.  *See table(s) above for measurements and observations.  Diagnosing physician: Debby Robertson Electronically signed by Debby Robertson on 11/20/2023 at 11:05:18 AM.    Final    CT ABDOMEN PELVIS W CONTRAST Result Date: 11/20/2023 CLINICAL DATA:  Acute generalized abdominal pain. EXAM: CT ABDOMEN AND PELVIS WITH CONTRAST TECHNIQUE: Multidetector CT imaging of the abdomen and pelvis was performed using the standard protocol following bolus administration of intravenous contrast. RADIATION DOSE REDUCTION: This exam was performed according to the departmental dose-optimization program which includes automated exposure control, adjustment of the mA and/or kV according to patient size and/or use of iterative reconstruction technique. CONTRAST:  75mL OMNIPAQUE  IOHEXOL  350 MG/ML SOLN COMPARISON:  November 07, 2023. FINDINGS: Hepatobiliary: No focal liver abnormality is seen. No gallstones, gallbladder wall thickening, or biliary dilatation. Pancreas: No acute inflammation is noted. Stable ductal dilatation is noted in body of pancreas. Spleen: Normal in size without focal abnormality. Adrenals/Urinary Tract: Adrenal glands appear normal. Mild bilateral renal atrophy is noted. Left renal cyst is noted. No hydronephrosis or renal obstruction is noted. Urinary bladder is unremarkable. Stomach/Bowel: The stomach is unremarkable. Status post appendectomy. No evidence of bowel obstruction or inflammation. Vascular/Lymphatic: Aortic atherosclerosis. No enlarged abdominal or pelvic lymph nodes. Reproductive: Uterus and bilateral adnexa  are unremarkable. Other: No ascites or hernia is noted. Musculoskeletal: No acute or significant osseous findings. IMPRESSION: 1. Stable ductal dilatation is noted in body of pancreas. 2. Mild bilateral renal atrophy. 3. No acute abnormality seen in the abdomen or pelvis. 4. Aortic atherosclerosis. Aortic Atherosclerosis (ICD10-I70.0). Electronically Signed   By: Lynwood Landy Raddle M.D.   On: 11/20/2023 11:03   CT Head Wo Contrast Result Date: 11/20/2023 EXAM: CT HEAD WITHOUT CONTRAST 11/20/2023 10:48:00 AM TECHNIQUE: CT of the head was performed without the administration of intravenous contrast. Automated exposure control, iterative reconstruction, and/or weight based adjustment of the mA/kV was utilized to reduce the radiation dose to as low as reasonably achievable. COMPARISON: 01/06/2023 CLINICAL HISTORY: Memory loss; Mental status change, unknown cause. Pt arrived from  home with [facility]. Pt's aide called EMS after patient choked on her morning meds. On EMS arrival, pt had patent airway. Aide requesting pt be sent to ER for eval. Pt confused on arrival; c/o leg pain however cannot recall any injuries. FINDINGS: BRAIN AND VENTRICLES: Nonspecific hypoattenuation in the periventricular and subcortical white matter, most likely representing chronic microvascular ischemic changes. Mild generalized parenchymal volume loss. 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 ORBITS: Bilaterally lateral lens replacement. SINUSES: Complete opacification of the visualized left maxillary sinus. SOFT TISSUES AND SKULL: No acute soft tissue abnormality. No skull fracture. Bilateral mastoid effusions, left greater than right. Degenerative changes of the mandibular condyle, greater on the left. Cerclage wires noted along the posterior elements of C1 and C2. VASCULATURE: Atherosclerosis of the carotid siphons and intracranial vertebral arteries. IMPRESSION: 1. No acute intracranial abnormality. 2. Moderate  chronic microvascular ischemic changes. 3. Mild generalized parenchymal volume loss without lobar specific atrophy. 4. 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 Electronically signed by: Donnice Mania MD 11/20/2023 10:59 AM EDT RP Workstation: [id number]   DG Chest Portable 1 View Result Date: 11/20/2023 CLINICAL DATA:  89 year old female with shortness of breath and possible aspiration. EXAM: PORTABLE CHEST 1 VIEW COMPARISON:  CTA abdomen 11/07/2023.  Portable chest 01/06/2023. FINDINGS: Portable AP view at 0641 hours. Advanced Calcified aortic atherosclerosis. Chronic left chest dual lead cardiac pacemaker. Stable cardiac size and mediastinal contours. Densely opacified left lung base, new since portable exam last year, and appears related to progressive left pleural effusion since the CTA last month (small layering effusion at that time). No air bronchograms. No superimposed pneumothorax or pulmonary edema. Right lung appears stable, negative. Paucity of bowel gas in the upper abdomen. Stable visualized osseous structures. IMPRESSION: 1. Progressed Left pleural effusion since CTA last month, now moderate. 2. No other acute cardiopulmonary abnormality. Aortic Atherosclerosis (ICD10-I70.0). Electronically Signed   By: VEAR Hurst M.D.   On: 11/20/2023 06:58       Vere Diantonio M.D. [facility] 11/21/2023, 10:35 AM  Available via Epic secure chat 7am-7pm After 7 pm, please refer to night coverage provider listed on amion.

## 2023-11-21 NOTE — Progress Notes (Signed)
   11/21/23 0346  Provider Notification  Provider Name/Title Dr. Lee  Date Provider Notified 11/21/23  Time Provider Notified [phone]  Method of Notification Page  Notification Reason Change in status: Pt keeps refusing blood work, having limit vascular accessed with multiple attempts to draw blood. We may consider midline or PICC line? Pt has bilat arms swelling 4+ pitting edema, gets bruising easily. Requires frequent blood work like Heparin  level.  Provider response Evaluate remotely;See new order requesting for midline accessed. We will notify IV team if they can get a midline at this time.   Date of Provider Response 11/21/23  Time of Provider Response 0352   Wendi Dash, RN

## 2023-11-21 NOTE — Progress Notes (Addendum)
 Pt again refuses blood drawn for BMP and CBC at this time. Phlebotomist will try again later at 8 am with timed Heparin  level.   Pt has limit vascular accessed and she keeps refusing lab drawn and she has to get frequent blood work. We may consider midline or PICC line if appropriate for this case. We will hand off to the day team in the morning round for evaluation.    Wendi Dash, RN

## 2023-11-21 NOTE — Progress Notes (Signed)
 PT Cancellation Note  Patient Details Name: Pamela Dunn MRN: [id number] DOB: [birth year]   Cancelled Treatment:    Reason Eval/Treat Not Completed: Medical issues which prohibited therapy, pt with acute DVT and heparin  has been initiated <24 hours. Per dept protocol and discussion with MD, will hold until pt anticoagulated for >24hrs.   Izetta Call, PT, DPT   Acute Rehabilitation Department Office [phone] Secure Chat Communication Preferred   Izetta JULIANNA Call 11/21/2023, 12:23 PM

## 2023-11-21 NOTE — Progress Notes (Signed)
 Family members in room this morning found to have turned off heparin  gtt due to the machine beeping. Family members state they cannot feed pt due to them being scared of patient choking. RN stated that there would be a staff member to help pt eat in just a few moments and when RN returned to room-family members were feeding pt. Family members placed rolling bedside tray outside in the hallway with leftover food/beverages on it. Will continue to care for patient and try to keep up with family requests during this shift. Khali Albanese S Khyan Oats

## 2023-11-21 NOTE — Consult Note (Signed)
 CONSULT NOTE    Patient ID: RIM THATCH MRN: [id number], DOB/AGE: 1931/12/26 89 y.o.  Admit date: 11/20/2023 Date of Consult: 11/21/2023  Primary Physician: Gerome Brunet, DO Primary Cardiologist: Gordy Bergamo, MD  Electrophysiologist: Dr. Kennyth   Referring Provider: Dr. Davia  Patient Profile: Pamela Dunn is a 89 y.o. female with a history of HTN, chronic combined CHF (NICM), LBBB, HLD, CHB s/p dual-chamber PPM 07/2020, CKD stage IIIa, rheumatoid arthritis who is being seen today for the evaluation of anasarca at the request of Dr. Davia.  HPI:   Patient presented to the hospital on 9/12 with worsening confusion, report from family of choking on pills. This followed recent admission 08/30-09/01 with rectal bleeding and acute blood loss anemia. She was evaluated by GI and was found with nonbleeding gastric and duodenal ulcers, ultimately started Protonix . Following d/c, patient's family noted increased confusion (to the point that she did not recognize them) as well as an episode of choking. No focal symptoms such as dyspnea, fever, cough. In the ED, labs revealed hyponatremia with sodium 121. HGB 11.4, lipase 339, BNP 441. Troponin minimally elevated and flat 60->51. Lactic acid 1.4. COVID-19 test positive. CXR with left pleural effusion, enlarged in comparison to imaging last admission. CTA chest with single small segmental PE in RLL with no evidence of heart strain. CT also noted lobulated nodule measuring 1.4 x 1.1 (serial increase in size since December worrisome for enlarging neoplasm. Patient also with acute RLE DVT. Given hypervolemic appearance of patient and elevated BNP with history of CHF, cardiology asked to see patient.   On exam today, patient acutely confused but son at bedside able to contribute to HPI. He reports noticing left upper extremity swelling after recent hospital admission. Last week, home health nurse felt that patient generally appeared edematous and oral lasix   started. Family does not report significant change in edema on Lasix .    Labs Potassium3.8 (09/13 9488)   Creatinine, ser  0.88 (09/13 0511) PLT  378 (09/13 0511) HGB  10.5* (09/13 0511) WBC 7.8 (09/13 0511) Troponin I (High Sensitivity)51* (09/12 1005).    Past Medical History:  Diagnosis Date   Allergy    seasonal   Anemia    Arthritis    Encounter for care of pacemaker 07/12/2020   GERD (gastroesophageal reflux disease)    Heart block AV complete (HCC) 07/11/2020   Hyperlipidemia    Hypertension    Pacemaker Abbott Assurity MRI model [id number] 07/12/2020 07/12/2020     Surgical History:  Past Surgical History:  Procedure Laterality Date   APPENDECTOMY     BACK SURGERY     BOWEL RESECTION N/A 08/24/2019   Procedure: SMALL BOWEL RESECTION;  Surgeon: Vernetta Berg, MD;  Location: [facility];  Service: General;  Laterality: N/A;   ESOPHAGOGASTRODUODENOSCOPY N/A 11/08/2023   Procedure: EGD (ESOPHAGOGASTRODUODENOSCOPY);  Surgeon: Rollin Dover, MD;  Location: [facility] ENDOSCOPY;  Service: Gastroenterology;  Laterality: N/A;   LAPAROSCOPY N/A 08/24/2019   Procedure: LAPAROSCOPY DIAGNOSTIC;  Surgeon: Vernetta Berg, MD;  Location: [facility];  Service: General;  Laterality: N/A;   LAPAROTOMY N/A 08/24/2019   Procedure: EXPLORATORY LAPAROTOMY;  Surgeon: Vernetta Berg, MD;  Location: [facility];  Service: General;  Laterality: N/A;   PACEMAKER IMPLANT N/A 07/12/2020   Procedure: PACEMAKER IMPLANT;  Surgeon: Kelsie Agent, MD;  Location: [facility] INVASIVE CV LAB;  Service: Cardiovascular;  Laterality: N/A;     Medications Prior to Admission  Medication Sig Dispense Refill Last Dose/Taking   cholecalciferol  (VITAMIN  D) 1000 UNITS tablet Take 1,000 Units by mouth daily.    11/19/2023   ezetimibe  (ZETIA ) 10 MG tablet Take 10 mg by mouth daily.    11/19/2023   folic acid  (FOLVITE ) 1 MG tablet Take 1 mg by mouth 2 (two) times daily.   11/19/2023   furosemide  (LASIX ) 20 MG tablet TAKE 1 TABLET BY MOUTH  EVERY DAY AS NEEDED (Patient taking differently: Take 20 mg by mouth daily.) 90 tablet 3 11/19/2023   Glycerin , Laxative, (ADULT SUPPOSITORY RE) Place 1 Dose rectally daily as needed (Constipation).   Past Week   guaiFENesin  (MUCINEX ) 600 MG 12 hr tablet Take 600 mg by mouth 2 (two) times daily as needed for cough or to loosen phlegm.   Unknown   isosorbide -hydrALAZINE  (BIDIL ) 20-37.5 MG tablet Take 1 tablet by mouth 3 (three) times daily. 270 tablet 3 11/19/2023   levothyroxine  (SYNTHROID ) 25 MCG tablet Take 25 mcg by mouth daily.    11/19/2023   metoprolol  succinate (TOPROL -XL) 25 MG 24 hr tablet Take 1 tablet (25 mg total) by mouth daily. 90 tablet 3 11/19/2023   Multiple Vitamins-Minerals (PRESERVISION AREDS PO) Take 1 Dose by mouth 2 (two) times daily.   Past Week   Omega-3 Fatty Acids (OMEGA-3 FISH OIL PO) Take 1 capsule by mouth in the morning and at bedtime.   11/19/2023   omeprazole  (PRILOSEC) 40 MG capsule Take 1 capsule (40 mg total) by mouth 2 (two) times daily for 30 days, THEN 1 capsule (40 mg total) daily. 90 capsule 0 11/19/2023   polyethylene glycol (MIRALAX  / GLYCOLAX ) 17 g packet Take 17 g by mouth daily as needed for moderate constipation.   Unknown   predniSONE  (DELTASONE ) 5 MG tablet Take 5 mg by mouth daily.   11/19/2023    Inpatient Medications:   ezetimibe   10 mg Oral Daily   folic acid   1 mg Oral BID   furosemide   40 mg Intravenous BID   isosorbide -hydrALAZINE   1 tablet Oral TID   levothyroxine   25 mcg Oral Q0600   metoprolol  succinate  25 mg Oral Daily   pantoprazole   40 mg Oral BID   potassium chloride   40 mEq Oral BID   predniSONE   5 mg Oral Daily   sodium chloride  flush  10-40 mL Intracatheter Q12H   sodium chloride  flush  3 mL Intravenous Q12H    Allergies:  Allergies  Allergen Reactions   Coreg  [Carvedilol ] Diarrhea   Lorazepam Diarrhea    Family History  Problem Relation Age of Onset   Cancer Mother    Cancer - Colon Sister    Heart disease Sister     Brain cancer Brother    Cancer - Colon Brother    Cancer - Colon Brother    Breast cancer Maternal Aunt      Physical Exam: Vitals:   11/20/23 2200 11/21/23 0052 11/21/23 0410 11/21/23 0810  BP: 115/86 (!) 116/36 (!) 121/43 136/74  Pulse: 63 64 66 64  Resp: 15 18 16 20   Temp:  97.7 F (36.5 C) 97.8 F (36.6 C) 97.7 F (36.5 C)  TempSrc:  Oral Oral Oral  SpO2: 98% 98% 97% 94%  Weight:      Height:        GEN- NAD, acutely confused HEENT: Normocephalic, atraumatic Lungs- notably diminished breath sounds left lung.  Heart- Regular rate and rhythm, No M/G/R.  GI- Soft, NT, ND.  Extremities- non-pitting edema in upper and lower extremities.    Radiology/Studies: VAS  US  LOWER EXTREMITY VENOUS (DVT) Result Date: 11/21/2023  Lower Venous DVT Study Patient Name:  DYNVER CLEMSON  Date of Exam:   11/20/2023 Medical Rec #: [id number]      Accession #:    [id number] Date of Birth: 04-23-1931      Patient Gender: F Patient Age:   89 years Exam Location:  [facility] Procedure:      VAS US  LOWER EXTREMITY VENOUS (DVT) Referring Phys: MAXIMINO SHARPS --------------------------------------------------------------------------------  Indications: Pulmonary embolism.  Risk Factors: Confirmed PE. Limitations: Poor ultrasound/tissue interface, body habitus and patient pain tolerance, patient movement. Comparison Study: No prior studies. Performing Technologist: Cordella Collet RVT  Examination Guidelines: A complete evaluation includes B-mode imaging, spectral Doppler, color Doppler, and power Doppler as needed of all accessible portions of each vessel. Bilateral testing is considered an integral part of a complete examination. Limited examinations for reoccurring indications may be performed as noted. The reflux portion of the exam is performed with the patient in reverse Trendelenburg.  +---------+---------------+---------+-----------+----------+-------------------+ RIGHT     CompressibilityPhasicitySpontaneityPropertiesThrombus Aging      +---------+---------------+---------+-----------+----------+-------------------+ CFV      Partial        Yes      Yes                  Acute               +---------+---------------+---------+-----------+----------+-------------------+ SFJ      Full                                                             +---------+---------------+---------+-----------+----------+-------------------+ FV Prox  Partial        Yes      Yes                  Acute               +---------+---------------+---------+-----------+----------+-------------------+ FV Mid   Partial        No       No                   Acute               +---------+---------------+---------+-----------+----------+-------------------+ FV DistalPartial        No       No                   Acute               +---------+---------------+---------+-----------+----------+-------------------+ POP      Full           No       No                                       +---------+---------------+---------+-----------+----------+-------------------+ PTV                                                   Not well visualized +---------+---------------+---------+-----------+----------+-------------------+ PERO  Not well visualized +---------+---------------+---------+-----------+----------+-------------------+   +---------+---------------+---------+-----------+----------+-------------------+ LEFT     CompressibilityPhasicitySpontaneityPropertiesThrombus Aging      +---------+---------------+---------+-----------+----------+-------------------+ CFV      Full           Yes      Yes                                      +---------+---------------+---------+-----------+----------+-------------------+ SFJ      Full                                                              +---------+---------------+---------+-----------+----------+-------------------+ FV Prox  Full                                                             +---------+---------------+---------+-----------+----------+-------------------+ FV Mid   Full                                                             +---------+---------------+---------+-----------+----------+-------------------+ FV Distal               Yes      Yes                                      +---------+---------------+---------+-----------+----------+-------------------+ PFV      Full                                                             +---------+---------------+---------+-----------+----------+-------------------+ POP      Full           Yes      Yes                                      +---------+---------------+---------+-----------+----------+-------------------+ PTV      Full                                                             +---------+---------------+---------+-----------+----------+-------------------+ PERO                                                  Not well visualized +---------+---------------+---------+-----------+----------+-------------------+     Summary:  RIGHT: - Findings consistent with acute deep vein thrombosis involving the right common femoral vein, and right femoral vein.  - No cystic structure found in the popliteal fossa.  LEFT: - There is no evidence of deep vein thrombosis in the lower extremity. However, portions of this examination were limited- see technologist comments above.  - No cystic structure found in the popliteal fossa.  *See table(s) above for measurements and observations. Electronically signed by Fonda Rim on 11/21/2023 at 8:28:47 AM.    Final    ECHOCARDIOGRAM COMPLETE Result Date: 11/20/2023    ECHOCARDIOGRAM REPORT   Patient Name:   SHEVAWN LANGENBERG Date of Exam: 11/20/2023 Medical Rec #:  [id number]     Height:       60.0 in  Accession #:    [id number]    Weight:       144.6 lb Date of Birth:  [birth year]     BSA:          1.626 m Patient Age:    89 years      BP:           171/80 mmHg Patient Gender: F             HR:           63 bpm. Exam Location:  Inpatient Procedure: 2D Echo, Cardiac Doppler and Color Doppler (Both Spectral and Color            Flow Doppler were utilized during procedure). Indications:    Pulmonary Embolus I26.09  History:        Patient has prior history of Echocardiogram examinations, most                 recent 06/11/2021. CHF; Risk Factors:Dyslipidemia.  Sonographer:    Thea Norlander RCS Referring Phys: [phone] RONDELL A SMITH IMPRESSIONS  1. Left ventricular ejection fraction, by estimation, is 45 to 50%. The left ventricle has mildly decreased function. The left ventricle demonstrates regional wall motion abnormalities. There is moderate asymmetric left ventricular hypertrophy of the basal-septal segment. Left ventricular diastolic parameters are indeterminate. Abnormal (paradoxical) septal motion, consistent with RV pacemaker  2. Right ventricular systolic function is normal. The right ventricular size is normal.  3. Moderate pleural effusion in the left lateral region.  4. The mitral valve is degenerative. Mild mitral valve regurgitation. No evidence of mitral stenosis.  5. Tricuspid valve regurgitation is moderate.  6. The aortic valve is tricuspid. Aortic valve regurgitation is not visualized. No aortic stenosis is present.  7. The inferior vena cava is normal in size with greater than 50% respiratory variability, suggesting right atrial pressure of 3 mmHg. FINDINGS  Left Ventricle: Left ventricular ejection fraction, by estimation, is 45 to 50%. The left ventricle has mildly decreased function. The left ventricle demonstrates regional wall motion abnormalities. The left ventricular internal cavity size was small. There is moderate asymmetric left ventricular hypertrophy of the basal-septal segment.  Abnormal (paradoxical) septal motion, consistent with RV pacemaker. Left ventricular diastolic parameters are indeterminate. Right Ventricle: The right ventricular size is normal. No increase in right ventricular wall thickness. Right ventricular systolic function is normal. Left Atrium: Left atrial size was normal in size. Right Atrium: Right atrial size was normal in size. Pericardium: Trivial pericardial effusion is present. Mitral Valve: The mitral valve is degenerative in appearance. Mild mitral valve regurgitation. No evidence of mitral valve stenosis. MV peak gradient, 4.3 mmHg. The mean mitral valve gradient is 2.0 mmHg. Tricuspid Valve: The  tricuspid valve is normal in structure. Tricuspid valve regurgitation is moderate. Aortic Valve: The aortic valve is tricuspid. Aortic valve regurgitation is not visualized. No aortic stenosis is present. Aortic valve peak gradient measures 4.9 mmHg. Pulmonic Valve: The pulmonic valve was not well visualized. Pulmonic valve regurgitation is trivial. Aorta: The aortic root and ascending aorta are structurally normal, with no evidence of dilitation. Venous: The inferior vena cava is normal in size with greater than 50% respiratory variability, suggesting right atrial pressure of 3 mmHg. IAS/Shunts: The interatrial septum was not well visualized. Additional Comments: There is a moderate pleural effusion in the left lateral region.  LEFT VENTRICLE PLAX 2D LVIDd:         3.50 cm   Diastology LVIDs:         2.60 cm   LV e' medial:  4.90 cm/s LV PW:         0.80 cm   LV e' lateral: 4.35 cm/s LV IVS:        1.00 cm LVOT diam:     1.90 cm LV SV:         51 LV SV Index:   31 LVOT Area:     2.84 cm  RIGHT VENTRICLE             IVC RV S prime:     10.10 cm/s  IVC diam: 1.50 cm TAPSE (M-mode): 2.0 cm LEFT ATRIUM           Index        RIGHT ATRIUM           Index LA diam:      2.50 cm 1.54 cm/m   RA Area:     11.90 cm LA Vol (A4C): 23.5 ml 14.45 ml/m  RA Volume:   24.90 ml  15.31  ml/m  AORTIC VALVE AV Area (Vmax): 2.28 cm AV Vmax:        111.00 cm/s AV Peak Grad:   4.9 mmHg LVOT Vmax:      89.10 cm/s LVOT Vmean:     54.500 cm/s LVOT VTI:       0.179 m  AORTA Ao Root diam: 2.90 cm Ao Asc diam:  3.40 cm MITRAL VALVE MV Area VTI:  1.78 cm   SHUNTS MV Peak grad: 4.3 mmHg   Systemic VTI:  0.18 m MV Mean grad: 2.0 mmHg   Systemic Diam: 1.90 cm MV Vmax:      1.04 m/s MV Vmean:     70.8 cm/s Lonni Nanas MD Electronically signed by Lonni Nanas MD Signature Date/Time: 11/20/2023/9:07:39 PM    Final    CT Angio Chest Pulmonary Embolism (PE) W or WO Contrast Result Date: 11/20/2023 CLINICAL DATA:  Pulmonary embolism (PE) suspected, high prob EXAM: CT ANGIOGRAPHY CHEST WITH CONTRAST TECHNIQUE: Multidetector CT imaging of the chest was performed using the standard protocol during bolus administration of intravenous contrast. Multiplanar CT image reconstructions and MIPs were obtained to evaluate the vascular anatomy. RADIATION DOSE REDUCTION: This exam was performed according to the departmental dose-optimization program which includes automated exposure control, adjustment of the mA and/or kV according to patient size and/or use of iterative reconstruction technique. CONTRAST:  75mL OMNIPAQUE  IOHEXOL  350 MG/ML SOLN COMPARISON:  09/14/2023, 03/06/2023 FINDINGS: Pulmonary Embolism: Single segmental pulmonary embolus within the right lower lobe. The RV-LV ratio is less than 1. No findings of right heart strain. Cardiovascular: No cardiomegaly or pericardial effusion.Left chest pacemaker/AICD with leads terminating in the right atrium and right  ventricle.No aortic aneurysm. Diffuse aortic and dense multi-vessel coronary atherosclerosis. Mediastinum/Nodes: No mediastinal mass.Small hiatal hernia.No mediastinal, hilar, or axillary lymphadenopathy. Lungs/Pleura: The midline trachea and bronchi are patent. Biapical pleuroparenchymal scarring. Moderate volume left pleural effusion, which  may be partially loculated in the more superior aspects, with near complete compressive atelectasis of the left lower lobe. Small right pleural effusion. No pneumothorax. Unchanged posterior right lung apex nodules, measuring 4 mm and 6 mm respectively (axial 45 and 50). Lobular nodule in the posterior right upper lobe, measuring 1.4 x 1.1 cm (axial 62), increased in size in the interim, measuring 6 mm previously. Unchanged 4.5 mm anterobasal right lower lobe nodule (axial 106). Musculoskeletal: Diffuse osteopenia. No acute fracture or destructive bone lesion. Moderate osteoarthritis of the right glenohumeral joint. Left shoulder arthroplasty is anatomically aligned without dislocation. Multilevel degenerative disc disease of the spine. Upper Abdomen: No acute abnormality in the partially visualized upper abdomen. Review of the MIP images confirms the above findings. IMPRESSION: 1. Single small segmental pulmonary embolus in the right lower lobe. No findings of right heart strain. 2. Moderate volume left pleural effusion, which may be partially loculated in the more superior aspect. Dense consolidation filling the majority of the left lower lobe, favored to represent near complete compressive atelectasis. 3. Lobular nodule in the posterior right upper lobe (axial 62), measuring 1.4 x 1.1 cm. This has increased in size since March 06, 2023, worrisome for enlarging neoplasm. Critical Value/emergent results were called by telephone at the time of interpretation on 11/20/2023 at 11:05 am to provider [facility] , who verbally acknowledged these results. Aortic Atherosclerosis (ICD10-I70.0). Electronically Signed   By: Rogelia Myers M.D.   On: 11/20/2023 11:11   UE VENOUS DUPLEX (7am - 7pm) Result Date: 11/20/2023 UPPER VENOUS STUDY  Patient Name:  JAYMEE TILSON  Date of Exam:   11/20/2023 Medical Rec #: [id number]      Accession #:    [id number] Date of Birth: 1931/10/31      Patient Gender: F Patient Age:   39  years Exam Location:  [facility] Procedure:      VAS US  UPPER EXTREMITY VENOUS DUPLEX Referring Phys: ELSIE BODY --------------------------------------------------------------------------------  Indications: Edema Other Indications: Recent hospitalization with LUE IV. Risk Factors: DVT LUE (02/04/2023). Limitations: Poor ultrasound/tissue interface and patient unable to cooperate due to altered mental status, diffuse subcutaneous edema. Comparison Study: Previous exam on 06/05/2023 was negative for DVT Performing Technologist: Ezzie Potters RVT, RDMS  Examination Guidelines: A complete evaluation includes B-mode imaging, spectral Doppler, color Doppler, and power Doppler as needed of all accessible portions of each vessel. Bilateral testing is considered an integral part of a complete examination. Limited examinations for reoccurring indications may be performed as noted.  Right Findings: +----------+------------+---------+-----------+----------+--------------------+ RIGHT     CompressiblePhasicitySpontaneousProperties      Summary        +----------+------------+---------+-----------+----------+--------------------+ Subclavian               Yes       Yes                   patent by                                                              color/doppler     +----------+------------+---------+-----------+----------+--------------------+  Left Findings: +----------+------------+---------+-----------+----------+--------------------+ LEFT      CompressiblePhasicitySpontaneousProperties      Summary        +----------+------------+---------+-----------+----------+--------------------+ IJV                      Yes       Yes                   patent by                                                              color/doppler     +----------+------------+---------+-----------+----------+--------------------+ Subclavian               Yes       Yes                    patent by                                                              color/doppler     +----------+------------+---------+-----------+----------+--------------------+ Axillary      Full       Yes       Yes                                   +----------+------------+---------+-----------+----------+--------------------+ Brachial      Full       Yes       Yes                                   +----------+------------+---------+-----------+----------+--------------------+ Radial                                                 Not visualized    +----------+------------+---------+-----------+----------+--------------------+ Ulnar                                                  Not visualized    +----------+------------+---------+-----------+----------+--------------------+ Cephalic                                               Not visualized    +----------+------------+---------+-----------+----------+--------------------+ Basilic       Full       Yes       Yes                                   +----------+------------+---------+-----------+----------+--------------------+  Summary:  Right: No evidence of thrombosis in the subclavian.  Left: Limited exam  due to above stated limitations, negative for DVT and SVT in all areas visualized.  *See table(s) above for measurements and observations.  Diagnosing physician: Debby Robertson Electronically signed by Debby Robertson on 11/20/2023 at 11:05:18 AM.    Final    CT ABDOMEN PELVIS W CONTRAST Result Date: 11/20/2023 CLINICAL DATA:  Acute generalized abdominal pain. EXAM: CT ABDOMEN AND PELVIS WITH CONTRAST TECHNIQUE: Multidetector CT imaging of the abdomen and pelvis was performed using the standard protocol following bolus administration of intravenous contrast. RADIATION DOSE REDUCTION: This exam was performed according to the departmental dose-optimization program which includes automated exposure control, adjustment of  the mA and/or kV according to patient size and/or use of iterative reconstruction technique. CONTRAST:  75mL OMNIPAQUE  IOHEXOL  350 MG/ML SOLN COMPARISON:  November 07, 2023. FINDINGS: Hepatobiliary: No focal liver abnormality is seen. No gallstones, gallbladder wall thickening, or biliary dilatation. Pancreas: No acute inflammation is noted. Stable ductal dilatation is noted in body of pancreas. Spleen: Normal in size without focal abnormality. Adrenals/Urinary Tract: Adrenal glands appear normal. Mild bilateral renal atrophy is noted. Left renal cyst is noted. No hydronephrosis or renal obstruction is noted. Urinary bladder is unremarkable. Stomach/Bowel: The stomach is unremarkable. Status post appendectomy. No evidence of bowel obstruction or inflammation. Vascular/Lymphatic: Aortic atherosclerosis. No enlarged abdominal or pelvic lymph nodes. Reproductive: Uterus and bilateral adnexa are unremarkable. Other: No ascites or hernia is noted. Musculoskeletal: No acute or significant osseous findings. IMPRESSION: 1. Stable ductal dilatation is noted in body of pancreas. 2. Mild bilateral renal atrophy. 3. No acute abnormality seen in the abdomen or pelvis. 4. Aortic atherosclerosis. Aortic Atherosclerosis (ICD10-I70.0). Electronically Signed   By: Lynwood Landy Raddle M.D.   On: 11/20/2023 11:03   CT Head Wo Contrast Result Date: 11/20/2023 EXAM: CT HEAD WITHOUT CONTRAST 11/20/2023 10:48:00 AM TECHNIQUE: CT of the head was performed without the administration of intravenous contrast. Automated exposure control, iterative reconstruction, and/or weight based adjustment of the mA/kV was utilized to reduce the radiation dose to as low as reasonably achievable. COMPARISON: 01/06/2023 CLINICAL HISTORY: Memory loss; Mental status change, unknown cause. Pt arrived from home with [facility]. Pt's aide called EMS after patient choked on her morning meds. On EMS arrival, pt had patent airway. Aide requesting pt be sent to ER for eval.  Pt confused on arrival; c/o leg pain however cannot recall any injuries. FINDINGS: BRAIN AND VENTRICLES: Nonspecific hypoattenuation in the periventricular and subcortical white matter, most likely representing chronic microvascular ischemic changes. Mild generalized parenchymal volume loss. 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 ORBITS: Bilaterally lateral lens replacement. SINUSES: Complete opacification of the visualized left maxillary sinus. SOFT TISSUES AND SKULL: No acute soft tissue abnormality. No skull fracture. Bilateral mastoid effusions, left greater than right. Degenerative changes of the mandibular condyle, greater on the left. Cerclage wires noted along the posterior elements of C1 and C2. VASCULATURE: Atherosclerosis of the carotid siphons and intracranial vertebral arteries. IMPRESSION: 1. No acute intracranial abnormality. 2. Moderate chronic microvascular ischemic changes. 3. Mild generalized parenchymal volume loss without lobar specific atrophy. 4. 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 Electronically signed by: Donnice Mania MD 11/20/2023 10:59 AM EDT RP Workstation: [id number]   DG Chest Portable 1 View Result Date: 11/20/2023 CLINICAL DATA:  89 year old female with shortness of breath and possible aspiration. EXAM: PORTABLE CHEST 1 VIEW COMPARISON:  CTA abdomen 11/07/2023.  Portable chest 01/06/2023. FINDINGS: Portable AP view at 0641 hours. Advanced Calcified aortic atherosclerosis. Chronic left  chest dual lead cardiac pacemaker. Stable cardiac size and mediastinal contours. Densely opacified left lung base, new since portable exam last year, and appears related to progressive left pleural effusion since the CTA last month (small layering effusion at that time). No air bronchograms. No superimposed pneumothorax or pulmonary edema. Right lung appears stable, negative. Paucity of bowel gas in the upper abdomen. Stable  visualized osseous structures. IMPRESSION: 1. Progressed Left pleural effusion since CTA last month, now moderate. 2. No other acute cardiopulmonary abnormality. Aortic Atherosclerosis (ICD10-I70.0). Electronically Signed   By: VEAR Hurst M.D.   On: 11/20/2023 06:58   CT ANGIO GI BLEED Result Date: 11/07/2023 EXAM: CTA ABDOMEN AND PELVIS WITH CONTRAST 11/07/2023 01:22:13 PM TECHNIQUE: CTA images of the abdomen and pelvis with intravenous contrast. Three-dimensional MIP/volume rendered formations were performed. Automated exposure control, iterative reconstruction, and/or weight based adjustment of the mA/kV was utilized to reduce the radiation dose to as low as reasonably achievable. COMPARISON: CT 01/06/2023 CLINICAL HISTORY: GI bleed. FINDINGS: VASCULATURE: Extensive calcified aortoiliac atheromatous plaque without aneurysm. Calcified plaque at the origin of the right renal artery resulting in short segment stenosis of at least moderate severity, patent distally. Duplicated left renal arteries, superior dominant, both patent. GI BLEED: No evidence of active GI bleed or acute GI pathology. AORTA: No acute finding. No abdominal aortic aneurysm. No dissection. CELIAC TRUNK: No acute finding. No occlusion or significant stenosis. SUPERIOR MESEMTRIC ARTERY: No acute finding. No occlusion or significant stenosis. INFERIOR MESENTERIC ARTERY: No acute finding. No occlusion or significant stenosis. RENAL ARTERIES: Calcified plaque at the origin of the right renal artery resulting in short segment stenosis of at least moderate severity, patent distally. Duplicated left renal arteries, superior dominant, both patent. ILIAC ARTERIES: No acute finding. Extensive calcified plaque. No occlusion or significant stenosis. ABDOMEN/PELVIS: LOWER CHEST: New small left pleural effusion with dependent consolidation/atelectasis posteriorly in the left lower lobe. There is some patchy opacities peripherally at the right lung base.  Transvenous pacing leads partially visualized. LIVER: The liver is unremarkable. GALLBLADDER AND BILE DUCTS: Subcentimeter partially calcified gallstone in the dependent aspect of the nondilated gallbladder. No biliary ductal dilatation. SPLEEN: The spleen is unremarkable. PANCREAS: main duct dilatation in the pancreatic body, progressive since prior exam without discrete obstructing lesion or regional inflammatory changes. ADRENAL GLANDS: Bilateral adrenal glands demonstrate no acute abnormality. KIDNEYS, URETERS AND BLADDER: Stable left upper pole renal cortical cyst. No stones in the kidneys or ureters. No hydronephrosis. No perinephric or periureteral stranding. Urinary bladder is unremarkable. GI AND BOWEL: Anastomotic staple lines in the right lower quadrant. Small hiatal hernia. A few small diverticula near the descending/sigmoid junction without adjacent inflammatory change. No evidence of active GI bleed or acute GI pathology. REPRODUCTIVE: Reproductive organs are unremarkable. PERITONEUM AND RETROPERITONEUM: No ascites or free air. LYMPH NODES: No lymphadenopathy. BONES AND SOFT TISSUES: Bilateral hip DJD. Multilevel lumbar spondylitic change. No acute abnormality of the bones. No acute soft tissue abnormality. IMPRESSION: 1. No evidence of active GI bleed or acute GI pathology. 2. Main duct dilatation in the pancreatic body, progressive since prior exam without discrete obstructing lesion or regional inflammatory changes. Elective MRCP or ERCP may be useful for further evaluation. 3. New small left pleural effusion with dependent consolidation/atelectasis posteriorly in the left lower lobe. Patchy opacities peripherally at the right lung base. Electronically signed by: Dayne Hassell MD 11/07/2023 01:42 PM EDT RP Workstation: [id number]    EKG: AV dual paced (personally reviewed)  TELEMETRY: AV dual paced (personally reviewed)  DEVICE HISTORY: ST JUDE dual chamber PPM  07/12/2020.  Assessment/Plan:  Acute on chronic HFrEF/NICM Anasarca with hyponatremia 2D echo completed 2022 showing EF of 45% and mildly decreased LV function with grade 1 DD. Echo 11/20/23 TTE with LVEF 45-50%, abnormal septal motion consistent with RV PPM. Patient with anasarca, left side pleural effusion, hyponatremia, BNP 441. Hyponatremia etiology remains unclear. With urine osmolality of 320 and random urine sodium of 76, question if there could be a component of secondary adrenal insufficiency in setting of chronic low dose Prednisone  and recent acute illness, could consider stress dose steroids. Should also consider SIADH with lobular nodule in posterior RUL that may represent malignancy.  BNP has improved with diuresis, continue IV lasix  with close monitoring of I/O. Agree with primary team plan to consult IR re left thoracentesis.  Continue Metoprolol  Succinate and Bidil . Appears that GDMT historically limited by CKD though this admission and on recent checks, renal function normal. Consider titrating GDMT, adding MRA +/- SGLT2.  Complete Heart Block s/p dual chamber PPM Stable device function on recent reports.   Hypertension BP at goal. Continue home regimen.  Acute PE Acute RLE DVT Patient with hx DVT found to have small subsegmental PE in RLL. No CT evidence of right heart strain.  IV heparin  per primary team.  Pulmonary nodule Patient with lobular nodule in posterior RUL noted to have increased in size from December imaging. Per Dr. Davia, patient's family does not want further workup of this.   For questions or updates, please contact [facility] Please consult [email or web address] for contact info under     Signed, Artist Pouch, PA-C  11/21/2023, 11:25 AM

## 2023-11-21 NOTE — Progress Notes (Signed)
 PHARMACY - PHYSICIAN COMMUNICATION CRITICAL VALUE ALERT - BLOOD CULTURE IDENTIFICATION (BCID)  [name] is an 89 y.o. female who presented to [facility] on 11/20/2023 with a chief complaint of worsening confusion found to be COVID-19 +, no signs of PNA per CXR.  Assessment:  1/4 blood cultures with staph epi (mecA gene detected) -WBC WNL,afebrile  Name of physician (or Provider) Contacted: Dr. Davia  Current antibiotics: None  Changes to prescribed antibiotics recommended:   -Contaminate; continue to monitor patient off antibiotics  Results for orders placed or performed during the hospital encounter of 11/20/23  Blood Culture ID Panel (Reflexed) (Collected: 11/20/2023  8:30 AM)  Result Value Ref Range   Enterococcus faecalis NOT DETECTED NOT DETECTED   Enterococcus Faecium NOT DETECTED NOT DETECTED   Listeria monocytogenes NOT DETECTED NOT DETECTED   Staphylococcus species DETECTED (A) NOT DETECTED   Staphylococcus aureus (BCID) NOT DETECTED NOT DETECTED   Staphylococcus epidermidis DETECTED (A) NOT DETECTED   Staphylococcus lugdunensis NOT DETECTED NOT DETECTED   Streptococcus species NOT DETECTED NOT DETECTED   Streptococcus agalactiae NOT DETECTED NOT DETECTED   Streptococcus pneumoniae NOT DETECTED NOT DETECTED   Streptococcus pyogenes NOT DETECTED NOT DETECTED   A.calcoaceticus-baumannii NOT DETECTED NOT DETECTED   Bacteroides fragilis NOT DETECTED NOT DETECTED   Enterobacterales NOT DETECTED NOT DETECTED   Enterobacter cloacae complex NOT DETECTED NOT DETECTED   Escherichia coli NOT DETECTED NOT DETECTED   Klebsiella aerogenes NOT DETECTED NOT DETECTED   Klebsiella oxytoca NOT DETECTED NOT DETECTED   Klebsiella pneumoniae NOT DETECTED NOT DETECTED   Proteus species NOT DETECTED NOT DETECTED   Salmonella species NOT DETECTED NOT DETECTED   Serratia marcescens NOT DETECTED NOT DETECTED   Haemophilus influenzae NOT DETECTED NOT DETECTED   Neisseria meningitidis NOT  DETECTED NOT DETECTED   Pseudomonas aeruginosa NOT DETECTED NOT DETECTED   Stenotrophomonas maltophilia NOT DETECTED NOT DETECTED   Candida albicans NOT DETECTED NOT DETECTED   Candida auris NOT DETECTED NOT DETECTED   Candida glabrata NOT DETECTED NOT DETECTED   Candida krusei NOT DETECTED NOT DETECTED   Candida parapsilosis NOT DETECTED NOT DETECTED   Candida tropicalis NOT DETECTED NOT DETECTED   Cryptococcus neoformans/gattii NOT DETECTED NOT DETECTED   Methicillin resistance mecA/C DETECTED (A) NOT DETECTED    Lynwood Poplar, PharmD, BCPS Clinical Pharmacist 11/21/2023 7:01 AM

## 2023-11-21 NOTE — Progress Notes (Signed)
 PHARMACY - ANTICOAGULATION CONSULT NOTE  Pharmacy Consult for Heparin  Indication: pulmonary embolus and DVT  Allergies  Allergen Reactions   Coreg  [Carvedilol ] Diarrhea   Lorazepam Diarrhea    Patient Measurements: Height: 5' (152.4 cm) Weight: 65.6 kg (144 lb 10 oz) IBW/kg (Calculated) : 45.5 HEPARIN  DW (KG): 59.5  Vital Signs: Temp: 97.8 F (36.6 C) (09/13 1523) Temp Source: Oral (09/13 1523) BP: 125/57 (09/13 1523) Pulse Rate: 64 (09/13 0810)  Labs: Recent Labs    11/20/23 0815 11/20/23 1004 11/20/23 1005 11/20/23 1016 11/20/23 2355 11/21/23 0511 11/21/23 0555 11/21/23 1042 11/21/23 1700  HGB 11.4*  --   --  11.2*  --  10.5*  --   --   --   HCT 33.5*  --   --  33.0*  --  30.1*  --   --   --   PLT 381  --   --   --   --  378  --   --   --   HEPARINUNFRC  --   --   --   --  0.83*  --  0.81*  --  0.64  CREATININE  --    < >  --  1.20* 0.84 0.88  --  0.91  --   TROPONINIHS 60*  --  51*  --   --   --   --   --   --    < > = values in this interval not displayed.    Estimated Creatinine Clearance: 34.7 mL/min (by C-G formula based on SCr of 0.91 mg/dL).   Medical History: Past Medical History:  Diagnosis Date   Allergy    seasonal   Anemia    Arthritis    Encounter for care of pacemaker 07/12/2020   GERD (gastroesophageal reflux disease)    Heart block AV complete (HCC) 07/11/2020   Hyperlipidemia    Hypertension    Pacemaker Abbott Assurity MRI model [id number] 07/12/2020 07/12/2020    Medications:  Medications Prior to Admission  Medication Sig Dispense Refill Last Dose/Taking   cholecalciferol  (VITAMIN D ) 1000 UNITS tablet Take 1,000 Units by mouth daily.    11/19/2023   ezetimibe  (ZETIA ) 10 MG tablet Take 10 mg by mouth daily.    11/19/2023   folic acid  (FOLVITE ) 1 MG tablet Take 1 mg by mouth 2 (two) times daily.   11/19/2023   furosemide  (LASIX ) 20 MG tablet TAKE 1 TABLET BY MOUTH EVERY DAY AS NEEDED (Patient taking differently: Take 20 mg by mouth  daily.) 90 tablet 3 11/19/2023   Glycerin , Laxative, (ADULT SUPPOSITORY RE) Place 1 Dose rectally daily as needed (Constipation).   Past Week   guaiFENesin  (MUCINEX ) 600 MG 12 hr tablet Take 600 mg by mouth 2 (two) times daily as needed for cough or to loosen phlegm.   Unknown   isosorbide -hydrALAZINE  (BIDIL ) 20-37.5 MG tablet Take 1 tablet by mouth 3 (three) times daily. 270 tablet 3 11/19/2023   levothyroxine  (SYNTHROID ) 25 MCG tablet Take 25 mcg by mouth daily.    11/19/2023   metoprolol  succinate (TOPROL -XL) 25 MG 24 hr tablet Take 1 tablet (25 mg total) by mouth daily. 90 tablet 3 11/19/2023   Multiple Vitamins-Minerals (PRESERVISION AREDS PO) Take 1 Dose by mouth 2 (two) times daily.   Past Week   Omega-3 Fatty Acids (OMEGA-3 FISH OIL PO) Take 1 capsule by mouth in the morning and at bedtime.   11/19/2023   omeprazole  (PRILOSEC) 40 MG capsule Take 1 capsule (40  mg total) by mouth 2 (two) times daily for 30 days, THEN 1 capsule (40 mg total) daily. 90 capsule 0 11/19/2023   polyethylene glycol (MIRALAX  / GLYCOLAX ) 17 g packet Take 17 g by mouth daily as needed for moderate constipation.   Unknown   predniSONE  (DELTASONE ) 5 MG tablet Take 5 mg by mouth daily.   11/19/2023   Scheduled:   ezetimibe   10 mg Oral Daily   folic acid   1 mg Oral BID   furosemide   40 mg Intravenous BID   isosorbide -hydrALAZINE   1 tablet Oral TID   levothyroxine   25 mcg Oral Q0600   metoprolol  succinate  25 mg Oral Daily   pantoprazole   40 mg Oral BID   potassium chloride   40 mEq Oral BID   predniSONE   5 mg Oral Daily   sodium chloride  flush  10-40 mL Intracatheter Q12H   sodium chloride  flush  3 mL Intravenous Q12H   Infusions:   heparin  550 Units/hr (11/21/23 1008)   PRN: acetaminophen  **OR** acetaminophen , albuterol , guaiFENesin , sodium chloride  flush  Assessment: 89 YO F with PMH significant for recent GIB- EGD showing nonbleeding gastric and duodenal ulcer, HTN, HLD, combined systolic and diastolic CHF, GERD  presented with AMS and choking on AM medications. Pharmacy consulted for Heparin  for PE/DVT/  09/13 AM update: Heparin  still supratherapeutic this AM at 0.81. No s/sx of bleeding noted per RN. No issues with line. CBC stable.  9/13 PM update: HL 0.64 is therapeutic on 550 units/hr. No issues with the infusion or bleeding reported per RN.  Goal of Therapy:  Heparin  level 0.3-0.7 units/ml Monitor platelets by anticoagulation protocol: Yes   Plan:  Continue Heparin  infusion at 550 units/hr Check confirmatory heparin  level in 8hrs and daily while on heparin  Continue to monitor H&H and platelets  Rocky Slade, PharmD, BCPS Please refer to [facility] for [facility] Pharmacy numbers 11/21/2023 5:37 PM

## 2023-11-21 NOTE — Progress Notes (Signed)
 PHARMACY - ANTICOAGULATION CONSULT NOTE  Pharmacy Consult for heparin  Indication: PE/DVT  Labs: Recent Labs    11/20/23 0815 11/20/23 1004 11/20/23 1005 11/20/23 1016 11/20/23 2355  HGB 11.4*  --   --  11.2*  --   HCT 33.5*  --   --  33.0*  --   PLT 381  --   --   --   --   HEPARINUNFRC  --   --   --   --  0.83*  CREATININE  --  0.98  --  1.20*  --   TROPONINIHS 60*  --  51*  --   --    Assessment: 89yo female supratherapeutic on heparin  with initial dosing for PE/DVT; no infusion issues or signs of bleeding per RN.  Goal of Therapy:  Heparin  level 0.3-0.7 units/ml   Plan:  Decrease heparin  infusion by 2-3 units/kg/hr to 700 units/hr. Check level in 8 hours.   Pamela Dunn, PharmD, BCPS 11/21/2023 12:24 AM

## 2023-11-22 DIAGNOSIS — Z7189 Other specified counseling: Secondary | ICD-10-CM | POA: Diagnosis not present

## 2023-11-22 DIAGNOSIS — E871 Hypo-osmolality and hyponatremia: Secondary | ICD-10-CM | POA: Diagnosis not present

## 2023-11-22 DIAGNOSIS — U071 COVID-19: Secondary | ICD-10-CM | POA: Diagnosis not present

## 2023-11-22 DIAGNOSIS — Z515 Encounter for palliative care: Secondary | ICD-10-CM

## 2023-11-22 DIAGNOSIS — G9341 Metabolic encephalopathy: Secondary | ICD-10-CM | POA: Diagnosis not present

## 2023-11-22 DIAGNOSIS — I2693 Single subsegmental pulmonary embolism without acute cor pulmonale: Secondary | ICD-10-CM | POA: Diagnosis not present

## 2023-11-22 DIAGNOSIS — G9349 Other encephalopathy: Secondary | ICD-10-CM | POA: Diagnosis not present

## 2023-11-22 LAB — CBC
HCT: 29.3 % — ABNORMAL LOW (ref 36.0–46.0)
Hemoglobin: 9.7 g/dL — ABNORMAL LOW (ref 12.0–15.0)
MCH: 30 pg (ref 26.0–34.0)
MCHC: 33.1 g/dL (ref 30.0–36.0)
MCV: 90.7 fL (ref 80.0–100.0)
Platelets: 365 K/uL (ref 150–400)
RBC: 3.23 MIL/uL — ABNORMAL LOW (ref 3.87–5.11)
RDW: 15.5 % (ref 11.5–15.5)
WBC: 7.9 K/uL (ref 4.0–10.5)
nRBC: 0 % (ref 0.0–0.2)

## 2023-11-22 LAB — BASIC METABOLIC PANEL WITH GFR
Anion gap: 8 (ref 5–15)
BUN: 16 mg/dL (ref 8–23)
CO2: 29 mmol/L (ref 22–32)
Calcium: 7.7 mg/dL — ABNORMAL LOW (ref 8.9–10.3)
Chloride: 83 mmol/L — ABNORMAL LOW (ref 98–111)
Creatinine, Ser: 0.9 mg/dL (ref 0.44–1.00)
GFR, Estimated: >60 mL/min (ref >60)
Glucose, Bld: 85 mg/dL (ref 70–99)
Potassium: 4 mmol/L (ref 3.5–5.1)
Sodium: 120 mmol/L — ABNORMAL LOW (ref 135–145)

## 2023-11-22 LAB — HEPARIN LEVEL (UNFRACTIONATED): Heparin Unfractionated: 0.43 [IU]/mL (ref 0.30–0.70)

## 2023-11-22 MED ORDER — ALBUMIN HUMAN 25 % IV SOLN
25.0000 g | Freq: Three times a day (TID) | INTRAVENOUS | Status: AC
  Administered 2023-11-22 – 2023-11-23 (×3): 25 g via INTRAVENOUS
  Filled 2023-11-22 (×3): qty 100

## 2023-11-22 MED ORDER — HYDROCORTISONE SOD SUC (PF) 100 MG IJ SOLR: 100.0000 mg | Freq: Once | INTRAMUSCULAR | Status: DC

## 2023-11-22 MED ORDER — MIDODRINE HCL 5 MG PO TABS
10.0000 mg | ORAL_TABLET | Freq: Three times a day (TID) | ORAL | Status: DC
  Administered 2023-11-22 – 2023-11-26 (×14): 10 mg via ORAL
  Filled 2023-11-22 (×14): qty 2

## 2023-11-22 MED ORDER — HYDROCORTISONE SOD SUC (PF) 100 MG IJ SOLR
50.0000 mg | Freq: Two times a day (BID) | INTRAMUSCULAR | Status: AC
  Administered 2023-11-22 – 2023-11-23 (×2): 50 mg via INTRAVENOUS
  Filled 2023-11-22 (×3): qty 1

## 2023-11-22 NOTE — Consult Note (Signed)
 [facility] Nephrology Consultation Note  Requesting MD: Dr. Davia, Ripudeep Reason for consult: Hyponatremia  HPI:  Pamela Dunn is a 89 y.o. female with past medical history significant for hypertension, dyslipidemia, combined systolic and diastolic CHF, complete heart block status post pacemaker, GI bleed who was presented with altered mental status and fluid overload, seen as a consultation for the evaluation of hyponatremia.  The workup showed positive COVID-19 infection and chest x-ray consistent with progressive left pleural effusion.  The patient had CT angiogram done with PE without right heart strain and lung nodule.  Doppler study of breath consistent with acute DVT in right leg.  Treating with IV heparin .  The patient was diagnosed with acute on chronic CHF and was evaluated by cardiologist.  Started on furosemide  IV with uptitration of the dose. We are consulted for the evaluation of hyponatremia.  On admission she has serum sodium level of 121 which has gone up to 123.  Down to 120 today.  Serum creatinine level is stable at 0.90.  Potassium level is normal.  Urine lites showed urine sodium was 76 and urine osmolality 320.  TSH was elevated at 7.17. Today the patient had low blood pressure, systolic BP in the 60s.  Isordil /hydralazine  was discontinued.  Added hydrocortisone , midodrine  and albumin . The patient reported feeling fatigued and tired.  Also complaining of some abdominal discomfort.  She was accompanied by her son and daughter-in-law.  PMHx:   Past Medical History:  Diagnosis Date   Allergy    seasonal   Anemia    Arthritis    Encounter for care of pacemaker 07/12/2020   GERD (gastroesophageal reflux disease)    Heart block AV complete (HCC) 07/11/2020   Hyperlipidemia    Hypertension    Pacemaker Abbott Assurity MRI model [id number] 07/12/2020 07/12/2020    Past Surgical History:  Procedure Laterality Date   APPENDECTOMY     BACK SURGERY     BOWEL  RESECTION N/A 08/24/2019   Procedure: SMALL BOWEL RESECTION;  Surgeon: Vernetta Berg, MD;  Location: [facility]S;  Service: General;  Laterality: N/A;   ESOPHAGOGASTRODUODENOSCOPY N/A 11/08/2023   Procedure: EGD (ESOPHAGOGASTRODUODENOSCOPY);  Surgeon: Rollin Dover, MD;  Location: [facility] ENDOSCOPY;  Service: Gastroenterology;  Laterality: N/A;   LAPAROSCOPY N/A 08/24/2019   Procedure: LAPAROSCOPY DIAGNOSTIC;  Surgeon: Vernetta Berg, MD;  Location: [facility]S;  Service: General;  Laterality: N/A;   LAPAROTOMY N/A 08/24/2019   Procedure: EXPLORATORY LAPAROTOMY;  Surgeon: Vernetta Berg, MD;  Location: [facility];  Service: General;  Laterality: N/A;   PACEMAKER IMPLANT N/A 07/12/2020   Procedure: PACEMAKER IMPLANT;  Surgeon: Kelsie Agent, MD;  Location: [facility] INVASIVE CV LAB;  Service: Cardiovascular;  Laterality: N/A;    Family Hx:  Family History  Problem Relation Age of Onset   Cancer Mother    Cancer - Colon Sister    Heart disease Sister    Brain cancer Brother    Cancer - Colon Brother    Cancer - Colon Brother    Breast cancer Maternal Aunt     Social History:  reports that she has never smoked. She has never used smokeless tobacco. She reports that she does not drink alcohol  and does not use drugs.  Allergies:  Allergies  Allergen Reactions   Coreg  [Carvedilol ] Diarrhea   Lorazepam Diarrhea    Medications: Prior to Admission medications   Medication Sig Start Date End Date Taking? Authorizing Provider  cholecalciferol  (VITAMIN D ) 1000 UNITS tablet Take 1,000 Units by  mouth daily.    Yes [provider]  ezetimibe  (ZETIA ) 10 MG tablet Take 10 mg by mouth daily.    Yes [provider]  folic acid  (FOLVITE ) 1 MG tablet Take 1 mg by mouth 2 (two) times daily. 02/09/23  Yes [provider]  furosemide  (LASIX ) 20 MG tablet TAKE 1 TABLET BY MOUTH EVERY DAY AS NEEDED Patient taking differently: Take 20 mg by mouth daily. 07/14/23  Yes Wyn Jackee VEAR Mickey., NP  Glycerin ,  Laxative, (ADULT SUPPOSITORY RE) Place 1 Dose rectally daily as needed (Constipation).   Yes [provider]  guaiFENesin  (MUCINEX ) 600 MG 12 hr tablet Take 600 mg by mouth 2 (two) times daily as needed for cough or to loosen phlegm.   Yes [provider]  isosorbide -hydrALAZINE  (BIDIL ) 20-37.5 MG tablet Take 1 tablet by mouth 3 (three) times daily. 05/01/23  Yes Ladona Heinz, MD  levothyroxine  (SYNTHROID ) 25 MCG tablet Take 25 mcg by mouth daily.    Yes [provider]  metoprolol  succinate (TOPROL -XL) 25 MG 24 hr tablet Take 1 tablet (25 mg total) by mouth daily. 04/07/23  Yes Ladona Heinz, MD  Multiple Vitamins-Minerals (PRESERVISION AREDS PO) Take 1 Dose by mouth 2 (two) times daily.   Yes [provider]  Omega-3 Fatty Acids (OMEGA-3 FISH OIL PO) Take 1 capsule by mouth in the morning and at bedtime.   Yes [provider]  omeprazole  (PRILOSEC) 40 MG capsule Take 1 capsule (40 mg total) by mouth 2 (two) times daily for 30 days, THEN 1 capsule (40 mg total) daily. 11/09/23 01/08/24 Yes Pahwani, Ravi, MD  polyethylene glycol (MIRALAX  / GLYCOLAX ) 17 g packet Take 17 g by mouth daily as needed for moderate constipation.   Yes [provider]  predniSONE  (DELTASONE ) 5 MG tablet Take 5 mg by mouth daily. 11/03/18  Yes [provider]    I have reviewed the patient's current medications.  Labs: Renal Panel: Recent Labs  Lab 11/20/23 2355 11/21/23 0511 11/21/23 1042 11/21/23 1700 11/22/23 1203  NA 122* 123* 120* 122* 120*  K 4.7 3.8 4.1 3.8 4.0  CL 85* 83* 83* 83* 83*  CO2 22 27 23 26 29   GLUCOSE 103* 84 128* 136* 85  BUN 20 19 19 18 16   CREATININE 0.84 0.88 0.91 0.91 0.90  CALCIUM  8.0* 7.8* 7.6* 7.7* 7.7*     CBC:    Latest Ref Rng & Units 11/22/2023   12:03 PM 11/21/2023    5:11 AM 11/20/2023   10:16 AM  CBC  WBC 4.0 - 10.5 K/uL 7.9  7.8    Hemoglobin 12.0 - 15.0 g/dL 9.7  89.4  88.7   Hematocrit 36.0 - 46.0 % 29.3  30.1   33.0   Platelets 150 - 400 K/uL 365  378       Anemia Panel:  Recent Labs    11/09/23 0943 11/20/23 0815 11/20/23 1016 11/21/23 0511 11/22/23 1203  HGB 11.1* 11.4* 11.2* 10.5* 9.7*  MCV 93.0 91.0  --  88.3 90.7    Recent Labs  Lab 11/20/23 1004 11/21/23 1042  AST 23 30  ALT 13 12  ALKPHOS 58 57  BILITOT 0.6 0.8  PROT 4.7* 4.1*  ALBUMIN  2.1* 1.9*    No results found for: HGBA1C  ROS:  Pertinent items noted in HPI and remainder of comprehensive ROS otherwise negative.  Physical Exam: Vitals:   11/22/23 0454 11/22/23 0809  BP: 106/61 (!) 127/54  Pulse: 63 64  Resp: 16 (!) 21  Temp: (!) 97.5 F (36.4 C) 97.6 F (36.4 C)  SpO2: 97% 98%     General exam: Elderly female lying on bed, not in distress Respiratory system: Reduced breath sound, respiratory effort normal.  Cardiovascular system: S1 & S2 heard, RRR.  Gastrointestinal system: Abdomen is nondistended, soft and nontender.  Central nervous system: Alert, awake and following commands.   Extremities: Bilateral upper and lower extremities pitting edema present.   Skin: Senile skin with many ecchymoses Psychiatry: Alert awake.   Assessment/Plan:  # Hyponatremia, hypervolemic, likely multifactorial etiology mainly due to CHF.  Urine lites presumably after diuretics dose.  Hyponatremia may also be contributed by hypothyroidism, possible adrenal insufficiency given hypotension and may also have SIADH due to underlying lung nodule/respiratory viral infection. - Agree with IV diuretics however concerned about persistent hypotension.  Receiving albumin  ,midodrine , hydrocortisone . -Check a.m. cortisol level. - Continue fluid restriction and monitor lab.  No need for hypertonic saline.  # Hypotension: Start cardiac medication.  Continue albumin , midodrine  and a steroid as above.  Monitor BP.  # Acute on chronic CHF: Seen by cardiologist, continue diuretics to maintain volume.  # Acute DVT and PE: On  heparin .  Thank you for the consult, we will continue to follow with you.   Tamotsu Wiederholt Amelie Romney 11/22/2023, 1:04 PM  [facility].

## 2023-11-22 NOTE — Consult Note (Signed)
 Palliative Medicine Inpatient Consult Note  Consulting Provider:  Davia Nydia POUR, MD   Reason for consult:   Palliative Care Consult Services Palliative Medicine Consult  Reason for Consult? need GOC. 89 yof recently dc'ed, now with confusion, ac DVT, hyponatremia, confusuon, choking on pills, CHF, COVID +, FTT, need GOC,   11/22/2023  HPI:  Per intake H&P -->   89 year old female with HTN, HLP, combined systolic and diastolic CHF, complete heart block s/p pacemaker, GERD, recently discharged on 9/1 (admitted for GI bleed, EGD with nonbleeding gastric and duodenal ulcers), presented with confusion. Identified to be covid positive.  Palliative care has been asked to support additional GOC conversations.   Clinical Assessment/Goals of Care:  *Please note that this is a verbal dictation therefore any spelling or grammatical errors are due to the Dragon Medical One system interpretation.  I have reviewed medical records including EPIC notes, labs and imaging, received report from bedside RN, assessed the patient who is lying in bed though alert and oriented to person, place and some of situation.    I met with Pamela Dunn, her son Pamela Dunn, and her daughter-in-law Pamela Dunn to further discuss diagnosis prognosis, GOC, EOL wishes, disposition and options.   I introduced Palliative Medicine as specialized medical care for people living with serious illness. It focuses on providing relief from the symptoms and stress of a serious illness. The goal is to improve quality of life for both the patient and the family.  Medical History Review and Understanding:  A review of Pamela Dunn's past medical history was completed inclusive of her history of heart failure, complete heart block requiring a pacemaker, GERD, hypertension, hyperlipidemia, rheumatoid arthritis, and seasonal allergies.  Social History:  Pamela Dunn lives in [place], [place] .  She is a widow.  She had 2 sons the 1 has passed away.  She has 2  grandchildren and 2 great-grandchildren.  She formally worked at [employer] and then at [employer].  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  Pamela Dunn shares she gets joy out of working outside in her yard.  She shares she is a woman of faith practicing within the [facility] denomination.  She likes to listen to all variations of gospel music.  Functional and Nutritional State:  Preceding hospitalization Pamela Dunn had 24/7 care via private caregivers in her home, Pamela Dunn, [place], and [place].  They would help with all B ADLs inclusive of bathing, dressing, meal preparation, hoping to mobilize to the bathroom.  Pamela Dunn is able to feed herself and does have a fair appetite.  Advance Directives:  A detailed discussion was had today regarding advanced directives.  [name] shares that she does have healthcare power of attorney documents naming her son, Pamela Dunn is her Runner, broadcasting/film/video.  Code Status:  Concepts specific to code status, artifical feeding and hydration, continued IV antibiotics and rehospitalization was had.  The difference between a aggressive medical intervention path  and a palliative comfort care path for this patient at this time was had.   Encouraged patient/family to consider DNR/DNI status understanding evidenced based poor outcomes in similar hospitalized patient, as the cause of arrest is likely associated with advanced chronic/terminal illness rather than an easily reversible acute cardio-pulmonary event. I explained that DNR/DNI does not change the medical plan and it only comes into effect after a person has arrested (died).  It is a protective measure to keep us  from harming the patient in their last moments of life.   We  discussed in great detail the possible ramifications if we were to pursue a cardiopulmonary resuscitation on [facility] in the setting of her already arthritic bones and pain as a result of this.  I was open and sharing her  outcomes would like sleep be far worse than the reality she currently has.  I shared there is a high likelihood she will require an even more significant degree of assistance than she already does now if she were to even survive this event which I worry she would likely not.  Provided  [employer] booklet.   Discussion:  We reviewed since October of last year a generalized decline has started to occur in [facility] overall health.  We reviewed that she was walking roughly 6 months ago and now is only able to be moved from the chair to the recliner and/or bathroom.  Her care needs have increased significantly.  She does note this.  Roughly 3 weeks ago Pamela Dunn was hospitalized in the setting of what she described was a bleeding ulcer.    We discussed patient's current hospitalization and the events leading up to this inclusive of the fact that she was quite altered and unable to recognize her son. In addition she shares that she had been taking medicine and choked causing caregivers to perform the Heimlich maneuver enabling her to spit up the medication.  We discussed patient's active medical issues inclusive of COVID-19, hyponatremia, heart failure, and DVT.  We reviewed patient's mental state does seem to be gradually improving as emphasized by her son and daughter-in-law at bedside.  We reviewed the chronic disease trajectory in patients who have multiple co-morbidities. I shared that often an event will occur leading to an  acute hospitalizationsuch as a fall, UTI, PNA, heart failure exacerbation, copd exacerbation, or another illness sort. We discussed that patients may have been functioning at a high plateau initially, then an acute event occurs. We discussed that after this event their function, mental, and nutritional states are compromised. Often with treatment and rehabilitation there is some regain in each individuals health, though often not to their prior baseline level. We  discussed that then another event will occur causing a rehospitalization and a further decline. I shared that often this will become a pattern and each event causes greater burden to the individual, depleting them further or their function, cognition, or nutritional state.   [name] does understand her overall medical state and how she has been deteriorating.  I did gently introduced the idea of outpatient palliative support and if patient should continue to decline hospice.  Kaelynne at this time would like to continue with the present measures of care with the hope of improvement.  She is agreeable if needed to skilled nursing and would like to get back home with her 24/7 caregiver staff if able.  Discussed the importance of continued conversation with family and their  medical providers regarding overall plan of care and treatment options, ensuring decisions are within the context of the patients values and GOCs.  Decision Maker: Pamela Dunn, Pamela Dunn (Son): [phone] [facility] Phone)   SUMMARY OF RECOMMENDATIONS   Full code/full scope of care --> discussed potential outcomes if a cardiopulmonary event were to occur and resuscitative efforts were to be pursued.  Patient and her family plan to discuss.  Reviewed patient's chronic decline over the past 11 months and how this has become more pronounced over the past 6 months.  Reviewed the chronic disease trajectory as a relates to  patient's current state of health.  Maliha herself would like to allow time for outcomes and has the hope of improvement.  June is open to skilled nursing if required.  Ongoing palliative care support  Code Status/Advance Care Planning: FULL CODE  Palliative Prophylaxis:  Aspiration, Bowel Regimen, Delirium Protocol, Frequent Pain Assessment, Oral Care, Palliative Wound Care, and Turn Reposition  Additional Recommendations (Limitations, Scope, Preferences): Continue current care  Psycho-social/Spiritual:  Desire for  further Chaplaincy support: Yes Additional Recommendations: Education on chronic disease burden.   Prognosis: High 59-month mortality risk based upon patient's declining physical mobility and multiple chronic comorbid conditions.  Discharge Planning: Discharge plan to be determined.  Vitals:   11/22/23 0010 11/22/23 0454  BP: (!) 122/59 106/61  Pulse: 68 63  Resp: 18 16  Temp: 98.1 F (36.7 C) (!) 97.5 F (36.4 C)  SpO2: 100% 97%    Intake/Output Summary (Last 24 hours) at 11/22/2023 0716 Last data filed at 11/22/2023 0510 Gross per 24 hour  Intake 480 ml  Output 800 ml  Net -320 ml   Last Weight  Most recent update: 11/22/2023  4:55 AM    Weight  65.7 kg (144 lb 13.5 oz)             LABS: CBC:    Component Value Date/Time   WBC 7.8 11/21/2023 0511   HGB 10.5 (L) 11/21/2023 0511   HCT 30.1 (L) 11/21/2023 0511   PLT 378 11/21/2023 0511   MCV 88.3 11/21/2023 0511   NEUTROABS 7.9 (H) 11/09/2023 0943   LYMPHSABS 1.0 11/09/2023 0943   MONOABS 0.7 11/09/2023 0943   EOSABS 0.2 11/09/2023 0943   BASOSABS 0.1 11/09/2023 0943   Comprehensive Metabolic Panel:    Component Value Date/Time   NA 122 (L) 11/21/2023 1700   NA 130 (L) 06/19/2023 1122   K 3.8 11/21/2023 1700   CL 83 (L) 11/21/2023 1700   CO2 26 11/21/2023 1700   BUN 18 11/21/2023 1700   BUN 18 06/19/2023 1122   CREATININE 0.91 11/21/2023 1700   GLUCOSE 136 (H) 11/21/2023 1700   CALCIUM  7.7 (L) 11/21/2023 1700   AST 30 11/21/2023 1042   ALT 12 11/21/2023 1042   ALKPHOS 57 11/21/2023 1042   BILITOT 0.8 11/21/2023 1042   PROT 4.1 (L) 11/21/2023 1042   ALBUMIN  1.9 (L) 11/21/2023 1042    Gen: Elderly Caucasian female chronically ill in appearance HEENT: moist mucous membranes CV: Regular rate and rhythm  PULM: On room air breathing is even and nonlabored ABD: soft/nontender  EXT: Swelling in all extremities and multiple areas of ecchymosis Neuro: Alert and oriented x3   PPS:   This  conversation/these recommendations were discussed with patient primary care team, Dr. Davia  Total Time: 57 ______________________________________________________ Pamela Dunn Palliative Medicine Team Team Cell Phone: [phone] Please utilize secure chat with additional questions, if there is no response within 30 minutes please call the above phone number  Palliative Medicine Team providers are available by phone from 7am to 7pm daily and can be reached through the team cell phone.  Should this patient require assistance outside of these hours, please call the patient's attending physician.

## 2023-11-22 NOTE — Plan of Care (Signed)
 Midline is not working not drawing any blood.  Patient has an established IV access and getting heparin  drip through that.  However the midline has been placed given patient has significant swelling of the bilateral upper extremity and unable to draw any blood from the IV access.  Currently midline is not working anymore. - Need to decide in the daytime if patient needed PICC line placement for long-term care in case we lose the peripheral IV access.

## 2023-11-22 NOTE — Progress Notes (Signed)
 PT Cancellation Note  Patient Details Name: Pamela Dunn MRN: [id number] DOB: June 02, 1931   Cancelled Treatment:    Reason Eval/Treat Not Completed: Fatigue limiting ability to participate. Spoke with pt's son and daughter in Social worker. They report pt is dependent with all mobility with caregivers basically picking pt up for bed to w/c to recliner transfers. At this point do not feel pt's functional decline is reversible and should concentrate on reducing burden of care for caregivers. Do not feel there are any functional reasonable goals for pt to go to a skilled facility and any placement would be a long term custodial situation. At home would recommend a hoyer lift and hospital bed for caregivers to better be able to assist pt. While here will focus on rolling and sitting balance as able to decr burden of care.    Rodgers ORN Maycok 11/22/2023, 2:18 PM [facility] PT Acute [employer] [phone]

## 2023-11-22 NOTE — Plan of Care (Signed)

## 2023-11-22 NOTE — Progress Notes (Signed)
 This Scientist, physiological tried to draw blood from the midline but both were unsuccessful.  Sundil, MD and IV team made aware. RN called phlebotomist to draw blood from the patient.

## 2023-11-22 NOTE — Progress Notes (Signed)
  Progress Note  Patient Name: Pamela Dunn Date of Encounter: 11/22/2023 [facility] Cardiologist: Gordy Bergamo, MD   Interval Summary   Feeling fine, family at bedside. No complaints.  Vital Signs Vitals:   11/21/23 2142 11/22/23 0010 11/22/23 0454 11/22/23 0809  BP: (!) 119/47 (!) 122/59 106/61 (!) 127/54  Pulse:  68 63 64  Resp:  18 16 (!) 21  Temp:  98.1 F (36.7 C) (!) 97.5 F (36.4 C) 97.6 F (36.4 C)  TempSrc:  Axillary Oral Oral  SpO2:  100% 97% 98%  Weight:   65.7 kg   Height:        Intake/Output Summary (Last 24 hours) at 11/22/2023 1032 Last data filed at 11/22/2023 0510 Gross per 24 hour  Intake 360 ml  Output 800 ml  Net -440 ml      11/22/2023    4:54 AM 11/20/2023    4:00 PM 11/07/2023    4:06 PM  Last 3 Weights  Weight (lbs) 144 lb 13.5 oz 144 lb 10 oz 134 lb 14.7 oz  Weight (kg) 65.7 kg 65.6 kg 61.2 kg      Telemetry/ECG  Av paced - Personally Reviewed  Physical Exam  GEN: No acute distress.   Neck: No JVD Cardiac: RRR, no murmurs, rubs, or gallops.  Respiratory: Clear to auscultation bilaterally. GI: Soft, nontender, non-distended  MS: diffuse upper and lower extremity edema, improving in LE.  Assessment & Plan  HFrEF NICM Anasarca Hyponatremia - diffusely edematous but responding well to diuresis. Continue lasix  40 mg IV BID, renal function stable and sodium with modest improvement from 120>122. - on bidil , ok to continue 20-37.5 mg TID, cont metop 25 mg daily   CHB s/p dual chamber PPM - functioning well   Acute PE/RLE DVT - AC per primary team, currently on IV heparin    COVID 19 - per primary   HTN - BP stable. Continue HF therapy.      For questions or updates, please contact [facility] Please consult [email or web address] for contact info under         Signed, Myia Bergh A Serafina Topham, MD

## 2023-11-22 NOTE — Progress Notes (Addendum)
 [facility]                                                                              Pamela Dunn, is a 89 y.o. female, DOB - 1931/11/14, FMW:[id number] Admit date - 11/20/2023    Outpatient Primary MD for the patient is Gerome Brunet, DO  LOS - 2  days  Chief Complaint  Patient presents with   Choking       Brief summary   Patient is a 89 year old female with HTN, HLP, combined systolic and diastolic CHF, complete heart block s/p pacemaker, GERD, recently discharged on 9/1 (admitted for GI bleed, EGD with nonbleeding gastric and duodenal ulcers), presented with confusion.  Per family, over the last few days, she was becoming more confused, not recognizing family.  On the morning of admission had caregivers noted that she got choked up on her medications, complained of abdominal pain, her left arm has been swollen and blue, previously has been treated with anticoagulation for DVT.  She also has been on Lasix  over the last couple of days.  No fevers or cough.  Chest x-ray showed progressive left pleural effusion. CTA chest showed small segmental pulmonary embolus in the right lower lobe, without significant signs of right heart strain, moderate volume left pleural effusion, lobulated nodule 1.4x 1.1 cm increased in size since December worrisome for neoplasm.  CT abdomen noted stable ductal dilation in the body of the pancreas with mild bilateral renal atrophy Sodium 121, BNP 441, troponin 60-51 Received Lasix  40 mg IV x 1 in ED and admitted for further workup  Assessment & Plan    Acute metabolic encephalopathy COVID-19 infection - Worsening confusion since leaving the hospital from the previous admission on 9/1.  Positive for COVID, no acute hypoxia, chest x-ray showed pleural effusion, no infiltrates and with acute hyponatremia.   - Continue delirium precautions, airborne precautions.   - Per family at the bedside, mental status is improving  Acute  hyponatremia - Acute, sodium 121, suspect hypervolemic hyponatremia with anasarca, pleural effusions - Serum osmolarity 262, urine osmolarity 320, UNa 76 -Patient has been on IV Lasix , labs pending for today, serial bmet yesterday however did not show any improvement,  - Pending stat BMET, renal consult   Acute on chronic combined systolic and diastolic CHF heart failure Left pleural effusion Severe hypoalbuminemia - 2D echo showed EF of 45 to 50%, diastolic parameters indeterminate, moderate asymmetric LVH of the basal septal segment, normal RV SF, moderate pleural effusion in the left atrial region - Continue IV Lasix , negative balance of 1.9 L  - Upper extremity edema slightly better today but still diffuse edema Addendum - Per RN, BP soft in 70s - Hold all antihypertensives, Lasix , added midodrine  10 mg 3 times daily, Solu-Cortef  50 mg IV every 12 hours x2 doses (on daily prednisone , may have some underlying adrenal insufficiency), - Albumin  1.9, will place on IV albumin    Acute small subsegmental pulmonary embolism Acute RLE DVT with history of DVT -CTA chest-single small subsegmental pulmonary embolism in the right lower lobe with no findings of right heart strain.  Venous Dopplers upper extremity showed no  DVT or SVT  - Venous Dopplers lower extremity + acute DVT in the right common femoral vein and right femoral vein, no DVT in the left lower extremity - Continue IV heparin  drip, discussed with the patient's son, okay with anticoagulation given acute DVT and PE   Essential hypertension -BP currently stable, continue BiDil , Lasix , Toprol -XL   Normocytic anemia Hemoglobin noted to be 11.4 which appears stable from most recent hospitalization. - Continue to monitor H&H   Hypothyroidism TSH 7.726, free T4 1.46 - Continue levothyroxine  25 mcg, outpatient thyroid  levels in 4 to 6 weeks   History of GI bleed secondary to duodenal and gastric ulcer - discharged on 9/1 after having  GI bleeding secondary to duodenal ulcers. - Continue Protonix  40 mg twice daily - Monitor for signs of bleeding.   Rheumatoid arthritis - Continue prednisone    Pulmonary nodule Acute on chronic.  Lobulated nodule measuring 1.4 x 1.1 increased in size since December of 2024 that is concerning for malignancy.  Family note that they would not want this worked up any further.  Generalized debility, anasarca, hypoalbuminemia - With advanced age, comorbidities, frail, palliative medicine consulted for GOC (discussed with patient's son) - SLP evaluation  Estimated body mass index is 28.29 kg/m as calculated from the following:   Height as of this encounter: 5' (1.524 m).   Weight as of this encounter: 65.7 kg.  Code Status: Full code DVT Prophylaxis:  Heparin  drip   Level of Care: Level of care: Telemetry Medical Family Communication: Updated patient's son and daughter-in-law at the bedside Disposition Plan:      Remains inpatient appropriate:      Procedures:  2D echo  Consultants:   Cardiology Palliative medicine Nephrology  Antimicrobials:   Anti-infectives (From admission, onward)    None          Medications  ezetimibe   10 mg Oral Daily   folic acid   1 mg Oral BID   furosemide   40 mg Intravenous BID   isosorbide -hydrALAZINE   1 tablet Oral TID   levothyroxine   25 mcg Oral Q0600   metoprolol  succinate  25 mg Oral Daily   pantoprazole   40 mg Oral BID   potassium chloride   40 mEq Oral BID   predniSONE   5 mg Oral Daily   sodium chloride  flush  10-40 mL Intracatheter Q12H   sodium chloride  flush  3 mL Intravenous Q12H      Subjective:   Pamela Dunn was seen and examined today.  Much more alert and oriented today, son and daughter-in-law at the bedside.  Still has significant diffuse edema, responding to diuresis.  No acute chest pain, fever chills, productive cough.     Objective:   Vitals:   11/21/23 2142 11/22/23 0010 11/22/23 0454 11/22/23 0809  BP:  (!) 119/47 (!) 122/59 106/61 (!) 127/54  Pulse:  68 63 64  Resp:  18 16 (!) 21  Temp:  98.1 F (36.7 C) (!) 97.5 F (36.4 C) 97.6 F (36.4 C)  TempSrc:  Axillary Oral Oral  SpO2:  100% 97% 98%  Weight:   65.7 kg   Height:        Intake/Output Summary (Last 24 hours) at 11/22/2023 1132 Last data filed at 11/22/2023 0510 Gross per 24 hour  Intake 360 ml  Output 800 ml  Net -440 ml     Wt Readings from Last 3 Encounters:  11/22/23 65.7 kg  11/07/23 61.2 kg  04/08/23 59.4 kg   Physical Exam General:  Much more alert and awake today, per family mental status improving.  NAD  Cardiovascular: S1 S2 clear, RRR.  Respiratory: CTAB, no wheezing, rales or rhonchi Gastrointestinal: Soft, nontender, nondistended, NBS Ext: Diffuse upper and lower extremity edema, anasarca Neuro: no new deficits Skin: RLE dressing intact Psych: Pleasant, follows commands     Data Reviewed:  I have personally reviewed following labs    CBC Lab Results  Component Value Date   WBC 7.8 11/21/2023   RBC 3.41 (L) 11/21/2023   HGB 10.5 (L) 11/21/2023   HCT 30.1 (L) 11/21/2023   MCV 88.3 11/21/2023   MCH 30.8 11/21/2023   PLT 378 11/21/2023   MCHC 34.9 11/21/2023   RDW 15.3 11/21/2023   LYMPHSABS 1.0 11/09/2023   MONOABS 0.7 11/09/2023   EOSABS 0.2 11/09/2023   BASOSABS 0.1 11/09/2023     Last metabolic panel Lab Results  Component Value Date   NA 122 (L) 11/21/2023   K 3.8 11/21/2023   CL 83 (L) 11/21/2023   CO2 26 11/21/2023   BUN 18 11/21/2023   CREATININE 0.91 11/21/2023   GLUCOSE 136 (H) 11/21/2023   GFRNONAA 60 (L) 11/21/2023   GFRAA 46 (L) 09/02/2019   CALCIUM  7.7 (L) 11/21/2023   PHOS 3.8 03/04/2022   PROT 4.1 (L) 11/21/2023   ALBUMIN  1.9 (L) 11/21/2023   BILITOT 0.8 11/21/2023   ALKPHOS 57 11/21/2023   AST 30 11/21/2023   ALT 12 11/21/2023   ANIONGAP 13 11/21/2023    CBG (last 3)  No results for input(s): GLUCAP in the last 72 hours.    Coagulation Profile: No  results for input(s): INR, PROTIME in the last 168 hours.   Radiology Studies: I have personally reviewed the imaging studies  US  CHEST (PLEURAL EFFUSION) Result Date: 11/21/2023 CLINICAL DATA:  Limited ultrasound of the chest done prior to possible thoracentesis. EXAM: CHEST ULTRASOUND COMPARISON:  CTA Chest - 11/20/23 FINDINGS: There is only a small pleural effusion present with no window to allow for safe approach for thoracentesis. IMPRESSION: Small pleural effusion. No window to allow for safe approach for thoracentesis. Ultrasound performed by: Sherrilee Bal, PA-C Electronically Signed   By: JONETTA Faes M.D.   On: 11/21/2023 14:59   VAS US  LOWER EXTREMITY VENOUS (DVT) Result Date: 11/21/2023  Lower Venous DVT Study Patient Name:  Pamela Dunn  Date of Exam:   11/20/2023 Medical Rec #: [id number]      Accession #:    [id number] Date of Birth: 08-May-1931      Patient Gender: F Patient Age:   78 years Exam Location[facility]  [facility] Procedure:      VAS US  LOWER EXTREMITY VENOUS (DVT) Referring Phys: MAXIMINO SHARPS --------------------------------------------------------------------------------  Indications: Pulmonary embolism.  Risk Factors: Confirmed PE. Limitations: Poor ultrasound/tissue interface, body habitus and patient pain tolerance, patient movement. Comparison Study: No prior studies. Performing Technologist: Cordella Collet RVT  Examination Guidelines: A complete evaluation includes B-mode imaging, spectral Doppler, color Doppler, and power Doppler as needed of all accessible portions of each vessel. Bilateral testing is considered an integral part of a complete examination. Limited examinations for reoccurring indications may be performed as noted. The reflux portion of the exam is performed with the patient in reverse Trendelenburg.  +---------+---------------+---------+-----------+----------+-------------------+ RIGHT    CompressibilityPhasicitySpontaneityPropertiesThrombus Aging       +---------+---------------+---------+-----------+----------+-------------------+ CFV      Partial        Yes      Yes  Acute               +---------+---------------+---------+-----------+----------+-------------------+ SFJ      Full                                                             +---------+---------------+---------+-----------+----------+-------------------+ FV Prox  Partial        Yes      Yes                  Acute               +---------+---------------+---------+-----------+----------+-------------------+ FV Mid   Partial        No       No                   Acute               +---------+---------------+---------+-----------+----------+-------------------+ FV DistalPartial        No       No                   Acute               +---------+---------------+---------+-----------+----------+-------------------+ POP      Full           No       No                                       +---------+---------------+---------+-----------+----------+-------------------+ PTV                                                   Not well visualized +---------+---------------+---------+-----------+----------+-------------------+ PERO                                                  Not well visualized +---------+---------------+---------+-----------+----------+-------------------+   +---------+---------------+---------+-----------+----------+-------------------+ LEFT     CompressibilityPhasicitySpontaneityPropertiesThrombus Aging      +---------+---------------+---------+-----------+----------+-------------------+ CFV      Full           Yes      Yes                                      +---------+---------------+---------+-----------+----------+-------------------+ SFJ      Full                                                              +---------+---------------+---------+-----------+----------+-------------------+ FV Prox  Full                                                             +---------+---------------+---------+-----------+----------+-------------------+  FV Mid   Full                                                             +---------+---------------+---------+-----------+----------+-------------------+ FV Distal               Yes      Yes                                      +---------+---------------+---------+-----------+----------+-------------------+ PFV      Full                                                             +---------+---------------+---------+-----------+----------+-------------------+ POP      Full           Yes      Yes                                      +---------+---------------+---------+-----------+----------+-------------------+ PTV      Full                                                             +---------+---------------+---------+-----------+----------+-------------------+ PERO                                                  Not well visualized +---------+---------------+---------+-----------+----------+-------------------+     Summary: RIGHT: - Findings consistent with acute deep vein thrombosis involving the right common femoral vein, and right femoral vein.  - No cystic structure found in the popliteal fossa.  LEFT: - There is no evidence of deep vein thrombosis in the lower extremity. However, portions of this examination were limited- see technologist comments above.  - No cystic structure found in the popliteal fossa.  *See table(s) above for measurements and observations. Electronically signed by Fonda Rim on 11/21/2023 at 8:28:47 AM.    Final    ECHOCARDIOGRAM COMPLETE Result Date: 11/20/2023    ECHOCARDIOGRAM REPORT   Patient Name:   Pamela Dunn Date of Exam: 11/20/2023 Medical Rec #:  [id number]     Height:       60.0 in  Accession #:    [id number]    Weight:       144.6 lb Date of Birth:  05-06-1931     BSA:          1.626 m Patient Age:    89 years      BP:           171/80 mmHg Patient Gender: F             HR:  63 bpm. Exam Location:  Inpatient Procedure: 2D Echo, Cardiac Doppler and Color Doppler (Both Spectral and Color            Flow Doppler were utilized during procedure). Indications:    Pulmonary Embolus I26.09  History:        Patient has prior history of Echocardiogram examinations, most                 recent 06/11/2021. CHF; Risk Factors:Dyslipidemia.  Sonographer:    Thea Norlander RCS Referring Phys: [phone] RONDELL A SMITH IMPRESSIONS  1. Left ventricular ejection fraction, by estimation, is 45 to 50%. The left ventricle has mildly decreased function. The left ventricle demonstrates regional wall motion abnormalities. There is moderate asymmetric left ventricular hypertrophy of the basal-septal segment. Left ventricular diastolic parameters are indeterminate. Abnormal (paradoxical) septal motion, consistent with RV pacemaker  2. Right ventricular systolic function is normal. The right ventricular size is normal.  3. Moderate pleural effusion in the left lateral region.  4. The mitral valve is degenerative. Mild mitral valve regurgitation. No evidence of mitral stenosis.  5. Tricuspid valve regurgitation is moderate.  6. The aortic valve is tricuspid. Aortic valve regurgitation is not visualized. No aortic stenosis is present.  7. The inferior vena cava is normal in size with greater than 50% respiratory variability, suggesting right atrial pressure of 3 mmHg. FINDINGS  Left Ventricle: Left ventricular ejection fraction, by estimation, is 45 to 50%. The left ventricle has mildly decreased function. The left ventricle demonstrates regional wall motion abnormalities. The left ventricular internal cavity size was small. There is moderate asymmetric left ventricular hypertrophy of the basal-septal segment.  Abnormal (paradoxical) septal motion, consistent with RV pacemaker. Left ventricular diastolic parameters are indeterminate. Right Ventricle: The right ventricular size is normal. No increase in right ventricular wall thickness. Right ventricular systolic function is normal. Left Atrium: Left atrial size was normal in size. Right Atrium: Right atrial size was normal in size. Pericardium: Trivial pericardial effusion is present. Mitral Valve: The mitral valve is degenerative in appearance. Mild mitral valve regurgitation. No evidence of mitral valve stenosis. MV peak gradient, 4.3 mmHg. The mean mitral valve gradient is 2.0 mmHg. Tricuspid Valve: The tricuspid valve is normal in structure. Tricuspid valve regurgitation is moderate. Aortic Valve: The aortic valve is tricuspid. Aortic valve regurgitation is not visualized. No aortic stenosis is present. Aortic valve peak gradient measures 4.9 mmHg. Pulmonic Valve: The pulmonic valve was not well visualized. Pulmonic valve regurgitation is trivial. Aorta: The aortic root and ascending aorta are structurally normal, with no evidence of dilitation. Venous: The inferior vena cava is normal in size with greater than 50% respiratory variability, suggesting right atrial pressure of 3 mmHg. IAS/Shunts: The interatrial septum was not well visualized. Additional Comments: There is a moderate pleural effusion in the left lateral region.  LEFT VENTRICLE PLAX 2D LVIDd:         3.50 cm   Diastology LVIDs:         2.60 cm   LV e' medial:  4.90 cm/s LV PW:         0.80 cm   LV e' lateral: 4.35 cm/s LV IVS:        1.00 cm LVOT diam:     1.90 cm LV SV:         51 LV SV Index:   31 LVOT Area:     2.84 cm  RIGHT VENTRICLE  IVC RV S prime:     10.10 cm/s  IVC diam: 1.50 cm TAPSE (M-mode): 2.0 cm LEFT ATRIUM           Index        RIGHT ATRIUM           Index LA diam:      2.50 cm 1.54 cm/m   RA Area:     11.90 cm LA Vol (A4C): 23.5 ml 14.45 ml/m  RA Volume:   24.90 ml  15.31  ml/m  AORTIC VALVE AV Area (Vmax): 2.28 cm AV Vmax:        111.00 cm/s AV Peak Grad:   4.9 mmHg LVOT Vmax:      89.10 cm/s LVOT Vmean:     54.500 cm/s LVOT VTI:       0.179 m  AORTA Ao Root diam: 2.90 cm Ao Asc diam:  3.40 cm MITRAL VALVE MV Area VTI:  1.78 cm   SHUNTS MV Peak grad: 4.3 mmHg   Systemic VTI:  0.18 m MV Mean grad: 2.0 mmHg   Systemic Diam: 1.90 cm MV Vmax:      1.04 m/s MV Vmean:     70.8 cm/s Lonni Nanas MD Electronically signed by Lonni Nanas MD Signature Date/Time: 11/20/2023/9:07:39 PM    Final        Nydia Distance M.D. [facility] 11/22/2023, 11:32 AM  Available via Epic secure chat 7am-7pm After 7 pm, please refer to night coverage provider listed on amion.

## 2023-11-22 NOTE — Progress Notes (Addendum)
 PHARMACY - ANTICOAGULATION CONSULT NOTE  Pharmacy Consult for Heparin  Indication: pulmonary embolus and DVT  Allergies  Allergen Reactions   Coreg  [Carvedilol ] Diarrhea   Lorazepam Diarrhea    Patient Measurements: Height: 5' (152.4 cm) Weight: 65.7 kg (144 lb 13.5 oz) IBW/kg (Calculated) : 45.5 HEPARIN  DW (KG): 59.5  Vital Signs: Temp: 97.6 F (36.4 C) (09/14 0809) Temp Source: Oral (09/14 0809) BP: 100/58 (09/14 1330) Pulse Rate: 63 (09/14 1330)  Labs: Recent Labs    11/20/23 0815 11/20/23 1004 11/20/23 1005 11/20/23 1016 11/20/23 2355 11/21/23 0511 11/21/23 0555 11/21/23 1042 11/21/23 1700 11/22/23 1203  HGB 11.4*  --   --  11.2*  --  10.5*  --   --   --  9.7*  HCT 33.5*  --   --  33.0*  --  30.1*  --   --   --  29.3*  PLT 381  --   --   --   --  378  --   --   --  365  HEPARINUNFRC  --   --   --   --    < >  --  0.81*  --  0.64 0.43  CREATININE  --    < >  --  1.20*   < > 0.88  --  0.91 0.91 0.90  TROPONINIHS 60*  --  51*  --   --   --   --   --   --   --    < > = values in this interval not displayed.    Estimated Creatinine Clearance: 35.2 mL/min (by C-G formula based on SCr of 0.9 mg/dL).   Medical History: Past Medical History:  Diagnosis Date   Allergy    seasonal   Anemia    Arthritis    Encounter for care of pacemaker 07/12/2020   GERD (gastroesophageal reflux disease)    Heart block AV complete (HCC) 07/11/2020   Hyperlipidemia    Hypertension    Pacemaker Abbott Assurity MRI model [id number] 07/12/2020 07/12/2020    Medications:  Medications Prior to Admission  Medication Sig Dispense Refill Last Dose/Taking   cholecalciferol  (VITAMIN D ) 1000 UNITS tablet Take 1,000 Units by mouth daily.    11/19/2023   ezetimibe  (ZETIA ) 10 MG tablet Take 10 mg by mouth daily.    11/19/2023   folic acid  (FOLVITE ) 1 MG tablet Take 1 mg by mouth 2 (two) times daily.   11/19/2023   furosemide  (LASIX ) 20 MG tablet TAKE 1 TABLET BY MOUTH EVERY DAY AS NEEDED  (Patient taking differently: Take 20 mg by mouth daily.) 90 tablet 3 11/19/2023   Glycerin , Laxative, (ADULT SUPPOSITORY RE) Place 1 Dose rectally daily as needed (Constipation).   Past Week   guaiFENesin  (MUCINEX ) 600 MG 12 hr tablet Take 600 mg by mouth 2 (two) times daily as needed for cough or to loosen phlegm.   Unknown   isosorbide -hydrALAZINE  (BIDIL ) 20-37.5 MG tablet Take 1 tablet by mouth 3 (three) times daily. 270 tablet 3 11/19/2023   levothyroxine  (SYNTHROID ) 25 MCG tablet Take 25 mcg by mouth daily.    11/19/2023   metoprolol  succinate (TOPROL -XL) 25 MG 24 hr tablet Take 1 tablet (25 mg total) by mouth daily. 90 tablet 3 11/19/2023   Multiple Vitamins-Minerals (PRESERVISION AREDS PO) Take 1 Dose by mouth 2 (two) times daily.   Past Week   Omega-3 Fatty Acids (OMEGA-3 FISH OIL PO) Take 1 capsule by mouth in the morning and at bedtime.  11/19/2023   omeprazole  (PRILOSEC) 40 MG capsule Take 1 capsule (40 mg total) by mouth 2 (two) times daily for 30 days, THEN 1 capsule (40 mg total) daily. 90 capsule 0 11/19/2023   polyethylene glycol (MIRALAX  / GLYCOLAX ) 17 g packet Take 17 g by mouth daily as needed for moderate constipation.   Unknown   predniSONE  (DELTASONE ) 5 MG tablet Take 5 mg by mouth daily.   11/19/2023   Scheduled:   ezetimibe   10 mg Oral Daily   folic acid   1 mg Oral BID   furosemide   40 mg Intravenous BID   hydrocortisone  sod succinate (SOLU-CORTEF ) inj  50 mg Intravenous Q12H   levothyroxine   25 mcg Oral Q0600   metoprolol  succinate  25 mg Oral Daily   midodrine   10 mg Oral TID WC   pantoprazole   40 mg Oral BID   potassium chloride   40 mEq Oral BID   predniSONE   5 mg Oral Daily   sodium chloride  flush  10-40 mL Intracatheter Q12H   sodium chloride  flush  3 mL Intravenous Q12H   Infusions:   albumin  human 25 g (11/22/23 1239)   heparin  550 Units/hr (11/21/23 1008)   PRN: acetaminophen  **OR** acetaminophen , albuterol , guaiFENesin , sodium chloride  flush  Assessment: 89  YO F with PMH significant for recent GIB- EGD showing nonbleeding gastric and duodenal ulcer, HTN, HLD, combined systolic and diastolic CHF, GERD presented with AMS and choking on AM medications. Pharmacy consulted for Heparin  for PE/DVT/  09/14 PM: Delay in AM level given very hard stick, now also +3 edematous, has midline and it flushes but can't pull back, three phlebotomists attempted peripheral stick without success, IVT placing PICC when they can, cannot get MN. RN was able to draw this PM and heparin  level was therapeutic at 0.43 at a rate of 550 units/hr. No s/sx of bleeding noted per RN. CBC stable.  Goal of Therapy:  Heparin  level 0.3-0.7 units/ml Monitor platelets by anticoagulation protocol: Yes   Plan:  Continue Heparin  infusion at 550 units/hr Daily anti-Xa levels  Continue to monitor H&H and platelets  R. Samual Satterfield, PharmD PGY-1 Acute Care Pharmacy Resident [facility] Please refer to [facility] for [facility] Pharmacy numbers 11/22/2023 2:27 PM

## 2023-11-22 NOTE — Plan of Care (Signed)
   Problem: Education: Goal: Knowledge of General Education information will improve Description: Including pain rating scale, medication(s)/side effects and non-pharmacologic comfort measures Outcome: Progressing   Problem: Clinical Measurements: Goal: Will remain free from infection Outcome: Progressing   Problem: Clinical Measurements: Goal: Diagnostic test results will improve Outcome: Progressing

## 2023-11-22 NOTE — Progress Notes (Signed)
 Phlebotomist were unsuccessful to draw blood from the patient for heparin  level. Pharmacist and Sundil, MD made aware.

## 2023-11-23 DIAGNOSIS — I2693 Single subsegmental pulmonary embolism without acute cor pulmonale: Secondary | ICD-10-CM | POA: Diagnosis not present

## 2023-11-23 DIAGNOSIS — Z7189 Other specified counseling: Secondary | ICD-10-CM | POA: Diagnosis not present

## 2023-11-23 DIAGNOSIS — Z66 Do not resuscitate: Secondary | ICD-10-CM | POA: Diagnosis not present

## 2023-11-23 DIAGNOSIS — G9349 Other encephalopathy: Secondary | ICD-10-CM | POA: Diagnosis not present

## 2023-11-23 DIAGNOSIS — G9341 Metabolic encephalopathy: Secondary | ICD-10-CM | POA: Diagnosis not present

## 2023-11-23 DIAGNOSIS — Z515 Encounter for palliative care: Secondary | ICD-10-CM | POA: Diagnosis not present

## 2023-11-23 DIAGNOSIS — U071 COVID-19: Secondary | ICD-10-CM | POA: Diagnosis not present

## 2023-11-23 DIAGNOSIS — E871 Hypo-osmolality and hyponatremia: Secondary | ICD-10-CM | POA: Diagnosis not present

## 2023-11-23 LAB — RENAL FUNCTION PANEL
Albumin: 3.5 g/dL (ref 3.5–5.0)
Anion gap: 14 (ref 5–15)
BUN: 17 mg/dL (ref 8–23)
CO2: 29 mmol/L (ref 22–32)
Calcium: 8.7 mg/dL — ABNORMAL LOW (ref 8.9–10.3)
Chloride: 85 mmol/L — ABNORMAL LOW (ref 98–111)
Creatinine, Ser: 0.94 mg/dL (ref 0.44–1.00)
GFR, Estimated: 58 mL/min — ABNORMAL LOW
Glucose, Bld: 126 mg/dL — ABNORMAL HIGH (ref 70–99)
Phosphorus: 2.6 mg/dL (ref 2.5–4.6)
Potassium: 3.5 mmol/L (ref 3.5–5.1)
Sodium: 128 mmol/L — ABNORMAL LOW (ref 135–145)

## 2023-11-23 LAB — CBC
HCT: 24.2 % — ABNORMAL LOW (ref 36.0–46.0)
Hemoglobin: 8.2 g/dL — ABNORMAL LOW (ref 12.0–15.0)
MCH: 30.6 pg (ref 26.0–34.0)
MCHC: 33.9 g/dL (ref 30.0–36.0)
MCV: 90.3 fL (ref 80.0–100.0)
Platelets: 308 K/uL (ref 150–400)
RBC: 2.68 MIL/uL — ABNORMAL LOW (ref 3.87–5.11)
RDW: 15.6 % — ABNORMAL HIGH (ref 11.5–15.5)
WBC: 5.9 K/uL (ref 4.0–10.5)
nRBC: 0 % (ref 0.0–0.2)

## 2023-11-23 LAB — CULTURE, BLOOD (ROUTINE X 2)

## 2023-11-23 LAB — CORTISOL-AM, BLOOD: Cortisol - AM: 51 ug/dL — ABNORMAL HIGH (ref 6.7–22.6)

## 2023-11-23 LAB — MRSA NEXT GEN BY PCR, NASAL: MRSA by PCR Next Gen: NOT DETECTED

## 2023-11-23 LAB — HEPARIN LEVEL (UNFRACTIONATED): Heparin Unfractionated: 0.34 [IU]/mL (ref 0.30–0.70)

## 2023-11-23 MED ORDER — APIXABAN 5 MG PO TABS: 5.0000 mg | ORAL_TABLET | Freq: Two times a day (BID) | ORAL | Status: DC

## 2023-11-23 MED ORDER — APIXABAN 5 MG PO TABS
10.0000 mg | ORAL_TABLET | Freq: Two times a day (BID) | ORAL | Status: DC
  Administered 2023-11-23 – 2023-11-26 (×7): 10 mg via ORAL
  Filled 2023-11-23 (×7): qty 2

## 2023-11-23 MED ORDER — ONDANSETRON HCL 4 MG/2ML IJ SOLN
4.0000 mg | Freq: Four times a day (QID) | INTRAMUSCULAR | Status: DC | PRN
  Administered 2023-11-23: 4 mg via INTRAVENOUS
  Filled 2023-11-23: qty 2

## 2023-11-23 NOTE — Plan of Care (Signed)
   Problem: Education: Goal: Knowledge of General Education information will improve Description: Including pain rating scale, medication(s)/side effects and non-pharmacologic comfort measures Outcome: Progressing   Problem: Clinical Measurements: Goal: Will remain free from infection Outcome: Progressing   Problem: Clinical Measurements: Goal: Diagnostic test results will improve Outcome: Progressing

## 2023-11-23 NOTE — Evaluation (Signed)
 Occupational Therapy Evaluation Patient Details Name: Pamela Dunn MRN: [id number] DOB: 30-Dec-1931 Today's Date: 11/23/2023   History of Present Illness   89 yo F adm 11/20/23 with AMS, Covid +, PE of RLL, RLE DVT, pulmonary nodule. PMHx: CHF, CHB s/p PPM, CKD, ICM, HLD, RA     Clinical Impressions Pt is from home environment with very supportive family and almost 24/7 caregiver assist. She is currently unreliable historian, but Son Pamela Dunn confirmed PLOF: progressive decline with all function since Oct 2024 per family including arthritis in BUE. Pt with caregivers throughout the day and actively receiving HHPT. She is max A SPT to [facility], caregivers have been performing bathing/dressing and has a standing hair appointment every Friday that caregivers have been coming to - to assist with transfers. Today she participated in grooming tasks while seated EOB, max A for UB dressing, able to maintain seated balance EOB for approx 5 min at CGA before fatiguing and needing min A. Family would like to see her maximize her independence in BUE function to be able to participate more in UB ADL and get functional activity tolerance back up. OT will focus on adaptive utensils, compensatory strategies for this while admitted and afterwards at [facility] level.      If plan is discharge home, recommend the following:   A lot of help with bathing/dressing/bathroom;Assistance with cooking/housework;Direct supervision/assist for medications management;Direct supervision/assist for financial management;Assist for transportation;Help with stairs or ramp for entrance;Assistance with feeding;Two people to help with walking and/or transfers     Functional Status Assessment   Patient has had a recent decline in their functional status and/or demonstrates limited ability to make significant improvements in function in a reasonable and predictable amount of time     Equipment Recommendations    (adaptive equipment to assist  with UB ADL)     Recommendations for Other Services   PT consult     Precautions/Restrictions   Precautions Precautions: Fall Recall of Precautions/Restrictions: Impaired Precaution/Restrictions Comments: incontinent stool, painful arms Restrictions Weight Bearing Restrictions Per Provider Order: No     Mobility Bed Mobility Overal bed mobility: Needs Assistance Bed Mobility: Rolling, Sidelying to Sit, Sit to Sidelying Rolling: Mod assist Sidelying to sit: Mod assist, +2 for physical assistance Supine to sit: Mod assist, +2 for physical assistance     General bed mobility comments: mod assist to roll bil for pericare and pad change. mod +2 assist to pivot to EOB and elevate trunk as well as to return to bed    Transfers Overall transfer level: Needs assistance   Transfers: Sit to/from Stand Sit to Stand: Max assist, +2 physical assistance           General transfer comment: physical assist of pad under sacrum with pt holding onto therapists to stand briefly 3x from EOB. Pt benefits from encouragement and reassurance      Balance Overall balance assessment: Needs assistance Sitting-balance support: Feet supported, No upper extremity supported Sitting balance-Leahy Scale: Fair Sitting balance - Comments: initial posterior bias with progression to midline, CGA   Standing balance support: Bilateral upper extremity supported, During functional activity Standing balance-Leahy Scale: Poor Standing balance comment: UB support, knees blocked                           ADL either performed or assessed with clinical judgement   ADL Overall ADL's : Needs assistance/impaired Eating/Feeding: Minimal assistance;Sitting Eating/Feeding Details (indicate cue type and reason): need  to look into compensatory strategies for BUE to assist more L handed, but using RUE for past year Grooming: Sitting;Set up;Wash/dry face Grooming Details (indicate cue type and reason):  using R hand Upper Body Bathing: Moderate assistance;Bed level   Lower Body Bathing: Total assistance;Sitting/lateral leans   Upper Body Dressing : Sitting;Maximal assistance;Bed level Upper Body Dressing Details (indicate cue type and reason): new gown Lower Body Dressing: Total assistance;Bed level   Toilet Transfer: Maximal assistance;+2 for physical assistance;+2 for safety/equipment Toilet Transfer Details (indicate cue type and reason): simulated through lateral scoot up bed Toileting- Clothing Manipulation and Hygiene: Maximal assistance;+2 for safety/equipment;Bed level Toileting - Clothing Manipulation Details (indicate cue type and reason): incontinent of bowel and bladder     Functional mobility during ADLs: Maximal assistance;+2 for physical assistance;+2 for safety/equipment (2 person HHA) General ADL Comments: Family is very interested in her being able to do more with her L hand again, and participate more in UB ADL     Vision Patient Visual Report: No change from baseline       Perception Perception: Not tested       Praxis Praxis: Not tested       Pertinent Vitals/Pain Pain Assessment Pain Assessment: Faces Faces Pain Scale: Hurts even more Pain Location: LB and arms with movement and transfers Pain Descriptors / Indicators: Aching, Sore, Tender Pain Intervention(s): Monitored during session, Repositioned     Extremity/Trunk Assessment Upper Extremity Assessment Upper Extremity Assessment: Left hand dominant;RUE deficits/detail;LUE deficits/detail RUE Deficits / Details: 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 able to reach face with RUE allowing pt to self feed but pt very weak 2+/5 LUE Deficits / Details: unable to bring hand to face at this time due to weakness. deformity from RA   Lower Extremity Assessment Lower Extremity Assessment: Defer to PT evaluation   Cervical / Trunk Assessment Cervical / Trunk  Assessment: Kyphotic   Communication Communication Communication: No apparent difficulties   Cognition Arousal: Alert Behavior During Therapy: WFL for tasks assessed/performed Cognition: History of cognitive impairments, Cognition impaired     Awareness: Intellectual awareness intact, Online awareness impaired Memory impairment (select all impairments): Short-term memory, Working Biochemist, clinical functioning impairment (select all impairments): Problem solving, Reasoning OT - Cognition Comments: Pt pleasant and conversive - but providing inaccurate PLOF. Son and DIL able to provide accurate PLOF and home set up.                 Following commands: Intact       Cueing  General Comments   Cueing Techniques: Verbal cues;Gestural cues      Exercises     Shoulder Instructions      Home Living Family/patient expects to be discharged to:: Private residence Living Arrangements: Alone Available Help at Discharge: Family;Available 24 hours/day;Personal care attendant Type of Home: House Home Access: Ramped entrance     Home Layout: One level     Bathroom Shower/Tub: Producer, television/film/video: Handicapped height     Home Equipment: Agricultural consultant (2 wheels);Shower seat;Cane - single point;Grab bars - tub/shower;Rollator (4 wheels);Transport chair;Lift chair;Hand held shower head;Grab bars - toilet;Wheelchair - manual   Additional Comments: adjustable bed      Prior Functioning/Environment Prior Level of Function : Needs assist             Mobility Comments: recently receiving mod-total assist for squat pivot transfer to and from transport chair and from transport  chair to other home surfaces ADLs Comments: Pt with caregiver 14 hours/day M-F and 12 hours saturday and sunday. caregivers assist with BADL, home managament, meals, medication management, RUE use for feeding since Oct 2024    OT Problem List: Decreased strength;Decreased activity  tolerance;Impaired balance (sitting and/or standing);Decreased safety awareness;Decreased knowledge of use of DME or AE;Impaired UE functional use;Pain   OT Treatment/Interventions: Self-care/ADL training;Therapeutic exercise;Therapeutic activities;Patient/family education;Balance training;DME and/or AE instruction      OT Goals(Current goals can be found in the care plan section)   Acute Rehab OT Goals Patient Stated Goal: do more self-feeding and UB grooming/bathing participation OT Goal Formulation: With family Time For Goal Achievement: 12/07/23 Potential to Achieve Goals: Fair ADL Goals Pt Will Perform Eating: with supervision;with adaptive utensils;sitting Pt Will Perform Grooming: with set-up;sitting Pt Will Perform Upper Body Bathing: with mod assist;sitting Additional ADL Goal #1: Pt will perform rolling with min A at bed level to decrease burden of care during ADL   OT Frequency:  Min 1X/week    Co-evaluation PT/OT/SLP Co-Evaluation/Treatment: Yes Reason for Co-Treatment: Complexity of the patient's impairments (multi-system involvement);For patient/therapist safety;To address functional/ADL transfers PT goals addressed during session: Mobility/safety with mobility;Balance;Strengthening/ROM OT goals addressed during session: ADL's and self-care;Strengthening/ROM      AM-PAC OT 6 Clicks Daily Activity     Outcome Measure Help from another person eating meals?: A Little Help from another person taking care of personal grooming?: A Little Help from another person toileting, which includes using toliet, bedpan, or urinal?: Total Help from another person bathing (including washing, rinsing, drying)?: A Lot Help from another person to put on and taking off regular upper body clothing?: A Lot Help from another person to put on and taking off regular lower body clothing?: Total 6 Click Score: 12   End of Session Nurse Communication: Mobility status;Precautions  Activity  Tolerance: Patient tolerated treatment well Patient left: in bed;with call bell/phone within reach;with bed alarm set;with family/visitor present  OT Visit Diagnosis: Unsteadiness on feet (R26.81);Muscle weakness (generalized) (M62.81)                Time: 8898-8855 OT Time Calculation (min): 43 min Charges:  OT General Charges $OT Visit: 1 Visit OT Evaluation $OT Eval Moderate Complexity: 1 Mod  Leita DEL OTR/L Acute Rehabilitation Services Office: [phone]   Leita PARAS [facility] 11/23/2023, 2:14 PM

## 2023-11-23 NOTE — Progress Notes (Signed)
 PHARMACY - ANTICOAGULATION CONSULT NOTE  Pharmacy Consult for Heparin  Indication: pulmonary embolus and DVT  Allergies  Allergen Reactions   Coreg  [Carvedilol ] Diarrhea   Lorazepam Diarrhea    Patient Measurements: Height: 5' (152.4 cm) Weight: 62 kg (136 lb 11 oz) IBW/kg (Calculated) : 45.5 HEPARIN  DW (KG): 59.5  Vital Signs: Temp: 97.9 F (36.6 C) (09/15 0725) Temp Source: Oral (09/15 0521) BP: 117/55 (09/15 0725) Pulse Rate: 63 (09/15 0521)  Labs: Recent Labs    11/20/23 1005 11/20/23 1016 11/21/23 0511 11/21/23 0555 11/21/23 1042 11/21/23 1700 11/22/23 1203 11/23/23 0838  HGB  --    < > 10.5*  --   --   --  9.7* 8.2*  HCT  --    < > 30.1*  --   --   --  29.3* 24.2*  PLT  --   --  378  --   --   --  365 308  HEPARINUNFRC  --    < >  --    < >  --  0.64 0.43 0.34  CREATININE  --    < > 0.88  --  0.91 0.91 0.90  --   TROPONINIHS 51*  --   --   --   --   --   --   --    < > = values in this interval not displayed.    Estimated Creatinine Clearance: 34.2 mL/min (by C-G formula based on SCr of 0.9 mg/dL).   Medical History: Past Medical History:  Diagnosis Date   Allergy    seasonal   Anemia    Arthritis    Encounter for care of pacemaker 07/12/2020   GERD (gastroesophageal reflux disease)    Heart block AV complete (HCC) 07/11/2020   Hyperlipidemia    Hypertension    Pacemaker Abbott Assurity MRI model [id number] 07/12/2020 07/12/2020    Medications:  Medications Prior to Admission  Medication Sig Dispense Refill Last Dose/Taking   cholecalciferol  (VITAMIN D ) 1000 UNITS tablet Take 1,000 Units by mouth daily.    11/19/2023   ezetimibe  (ZETIA ) 10 MG tablet Take 10 mg by mouth daily.    11/19/2023   folic acid  (FOLVITE ) 1 MG tablet Take 1 mg by mouth 2 (two) times daily.   11/19/2023   furosemide  (LASIX ) 20 MG tablet TAKE 1 TABLET BY MOUTH EVERY DAY AS NEEDED (Patient taking differently: Take 20 mg by mouth daily.) 90 tablet 3 11/19/2023   Glycerin , Laxative,  (ADULT SUPPOSITORY RE) Place 1 Dose rectally daily as needed (Constipation).   Past Week   guaiFENesin  (MUCINEX ) 600 MG 12 hr tablet Take 600 mg by mouth 2 (two) times daily as needed for cough or to loosen phlegm.   Unknown   isosorbide -hydrALAZINE  (BIDIL ) 20-37.5 MG tablet Take 1 tablet by mouth 3 (three) times daily. 270 tablet 3 11/19/2023   levothyroxine  (SYNTHROID ) 25 MCG tablet Take 25 mcg by mouth daily.    11/19/2023   metoprolol  succinate (TOPROL -XL) 25 MG 24 hr tablet Take 1 tablet (25 mg total) by mouth daily. 90 tablet 3 11/19/2023   Multiple Vitamins-Minerals (PRESERVISION AREDS PO) Take 1 Dose by mouth 2 (two) times daily.   Past Week   Omega-3 Fatty Acids (OMEGA-3 FISH OIL PO) Take 1 capsule by mouth in the morning and at bedtime.   11/19/2023   omeprazole  (PRILOSEC) 40 MG capsule Take 1 capsule (40 mg total) by mouth 2 (two) times daily for 30 days, THEN 1 capsule (40 mg  total) daily. 90 capsule 0 11/19/2023   polyethylene glycol (MIRALAX  / GLYCOLAX ) 17 g packet Take 17 g by mouth daily as needed for moderate constipation.   Unknown   predniSONE  (DELTASONE ) 5 MG tablet Take 5 mg by mouth daily.   11/19/2023   Scheduled:   ezetimibe   10 mg Oral Daily   folic acid   1 mg Oral BID   furosemide   40 mg Intravenous BID   levothyroxine   25 mcg Oral Q0600   metoprolol  succinate  25 mg Oral Daily   midodrine   10 mg Oral TID WC   pantoprazole   40 mg Oral BID   potassium chloride   40 mEq Oral BID   predniSONE   5 mg Oral Daily   sodium chloride  flush  10-40 mL Intracatheter Q12H   sodium chloride  flush  3 mL Intravenous Q12H   Infusions:   heparin  550 Units/hr (11/23/23 0723)   PRN: acetaminophen  **OR** acetaminophen , albuterol , guaiFENesin , sodium chloride  flush  Assessment: 89 YO F with PMH significant for recent GIB- EGD showing nonbleeding gastric and duodenal ulcer, HTN, HLD, combined systolic and diastolic CHF, GERD presented with AMS and choking on AM medications. Pharmacy  consulted for Heparin  for PE/DVT  Heparin  level therapeutic this AM  Goal of Therapy:  Heparin  level 0.3-0.7 units/ml Monitor platelets by anticoagulation protocol: Yes   Plan:  Continue Heparin  infusion at 550 units/hr Daily anti-Xa levels  Continue to monitor H&H and platelets  Thank you. Olam Monte, PharmD 11/23/2023 9:53 AM

## 2023-11-23 NOTE — TOC Initial Note (Signed)
 Transition of Care [facility]) - Initial/Assessment Note    Patient Details  Name: Pamela Dunn MRN: [id number] Date of Birth: [birth year]  Transition of Care [facility]) CM/SW Contact:    Waddell Barnie Rama, RN Phone Number: 11/23/2023, 12:48 PM  Clinical Narrative:                 From home alone, per son at bedside,  states patient gives him permission to speak with this NCM, she has 24/7 care givers around the clock, and she is active with [facility] for HHRN,HHPT, HHOT and they would like to continue with them,  has PCP and insurance on file, states has w/chair, bsc, transfer board, hosp bed,hoyer lift  at home.  Son states she will need ambulance transport at dc she can not walk,  and family is support system, states [facility] [address]  Pta  bed , w/chair , recliner.  Was feeding her self before now.  Expected Discharge Plan: Home w Home Health Services Barriers to Discharge: Continued Medical Work up   Patient Goals and CMS Choice Patient states their goals for this hospitalization and ongoing recovery are:: retrun home   Choice offered to / list presented to : Adult Children [place] ownership interest in [facility].provided to:: Adult Children    Expected Discharge Plan and Services   Discharge Planning Services: CM Consult Post Acute Care Choice: Home Health Living arrangements for the past 2 months: Single Family Home                 DME Arranged: N/A DME Agency: NA       HH Arranged: RN, PT, OT HH Agency: [facility] Date [facility] Agency Contacted: 11/23/23 Time HH Agency Contacted: 1245 Representative spoke with at [facility] Agency: Darleene  Prior Living Arrangements/Services Living arrangements for the past 2 months: Single Family Home Lives with:: Self Patient language and need for interpreter reviewed:: Yes Do you feel safe going back to the place where you live?: Yes      Need for Family Participation in Patient Care: Yes  (Comment) Care giver support system in place?: Yes (comment) Current home services: DME, Home PT, Home OT, Home RN (active with [facility] for RN, PT, OT, she has w/chair, bsc, transfer board, hosp bed,hoyer lift) Criminal Activity/Legal Involvement Pertinent to Current Situation/Hospitalization: No - Comment as needed  Activities of Daily Living   ADL Screening (condition at time of admission) Independently performs ADLs?: No Does the patient have a NEW difficulty with bathing/dressing/toileting/self-feeding that is expected to last >3 days?: Yes (Initiates electronic notice to provider for possible OT consult) Does the patient have a NEW difficulty with getting in/out of bed, walking, or climbing stairs that is expected to last >3 days?: Yes (Initiates electronic notice to provider for possible PT consult) Does the patient have a NEW difficulty with communication that is expected to last >3 days?: Yes (Initiates electronic notice to provider for possible SLP consult) Is the patient deaf or have difficulty hearing?: Yes Does the patient have difficulty seeing, even when wearing glasses/contacts?: Yes Does the patient have difficulty concentrating, remembering, or making decisions?: Yes  Permission Sought/Granted Permission sought to share information with : Family Supports                Emotional Assessment       Orientation: : Oriented to Self, Oriented to Place Alcohol  / Substance Use: Not Applicable Psych Involvement: No (comment)  Admission diagnosis:  Hyponatremia [E87.1] Pleural effusion [J90] Acute metabolic encephalopathy [G93.41] Acute congestive heart failure, unspecified heart failure type (HCC) [I50.9] Single subsegmental pulmonary embolism without acute cor pulmonale (HCC) [I26.93] Patient Active Problem List   Diagnosis Date Noted   Pleural effusion 11/20/2023   Encephalopathy due to COVID-19 virus 11/20/2023   Hyponatremia 11/20/2023   Heart failure with  preserved ejection fraction (HCC) 11/20/2023   Acute pulmonary embolism (HCC) 11/20/2023   DVT (deep venous thrombosis) (HCC) 11/20/2023   Essential hypertension 11/20/2023   Normocytic anemia 11/20/2023   History of GI bleed 11/20/2023   Gastric ulcer 11/09/2023   Duodenal ulcer 11/09/2023   Acute blood loss anemia (ABLA) 11/09/2023   GIB (gastrointestinal bleeding) 11/07/2023   Hypoglycemia 01/07/2023   Incidental pulmonary nodule 01/07/2023   Fracture of upper arm 01/07/2023   Colitis 01/06/2023   Pacemaker Abbott Assurity MRI model [id number] 07/12/2020 07/12/2020   Encounter for care of pacemaker 07/12/2020   Heart block AV complete (HCC) 07/11/2020   Pressure injury of skin 08/27/2019   Hypothyroidism 08/23/2019   Chronic diastolic CHF (congestive heart failure) (HCC) 08/23/2019   SBO (small bowel obstruction) (HCC) 08/17/2019   Pulmonary nodule 06/19/2017   Metatarsalgia of left foot 07/13/2014   Hx of adenomatous colonic polyps 09/05/2013   Spinal stenosis of lumbar region 09/05/2013   Lumbar disc disease 09/05/2013   Hyperlipidemia 10/23/2009   GERD 10/23/2009   Rheumatoid arthritis (HCC) 10/23/2009   ABDOMINAL PAIN, UNSPECIFIED SITE 10/23/2009   ANEMIA, HX OF 10/23/2009   PEPTIC ULCER DISEASE, HX OF 10/23/2009   PCP:  Gerome Brunet, DO Pharmacy:   [id number] [place], [place] [facility] [address] AT [facility] [address] [place] [address] Phone: [phone] Fax: [phone]  [facility] - [place], [place] - [address] [place] [place] [address] Phone: [phone] Fax: [phone]     Social Drivers of Health (SDOH) Social History: SDOH Screenings   Food Insecurity: No Food Insecurity (11/21/2023)  Housing: Low Risk  (11/21/2023)  Transportation Needs: No Transportation Needs (11/21/2023)  Utilities: Not At Risk (11/21/2023)  Social Connections: Unknown (11/21/2023)  Recent Concern:  Social Connections - Socially Isolated (11/21/2023)  Tobacco Use: Low Risk  (11/20/2023)   SDOH Interventions:     Readmission Risk Interventions    11/23/2023   12:29 PM  Readmission Risk Prevention Plan  Transportation Screening Complete  PCP or Specialist Appt within 3-5 Days Complete  HRI or Home Care Consult Complete  Palliative Care Screening Complete  Medication Review (RN Care Manager) Complete

## 2023-11-23 NOTE — Progress Notes (Signed)
   This pt has been referred and accepted as a pt in our Care Connection program. I was able to speak with both pt son [name] and DIL to discuss PC services in the home and they are in agreement to this service at d/c.     This is a home-based Palliative care program that is provided by [facility]. We will follow the pt at home after discharge to assist with any chronic/acute symptom management needs related to their chronic illness. Our provider will see all pt's for Palliative care consults with ongoing nursing case management and SW visits and telehealth support. The pt will also have after-hours nursing support as well.  With this program the pt is eligible for other [facility] services at home with our program in place.   Thank you for this referral, Magdalena Berber RN BSN [facility] [phone]

## 2023-11-23 NOTE — Progress Notes (Signed)
 [facility] Progress Note  Name: Pamela Dunn MRN: [id number] DOB: [birth year]  Chief Complaint:  Altered mental status  Subjective:  She had 1.9 liters UOP over 9/14 charted as well as one unmeasured urine void.  Interim improvement in sodium.  She has been on lasix  40 mg IV BID.  She also got solucortef starting on 9/14.  She is not normally on lasix  or any diuretic at home.  Her son and daughter-in-law are at bedside.  Her family states that she was previously confused and this is much better  Review of systems:  She denies any shortness of breath or chest pain Denies any n/v; feels like she is going to have diarrhea; family states they aren't aware of any diarrhea  ---------------- Background on consult:  Pamela Dunn is a 89 y.o. female with past medical history significant for hypertension, dyslipidemia, combined systolic and diastolic CHF, complete heart block status post pacemaker, GI bleed who was presented with altered mental status and fluid overload, seen as a consultation for the evaluation of hyponatremia.  The workup showed positive COVID-19 infection and chest x-ray consistent with progressive left pleural effusion.  The patient had CT angiogram done with PE without right heart strain and lung nodule.  Doppler study of breath consistent with acute DVT in right leg.  Treating with IV heparin .  The patient was diagnosed with acute on chronic CHF and was evaluated by cardiologist.  Started on furosemide  IV with uptitration of the dose. We are consulted for the evaluation of hyponatremia.  On admission she has serum sodium level of 121 which has gone up to 123.  Down to 120 today.  Serum creatinine level is stable at 0.90.  Potassium level is normal.  Urine lites showed urine sodium was 76 and urine osmolality 320.  TSH was elevated at 7.17. Today the patient had low blood pressure, systolic BP in the 60s.  Isordil /hydralazine  was discontinued.  Added hydrocortisone ,  midodrine  and albumin . The patient reported feeling fatigued and tired.  Also complaining of some abdominal discomfort.  She was accompanied by her son and daughter-in-law.   Intake/Output Summary (Last 24 hours) at 11/23/2023 1224 Last data filed at 11/23/2023 0700 Gross per 24 hour  Intake 793.2 ml  Output 1450 ml  Net -656.8 ml    Vitals:  Vitals:   11/23/23 0549 11/23/23 0725 11/23/23 1039 11/23/23 1123  BP:  (!) 117/55 (!) 103/40 (!) 108/56  Pulse:  70 70   Resp:  14  20  Temp:  97.9 F (36.6 C)    TempSrc:  Oral    SpO2:  95%    Weight: 62 kg     Height:         Physical Exam:  General elderly female in bed in no acute distress HEENT normocephalic atraumatic extraocular movements intact sclera anicteric Neck supple trachea midline Lungs clear to auscultation bilaterally normal work of breathing at rest  Heart S1S2 no rub Abdomen soft nontender nondistended Extremities no pitting edema appreciated; no cyanosis or clubbing Psych normal mood and affect Neuro alert and oriented to person, year, and location  Medications reviewed   Labs:     Latest Ref Rng & Units 11/23/2023    8:38 AM 11/22/2023   12:03 PM 11/21/2023    5:00 PM  BMP  Glucose 70 - 99 mg/dL 873  85  863   BUN 8 - 23 mg/dL 17  16  18    Creatinine 0.44 - 1.00  mg/dL 9.05  9.09  9.08   Sodium 135 - 145 mmol/L 128  120  122   Potassium 3.5 - 5.1 mmol/L 3.5  4.0  3.8   Chloride 98 - 111 mmol/L 85  83  83   CO2 22 - 32 mmol/L 29  29  26    Calcium  8.9 - 10.3 mg/dL 8.7  7.7  7.7      Assessment/Plan:    # Hyponatremia, hypervolemic, likely multifactorial etiology mainly due to CHF.  Urine lytes presumably after diuretics dose.  Hyponatremia may also be contributed by hypothyroidism, possible adrenal insufficiency given hypotension and may also have SIADH due to underlying lung nodule/respiratory viral infection.  AM cortisol elevated - Discontinue IV lasix    - She is receiving albumin  ,midodrine ,  hydrocortisone . - Continue fluid restriction and monitor labs.   - Improving on current regimen     # Hypotension:  - Continue albumin , midodrine  and she is on steroids as above.    # Covid 19  - Per primary team   # Acute on chronic CHF:  - Seen by cardiology - diuretics as above    # Acute DVT and PE: On heparin  per primary team   Disposition - continue inpatient monitoring.  If continues to improve from a sodium standpoint then disposition per primary team    Katheryn JAYSON Saba, MD 11/23/2023 12:52 PM

## 2023-11-23 NOTE — Progress Notes (Signed)
 PHARMACY - ANTICOAGULATION CONSULT NOTE  Pharmacy Consult for Heparin  transition to apixaban  Indication: pulmonary embolus and DVT  Allergies  Allergen Reactions   Coreg  [Carvedilol ] Diarrhea   Lorazepam Diarrhea    Patient Measurements: Height: 5' (152.4 cm) Weight: 62 kg (136 lb 11 oz) IBW/kg (Calculated) : 45.5 HEPARIN  DW (KG): 59.5  Vital Signs: Temp: 97.9 F (36.6 C) (09/15 0725) Temp Source: Oral (09/15 0521) BP: 103/40 (09/15 1039) Pulse Rate: 70 (09/15 1039)  Labs: Recent Labs    11/21/23 0511 11/21/23 0555 11/21/23 1700 11/22/23 1203 11/23/23 0838  HGB 10.5*  --   --  9.7* 8.2*  HCT 30.1*  --   --  29.3* 24.2*  PLT 378  --   --  365 308  HEPARINUNFRC  --    < > 0.64 0.43 0.34  CREATININE 0.88   < > 0.91 0.90 0.94   < > = values in this interval not displayed.    Estimated Creatinine Clearance: 32.7 mL/min (by C-G formula based on SCr of 0.94 mg/dL).   Medical History: Past Medical History:  Diagnosis Date   Allergy    seasonal   Anemia    Arthritis    Encounter for care of pacemaker 07/12/2020   GERD (gastroesophageal reflux disease)    Heart block AV complete (HCC) 07/11/2020   Hyperlipidemia    Hypertension    Pacemaker Abbott Assurity MRI model [id number] 07/12/2020 07/12/2020    Medications:  Medications Prior to Admission  Medication Sig Dispense Refill Last Dose/Taking   cholecalciferol  (VITAMIN D ) 1000 UNITS tablet Take 1,000 Units by mouth daily.    11/19/2023   ezetimibe  (ZETIA ) 10 MG tablet Take 10 mg by mouth daily.    11/19/2023   folic acid  (FOLVITE ) 1 MG tablet Take 1 mg by mouth 2 (two) times daily.   11/19/2023   furosemide  (LASIX ) 20 MG tablet TAKE 1 TABLET BY MOUTH EVERY DAY AS NEEDED (Patient taking differently: Take 20 mg by mouth daily.) 90 tablet 3 11/19/2023   Glycerin , Laxative, (ADULT SUPPOSITORY RE) Place 1 Dose rectally daily as needed (Constipation).   Past Week   guaiFENesin  (MUCINEX ) 600 MG 12 hr tablet Take 600 mg by  mouth 2 (two) times daily as needed for cough or to loosen phlegm.   Unknown   isosorbide -hydrALAZINE  (BIDIL ) 20-37.5 MG tablet Take 1 tablet by mouth 3 (three) times daily. 270 tablet 3 11/19/2023   levothyroxine  (SYNTHROID ) 25 MCG tablet Take 25 mcg by mouth daily.    11/19/2023   metoprolol  succinate (TOPROL -XL) 25 MG 24 hr tablet Take 1 tablet (25 mg total) by mouth daily. 90 tablet 3 11/19/2023   Multiple Vitamins-Minerals (PRESERVISION AREDS PO) Take 1 Dose by mouth 2 (two) times daily.   Past Week   Omega-3 Fatty Acids (OMEGA-3 FISH OIL PO) Take 1 capsule by mouth in the morning and at bedtime.   11/19/2023   omeprazole  (PRILOSEC) 40 MG capsule Take 1 capsule (40 mg total) by mouth 2 (two) times daily for 30 days, THEN 1 capsule (40 mg total) daily. 90 capsule 0 11/19/2023   polyethylene glycol (MIRALAX  / GLYCOLAX ) 17 g packet Take 17 g by mouth daily as needed for moderate constipation.   Unknown   predniSONE  (DELTASONE ) 5 MG tablet Take 5 mg by mouth daily.   11/19/2023   Scheduled:   apixaban   10 mg Oral BID   Followed by   NOREEN ON 11/30/2023] apixaban   5 mg Oral BID   ezetimibe   10 mg Oral Daily   folic acid   1 mg Oral BID   furosemide   40 mg Intravenous BID   levothyroxine   25 mcg Oral Q0600   metoprolol  succinate  25 mg Oral Daily   midodrine   10 mg Oral TID WC   pantoprazole   40 mg Oral BID   potassium chloride   40 mEq Oral BID   predniSONE   5 mg Oral Daily   sodium chloride  flush  10-40 mL Intracatheter Q12H   sodium chloride  flush  3 mL Intravenous Q12H   Infusions:    PRN: acetaminophen  **OR** acetaminophen , albuterol , guaiFENesin , sodium chloride  flush  Assessment: 89 YO F with PMH significant for recent GIB- EGD showing nonbleeding gastric and duodenal ulcer, HTN, HLD, combined systolic and diastolic CHF, GERD presented with AMS and choking on AM medications. Pharmacy consulted for Heparin  for PE/DVT DC heparin  gtt >> apixaban  consult received  Goal of Therapy:   Monitor platelets by anticoagulation protocol: Yes   Plan:  DC heparin  gtt Start apixaban  10 mg po bid x7 f/b 5 mg po bid at time heparin  gtt is stopped Monitor for signs of bleeding    Pamela Dunn BS, PharmD, BCPS Clinical Pharmacist 11/23/2023 11:58 AM  Contact: [phone] after 3 PM

## 2023-11-23 NOTE — Progress Notes (Signed)
 Heart Failure Navigator Progress Note  Assessed for Heart & Vascular TOC clinic readiness.  Patient does not meet criteria due to per MD note patient will be going home with [facility] . Patient has a scheduled [facility] appointment on 01/05/2024. No HF TOC .   Navigator will sign off at this time.   Stephane Haddock, BSN, Scientist, clinical (histocompatibility and immunogenetics) Only

## 2023-11-23 NOTE — Evaluation (Addendum)
 Physical Therapy Evaluation Patient Details Name: Pamela Dunn MRN: [id number] DOB: December 28, 1931 Today's Date: 11/23/2023  History of Present Illness  89 yo F adm 11/20/23 with AMS, Covid +, PE of RLL, RLE DVT, pulmonary nodule. PMHx: CHF, CHB s/p PPM, CKD, ICM, HLD, RA  Clinical Impression  Pt pleasant and very willing to attempt mobility. Pt stating she is still walking short distances at home but per family that has not occurred for months and has assist for all transfers and uses WC to get to bathroom and to car for hair appointments. Pt with progressive decline with all function since Oct 2024 per family. Pt with caregivers throughout the day and actively receiving HHPT. Pt with limited UB motion due to RA, decreased strength, transfers and mobility. Family goal would be for pt to tolerate standing and be able to use LUE for feeding. Will trial acute therapy to progress mobility and strength to decrease burden of care and maximize quality of life.   HHPT with continued 24hr care recommended.       If plan is discharge home, recommend the following: A lot of help with walking and/or transfers;A lot of help with bathing/dressing/bathroom;Assist for transportation;Assistance with cooking/housework;Direct supervision/assist for medications management   Can travel by private vehicle        Equipment Recommendations Hospital bed;Hoyer lift  Recommendations for Other Services       Functional Status Assessment Patient has had a recent decline in their functional status and/or demonstrates limited ability to make significant improvements in function in a reasonable and predictable amount of time     Precautions / Restrictions Precautions Precautions: Fall Recall of Precautions/Restrictions: Impaired Precaution/Restrictions Comments: incontinent stool, painful arms      Mobility  Bed Mobility Overal bed mobility: Needs Assistance Bed Mobility: Rolling, Sidelying to Sit, Sit to  Sidelying Rolling: Mod assist Sidelying to sit: Mod assist, +2 for physical assistance Supine to sit: Mod assist, +2 for physical assistance     General bed mobility comments: mod assist to roll bil for pericare and pad change. mod +2 assist to pivot to EOB and elevate trunk as well as to return to bed    Transfers Overall transfer level: Needs assistance   Transfers: Sit to/from Stand Sit to Stand: Max assist, +2 physical assistance           General transfer comment: physical assist of pad under sacrum with pt holding onto therapists to stand briefly 3x from EOB. Pt benefits from encouragement and reassurance    Ambulation/Gait               General Gait Details: unable  Stairs            Wheelchair Mobility     Tilt Bed    Modified Rankin (Stroke Patients Only)       Balance Overall balance assessment: Needs assistance Sitting-balance support: Feet supported, No upper extremity supported Sitting balance-Leahy Scale: Fair Sitting balance - Comments: initial posterior bias with progression to midline, CGA   Standing balance support: Bilateral upper extremity supported, During functional activity Standing balance-Leahy Scale: Poor Standing balance comment: UB support, knees blocked                             Pertinent Vitals/Pain Pain Assessment Faces Pain Scale: Hurts even more Pain Location: LB and arms with movement and transfers Pain Descriptors / Indicators: Aching, Sore, Tender Pain Intervention(s): Limited activity within  patient's tolerance, Monitored during session, Repositioned    Home Living Family/patient expects to be discharged to:: Private residence Living Arrangements: Alone Available Help at Discharge: Family;Available 24 hours/day;Personal care attendant Type of Home: House Home Access: Ramped entrance       Home Layout: One level Home Equipment: Agricultural consultant (2 wheels);Shower seat;Cane - single point;Grab  bars - tub/shower;Rollator (4 wheels);Transport chair;Lift chair;Hand held shower head;Grab bars - toilet;Wheelchair - manual Additional Comments: adjustable bed    Prior Function Prior Level of Function : Needs assist             Mobility Comments: recently receiving mod-total assist for squat pivot transfer to and from transport chair and from transport chair to other home surfaces ADLs Comments: Pt with caregiver 14 hours/day M-F and 12 hours saturday and sunday. caregivers assist with BADL, home managament, meals, medication management, RUE use for feeding since Oct 2024     Extremity/Trunk Assessment   Upper Extremity Assessment Upper Extremity Assessment: Defer to OT evaluation    Lower Extremity Assessment Lower Extremity Assessment: Generalized weakness    Cervical / Trunk Assessment Cervical / Trunk Assessment: Kyphotic  Communication   Communication Communication: No apparent difficulties    Cognition Arousal: Alert Behavior During Therapy: WFL for tasks assessed/performed   PT - Cognitive impairments: Memory, Awareness, Orientation, Difficult to assess, Problem solving, Safety/Judgement   Orientation impairments: Time                     Following commands: Intact       Cueing Cueing Techniques: Verbal cues, Gestural cues     General Comments      Exercises General Exercises - Lower Extremity Long Arc Quad: AROM, Both, 10 reps, Seated Hip Flexion/Marching: AROM, Both, 10 reps, Seated   Assessment/Plan    PT Assessment Patient needs continued PT services  PT Problem List Decreased strength;Decreased range of motion;Decreased activity tolerance;Decreased balance;Decreased mobility;Decreased knowledge of use of DME;Decreased cognition;Pain       PT Treatment Interventions Functional mobility training;Therapeutic activities;Therapeutic exercise;Patient/family education;Neuromuscular re-education;DME instruction;Balance training    PT Goals  (Current goals can be found in the Care Plan section)  Acute Rehab PT Goals Patient Stated Goal: walk a little PT Goal Formulation: With patient Time For Goal Achievement: 12/07/23 Potential to Achieve Goals: Fair    Frequency Min 1X/week     Co-evaluation PT/OT/SLP Co-Evaluation/Treatment: Yes Reason for Co-Treatment: Complexity of the patient's impairments (multi-system involvement);For patient/therapist safety;To address functional/ADL transfers PT goals addressed during session: Mobility/safety with mobility;Balance;Strengthening/ROM         AM-PAC PT 6 Clicks Mobility  Outcome Measure Help needed turning from your back to your side while in a flat bed without using bedrails?: A Lot Help needed moving from lying on your back to sitting on the side of a flat bed without using bedrails?: A Lot Help needed moving to and from a bed to a chair (including a wheelchair)?: Total Help needed standing up from a chair using your arms (e.g., wheelchair or bedside chair)?: Total Help needed to walk in hospital room?: Total Help needed climbing 3-5 steps with a railing? : Total 6 Click Score: 8    End of Session   Activity Tolerance: Patient tolerated treatment well Patient left: in bed;with call bell/phone within reach;with family/visitor present Nurse Communication: Mobility status PT Visit Diagnosis: Other abnormalities of gait and mobility (R26.89);Difficulty in walking, not elsewhere classified (R26.2);Muscle weakness (generalized) (M62.81)    Time: 8897-8869 PT Time  Calculation (min) (ACUTE ONLY): 28 min   Charges:   PT Evaluation $PT Eval Moderate Complexity: 1 Mod   PT General Charges $$ ACUTE PT VISIT: 1 Visit         Lenoard SQUIBB, PT Acute Rehabilitation Services Office: [phone]   Berlinda Farve B Anatasia Tino 11/23/2023, 1:19 PM

## 2023-11-23 NOTE — Plan of Care (Signed)

## 2023-11-23 NOTE — Progress Notes (Addendum)
 [facility]                                                                              Pamela Dunn, is a 89 y.o. female, DOB - 11/15/1931, FMW:[id number] Admit date - 11/20/2023    Outpatient Primary MD for the patient is Gerome Brunet, DO  LOS - 3  days  Chief Complaint  Patient presents with   Choking       Brief summary   Patient is a 89 year old female with HTN, HLP, combined systolic and diastolic CHF, complete heart block s/p pacemaker, GERD, recently discharged on 9/1 (admitted for GI bleed, EGD with nonbleeding gastric and duodenal ulcers), presented with confusion.  Per family, over the last few days, she was becoming more confused, not recognizing family.  On the morning of admission had caregivers noted that she got choked up on her medications, complained of abdominal pain, her left arm has been swollen and blue, previously has been treated with anticoagulation for DVT.  She also has been on Lasix  over the last couple of days.  No fevers or cough.  Chest x-ray showed progressive left pleural effusion. CTA chest showed small segmental pulmonary embolus in the right lower lobe, without significant signs of right heart strain, moderate volume left pleural effusion, lobulated nodule 1.4x 1.1 cm increased in size since December worrisome for neoplasm.  CT abdomen noted stable ductal dilation in the body of the pancreas with mild bilateral renal atrophy Sodium 121, BNP 441, troponin 60-51 Received Lasix  40 mg IV x 1 in ED and admitted for further workup  Assessment & Plan    Acute metabolic encephalopathy COVID-19 infection - Worsening confusion since leaving the hospital from the previous admission on 9/1.  Positive for COVID, no acute hypoxia, chest x-ray showed pleural effusion, no infiltrates and with acute hyponatremia.   - Continue delirium precautions, airborne precautions.   - Mental status improving, confounded by son and daughter-in-law at the  bedside  Acute hyponatremia - Acute, sodium 121, suspect hypervolemic hyponatremia with anasarca, pleural effusions - Serum osmolarity 262, urine osmolarity 320, UNa 76 - Sodium improving, 128, nephrology following   Acute on chronic combined systolic and diastolic CHF heart failure Left pleural effusion Severe hypoalbuminemia - 2D echo showed EF of 45 to 50%, diastolic parameters indeterminate, moderate asymmetric LVH of the basal septal segment, normal RV SF, moderate pleural effusion in the left atrial region - Received IV albumin  on 9/14, continue IV Lasix , negative balance of 2.9 L.   - Right arm edema better however still has significant edema in the left arm and hand - Continue midodrine , received 2 doses of stress dose steroids on 9/14    Acute small subsegmental pulmonary embolism Acute RLE DVT with history of DVT -CTA chest-single small subsegmental pulmonary embolism in the right lower lobe with no findings of right heart strain.  Venous Dopplers upper extremity showed no DVT or SVT  - Venous Dopplers lower extremity + acute DVT in the right common femoral vein and right femoral vein, no DVT in the left lower extremity - On IV heparin  drip, transition to DOAC   Essential  hypertension - BP soft, hold BiDil , Toprol -XL, continue IV Lasix  and midodrine     Normocytic anemia - No bleeding, H&H trending downward no bleeding   Hypothyroidism TSH 7.726, free T4 1.46 - Continue levothyroxine  25 mcg, outpatient thyroid  levels in 4 to 6 weeks   History of GI bleed secondary to duodenal and gastric ulcer - discharged on 9/1 after having GI bleeding secondary to duodenal ulcers. - Continue Protonix  40 mg twice daily - Monitor for signs of bleeding.   Rheumatoid arthritis - Continue prednisone    Pulmonary nodule Acute on chronic.  Lobulated nodule measuring 1.4 x 1.1 increased in size since December of 2024 that is concerning for malignancy.  Family note that they would not want  this worked up any further.  Generalized debility, anasarca, hypoalbuminemia - With advanced age, comorbidities, frail, palliative medicine following for GOC - SLP evaluation  Pressure Injury Buttocks Mid;Lower Stage 1  - Wound care per nursing  Estimated body mass index is 26.69 kg/m as calculated from the following:   Height as of this encounter: 5' (1.524 m).   Weight as of this encounter: 62 kg.  Code Status: Full code DVT Prophylaxis:  Heparin  drip   Level of Care: Level of care: Telemetry Medical Family Communication: Updated patient's son and daughter-in-law at the bedside today Disposition Plan:      Remains inpatient appropriate:   Family requesting SNF   Procedures:  2D echo  Consultants:   Cardiology Palliative medicine Nephrology  Antimicrobials:   Anti-infectives (From admission, onward)    None          Medications  ezetimibe   10 mg Oral Daily   folic acid   1 mg Oral BID   furosemide   40 mg Intravenous BID   levothyroxine   25 mcg Oral Q0600   metoprolol  succinate  25 mg Oral Daily   midodrine   10 mg Oral TID WC   pantoprazole   40 mg Oral BID   potassium chloride   40 mEq Oral BID   predniSONE   5 mg Oral Daily   sodium chloride  flush  10-40 mL Intracatheter Q12H   sodium chloride  flush  3 mL Intravenous Q12H      Subjective:   Pamela Dunn was seen and examined today.  Alert, pleasant and responding to commands, family at the bedside, mental status improving still has significant diffuse edema/anasarca, worse in left hand.  No acute chest pain, shortness of breath Objective:   Vitals:   11/23/23 0521 11/23/23 0549 11/23/23 0725 11/23/23 1039  BP: (!) 117/53  (!) 117/55 (!) 103/40  Pulse: 63   70  Resp: 16  14   Temp: 98.3 F (36.8 C)  97.9 F (36.6 C)   TempSrc: Oral     SpO2: 95%     Weight:  62 kg    Height:        Intake/Output Summary (Last 24 hours) at 11/23/2023 1121 Last data filed at 11/23/2023 0700 Gross per 24 hour   Intake 793.2 ml  Output 1450 ml  Net -656.8 ml     Wt Readings from Last 3 Encounters:  11/23/23 62 kg  11/07/23 61.2 kg  04/08/23 59.4 kg   Physical Exam General: Alert and oriented, NAD, pleasant, improving Cardiovascular: S1 S2 clear, RRR.  Respiratory: CTAB, no wheezing, rales or rhonchi Gastrointestinal: Soft, nontender, nondistended, NBS Ext: Diffuse edema, worse in upper extremities, left hand  Neuro: no new deficits Psych: Normal affect      Data Reviewed:  I  have personally reviewed following labs    CBC Lab Results  Component Value Date   WBC 5.9 11/23/2023   RBC 2.68 (L) 11/23/2023   HGB 8.2 (L) 11/23/2023   HCT 24.2 (L) 11/23/2023   MCV 90.3 11/23/2023   MCH 30.6 11/23/2023   PLT 308 11/23/2023   MCHC 33.9 11/23/2023   RDW 15.6 (H) 11/23/2023   LYMPHSABS 1.0 11/09/2023   MONOABS 0.7 11/09/2023   EOSABS 0.2 11/09/2023   BASOSABS 0.1 11/09/2023     Last metabolic panel Lab Results  Component Value Date   NA 128 (L) 11/23/2023   K 3.5 11/23/2023   CL 85 (L) 11/23/2023   CO2 29 11/23/2023   BUN 17 11/23/2023   CREATININE 0.94 11/23/2023   GLUCOSE 126 (H) 11/23/2023   GFRNONAA 58 (L) 11/23/2023   GFRAA 46 (L) 09/02/2019   CALCIUM  8.7 (L) 11/23/2023   PHOS 2.6 11/23/2023   PROT 4.1 (L) 11/21/2023   ALBUMIN  3.5 11/23/2023   BILITOT 0.8 11/21/2023   ALKPHOS 57 11/21/2023   AST 30 11/21/2023   ALT 12 11/21/2023   ANIONGAP 14 11/23/2023    CBG (last 3)  No results for input(s): GLUCAP in the last 72 hours.    Coagulation Profile: No results for input(s): INR, PROTIME in the last 168 hours.   Radiology Studies: I have personally reviewed the imaging studies  US  CHEST (PLEURAL EFFUSION) Result Date: 11/21/2023 CLINICAL DATA:  Limited ultrasound of the chest done prior to possible thoracentesis. EXAM: CHEST ULTRASOUND COMPARISON:  CTA Chest - 11/20/23 FINDINGS: There is only a small pleural effusion present with no window to allow  for safe approach for thoracentesis. IMPRESSION: Small pleural effusion. No window to allow for safe approach for thoracentesis. Ultrasound performed by: Sherrilee Bal, PA-C Electronically Signed   By: JONETTA Faes M.D.   On: 11/21/2023 14:59       Kerriann Kamphuis M.D. [facility] 11/23/2023, 11:21 AM  Available via Epic secure chat 7am-7pm After 7 pm, please refer to night coverage provider listed on amion.

## 2023-11-23 NOTE — Progress Notes (Signed)
 Palliative Medicine Inpatient Follow Up Note HPI: 89 year old female with HTN, HLP, combined systolic and diastolic CHF, complete heart block s/p pacemaker, GERD, recently discharged on 9/1 (admitted for GI bleed, EGD with nonbleeding gastric and duodenal ulcers), presented with confusion. Identified to be covid positive.  Palliative care has been asked to support additional GOC conversations.   Today's Discussion 11/23/2023  *Please note that this is a verbal dictation therefore any spelling or grammatical errors are due to the Dragon Medical One system interpretation.  Chart reviewed inclusive of vital signs, progress notes, laboratory results, and diagnostic images.   I met with [name] at bedside this afternoon. She and I reviewed her current health state. We discussed the concern(s) associated with her present care in the setting of Pamela Dunn's RA which has been a prevailing hurdle in her life with it's flair ups. She shares the hope for improvement.  We discussed cardiopulmonary resuscitation.  I again shared my concerns given Pamela Dunn's rheumatoid arthritis, deconditioning, and generalized frailty.  We reviewed that if we were to pursue this measure we would likely cause her body great harm with little to no benefit.  We discussed the option of a DO NOT RESUSCITATE/DO NOT INTUBATE which would allow us  to continue the present level of medical care and to allow her body to have a natural passing when it is her time.     Pamela Dunn at this time is in agreement with a DO NOT RESUSCITATE DO NOT INTUBATE CODE STATUS.  She agrees if there is something which is treatable she would want to pursue treatment.  She is rather inattentive during our time together, her decision making capacity is hard to assert therefore patient's son and daughter-in-law met with me outside the room.  Pamela Dunn, and I met in the 4E. waiting room.  We discussed outpatient palliative care versus outpatient hospice care.    We discussed  Advanced Directives and a copy was obtained.   We reviewed a MOST form as below:  Cardiopulmonary Resuscitation: Do Not Attempt Resuscitation (DNR/No CPR)  Medical Interventions: Limited Additional Interventions: Use medical treatment, IV fluids and cardiac monitoring as indicated, DO NOT USE intubation or mechanical ventilation. May consider use of less invasive airway support such as BiPAP or CPAP. Also provide comfort measures. Transfer to the hospital if indicated. Avoid intensive care.   Antibiotics: Determine use of limitation of antibiotics when infection occurs  IV Fluids: IV fluids for a defined trial period  Feeding Tube: No feeding tube   We discussed the goals of patient family moving forward which are to have her evaluated by PT and OT and if she can go to a skilled nursing facility pursue that.  They share with me that there have been a lot of issues with outpatient home health at one point they used [facility] and more recently use [facility].  We reviewed durable medical equipment needs when Pamela Dunn does go home.  [name] and daughter-in-law have variety of questions specific to outpatient palliative care we discussed the different services in our area and they have elected to pursue care connections through [facility] [place].  I shared I would request a meeting with the liaison to speak with them regarding their services.  ____________________  Addendum:  Physical therapist, Pamela Dunn and occupational therapist, Pamela Dunn met with patient's son, Pamela and I.  They explained the reasons why they could not recommend skilled nursing facility and shared the interventions that could be pursued to help patient's upper  body functional strength when she discharges. They feel home would be the best environment for her.   Questions and concerns addressed/Palliative Support Provided.   Objective Assessment: Vital Signs Vitals:   11/23/23 1039 11/23/23 1123  BP: (!) 103/40 (!) 108/56   Pulse: 70   Resp:  20  Temp:    SpO2:      Intake/Output Summary (Last 24 hours) at 11/23/2023 1218 Last data filed at 11/23/2023 0700 Gross per 24 hour  Intake 793.2 ml  Output 1450 ml  Net -656.8 ml   Last Weight  Most recent update: 11/23/2023  5:49 AM    Weight  62 kg (136 lb 11 oz)            Gen: Elderly Caucasian female chronically ill in appearance HEENT: moist mucous membranes CV: Regular rate and rhythm  PULM: On room air breathing is even and nonlabored ABD: soft/nontender  EXT: Swelling in all extremities and multiple areas of ecchymosis Neuro: Alert and oriented x3   SUMMARY OF RECOMMENDATIONS   DNAR/DNI  MOST form completed  Advanced Directives obtained  Allowing time for outcomes  OP Palliative support through [facility]  Ongoing PMT involvement as needed ______________________________________________________________________________________ Pamela Dunn [place] Palliative Medicine Team Team Cell Phone: [phone] Please utilize secure chat with additional questions, if there is no response within 30 minutes please call the above phone number  Time Spent: 103  Palliative Medicine Team providers are available by phone from 7am to 7pm daily and can be reached through the team cell phone.  Should this patient require assistance outside of these hours, please call the patient's attending physician.

## 2023-11-23 NOTE — Progress Notes (Signed)
 RN flushed the midline but few amount of blood is drawn. Phlebotomist called to draw blood. Dayshift RN made aware.

## 2023-11-23 NOTE — Progress Notes (Signed)
 Progress Note  Patient Name: Pamela Dunn Date of Encounter: 11/23/2023 Primary Cardiologist: Gordy Bergamo, MD   Subjective   Overnight weights have improved. Patient notes no symptoms.  Sat on the side of the bed this AM  Vital Signs    Vitals:   11/22/23 2309 11/23/23 0521 11/23/23 0549 11/23/23 0725  BP: (!) 96/55 (!) 117/53  (!) 117/55  Pulse: 63 63    Resp: 16 16  14   Temp: 97.7 F (36.5 C) 98.3 F (36.8 C)  97.9 F (36.6 C)  TempSrc: Oral Oral    SpO2: 96% 95%    Weight:   62 kg   Height:        Intake/Output Summary (Last 24 hours) at 11/23/2023 0818 Last data filed at 11/23/2023 0700 Gross per 24 hour  Intake 793.2 ml  Output 1450 ml  Net -656.8 ml   Filed Weights   11/20/23 1600 11/22/23 0454 11/23/23 0549  Weight: 65.6 kg 65.7 kg 62 kg    Physical Exam   GEN: No acute distress.   Neck: No JVD Cardiac: RRR, no murmurs, rubs, or gallops.  Respiratory: Clear to auscultation bilaterally. GI: Soft, nontender, non-distended  MS: No edema, Left arm is hot to touch.  Labs   Telemetry: Paced   Chemistry Recent Labs  Lab 11/20/23 1004 11/20/23 1016 11/21/23 1042 11/21/23 1700 11/22/23 1203  NA 121*   < > 120* 122* 120*  K 3.5   < > 4.1 3.8 4.0  CL 85*   < > 83* 83* 83*  CO2 23   < > 23 26 29   GLUCOSE 73   < > 128* 136* 85  BUN 23   < > 19 18 16   CREATININE 0.98   < > 0.91 0.91 0.90  CALCIUM  8.1*   < > 7.6* 7.7* 7.7*  PROT 4.7*  --  4.1*  --   --   ALBUMIN  2.1*  --  1.9*  --   --   AST 23  --  30  --   --   ALT 13  --  12  --   --   ALKPHOS 58  --  57  --   --   BILITOT 0.6  --  0.8  --   --   GFRNONAA 55*   < > 60* 60* >60  ANIONGAP 13   < > 14 13 8    < > = values in this interval not displayed.     Hematology Recent Labs  Lab 11/20/23 0815 11/20/23 1016 11/21/23 0511 11/22/23 1203  WBC 7.9  --  7.8 7.9  RBC 3.68*  --  3.41* 3.23*  HGB 11.4* 11.2* 10.5* 9.7*  HCT 33.5* 33.0* 30.1* 29.3*  MCV 91.0  --  88.3 90.7  MCH 31.0  --   30.8 30.0  MCHC 34.0  --  34.9 33.1  RDW 15.4  --  15.3 15.5  PLT 381  --  378 365    BNP Recent Labs  Lab 11/20/23 0815 11/21/23 1143  BNP 441.0* 212.0*     Cardiac Studies   Cardiac Studies & Procedures   ______________________________________________________________________________________________   STRESS TESTS  PCV MYOCARDIAL PERFUSION WITH LEXISCAN  09/06/2018  Narrative Lexiscan  Myoview  Stress Test 09/06/2018: Resting EKG demonstrates normal sinus rhythm.  Left bundle branch block.  Stress EKG is non-diagnostic as it's a pharmacologic stress test. Nuclear images reveal left ventricle to be mildly dilated both in rest and stress images with  end-diastolic volume of 138 mL.  The perfusion imaging study demonstrates a moderate sized inferior and inferolateral decreased uptake suggestive of soft tissue attenuation.  In addition there is a very small sized apical septal defect suggestive of ischemia. The left ventricle systolic function calculated by QGS was 24% with global hypokinesis. This represents a high risk study in view of low EF, clinical correlation recommended. Findings may represent non ischemic dilated cardiomyopathy.   ECHOCARDIOGRAM  ECHOCARDIOGRAM COMPLETE 11/20/2023  Narrative ECHOCARDIOGRAM REPORT    Patient Name:   Pamela Dunn Date of Exam: 11/20/2023 Medical Rec #:  [id number]     Height:       60.0 in Accession #:    [id number]    Weight:       144.6 lb Date of Birth:  Jun 11, 1931     BSA:          1.626 m Patient Age:    89 years      BP:           171/80 mmHg Patient Gender: F             HR:           63 bpm. Exam Location:  Inpatient  Procedure: 2D Echo, Cardiac Doppler and Color Doppler (Both Spectral and Color Flow Doppler were utilized during procedure).  Indications:    Pulmonary Embolus I26.09  History:        Patient has prior history of Echocardiogram examinations, most recent 06/11/2021. CHF; Risk Factors:Dyslipidemia.  Sonographer:     Thea Norlander RCS Referring Phys: [phone] RONDELL A SMITH  IMPRESSIONS   1. Left ventricular ejection fraction, by estimation, is 45 to 50%. The left ventricle has mildly decreased function. The left ventricle demonstrates regional wall motion abnormalities. There is moderate asymmetric left ventricular hypertrophy of the basal-septal segment. Left ventricular diastolic parameters are indeterminate. Abnormal (paradoxical) septal motion, consistent with RV pacemaker 2. Right ventricular systolic function is normal. The right ventricular size is normal. 3. Moderate pleural effusion in the left lateral region. 4. The mitral valve is degenerative. Mild mitral valve regurgitation. No evidence of mitral stenosis. 5. Tricuspid valve regurgitation is moderate. 6. The aortic valve is tricuspid. Aortic valve regurgitation is not visualized. No aortic stenosis is present. 7. The inferior vena cava is normal in size with greater than 50% respiratory variability, suggesting right atrial pressure of 3 mmHg.  FINDINGS Left Ventricle: Left ventricular ejection fraction, by estimation, is 45 to 50%. The left ventricle has mildly decreased function. The left ventricle demonstrates regional wall motion abnormalities. The left ventricular internal cavity size was small. There is moderate asymmetric left ventricular hypertrophy of the basal-septal segment. Abnormal (paradoxical) septal motion, consistent with RV pacemaker. Left ventricular diastolic parameters are indeterminate.  Right Ventricle: The right ventricular size is normal. No increase in right ventricular wall thickness. Right ventricular systolic function is normal.  Left Atrium: Left atrial size was normal in size.  Right Atrium: Right atrial size was normal in size.  Pericardium: Trivial pericardial effusion is present.  Mitral Valve: The mitral valve is degenerative in appearance. Mild mitral valve regurgitation. No evidence of mitral valve  stenosis. MV peak gradient, 4.3 mmHg. The mean mitral valve gradient is 2.0 mmHg.  Tricuspid Valve: The tricuspid valve is normal in structure. Tricuspid valve regurgitation is moderate.  Aortic Valve: The aortic valve is tricuspid. Aortic valve regurgitation is not visualized. No aortic stenosis is present. Aortic valve peak gradient measures 4.9 mmHg.  Pulmonic Valve: The pulmonic valve was not well visualized. Pulmonic valve regurgitation is trivial.  Aorta: The aortic root and ascending aorta are structurally normal, with no evidence of dilitation.  Venous: The inferior vena cava is normal in size with greater than 50% respiratory variability, suggesting right atrial pressure of 3 mmHg.  IAS/Shunts: The interatrial septum was not well visualized.  Additional Comments: There is a moderate pleural effusion in the left lateral region.   LEFT VENTRICLE PLAX 2D LVIDd:         3.50 cm   Diastology LVIDs:         2.60 cm   LV e' medial:  4.90 cm/s LV PW:         0.80 cm   LV e' lateral: 4.35 cm/s LV IVS:        1.00 cm LVOT diam:     1.90 cm LV SV:         51 LV SV Index:   31 LVOT Area:     2.84 cm   RIGHT VENTRICLE             IVC RV S prime:     10.10 cm/s  IVC diam: 1.50 cm TAPSE (M-mode): 2.0 cm  LEFT ATRIUM           Index        RIGHT ATRIUM           Index LA diam:      2.50 cm 1.54 cm/m   RA Area:     11.90 cm LA Vol (A4C): 23.5 ml 14.45 ml/m  RA Volume:   24.90 ml  15.31 ml/m AORTIC VALVE AV Area (Vmax): 2.28 cm AV Vmax:        111.00 cm/s AV Peak Grad:   4.9 mmHg LVOT Vmax:      89.10 cm/s LVOT Vmean:     54.500 cm/s LVOT VTI:       0.179 m  AORTA Ao Root diam: 2.90 cm Ao Asc diam:  3.40 cm  MITRAL VALVE MV Area VTI:  1.78 cm   SHUNTS MV Peak grad: 4.3 mmHg   Systemic VTI:  0.18 m MV Mean grad: 2.0 mmHg   Systemic Diam: 1.90 cm MV Vmax:      1.04 m/s MV Vmean:     70.8 cm/s  Lonni Nanas MD Electronically signed by Lonni Nanas  MD Signature Date/Time: 11/20/2023/9:07:39 PM    Final          ______________________________________________________________________________________________          Assessment & Plan   Acute on Chronic HFreF - NYHA I, Hypervolemic - Anasarca with hyponatremia and hypertension - improving with IV diuretics - now off heparin  and on less IV medications - BNP tomorrow potential oral transition tomorrow - long term consider PYP eval for ATTR-CA due to the above  CHD s/p PPM - Pacing no issues  Acute PE/DVT - as per primary  COVID-1- as per primary  PC saw 11/22/23 - no change in therapy  If able to transition to oral therapy tomorrow, will arrage f/u with Dr. Godfrey team  For questions or updates, please contact [facility] Please consult [email or web address] for contact info under Cardiology/STEMI.      Stanly Leavens, MD FASE [facility] Cardiologist [facility]  [address] [place], [address] [place], [place] [address] [phone]  8:18 AM

## 2023-11-24 ENCOUNTER — Telehealth (HOSPITAL_COMMUNITY): Payer: Self-pay | Admitting: Pharmacy Technician

## 2023-11-24 ENCOUNTER — Other Ambulatory Visit (HOSPITAL_COMMUNITY): Payer: Self-pay

## 2023-11-24 DIAGNOSIS — D62 Acute posthemorrhagic anemia: Secondary | ICD-10-CM | POA: Diagnosis not present

## 2023-11-24 DIAGNOSIS — K279 Peptic ulcer, site unspecified, unspecified as acute or chronic, without hemorrhage or perforation: Secondary | ICD-10-CM | POA: Diagnosis not present

## 2023-11-24 DIAGNOSIS — I5042 Chronic combined systolic (congestive) and diastolic (congestive) heart failure: Secondary | ICD-10-CM | POA: Diagnosis not present

## 2023-11-24 DIAGNOSIS — I2693 Single subsegmental pulmonary embolism without acute cor pulmonale: Secondary | ICD-10-CM | POA: Diagnosis not present

## 2023-11-24 DIAGNOSIS — G9349 Other encephalopathy: Secondary | ICD-10-CM | POA: Diagnosis not present

## 2023-11-24 DIAGNOSIS — E871 Hypo-osmolality and hyponatremia: Secondary | ICD-10-CM | POA: Diagnosis not present

## 2023-11-24 DIAGNOSIS — I110 Hypertensive heart disease with heart failure: Secondary | ICD-10-CM | POA: Diagnosis not present

## 2023-11-24 DIAGNOSIS — U071 COVID-19: Secondary | ICD-10-CM | POA: Diagnosis not present

## 2023-11-24 DIAGNOSIS — G9341 Metabolic encephalopathy: Secondary | ICD-10-CM | POA: Diagnosis not present

## 2023-11-24 LAB — RENAL FUNCTION PANEL
Albumin: 3 g/dL — ABNORMAL LOW (ref 3.5–5.0)
Anion gap: 9 (ref 5–15)
BUN: 21 mg/dL (ref 8–23)
CO2: 31 mmol/L (ref 22–32)
Calcium: 8.4 mg/dL — ABNORMAL LOW (ref 8.9–10.3)
Chloride: 87 mmol/L — ABNORMAL LOW (ref 98–111)
Creatinine, Ser: 1.09 mg/dL — ABNORMAL HIGH (ref 0.44–1.00)
GFR, Estimated: 48 mL/min — ABNORMAL LOW (ref >60)
Glucose, Bld: 77 mg/dL (ref 70–99)
Phosphorus: 2.1 mg/dL — ABNORMAL LOW (ref 2.5–4.6)
Potassium: 3.6 mmol/L (ref 3.5–5.1)
Sodium: 127 mmol/L — ABNORMAL LOW (ref 135–145)

## 2023-11-24 LAB — CBC
HCT: 26.5 % — ABNORMAL LOW (ref 36.0–46.0)
Hemoglobin: 8.9 g/dL — ABNORMAL LOW (ref 12.0–15.0)
MCH: 30.6 pg (ref 26.0–34.0)
MCHC: 33.6 g/dL (ref 30.0–36.0)
MCV: 91.1 fL (ref 80.0–100.0)
Platelets: 312 K/uL (ref 150–400)
RBC: 2.91 MIL/uL — ABNORMAL LOW (ref 3.87–5.11)
RDW: 15.7 % — ABNORMAL HIGH (ref 11.5–15.5)
WBC: 8.2 K/uL (ref 4.0–10.5)
nRBC: 0 % (ref 0.0–0.2)

## 2023-11-24 LAB — BRAIN NATRIURETIC PEPTIDE: B Natriuretic Peptide: 2235.5 pg/mL — ABNORMAL HIGH (ref 0.0–100.0)

## 2023-11-24 MED ORDER — ORAL CARE MOUTH RINSE: 15.0000 mL | OROMUCOSAL | Status: DC | PRN

## 2023-11-24 MED ORDER — FUROSEMIDE 40 MG PO TABS
40.0000 mg | ORAL_TABLET | Freq: Every day | ORAL | Status: DC
Start: 2023-11-25 — End: 2023-11-26
  Administered 2023-11-25 – 2023-11-26 (×2): 40 mg via ORAL
  Filled 2023-11-24 (×2): qty 1

## 2023-11-24 MED ORDER — FUROSEMIDE 10 MG/ML IJ SOLN
40.0000 mg | Freq: Once | INTRAMUSCULAR | Status: AC
  Administered 2023-11-24: 40 mg via INTRAVENOUS
  Filled 2023-11-24: qty 4

## 2023-11-24 NOTE — Plan of Care (Signed)
  Problem: Clinical Measurements: Goal: Ability to maintain clinical measurements within normal limits will improve Outcome: Progressing   Problem: Nutrition: Goal: Adequate nutrition will be maintained Outcome: Progressing   Problem: Pain Managment: Goal: General experience of comfort will improve and/or be controlled Outcome: Progressing   Problem: Skin Integrity: Goal: Risk for impaired skin integrity will decrease Outcome: Progressing

## 2023-11-24 NOTE — Progress Notes (Addendum)
   Palliative Medicine Inpatient Follow Up Note HPI: 89 year old female with HTN, HLP, combined systolic and diastolic CHF, complete heart block s/p pacemaker, GERD, recently discharged on 9/1 (admitted for GI bleed, EGD with nonbleeding gastric and duodenal ulcers), presented with confusion. Identified to be covid positive.  Palliative care has been asked to support additional GOC conversations.   Today's Discussion 11/24/2023  *Please note that this is a verbal dictation therefore any spelling or grammatical errors are due to the Dragon Medical One system interpretation.  Chart reviewed inclusive of vital signs, progress notes, laboratory results, and diagnostic images.   I met with Pamela Dunn at bedside this afternoon in the presence of her daughter in law, [name]. Pamela Dunn is alert and oriented. She shares with me that she is feeling alright though has some fatigue and shortness of breath.   I review the plan for her to transition home from the hospital as opposed to skilled nursing. I shared the information provided yesterday by PT/OT. [name] seems at peace with the information provided.   OP Palliative support will be through [facility].   Questions and concerns addressed/Palliative Support Provided.   Objective Assessment: Vital Signs Vitals:   11/24/23 1000 11/24/23 1350  BP: 130/71 (!) 152/66  Pulse:  (!) 59  Resp: 12 (!) 22  Temp:    SpO2:  97%    Intake/Output Summary (Last 24 hours) at 11/24/2023 1531 Last data filed at 11/24/2023 1354 Gross per 24 hour  Intake 243 ml  Output 1250 ml  Net -1007 ml   Last Weight  Most recent update: 11/24/2023  5:31 AM    Weight  61.6 kg (135 lb 12.9 oz)            Gen: Elderly Caucasian female chronically ill in appearance HEENT: moist mucous membranes CV: Regular rate and rhythm  PULM: On room air breathing is even and nonlabored ABD: soft/nontender  EXT: Swelling in all extremities and multiple areas of ecchymosis Neuro: Alert  and oriented x2-3 --> forgetful  SUMMARY OF RECOMMENDATIONS   DNAR/DNI  MOST form completed  Advanced Directives  will be scanned into Vyn[employer]  Allowing time for outcomes  OP Palliative support through [facility]  Ongoing PMT involvement as needed ______________________________________________________________________________________ Rosaline Becton [facility] Palliative Medicine Team Team Cell Phone: [phone] Please utilize secure chat with additional questions, if there is no response within 30 minutes please call the above phone number  Time Spent: 25  Palliative Medicine Team providers are available by phone from 7am to 7pm daily and can be reached through the team cell phone.  Should this patient require assistance outside of these hours, please call the patient's attending physician.

## 2023-11-24 NOTE — Evaluation (Signed)
 Speech Language Pathology Evaluation Patient Details Name: VENISSA NAPPI MRN: [id number] DOB: 03-Feb-1932 Today's Date: 11/24/2023 Time: 8479-8445 SLP Time Calculation (min) (ACUTE ONLY): 34 min  Problem List:  Patient Active Problem List   Diagnosis Date Noted   DNR (do not resuscitate) 11/23/2023   Pleural effusion 11/20/2023   Encephalopathy due to COVID-19 virus 11/20/2023   Hyponatremia 11/20/2023   Heart failure with preserved ejection fraction (HCC) 11/20/2023   Acute pulmonary embolism (HCC) 11/20/2023   DVT (deep venous thrombosis) (HCC) 11/20/2023   Essential hypertension 11/20/2023   Normocytic anemia 11/20/2023   History of GI bleed 11/20/2023   Gastric ulcer 11/09/2023   Duodenal ulcer 11/09/2023   Acute blood loss anemia (ABLA) 11/09/2023   GIB (gastrointestinal bleeding) 11/07/2023   Hypoglycemia 01/07/2023   Incidental pulmonary nodule 01/07/2023   Fracture of upper arm 01/07/2023   Colitis 01/06/2023   Pacemaker Abbott Assurity MRI model [id number] 07/12/2020 07/12/2020   Advanced care planning/counseling discussion 07/12/2020   Heart block AV complete (HCC) 07/11/2020   Pressure injury of skin 08/27/2019   Hypothyroidism 08/23/2019   Chronic diastolic CHF (congestive heart failure) (HCC) 08/23/2019   SBO (small bowel obstruction) (HCC) 08/17/2019   Pulmonary nodule 06/19/2017   Metatarsalgia of left foot 07/13/2014   Hx of adenomatous colonic polyps 09/05/2013   Spinal stenosis of lumbar region 09/05/2013   Lumbar disc disease 09/05/2013   Hyperlipidemia 10/23/2009   GERD 10/23/2009   Rheumatoid arthritis (HCC) 10/23/2009   ABDOMINAL PAIN, UNSPECIFIED SITE 10/23/2009   ANEMIA, HX OF 10/23/2009   PEPTIC ULCER DISEASE, HX OF 10/23/2009   Past Medical History:  Past Medical History:  Diagnosis Date   Allergy    seasonal   Anemia    Arthritis    Encounter for care of pacemaker 07/12/2020   GERD (gastroesophageal reflux disease)    Heart block AV  complete (HCC) 07/11/2020   Hyperlipidemia    Hypertension    Pacemaker Abbott Assurity MRI model [id number] 07/12/2020 07/12/2020   Past Surgical History:  Past Surgical History:  Procedure Laterality Date   APPENDECTOMY     BACK SURGERY     BOWEL RESECTION N/A 08/24/2019   Procedure: SMALL BOWEL RESECTION;  Surgeon: Vernetta Berg, MD;  Location: [facility] ORS;  Service: General;  Laterality: N/A;   ESOPHAGOGASTRODUODENOSCOPY N/A 11/08/2023   Procedure: EGD (ESOPHAGOGASTRODUODENOSCOPY);  Surgeon: Rollin Dover, MD;  Location: [facility] ENDOSCOPY;  Service: Gastroenterology;  Laterality: N/A;   LAPAROSCOPY N/A 08/24/2019   Procedure: LAPAROSCOPY DIAGNOSTIC;  Surgeon: Vernetta Berg, MD;  Location: [facility] ORS;  Service: General;  Laterality: N/A;   LAPAROTOMY N/A 08/24/2019   Procedure: EXPLORATORY LAPAROTOMY;  Surgeon: Vernetta Berg, MD;  Location: [facility] ORS;  Service: General;  Laterality: N/A;   PACEMAKER IMPLANT N/A 07/12/2020   Procedure: PACEMAKER IMPLANT;  Surgeon: Kelsie Agent, MD;  Location: [facility] INVASIVE CV LAB;  Service: Cardiovascular;  Laterality: N/A;   HPI:  Patient is a 89 y.o. female with medical history significant of hypertension, hyperlipidemia, combined systolic and diastolic CHF, complete heart block s/p PPM, GERD who presents due to worsening confusion and recently getting choked up on her pills. History obtained mostly from her son. [name] noted that over the last few days she had become more and more confused. Speech language evaluation ordered to assess patient's cognition.   Assessment / Plan / Recommendation Clinical Impression  Speech language evaluation ordered to evaluate patient's cognitive function. SLP delivered the Neurobehavioral Cognitive Status Examination (COGNISTAT). Patient was alert, awake,  and participated fully in the evaluation. Patient's orientation, naming, and judgement were all within functional limits. Patient's attention was in the mild impairment range.  Patient's score on memory and comprehension placed her in the severe impairment category. Patient's hearing may have been impacted as clinician had to wear a mask covering face. Patient did not demonstrate an awareness to her performance. Based on the reviews of recent notes, suspect patient is at or near baseline for cognition. As patient will have that patient will have 24/7 supervision and support at home, SLP is not recommending skilled intervention at this time.    SLP Assessment  SLP Recommendation/Assessment: Patient does not need any further Speech Language Pathology Services SLP Visit Diagnosis: Cognitive communication deficit (R41.841)     Assistance Recommended at Discharge  Frequent or constant Supervision/Assistance  Functional Status Assessment Patient has had a recent decline in their functional status and demonstrates the ability to make significant improvements in function in a reasonable and predictable amount of time.  Frequency and Duration           SLP Evaluation Cognition  Orientation Level: Oriented to person;Oriented to place;Oriented to time Year: 2025 Month: September Day of Week: Correct Attention: Focused Focused Attention: Appears intact Awareness: Appears intact Problem Solving: Appears intact Safety/Judgment: Appears intact       Comprehension  Auditory Comprehension Overall Auditory Comprehension: Appears within functional limits for tasks assessed Yes/No Questions: Within Functional Limits Commands: Impaired One Step Basic Commands: 75-100% accurate Two Step Basic Commands: 75-100% accurate Conversation: Simple EffectiveTechniques: Increased volume;Slowed speech Visual Recognition/Discrimination Discrimination: Within Function Limits    Expression Expression Primary Mode of Expression: Verbal Verbal Expression Overall Verbal Expression: Appears within functional limits for tasks assessed Initiation: No impairment Repetition:  Impaired Level of Impairment: Word level;Phrase level Naming: Impairment Responsive: 51-75% accurate   Oral / Motor  Oral Motor/Sensory Function Overall Oral Motor/Sensory Function: Within functional limits Motor Speech Overall Motor Speech: Appears within functional limits for tasks assessed Respiration: Within functional limits Phonation: Normal Resonance: Within functional limits Articulation: Within functional limitis Intelligibility: Intelligible Motor Planning: Within functional limits            Damien Hy  Graduate SLP Clinican

## 2023-11-24 NOTE — Telephone Encounter (Signed)
 Patient Product/process development scientist completed.    The patient is insured through [employer]. Patient has Medicare and is not eligible for a copay card, but may be able to apply for patient assistance or Medicare RX Payment Plan (Patient Must reach out to their plan, if eligible for payment plan), if available.    Ran test claim for Eliquis 5 mg and the current 30 day co-pay is $40.00.   This test claim was processed through [facility]- copay amounts may vary at other pharmacies due to pharmacy/plan contracts, or as the patient moves through the different stages of their insurance plan.     Roland Earl, CPHT Pharmacy Technician III Certified Patient Advocate [facility] Patient Advocate Team Direct Number: [phone]  Fax: [phone]

## 2023-11-24 NOTE — Progress Notes (Addendum)
 [facility]                                                                              Pamela Dunn, is a 89 y.o. female, DOB - 01-27-1932, FMW:[id number] Admit date - 11/20/2023    Outpatient Primary MD for the patient is Pamela Brunet, DO  LOS - 4  days  Chief Complaint  Patient presents with   Choking       Brief summary   Patient is a 89 year old female with HTN, HLP, combined systolic and diastolic CHF, complete heart block s/p pacemaker, GERD, recently discharged on 9/1 (admitted for GI bleed, EGD with nonbleeding gastric and duodenal ulcers), presented with confusion.  Per family, over the last few days, she was becoming more confused, not recognizing family.  On the morning of admission had caregivers noted that she got choked up on her medications, complained of abdominal pain, her left arm has been swollen and blue, previously has been treated with anticoagulation for DVT.  She also has been on Lasix  over the last couple of days.  No fevers or cough.  Chest x-ray showed progressive left pleural effusion. CTA chest showed small segmental pulmonary embolus in the right lower lobe, without significant signs of right heart strain, moderate volume left pleural effusion, lobulated nodule 1.4x 1.1 cm increased in size since December worrisome for neoplasm.  CT abdomen noted stable ductal dilation in the body of the pancreas with mild bilateral renal atrophy Sodium 121, BNP 441, troponin 60-51 Received Lasix  40 mg IV x 1 in ED and admitted for further workup  Assessment & Plan    Acute metabolic encephalopathy COVID-19 infection - Worsening confusion since leaving the hospital from the previous admission on 9/1.  Positive for COVID, no acute hypoxia, chest x-ray showed pleural effusion, no infiltrates and with acute hyponatremia.   - Continue delirium precautions, airborne precautions.   - Mental status improving, appears close to her baseline although family  reported that she did not sleep well last night and had some sundowning  Acute hyponatremia - Acute, sodium 121, suspect hypervolemic hyponatremia with anasarca, pleural effusions - Serum osmolarity 262, urine osmolarity 320, UNa 76 - Nephrology following, sodium 127, plan for Lasix  40 mg IV x 1 then continue oral Lasix  starting tomorrow -Continue fluid restriction   Acute on chronic combined systolic and diastolic CHF heart failure Left pleural effusion Severe hypoalbuminemia - 2D echo showed EF of 45 to 50%, diastolic parameters indeterminate, moderate asymmetric LVH of the basal septal segment, normal RV SF, moderate pleural effusion in the left atrial region - Received IV albumin  on 9/14, continue IV Lasix , negative balance of 3.5 L - Anasarca, upper extremities edema significantly improving  Hypotension - Continue midodrine , wean as tolerated - received 2 doses of stress dose steroids on 9/14    Acute small subsegmental pulmonary embolism Acute RLE DVT with history of DVT -CTA chest-single small subsegmental pulmonary embolism in the right lower lobe with no findings of right heart strain.  Venous Dopplers upper extremity showed no DVT or SVT  - Venous Dopplers lower extremity + acute DVT in the right common femoral  vein and right femoral vein, no DVT in the left lower extremity - On IV heparin  drip, transition to DOAC   Essential hypertension - BP soft, hold BiDil , Toprol -XL, continue IV Lasix  and midodrine     Normocytic anemia - No bleeding, H&H stable   Hypothyroidism TSH 7.726, free T4 1.46 - Continue levothyroxine  25 mcg, outpatient thyroid  levels in 4 to 6 weeks   History of GI bleed secondary to duodenal and gastric ulcer - discharged on 9/1 after having GI bleeding secondary to duodenal ulcers. - Continue Protonix  40 mg twice daily - Monitor for signs of bleeding.   Rheumatoid arthritis - Continue prednisone    Pulmonary nodule Acute on chronic.  Lobulated  nodule measuring 1.4 x 1.1 increased in size since December of 2024 that is concerning for malignancy.  Family note that they would not want this worked up any further.  Generalized debility, anasarca, hypoalbuminemia - With advanced age, comorbidities, frail, palliative medicine following for GOC - SLP evaluation, continue dysphagia 1 diet with thin liquids, fluid restriction  Pressure Injury Buttocks Mid;Lower Stage 1 , POA - Wound care per nursing  Estimated body mass index is 26.52 kg/m as calculated from the following:   Height as of this encounter: 5' (1.524 m).   Weight as of this encounter: 61.6 kg.  Code Status: Full code DVT Prophylaxis:  Heparin  drip apixaban  (ELIQUIS ) tablet 10 mg  apixaban  (ELIQUIS ) tablet 5 mg   Level of Care: Level of care: Telemetry Medical Family Communication: Updated patient's son and daughter-in-law at the bedside today Disposition Plan:      Remains inpatient appropriate: Hopefully DC in next 24 to 48 hours   Procedures:  2D echo  Consultants:   Cardiology Palliative medicine Nephrology  Antimicrobials:   Anti-infectives (From admission, onward)    None          Medications  apixaban   10 mg Oral BID   Followed by   NOREEN ON 11/30/2023] apixaban   5 mg Oral BID   ezetimibe   10 mg Oral Daily   folic acid   1 mg Oral BID   [START ON 11/25/2023] furosemide   40 mg Oral Daily   levothyroxine   25 mcg Oral Q0600   metoprolol  succinate  25 mg Oral Daily   midodrine   10 mg Oral TID WC   pantoprazole   40 mg Oral BID   potassium chloride   40 mEq Oral BID   predniSONE   5 mg Oral Daily   sodium chloride  flush  10-40 mL Intracatheter Q12H   sodium chloride  flush  3 mL Intravenous Q12H      Subjective:   [name] was seen and examined today.  Alert, pleasant, significant improvement in the anasarca and arms edema.  Responding to questions, had some sundowning yesterday per family at the bedside.    Objective:   Vitals:    11/23/23 2330 11/24/23 0530 11/24/23 0900 11/24/23 1000  BP: (!) 119/57 (!) 119/56 130/71 130/71  Pulse: (!) 59 65 60   Resp: 15 17 19 12   Temp: 98.7 F (37.1 C) 98.7 F (37.1 C)    TempSrc: Oral Oral    SpO2: 95% 93%    Weight:  61.6 kg    Height:        Intake/Output Summary (Last 24 hours) at 11/24/2023 1350 Last data filed at 11/24/2023 0954 Gross per 24 hour  Intake 123 ml  Output 1050 ml  Net -927 ml     Wt Readings from Last 3 Encounters:  11/24/23 61.6 kg  11/07/23 61.2 kg  04/08/23 59.4 kg   Physical Exam General: Alert and oriented, NAD, pleasant Cardiovascular: S1 S2 clear, RRR.  Respiratory: CTAB, no wheezing Gastrointestinal: Soft, nontender, nondistended, NBS Ext: anasarca, edema in the upper extremities improving Neuro: no new deficits Psych: Normal affect   Data Reviewed:  I have personally reviewed following labs    CBC Lab Results  Component Value Date   WBC 8.2 11/24/2023   RBC 2.91 (L) 11/24/2023   HGB 8.9 (L) 11/24/2023   HCT 26.5 (L) 11/24/2023   MCV 91.1 11/24/2023   MCH 30.6 11/24/2023   PLT 312 11/24/2023   MCHC 33.6 11/24/2023   RDW 15.7 (H) 11/24/2023   LYMPHSABS 1.0 11/09/2023   MONOABS 0.7 11/09/2023   EOSABS 0.2 11/09/2023   BASOSABS 0.1 11/09/2023     Last metabolic panel Lab Results  Component Value Date   NA 127 (L) 11/24/2023   K 3.6 11/24/2023   CL 87 (L) 11/24/2023   CO2 31 11/24/2023   BUN 21 11/24/2023   CREATININE 1.09 (H) 11/24/2023   GLUCOSE 77 11/24/2023   GFRNONAA 48 (L) 11/24/2023   GFRAA 46 (L) 09/02/2019   CALCIUM  8.4 (L) 11/24/2023   PHOS 2.1 (L) 11/24/2023   PROT 4.1 (L) 11/21/2023   ALBUMIN  3.0 (L) 11/24/2023   BILITOT 0.8 11/21/2023   ALKPHOS 57 11/21/2023   AST 30 11/21/2023   ALT 12 11/21/2023   ANIONGAP 9 11/24/2023    CBG (last 3)  No results for input(s): GLUCAP in the last 72 hours.    Coagulation Profile: No results for input(s): INR, PROTIME in the last 168  hours.   Radiology Studies: I have personally reviewed the imaging studies  No results found.      Nydia Distance M.D. [facility] 11/24/2023, 1:50 PM  Available via Epic secure chat 7am-7pm After 7 pm, please refer to night coverage provider listed on amion.

## 2023-11-24 NOTE — Progress Notes (Signed)
 This chaplain responded to PMT NP-Michelle consult for prayer. The Pt. son and daughter in law are at the bedside during the visit.   The chaplain listened reflectively as the Pt. expressed her gratitude for God answering her prayers. The Pt. is grateful for faith, family, and health; all of which are intertwined in her story.  The Pt. accepted the invitation for prayer with the chaplain. This chaplain is available for F/U spiritual care as needed.  Chaplain Leeroy Hummer [phone]

## 2023-11-24 NOTE — Progress Notes (Signed)
 [facility] Progress Note  Name: Pamela Dunn MRN: [id number] DOB: [birth year]  Chief Complaint:  Altered mental status  Subjective:  She had 1.1 liters UOP over 9/15 as well as two unmeasured urine voids.  She is not normally on lasix  or any diuretic at home.  Palliative care was consulted and per charting they will continue to follow her outpatient.  BNP up this AM.  Spoke with her son and daughter in law at bedside.  She's coughing more  Review of systems:    She denies any overt shortness of breath or chest pain but is coughing more  Nausea earlier this AM when she was taking her pills.  No emesis  Mental status improved   ---------------- Background on consult:  Pamela Dunn is a 89 y.o. female with past medical history significant for hypertension, dyslipidemia, combined systolic and diastolic CHF, complete heart block status post pacemaker, GI bleed who was presented with altered mental status and fluid overload, seen as a consultation for the evaluation of hyponatremia.  The workup showed positive COVID-19 infection and chest x-ray consistent with progressive left pleural effusion.  The patient had CT angiogram done with PE without right heart strain and lung nodule.  Doppler study of breath consistent with acute DVT in right leg.  Treating with IV heparin .  The patient was diagnosed with acute on chronic CHF and was evaluated by cardiologist.  Started on furosemide  IV with uptitration of the dose. We are consulted for the evaluation of hyponatremia.  On admission she has serum sodium level of 121 which has gone up to 123.  Down to 120 today.  Serum creatinine level is stable at 0.90.  Potassium level is normal.  Urine lites showed urine sodium was 76 and urine osmolality 320.  TSH was elevated at 7.17. Today the patient had low blood pressure, systolic BP in the 60s.  Isordil /hydralazine  was discontinued.  Added hydrocortisone , midodrine  and albumin . The patient  reported feeling fatigued and tired.  Also complaining of some abdominal discomfort.  She was accompanied by her son and daughter-in-law.   Intake/Output Summary (Last 24 hours) at 11/24/2023 0956 Last data filed at 11/24/2023 0954 Gross per 24 hour  Intake 259.45 ml  Output 1050 ml  Net -790.55 ml    Vitals:  Vitals:   11/23/23 1620 11/23/23 2030 11/23/23 2330 11/24/23 0530  BP: 115/64 (!) 130/59 (!) 119/57 (!) 119/56  Pulse:  68 (!) 59 65  Resp: (!) 21 16 15 17   Temp:  99 F (37.2 C) 98.7 F (37.1 C) 98.7 F (37.1 C)  TempSrc:  Oral Oral Oral  SpO2: 97% 95% 95% 93%  Weight:    61.6 kg  Height:         Physical Exam:   General elderly female in bed in no acute distress HEENT normocephalic atraumatic extraocular movements intact sclera anicteric Neck supple trachea midline Lungs clear but reduced to auscultation bilaterally normal work of breathing at rest on room air   Heart S1S2 no rub Abdomen soft nontender nondistended Extremities no pitting edema appreciated Psych normal mood and affect Neuro alert and oriented to person, year, and location  Medications reviewed   Labs:     Latest Ref Rng & Units 11/24/2023    5:56 AM 11/23/2023    8:38 AM 11/22/2023   12:03 PM  BMP  Glucose 70 - 99 mg/dL 77  873  85   BUN 8 - 23 mg/dL 21  17  16   Creatinine 0.44 - 1.00 mg/dL 8.90  9.05  9.09   Sodium 135 - 145 mmol/L 127  128  120   Potassium 3.5 - 5.1 mmol/L 3.6  3.5  4.0   Chloride 98 - 111 mmol/L 87  85  83   CO2 22 - 32 mmol/L 31  29  29    Calcium  8.9 - 10.3 mg/dL 8.4  8.7  7.7      Assessment/Plan:    # Hyponatremia, hypervolemic, likely multifactorial etiology mainly due to CHF.  Urine lytes presumably after diuretics dose.  Hyponatremia may also be contributed by hypothyroidism, possible adrenal insufficiency given hypotension and may also have SIADH due to underlying lung nodule/respiratory viral infection.  AM cortisol elevated - Lasix  40 mg IV once again today  and have ordered lasix  40 mg PO daily to begin tomorrow - She is receiving midodrine ; previously rec'd hydrocortisone . - fluid restriction and monitor labs.   - Improving on current regimen     # Hypotension:  - Continue midodrine  and she is on steroids    # Covid 19  - Therapies and supportive care per primary team   # Acute on chronic CHF:  - Seen by cardiology - diuretics as above    # Acute DVT and PE: On heparin  per primary team   Disposition - continue inpatient monitoring.  If continues to improve from a sodium standpoint then disposition per primary team     Pamela JAYSON Saba, MD 11/24/2023 10:16 AM

## 2023-11-24 NOTE — Progress Notes (Signed)
 Progress Note  Patient Name: Pamela Dunn Date of Encounter: 11/24/2023 Primary Cardiologist: Gordy Bergamo, MD   Subjective   Overnight feels event better. No CP, SOB, Palpitations.  Vital Signs    Vitals:   11/23/23 2030 11/23/23 2330 11/24/23 0530 11/24/23 0900  BP: (!) 130/59 (!) 119/57 (!) 119/56 130/71  Pulse: 68 (!) 59 65 60  Resp: 16 15 17 19   Temp: 99 F (37.2 C) 98.7 F (37.1 C) 98.7 F (37.1 C)   TempSrc: Oral Oral Oral   SpO2: 95% 95% 93%   Weight:   61.6 kg   Height:        Intake/Output Summary (Last 24 hours) at 11/24/2023 1030 Last data filed at 11/24/2023 0954 Gross per 24 hour  Intake 253.94 ml  Output 1050 ml  Net -796.06 ml   Filed Weights   11/22/23 0454 11/23/23 0549 11/24/23 0530  Weight: 65.7 kg 62 kg 61.6 kg    Physical Exam   GEN: No acute distress.   Neck: No JVD Cardiac: RRR, no murmurs, rubs, or gallops.  Respiratory: Clear to auscultation bilaterally. GI: Soft, nontender, non-distended  MS: left arm is less hot to touch  Labs   Telemetry: AV Pacing   Chemistry Recent Labs  Lab 11/20/23 1004 11/20/23 1016 11/21/23 1042 11/21/23 1700 11/22/23 1203 11/23/23 0838 11/24/23 0556  NA 121*   < > 120*   < > 120* 128* 127*  K 3.5   < > 4.1   < > 4.0 3.5 3.6  CL 85*   < > 83*   < > 83* 85* 87*  CO2 23   < > 23   < > 29 29 31   GLUCOSE 73   < > 128*   < > 85 126* 77  BUN 23   < > 19   < > 16 17 21   CREATININE 0.98   < > 0.91   < > 0.90 0.94 1.09*  CALCIUM  8.1*   < > 7.6*   < > 7.7* 8.7* 8.4*  PROT 4.7*  --  4.1*  --   --   --   --   ALBUMIN  2.1*  --  1.9*  --   --  3.5 3.0*  AST 23  --  30  --   --   --   --   ALT 13  --  12  --   --   --   --   ALKPHOS 58  --  57  --   --   --   --   BILITOT 0.6  --  0.8  --   --   --   --   GFRNONAA 55*   < > 60*   < > >60 58* 48*  ANIONGAP 13   < > 14   < > 8 14 9    < > = values in this interval not displayed.     Hematology Recent Labs  Lab 11/22/23 1203 11/23/23 0838  11/24/23 0556  WBC 7.9 5.9 8.2  RBC 3.23* 2.68* 2.91*  HGB 9.7* 8.2* 8.9*  HCT 29.3* 24.2* 26.5*  MCV 90.7 90.3 91.1  MCH 30.0 30.6 30.6  MCHC 33.1 33.9 33.6  RDW 15.5 15.6* 15.7*  PLT 365 308 312    BNP Recent Labs  Lab 11/20/23 0815 11/21/23 1143 11/24/23 0556  BNP 441.0* 212.0* 2,235.5*      Cardiac Studies   Cardiac Studies & Procedures   ______________________________________________________________________________________________  STRESS TESTS  PCV MYOCARDIAL PERFUSION WITH LEXISCAN  09/06/2018  Narrative Lexiscan  Myoview  Stress Test 09/06/2018: Resting EKG demonstrates normal sinus rhythm.  Left bundle branch block.  Stress EKG is non-diagnostic as it's a pharmacologic stress test. Nuclear images reveal left ventricle to be mildly dilated both in rest and stress images with end-diastolic volume of 138 mL.  The perfusion imaging study demonstrates a moderate sized inferior and inferolateral decreased uptake suggestive of soft tissue attenuation.  In addition there is a very small sized apical septal defect suggestive of ischemia. The left ventricle systolic function calculated by QGS was 24% with global hypokinesis. This represents a high risk study in view of low EF, clinical correlation recommended. Findings may represent non ischemic dilated cardiomyopathy.   ECHOCARDIOGRAM  ECHOCARDIOGRAM COMPLETE 11/20/2023  Narrative ECHOCARDIOGRAM REPORT    Patient Name:   Pamela Dunn Date of Exam: 11/20/2023 Medical Rec #:  [id number]     Height:       60.0 in Accession #:    [id number]    Weight:       144.6 lb Date of Birth:  [birth year]     BSA:          1.626 m Patient Age:    89 years      BP:           171/80 mmHg Patient Gender: F             HR:           63 bpm. Exam Location:  Inpatient  Procedure: 2D Echo, Cardiac Doppler and Color Doppler (Both Spectral and Color Flow Doppler were utilized during procedure).  Indications:    Pulmonary Embolus  I26.09  History:        Patient has prior history of Echocardiogram examinations, most recent 06/11/2021. CHF; Risk Factors:Dyslipidemia.  Sonographer:    Thea Norlander RCS Referring Phys: [phone] RONDELL A SMITH  IMPRESSIONS   1. Left ventricular ejection fraction, by estimation, is 45 to 50%. The left ventricle has mildly decreased function. The left ventricle demonstrates regional wall motion abnormalities. There is moderate asymmetric left ventricular hypertrophy of the basal-septal segment. Left ventricular diastolic parameters are indeterminate. Abnormal (paradoxical) septal motion, consistent with RV pacemaker 2. Right ventricular systolic function is normal. The right ventricular size is normal. 3. Moderate pleural effusion in the left lateral region. 4. The mitral valve is degenerative. Mild mitral valve regurgitation. No evidence of mitral stenosis. 5. Tricuspid valve regurgitation is moderate. 6. The aortic valve is tricuspid. Aortic valve regurgitation is not visualized. No aortic stenosis is present. 7. The inferior vena cava is normal in size with greater than 50% respiratory variability, suggesting right atrial pressure of 3 mmHg.  FINDINGS Left Ventricle: Left ventricular ejection fraction, by estimation, is 45 to 50%. The left ventricle has mildly decreased function. The left ventricle demonstrates regional wall motion abnormalities. The left ventricular internal cavity size was small. There is moderate asymmetric left ventricular hypertrophy of the basal-septal segment. Abnormal (paradoxical) septal motion, consistent with RV pacemaker. Left ventricular diastolic parameters are indeterminate.  Right Ventricle: The right ventricular size is normal. No increase in right ventricular wall thickness. Right ventricular systolic function is normal.  Left Atrium: Left atrial size was normal in size.  Right Atrium: Right atrial size was normal in size.  Pericardium: Trivial  pericardial effusion is present.  Mitral Valve: The mitral valve is degenerative in appearance. Mild mitral valve regurgitation. No evidence of mitral valve stenosis. MV  peak gradient, 4.3 mmHg. The mean mitral valve gradient is 2.0 mmHg.  Tricuspid Valve: The tricuspid valve is normal in structure. Tricuspid valve regurgitation is moderate.  Aortic Valve: The aortic valve is tricuspid. Aortic valve regurgitation is not visualized. No aortic stenosis is present. Aortic valve peak gradient measures 4.9 mmHg.  Pulmonic Valve: The pulmonic valve was not well visualized. Pulmonic valve regurgitation is trivial.  Aorta: The aortic root and ascending aorta are structurally normal, with no evidence of dilitation.  Venous: The inferior vena cava is normal in size with greater than 50% respiratory variability, suggesting right atrial pressure of 3 mmHg.  IAS/Shunts: The interatrial septum was not well visualized.  Additional Comments: There is a moderate pleural effusion in the left lateral region.   LEFT VENTRICLE PLAX 2D LVIDd:         3.50 cm   Diastology LVIDs:         2.60 cm   LV e' medial:  4.90 cm/s LV PW:         0.80 cm   LV e' lateral: 4.35 cm/s LV IVS:        1.00 cm LVOT diam:     1.90 cm LV SV:         51 LV SV Index:   31 LVOT Area:     2.84 cm   RIGHT VENTRICLE             IVC RV S prime:     10.10 cm/s  IVC diam: 1.50 cm TAPSE (M-mode): 2.0 cm  LEFT ATRIUM           Index        RIGHT ATRIUM           Index LA diam:      2.50 cm 1.54 cm/m   RA Area:     11.90 cm LA Vol (A4C): 23.5 ml 14.45 ml/m  RA Volume:   24.90 ml  15.31 ml/m AORTIC VALVE AV Area (Vmax): 2.28 cm AV Vmax:        111.00 cm/s AV Peak Grad:   4.9 mmHg LVOT Vmax:      89.10 cm/s LVOT Vmean:     54.500 cm/s LVOT VTI:       0.179 m  AORTA Ao Root diam: 2.90 cm Ao Asc diam:  3.40 cm  MITRAL VALVE MV Area VTI:  1.78 cm   SHUNTS MV Peak grad: 4.3 mmHg   Systemic VTI:  0.18 m MV Mean grad:  2.0 mmHg   Systemic Diam: 1.90 cm MV Vmax:      1.04 m/s MV Vmean:     70.8 cm/s  Lonni Nanas MD Electronically signed by Lonni Nanas MD Signature Date/Time: 11/20/2023/9:07:39 PM    Final          ______________________________________________________________________________________________          Assessment & Plan   Expand All Collapse All     Progress Note   Patient Name: Pamela Dunn Date of Encounter: 11/23/2023 Primary Cardiologist: Gordy Bergamo, MD    Subjective    Overnight weights have improved. Patient notes no symptoms.  Sat on the side of the bed this AM   Vital Signs          Vitals:    11/22/23 2309 11/23/23 0521 11/23/23 0549 11/23/23 0725  BP: (!) 96/55 (!) 117/53   (!) 117/55  Pulse: 63 63      Resp: 16 16   14  Temp: 97.7 F (36.5 C) 98.3 F (36.8 C)   97.9 F (36.6 C)  TempSrc: Oral Oral      SpO2: 96% 95%      Weight:     62 kg    Height:              Intake/Output Summary (Last 24 hours) at 11/23/2023 0818 Last data filed at 11/23/2023 0700    Gross per 24 hour  Intake 793.2 ml  Output 1450 ml  Net -656.8 ml         Filed Weights    11/20/23 1600 11/22/23 0454 11/23/23 0549  Weight: 65.6 kg 65.7 kg 62 kg      Physical Exam    GEN: No acute distress.   Neck: No JVD Cardiac: RRR, no murmurs, rubs, or gallops.  Respiratory: Clear to auscultation bilaterally. GI: Soft, nontender, non-distended  MS: No edema, Left arm is hot to touch.   Labs    Telemetry: Paced   Chemistry Last Labs         Recent Labs  Lab 11/20/23 1004 11/20/23 1016 11/21/23 1042 11/21/23 1700 11/22/23 1203  NA 121*   < > 120* 122* 120*  K 3.5   < > 4.1 3.8 4.0  CL 85*   < > 83* 83* 83*  CO2 23   < > 23 26 29   GLUCOSE 73   < > 128* 136* 85  BUN 23   < > 19 18 16   CREATININE 0.98   < > 0.91 0.91 0.90  CALCIUM  8.1*   < > 7.6* 7.7* 7.7*  PROT 4.7*  --  4.1*  --   --   ALBUMIN  2.1*  --  1.9*  --   --   AST 23  --  30   --   --   ALT 13  --  12  --   --   ALKPHOS 58  --  57  --   --   BILITOT 0.6  --  0.8  --   --   GFRNONAA 55*   < > 60* 60* >60  ANIONGAP 13   < > 14 13 8    < > = values in this interval not displayed.        Hematology Last Labs        Recent Labs  Lab 11/20/23 0815 11/20/23 1016 11/21/23 0511 11/22/23 1203  WBC 7.9  --  7.8 7.9  RBC 3.68*  --  3.41* 3.23*  HGB 11.4* 11.2* 10.5* 9.7*  HCT 33.5* 33.0* 30.1* 29.3*  MCV 91.0  --  88.3 90.7  MCH 31.0  --  30.8 30.0  MCHC 34.0  --  34.9 33.1  RDW 15.4  --  15.3 15.5  PLT 381  --  378 365        BNP Last Labs      Recent Labs  Lab 11/20/23 0815 11/21/23 1143  BNP 441.0* 212.0*        Cardiac Studies    Cardiac Studies & Procedures Objective  ______________________________________________________________________________________________     STRESS TESTS   PCV MYOCARDIAL PERFUSION WITH LEXISCAN  09/06/2018   Narrative Lexiscan  Myoview  Stress Test 09/06/2018: Resting EKG demonstrates normal sinus rhythm.  Left bundle branch block.  Stress EKG is non-diagnostic as it's a pharmacologic stress test. Nuclear images reveal left ventricle to be mildly dilated both in rest and stress images with end-diastolic volume of 138 mL.  The perfusion imaging study  demonstrates a moderate sized inferior and inferolateral decreased uptake suggestive of soft tissue attenuation.  In addition there is a very small sized apical septal defect suggestive of ischemia. The left ventricle systolic function calculated by QGS was 24% with global hypokinesis. This represents a high risk study in view of low EF, clinical correlation recommended. Findings may represent non ischemic dilated cardiomyopathy.   ECHOCARDIOGRAM   ECHOCARDIOGRAM COMPLETE 11/20/2023   Narrative ECHOCARDIOGRAM REPORT       Patient Name:   Pamela Dunn Date of Exam: 11/20/2023 Medical Rec #:  [id number]     Height:       60.0 in Accession #:    [id number]    Weight:        144.6 lb Date of Birth:  [birth year]     BSA:          1.626 m Patient Age:    89 years      BP:           171/80 mmHg Patient Gender: F             HR:           63 bpm. Exam Location:  Inpatient   Procedure: 2D Echo, Cardiac Doppler and Color Doppler (Both Spectral and Color Flow Doppler were utilized during procedure).   Indications:    Pulmonary Embolus I26.09   History:        Patient has prior history of Echocardiogram examinations, most recent 06/11/2021. CHF; Risk Factors:Dyslipidemia.   Sonographer:    Thea Norlander RCS Referring Phys: [phone] RONDELL A SMITH   IMPRESSIONS     1. Left ventricular ejection fraction, by estimation, is 45 to 50%. The left ventricle has mildly decreased function. The left ventricle demonstrates regional wall motion abnormalities. There is moderate asymmetric left ventricular hypertrophy of the basal-septal segment. Left ventricular diastolic parameters are indeterminate. Abnormal (paradoxical) septal motion, consistent with RV pacemaker 2. Right ventricular systolic function is normal. The right ventricular size is normal. 3. Moderate pleural effusion in the left lateral region. 4. The mitral valve is degenerative. Mild mitral valve regurgitation. No evidence of mitral stenosis. 5. Tricuspid valve regurgitation is moderate. 6. The aortic valve is tricuspid. Aortic valve regurgitation is not visualized. No aortic stenosis is present. 7. The inferior vena cava is normal in size with greater than 50% respiratory variability, suggesting right atrial pressure of 3 mmHg.   FINDINGS Left Ventricle: Left ventricular ejection fraction, by estimation, is 45 to 50%. The left ventricle has mildly decreased function. The left ventricle demonstrates regional wall motion abnormalities. The left ventricular internal cavity size was small. There is moderate asymmetric left ventricular hypertrophy of the basal-septal segment. Abnormal (paradoxical) septal  motion, consistent with RV pacemaker. Left ventricular diastolic parameters are indeterminate.   Right Ventricle: The right ventricular size is normal. No increase in right ventricular wall thickness. Right ventricular systolic function is normal.   Left Atrium: Left atrial size was normal in size.   Right Atrium: Right atrial size was normal in size.   Pericardium: Trivial pericardial effusion is present.   Mitral Valve: The mitral valve is degenerative in appearance. Mild mitral valve regurgitation. No evidence of mitral valve stenosis. MV peak gradient, 4.3 mmHg. The mean mitral valve gradient is 2.0 mmHg.   Tricuspid Valve: The tricuspid valve is normal in structure. Tricuspid valve regurgitation is moderate.   Aortic Valve: The aortic valve is tricuspid. Aortic valve regurgitation is not visualized. No aortic stenosis  is present. Aortic valve peak gradient measures 4.9 mmHg.   Pulmonic Valve: The pulmonic valve was not well visualized. Pulmonic valve regurgitation is trivial.   Aorta: The aortic root and ascending aorta are structurally normal, with no evidence of dilitation.   Venous: The inferior vena cava is normal in size with greater than 50% respiratory variability, suggesting right atrial pressure of 3 mmHg.   IAS/Shunts: The interatrial septum was not well visualized.   Additional Comments: There is a moderate pleural effusion in the left lateral region.     LEFT VENTRICLE PLAX 2D LVIDd:         3.50 cm   Diastology LVIDs:         2.60 cm   LV e' medial:  4.90 cm/s LV PW:         0.80 cm   LV e' lateral: 4.35 cm/s LV IVS:        1.00 cm LVOT diam:     1.90 cm LV SV:         51 LV SV Index:   31 LVOT Area:     2.84 cm     RIGHT VENTRICLE             IVC RV S prime:     10.10 cm/s  IVC diam: 1.50 cm TAPSE (M-mode): 2.0 cm   LEFT ATRIUM           Index        RIGHT ATRIUM           Index LA diam:      2.50 cm 1.54 cm/m   RA Area:     11.90 cm LA Vol (A4C):  23.5 ml 14.45 ml/m  RA Volume:   24.90 ml  15.31 ml/m AORTIC VALVE AV Area (Vmax): 2.28 cm AV Vmax:        111.00 cm/s AV Peak Grad:   4.9 mmHg LVOT Vmax:      89.10 cm/s LVOT Vmean:     54.500 cm/s LVOT VTI:       0.179 m   AORTA Ao Root diam: 2.90 cm Ao Asc diam:  3.40 cm   MITRAL VALVE MV Area VTI:  1.78 cm   SHUNTS MV Peak grad: 4.3 mmHg   Systemic VTI:  0.18 m MV Mean grad: 2.0 mmHg   Systemic Diam: 1.90 cm MV Vmax:      1.04 m/s MV Vmean:     70.8 cm/s   Lonni Nanas MD Electronically signed by Lonni Nanas MD Signature Date/Time: 11/20/2023/9:07:39 PM       Final             ______________________________________________________________________________________________              Assessment & Plan    Acute on Chronic HFreF - NYHA I, Hypervolemic; BNP is still eelvated - Anasarca with hyponatremia and hypertension - improving with IV diuretics -- on DOAC since 9/15  - long term consider PYP eval for ATTR-CA due to the above  Given significant, but improving hyponatremia, diuretics now managed by nephrology; I do agree with repeat IV lasix  today and potential PO transition tomorrow   CHD s/p PPM - Pacing no issues   Acute PE/DVT - as per primary   COVID-19- as per primary   PC saw 11/22/23 - no change in therapy     Will sign off at this time and arrange f/u with Dr. Godfrey team If additional diuretic questions return or  new issues arise happy to re-engage Discussed with DIL and patient; I suspect she may be exaggeration her functional capacity a bit   For questions or updates, please contact [facility] Please consult [email or web address] for contact info under Cardiology/STEMI.      Stanly Leavens, MD FASE [facility] Cardiologist [facility]  [address] [place], [address] [place], [place] [address] [phone]  10:30 AM

## 2023-11-24 NOTE — Discharge Instructions (Signed)
 Information on my medicine - ELIQUIS (apixaban)  This medication education was reviewed with me or my healthcare representative as part of my discharge preparation.  The pharmacist that spoke with me during my hospital stay was:  Carron Brazen, RPH  Why was Eliquis prescribed for you? Eliquis was prescribed for you to reduce the risk of forming blood clots that can cause a stroke if you have a medical condition called atrial fibrillation (a type of irregular heartbeat) OR to reduce the risk of a blood clots forming after orthopedic surgery.  What do You need to know about Eliquis ? Take your Eliquis TWICE DAILY - one tablet in the morning and one tablet in the evening with or without food.  It would be best to take the doses about the same time each day.  If you have difficulty swallowing the tablet whole please discuss with your pharmacist how to take the medication safely.  Take Eliquis exactly as prescribed by your doctor and DO NOT stop taking Eliquis without talking to the doctor who prescribed the medication.  Stopping may increase your risk of developing a new clot or stroke.  Refill your prescription before you run out.  After discharge, you should have regular check-up appointments with your healthcare provider that is prescribing your Eliquis.  In the future your dose may need to be changed if your kidney function or weight changes by a significant amount or as you get older.  What do you do if you miss a dose? If you miss a dose, take it as soon as you remember on the same day and resume taking twice daily.  Do not take more than one dose of ELIQUIS at the same time.  Important Safety Information A possible side effect of Eliquis is bleeding. You should call your healthcare provider right away if you experience any of the following: Bleeding from an injury or your nose that does not stop. Unusual colored urine (red or dark brown) or unusual colored stools (red or  black). Unusual bruising for unknown reasons. A serious fall or if you hit your head (even if there is no bleeding).  Some medicines may interact with Eliquis and might increase your risk of bleeding or clotting while on Eliquis. To help avoid this, consult your healthcare provider or pharmacist prior to using any new prescription or non-prescription medications, including herbals, vitamins, non-steroidal anti-inflammatory drugs (NSAIDs) and supplements.  This website has more information on Eliquis (apixaban): http://www.eliquis.com/eliquis/home

## 2023-11-24 NOTE — Plan of Care (Signed)

## 2023-11-25 DIAGNOSIS — G9341 Metabolic encephalopathy: Secondary | ICD-10-CM | POA: Diagnosis not present

## 2023-11-25 DIAGNOSIS — E871 Hypo-osmolality and hyponatremia: Secondary | ICD-10-CM | POA: Diagnosis not present

## 2023-11-25 DIAGNOSIS — U071 COVID-19: Secondary | ICD-10-CM | POA: Diagnosis not present

## 2023-11-25 DIAGNOSIS — I509 Heart failure, unspecified: Secondary | ICD-10-CM | POA: Diagnosis not present

## 2023-11-25 LAB — BASIC METABOLIC PANEL WITH GFR
Anion gap: 16 — ABNORMAL HIGH (ref 5–15)
BUN: 23 mg/dL (ref 8–23)
CO2: 29 mmol/L (ref 22–32)
Calcium: 8.8 mg/dL — ABNORMAL LOW (ref 8.9–10.3)
Chloride: 83 mmol/L — ABNORMAL LOW (ref 98–111)
Creatinine, Ser: 1.22 mg/dL — ABNORMAL HIGH (ref 0.44–1.00)
GFR, Estimated: 42 mL/min — ABNORMAL LOW (ref >60)
Glucose, Bld: 118 mg/dL — ABNORMAL HIGH (ref 70–99)
Potassium: 4.7 mmol/L (ref 3.5–5.1)
Sodium: 128 mmol/L — ABNORMAL LOW (ref 135–145)

## 2023-11-25 LAB — RENAL FUNCTION PANEL
Albumin: 3.4 g/dL — ABNORMAL LOW (ref 3.5–5.0)
Anion gap: 10 (ref 5–15)
BUN: 24 mg/dL — ABNORMAL HIGH (ref 8–23)
CO2: 28 mmol/L (ref 22–32)
Calcium: 8.5 mg/dL — ABNORMAL LOW (ref 8.9–10.3)
Chloride: 88 mmol/L — ABNORMAL LOW (ref 98–111)
Creatinine, Ser: 1.07 mg/dL — ABNORMAL HIGH (ref 0.44–1.00)
GFR, Estimated: 49 mL/min — ABNORMAL LOW (ref >60)
Glucose, Bld: 78 mg/dL (ref 70–99)
Phosphorus: 2.2 mg/dL — ABNORMAL LOW (ref 2.5–4.6)
Potassium: 4.9 mmol/L (ref 3.5–5.1)
Sodium: 126 mmol/L — ABNORMAL LOW (ref 135–145)

## 2023-11-25 LAB — CBC
HCT: 27.5 % — ABNORMAL LOW (ref 36.0–46.0)
Hemoglobin: 9.3 g/dL — ABNORMAL LOW (ref 12.0–15.0)
MCH: 30.8 pg (ref 26.0–34.0)
MCHC: 33.8 g/dL (ref 30.0–36.0)
MCV: 91.1 fL (ref 80.0–100.0)
Platelets: 267 K/uL (ref 150–400)
RBC: 3.02 MIL/uL — ABNORMAL LOW (ref 3.87–5.11)
RDW: 15.6 % — ABNORMAL HIGH (ref 11.5–15.5)
WBC: 8.3 K/uL (ref 4.0–10.5)
nRBC: 0 % (ref 0.0–0.2)

## 2023-11-25 LAB — CULTURE, BLOOD (ROUTINE X 2): Culture: NO GROWTH

## 2023-11-25 LAB — SODIUM: Sodium: 129 mmol/L — ABNORMAL LOW (ref 135–145)

## 2023-11-25 MED ORDER — ENSURE PLUS HIGH PROTEIN PO LIQD
237.0000 mL | Freq: Two times a day (BID) | ORAL | Status: DC
  Administered 2023-11-25 – 2023-11-26 (×3): 237 mL via ORAL

## 2023-11-25 MED ORDER — TOLVAPTAN 15 MG PO TABS
15.0000 mg | ORAL_TABLET | Freq: Once | ORAL | Status: AC
  Administered 2023-11-25: 15 mg via ORAL
  Filled 2023-11-25: qty 1

## 2023-11-25 MED ORDER — ALBUMIN HUMAN 25 % IV SOLN
25.0000 g | INTRAVENOUS | Status: AC
  Administered 2023-11-25: 25 g via INTRAVENOUS
  Filled 2023-11-25: qty 100

## 2023-11-25 NOTE — Progress Notes (Signed)
 [facility] Progress Note  Name: Pamela Dunn MRN: [id number] DOB: [birth year]  Chief Complaint:  Altered mental status  Subjective:  She had 1.2 liters UOP over 9/16 as well as one unmeasured urine void.  She feels a little better today.  Her son is at bedside.  She is able to respond to thirst.    Review of systems:     She denies any overt shortness of breath or chest pain  Still some cough  Denies n/v Mental status improved   ---------------- Background on consult:  ANNIAH GLICK is a 89 y.o. female with past medical history significant for hypertension, dyslipidemia, combined systolic and diastolic CHF, complete heart block status post pacemaker, GI bleed who was presented with altered mental status and fluid overload, seen as a consultation for the evaluation of hyponatremia.  The workup showed positive COVID-19 infection and chest x-ray consistent with progressive left pleural effusion.  The patient had CT angiogram done with PE without right heart strain and lung nodule.  Doppler study of breath consistent with acute DVT in right leg.  Treating with IV heparin .  The patient was diagnosed with acute on chronic CHF and was evaluated by cardiologist.  Started on furosemide  IV with uptitration of the dose. We are consulted for the evaluation of hyponatremia.  On admission she has serum sodium level of 121 which has gone up to 123.  Down to 120 today.  Serum creatinine level is stable at 0.90.  Potassium level is normal.  Urine lites showed urine sodium was 76 and urine osmolality 320.  TSH was elevated at 7.17. Today the patient had low blood pressure, systolic BP in the 60s.  Isordil /hydralazine  was discontinued.  Added hydrocortisone , midodrine  and albumin . The patient reported feeling fatigued and tired.  Also complaining of some abdominal discomfort.  She was accompanied by her son and daughter-in-law.  She is not normally on lasix  or any diuretic at home.  Palliative  care was consulted and per charting they will continue to follow her outpatient.    Intake/Output Summary (Last 24 hours) at 11/25/2023 1131 Last data filed at 11/25/2023 0300 Gross per 24 hour  Intake 460 ml  Output 1150 ml  Net -690 ml    Vitals:  Vitals:   11/25/23 0007 11/25/23 0453 11/25/23 0804 11/25/23 0840  BP: (!) 82/55 124/86 115/62 115/62  Pulse: 65 65 68 66  Resp: 14 17 15    Temp: 97.7 F (36.5 C) 98 F (36.7 C) 97.7 F (36.5 C)   TempSrc: Oral Oral Oral   SpO2: 97% 97% 95%   Weight:      Height:         Physical Exam:    General elderly female in bed in no acute distress HEENT normocephalic atraumatic extraocular movements intact sclera anicteric Neck supple trachea midline Lungs clear but reduced to auscultation bilaterally normal work of breathing at rest on room air   Heart S1S2 no rub Abdomen soft nontender nondistended Extremities no pitting edema appreciated Psych normal mood and affect Neuro alert and oriented to person, year, and location; conversant    Medications reviewed   Labs:     Latest Ref Rng & Units 11/25/2023    2:40 AM 11/24/2023    5:56 AM 11/23/2023    8:38 AM  BMP  Glucose 70 - 99 mg/dL 78  77  873   BUN 8 - 23 mg/dL 24  21  17    Creatinine 0.44 - 1.00  mg/dL 8.92  8.90  9.05   Sodium 135 - 145 mmol/L 126  127  128   Potassium 3.5 - 5.1 mmol/L 4.9  3.6  3.5   Chloride 98 - 111 mmol/L 88  87  85   CO2 22 - 32 mmol/L 28  31  29    Calcium  8.9 - 10.3 mg/dL 8.5  8.4  8.7      Assessment/Plan:    # Hyponatremia, hypervolemic, likely multifactorial etiology mainly due to CHF.  Urine lytes presumably after diuretics dose.  Hyponatremia may also be contributed by hypothyroidism, possible adrenal insufficiency given hypotension and may also have SIADH due to underlying lung nodule/respiratory viral infection.  AM cortisol elevated - Lasix  is ordered for 40 mg PO daily  - will give tolvaptan  15 mg po once today  - sodium now and every  8 hours x 2 additional checks - She is receiving midodrine ; on pred and previously rec'd hydrocortisone . - fluid restriction and monitor labs.     # Hypotension:  - Continue midodrine  and she is on steroids    # Covid 19  - Therapies and supportive care per primary team   # Acute on chronic CHF:  - Seen by cardiology - diuretics as above    # Acute DVT and PE: On eliquis  per primary team   Disposition - continue inpatient monitoring.  May be able to discharge as early as tomorrow if continues to improve from a sodium standpoint   Katheryn JAYSON Saba, MD 11/25/2023 11:56 AM

## 2023-11-25 NOTE — Progress Notes (Signed)
 Progress Note   Patient: Pamela Dunn FMW:[id number] DOB: [birth year] DOA: 11/20/2023     5 DOS: the patient was seen and examined on 11/25/2023   Brief hospital course: 89 year old female with HTN, HLP, combined systolic and diastolic CHF, complete heart block s/p pacemaker, GERD, recently discharged on 9/1 (admitted for GI bleed, EGD with nonbleeding gastric and duodenal ulcers), presented with confusion.  Per family, over the last few days, she was becoming more confused, not recognizing family.  On the morning of admission had caregivers noted that she got choked up on her medications, complained of abdominal pain, her left arm has been swollen and blue, previously has been treated with anticoagulation for DVT.  She also has been on Lasix  over the last couple of days.  No fevers or cough.   Chest x-ray showed progressive left pleural effusion. CTA chest showed small segmental pulmonary embolus in the right lower lobe, without significant signs of right heart strain, moderate volume left pleural effusion, lobulated nodule 1.4x 1.1 cm increased in size since December worrisome for neoplasm.  CT abdomen noted stable ductal dilation in the body of the pancreas with mild bilateral renal atrophy Sodium 121, BNP 441, troponin 60-51 Received Lasix  40 mg IV x 1 in ED and admitted for further workup  Assessment and Plan: Acute metabolic encephalopathy COVID-19 infection - Worsening confusion since leaving the hospital from the previous admission on 9/1.  Positive for COVID, no acute hypoxia, chest x-ray showed pleural effusion, no infiltrates and with acute hyponatremia.   - Continue delirium precautions, airborne precautions.   - Mental status improved and is at baseline this AM   Acute hyponatremia - Acute, sodium 121, suspect hypervolemic hyponatremia with anasarca, pleural effusions - Serum osmolarity 262, urine osmolarity 320, UNa 76 - Nephrology following, sodium down to 126, Nephrology  following, plan for lasix  daily with tolvaptan  15mg  qday   Acute on chronic combined systolic and diastolic CHF heart failure Left pleural effusion Severe hypoalbuminemia - 2D echo showed EF of 45 to 50%, diastolic parameters indeterminate, moderate asymmetric LVH of the basal septal segment, normal RV SF, moderate pleural effusion in the left atrial region - Received IV albumin  on 9/14, continue IV Lasix , negative balance of 3.5 L - Anasarca, upper extremities edema significantly improved   Hypotension - Continue midodrine , wean as tolerated - received 2 doses of stress dose steroids on 9/14     Acute small subsegmental pulmonary embolism Acute RLE DVT with history of DVT -CTA chest-single small subsegmental pulmonary embolism in the right lower lobe with no findings of right heart strain.  Venous Dopplers upper extremity showed no DVT or SVT  - Venous Dopplers lower extremity + acute DVT in the right common femoral vein and right femoral vein, no DVT in the left lower extremity - now on eliquis    Essential hypertension - BP soft, hold BiDil , Toprol -XL, continue IV Lasix  and midodrine       Normocytic anemia - No bleeding, H&H stable   Hypothyroidism TSH 7.726, free T4 1.46 - Continue levothyroxine  25 mcg, outpatient thyroid  levels in 4 to 6 weeks   History of GI bleed secondary to duodenal and gastric ulcer - discharged on 9/1 after having GI bleeding secondary to duodenal ulcers. - Continue Protonix  40 mg twice daily - Monitor for signs of bleeding.   Rheumatoid arthritis - Continue prednisone    Pulmonary nodule Acute on chronic.  Lobulated nodule measuring 1.4 x 1.1 increased in size since December of 2024 that is  concerning for malignancy.  Family note that they would not want this worked up any further.   Generalized debility, anasarca, hypoalbuminemia - With advanced age, comorbidities, frail, palliative medicine following for GOC - SLP evaluation, continue dysphagia  1 diet with thin liquids, fluid restriction   Pressure Injury Buttocks Mid;Lower Stage 1 , POA - Wound care per nursing      Subjective: Feeling well this AM. Eager to go home soon  Physical Exam: Vitals:   11/25/23 0453 11/25/23 0804 11/25/23 0840 11/25/23 1215  BP: 124/86 115/62 115/62 91/66  Pulse: 65 68 66   Resp: 17 15    Temp: 98 F (36.7 C) 97.7 F (36.5 C)  97.8 F (36.6 C)  TempSrc: Oral Oral  Oral  SpO2: 97% 95%    Weight:      Height:       General exam: Awake, laying in bed, in nad Respiratory system: Normal respiratory effort, no wheezing Cardiovascular system: regular rate, s1, s2 Gastrointestinal system: Soft, nondistended, positive BS Central nervous system: CN2-12 grossly intact, strength intact Extremities: Perfused, no clubbing Skin: Normal skin turgor, no notable skin lesions seen Psychiatry: Mood normal // affect normal  Data Reviewed:  Labs reviewed: Na 128, K 4.7, Cr 1.22  Family Communication: Pt in room, family at bedside  Disposition: Status is: Inpatient Remains inpatient appropriate because: severity of illness  Planned Discharge Destination: Home with Home Health    Author: Garnette Pelt, MD 11/25/2023 5:11 PM  For on call review [email or web address].

## 2023-11-25 NOTE — Progress Notes (Signed)
   Palliative Medicine Inpatient Follow Up Note HPI: 89 year old female with HTN, HLP, combined systolic and diastolic CHF, complete heart block s/p pacemaker, GERD, recently discharged on 9/1 (admitted for GI bleed, EGD with nonbleeding gastric and duodenal ulcers), presented with confusion. Identified to be covid positive.  Palliative care has been asked to support additional GOC conversations.   Today's Discussion 11/25/2023  *Please note that this is a verbal dictation therefore any spelling or grammatical errors are due to the Dragon [employer] One system interpretation.  Chart reviewed inclusive of vital signs, progress notes, laboratory results, and diagnostic images. Na 126. Has had good UOP.  Pamela Dunn is resting comfortably this morning on RA exemplifying to s/s of distress.   Patients son, [name] present at bedside, he shares that she has been resting peacefully and endorses no concerns this morning.   OP Palliative support will be through [facility].   Questions and concerns addressed/Palliative Support Provided.   Objective Assessment: Vital Signs Vitals:   11/25/23 0804 11/25/23 0840  BP: 115/62 115/62  Pulse: 68 66  Resp: 15   Temp: 97.7 F (36.5 C)   SpO2: 95%     Intake/Output Summary (Last 24 hours) at 11/25/2023 1210 Last data filed at 11/25/2023 1148 Gross per 24 hour  Intake 700 ml  Output 1150 ml  Net -450 ml   Last Weight  Most recent update: 11/24/2023  5:31 AM    Weight  61.6 kg (135 lb 12.9 oz)            Gen: Elderly Caucasian female chronically ill in appearance HEENT: moist mucous membranes CV: Regular rate and rhythm  PULM: On room air breathing is even and nonlabored ABD: soft/nontender  EXT: Swelling in all extremities and multiple areas of ecchymosis Neuro: Alert and oriented x2-3 --> forgetful  SUMMARY OF RECOMMENDATIONS   DNAR/DNI  MOST form completed  Advanced Directives will be scanned into [employer]  Allowing time for  outcomes  OP Palliative support through [facility]  The PMT will remain peripheral at this time ______________________________________________________________________________________ Rosaline Becton [facility] Palliative Medicine Team Team Cell Phone: [phone] Please utilize secure chat with additional questions, if there is no response within 30 minutes please call the above phone number  Time Spent: 25  Palliative Medicine Team providers are available by phone from 7am to 7pm daily and can be reached through the team cell phone.  Should this patient require assistance outside of these hours, please call the patient's attending physician.

## 2023-11-25 NOTE — Hospital Course (Signed)
 89 year old female with HTN, HLP, combined systolic and diastolic CHF, complete heart block s/p pacemaker, GERD, recently discharged on 9/1 (admitted for GI bleed, EGD with nonbleeding gastric and duodenal ulcers), presented with confusion.  Per family, over the last few days, she was becoming more confused, not recognizing family.  On the morning of admission had caregivers noted that she got choked up on her medications, complained of abdominal pain, her left arm has been swollen and blue, previously has been treated with anticoagulation for DVT.  She also has been on Lasix  over the last couple of days.  No fevers or cough.   Chest x-ray showed progressive left pleural effusion. CTA chest showed small segmental pulmonary embolus in the right lower lobe, without significant signs of right heart strain, moderate volume left pleural effusion, lobulated nodule 1.4x 1.1 cm increased in size since December worrisome for neoplasm.  CT abdomen noted stable ductal dilation in the body of the pancreas with mild bilateral renal atrophy Sodium 121, BNP 441, troponin 60-51 Received Lasix  40 mg IV x 1 in ED and admitted for further workup

## 2023-11-25 NOTE — Plan of Care (Signed)

## 2023-11-25 NOTE — Progress Notes (Signed)
  Pt's BP has been lower than usual this evening.  Last reading was 82/55 with MAP 64.  Pt is very resistant to even having BP checked due to pain with the BP cuffs and we have been checking it in her left lower leg.  Discussed with provider on call and will give Albumin  x 1. Glade Lee BSN RN CMSRN 11/25/2023, 12:53 AM

## 2023-11-26 ENCOUNTER — Other Ambulatory Visit (HOSPITAL_COMMUNITY): Payer: Self-pay

## 2023-11-26 DIAGNOSIS — I509 Heart failure, unspecified: Secondary | ICD-10-CM | POA: Diagnosis not present

## 2023-11-26 DIAGNOSIS — G9341 Metabolic encephalopathy: Secondary | ICD-10-CM | POA: Diagnosis not present

## 2023-11-26 DIAGNOSIS — U071 COVID-19: Secondary | ICD-10-CM | POA: Diagnosis not present

## 2023-11-26 DIAGNOSIS — E871 Hypo-osmolality and hyponatremia: Secondary | ICD-10-CM | POA: Diagnosis not present

## 2023-11-26 LAB — RENAL FUNCTION PANEL
Albumin: 3 g/dL — ABNORMAL LOW (ref 3.5–5.0)
Anion gap: 12 (ref 5–15)
BUN: 28 mg/dL — ABNORMAL HIGH (ref 8–23)
CO2: 30 mmol/L (ref 22–32)
Calcium: 8.9 mg/dL (ref 8.9–10.3)
Chloride: 90 mmol/L — ABNORMAL LOW (ref 98–111)
Creatinine, Ser: 1.2 mg/dL — ABNORMAL HIGH (ref 0.44–1.00)
GFR, Estimated: 43 mL/min — ABNORMAL LOW (ref >60)
Glucose, Bld: 84 mg/dL (ref 70–99)
Phosphorus: 2.4 mg/dL — ABNORMAL LOW (ref 2.5–4.6)
Potassium: 4.5 mmol/L (ref 3.5–5.1)
Sodium: 132 mmol/L — ABNORMAL LOW (ref 135–145)

## 2023-11-26 MED ORDER — APIXABAN (ELIQUIS) VTE STARTER PACK (10MG AND 5MG)
ORAL_TABLET | ORAL | 0 refills | Status: AC
  Filled 2023-11-26: qty 74, 28d supply, fill #0

## 2023-11-26 MED ORDER — MIDODRINE HCL 10 MG PO TABS
10.0000 mg | ORAL_TABLET | Freq: Three times a day (TID) | ORAL | 0 refills | Status: AC
  Filled 2023-11-26: qty 90, 30d supply, fill #0

## 2023-11-26 MED ORDER — FUROSEMIDE 40 MG PO TABS
40.0000 mg | ORAL_TABLET | Freq: Every day | ORAL | 0 refills | Status: AC
  Filled 2023-11-26: qty 30, 30d supply, fill #0

## 2023-11-26 NOTE — Progress Notes (Signed)
 [facility] Progress Note  Name: Pamela Dunn MRN: [id number] DOB: 02/13/1932  Chief Complaint:  Altered mental status  Subjective:  She had 1.6 liters UOP over 9/17.  Sodium improved with tolvaptan  once.  She feels ok today.  She is going to go home with 24 hour assistance.  Her son and daughter in law are at bedside.       Review of systems:      She denies any shortness of breath or chest pain  Still some cough but is better Denies n/v No confusion anymore   ---------------- Background on consult:  Pamela Dunn is a 89 y.o. female with past medical history significant for hypertension, dyslipidemia, combined systolic and diastolic CHF, complete heart block status post pacemaker, GI bleed who was presented with altered mental status and fluid overload, seen as a consultation for the evaluation of hyponatremia.  The workup showed positive COVID-19 infection and chest x-ray consistent with progressive left pleural effusion.  The patient had CT angiogram done with PE without right heart strain and lung nodule.  Doppler study of breath consistent with acute DVT in right leg.  Treating with IV heparin .  The patient was diagnosed with acute on chronic CHF and was evaluated by cardiologist.  Started on furosemide  IV with uptitration of the dose. We are consulted for the evaluation of hyponatremia.  On admission she has serum sodium level of 121 which has gone up to 123.  Down to 120 today.  Serum creatinine level is stable at 0.90.  Potassium level is normal.  Urine lites showed urine sodium was 76 and urine osmolality 320.  TSH was elevated at 7.17. Today the patient had low blood pressure, systolic BP in the 60s.  Isordil /hydralazine  was discontinued.  Added hydrocortisone , midodrine  and albumin . The patient reported feeling fatigued and tired.  Also complaining of some abdominal discomfort.  She was accompanied by her son and daughter-in-law.  She is not normally on lasix  or  any diuretic at home.  Palliative care was consulted and per charting they will continue to follow her outpatient.    Intake/Output Summary (Last 24 hours) at 11/26/2023 1140 Last data filed at 11/26/2023 0818 Gross per 24 hour  Intake 788 ml  Output 1600 ml  Net -812 ml    Vitals:  Vitals:   11/26/23 0400 11/26/23 0538 11/26/23 0735 11/26/23 1124  BP:  100/83 (!) 86/18 (!) 110/35  Pulse: 63 63  63  Resp: 18 20 (!) 22 19  Temp:  98.6 F (37 C) 97.8 F (36.6 C) 98.5 F (36.9 C)  TempSrc:  Oral Oral Oral  SpO2: 95% 96%  94%  Weight:      Height:         Physical Exam:     General elderly female in bed in no acute distress HEENT normocephalic atraumatic extraocular movements intact sclera anicteric Neck supple trachea midline Lungs clear but reduced to auscultation bilaterally normal work of breathing at rest on room air   Heart S1S2 no rub Abdomen soft nontender nondistended Extremities no pitting edema appreciated Psych normal mood and affect Neuro alert and oriented to person, year, and location; conversant    Medications reviewed   Labs:     Latest Ref Rng & Units 11/26/2023    2:45 AM 11/25/2023    6:48 PM 11/25/2023   12:58 PM  BMP  Glucose 70 - 99 mg/dL 84   881   BUN 8 - 23 mg/dL 28  23   Creatinine 0.44 - 1.00 mg/dL 8.79   8.77   Sodium 864 - 145 mmol/L 132  129  128   Potassium 3.5 - 5.1 mmol/L 4.5   4.7   Chloride 98 - 111 mmol/L 90   83   CO2 22 - 32 mmol/L 30   29   Calcium  8.9 - 10.3 mg/dL 8.9   8.8      Assessment/Plan:    # Hyponatremia, hypervolemic, likely multifactorial etiology mainly due to CHF.  Urine lytes presumably after diuretics dose.  Hyponatremia may also be contributed by hypothyroidism, possible adrenal insufficiency given hypotension and may also have SIADH due to underlying lung nodule/respiratory viral infection.  AM cortisol elevated.  Improved with tolvaptan  15 mg once on 9/17 - Will reduce lasix  to 40 mg PO MWF and I have  let her son and daughter in law know to give the lasix  on the off days if worsening shortness of breath, swelling, or weight gain  - She is receiving midodrine ; on pred and previously rec'd hydrocortisone . - fluid restriction and monitor labs.     # Hypotension:  - Continue midodrine  and she is on steroids per primary    # Covid 19  - Therapies and supportive care per primary team   # Acute on chronic CHF:  - Seen by cardiology - diuretics as above    # Acute DVT and PE: On eliquis  per primary team   Disposition - per primary team.  Nephrology will sign off.  Spoke with her family and they would like to follow-up with her primary care provider and then reach out to nephrology if needed, which is reasonable.  Follow-up with PCP within one week    Katheryn JAYSON Saba, MD 11/26/2023 12:00 PM

## 2023-11-26 NOTE — Discharge Summary (Signed)
 Physician Discharge Summary   Patient: Pamela Dunn MRN: [id number] DOB: January 12, 1932  Admit date:     11/20/2023  Discharge date: 11/26/23  Discharge Physician: Garnette Pelt   PCP: Gerome Brunet, DO   Recommendations at discharge:    Follow up with PCP in 1-2 weeks  Discharge Diagnoses: Principal Problem:   Encephalopathy due to COVID-19 virus Active Problems:   Advanced care planning/counseling discussion   Hyponatremia   Pleural effusion   Heart failure with preserved ejection fraction (HCC)   Acute pulmonary embolism (HCC)   DVT (deep venous thrombosis) (HCC)   Essential hypertension   Normocytic anemia   Hypothyroidism   PEPTIC ULCER DISEASE, HX OF   History of GI bleed   Rheumatoid arthritis (HCC)   Pulmonary nodule   DNR (do not resuscitate)  Resolved Problems:   * No resolved hospital problems. [facility] Course: 89 year old female with HTN, HLP, combined systolic and diastolic CHF, complete heart block s/p pacemaker, GERD, recently discharged on 9/1 (admitted for GI bleed, EGD with nonbleeding gastric and duodenal ulcers), presented with confusion.  Per family, over the last few days, she was becoming more confused, not recognizing family.  On the morning of admission had caregivers noted that she got choked up on her medications, complained of abdominal pain, her left arm has been swollen and blue, previously has been treated with anticoagulation for DVT.  She also has been on Lasix  over the last couple of days.  No fevers or cough.   Chest x-ray showed progressive left pleural effusion. CTA chest showed small segmental pulmonary embolus in the right lower lobe, without significant signs of right heart strain, moderate volume left pleural effusion, lobulated nodule 1.4x 1.1 cm increased in size since December worrisome for neoplasm.  CT abdomen noted stable ductal dilation in the body of the pancreas with mild bilateral renal atrophy Sodium 121, BNP 441, troponin  60-51 Received Lasix  40 mg IV x 1 in ED and admitted for further workup  Assessment and Plan: Acute metabolic encephalopathy COVID-19 infection - Worsening confusion since leaving the hospital from the previous admission on 9/1.  Positive for COVID, no acute hypoxia, chest x-ray showed pleural effusion, no infiltrates and with acute hyponatremia.   - Continue delirium precautions, airborne precautions.   - Mental status improved and is at baseline    Acute hyponatremia - Acute, sodium 121, suspect hypervolemic hyponatremia with anasarca, pleural effusions - Nephrology following, sodium did trend down to 126, later improved with lasix  and fluid restriction -Nephrology has since signed off   Acute on chronic combined systolic and diastolic CHF heart failure Left pleural effusion Severe hypoalbuminemia - 2D echo showed EF of 45 to 50%, diastolic parameters indeterminate, moderate asymmetric LVH of the basal septal segment, normal RV SF, moderate pleural effusion in the left atrial region - Anasarca, upper extremities edema significantly improved with diuresis   Hypotension - Continue midodrine , wean as tolerated - received 2 doses of stress dose steroids on 9/14     Acute small subsegmental pulmonary embolism Acute RLE DVT with history of DVT -CTA chest-single small subsegmental pulmonary embolism in the right lower lobe with no findings of right heart strain.  Venous Dopplers upper extremity showed no DVT or SVT  - Venous Dopplers lower extremity + acute DVT in the right common femoral vein and right femoral vein, no DVT in the left lower extremity - now on eliquis    Essential hypertension - BP soft, hold BiDil , Toprol -XL, continue IV  Lasix  and midodrine       Normocytic anemia - No bleeding, H&H stable   Hypothyroidism TSH 7.726, free T4 1.46 - Continue levothyroxine  25 mcg, outpatient thyroid  levels in 4 to 6 weeks   History of GI bleed secondary to duodenal and gastric  ulcer - discharged on 9/1 after having GI bleeding secondary to duodenal ulcers. - Continue Protonix  40 mg twice daily   Rheumatoid arthritis - Continue prednisone    Pulmonary nodule Acute on chronic.  Lobulated nodule measuring 1.4 x 1.1 increased in size since December of 2024 that is concerning for malignancy.  Family note that they would not want this worked up any further.   Generalized debility, anasarca, hypoalbuminemia - With advanced age, comorbidities, frail, palliative medicine following for GOC - SLP evaluation, clear for dysphagia 3 diet   Pressure Injury Buttocks Mid;Lower Stage 1 , POA - Wound care per nursing          Consultants: Nephrology Procedures performed:   Disposition: Home Diet recommendation:  Dysphagia type 3 thin Liquid DISCHARGE MEDICATION: Allergies as of 11/26/2023       Reactions   Coreg  [carvedilol ] Diarrhea        Medication List     STOP taking these medications    isosorbide -hydrALAZINE  20-37.5 MG tablet Commonly known as: BIDIL        TAKE these medications    ADULT SUPPOSITORY RE Place 1 Dose rectally daily as needed (Constipation).   Apixaban  Starter Pack (10mg  and 5mg ) Commonly known as: ELIQUIS  STARTER PACK Take as directed on package: start with two-5mg  tablets twice daily for 7 days. On day 8, switch to one-5mg  tablet twice daily.   cholecalciferol  1000 units tablet Commonly known as: VITAMIN D  Take 1,000 Units by mouth daily.   ezetimibe  10 MG tablet Commonly known as: ZETIA  Take 10 mg by mouth daily.   folic acid  1 MG tablet Commonly known as: FOLVITE  Take 1 mg by mouth 2 (two) times daily.   furosemide  40 MG tablet Commonly known as: LASIX  Take 1 tablet (40 mg total) by mouth daily. Start taking on: November 27, 2023 What changed:  medication strength how much to take when to take this reasons to take this   guaiFENesin  600 MG 12 hr tablet Commonly known as: MUCINEX  Take 600 mg by mouth 2  (two) times daily as needed for cough or to loosen phlegm.   levothyroxine  25 MCG tablet Commonly known as: SYNTHROID  Take 25 mcg by mouth daily.   metoprolol  succinate 25 MG 24 hr tablet Commonly known as: TOPROL -XL Take 1 tablet (25 mg total) by mouth daily.   midodrine  10 MG tablet Commonly known as: PROAMATINE  Take 1 tablet (10 mg total) by mouth 3 (three) times daily with meals.   OMEGA-3 FISH OIL PO Take 1 capsule by mouth in the morning and at bedtime.   omeprazole  40 MG capsule Commonly known as: PRILOSEC Take 1 capsule (40 mg total) by mouth 2 (two) times daily for 30 days, THEN 1 capsule (40 mg total) daily. Start taking on: November 09, 2023   polyethylene glycol 17 g packet Commonly known as: MIRALAX  / GLYCOLAX  Take 17 g by mouth daily as needed for moderate constipation.   predniSONE  5 MG tablet Commonly known as: DELTASONE  Take 5 mg by mouth daily.   PRESERVISION AREDS PO Take 1 Dose by mouth 2 (two) times daily.        Follow-up Information     [facility] Follow up.  Specialty: Home Health Services Why: Agency will call you to set up apt times Contact information: [address] [place] [address] [phone]         Gerome Brunet, DO Follow up.   Specialty: Family Medicine Why: Please follow up in a week. Contact information: [address] [place] [address] [phone]         Gerome Brunet, DO .   Specialty: Family Medicine Contact information: [address] [place] [address] [phone]                Discharge Exam: Fredricka Weights   11/22/23 0454 11/23/23 0549 11/24/23 0530  Weight: 65.7 kg 62 kg 61.6 kg   General exam: Awake, laying in bed, in nad Respiratory system: Normal respiratory effort, no wheezing Cardiovascular system: regular rate, s1, s2 Gastrointestinal system: Soft, nondistended, positive BS Central nervous system: CN2-12 grossly  intact, strength intact Extremities: Perfused, no clubbing Skin: Normal skin turgor, no notable skin lesions seen Psychiatry: Mood normal // no visual hallucinations   Condition at discharge: fair  The results of significant diagnostics from this hospitalization (including imaging, microbiology, ancillary and laboratory) are listed below for reference.   Imaging Studies: US  CHEST (PLEURAL EFFUSION) Result Date: 11/21/2023 CLINICAL DATA:  Limited ultrasound of the chest done prior to possible thoracentesis. EXAM: CHEST ULTRASOUND COMPARISON:  CTA Chest - 11/20/23 FINDINGS: There is only a small pleural effusion present with no window to allow for safe approach for thoracentesis. IMPRESSION: Small pleural effusion. No window to allow for safe approach for thoracentesis. Ultrasound performed by: Sherrilee Bal, PA-C Electronically Signed   By: JONETTA Faes M.D.   On: 11/21/2023 14:59   VAS US  LOWER EXTREMITY VENOUS (DVT) Result Date: 11/21/2023  Lower Venous DVT Study Patient Name:  Pamela Dunn  Date of Exam:   11/20/2023 Medical Rec #: [id number]      Accession #:    [id number] Date of Birth: [birth year]      Patient Gender: F Patient Age:   22 years Exam Location:  [facility] Procedure:      VAS US  LOWER EXTREMITY VENOUS (DVT) Referring Phys: MAXIMINO SHARPS --------------------------------------------------------------------------------  Indications: Pulmonary embolism.  Risk Factors: Confirmed PE. Limitations: Poor ultrasound/tissue interface, body habitus and patient pain tolerance, patient movement. Comparison Study: No prior studies. Performing Technologist: Cordella Gerome RVT  Examination Guidelines: A complete evaluation includes B-mode imaging, spectral Doppler, color Doppler, and power Doppler as needed of all accessible portions of each vessel. Bilateral testing is considered an integral part of a complete examination. Limited examinations for reoccurring indications may be performed as  noted. The reflux portion of the exam is performed with the patient in reverse Trendelenburg.  +---------+---------------+---------+-----------+----------+-------------------+ RIGHT    CompressibilityPhasicitySpontaneityPropertiesThrombus Aging      +---------+---------------+---------+-----------+----------+-------------------+ CFV      Partial        Yes      Yes                  Acute               +---------+---------------+---------+-----------+----------+-------------------+ SFJ      Full                                                             +---------+---------------+---------+-----------+----------+-------------------+  FV Prox  Partial        Yes      Yes                  Acute               +---------+---------------+---------+-----------+----------+-------------------+ FV Mid   Partial        No       No                   Acute               +---------+---------------+---------+-----------+----------+-------------------+ FV DistalPartial        No       No                   Acute               +---------+---------------+---------+-----------+----------+-------------------+ POP      Full           No       No                                       +---------+---------------+---------+-----------+----------+-------------------+ PTV                                                   Not well visualized +---------+---------------+---------+-----------+----------+-------------------+ PERO                                                  Not well visualized +---------+---------------+---------+-----------+----------+-------------------+   +---------+---------------+---------+-----------+----------+-------------------+ LEFT     CompressibilityPhasicitySpontaneityPropertiesThrombus Aging      +---------+---------------+---------+-----------+----------+-------------------+ CFV      Full           Yes      Yes                                       +---------+---------------+---------+-----------+----------+-------------------+ SFJ      Full                                                             +---------+---------------+---------+-----------+----------+-------------------+ FV Prox  Full                                                             +---------+---------------+---------+-----------+----------+-------------------+ FV Mid   Full                                                             +---------+---------------+---------+-----------+----------+-------------------+  FV Distal               Yes      Yes                                      +---------+---------------+---------+-----------+----------+-------------------+ PFV      Full                                                             +---------+---------------+---------+-----------+----------+-------------------+ POP      Full           Yes      Yes                                      +---------+---------------+---------+-----------+----------+-------------------+ PTV      Full                                                             +---------+---------------+---------+-----------+----------+-------------------+ PERO                                                  Not well visualized +---------+---------------+---------+-----------+----------+-------------------+     Summary: RIGHT: - Findings consistent with acute deep vein thrombosis involving the right common femoral vein, and right femoral vein.  - No cystic structure found in the popliteal fossa.  LEFT: - There is no evidence of deep vein thrombosis in the lower extremity. However, portions of this examination were limited- see technologist comments above.  - No cystic structure found in the popliteal fossa.  *See table(s) above for measurements and observations. Electronically signed by Fonda Rim on 11/21/2023 at 8:28:47 AM.    Final    ECHOCARDIOGRAM  COMPLETE Result Date: 11/20/2023    ECHOCARDIOGRAM REPORT   Patient Name:   Pamela Dunn Date of Exam: 11/20/2023 Medical Rec #:  [id number]     Height:       60.0 in Accession #:    [id number]    Weight:       144.6 lb Date of Birth:  1931-05-05     BSA:          1.626 m Patient Age:    89 years      BP:           171/80 mmHg Patient Gender: F             HR:           63 bpm. Exam Location:  Inpatient Procedure: 2D Echo, Cardiac Doppler and Color Doppler (Both Spectral and Color            Flow Doppler were utilized during procedure). Indications:    Pulmonary Embolus I26.09  History:        Patient has prior history of Echocardiogram examinations, most  recent 06/11/2021. CHF; Risk Factors:Dyslipidemia.  Sonographer:    Thea Norlander RCS Referring Phys: [phone] RONDELL A SMITH IMPRESSIONS  1. Left ventricular ejection fraction, by estimation, is 45 to 50%. The left ventricle has mildly decreased function. The left ventricle demonstrates regional wall motion abnormalities. There is moderate asymmetric left ventricular hypertrophy of the basal-septal segment. Left ventricular diastolic parameters are indeterminate. Abnormal (paradoxical) septal motion, consistent with RV pacemaker  2. Right ventricular systolic function is normal. The right ventricular size is normal.  3. Moderate pleural effusion in the left lateral region.  4. The mitral valve is degenerative. Mild mitral valve regurgitation. No evidence of mitral stenosis.  5. Tricuspid valve regurgitation is moderate.  6. The aortic valve is tricuspid. Aortic valve regurgitation is not visualized. No aortic stenosis is present.  7. The inferior vena cava is normal in size with greater than 50% respiratory variability, suggesting right atrial pressure of 3 mmHg. FINDINGS  Left Ventricle: Left ventricular ejection fraction, by estimation, is 45 to 50%. The left ventricle has mildly decreased function. The left ventricle demonstrates regional wall  motion abnormalities. The left ventricular internal cavity size was small. There is moderate asymmetric left ventricular hypertrophy of the basal-septal segment. Abnormal (paradoxical) septal motion, consistent with RV pacemaker. Left ventricular diastolic parameters are indeterminate. Right Ventricle: The right ventricular size is normal. No increase in right ventricular wall thickness. Right ventricular systolic function is normal. Left Atrium: Left atrial size was normal in size. Right Atrium: Right atrial size was normal in size. Pericardium: Trivial pericardial effusion is present. Mitral Valve: The mitral valve is degenerative in appearance. Mild mitral valve regurgitation. No evidence of mitral valve stenosis. MV peak gradient, 4.3 mmHg. The mean mitral valve gradient is 2.0 mmHg. Tricuspid Valve: The tricuspid valve is normal in structure. Tricuspid valve regurgitation is moderate. Aortic Valve: The aortic valve is tricuspid. Aortic valve regurgitation is not visualized. No aortic stenosis is present. Aortic valve peak gradient measures 4.9 mmHg. Pulmonic Valve: The pulmonic valve was not well visualized. Pulmonic valve regurgitation is trivial. Aorta: The aortic root and ascending aorta are structurally normal, with no evidence of dilitation. Venous: The inferior vena cava is normal in size with greater than 50% respiratory variability, suggesting right atrial pressure of 3 mmHg. IAS/Shunts: The interatrial septum was not well visualized. Additional Comments: There is a moderate pleural effusion in the left lateral region.  LEFT VENTRICLE PLAX 2D LVIDd:         3.50 cm   Diastology LVIDs:         2.60 cm   LV e' medial:  4.90 cm/s LV PW:         0.80 cm   LV e' lateral: 4.35 cm/s LV IVS:        1.00 cm LVOT diam:     1.90 cm LV SV:         51 LV SV Index:   31 LVOT Area:     2.84 cm  RIGHT VENTRICLE             IVC RV S prime:     10.10 cm/s  IVC diam: 1.50 cm TAPSE (M-mode): 2.0 cm LEFT ATRIUM            Index        RIGHT ATRIUM           Index LA diam:      2.50 cm 1.54 cm/m   RA Area:     11.90  cm LA Vol (A4C): 23.5 ml 14.45 ml/m  RA Volume:   24.90 ml  15.31 ml/m  AORTIC VALVE AV Area (Vmax): 2.28 cm AV Vmax:        111.00 cm/s AV Peak Grad:   4.9 mmHg LVOT Vmax:      89.10 cm/s LVOT Vmean:     54.500 cm/s LVOT VTI:       0.179 m  AORTA Ao Root diam: 2.90 cm Ao Asc diam:  3.40 cm MITRAL VALVE MV Area VTI:  1.78 cm   SHUNTS MV Peak grad: 4.3 mmHg   Systemic VTI:  0.18 m MV Mean grad: 2.0 mmHg   Systemic Diam: 1.90 cm MV Vmax:      1.04 m/s MV Vmean:     70.8 cm/s Lonni Nanas MD Electronically signed by Lonni Nanas MD Signature Date/Time: 11/20/2023/9:07:39 PM    Final    CT Angio Chest Pulmonary Embolism (PE) W or WO Contrast Result Date: 11/20/2023 CLINICAL DATA:  Pulmonary embolism (PE) suspected, high prob EXAM: CT ANGIOGRAPHY CHEST WITH CONTRAST TECHNIQUE: Multidetector CT imaging of the chest was performed using the standard protocol during bolus administration of intravenous contrast. Multiplanar CT image reconstructions and MIPs were obtained to evaluate the vascular anatomy. RADIATION DOSE REDUCTION: This exam was performed according to the departmental dose-optimization program which includes automated exposure control, adjustment of the mA and/or kV according to patient size and/or use of iterative reconstruction technique. CONTRAST:  75mL OMNIPAQUE  IOHEXOL  350 MG/ML SOLN COMPARISON:  09/14/2023, 03/06/2023 FINDINGS: Pulmonary Embolism: Single segmental pulmonary embolus within the right lower lobe. The RV-LV ratio is less than 1. No findings of right heart strain. Cardiovascular: No cardiomegaly or pericardial effusion.Left chest pacemaker/AICD with leads terminating in the right atrium and right ventricle.No aortic aneurysm. Diffuse aortic and dense multi-vessel coronary atherosclerosis. Mediastinum/Nodes: No mediastinal mass.Small hiatal hernia.No mediastinal, hilar, or  axillary lymphadenopathy. Lungs/Pleura: The midline trachea and bronchi are patent. Biapical pleuroparenchymal scarring. Moderate volume left pleural effusion, which may be partially loculated in the more superior aspects, with near complete compressive atelectasis of the left lower lobe. Small right pleural effusion. No pneumothorax. Unchanged posterior right lung apex nodules, measuring 4 mm and 6 mm respectively (axial 45 and 50). Lobular nodule in the posterior right upper lobe, measuring 1.4 x 1.1 cm (axial 62), increased in size in the interim, measuring 6 mm previously. Unchanged 4.5 mm anterobasal right lower lobe nodule (axial 106). Musculoskeletal: Diffuse osteopenia. No acute fracture or destructive bone lesion. Moderate osteoarthritis of the right glenohumeral joint. Left shoulder arthroplasty is anatomically aligned without dislocation. Multilevel degenerative disc disease of the spine. Upper Abdomen: No acute abnormality in the partially visualized upper abdomen. Review of the MIP images confirms the above findings. IMPRESSION: 1. Single small segmental pulmonary embolus in the right lower lobe. No findings of right heart strain. 2. Moderate volume left pleural effusion, which may be partially loculated in the more superior aspect. Dense consolidation filling the majority of the left lower lobe, favored to represent near complete compressive atelectasis. 3. Lobular nodule in the posterior right upper lobe (axial 62), measuring 1.4 x 1.1 cm. This has increased in size since March 06, 2023, worrisome for enlarging neoplasm. Critical Value/emergent results were called by telephone at the time of interpretation on 11/20/2023 at 11:05 am to provider [facility] , who verbally acknowledged these results. Aortic Atherosclerosis (ICD10-I70.0). Electronically Signed   By: Rogelia Myers M.D.   On: 11/20/2023 11:11   UE VENOUS DUPLEX (  7am - 7pm) Result Date: 11/20/2023 UPPER VENOUS STUDY  Patient  Name:  NATURE KUEKER  Date of Exam:   11/20/2023 Medical Rec #: [id number]      Accession #:    [id number] Date of Birth: 11-28-1931      Patient Gender: F Patient Age:   32 years Exam Location:  [facility] Procedure:      VAS US  UPPER EXTREMITY VENOUS DUPLEX Referring Phys: ELSIE BODY --------------------------------------------------------------------------------  Indications: Edema Other Indications: Recent hospitalization with LUE IV. Risk Factors: DVT LUE (02/04/2023). Limitations: Poor ultrasound/tissue interface and patient unable to cooperate due to altered mental status, diffuse subcutaneous edema. Comparison Study: Previous exam on 06/05/2023 was negative for DVT Performing Technologist: Ezzie Potters RVT, RDMS  Examination Guidelines: A complete evaluation includes B-mode imaging, spectral Doppler, color Doppler, and power Doppler as needed of all accessible portions of each vessel. Bilateral testing is considered an integral part of a complete examination. Limited examinations for reoccurring indications may be performed as noted.  Right Findings: +----------+------------+---------+-----------+----------+--------------------+ RIGHT     CompressiblePhasicitySpontaneousProperties      Summary        +----------+------------+---------+-----------+----------+--------------------+ Subclavian               Yes       Yes                   patent by                                                              color/doppler     +----------+------------+---------+-----------+----------+--------------------+  Left Findings: +----------+------------+---------+-----------+----------+--------------------+ LEFT      CompressiblePhasicitySpontaneousProperties      Summary        +----------+------------+---------+-----------+----------+--------------------+ IJV                      Yes       Yes                   patent by                                                               color/doppler     +----------+------------+---------+-----------+----------+--------------------+ Subclavian               Yes       Yes                   patent by                                                              color/doppler     +----------+------------+---------+-----------+----------+--------------------+ Axillary      Full       Yes       Yes                                   +----------+------------+---------+-----------+----------+--------------------+  Brachial      Full       Yes       Yes                                   +----------+------------+---------+-----------+----------+--------------------+ Radial                                                 Not visualized    +----------+------------+---------+-----------+----------+--------------------+ Ulnar                                                  Not visualized    +----------+------------+---------+-----------+----------+--------------------+ Cephalic                                               Not visualized    +----------+------------+---------+-----------+----------+--------------------+ Basilic       Full       Yes       Yes                                   +----------+------------+---------+-----------+----------+--------------------+  Summary:  Right: No evidence of thrombosis in the subclavian.  Left: Limited exam due to above stated limitations, negative for DVT and SVT in all areas visualized.  *See table(s) above for measurements and observations.  Diagnosing physician: Debby Robertson Electronically signed by Debby Robertson on 11/20/2023 at 11:05:18 AM.    Final    CT ABDOMEN PELVIS W CONTRAST Result Date: 11/20/2023 CLINICAL DATA:  Acute generalized abdominal pain. EXAM: CT ABDOMEN AND PELVIS WITH CONTRAST TECHNIQUE: Multidetector CT imaging of the abdomen and pelvis was performed using the standard protocol following bolus administration of intravenous  contrast. RADIATION DOSE REDUCTION: This exam was performed according to the departmental dose-optimization program which includes automated exposure control, adjustment of the mA and/or kV according to patient size and/or use of iterative reconstruction technique. CONTRAST:  75mL OMNIPAQUE  IOHEXOL  350 MG/ML SOLN COMPARISON:  November 07, 2023. FINDINGS: Hepatobiliary: No focal liver abnormality is seen. No gallstones, gallbladder wall thickening, or biliary dilatation. Pancreas: No acute inflammation is noted. Stable ductal dilatation is noted in body of pancreas. Spleen: Normal in size without focal abnormality. Adrenals/Urinary Tract: Adrenal glands appear normal. Mild bilateral renal atrophy is noted. Left renal cyst is noted. No hydronephrosis or renal obstruction is noted. Urinary bladder is unremarkable. Stomach/Bowel: The stomach is unremarkable. Status post appendectomy. No evidence of bowel obstruction or inflammation. Vascular/Lymphatic: Aortic atherosclerosis. No enlarged abdominal or pelvic lymph nodes. Reproductive: Uterus and bilateral adnexa are unremarkable. Other: No ascites or hernia is noted. Musculoskeletal: No acute or significant osseous findings. IMPRESSION: 1. Stable ductal dilatation is noted in body of pancreas. 2. Mild bilateral renal atrophy. 3. No acute abnormality seen in the abdomen or pelvis. 4. Aortic atherosclerosis. Aortic Atherosclerosis (ICD10-I70.0). Electronically Signed   By: Lynwood Landy Raddle M.D.   On: 11/20/2023 11:03   CT Head Wo Contrast Result Date: 11/20/2023 EXAM: CT HEAD WITHOUT CONTRAST  11/20/2023 10:48:00 AM TECHNIQUE: CT of the head was performed without the administration of intravenous contrast. Automated exposure control, iterative reconstruction, and/or weight based adjustment of the mA/kV was utilized to reduce the radiation dose to as low as reasonably achievable. COMPARISON: 01/06/2023 CLINICAL HISTORY: Memory loss; Mental status change, unknown cause. Pt  arrived from home with [facility]. Pt's aide called EMS after patient choked on her morning meds. On EMS arrival, pt had patent airway. Aide requesting pt be sent to ER for eval. Pt confused on arrival; c/o leg pain however cannot recall any injuries. FINDINGS: BRAIN AND VENTRICLES: Nonspecific hypoattenuation in the periventricular and subcortical white matter, most likely representing chronic microvascular ischemic changes. Mild generalized parenchymal volume loss. 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 ORBITS: Bilaterally lateral lens replacement. SINUSES: Complete opacification of the visualized left maxillary sinus. SOFT TISSUES AND SKULL: No acute soft tissue abnormality. No skull fracture. Bilateral mastoid effusions, left greater than right. Degenerative changes of the mandibular condyle, greater on the left. Cerclage wires noted along the posterior elements of C1 and C2. VASCULATURE: Atherosclerosis of the carotid siphons and intracranial vertebral arteries. IMPRESSION: 1. No acute intracranial abnormality. 2. Moderate chronic microvascular ischemic changes. 3. Mild generalized parenchymal volume loss without lobar specific atrophy. 4. 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 Electronically signed by: Donnice Mania MD 11/20/2023 10:59 AM EDT RP Workstation: [id number]   DG Chest Portable 1 View Result Date: 11/20/2023 CLINICAL DATA:  89 year old female with shortness of breath and possible aspiration. EXAM: PORTABLE CHEST 1 VIEW COMPARISON:  CTA abdomen 11/07/2023.  Portable chest 01/06/2023. FINDINGS: Portable AP view at 0641 hours. Advanced Calcified aortic atherosclerosis. Chronic left chest dual lead cardiac pacemaker. Stable cardiac size and mediastinal contours. Densely opacified left lung base, new since portable exam last year, and appears related to progressive left pleural effusion since the CTA last month (small layering effusion at  that time). No air bronchograms. No superimposed pneumothorax or pulmonary edema. Right lung appears stable, negative. Paucity of bowel gas in the upper abdomen. Stable visualized osseous structures. IMPRESSION: 1. Progressed Left pleural effusion since CTA last month, now moderate. 2. No other acute cardiopulmonary abnormality. Aortic Atherosclerosis (ICD10-I70.0). Electronically Signed   By: VEAR Hurst M.D.   On: 11/20/2023 06:58   CT ANGIO GI BLEED Result Date: 11/07/2023 EXAM: CTA ABDOMEN AND PELVIS WITH CONTRAST 11/07/2023 01:22:13 PM TECHNIQUE: CTA images of the abdomen and pelvis with intravenous contrast. Three-dimensional MIP/volume rendered formations were performed. Automated exposure control, iterative reconstruction, and/or weight based adjustment of the mA/kV was utilized to reduce the radiation dose to as low as reasonably achievable. COMPARISON: CT 01/06/2023 CLINICAL HISTORY: GI bleed. FINDINGS: VASCULATURE: Extensive calcified aortoiliac atheromatous plaque without aneurysm. Calcified plaque at the origin of the right renal artery resulting in short segment stenosis of at least moderate severity, patent distally. Duplicated left renal arteries, superior dominant, both patent. GI BLEED: No evidence of active GI bleed or acute GI pathology. AORTA: No acute finding. No abdominal aortic aneurysm. No dissection. CELIAC TRUNK: No acute finding. No occlusion or significant stenosis. SUPERIOR MESEMTRIC ARTERY: No acute finding. No occlusion or significant stenosis. INFERIOR MESENTERIC ARTERY: No acute finding. No occlusion or significant stenosis. RENAL ARTERIES: Calcified plaque at the origin of the right renal artery resulting in short segment stenosis of at least moderate severity, patent distally. Duplicated left renal arteries, superior dominant, both patent. ILIAC ARTERIES: No acute finding. Extensive calcified plaque. No occlusion or significant  stenosis. ABDOMEN/PELVIS: LOWER CHEST: New small left  pleural effusion with dependent consolidation/atelectasis posteriorly in the left lower lobe. There is some patchy opacities peripherally at the right lung base. Transvenous pacing leads partially visualized. LIVER: The liver is unremarkable. GALLBLADDER AND BILE DUCTS: Subcentimeter partially calcified gallstone in the dependent aspect of the nondilated gallbladder. No biliary ductal dilatation. SPLEEN: The spleen is unremarkable. PANCREAS: main duct dilatation in the pancreatic body, progressive since prior exam without discrete obstructing lesion or regional inflammatory changes. ADRENAL GLANDS: Bilateral adrenal glands demonstrate no acute abnormality. KIDNEYS, URETERS AND BLADDER: Stable left upper pole renal cortical cyst. No stones in the kidneys or ureters. No hydronephrosis. No perinephric or periureteral stranding. Urinary bladder is unremarkable. GI AND BOWEL: Anastomotic staple lines in the right lower quadrant. Small hiatal hernia. A few small diverticula near the descending/sigmoid junction without adjacent inflammatory change. No evidence of active GI bleed or acute GI pathology. REPRODUCTIVE: Reproductive organs are unremarkable. PERITONEUM AND RETROPERITONEUM: No ascites or free air. LYMPH NODES: No lymphadenopathy. BONES AND SOFT TISSUES: Bilateral hip DJD. Multilevel lumbar spondylitic change. No acute abnormality of the bones. No acute soft tissue abnormality. IMPRESSION: 1. No evidence of active GI bleed or acute GI pathology. 2. Main duct dilatation in the pancreatic body, progressive since prior exam without discrete obstructing lesion or regional inflammatory changes. Elective MRCP or ERCP may be useful for further evaluation. 3. New small left pleural effusion with dependent consolidation/atelectasis posteriorly in the left lower lobe. Patchy opacities peripherally at the right lung base. Electronically signed by: Dayne Hassell MD 11/07/2023 01:42 PM EDT RP Workstation: [id number]     Microbiology: Results for orders placed or performed during the hospital encounter of 11/20/23  SARS Coronavirus 2 by RT PCR (hospital order, performed in [facility] hospital lab) *cepheid single result test* Anterior Nasal Swab     Status: Abnormal   Collection Time: 11/20/23  6:35 AM   Specimen: Anterior Nasal Swab  Result Value Ref Range Status   SARS Coronavirus 2 by RT PCR POSITIVE (A) NEGATIVE Final    Comment: Performed at [facility], [address], [place], [place] [address]  Blood culture (routine x 2)     Status: Abnormal   Collection Time: 11/20/23  8:30 AM   Specimen: BLOOD LEFT FOREARM  Result Value Ref Range Status   Specimen Description BLOOD LEFT FOREARM  Final   Special Requests   Final    BOTTLES DRAWN AEROBIC AND ANAEROBIC Blood Culture results may not be optimal due to an inadequate volume of blood received in culture bottles   Culture  Setup Time   Final    GRAM POSITIVE COCCI IN CLUSTERS AEROBIC BOTTLE ONLY CRITICAL RESULT CALLED TO, READ BACK BY AND VERIFIED WITH: PHARMD JIMMY W [id number] FCP    Culture (A)  Final    STAPHYLOCOCCUS EPIDERMIDIS THE SIGNIFICANCE OF ISOLATING THIS ORGANISM FROM A SINGLE SET OF BLOOD CULTURES WHEN MULTIPLE SETS ARE DRAWN IS UNCERTAIN. PLEASE NOTIFY THE MICROBIOLOGY DEPARTMENT WITHIN ONE WEEK IF SPECIATION AND SENSITIVITIES ARE REQUIRED. Performed at [facility], [address] [place], [place] [address]    Report Status 11/23/2023 FINAL  Final  Blood Culture ID Panel (Reflexed)     Status: Abnormal   Collection Time: 11/20/23  8:30 AM  Result Value Ref Range Status   Enterococcus faecalis NOT DETECTED NOT DETECTED Final   Enterococcus Faecium NOT DETECTED NOT DETECTED Final   Listeria monocytogenes NOT DETECTED NOT DETECTED  Final   Staphylococcus species DETECTED (A) NOT DETECTED Final    Comment: CRITICAL RESULT CALLED TO, READ BACK BY AND VERIFIED WITH: PHARMD JIMMY W [id number] FCP    Staphylococcus  aureus (BCID) NOT DETECTED NOT DETECTED Final   Staphylococcus epidermidis DETECTED (A) NOT DETECTED Final    Comment: Methicillin (oxacillin) resistant coagulase negative staphylococcus. Possible blood culture contaminant (unless isolated from more than one blood culture draw or clinical case suggests pathogenicity). No antibiotic treatment is indicated for blood  culture contaminants. CRITICAL RESULT CALLED TO, READ BACK BY AND VERIFIED WITH: PHARMD JIMMY W [id number] FCP    Staphylococcus lugdunensis NOT DETECTED NOT DETECTED Final   Streptococcus species NOT DETECTED NOT DETECTED Final   Streptococcus agalactiae NOT DETECTED NOT DETECTED Final   Streptococcus pneumoniae NOT DETECTED NOT DETECTED Final   Streptococcus pyogenes NOT DETECTED NOT DETECTED Final   A.calcoaceticus-baumannii NOT DETECTED NOT DETECTED Final   Bacteroides fragilis NOT DETECTED NOT DETECTED Final   Enterobacterales NOT DETECTED NOT DETECTED Final   Enterobacter cloacae complex NOT DETECTED NOT DETECTED Final   Escherichia coli NOT DETECTED NOT DETECTED Final   Klebsiella aerogenes NOT DETECTED NOT DETECTED Final   Klebsiella oxytoca NOT DETECTED NOT DETECTED Final   Klebsiella pneumoniae NOT DETECTED NOT DETECTED Final   Proteus species NOT DETECTED NOT DETECTED Final   Salmonella species NOT DETECTED NOT DETECTED Final   Serratia marcescens NOT DETECTED NOT DETECTED Final   Haemophilus influenzae NOT DETECTED NOT DETECTED Final   Neisseria meningitidis NOT DETECTED NOT DETECTED Final   Pseudomonas aeruginosa NOT DETECTED NOT DETECTED Final   Stenotrophomonas maltophilia NOT DETECTED NOT DETECTED Final   Candida albicans NOT DETECTED NOT DETECTED Final   Candida auris NOT DETECTED NOT DETECTED Final   Candida glabrata NOT DETECTED NOT DETECTED Final   Candida krusei NOT DETECTED NOT DETECTED Final   Candida parapsilosis NOT DETECTED NOT DETECTED Final   Candida tropicalis NOT DETECTED NOT DETECTED Final    Cryptococcus neoformans/gattii NOT DETECTED NOT DETECTED Final   Methicillin resistance mecA/C DETECTED (A) NOT DETECTED Final    Comment: CRITICAL RESULT CALLED TO, READ BACK BY AND VERIFIED WITH: MAYA RUTHA ORN [phone] FCP Performed at [facility], [address] [place], [place] [address]   Blood culture (routine x 2)     Status: None   Collection Time: 11/20/23  8:46 AM   Specimen: BLOOD  Result Value Ref Range Status   Specimen Description BLOOD SITE NOT SPECIFIED  Final   Special Requests   Final    BOTTLES DRAWN AEROBIC AND ANAEROBIC Blood Culture results may not be optimal due to an inadequate volume of blood received in culture bottles   Culture   Final    NO GROWTH 5 DAYS Performed at [facility], [address], [place], [place] [address]    Report Status 11/25/2023 FINAL  Final  MRSA Next Gen by PCR, Nasal     Status: None   Collection Time: 11/23/23  9:25 AM   Specimen: Nasal Mucosa; Nasal Swab  Result Value Ref Range Status   MRSA by PCR Next Gen NOT DETECTED NOT DETECTED Final    Comment: (NOTE) The GeneXpert MRSA Assay (FDA approved for NASAL specimens only), is one component of a comprehensive MRSA colonization surveillance program. It is not intended to diagnose MRSA infection nor to guide or monitor treatment for MRSA infections. Test performance is not FDA approved in patients less than 2  years old. Performed at [facility], [address] [place], [place] [address]     Labs: CBC: Recent Labs  Lab 11/21/23 [phone] 11/22/23 1203 11/23/23 0838 11/24/23 0556 11/25/23 0240  WBC 7.8 7.9 5.9 8.2 8.3  HGB 10.5* 9.7* 8.2* 8.9* 9.3*  HCT 30.1* 29.3* 24.2* 26.5* 27.5*  MCV 88.3 90.7 90.3 91.1 91.1  PLT 378 365 308 312 267   Basic Metabolic Panel: Recent Labs  Lab 11/23/23 0838 11/24/23 0556 11/25/23 0240 11/25/23 1258 11/25/23 1848 11/26/23 0245  NA 128* 127* 126* 128* 129* 132*  K 3.5 3.6 4.9 4.7  --  4.5  CL 85* 87* 88*  83*  --  90*  CO2 29 31 28 29   --  30  GLUCOSE 126* 77 78 118*  --  84  BUN 17 21 24* 23  --  28*  CREATININE 0.94 1.09* 1.07* 1.22*  --  1.20*  CALCIUM  8.7* 8.4* 8.5* 8.8*  --  8.9  PHOS 2.6 2.1* 2.2*  --   --  2.4*   Liver Function Tests: Recent Labs  Lab 11/20/23 1004 11/21/23 1042 11/23/23 0838 11/24/23 0556 11/25/23 0240 11/26/23 0245  AST 23 30  --   --   --   --   ALT 13 12  --   --   --   --   ALKPHOS 58 57  --   --   --   --   BILITOT 0.6 0.8  --   --   --   --   PROT 4.7* 4.1*  --   --   --   --   ALBUMIN  2.1* 1.9* 3.5 3.0* 3.4* 3.0*   CBG: No results for input(s): GLUCAP in the last 168 hours.  Discharge time spent: less than 30 minutes.  Signed: Garnette Pelt, MD [facility] 11/26/2023

## 2023-11-26 NOTE — Care Management Important Message (Signed)
 Important Message  Patient Details  Name: Pamela Dunn MRN: [id number] Date of Birth: 12-03-1931   Important Message Given:  Yes - Medicare IM     Pamela Dunn 11/26/2023, 11:36 AM

## 2023-11-26 NOTE — TOC Transition Note (Addendum)
 Transition of Care [facility]) - Discharge Note   Patient Details  Name: Pamela Dunn MRN: [id number] Date of Birth: [birth year]  [facility] CM/SW Contact:  Waddell Barnie Rama, RN Phone Number: 11/26/2023, 3:11 PM   Clinical Narrative:    For dc today, she will need ambulance transport,  NCM notified [place] with [employer].  PTAR has been scheduled.     Barriers to Discharge: Continued Medical Work up   Patient Goals and CMS Choice Patient states their goals for this hospitalization and ongoing recovery are:: retrun home   Choice offered to / list presented to : Adult Children [place] ownership interest in [facility].provided to:: Adult Children    Discharge Placement                       Discharge Plan and Services Additional resources added to the After Visit Summary for     Discharge Planning Services: CM Consult Post Acute Care Choice: Home Health          DME Arranged: N/A DME Agency: NA       HH Arranged: RN, PT, OT HH Agency: [facility] Date [facility] Agency Contacted: 11/23/23 Time HH Agency Contacted: 1245 Representative spoke with at [facility] Agency: Darleene  Social Drivers of Health (SDOH) Interventions SDOH Screenings   Food Insecurity: No Food Insecurity (11/21/2023)  Housing: Low Risk  (11/21/2023)  Transportation Needs: No Transportation Needs (11/21/2023)  Utilities: Not At Risk (11/21/2023)  Social Connections: Unknown (11/21/2023)  Recent Concern: Social Connections - Socially Isolated (11/21/2023)  Tobacco Use: Low Risk  (11/20/2023)     Readmission Risk Interventions    11/23/2023   12:29 PM  Readmission Risk Prevention Plan  Transportation Screening Complete  PCP or Specialist Appt within 3-5 Days Complete  HRI or Home Care Consult Complete  Palliative Care Screening Complete  Medication Review (RN Care Manager) Complete

## 2023-11-26 NOTE — Evaluation (Signed)
 Clinical/Bedside Swallow Evaluation Patient Details  Name: Pamela Dunn MRN: [id number] Date of Birth: 1931/11/10  Today's Date: 11/26/2023 Time: SLP Start Time (ACUTE ONLY): 0856 SLP Stop Time (ACUTE ONLY): 0912 SLP Time Calculation (min) (ACUTE ONLY): 16 min  Past Medical History:  Past Medical History:  Diagnosis Date   Allergy    seasonal   Anemia    Arthritis    Encounter for care of pacemaker 07/12/2020   GERD (gastroesophageal reflux disease)    Heart block AV complete (HCC) 07/11/2020   Hyperlipidemia    Hypertension    Pacemaker Abbott Assurity MRI model [id number] 07/12/2020 07/12/2020   Past Surgical History:  Past Surgical History:  Procedure Laterality Date   APPENDECTOMY     BACK SURGERY     BOWEL RESECTION N/A 08/24/2019   Procedure: SMALL BOWEL RESECTION;  Surgeon: Vernetta Berg, MD;  Location: [facility] ORS;  Service: General;  Laterality: N/A;   ESOPHAGOGASTRODUODENOSCOPY N/A 11/08/2023   Procedure: EGD (ESOPHAGOGASTRODUODENOSCOPY);  Surgeon: Rollin Dover, MD;  Location: [facility] ENDOSCOPY;  Service: Gastroenterology;  Laterality: N/A;   LAPAROSCOPY N/A 08/24/2019   Procedure: LAPAROSCOPY DIAGNOSTIC;  Surgeon: Vernetta Berg, MD;  Location: [facility] ORS;  Service: General;  Laterality: N/A;   LAPAROTOMY N/A 08/24/2019   Procedure: EXPLORATORY LAPAROTOMY;  Surgeon: Vernetta Berg, MD;  Location: [facility] ORS;  Service: General;  Laterality: N/A;   PACEMAKER IMPLANT N/A 07/12/2020   Procedure: PACEMAKER IMPLANT;  Surgeon: Kelsie Agent, MD;  Location: [facility] INVASIVE CV LAB;  Service: Cardiovascular;  Laterality: N/A;   HPI:  Patient is a 89 y.o. female with medical history significant of hypertension, hyperlipidemia, combined systolic and diastolic CHF, complete heart block s/p PPM, GERD who presents due to worsening confusion and recently getting choked up on her pills. History obtained mostly from her son. [name] noted that over the last few days she had become more and more  confused. Pt was seen for a cognitive-linguistic evaluation on 9/16.  Pt with a hx of dysphagia in 2021 with baseline diet Dysphagia 3 solids and thin liquids per chart review.    Assessment / Plan / Recommendation  Clinical Impression  Pt was seen for a clinical swallow evaluation and presents with mild oral dysphagia with suspected functional pharyngeal phase of the swallow.  Pt exhibited mildly prolonged mastication of regular solids (likely secondary to dentition), but no overt s/sx of aspiration were observed with PO trials during this evaluation.  Recommend diet upgrade to baseline diet of Dysphagia 3 (soft) solids and thin liquids with medication administered whole in puree (cut/crush large pills).  SLP will briefly f/u to monitor diet tolerance.  SLP Visit Diagnosis: Dysphagia, oral phase (R13.11)    Aspiration Risk  Mild aspiration risk    Diet Recommendation Dysphagia 3 (Mech soft);Thin liquid    Liquid Administration via: Cup;Straw Medication Administration: Whole meds with puree Supervision: Staff to assist with self feeding Compensations: Minimize environmental distractions;Slow rate;Small sips/bites Postural Changes: Seated upright at 90 degrees    Other  Recommendations Oral Care Recommendations: Oral care BID;Staff/trained caregiver to provide oral care     Assistance Recommended at Discharge    Functional Status Assessment Patient has had a recent decline in their functional status and demonstrates the ability to make significant improvements in function in a reasonable and predictable amount of time.  Frequency and Duration min 2x/week  2 weeks       Prognosis Prognosis for improved oropharyngeal function: Good Barriers to Reach Goals: Cognitive deficits  Swallow Study   General Date of Onset: 11/26/23 HPI: Patient is a 89 y.o. female with medical history significant of hypertension, hyperlipidemia, combined systolic and diastolic CHF, complete heart block  s/p PPM, GERD who presents due to worsening confusion and recently getting choked up on her pills. History obtained mostly from her son. [name] noted that over the last few days she had become more and more confused. Pt was seen for a cognitive-linguistic evaluation on 9/16.  Pt with a hx of dysphagia in 2021 with baseline diet Dysphagia 3 solids and thin liquids per chart review. Type of Study: Bedside Swallow Evaluation Previous Swallow Assessment: See HPI Diet Prior to this Study: Dysphagia 1 (pureed);Thin liquids (Level 0) Temperature Spikes Noted: No Respiratory Status: Room air History of Recent Intubation: No Behavior/Cognition: Alert;Cooperative;Pleasant mood Oral Cavity Assessment: Within Functional Limits Oral Care Completed by SLP: No Oral Cavity - Dentition: Missing dentition;Dentures, not available;Poor condition Vision: Functional for self-feeding Self-Feeding Abilities: Needs assist Patient Positioning: Upright in bed Baseline Vocal Quality: Normal Volitional Swallow: Able to elicit    Oral/Motor/Sensory Function Overall Oral Motor/Sensory Function: Within functional limits   Ice Chips Ice chips: Not tested   Thin Liquid Thin Liquid: Within functional limits Presentation: Straw    Nectar Thick Nectar Thick Liquid: Not tested   Honey Thick Honey Thick Liquid: Not tested   Puree Puree: Within functional limits Presentation: Spoon   Solid     Solid: Within functional limits Presentation: Spoon     Pamela Dunn, M.S., CCC-SLP Acute Rehabilitation Services Office: [phone]  Pamela SQUIBB Pamela Dunn 11/26/2023,9:27 AM

## 2023-11-26 NOTE — Progress Notes (Signed)
 Physical Therapy Treatment Patient Details Name: Pamela Dunn MRN: [id number] DOB: 1931-07-02 Today's Date: 11/26/2023   History of Present Illness 89 yo F adm 11/20/23 with AMS, Covid +, PE of RLL, RLE DVT, pulmonary nodule. PMHx: CHF, CHB s/p PPM, CKD, ICM, HLD, RA    PT Comments  Pt very pleasant and willing to attempt mobility. Pt with decreased assist for bed mobility this session but continues to requires max+2 to brief standing trials limited by pain in UB and LB with weight bearing. Pt educated for bed level HEP and reports being comfortable with return home with caregivers, son and daughter present. Will continue to follow with HHPT appropriate.    If plan is discharge home, recommend the following: A lot of help with walking and/or transfers;A lot of help with bathing/dressing/bathroom;Assist for transportation;Assistance with cooking/housework;Direct supervision/assist for medications management   Can travel by private vehicle        Equipment Recommendations  Hospital bed;Hoyer lift    Recommendations for Other Services       Precautions / Restrictions Precautions Precautions: Fall Recall of Precautions/Restrictions: Impaired Precaution/Restrictions Comments: painful arms     Mobility  Bed Mobility Overal bed mobility: Needs Assistance Bed Mobility: Rolling, Supine to Sit, Sit to Supine Rolling: Min assist   Supine to sit: Mod assist Sit to supine: Mod assist   General bed mobility comments: min assist to roll bil for pad change, Mod assist to pivot to EOB and elevate trunk as well as swing LB back into bed. Max +2 assist to slide toward [facility]    Transfers Overall transfer level: Needs assistance     Sit to Stand: Max assist, +2 physical assistance           General transfer comment: physical assist of pad under sacrum with pt holding onto therapists to stand briefly 3x from EOB. Pt benefits from encouragement and reassurance. Pt able to take short steps  toward HOB x 2 trials    Ambulation/Gait               General Gait Details: unable   Stairs             Wheelchair Mobility     Tilt Bed    Modified Rankin (Stroke Patients Only)       Balance Overall balance assessment: Needs assistance Sitting-balance support: Feet supported, No upper extremity supported Sitting balance-Leahy Scale: Fair Sitting balance - Comments: initial posterior bias with progression to midline, CGA   Standing balance support: Bilateral upper extremity supported, During functional activity Standing balance-Leahy Scale: Poor Standing balance comment: UB support, knees blocked, pad at sacrum                            Communication Communication Communication: No apparent difficulties  Cognition Arousal: Alert Behavior During Therapy: WFL for tasks assessed/performed   PT - Cognitive impairments: Memory, Awareness, Difficult to assess, Problem solving, Safety/Judgement                         Following commands: Intact      Cueing Cueing Techniques: Verbal cues, Gestural cues  Exercises General Exercises - Lower Extremity Heel Slides: AROM, Both, 5 reps, Supine, Strengthening Hip ABduction/ADduction: AAROM, Both, 10 reps, Supine, Strengthening Straight Leg Raises: AROM, Both, 10 reps, Supine, Strengthening    General Comments        Pertinent Vitals/Pain Pain Assessment Pain Assessment:  Faces Faces Pain Scale: Hurts even more Pain Location: LB and arms with movement and transfers Pain Descriptors / Indicators: Aching, Sore, Tender Pain Intervention(s): Limited activity within patient's tolerance, Monitored during session, Repositioned    Home Living                          Prior Function            PT Goals (current goals can now be found in the care plan section) Progress towards PT goals: Progressing toward goals    Frequency    Min 1X/week      PT Plan       Co-evaluation              AM-PAC PT 6 Clicks Mobility   Outcome Measure  Help needed turning from your back to your side while in a flat bed without using bedrails?: A Little Help needed moving from lying on your back to sitting on the side of a flat bed without using bedrails?: A Lot Help needed moving to and from a bed to a chair (including a wheelchair)?: Total Help needed standing up from a chair using your arms (e.g., wheelchair or bedside chair)?: Total Help needed to walk in hospital room?: Total Help needed climbing 3-5 steps with a railing? : Total 6 Click Score: 9    End of Session   Activity Tolerance: Patient tolerated treatment well Patient left: in bed;with call bell/phone within reach;with family/visitor present Nurse Communication: Mobility status PT Visit Diagnosis: Other abnormalities of gait and mobility (R26.89);Difficulty in walking, not elsewhere classified (R26.2);Muscle weakness (generalized) (M62.81)     Time: 8873-8850 PT Time Calculation (min) (ACUTE ONLY): 23 min  Charges:    $Therapeutic Exercise: 8-22 mins $Therapeutic Activity: 8-22 mins PT General Charges $$ ACUTE PT VISIT: 1 Visit                     Lenoard SQUIBB, PT Acute Rehabilitation Services Office: [phone]    Lenoard NOVAK Elizabethann Lackey 11/26/2023, 12:46 PM

## 2023-12-05 NOTE — Progress Notes (Signed)
 Remote PPM Transmission

## 2023-12-06 NOTE — Progress Notes (Signed)
 " Cardiology Office Note    Patient Name: Pamela Dunn Date of Encounter: 12/06/2023  Primary Care Provider:  Gerome Brunet, DO Primary Cardiologist:  Gordy Bergamo, MD Primary Electrophysiologist: Fonda Kitty, MD   Past Medical History    Past Medical History:  Diagnosis Date   Allergy    seasonal   Anemia    Arthritis    Encounter for care of pacemaker 07/12/2020   GERD (gastroesophageal reflux disease)    Heart block AV complete (HCC) 07/11/2020   Hyperlipidemia    Hypertension    Pacemaker Abbott Assurity MRI model [id number] 07/12/2020 07/12/2020    History of Present Illness  Pamela Dunn is a 89 y.o. female with a PMH of HTN, chronic combined CHF HLD, CHB s/p dual-chamber PPM 07/2020, CKD stage IIIa, and ICM, LBBB, rheumatoid arthritis who presents today for posthospital follow-up.   Pamela Dunn was last seen on 06/17/23 for 2-week follow-up.  During her visit she reported some residual swelling in her arm that was responding well to elevating with a pillow.  She had recently discontinued Eliquis  and was encouraged to continue current BP medications due to stable BP.  She was recently admitted and treated for GI bleed with hospitalization from 11/07/2023 to 11/09/2023.  She was noted to be hypotensive on arrival and was treated with fluid bolus and 2 units of PRBC.  She had FOBT that was positive for GI bleed.  Hemoglobin was 7.5 prior to transfusion and was 11.1 posttransfusion.  She was evaluated by GI and underwent a EGD by Dr. Rollin on 11/08/2023 and was found to have a nonbleeding gastric and duodenal ulcer.  She was discharged with Protonix  40 mg.  She was also noted to have mild metabolic acidosis and was discharged with 10 days of sodium bicarb. She was readmitted on 11/20/2023 with GI bleed and worsening confusion.  In the ED she was mildly tachypneic with BP of 154/86 BNP of 441.  She also tested positive for COVID.  She completed a chest x-ray that showed left pleural effusion and CT  scan of the head was completed with no abnormalities.  She had a CT of the chest completed that showed a small segmental PE in the right lower lobe.  She was started on a Lasix  drip and 2D echo 11/20/23 TTE with LVEF 45-50%, abnormal septal motion consistent with RV PPM. Patient with anasarca, left side pleural effusion.  She was also noted to be hyponatremic with Lasix  and fluid restriction.  Eliquis  for right lower extremity DVT BP was noted to be soft with BiDil  and Toprol -XL held with continuation of Lasix  and midodrine .  She was evaluated by palliative care for GOC.  She was discharged on 11/26/2023 to home.  Pamela Dunn presents today with her son and daughter for follow-up. Since discharge, she has not felt better. Her blood pressure was low during the hospital stay. She experiences dizziness, particularly in the mornings, confirmed by her caregiver.She has a history of gastric ulcer and was previously on Protonix , which has been switched to omeprazole . She reports ongoing stomach pain, which has been persistent. An upper GI study during her hospitalization revealed gastric erosion. She is on Eliquis , and her caregiver noted blood in her stool recently, which is concerning given her past hospitalization for a GI bleed. Her blood sugar levels are reportedly stable. She has a pacemaker with stable reports and no abnormalities on readouts.  During today's visit her blood pressure was stable at 112/64. In  terms of hydration and nutrition, she is drinking water and soft drinks but reports difficulty with drinking certain juices. No fever or chills, but she reports coughing up clear fluid.  She is scheduled to be evaluated by palliative care and family declined any additional treatments or blood work today.  Patient denies chest pain, palpitations, dyspnea, PND, orthopnea.   Discussed the use of AI scribe software for clinical note transcription with the patient, who gave verbal consent to proceed.  History of  Present Illness   Review of Systems  Please see the history of present illness.    All other systems reviewed and are otherwise negative except as noted above.  Physical Exam    Wt Readings from Last 3 Encounters:  11/24/23 135 lb 12.9 oz (61.6 kg)  11/07/23 134 lb 14.7 oz (61.2 kg)  04/08/23 131 lb (59.4 kg)   CD:Uyzmz were no vitals filed for this visit.,There is no height or weight on file to calculate BMI. GEN: Critically ill in no acute distress Neck: No JVD; No carotid bruits Pulmonary: Clear to auscultation without rales, wheezing or rhonchi  Cardiovascular: Normal rate. Regular rhythm. Normal S1. Normal S2.   Murmurs: There is no murmur.  ABDOMEN: Soft, non-tender, non-distended EXTREMITIES: Bilateral +1 pitting edema swollen left upper extremity +2  EKG/LABS/ Recent Cardiac Studies   ECG personally reviewed by me today -none completed today  Risk Assessment/Calculations:          Lab Results  Component Value Date   WBC 8.3 11/25/2023   HGB 9.3 (L) 11/25/2023   HCT 27.5 (L) 11/25/2023   MCV 91.1 11/25/2023   PLT 267 11/25/2023   Lab Results  Component Value Date   CREATININE 1.20 (H) 11/26/2023   BUN 28 (H) 11/26/2023   NA 132 (L) 11/26/2023   K 4.5 11/26/2023   CL 90 (L) 11/26/2023   CO2 30 11/26/2023   No results found for: CHOL, HDL, LDLCALC, LDLDIRECT, TRIG, CHOLHDL  No results found for: HGBA1C Assessment & Plan    Assessment & Plan  1.  Chronic combined CHF: - Admission for acute on chronic CHF in the setting of COVID-19 with diuresis and improvement to AKI and hyponatremia. -Patient was discharged with Lasix  - Coughing up clear fluid, likely due to Lasix  use. - Continue Lasix  as prescribed. - Monitor for changes in fluid color or consistency.  2.  Acute PE/DVT: - Previous history of DVT with new small PE in right lower lobe and treated with IV heparin  -Left upper extremity singly swollen and immobile due to chronic  arthritis - Venous Doppler showing no DVT in upper extremities with Eliquis  restarted - Continue Eliquis  5 mg twice daily x 7 days  3.  Hypotension: - BP was soft in hospital with BiDil , Toprol -XL held - Patient's blood pressure today is stable at 112/64 - Continue midodrine  10 mg 3 times daily  4.  History of CHB: -s/p PPM placement on 07/12/2020 with Abbott Assurity device due to complete heart block -Normal Paceart report and histograms  5.  History of GI bleed:  Recent hematochezia raises concern for potential bleeding recurrence. Blood work needed to check blood counts however declined by family due to palliative care referral  Continue omeprazole .  6.  Palliative care patient : -Patient's family reports that she will be consulted by hospice starting tomorrow -Further interventions or treatment at this time were deferred by family. - Will continue to offer comfort care as needed with no new recommendations or  treatment at this time.  Disposition: Follow-up with Gordy Bergamo, MD as needed    Signed, Wyn Raddle, Jackee Shove, NP 12/06/2023, 12:01 PM [facility] "

## 2023-12-07 ENCOUNTER — Encounter: Payer: Self-pay | Admitting: Nurse Practitioner

## 2023-12-07 ENCOUNTER — Ambulatory Visit: Attending: Nurse Practitioner | Admitting: Nurse Practitioner

## 2023-12-07 VITALS — BP 112/64 | HR 75 | Ht 60.0 in | Wt 135.0 lb

## 2023-12-07 DIAGNOSIS — I10 Essential (primary) hypertension: Secondary | ICD-10-CM

## 2023-12-07 DIAGNOSIS — Z950 Presence of cardiac pacemaker: Secondary | ICD-10-CM

## 2023-12-07 DIAGNOSIS — Z515 Encounter for palliative care: Secondary | ICD-10-CM

## 2023-12-07 DIAGNOSIS — I5022 Chronic systolic (congestive) heart failure: Secondary | ICD-10-CM

## 2023-12-07 DIAGNOSIS — Z8719 Personal history of other diseases of the digestive system: Secondary | ICD-10-CM | POA: Diagnosis not present

## 2023-12-07 DIAGNOSIS — I82A12 Acute embolism and thrombosis of left axillary vein: Secondary | ICD-10-CM

## 2023-12-07 NOTE — Patient Instructions (Signed)
 Medication Instructions:  Your physician recommends that you continue on your current medications as directed. Please refer to the Current Medication list given to you today. *If you need a refill on your cardiac medications before your next appointment, please call your pharmacy*  Lab Work: None ordered If you have labs (blood work) drawn today and your tests are completely normal, you will receive your results only by: MyChart Message (if you have MyChart) OR A paper copy in the mail If you have any lab test that is abnormal or we need to change your treatment, we will call you to review the results.  Testing/Procedures: None ordered  Follow-Up: At [facility], you and your health needs are our priority.  As part of our continuing mission to provide you with exceptional heart care, our providers are all part of one team.  This team includes your primary Cardiologist (physician) and Advanced Practice Providers or APPs (Physician Assistants and Nurse Practitioners) who all work together to provide you with the care you need, when you need it.  Your next appointment:   Follow up as needed   Provider:   Gordy Bergamo, MD    We recommend signing up for the patient portal called MyChart.  Sign up information is provided on this After Visit Summary.  MyChart is used to connect with patients for Virtual Visits (Telemedicine).  Patients are able to view lab/test results, encounter notes, upcoming appointments, etc.  Non-urgent messages can be sent to your provider as well.   To learn more about what you can do with MyChart, go to [email or web address].   Other Instructions

## 2023-12-25 ENCOUNTER — Other Ambulatory Visit (HOSPITAL_COMMUNITY): Payer: Self-pay

## 2024-01-05 ENCOUNTER — Ambulatory Visit: Admitting: Gastroenterology

## 2024-01-14 ENCOUNTER — Ambulatory Visit (INDEPENDENT_AMBULATORY_CARE_PROVIDER_SITE_OTHER): Payer: Medicare PPO

## 2024-01-14 DIAGNOSIS — I5022 Chronic systolic (congestive) heart failure: Secondary | ICD-10-CM

## 2024-01-14 LAB — CUP PACEART REMOTE DEVICE CHECK
Battery Remaining Longevity: 76 mo
Battery Remaining Percentage: 69 %
Battery Voltage: 3.01 V
Brady Statistic AP VP Percent: 44 %
Brady Statistic AP VS Percent: 1 %
Brady Statistic AS VP Percent: 55 %
Brady Statistic AS VS Percent: 1 %
Brady Statistic RA Percent Paced: 43 %
Brady Statistic RV Percent Paced: 99 %
Date Time Interrogation Session: 20251106020013
Implantable Lead Connection Status: 753985
Implantable Lead Connection Status: 753985
Implantable Lead Implant Date: 20220505
Implantable Lead Implant Date: 20220505
Implantable Lead Location: 753859
Implantable Lead Location: 753860
Implantable Pulse Generator Implant Date: 20220505
Lead Channel Impedance Value: 350 Ohm
Lead Channel Impedance Value: 550 Ohm
Lead Channel Pacing Threshold Amplitude: 0.625 V
Lead Channel Pacing Threshold Amplitude: 0.75 V
Lead Channel Pacing Threshold Pulse Width: 0.5 ms
Lead Channel Pacing Threshold Pulse Width: 0.5 ms
Lead Channel Sensing Intrinsic Amplitude: 12 mV
Lead Channel Sensing Intrinsic Amplitude: 2.5 mV
Lead Channel Setting Pacing Amplitude: 1.625
Lead Channel Setting Pacing Amplitude: 2 V
Lead Channel Setting Pacing Pulse Width: 0.5 ms
Lead Channel Setting Sensing Sensitivity: 2 mV
Pulse Gen Model: 2272
Pulse Gen Serial Number: 3920272

## 2024-01-15 NOTE — Progress Notes (Signed)
 Remote PPM Transmission

## 2024-01-16 ENCOUNTER — Ambulatory Visit: Payer: Self-pay | Admitting: Cardiology

## 2024-01-23 DIAGNOSIS — I4891 Unspecified atrial fibrillation: Secondary | ICD-10-CM | POA: Diagnosis not present

## 2024-01-23 DIAGNOSIS — I509 Heart failure, unspecified: Secondary | ICD-10-CM | POA: Diagnosis not present

## 2024-01-23 DIAGNOSIS — I442 Atrioventricular block, complete: Secondary | ICD-10-CM | POA: Diagnosis not present

## 2024-01-23 DIAGNOSIS — D6869 Other thrombophilia: Secondary | ICD-10-CM | POA: Diagnosis not present

## 2024-01-23 DIAGNOSIS — N1832 Chronic kidney disease, stage 3b: Secondary | ICD-10-CM | POA: Diagnosis not present

## 2024-01-23 DIAGNOSIS — I429 Cardiomyopathy, unspecified: Secondary | ICD-10-CM | POA: Diagnosis not present

## 2024-01-23 DIAGNOSIS — J449 Chronic obstructive pulmonary disease, unspecified: Secondary | ICD-10-CM | POA: Diagnosis not present

## 2024-01-23 DIAGNOSIS — I739 Peripheral vascular disease, unspecified: Secondary | ICD-10-CM | POA: Diagnosis not present

## 2024-01-23 DIAGNOSIS — I130 Hypertensive heart and chronic kidney disease with heart failure and stage 1 through stage 4 chronic kidney disease, or unspecified chronic kidney disease: Secondary | ICD-10-CM | POA: Diagnosis not present

## 2024-01-29 ENCOUNTER — Telehealth: Payer: Self-pay | Admitting: Cardiology

## 2024-01-29 NOTE — Telephone Encounter (Signed)
 Caller Mar) called to report patient is now in hospice care.

## 2024-03-26 ENCOUNTER — Other Ambulatory Visit: Payer: Self-pay | Admitting: Cardiology

## 2024-04-14 ENCOUNTER — Ambulatory Visit

## 2024-04-15 LAB — CUP PACEART REMOTE DEVICE CHECK
Battery Remaining Longevity: 74 mo
Battery Remaining Percentage: 67 %
Battery Voltage: 2.99 V
Brady Statistic AP VP Percent: 42 %
Brady Statistic AP VS Percent: 1 %
Brady Statistic AS VP Percent: 57 %
Brady Statistic AS VS Percent: 1 %
Brady Statistic RA Percent Paced: 41 %
Brady Statistic RV Percent Paced: 99 %
Date Time Interrogation Session: 20260205020024
Implantable Lead Connection Status: 753985
Implantable Lead Connection Status: 753985
Implantable Lead Implant Date: 20220505
Implantable Lead Implant Date: 20220505
Implantable Lead Location: 753859
Implantable Lead Location: 753860
Implantable Pulse Generator Implant Date: 20220505
Lead Channel Impedance Value: 390 Ohm
Lead Channel Impedance Value: 580 Ohm
Lead Channel Pacing Threshold Amplitude: 0.5 V
Lead Channel Pacing Threshold Amplitude: 0.75 V
Lead Channel Pacing Threshold Pulse Width: 0.5 ms
Lead Channel Pacing Threshold Pulse Width: 0.5 ms
Lead Channel Sensing Intrinsic Amplitude: 1.7 mV
Lead Channel Sensing Intrinsic Amplitude: 12 mV
Lead Channel Setting Pacing Amplitude: 1.5 V
Lead Channel Setting Pacing Amplitude: 2 V
Lead Channel Setting Pacing Pulse Width: 0.5 ms
Lead Channel Setting Sensing Sensitivity: 2 mV
Pulse Gen Model: 2272
Pulse Gen Serial Number: 3920272

## 2024-07-14 ENCOUNTER — Ambulatory Visit

## 2024-10-13 ENCOUNTER — Ambulatory Visit

## 2025-01-12 ENCOUNTER — Ambulatory Visit

## 2025-04-13 ENCOUNTER — Ambulatory Visit

## 2025-07-13 ENCOUNTER — Ambulatory Visit
